# Patient Record
Sex: Male | Born: 1945
Health system: Southern US, Community
[De-identification: ages and names within clinical notes are randomized; demographics above are authoritative.]

## PROBLEM LIST (undated history)

## (undated) DIAGNOSIS — I2510 Atherosclerotic heart disease of native coronary artery without angina pectoris: Secondary | ICD-10-CM

## (undated) DIAGNOSIS — I219 Acute myocardial infarction, unspecified: Secondary | ICD-10-CM

## (undated) DIAGNOSIS — Z9889 Other specified postprocedural states: Secondary | ICD-10-CM

## (undated) DIAGNOSIS — C4490 Unspecified malignant neoplasm of skin, unspecified: Secondary | ICD-10-CM

## (undated) DIAGNOSIS — R112 Nausea with vomiting, unspecified: Secondary | ICD-10-CM

## (undated) DIAGNOSIS — N529 Male erectile dysfunction, unspecified: Secondary | ICD-10-CM

## (undated) DIAGNOSIS — C689 Malignant neoplasm of urinary organ, unspecified: Secondary | ICD-10-CM

## (undated) DIAGNOSIS — C7951 Secondary malignant neoplasm of bone: Secondary | ICD-10-CM

## (undated) DIAGNOSIS — I10 Essential (primary) hypertension: Secondary | ICD-10-CM

## (undated) DIAGNOSIS — Z8719 Personal history of other diseases of the digestive system: Secondary | ICD-10-CM

## (undated) DIAGNOSIS — J189 Pneumonia, unspecified organism: Secondary | ICD-10-CM

## (undated) DIAGNOSIS — C61 Malignant neoplasm of prostate: Secondary | ICD-10-CM

## (undated) DIAGNOSIS — G4730 Sleep apnea, unspecified: Secondary | ICD-10-CM

## (undated) DIAGNOSIS — M1990 Unspecified osteoarthritis, unspecified site: Secondary | ICD-10-CM

## (undated) DIAGNOSIS — R011 Cardiac murmur, unspecified: Secondary | ICD-10-CM

## (undated) DIAGNOSIS — I499 Cardiac arrhythmia, unspecified: Secondary | ICD-10-CM

## (undated) DIAGNOSIS — E7800 Pure hypercholesterolemia, unspecified: Secondary | ICD-10-CM

## (undated) HISTORY — DX: Malignant neoplasm of prostate: C61

## (undated) HISTORY — DX: Male erectile dysfunction, unspecified: N52.9

## (undated) HISTORY — DX: Pure hypercholesterolemia, unspecified: E78.00

## (undated) HISTORY — PX: THYROID SURGERY: SHX805

## (undated) HISTORY — DX: Unspecified malignant neoplasm of skin, unspecified: C44.90

## (undated) HISTORY — PX: WISDOM TOOTH EXTRACTION: SHX21

---

## 2005-04-20 HISTORY — PX: CATARACT EXTRACTION, BILATERAL: SHX1313

## 2005-11-10 ENCOUNTER — Encounter: Admission: RE | Admit: 2005-11-10 | Discharge: 2005-11-10 | Payer: Self-pay | Admitting: Family Medicine

## 2006-04-20 DIAGNOSIS — C4490 Unspecified malignant neoplasm of skin, unspecified: Secondary | ICD-10-CM

## 2006-04-20 HISTORY — PX: OTHER SURGICAL HISTORY: SHX169

## 2006-04-20 HISTORY — DX: Unspecified malignant neoplasm of skin, unspecified: C44.90

## 2009-06-19 ENCOUNTER — Encounter: Admission: RE | Admit: 2009-06-19 | Discharge: 2009-06-19 | Payer: Self-pay | Admitting: Family Medicine

## 2011-01-23 ENCOUNTER — Other Ambulatory Visit: Payer: Self-pay | Admitting: Family Medicine

## 2011-01-23 DIAGNOSIS — R070 Pain in throat: Secondary | ICD-10-CM

## 2011-02-02 ENCOUNTER — Other Ambulatory Visit: Payer: Self-pay

## 2011-02-12 ENCOUNTER — Other Ambulatory Visit: Payer: Self-pay | Admitting: Family Medicine

## 2011-02-12 ENCOUNTER — Ambulatory Visit
Admission: RE | Admit: 2011-02-12 | Discharge: 2011-02-12 | Disposition: A | Discharge status: Home / Self Care | Payer: BC Managed Care – PPO | Source: Ambulatory Visit | Attending: Family Medicine | Admitting: Family Medicine

## 2011-02-12 DIAGNOSIS — J45909 Unspecified asthma, uncomplicated: Secondary | ICD-10-CM

## 2011-03-27 DIAGNOSIS — C61 Malignant neoplasm of prostate: Secondary | ICD-10-CM

## 2011-03-27 HISTORY — DX: Malignant neoplasm of prostate: C61

## 2011-04-20 ENCOUNTER — Encounter: Payer: Self-pay | Admitting: *Deleted

## 2011-04-20 DIAGNOSIS — E7800 Pure hypercholesterolemia, unspecified: Secondary | ICD-10-CM | POA: Insufficient documentation

## 2011-04-20 DIAGNOSIS — E785 Hyperlipidemia, unspecified: Secondary | ICD-10-CM | POA: Insufficient documentation

## 2011-04-20 DIAGNOSIS — C4490 Unspecified malignant neoplasm of skin, unspecified: Secondary | ICD-10-CM | POA: Insufficient documentation

## 2011-04-20 DIAGNOSIS — N529 Male erectile dysfunction, unspecified: Secondary | ICD-10-CM | POA: Insufficient documentation

## 2011-04-20 NOTE — Progress Notes (Signed)
Path"03/27/11: Prostate ZOX:[id number],YNW=2.95, Gleason 3+3=6, and 3+4=7(3/12) bx cores positive Volume=35.2cc  PSA= 2006=0.9    Married, Father deceased had prostate Ca in his 14's,     Allergies:NKDA

## 2011-04-23 ENCOUNTER — Ambulatory Visit
Admission: RE | Admit: 2011-04-23 | Discharge: 2011-04-23 | Disposition: A | Discharge status: Home / Self Care | Payer: BC Managed Care – PPO | Source: Ambulatory Visit | Attending: Radiation Oncology | Admitting: Radiation Oncology

## 2011-04-23 ENCOUNTER — Encounter: Payer: Self-pay | Admitting: Radiation Oncology

## 2011-04-23 DIAGNOSIS — Z7982 Long term (current) use of aspirin: Secondary | ICD-10-CM | POA: Insufficient documentation

## 2011-04-23 DIAGNOSIS — J45909 Unspecified asthma, uncomplicated: Secondary | ICD-10-CM | POA: Insufficient documentation

## 2011-04-23 DIAGNOSIS — E7800 Pure hypercholesterolemia, unspecified: Secondary | ICD-10-CM | POA: Insufficient documentation

## 2011-04-23 DIAGNOSIS — Z85828 Personal history of other malignant neoplasm of skin: Secondary | ICD-10-CM | POA: Insufficient documentation

## 2011-04-23 DIAGNOSIS — C61 Malignant neoplasm of prostate: Secondary | ICD-10-CM | POA: Insufficient documentation

## 2011-04-23 DIAGNOSIS — M1990 Unspecified osteoarthritis, unspecified site: Secondary | ICD-10-CM | POA: Insufficient documentation

## 2011-04-23 HISTORY — DX: Unspecified osteoarthritis, unspecified site: M19.90

## 2011-04-23 NOTE — Progress Notes (Signed)
I=PSS  =0000001 Score=1, IIEF=[id number]

## 2011-04-23 NOTE — Progress Notes (Signed)
Please see the Nurse Progress Note in the MD Initial Consult Encounter for this patient. 

## 2011-04-23 NOTE — Progress Notes (Signed)
Radiation Oncology         [phone] ________________________________  Initial outpatient Consultation  Name: Tirso Laws MRN: [id number]  Date: 04/23/2011  DOB: [birth year]  CC:No primary provider on file.  Crecencio Mc, MD   REFERRING PHYSICIAN: Crecencio Mc, MD  DIAGNOSIS: 66 year old gentleman with stage TI C. adenocarcinoma prostate Gleason score 3+4 and PSA of 8.36  HISTORY OF PRESENT ILLNESS::Ovid Everage is a 66 y.o. male who was noted to have an elevated PSA of 8.36 on routine screening on August 17 with Dr. Duaine Dredge. He was consequently referred to Dr. Heloise Purpura on 02/18/2011. Digital rectal exam of that time revealed a 50 g prostate with no nodules. The patient proceeded to transrectal ultrasound with prostate biopsies in December 17. His prostate volume was measured to be 35.24 cc. A total of 12 core biopsies were obtained and 3 were positive. The highest Gleason score was 3+4 seen in 5% of the left lateral core specimens. Gleason's 3+3 was seen in 10% of the right lateral base and less than 5% of the left lateral base specimens. The patient discuss the biopsy results with Dr. Laverle Patter and has currently been referred today for consideration of potential radiation treatment  PREVIOUS RADIATION THERAPY: No  PAST MEDICAL HISTORY:  has a past medical history of Prostate cancer (03/27/2011); Asthma; Hypercholesterolemia; Skin cancer; ED (erectile dysfunction); Cataract; Arthritis; and Skin cancer (2008).    PAST SURGICAL HISTORY:No past surgical history on file.  FAMILY HISTORY: family history includes Breast cancer (age of onset:69) in his mother; Cervical cancer in his sisters; and Prostate cancer (age of onset:75) in his father.  SOCIAL HISTORY:  reports that he has never smoked. He does not have any smokeless tobacco history on file. He reports that he does not drink alcohol or use illicit drugs.  ALLERGIES: Review of patient's allergies indicates no known  allergies.  MEDICATIONS:  Current Outpatient Prescriptions  Medication Sig Dispense Refill  . albuterol (PROVENTIL) (2.5 MG/3ML) 0.083% nebulizer solution Take 2.5 mg by nebulization every 6 (six) hours as needed. 108(90 base) mcg/act prn       . aspirin 81 MG tablet Take 160 mg by mouth at bedtime.        . beclomethasone (QVAR) 80 MCG/ACT inhaler Inhale 1 puff into the lungs as needed.          REVIEW OF SYSTEMS:  A comprehensive review of systems was negative. the patient: I PSS questionnaire today reporting overall score of 1 suggesting mild urinary outflow chart of symptoms. He also filled out and IIEF queestionnaire indicating that he is not sexually active.   PHYSICAL EXAM:  height is 6' (1.829 m) and weight is 233 lb 12.8 oz (106.051 kg). His oral temperature is 97 F (36.1 C). His blood pressure is 133/77 and his pulse is 71. His respiration is 20.   The patient is in no acute distress. He is alert and oriented. Detailed physical exam was not performed today   IMPRESSION:  the patient is a very nice 66 year old gentleman with stage TI C. adenocarcinoma prostate Gleason score 3+4 and PSA of 8.36. He falls into an intermediate risk category based on his Gleason score. However, he is in a select set of intermediate patient's who may be eligible for prostate brachytherapy based on a relatively small volume of disease involvement and absence of Gleason's 4+3 disease. He is also eligible for radical prostatectomy and external beam radiotherapy.   PLAN:Today, I reviewed with the patient the findings and  workup thus far. We talked about the role of radiation treatment in the management of prostate cancer. We talked about the implications of T. stage, Gleason score, and PSA on treatment decisions as well as treatment outcomes. We discussed the rationale for external beam radiotherapy versus prostate brachytherapy and compared and contrasted these against surgery in terms of treatment effects,  potential salvage options, and followup care. We spent more than 50% of our one-hour visit in patient counseling. At the location counseling form for him with relevance treatment diagrams. At this point, the patient would like to delay any treatment until August 9 when his leave time from work resets to his full compliment of 12 weeks. At that point, he could undergo radical prostatectomy and have enough time away from work for recovery. His primary goal at this point is to cure the prostate cancer but minimize disruptions from his work schedule. We also talked some today about his home situation. The patient expressed some concern that his wife suffers with anxiety and depression and he has kept the news of his prostate cancer a secret. He would like to talk further with our psychologist in the cancer Center about how to handle his situation and how best to communicate his diagnosis with his wife. I will discuss his situation with Dr. Merry Proud.  At this point, the patient is planning to think about all the variety of treatment options over the next few months and potentially proceed with intervention in the treatment of his prostate cancer after August 9.  I enjoyed meeting the patient today and will look forward to following him on his progress.      ------------------------------------------------  Artist Pais Kathrynn Running, M.D.

## 2011-05-28 ENCOUNTER — Ambulatory Visit: Payer: BC Managed Care – PPO | Admitting: Psychiatry

## 2011-06-03 ENCOUNTER — Ambulatory Visit (INDEPENDENT_AMBULATORY_CARE_PROVIDER_SITE_OTHER): Payer: BC Managed Care – PPO | Admitting: Psychiatry

## 2011-06-03 DIAGNOSIS — F4322 Adjustment disorder with anxiety: Secondary | ICD-10-CM

## 2011-06-03 NOTE — Progress Notes (Signed)
06-03-2011  Patient seen for initial psychological evaluation.  He presents with moderate symptoms of anxiety (Adjustment Disorder with Anxiety) primarily because of concerns about his wife's reaction to his need for prostate surgery in 2011-11-26.  Her father passed away at [facility] in 2008 with an unpleasant end to his life and the patient fears this memory will trigger great anxiety and depression in his wife.  Recommended they meet with surgeon to discuss the risk/benefits of surgery and answer any questions she may have.  Also, recommended they take a vacation to help distract her in the coming months.  Patient said they could take her mother and go to [place] to visit his wife's extended family and then take a few days in June to celebrate their anniversary at [employer].  Encouraged patient to ask his wife to call me for an appointment so we could process her grief over her father's death and increase her coping skills to manage his illness.  Patient was agreeable to all recommendations. No further appointment was scheduled at this time but the patient was encouraged to call his he had any further questions or concerns.

## 2011-07-15 ENCOUNTER — Other Ambulatory Visit: Payer: Self-pay | Admitting: Urology

## 2011-07-28 ENCOUNTER — Encounter (HOSPITAL_COMMUNITY)
Admission: RE | Admit: 2011-07-28 | Discharge: 2011-07-28 | Disposition: A | Discharge status: Home / Self Care | Payer: BC Managed Care – PPO | Source: Ambulatory Visit | Attending: Urology | Admitting: Urology

## 2011-07-28 ENCOUNTER — Encounter (HOSPITAL_COMMUNITY): Payer: Self-pay | Admitting: Pharmacy Technician

## 2011-07-28 ENCOUNTER — Encounter (HOSPITAL_COMMUNITY): Payer: Self-pay

## 2011-07-28 HISTORY — DX: Cardiac arrhythmia, unspecified: I49.9

## 2011-07-28 HISTORY — DX: Personal history of other diseases of the digestive system: Z87.19

## 2011-07-28 HISTORY — DX: Sleep apnea, unspecified: G47.30

## 2011-07-28 HISTORY — DX: Essential (primary) hypertension: I10

## 2011-07-28 LAB — CBC
Hemoglobin: 14.2 g/dL (ref 13.0–17.0)
Platelets: 241 10*3/uL (ref 150–400)
RDW: 12.9 % (ref 11.5–15.5)

## 2011-07-28 LAB — BASIC METABOLIC PANEL
CO2: 25 mEq/L (ref 19–32)
Calcium: 9.1 mg/dL (ref 8.4–10.5)
Chloride: 101 mEq/L (ref 96–112)
Creatinine, Ser: 1.02 mg/dL (ref 0.50–1.35)
GFR calc Af Amer: 86 mL/min — ABNORMAL LOW (ref >90)
GFR calc non Af Amer: 74 mL/min — ABNORMAL LOW (ref >90)
Glucose, Bld: 102 mg/dL — ABNORMAL HIGH (ref 70–99)
Potassium: 3.8 mEq/L (ref 3.5–5.1)

## 2011-07-28 LAB — SURGICAL PCR SCREEN
MRSA, PCR: NEGATIVE
Staphylococcus aureus: NEGATIVE

## 2011-07-28 NOTE — Patient Instructions (Addendum)
20 Lawrence Hall  07/28/2011   Your procedure is scheduled on:  08/10/11  Report to SHORT STAY DEPT  At 8:30 AM.  Call this number if you have problems the morning of surgery: [phone]   Remember:   Do not eat food or drink liquids AFTER MIDNIGHT    Take these medicines the morning of surgery with A SIP OF WATER: NONE   Do not wear jewelry, make-up or nail polish.  Do not wear lotions, powders, or perfumes.   Do not shave legs or underarms 48 hrs. before surgery (men may shave face)  Do not bring valuables to the hospital.  Contacts, dentures or bridgework may not be worn into surgery.  Leave suitcase in the car. After surgery it may be brought to your room.  For patients admitted to the hospital, checkout time is 11:00 AM the day of discharge.   Patients discharged the day of surgery will not be allowed to drive home.  Name and phone number of your driver:   Special Instructions:   Please read over the following fact sheets that you were given: MRSA  Information / Incentive Spirometer               SHOWER WITH BETASEPT THE NIGHT BEFORE SURGERY AND THE MORNING OF SURGERY               BRING NASAL EXPANDERS TO HOSPITAL              FOLLOW BOWEL  PREP

## 2011-08-07 NOTE — H&P (Signed)
  History of Present Illness  Mr. Nettle is a 66 year old who was noted to have an elevated PSA of 8.36 which prompted a prostate biopsy on 03/27/11.  This confirmed Gleason 3+4=7 adenocarcinoma of the prostate with 3 out of 12 biopsies cores positive for malignancy. He has a paternal family history of prostate cancer. He is is good overall health.   TNM stage: cT1c Nx Mx PSA: 8.36 Gleason score: 3+4=7 Biopsy (03/27/11): 3/12 cores    Left - L lateral mid (5%, 3+4=7), L lateral base (< 5%, 3+3=6)    Right - R lateral base (10%, 3+3=6, PNI) Prostate volume: 35.2 cc  Nomogram: OC disease: 70% EPE: 19% SVI: 7% LNI: 2.8% PFS: 92%, 88%  Urinary function: He denies bothersome symptoms. IPSS: 1. Erectile function: He has moderate erectile dysfunction. SHIM score: 16.  Interval history:  He follows up today with his wife in preparation for his upcoming surgery for treatment of prostate cancer. He has additional questions today and otherwise is ready to proceed with treatment after making a final decision to proceed with surgical therapy.     Past Medical History Problems  1. History of  Asthma 493.90 2. History of  Hypercholesterolemia 272.0 3. History of  Skin Cancer V10.83  Surgical History Problems  1. History of  Biopsy Skin  Current Meds 1. ProAir HFA 108 (90 Base) MCG/ACT Inhalation Aerosol Solution; Therapy: 01Oct2012 to 2. Qvar 80 MCG/ACT Inhalation Aerosol Solution; Therapy: 01Oct2012 to  Allergies Medication  1. No Known Drug Allergies  Family History Problems  1. Family history of  Cancer  Social History Problems  1. Marital History - Currently Married 2. Never A Smoker 3. Occupation: Clinical biochemist for [employer] Denied  4. History of  Alcohol Use  Physical Exam Constitutional: Well nourished and well developed . No acute distress.  Pulmonary: No respiratory distress and normal respiratory rhythm and effort.  Cardiovascular: Heart rate and rhythm are  normal . No peripheral edema.    Results/Data Urine [Data Includes: Last 1 Day]   12Apr2013  COLOR STRAW   APPEARANCE CLEAR   SPECIFIC GRAVITY >1.030   pH 5.5   GLUCOSE NEG mg/dL  BILIRUBIN NEG   KETONE NEG mg/dL  BLOOD NEG   PROTEIN NEG mg/dL  UROBILINOGEN 0.2 mg/dL  NITRITE NEG   LEUKOCYTE ESTERASE NEG    Assessment  Prostate Cancer 185    Plan  Health Maintenance (V70.0)  1. UA With REFLEX  Done: 12Apr2013 03:42PM Prostate Cancer (185)  2. Follow-up Office  Follow-up  Done: 12Apr2013        Discussion/Summary 1. Prostate cancer: Mr. Myrie has elected to proceed with surgical treatment of his prostate cancer and has been scheduled for a robotic-assisted laparoscopic radical prostatectomy and pelvic lymphadenectomy. All of his questions have been answered to his stated satisfaction today. We have previously discussed the potential risks and complications as well as expected recovery process associated with the surgery. We have reviewed that information today. He gives his informed consent to proceed.  CC: Dr. Mosetta Putt   A total of 35 minutes were spent in the overall care of the patient today with 30 minutes in direct face to face consultation.    Signatures Electronically signed by : Heloise Purpura, M.D.; Jul 31 2011  5:07PM

## 2011-08-10 ENCOUNTER — Encounter (HOSPITAL_COMMUNITY)
Admission: RE | Disposition: A | Discharge status: Home / Self Care | Payer: Self-pay | Source: Ambulatory Visit | Attending: Urology

## 2011-08-10 ENCOUNTER — Inpatient Hospital Stay (HOSPITAL_COMMUNITY)
Admission: RE | Admit: 2011-08-10 | Discharge: 2011-08-11 | DRG: 335 | Disposition: A | Discharge status: Home / Self Care | Payer: BC Managed Care – PPO | Source: Ambulatory Visit | Attending: Urology | Admitting: Urology

## 2011-08-10 ENCOUNTER — Encounter (HOSPITAL_COMMUNITY): Payer: Self-pay | Admitting: *Deleted

## 2011-08-10 ENCOUNTER — Encounter (HOSPITAL_COMMUNITY): Payer: Self-pay | Admitting: Anesthesiology

## 2011-08-10 ENCOUNTER — Ambulatory Visit (HOSPITAL_COMMUNITY): Payer: BC Managed Care – PPO | Admitting: Anesthesiology

## 2011-08-10 DIAGNOSIS — Z85828 Personal history of other malignant neoplasm of skin: Secondary | ICD-10-CM

## 2011-08-10 DIAGNOSIS — J45909 Unspecified asthma, uncomplicated: Secondary | ICD-10-CM | POA: Diagnosis present

## 2011-08-10 DIAGNOSIS — Z01812 Encounter for preprocedural laboratory examination: Secondary | ICD-10-CM

## 2011-08-10 DIAGNOSIS — C61 Malignant neoplasm of prostate: Principal | ICD-10-CM | POA: Diagnosis present

## 2011-08-10 DIAGNOSIS — E7800 Pure hypercholesterolemia, unspecified: Secondary | ICD-10-CM | POA: Diagnosis present

## 2011-08-10 HISTORY — PX: ROBOT ASSISTED LAPAROSCOPIC RADICAL PROSTATECTOMY: SHX5141

## 2011-08-10 LAB — TYPE AND SCREEN
ABO/RH(D): A POS
Antibody Screen: NEGATIVE

## 2011-08-10 LAB — HEMOGLOBIN AND HEMATOCRIT, BLOOD: Hemoglobin: 12.9 g/dL — ABNORMAL LOW (ref 13.0–17.0)

## 2011-08-10 SURGERY — ROBOTIC ASSISTED LAPAROSCOPIC RADICAL PROSTATECTOMY LEVEL 2
Anesthesia: General | Wound class: Clean Contaminated

## 2011-08-10 MED ORDER — PROMETHAZINE HCL 25 MG/ML IJ SOLN
6.2500 mg | INTRAMUSCULAR | Status: DC | PRN
  Administered 2011-08-10: 6.25 mg via INTRAVENOUS

## 2011-08-10 MED ORDER — HYDROMORPHONE HCL PF 1 MG/ML IJ SOLN
0.2500 mg | INTRAMUSCULAR | Status: DC | PRN
  Administered 2011-08-10: 0.5 mg via INTRAVENOUS

## 2011-08-10 MED ORDER — MEPERIDINE HCL 50 MG/ML IJ SOLN: 6.2500 mg | INTRAMUSCULAR | Status: DC | PRN

## 2011-08-10 MED ORDER — SODIUM CHLORIDE 0.9 % IV BOLUS (SEPSIS)
1000.0000 mL | Freq: Once | INTRAVENOUS | Status: AC
  Administered 2011-08-10: 1000 mL via INTRAVENOUS

## 2011-08-10 MED ORDER — ROCURONIUM BROMIDE 100 MG/10ML IV SOLN
INTRAVENOUS | Status: DC | PRN
  Administered 2011-08-10: 20 mg via INTRAVENOUS
  Administered 2011-08-10: 10 mg via INTRAVENOUS
  Administered 2011-08-10: 60 mg via INTRAVENOUS

## 2011-08-10 MED ORDER — NEOSTIGMINE METHYLSULFATE 1 MG/ML IJ SOLN
INTRAMUSCULAR | Status: DC | PRN
  Administered 2011-08-10: 4 mg via INTRAVENOUS

## 2011-08-10 MED ORDER — SODIUM CHLORIDE 0.9 % IR SOLN
Status: DC | PRN
  Administered 2011-08-10: 1000 mL

## 2011-08-10 MED ORDER — KETOROLAC TROMETHAMINE 15 MG/ML IJ SOLN
15.0000 mg | Freq: Four times a day (QID) | INTRAMUSCULAR | Status: DC
  Administered 2011-08-10 – 2011-08-11 (×4): 15 mg via INTRAVENOUS
  Filled 2011-08-10 (×5): qty 1

## 2011-08-10 MED ORDER — CEFAZOLIN SODIUM-DEXTROSE 2-3 GM-% IV SOLR
INTRAVENOUS | Status: AC
  Filled 2011-08-10: qty 50

## 2011-08-10 MED ORDER — DIPHENHYDRAMINE HCL 12.5 MG/5ML PO ELIX: 12.5000 mg | ORAL_SOLUTION | Freq: Four times a day (QID) | ORAL | Status: DC | PRN

## 2011-08-10 MED ORDER — MIDAZOLAM HCL 5 MG/5ML IJ SOLN
INTRAMUSCULAR | Status: DC | PRN
  Administered 2011-08-10: 2 mg via INTRAVENOUS

## 2011-08-10 MED ORDER — LIDOCAINE HCL (CARDIAC) 20 MG/ML IV SOLN
INTRAVENOUS | Status: DC | PRN
  Administered 2011-08-10: 75 mg via INTRAVENOUS

## 2011-08-10 MED ORDER — LACTATED RINGERS IV SOLN
INTRAVENOUS | Status: DC
  Administered 2011-08-10: 1000 mL via INTRAVENOUS
  Administered 2011-08-10: 14:00:00 via INTRAVENOUS

## 2011-08-10 MED ORDER — BIOTENE DRY MOUTH MT LIQD
15.0000 mL | Freq: Two times a day (BID) | OROMUCOSAL | Status: DC
  Administered 2011-08-10 – 2011-08-11 (×2): 15 mL via OROMUCOSAL

## 2011-08-10 MED ORDER — DIPHENHYDRAMINE HCL 50 MG/ML IJ SOLN: 12.5000 mg | Freq: Four times a day (QID) | INTRAMUSCULAR | Status: DC | PRN

## 2011-08-10 MED ORDER — KCL IN DEXTROSE-NACL 20-5-0.45 MEQ/L-%-% IV SOLN
INTRAVENOUS | Status: DC
  Administered 2011-08-10: 1000 mL via INTRAVENOUS
  Filled 2011-08-10 (×5): qty 1000

## 2011-08-10 MED ORDER — ONDANSETRON HCL 4 MG/2ML IJ SOLN
INTRAMUSCULAR | Status: DC | PRN
  Administered 2011-08-10: 4 mg via INTRAVENOUS

## 2011-08-10 MED ORDER — ONDANSETRON HCL 4 MG/2ML IJ SOLN: 4.0000 mg | INTRAMUSCULAR | Status: DC | PRN

## 2011-08-10 MED ORDER — PNEUMOCOCCAL VAC POLYVALENT 25 MCG/0.5ML IJ INJ
0.5000 mL | INJECTION | INTRAMUSCULAR | Status: DC
  Filled 2011-08-10: qty 0.5

## 2011-08-10 MED ORDER — FENTANYL CITRATE 0.05 MG/ML IJ SOLN
INTRAMUSCULAR | Status: DC | PRN
  Administered 2011-08-10: 100 ug via INTRAVENOUS
  Administered 2011-08-10 (×3): 50 ug via INTRAVENOUS

## 2011-08-10 MED ORDER — ACETAMINOPHEN 10 MG/ML IV SOLN
INTRAVENOUS | Status: DC | PRN
  Administered 2011-08-10: 1000 mg via INTRAVENOUS

## 2011-08-10 MED ORDER — CIPROFLOXACIN HCL 500 MG PO TABS: 500.0000 mg | ORAL_TABLET | Freq: Two times a day (BID) | ORAL | Status: AC

## 2011-08-10 MED ORDER — ACETAMINOPHEN 325 MG PO TABS: 650.0000 mg | ORAL_TABLET | ORAL | Status: DC | PRN

## 2011-08-10 MED ORDER — ADULT MULTIVITAMIN W/MINERALS CH
1.0000 | ORAL_TABLET | Freq: Every day | ORAL | Status: DC
  Administered 2011-08-11: 1 via ORAL
  Filled 2011-08-10 (×3): qty 1

## 2011-08-10 MED ORDER — PROMETHAZINE HCL 25 MG/ML IJ SOLN
INTRAMUSCULAR | Status: AC
  Filled 2011-08-10: qty 1

## 2011-08-10 MED ORDER — CEFAZOLIN SODIUM 1-5 GM-% IV SOLN
1.0000 g | Freq: Three times a day (TID) | INTRAVENOUS | Status: AC
  Administered 2011-08-10 – 2011-08-11 (×2): 1 g via INTRAVENOUS
  Filled 2011-08-10 (×2): qty 50

## 2011-08-10 MED ORDER — KETOROLAC TROMETHAMINE 15 MG/ML IJ SOLN
INTRAMUSCULAR | Status: AC
  Filled 2011-08-10: qty 1

## 2011-08-10 MED ORDER — HYDROMORPHONE HCL PF 1 MG/ML IJ SOLN
INTRAMUSCULAR | Status: DC | PRN
  Administered 2011-08-10: 0.5 mg via INTRAVENOUS
  Administered 2011-08-10: 1 mg via INTRAVENOUS
  Administered 2011-08-10: 0.5 mg via INTRAVENOUS

## 2011-08-10 MED ORDER — ALBUTEROL SULFATE HFA 108 (90 BASE) MCG/ACT IN AERS
2.0000 | INHALATION_SPRAY | Freq: Four times a day (QID) | RESPIRATORY_TRACT | Status: DC | PRN
  Filled 2011-08-10: qty 6.7

## 2011-08-10 MED ORDER — BUPIVACAINE-EPINEPHRINE PF 0.25-1:200000 % IJ SOLN
INTRAMUSCULAR | Status: AC
  Filled 2011-08-10: qty 30

## 2011-08-10 MED ORDER — HYDROCODONE-ACETAMINOPHEN 5-325 MG PO TABS: 1.0000 | ORAL_TABLET | Freq: Four times a day (QID) | ORAL | Status: AC | PRN

## 2011-08-10 MED ORDER — LORATADINE 10 MG PO TABS
10.0000 mg | ORAL_TABLET | Freq: Every day | ORAL | Status: DC | PRN
  Filled 2011-08-10: qty 1

## 2011-08-10 MED ORDER — PROPOFOL 10 MG/ML IV BOLUS
INTRAVENOUS | Status: DC | PRN
  Administered 2011-08-10: 200 mg via INTRAVENOUS

## 2011-08-10 MED ORDER — HEPARIN SODIUM (PORCINE) 1000 UNIT/ML IJ SOLN
INTRAMUSCULAR | Status: AC
  Filled 2011-08-10: qty 1

## 2011-08-10 MED ORDER — GLYCOPYRROLATE 0.2 MG/ML IJ SOLN
INTRAMUSCULAR | Status: DC | PRN
  Administered 2011-08-10: .5 mg via INTRAVENOUS

## 2011-08-10 MED ORDER — HYDROMORPHONE HCL PF 1 MG/ML IJ SOLN
INTRAMUSCULAR | Status: AC
  Filled 2011-08-10: qty 1

## 2011-08-10 MED ORDER — KCL IN DEXTROSE-NACL 20-5-0.45 MEQ/L-%-% IV SOLN
INTRAVENOUS | Status: AC
  Administered 2011-08-10: 1000 mL via INTRAVENOUS
  Filled 2011-08-10: qty 1000

## 2011-08-10 MED ORDER — DOCUSATE SODIUM 100 MG PO CAPS
100.0000 mg | ORAL_CAPSULE | Freq: Two times a day (BID) | ORAL | Status: DC
  Administered 2011-08-10 – 2011-08-11 (×2): 100 mg via ORAL
  Filled 2011-08-10 (×5): qty 1

## 2011-08-10 MED ORDER — INDIGOTINDISULFONATE SODIUM 8 MG/ML IJ SOLN
INTRAMUSCULAR | Status: DC | PRN
  Administered 2011-08-10 (×2): 40 mg via INTRAVENOUS

## 2011-08-10 MED ORDER — INDIGOTINDISULFONATE SODIUM 8 MG/ML IJ SOLN
INTRAMUSCULAR | Status: AC
  Filled 2011-08-10: qty 10

## 2011-08-10 MED ORDER — VITAMIN D3 25 MCG (1000 UNIT) PO TABS
1000.0000 [IU] | ORAL_TABLET | Freq: Every day | ORAL | Status: DC
  Administered 2011-08-11: 1000 [IU] via ORAL
  Filled 2011-08-10 (×3): qty 1

## 2011-08-10 MED ORDER — MORPHINE SULFATE 2 MG/ML IJ SOLN: 2.0000 mg | INTRAMUSCULAR | Status: DC | PRN

## 2011-08-10 MED ORDER — ACETAMINOPHEN 10 MG/ML IV SOLN
INTRAVENOUS | Status: AC
  Filled 2011-08-10: qty 100

## 2011-08-10 MED ORDER — LACTATED RINGERS IR SOLN
Status: DC | PRN
  Administered 2011-08-10: 1000 mL

## 2011-08-10 MED ORDER — LACTATED RINGERS IV SOLN: INTRAVENOUS | Status: DC

## 2011-08-10 MED ORDER — CEFAZOLIN SODIUM-DEXTROSE 2-3 GM-% IV SOLR
2.0000 g | Freq: Once | INTRAVENOUS | Status: AC
  Administered 2011-08-10: 2 g via INTRAVENOUS

## 2011-08-10 MED ORDER — HEPARIN SODIUM (PORCINE) 1000 UNIT/ML IJ SOLN
INTRAMUSCULAR | Status: DC | PRN
  Administered 2011-08-10: 1000 [IU]

## 2011-08-10 MED ORDER — BUPIVACAINE-EPINEPHRINE 0.25% -1:200000 IJ SOLN
INTRAMUSCULAR | Status: DC | PRN
  Administered 2011-08-10: 30 mL

## 2011-08-10 SURGICAL SUPPLY — 37 items
CANISTER SUCTION 2500CC (MISCELLANEOUS) ×3 IMPLANT
CATH ROBINSON RED A/P 8FR (CATHETERS) ×3 IMPLANT
CHLORAPREP W/TINT 26ML (MISCELLANEOUS) ×3 IMPLANT
CLIP LIGATING HEM O LOK PURPLE (MISCELLANEOUS) ×9 IMPLANT
CLOTH BEACON ORANGE TIMEOUT ST (SAFETY) ×3 IMPLANT
CORD HIGH FREQUENCY UNIPOLAR (ELECTROSURGICAL) ×6 IMPLANT
COVER SURGICAL LIGHT HANDLE (MISCELLANEOUS) ×3 IMPLANT
COVER TIP SHEARS 8 DVNC (MISCELLANEOUS) ×2 IMPLANT
COVER TIP SHEARS 8MM DA VINCI (MISCELLANEOUS) ×1
CUTTER ECHEON FLEX ENDO 45 340 (ENDOMECHANICALS) ×3 IMPLANT
DECANTER SPIKE VIAL GLASS SM (MISCELLANEOUS) IMPLANT
DRAPE SURG IRRIG POUCH 19X23 (DRAPES) ×3 IMPLANT
DRSG TEGADERM 6X8 (GAUZE/BANDAGES/DRESSINGS) ×6 IMPLANT
ELECT REM PT RETURN 9FT ADLT (ELECTROSURGICAL) ×3
ELECTRODE REM PT RTRN 9FT ADLT (ELECTROSURGICAL) ×2 IMPLANT
GLOVE BIO SURGEON STRL SZ 6.5 (GLOVE) ×3 IMPLANT
GLOVE BIOGEL M STRL SZ7.5 (GLOVE) ×12 IMPLANT
GOWN STRL NON-REIN LRG LVL3 (GOWN DISPOSABLE) ×9 IMPLANT
GOWN STRL REIN XL XLG (GOWN DISPOSABLE) ×3 IMPLANT
HOLDER FOLEY CATH W/STRAP (MISCELLANEOUS) ×3 IMPLANT
IV LACTATED RINGERS 1000ML (IV SOLUTION) ×3 IMPLANT
KIT ACCESSORY DA VINCI DISP (KITS) ×1
KIT ACCESSORY DVNC DISP (KITS) ×2 IMPLANT
NDL SAFETY ECLIPSE 18X1.5 (NEEDLE) ×2 IMPLANT
NEEDLE HYPO 18GX1.5 SHARP (NEEDLE) ×1
PACK ROBOT UROLOGY CUSTOM (CUSTOM PROCEDURE TRAY) ×3 IMPLANT
RELOAD GREEN ECHELON 45 (STAPLE) ×3 IMPLANT
SET TUBE IRRIG SUCTION NO TIP (IRRIGATION / IRRIGATOR) ×3 IMPLANT
SOLUTION ELECTROLUBE (MISCELLANEOUS) ×3 IMPLANT
SPONGE GAUZE 4X4 12PLY (GAUZE/BANDAGES/DRESSINGS) ×3 IMPLANT
SUT MNCRL 3 0 VIOLET RB1 (SUTURE) ×4 IMPLANT
SUT MONOCRYL 3 0 RB1 (SUTURE) ×2
SUT VICRYL 0 UR6 27IN ABS (SUTURE) ×6 IMPLANT
SYR 27GX1/2 1ML LL SAFETY (SYRINGE) ×3 IMPLANT
TOWEL OR 17X26 10 PK STRL BLUE (TOWEL DISPOSABLE) ×3 IMPLANT
TOWEL OR NON WOVEN STRL DISP B (DISPOSABLE) ×3 IMPLANT
WATER STERILE IRR 1500ML POUR (IV SOLUTION) ×6 IMPLANT

## 2011-08-10 NOTE — Transfer of Care (Signed)
Immediate Anesthesia Transfer of Care Note  Patient: Lawrence Hall  Procedure(s) Performed: Procedure(s) (LRB): ROBOTIC ASSISTED LAPAROSCOPIC RADICAL PROSTATECTOMY LEVEL 2 (N/A) LYMPHADENECTOMY (Bilateral)  Patient Location: PACU  Anesthesia Type: General  Level of Consciousness: awake, sedated and patient cooperative  Airway & Oxygen Therapy: Patient Spontanous Breathing and Patient connected to face mask oxygen  Post-op Assessment: Report given to PACU RN and Post -op Vital signs reviewed and stable  Post vital signs: Reviewed and stable  Complications: No apparent anesthesia complications

## 2011-08-10 NOTE — Discharge Instructions (Signed)
1. Activity:  You are encouraged to ambulate frequently (about every hour during waking hours) to help prevent blood clots from forming in your legs or lungs.  However, you should not engage in any heavy lifting (> 10-15 lbs), strenuous activity, or straining. °2. Diet: You should continue a clear liquid diet until passing gas from below.  Once this occurs, you may advance your diet to a soft diet that would be easy to digest (i.e soups, scrambled eggs, mashed potatoes, etc.) for 24 hours just as you would if getting over a bad stomach flu.  If tolerating this diet well for 24 hours, you may then begin eating regular food.  It will be normal to have some amount of bloating, nausea, and abdominal discomfort intermittently. °3. Prescriptions:  You will be provided a prescription for pain medication to take as needed.  If your pain is not severe enough to require the prescription pain medication, you may take extra strength Tylenol instead.  You should also take an over the counter stool softener (Colace 100 mg twice daily) to avoid straining with bowel movements as the pain medication may constipate you. Finally, you will also be provided a prescription for an antibiotic to begin the day prior to your return visit in the office for catheter removal. °4. Catheter care: You will be taught how to take care of the catheter by the nursing staff prior to discharge from the hospital.  You may use both a leg bag and the larger bedside bag but it is recommended to at least use the bigger bedside bag at nighttime as the leg bag is small and will fill up overnight and also does not drain as well when lying flat. You may periodically feel a strong urge to void with the catheter in place.  This is a bladder spasm and most often can occur when having a bowel movement or when you are moving around. It is typically self-limited and usually will stop after a few minutes.  You may use some Vaseline or Neosporin around the tip of the  catheter to reduce friction at the tip of the penis. °5. Incisions: You may remove your dressing bandages the 2nd day after surgery.  You most likely will have a few small staples in each of the incisions and once the bandages are removed, the incisions may stay open to air.  You may start showering (not soaking or bathing in water) 48 hours after surgery and the incisions simply need to be patted dry after the shower.  No additional care is needed. °6. What to call us about: You should call the office ([phone]) if you develop fever > 101, persistent vomiting, or the catheter stops draining. Also, feel free to call with any other questions you may have and remember the handout that was provided to you as a reference preoperatively which answers many of the common questions that arise after surgery. ° °You may resume aspirin and vitamins 7 days after surgery. °

## 2011-08-10 NOTE — Progress Notes (Signed)
Patient ID: Pratt Bress, male   DOB: June 21, 1945, 66 y.o.   MRN: [id number]  Post-op note  Subjective: The patient is doing well.  No complaints.  Objective: Vital signs in last 24 hours: Temp:  [97 F (36.1 C)-98.4 F (36.9 C)] 98.4 F (36.9 C) (04/22 1851) Pulse Rate:  [69-88] 82  (04/22 1851) Resp:  [14-18] 16  (04/22 1851) BP: (138-199)/(76-121) 150/77 mmHg (04/22 1851) SpO2:  [93 %-100 %] 96 % (04/22 1851) Weight:  [103.647 kg (228 lb 8 oz)] 103.647 kg (228 lb 8 oz) (04/22 1545)  Intake/Output from previous day:   Intake/Output this shift:    Physical Exam:  General: Alert and oriented. Abdomen: Soft, Nondistended. Incisions: Clean and dry. GU: Urine light red.  Lab Results:  Basename 08/10/11 1452  HGB 12.9*  HCT 37.2*    Assessment/Plan: POD#0   1) Continue to monitor   [name] MD   LOS: 0 days   Priti Consoli,LES 08/10/2011, 7:04 PM

## 2011-08-10 NOTE — Anesthesia Preprocedure Evaluation (Addendum)
Anesthesia Evaluation  Patient identified by MRN, date of birth, ID band Patient awake    Reviewed: Allergy & Precautions, H&P , NPO status , Patient's Chart, lab work & pertinent test results  Airway Mallampati: III TM Distance: >3 FB Neck ROM: Full    Dental No notable dental hx. (+) Lower Dentures   Pulmonary neg pulmonary ROS, asthma , sleep apnea ,  breath sounds clear to auscultation  Pulmonary exam normal       Cardiovascular hypertension, Pt. on medications negative cardio ROS  - dysrhythmias Rhythm:Regular Rate:Normal     Neuro/Psych negative neurological ROS  negative psych ROS   GI/Hepatic negative GI ROS, Neg liver ROS, hiatal hernia,   Endo/Other  negative endocrine ROS  Renal/GU negative Renal ROS  negative genitourinary   Musculoskeletal negative musculoskeletal ROS (+)   Abdominal   Peds negative pediatric ROS (+)  Hematology negative hematology ROS (+)   Anesthesia Other Findings   Reproductive/Obstetrics negative OB ROS                          Anesthesia Physical Anesthesia Plan  ASA: II  Anesthesia Plan: General   Post-op Pain Management:    Induction: Intravenous  Airway Management Planned: Oral ETT  Additional Equipment:   Intra-op Plan:   Post-operative Plan: Extubation in OR  Informed Consent: I have reviewed the patients History and Physical, chart, labs and discussed the procedure including the risks, benefits and alternatives for the proposed anesthesia with the patient or authorized representative who has indicated his/her understanding and acceptance.   Dental advisory given  Plan Discussed with: CRNA  Anesthesia Plan Comments:         Anesthesia Quick Evaluation

## 2011-08-10 NOTE — Interval H&P Note (Signed)
History and Physical Interval Note:  08/10/2011 11:04 AM  Lawrence Hall  has presented today for surgery, with the diagnosis of PROSTATE CANCER  The various methods of treatment have been discussed with the patient and family. After consideration of risks, benefits and other options for treatment, the patient has consented to  Procedure(s) (LRB): ROBOTIC ASSISTED LAPAROSCOPIC RADICAL PROSTATECTOMY LEVEL 2 (N/A) LYMPHADENECTOMY (Bilateral) as a surgical intervention .  The patients' history has been reviewed, patient examined, no change in status, stable for surgery.  Questions were answered to the patient's satisfaction.     Vivion Romano,LES

## 2011-08-10 NOTE — Op Note (Signed)
Preoperative diagnosis: Clinically localized adenocarcinoma of the prostate (clinical stage cT1c Nx Mx)  Postoperative diagnosis: Clinically localized adenocarcinoma of the prostate (clinical stage cT1c Nx Mx)  Procedure:  1. Robotic assisted laparoscopic radical prostatectomy (bilateral nerve sparing) 2. Bilateral robotic assisted laparoscopic pelvic lymphadenectomy  Surgeon: [name] M.D.  Assistant: Pecola Leisure, PA-C  Anesthesia: General  Complications: None  EBL: 150 mL  IVF:  1700 mL crystalloid  Specimens: 1. Prostate and seminal vesicles 2. Right pelvic lymph nodes 3. Left pelvic lymph nodes  Disposition of specimens: Pathology  Drains: 1. 20 Fr coude catheter 2. # 19 Blake pelvic drain  Indication: Lawrence Hall is a 66 y.o. year old patient with clinically localized prostate cancer.  After a thorough review of the management options for treatment of prostate cancer, he elected to proceed with surgical therapy and the above procedure(s).  We have discussed the potential benefits and risks of the procedure, side effects of the proposed treatment, the likelihood of the patient achieving the goals of the procedure, and any potential problems that might occur during the procedure or recuperation. Informed consent has been obtained.  Description of procedure:  The patient was taken to the operating room and a general anesthetic was administered. He was given preoperative antibiotics, placed in the dorsal lithotomy position, and prepped and draped in the usual sterile fashion. Next a preoperative timeout was performed. A urethral catheter was placed into the bladder and a site was selected near the umbilicus for placement of the camera port. This was placed using a standard open Hassan technique which allowed entry into the peritoneal cavity under direct vision and without difficulty. A 12 mm port was placed and a pneumoperitoneum established. The camera was then  used to inspect the abdomen and there was no evidence of any intra-abdominal injuries or other abnormalities. The remaining abdominal ports were then placed. 8 mm robotic ports were placed in the right lower quadrant, left lower quadrant, and far left lateral abdominal wall. A 5 mm port was placed in the right upper quadrant and a 12 mm port was placed in the right lateral abdominal wall for laparoscopic assistance. All ports were placed under direct vision without difficulty. The surgical cart was then docked.   Utilizing the cautery scissors, the bladder was reflected posteriorly allowing entry into the space of Retzius and identification of the endopelvic fascia and prostate. The periprostatic fat was then removed from the prostate allowing full exposure of the endopelvic fascia. The endopelvic fascia was then incised from the apex back to the base of the prostate bilaterally and the underlying levator muscle fibers were swept laterally off the prostate thereby isolating the dorsal venous complex. The dorsal vein was then stapled and divided with a 45 mm Flex Echelon stapler. Attention then turned to the bladder neck which was divided anteriorly thereby allowing entry into the bladder and exposure of the urethral catheter. The catheter balloon was deflated and the catheter was brought into the operative field and used to retract the prostate anteriorly. The posterior bladder neck was then examined and was divided allowing further dissection between the bladder and prostate posteriorly until the vasa deferentia and seminal vessels were identified. The vasa deferentia were isolated, divided, and lifted anteriorly. The seminal vesicles were dissected down to their tips with care to control the seminal vascular arterial blood supply. The right seminal vesicle was noted to be very adherent to the surrounding tissue compared to the left which was easily dissected.  These structures were then lifted anteriorly and the  space between Denonvillier's fascia and the anterior rectum was developed with a combination of sharp and blunt dissection. This isolated the vascular pedicles of the prostate.  The lateral prostatic fascia was then sharply incised allowing release of the neurovascular bundles bilaterally. The vascular pedicles of the prostate were then ligated with Weck clips between the prostate and neurovascular bundles and divided with sharp cold scissor dissection resulting in neurovascular bundle preservation. The neurovascular bundles were then separated off the apex of the prostate and urethra bilaterally.  The urethra was then sharply transected allowing the prostate specimen to be disarticulated. The pelvis was copiously irrigated and hemostasis was ensured. There were large periprostatic veins which needed to be sharply divided during the dissection.  These were controlled with figure of 8 3-0 monocryl sutures. There was no evidence for rectal injury.  Attention then turned to the right pelvic sidewall. The fibrofatty tissue between the external iliac vein, confluence of the iliac vessels, hypogastric artery, and Cooper's ligament was dissected free from the pelvic sidewall with care to preserve the obturator nerve. Weck clips were used for lymphostasis and hemostasis. An identical procedure was performed on the contralateral side and the lymphatic packets were removed for permanent pathologic analysis.  Attention then turned to the urethral anastomosis. A 2-0 Vicryl slip knot was placed between Denonvillier's fascia, the posterior bladder neck, and the posterior urethra to reapproximate these structures. A double-armed 3-0 Monocryl suture was then used to perform a 360 running tension-free anastomosis between the bladder neck and urethra. A new urethral catheter was then placed into the bladder and irrigated. There were no blood clots within the bladder and the anastomosis appeared to be watertight. A #19 Blake  drain was then brought through the left lateral 8 mm port site and positioned appropriately within the pelvis. It was secured to the skin with a nylon suture. The surgical cart was then undocked. The right lateral 12 mm port site was closed at the fascial level with a 0 Vicryl suture placed laparoscopically. All remaining ports were then removed under direct vision. The prostate specimen was removed intact within the Endopouch retrieval bag via the periumbilical camera port site. This fascial opening was closed with two running 0 Vicryl sutures. 0.25% Marcaine was then injected into all port sites and all incisions were reapproximated at the skin level with staples. Sterile dressings were applied. The patient appeared to tolerate the procedure well and without complications. The patient was able to be extubated and transferred to the recovery unit in satisfactory condition.   Moody Bruins MD

## 2011-08-11 LAB — HEMOGLOBIN AND HEMATOCRIT, BLOOD
HCT: 36.9 % — ABNORMAL LOW (ref 39.0–52.0)
Hemoglobin: 12.4 g/dL — ABNORMAL LOW (ref 13.0–17.0)

## 2011-08-11 MED ORDER — HYDROCODONE-ACETAMINOPHEN 5-325 MG PO TABS: 1.0000 | ORAL_TABLET | Freq: Four times a day (QID) | ORAL | Status: DC | PRN

## 2011-08-11 MED ORDER — BISACODYL 10 MG RE SUPP
10.0000 mg | Freq: Once | RECTAL | Status: AC
  Administered 2011-08-11: 10 mg via RECTAL
  Filled 2011-08-11: qty 1

## 2011-08-11 NOTE — Discharge Summary (Signed)
  Date of admission: 08/10/2011  Date of discharge: 08/11/2011  Admission diagnosis: Prostate Cancer  Discharge diagnosis: Prostate Cancer  History and Physical: For full details, please see admission history and physical. Briefly, Lawrence Hall is a 66 y.o. gentleman with localized prostate cancer.  After discussing management/treatment options, he elected to proceed with surgical treatment.  Hospital Course: Lawrence Hall was taken to the operating room on 08/10/2011 and underwent a robotic assisted laparoscopic radical prostatectomy. He tolerated this procedure well and without complications. Postoperatively, he was able to be transferred to a regular hospital room following recovery from anesthesia.  He was able to begin ambulating the night of surgery. He remained hemodynamically stable overnight.  He had excellent urine output with appropriately minimal output from his pelvic drain and his pelvic drain was removed on POD #1.  He was transitioned to oral pain medication, tolerated a clear liquid diet, and had met all discharge criteria and was able to be discharged home later on POD#1.  Laboratory values:  Basename 08/11/11 0440 08/10/11 1452  HGB 12.4* 12.9*  HCT 36.9* 37.2*    Disposition: Home  Discharge instruction: He was instructed to be ambulatory but to refrain from heavy lifting, strenuous activity, or driving. He was instructed on urethral catheter care.  Discharge medications:   Medication List  As of 08/11/2011  1:42 PM   START taking these medications         ciprofloxacin 500 MG tablet   Commonly known as: CIPRO   Take 1 tablet (500 mg total) by mouth 2 (two) times daily. Start day prior to office visit for foley removal      HYDROcodone-acetaminophen 5-325 MG per tablet   Commonly known as: NORCO   Take 1-2 tablets by mouth every 6 (six) hours as needed for pain.         CONTINUE taking these medications         albuterol 108 (90 BASE) MCG/ACT inhaler   Commonly known  as: PROVENTIL HFA;VENTOLIN HFA      beclomethasone 80 MCG/ACT inhaler   Commonly known as: QVAR      loratadine 10 MG tablet   Commonly known as: CLARITIN         STOP taking these medications         aspirin 81 MG tablet      cholecalciferol 1000 UNITS tablet      doxylamine (Sleep) 25 MG tablet      mulitivitamin with minerals Tabs      St Johns Wort 300 MG Caps          Where to get your medications    These are the prescriptions that you need to pick up.   You may get these medications from any pharmacy.         ciprofloxacin 500 MG tablet   HYDROcodone-acetaminophen 5-325 MG per tablet            Followup: He will followup in 1 week for catheter removal and to discuss his surgical pathology results.

## 2011-08-11 NOTE — Anesthesia Postprocedure Evaluation (Signed)
  Anesthesia Post-op Note  Patient: Lawrence Hall  Procedure(s) Performed: Procedure(s) (LRB): ROBOTIC ASSISTED LAPAROSCOPIC RADICAL PROSTATECTOMY LEVEL 2 (N/A) LYMPHADENECTOMY (Bilateral)  Patient Location: PACU  Anesthesia Type: General  Level of Consciousness: awake and alert   Airway and Oxygen Therapy: Patient Spontanous Breathing  Post-op Pain: mild  Post-op Assessment: Post-op Vital signs reviewed, Patient's Cardiovascular Status Stable, Respiratory Function Stable, Patent Airway and No signs of Nausea or vomiting  Post-op Vital Signs: stable  Complications: No apparent anesthesia complications

## 2011-08-11 NOTE — Progress Notes (Signed)
   CARE MANAGEMENT NOTE 08/11/2011  Patient:  Lawrence Hall [facility]   Account Number:  [id number]  Date Initiated:  08/11/2011  Documentation initiated by:  Jiles Crocker  Subjective/Objective Assessment:   ADMITTED FOR SURGERY ROBOTIC RADICAL PROSTATECTOMY     Action/Plan:   INDEPENDENT PRIOR TO ADMISSION, LIVES WITH SPOUSE   Anticipated DC Date:  08/12/2011   Anticipated DC Plan:  HOME/SELF CARE           Status of service:  In process, will continue to follow Medicare Important Message given?  NA - LOS <3 / Initial given by admissions (If response is "NO", the following Medicare IM given date fields will be blank)    Per UR Regulation:  Reviewed for med. necessity/level of care/duration of stay   Comments:  08/11/2011- B Janiah Devinney RN, BSN, MHA

## 2011-08-11 NOTE — Progress Notes (Signed)
Patient ID: Lawrence Hall, male   DOB: [birth year], 66 y.o.   MRN: [id number]  1 Day Post-Op Subjective: The patient is doing well.  No nausea or vomiting. Pain is adequately controlled.  Objective: Vital signs in last 24 hours: Temp:  [97 F (36.1 C)-98.7 F (37.1 C)] 98.3 F (36.8 C) (04/23 0521) Pulse Rate:  [69-88] 86  (04/23 0521) Resp:  [14-20] 20  (04/23 0521) BP: (138-199)/(74-121) 157/86 mmHg (04/23 0521) SpO2:  [93 %-100 %] 93 % (04/23 0521) Weight:  [103.647 kg (228 lb 8 oz)] 103.647 kg (228 lb 8 oz) (04/22 1545)  Intake/Output from previous day: 04/22 0701 - 04/23 0700 In: 3500 [I.V.:2450; IV Piggyback:1050] Out: 3854 [Urine:3525; Emesis/NG output:75; Drains:104; Blood:150] Intake/Output this shift:    Physical Exam:  General: Alert and oriented. CV: RRR Lungs: Clear bilaterally. GI: Soft, Nondistended. Incisions: Dressings intact. Urine: Clear Extremities: Nontender, no erythema, no edema.  Lab Results:  Basename 08/11/11 0440 08/10/11 1452  HGB 12.4* 12.9*  HCT 36.9* 37.2*      Assessment/Plan: POD# 1 s/p robotic prostatectomy.  1) SL IVF 2) Ambulate, Incentive spirometry 3) Transition to oral pain medication 4) Dulcolax suppository 5) D/C pelvic drain 6) Plan for likely discharge later today   Lawrence Hall. MD   LOS: 1 day   Lawrence Hall,LES 08/11/2011, 7:11 AM

## 2011-08-14 ENCOUNTER — Other Ambulatory Visit: Payer: BC Managed Care – PPO | Admitting: Internal Medicine

## 2011-08-17 ENCOUNTER — Encounter (HOSPITAL_COMMUNITY): Payer: Self-pay | Admitting: Urology

## 2012-05-24 ENCOUNTER — Other Ambulatory Visit: Payer: Self-pay | Admitting: Family Medicine

## 2012-05-24 ENCOUNTER — Ambulatory Visit
Admission: RE | Admit: 2012-05-24 | Discharge: 2012-05-24 | Disposition: A | Discharge status: Home / Self Care | Payer: Medicare Other | Source: Ambulatory Visit | Attending: Family Medicine | Admitting: Family Medicine

## 2012-05-24 DIAGNOSIS — M545 Low back pain: Secondary | ICD-10-CM

## 2012-06-29 ENCOUNTER — Ambulatory Visit
Admission: RE | Admit: 2012-06-29 | Discharge: 2012-06-29 | Disposition: A | Discharge status: Home / Self Care | Payer: Medicare Other | Source: Ambulatory Visit | Attending: Family Medicine | Admitting: Family Medicine

## 2012-06-29 ENCOUNTER — Other Ambulatory Visit: Payer: Self-pay | Admitting: Family Medicine

## 2012-06-29 DIAGNOSIS — R079 Chest pain, unspecified: Secondary | ICD-10-CM

## 2012-07-14 ENCOUNTER — Encounter: Payer: Medicare Other | Admitting: Gastroenterology

## 2012-07-25 ENCOUNTER — Ambulatory Visit (INDEPENDENT_AMBULATORY_CARE_PROVIDER_SITE_OTHER): Payer: Medicare Other | Admitting: Cardiology

## 2012-07-25 ENCOUNTER — Encounter: Payer: Self-pay | Admitting: Cardiology

## 2012-07-25 VITALS — BP 141/82 | HR 78 | Ht 71.0 in | Wt 218.0 lb

## 2012-07-25 DIAGNOSIS — E7800 Pure hypercholesterolemia, unspecified: Secondary | ICD-10-CM

## 2012-07-25 DIAGNOSIS — R079 Chest pain, unspecified: Secondary | ICD-10-CM

## 2012-07-25 HISTORY — DX: Chest pain, unspecified: R07.9

## 2012-07-25 NOTE — Progress Notes (Signed)
HPI: 67 year-old male for evaluation of chest pain. No prior cardiac history. Patient has dyspnea with more extreme activities but not routine activities. No orthopnea, PND, pedal edema, palpitations, syncope. 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 It was described as a substernal tightness without radiation. The pain was not pleuritic, positional or related to food. No associated symptoms. No radiation. His symptoms lasted approximately 5-10 minutes and resolved spontaneously. He has had no chest pain since. Cardiology is asked to evaluate. Note he does have a fraternal twin brother who had sudden death from coronary disease.  Current Outpatient Prescriptions  Medication Sig Dispense Refill  . albuterol (PROVENTIL HFA;VENTOLIN HFA) 108 (90 BASE) MCG/ACT inhaler Inhale 2 puffs into the lungs every 6 (six) hours as needed. Wheezing       . aspirin 81 MG tablet Take 81 mg by mouth daily.      . beclomethasone (QVAR) 80 MCG/ACT inhaler Inhale 1 puff into the lungs 2 (two) times daily as needed. Wheezing       . pravastatin (PRAVACHOL) 40 MG tablet Take 40 mg by mouth daily.       No current facility-administered medications for this visit.    No Known Allergies  Past Medical History  Diagnosis Date  . Prostate cancer 03/27/2011    prostate bx=adenocarcinoma,  . Asthma   . Hypercholesterolemia   . Skin cancer   . ED (erectile dysfunction)   . Cataract     early signs b/l eyes  . Arthritis     mild hands  . Skin cancer 2008    basal cell face /removed  . Dysrhythmia     PAC'S  . H/O hiatal hernia   . Hypertension     MARGINALLY HIGH - NO MEDS -MONITORS  . Sleep apnea     USES NOSTRIL APARATUS - NO C-PAP- "BORDERLINE SLEEP APMNEA"    Past Surgical History  Procedure Laterality Date  . Basal cell cancer  2008    inside nose  . Thyroid surgery  age [age]  . Robot assisted laparoscopic radical prostatectomy   08/10/2011    Procedure: ROBOTIC ASSISTED LAPAROSCOPIC RADICAL PROSTATECTOMY LEVEL 2;  Surgeon: Crecencio Mc, MD;  Location: [facility];  Service: Urology;  Laterality: N/A;    History   Social History  . Marital Status: Married    Spouse Name: N/A    Number of Children: N/A  . Years of Education: N/A   Occupational History  .  [employer]    customer service [employer]   Social History Main Topics  . Smoking status: Never Smoker   . Smokeless tobacco: Not on file  . Alcohol Use: Yes     Comment: Occasional  . Drug Use: No  . Sexually Active: Not on file   Other Topics Concern  . Not on file   Social History Narrative  . No narrative on file    Family History  Problem Relation Age of Onset  . Prostate cancer Father 32    in his 70's/other comorbidities/79 deceased  . Breast cancer Mother 86    mets to bone,deceased age 23  . Cervical cancer Sister     2 sisters  . Cervical cancer Sister   . CAD Brother     Died of sudden cardiac death/MI    ROS: no fevers or chills, productive cough, hemoptysis, dysphasia, odynophagia, melena, hematochezia, dysuria, hematuria, rash, seizure activity, orthopnea, PND,  pedal edema, claudication. Remaining systems are negative.  Physical Exam:   Blood pressure 141/82, pulse 78, height 5\' 11"  (1.803 m), weight 218 lb (98.884 kg).  General:  Well developed/well nourished in NAD Skin warm/dry Patient not depressed No peripheral clubbing Back-normal HEENT-normal/normal eyelids Neck supple/normal carotid upstroke bilaterally; no bruits; no JVD; no thyromegaly chest - CTA/ normal expansion CV - RRR/normal S1 and S2; no murmurs, rubs or gallops;  PMI nondisplaced Abdomen -NT/ND, no HSM, no mass, + bowel sounds, no bruit 2+ femoral pulses, no bruits Ext-no edema, chords, 2+ DP Neuro-grossly nonfocal  ECG 06/27/2012-sinus rhythm with PACs and no ST changes.

## 2012-07-25 NOTE — Assessment & Plan Note (Signed)
Continue statin. 

## 2012-07-25 NOTE — Patient Instructions (Addendum)
Your physician recommends that you schedule a follow-up appointment in: AS NEEDED PENDING TEST RESULTS  Your physician has requested that you have a stress echocardiogram. For further information please visit [email or web address]. Please follow instruction sheet as given.    

## 2012-07-25 NOTE — Assessment & Plan Note (Signed)
Patient has both typical and atypical features. His risk factors include hyperlipidemia and fraternal twin who died of sudden cardiac death related to coronary disease. Plan to proceed with a stress echocardiogram to exclude ischemia. This was also help quantitate his LV function as he has mild dyspnea on exertion.

## 2012-08-08 ENCOUNTER — Ambulatory Visit (HOSPITAL_BASED_OUTPATIENT_CLINIC_OR_DEPARTMENT_OTHER): Payer: Medicare Other

## 2012-08-08 ENCOUNTER — Ambulatory Visit (HOSPITAL_COMMUNITY): Payer: Medicare Other | Attending: Internal Medicine | Admitting: Radiology

## 2012-08-08 ENCOUNTER — Encounter: Payer: Self-pay | Admitting: Cardiology

## 2012-08-08 DIAGNOSIS — R0609 Other forms of dyspnea: Secondary | ICD-10-CM | POA: Insufficient documentation

## 2012-08-08 DIAGNOSIS — R0989 Other specified symptoms and signs involving the circulatory and respiratory systems: Secondary | ICD-10-CM | POA: Insufficient documentation

## 2012-08-08 DIAGNOSIS — Z8249 Family history of ischemic heart disease and other diseases of the circulatory system: Secondary | ICD-10-CM | POA: Insufficient documentation

## 2012-08-08 DIAGNOSIS — R079 Chest pain, unspecified: Secondary | ICD-10-CM

## 2012-08-08 DIAGNOSIS — E785 Hyperlipidemia, unspecified: Secondary | ICD-10-CM | POA: Insufficient documentation

## 2012-08-08 DIAGNOSIS — J45909 Unspecified asthma, uncomplicated: Secondary | ICD-10-CM | POA: Insufficient documentation

## 2012-08-08 DIAGNOSIS — I10 Essential (primary) hypertension: Secondary | ICD-10-CM | POA: Insufficient documentation

## 2012-08-08 DIAGNOSIS — E7800 Pure hypercholesterolemia, unspecified: Secondary | ICD-10-CM

## 2012-08-08 DIAGNOSIS — R5381 Other malaise: Secondary | ICD-10-CM | POA: Insufficient documentation

## 2012-08-08 DIAGNOSIS — R072 Precordial pain: Secondary | ICD-10-CM | POA: Insufficient documentation

## 2012-08-08 DIAGNOSIS — R42 Dizziness and giddiness: Secondary | ICD-10-CM | POA: Insufficient documentation

## 2012-08-08 NOTE — Progress Notes (Signed)
Stress Echocardiogram performed.  

## 2012-08-15 ENCOUNTER — Encounter: Payer: Medicare Other | Admitting: Gastroenterology

## 2012-09-13 ENCOUNTER — Encounter: Payer: Medicare Other | Admitting: Gastroenterology

## 2012-11-02 ENCOUNTER — Encounter: Payer: Self-pay | Admitting: Gastroenterology

## 2013-01-20 ENCOUNTER — Telehealth: Payer: Self-pay | Admitting: *Deleted

## 2013-01-20 NOTE — Telephone Encounter (Signed)
Left message for pt to call back before 5 pm on 01/20/13 or he would need to call office and reschedule pre-visit and colonoscopy, pt was no show for pre-visit on 01/20/13.  Cancelled colonoscopy for 02/03/13.-adm

## 2013-01-27 ENCOUNTER — Encounter: Payer: Self-pay | Admitting: Gastroenterology

## 2013-02-03 ENCOUNTER — Encounter: Payer: Medicare Other | Admitting: Gastroenterology

## 2013-03-22 ENCOUNTER — Other Ambulatory Visit (HOSPITAL_COMMUNITY): Payer: Self-pay | Admitting: Family Medicine

## 2013-03-22 ENCOUNTER — Ambulatory Visit (HOSPITAL_COMMUNITY): Payer: Medicare Other | Attending: Family Medicine | Admitting: Radiology

## 2013-03-22 DIAGNOSIS — E7800 Pure hypercholesterolemia, unspecified: Secondary | ICD-10-CM | POA: Insufficient documentation

## 2013-03-22 DIAGNOSIS — I499 Cardiac arrhythmia, unspecified: Secondary | ICD-10-CM | POA: Insufficient documentation

## 2013-03-22 DIAGNOSIS — R011 Cardiac murmur, unspecified: Secondary | ICD-10-CM | POA: Insufficient documentation

## 2013-03-22 DIAGNOSIS — G4730 Sleep apnea, unspecified: Secondary | ICD-10-CM | POA: Insufficient documentation

## 2013-03-22 DIAGNOSIS — I10 Essential (primary) hypertension: Secondary | ICD-10-CM | POA: Insufficient documentation

## 2013-03-22 DIAGNOSIS — R079 Chest pain, unspecified: Secondary | ICD-10-CM | POA: Insufficient documentation

## 2013-03-22 NOTE — Progress Notes (Signed)
Echocardiogram performed.  

## 2013-03-30 ENCOUNTER — Ambulatory Visit (HOSPITAL_BASED_OUTPATIENT_CLINIC_OR_DEPARTMENT_OTHER): Payer: Medicare Other | Attending: Family Medicine

## 2013-03-30 VITALS — Ht 71.0 in | Wt 233.0 lb

## 2013-03-30 DIAGNOSIS — R0989 Other specified symptoms and signs involving the circulatory and respiratory systems: Secondary | ICD-10-CM | POA: Insufficient documentation

## 2013-03-30 DIAGNOSIS — R0609 Other forms of dyspnea: Secondary | ICD-10-CM | POA: Insufficient documentation

## 2013-03-30 DIAGNOSIS — G4733 Obstructive sleep apnea (adult) (pediatric): Secondary | ICD-10-CM | POA: Insufficient documentation

## 2013-04-08 DIAGNOSIS — G4733 Obstructive sleep apnea (adult) (pediatric): Secondary | ICD-10-CM

## 2013-04-08 NOTE — Sleep Study (Signed)
   NAME: Lawrence Hall DATE OF BIRTH:  [birth year] MEDICAL RECORD NUMBER [id number]  LOCATION: [facility]  PHYSICIAN: YOUNG,CLINTON D  DATE OF STUDY: 03/30/2013  SLEEP STUDY TYPE: Nocturnal Polysomnogram               REFERRING PHYSICIAN: Carolyne Fiscal, MD  INDICATION FOR STUDY: Insomnia with sleep apnea EPWORTH SLEEPINESS SCORE:   2/24 HEIGHT: 5\' 11"  (180.3 cm)  WEIGHT: 233 lb (105.688 kg)    Body mass index is 32.51 kg/(m^2).  NECK SIZE: 18 in.  MEDICATIONS: Charted for review  SLEEP ARCHITECTURE: Total sleep time 359 minutes with sleep efficiency 95.9%. Stage I was 2.4%, stage II 85%, stage III absent, REM 12.7% of total sleep time. Sleep latency 8 minutes, REM latency 63 minutes, awake after sleep onset 6.5 minutes, arousal index 5.3. Bedtime medication: Melatonin, aspirin, amitriptyline  RESPIRATORY DATA: Apnea hypopnea index (AHI) 15.0 per hour. A total of 90 events were scored including 32 obstructive apneas, 2 central apneas, 56 hypopneas. Events were more common while supine. REM AHI 40.9 per hour. There were not enough early events to meet protocol requirements for split CPAP titration.  OXYGEN DATA: Very loud snoring with oxygen desaturation to a nadir of 85% and the mean oxygen saturation to the study of 92.5% on room air.  CARDIAC DATA: Normal sinus rhythm  MOVEMENT/PARASOMNIA: No significant movement disturbance. Bathroom x1  IMPRESSION/ RECOMMENDATION:    1) Mild-moderate obstructive sleep apnea/hypopnea  syndrome, AHI 15 per hour. Events were more common while supine. REM AHI 15 per hour. Very loud snoring with oxygen desaturation to a nadir of 85% and mean oxygen saturation to the study of 92.5% on room air.  2) There were not enough early events to meet protocol requirements for split CPAP titration. This patient can return for dedicated CPAP titration study if appropriate.  Waymon Budge Diplomate, American Board of Sleep  Medicine  Signed Jetty Duhamel M.D. ELECTRONICALLY SIGNED ON:  04/08/2013, 1:34 PM [facility] PH: [phone]   FX: [id number] ACCREDITED BY THE AMERICAN ACADEMY OF SLEEP MEDICINE

## 2013-05-02 ENCOUNTER — Encounter (HOSPITAL_BASED_OUTPATIENT_CLINIC_OR_DEPARTMENT_OTHER): Payer: Medicare Other

## 2013-08-18 DEATH — deceased

## 2015-04-24 DIAGNOSIS — J45998 Other asthma: Secondary | ICD-10-CM | POA: Diagnosis not present

## 2015-04-24 DIAGNOSIS — R0600 Dyspnea, unspecified: Secondary | ICD-10-CM | POA: Diagnosis not present

## 2015-05-17 DIAGNOSIS — D045 Carcinoma in situ of skin of trunk: Secondary | ICD-10-CM | POA: Diagnosis not present

## 2015-05-31 ENCOUNTER — Encounter (HOSPITAL_COMMUNITY): Payer: Self-pay

## 2015-05-31 ENCOUNTER — Inpatient Hospital Stay (HOSPITAL_COMMUNITY)
Admission: EM | Admit: 2015-05-31 | Discharge: 2015-06-04 | DRG: 247 | Disposition: A | Discharge status: Home / Self Care | Payer: PPO | Attending: Internal Medicine | Admitting: Internal Medicine

## 2015-05-31 ENCOUNTER — Emergency Department (HOSPITAL_COMMUNITY): Payer: PPO

## 2015-05-31 DIAGNOSIS — Z8042 Family history of malignant neoplasm of prostate: Secondary | ICD-10-CM | POA: Diagnosis not present

## 2015-05-31 DIAGNOSIS — Z8249 Family history of ischemic heart disease and other diseases of the circulatory system: Secondary | ICD-10-CM | POA: Diagnosis not present

## 2015-05-31 DIAGNOSIS — R079 Chest pain, unspecified: Secondary | ICD-10-CM | POA: Diagnosis not present

## 2015-05-31 DIAGNOSIS — Z85828 Personal history of other malignant neoplasm of skin: Secondary | ICD-10-CM

## 2015-05-31 DIAGNOSIS — R0602 Shortness of breath: Secondary | ICD-10-CM | POA: Diagnosis not present

## 2015-05-31 DIAGNOSIS — Z8546 Personal history of malignant neoplasm of prostate: Secondary | ICD-10-CM

## 2015-05-31 DIAGNOSIS — Z9079 Acquired absence of other genital organ(s): Secondary | ICD-10-CM | POA: Diagnosis not present

## 2015-05-31 DIAGNOSIS — I209 Angina pectoris, unspecified: Secondary | ICD-10-CM | POA: Diagnosis not present

## 2015-05-31 DIAGNOSIS — R7989 Other specified abnormal findings of blood chemistry: Secondary | ICD-10-CM

## 2015-05-31 DIAGNOSIS — R0600 Dyspnea, unspecified: Secondary | ICD-10-CM | POA: Diagnosis not present

## 2015-05-31 DIAGNOSIS — M1990 Unspecified osteoarthritis, unspecified site: Secondary | ICD-10-CM | POA: Diagnosis present

## 2015-05-31 DIAGNOSIS — I214 Non-ST elevation (NSTEMI) myocardial infarction: Secondary | ICD-10-CM | POA: Diagnosis not present

## 2015-05-31 DIAGNOSIS — J45909 Unspecified asthma, uncomplicated: Secondary | ICD-10-CM | POA: Diagnosis present

## 2015-05-31 DIAGNOSIS — K219 Gastro-esophageal reflux disease without esophagitis: Secondary | ICD-10-CM | POA: Diagnosis not present

## 2015-05-31 DIAGNOSIS — Z888 Allergy status to other drugs, medicaments and biological substances status: Secondary | ICD-10-CM

## 2015-05-31 DIAGNOSIS — Q752 Hypertelorism: Secondary | ICD-10-CM | POA: Insufficient documentation

## 2015-05-31 DIAGNOSIS — Z955 Presence of coronary angioplasty implant and graft: Secondary | ICD-10-CM

## 2015-05-31 DIAGNOSIS — E669 Obesity, unspecified: Secondary | ICD-10-CM | POA: Diagnosis not present

## 2015-05-31 DIAGNOSIS — I25110 Atherosclerotic heart disease of native coronary artery with unstable angina pectoris: Secondary | ICD-10-CM | POA: Diagnosis present

## 2015-05-31 DIAGNOSIS — I350 Nonrheumatic aortic (valve) stenosis: Secondary | ICD-10-CM | POA: Diagnosis not present

## 2015-05-31 DIAGNOSIS — I10 Essential (primary) hypertension: Secondary | ICD-10-CM | POA: Diagnosis present

## 2015-05-31 DIAGNOSIS — E7800 Pure hypercholesterolemia, unspecified: Secondary | ICD-10-CM | POA: Diagnosis not present

## 2015-05-31 DIAGNOSIS — Z23 Encounter for immunization: Secondary | ICD-10-CM

## 2015-05-31 DIAGNOSIS — Z6831 Body mass index (BMI) 31.0-31.9, adult: Secondary | ICD-10-CM | POA: Diagnosis not present

## 2015-05-31 DIAGNOSIS — G4730 Sleep apnea, unspecified: Secondary | ICD-10-CM | POA: Diagnosis not present

## 2015-05-31 DIAGNOSIS — I200 Unstable angina: Secondary | ICD-10-CM | POA: Diagnosis not present

## 2015-05-31 DIAGNOSIS — Z7982 Long term (current) use of aspirin: Secondary | ICD-10-CM | POA: Diagnosis not present

## 2015-05-31 DIAGNOSIS — R778 Other specified abnormalities of plasma proteins: Secondary | ICD-10-CM | POA: Diagnosis present

## 2015-05-31 DIAGNOSIS — E785 Hyperlipidemia, unspecified: Secondary | ICD-10-CM | POA: Diagnosis not present

## 2015-05-31 DIAGNOSIS — Z8241 Family history of sudden cardiac death: Secondary | ICD-10-CM | POA: Diagnosis not present

## 2015-05-31 LAB — COMPREHENSIVE METABOLIC PANEL
ALBUMIN: 3.7 g/dL (ref 3.5–5.0)
ALT: 21 U/L (ref 17–63)
ANION GAP: 12 (ref 5–15)
AST: 21 U/L (ref 15–41)
Alkaline Phosphatase: 45 U/L (ref 38–126)
BUN: 24 mg/dL — AB (ref 6–20)
CHLORIDE: 104 mmol/L (ref 101–111)
CO2: 24 mmol/L (ref 22–32)
Calcium: 9.3 mg/dL (ref 8.9–10.3)
Creatinine, Ser: 0.95 mg/dL (ref 0.61–1.24)
GFR calc Af Amer: >60 mL/min (ref >60)
Glucose, Bld: 140 mg/dL — ABNORMAL HIGH (ref 65–99)
POTASSIUM: 3.8 mmol/L (ref 3.5–5.1)
SODIUM: 140 mmol/L (ref 135–145)
TOTAL PROTEIN: 7.4 g/dL (ref 6.5–8.1)
Total Bilirubin: 0.7 mg/dL (ref 0.3–1.2)

## 2015-05-31 LAB — CBC
HEMATOCRIT: 41.3 % (ref 39.0–52.0)
Hemoglobin: 14.2 g/dL (ref 13.0–17.0)
MCH: 28.7 pg (ref 26.0–34.0)
MCHC: 34.4 g/dL (ref 30.0–36.0)
MCV: 83.4 fL (ref 78.0–100.0)
Platelets: 209 10*3/uL (ref 150–400)
RBC: 4.95 MIL/uL (ref 4.22–5.81)
RDW: 13.2 % (ref 11.5–15.5)
WBC: 6.1 10*3/uL (ref 4.0–10.5)

## 2015-05-31 LAB — TROPONIN I
Troponin I: 0.15 ng/mL — ABNORMAL HIGH (ref <0.031)
Troponin I: 0.18 ng/mL — ABNORMAL HIGH (ref <0.031)

## 2015-05-31 LAB — PROTIME-INR
INR: 1.13 (ref 0.00–1.49)
PROTHROMBIN TIME: 14.7 s (ref 11.6–15.2)

## 2015-05-31 LAB — I-STAT TROPONIN, ED: TROPONIN I, POC: 0.1 ng/mL — AB (ref 0.00–0.08)

## 2015-05-31 LAB — APTT: APTT: 32 s (ref 24–37)

## 2015-05-31 MED ORDER — ONDANSETRON HCL 4 MG/2ML IJ SOLN: 4.0000 mg | Freq: Four times a day (QID) | INTRAMUSCULAR | Status: DC | PRN

## 2015-05-31 MED ORDER — ASPIRIN 81 MG PO CHEW
81.0000 mg | CHEWABLE_TABLET | Freq: Every day | ORAL | Status: DC
  Administered 2015-05-31 – 2015-06-02 (×3): 81 mg via ORAL
  Filled 2015-05-31 (×4): qty 1

## 2015-05-31 MED ORDER — ONDANSETRON HCL 4 MG PO TABS: 4.0000 mg | ORAL_TABLET | Freq: Four times a day (QID) | ORAL | Status: DC | PRN

## 2015-05-31 MED ORDER — METOPROLOL SUCCINATE ER 25 MG PO TB24
25.0000 mg | ORAL_TABLET | Freq: Every day | ORAL | Status: DC
  Administered 2015-06-01 – 2015-06-04 (×5): 25 mg via ORAL
  Filled 2015-05-31 (×6): qty 1

## 2015-05-31 MED ORDER — ZOLPIDEM TARTRATE 5 MG PO TABS
5.0000 mg | ORAL_TABLET | Freq: Once | ORAL | Status: AC
  Administered 2015-05-31: 5 mg via ORAL
  Filled 2015-05-31: qty 1

## 2015-05-31 MED ORDER — ACETAMINOPHEN 325 MG PO TABS: 650.0000 mg | ORAL_TABLET | Freq: Four times a day (QID) | ORAL | Status: DC | PRN

## 2015-05-31 MED ORDER — SODIUM CHLORIDE 0.9% FLUSH
3.0000 mL | Freq: Two times a day (BID) | INTRAVENOUS | Status: DC
  Administered 2015-05-31 – 2015-06-02 (×5): 3 mL via INTRAVENOUS

## 2015-05-31 MED ORDER — DOXYLAMINE SUCCINATE (SLEEP) 25 MG PO TABS: 25.0000 mg | ORAL_TABLET | Freq: Once | ORAL | Status: DC

## 2015-05-31 NOTE — ED Provider Notes (Signed)
CSN: [id number]     Arrival date & time    History   First MD Initiated Contact with Patient 05/31/15 1519     Chief Complaint  Patient presents with  . Chest Pain     (Consider location/radiation/quality/duration/timing/severity/associated sxs/prior Treatment) The history is provided by the patient.   70 year old male referred in from his primary care writer's office for 2 week history of intermittent chest pain midsternal radiating to the right arm. Never lasted more than 10 minutes. But had worsening symptoms yesterday in that it was associated with diaphoresis and shortness of breath. Same thing happened today. Seems to be occurring about an hour after eating. Patient with no known cardiac history. No nausea no vomiting. No visual changes no syncope. Patient's pain is described as a heavy pressure. No known past cardiac history. Status post cardiac stress test 2 years ago which was negative. Patient has been pain-free for over an hour. Patient given aspirin already.  Past Medical History  Diagnosis Date  . Prostate cancer ([place]) 03/27/2011    prostate bx=adenocarcinoma,  . Asthma   . Hypercholesterolemia   . Skin cancer   . ED (erectile dysfunction)   . Cataract     early signs b/l eyes  . Arthritis     mild hands  . Skin cancer 2008    basal cell face /removed  . Dysrhythmia     PAC'S  . H/O hiatal hernia   . Hypertension     MARGINALLY HIGH - NO MEDS -MONITORS  . Sleep apnea     USES NOSTRIL APARATUS - NO C-PAP- "BORDERLINE SLEEP APMNEA"   Past Surgical History  Procedure Laterality Date  . Basal cell cancer  2008    inside nose  . Thyroid surgery  age 48  . Robot assisted laparoscopic radical prostatectomy  08/10/2011    Procedure: ROBOTIC ASSISTED LAPAROSCOPIC RADICAL PROSTATECTOMY LEVEL 2;  Surgeon: Dutch Gray, MD;  Location: [facility];  Service: Urology;  Laterality: N/A;   Family History  Problem Relation Age of Onset  . Prostate cancer Father 49    in his  70's/other comorbidities/79 deceased  . Breast cancer Mother 83    mets to bone,deceased age 42  . Cervical cancer Sister     2 sisters  . Cervical cancer Sister   . CAD Brother     Died of sudden cardiac death/MI   Social History  Substance Use Topics  . Smoking status: Never Smoker   . Smokeless tobacco: None  . Alcohol Use: Yes     Comment: Occasional    Review of Systems  Constitutional: Positive for diaphoresis. Negative for fever.  HENT: Negative for congestion.   Eyes: Negative for visual disturbance.  Respiratory: Positive for shortness of breath.   Cardiovascular: Positive for chest pain.  Gastrointestinal: Negative for nausea, vomiting and abdominal pain.  Genitourinary: Negative for dysuria.  Musculoskeletal: Negative for back pain.  Skin: Negative for rash.  Neurological: Negative for headaches.  Hematological: Does not bruise/bleed easily.  Psychiatric/Behavioral: Negative for confusion.      Allergies  Heparin and Other  Home Medications   Prior to Admission medications   Medication Sig Start Date End Date Taking? Authorizing Provider  aspirin 81 MG tablet Take 81 mg by mouth at bedtime.    Yes Historical Provider, MD   BP 135/79 mmHg  Pulse 80  Temp(Src) 98 F (36.7 C) (Oral)  Resp 18  Ht 6' (1.829 m)  Wt 106.595 kg  BMI  31.86 kg/m2  SpO2 94% Physical Exam  Constitutional: He is oriented to person, place, and time. He appears well-developed and well-nourished. No distress.  HENT:  Head: Normocephalic and atraumatic.  Mouth/Throat: Oropharynx is clear and moist.  Eyes: Conjunctivae and EOM are normal. Pupils are equal, round, and reactive to light.  Neck: Normal range of motion. Neck supple.  Cardiovascular: Normal rate, regular rhythm and normal heart sounds.   No murmur heard. Pulmonary/Chest: Effort normal and breath sounds normal. No respiratory distress.  Abdominal: Soft. Bowel sounds are normal. There is no tenderness.   Musculoskeletal: Normal range of motion.  Neurological: He is alert and oriented to person, place, and time. No cranial nerve deficit. He exhibits normal muscle tone. Coordination normal.  Skin: Skin is warm. No rash noted.  Nursing note and vitals reviewed.   ED Course  Procedures (including critical care time) Labs Review Labs Reviewed  COMPREHENSIVE METABOLIC PANEL - Abnormal; Notable for the following:    Glucose, Bld 140 (*)    BUN 24 (*)    All other components within normal limits  TROPONIN I - Abnormal; Notable for the following:    Troponin I 0.15 (*)    All other components within normal limits  I-STAT TROPOININ, ED - Abnormal; Notable for the following:    Troponin i, poc 0.10 (*)    All other components within normal limits  CBC  APTT  PROTIME-INR    Imaging Review Dg Chest 2 View  05/31/2015  CLINICAL DATA:  70 year old male with acute chest pain and shortness of breath. EXAM: CHEST  2 VIEW COMPARISON:  06/29/2012 and 02/12/2011 chest radiograph FINDINGS: The cardiomediastinal silhouette is unremarkable. Mild peribronchial thickening is unchanged. There is no evidence of focal airspace disease, pulmonary edema, suspicious pulmonary nodule/mass, pleural effusion, or pneumothorax. No acute bony abnormalities are identified. IMPRESSION: No active cardiopulmonary disease. Electronically Signed   By: Margarette Canada M.D.   On: 05/31/2015 16:15   I have personally reviewed and evaluated these images and lab results as part of my medical decision-making.   EKG Interpretation   Date/Time:  Friday May 31 2015 15:26:34 EST Ventricular Rate:  80 PR Interval:  215 QRS Duration: 90 QT Interval:  389 QTC Calculation: 449 R Axis:   -29 Text Interpretation:  Sinus rhythm Atrial premature complexes Borderline  prolonged PR interval Borderline left axis deviation Abnormal R-wave  progression, early transition Borderline repolarization abnormality  Confirmed by Ava Deguire  MD,  Renaye Janicki ([id number]) on 05/31/2015 3:48:27 PM      MDM   Final diagnoses:  Non-STEMI (non-ST elevated myocardial infarction) ([place])    The patient with intermittent midsternal chest pain over the past 2 weeks. Worsened yesterday episodes have only been 10 minutes at most. Yesterday had an episode with shortness of breath and diaphoresis. Had another episode here approximately one hour prior to arrival that lasted 10 minutes when in route to his primary care provider. Referred here for further evaluation.  Patient with no known coronary artery disease. Patient's initial point of care troponin slightly elevated. Read the troponin slightly elevated. Patient pain free the whole time here. EKG without acute changes. Labs without significant abnormalities. Patient was given aspirin prior to arrival.  Duration of chest pain not as long as expected for an MI. Because be an unstable angina picture. We'll require admission observation and serial troponins. Contacted the hospitalist service for admission.   Fredia Sorrow, MD 05/31/15 Lurena Nida

## 2015-05-31 NOTE — ED Notes (Signed)
Attempted report. Left call back number.

## 2015-05-31 NOTE — ED Notes (Signed)
Patient transported to X-ray 

## 2015-05-31 NOTE — ED Notes (Signed)
Pt presents with mid-sternal chest pain x 2 weeks, reports pain has worsened.  Pt reports yesterday, he was out putting up a birdhouse and doing yardwork with onset of mid-sternal chest pressure that caused increased shortness of breath and diaphoresis.  Pt went to PCP and referred here.

## 2015-05-31 NOTE — ED Notes (Signed)
Second attempt for report.  

## 2015-05-31 NOTE — H&P (Signed)
[facility] History and Physical  Lawrence Hall [id number] DOB: [birth year] DOA: 05/31/2015  Referring physician: Fredia Sorrow, MD PCP: Marylene Land, MD   Chief Complaint: CHEST PAIN.  HPI: Lawrence Hall is a 70 y.o. male with a past medical history of prostate adenocarcinoma, asthma, hyperlipidemia, skin cancer, ED cataract, posterior arthritis, cardiac dysrhythmia, GERD, diabetic control hypertension, mild sleep apnea who comes to the emergency department due to having episodes of chest pains for the past 2 weeks.  Per patient, for the past 2 weeks, he has been having midsternal chest pressure on exertion, nonradiating associated with mild dyspnea and occasionally with diaphoresis. He denies dizziness, nausea, pitting edema lower extremities PND or orthopnea. Per patient, the last episode of chest pain that he had was yesterday afternoon while he was doing so in jar work at home and was putting up a bird house when he felt sudden onset of midsternal chest pain, pressure-like, nonradiating associated with the dyspnea and diaphoresis. Patient states that he went today to his PCP who recommended for him to come to the emergency department. He is not a smoker. He has a history of hyperlipidemia, hypertension, obesity and a family history of sudden cardiac death at age 31 of his fraternal twin.  When seen in the emergency department, the patient was in no acute distress. He denied chest pain. EKG did not have any acute changes, however his troponin was mildly elevated at 0.15.   Review of Systems:  Constitutional:  Positive chills, fatigue No weight loss, night sweats, Fevers.  HEENT:  No headaches, Difficulty swallowing,Tooth/dental problems,Sore throat,  No sneezing, itching, ear ache, nasal congestion, post nasal drip,  Cardio-vascular:  As above mentioned. GI:  No heartburn, indigestion, abdominal pain, nausea, vomiting, diarrhea, change in bowel habits, loss of appetite    Resp:  Mild dyspnea. Denies recent productive cough, wheezing or hemoptysis. Skin:  no rash or lesions.  GU:  no dysuria, change in color of urine, no urgency or frequency. No flank pain.  Musculoskeletal:  Occasional arthralgias. No decreased range of motion. No back pain.  Psych:  No change in mood or affect. No depression or anxiety. No memory loss.   Past Medical History  Diagnosis Date  . Prostate cancer ([place]) 03/27/2011    prostate bx=adenocarcinoma,  . Asthma   . Hypercholesterolemia   . Skin cancer   . ED (erectile dysfunction)   . Cataract     early signs b/l eyes  . Arthritis     mild hands  . Skin cancer 2008    basal cell face /removed  . Dysrhythmia     PAC'S  . H/O hiatal hernia   . Hypertension     MARGINALLY HIGH - NO MEDS -MONITORS  . Sleep apnea     USES NOSTRIL APARATUS - NO C-PAP- "BORDERLINE SLEEP APMNEA"   Past Surgical History  Procedure Laterality Date  . Basal cell cancer  2008    inside nose  . Thyroid surgery  age 56  . Robot assisted laparoscopic radical prostatectomy  08/10/2011    Procedure: ROBOTIC ASSISTED LAPAROSCOPIC RADICAL PROSTATECTOMY LEVEL 2;  Surgeon: Dutch Gray, MD;  Location: [facility];  Service: Urology;  Laterality: N/A;   Social History:  reports that he has never smoked. He does not have any smokeless tobacco history on file. He reports that he drinks alcohol. He reports that he does not use illicit drugs.  Allergies  Allergen Reactions  . Heparin Other (See Comments)  Blood thinner with pheresis Passed out  . Other Other (See Comments)    Pet dander, seasonal allergies    Family History  Problem Relation Age of Onset  . Prostate cancer Father 83    in his 70's/other comorbidities/79 deceased  . Breast cancer Mother 16    mets to bone,deceased age 45  . Cervical cancer Sister     2 sisters  . Cervical cancer Sister   . CAD Brother     Died of sudden cardiac death/MI    Prior to Admission medications    Medication Sig Start Date End Date Taking? Authorizing Provider  aspirin 81 MG tablet Take 81 mg by mouth at bedtime.    Yes Historical Provider, MD   Physical Exam: Filed Vitals:   05/31/15 1745 05/31/15 1800 05/31/15 1815 05/31/15 1900  BP: 135/79 134/79 136/87 142/88  Pulse: 80 67 70 75  Temp:      TempSrc:      Resp: 18 26 14 15   Height:      Weight:      SpO2: 94% 96% 96% 96%    Wt Readings from Last 3 Encounters:  05/31/15 106.595 kg (235 lb)  03/30/13 105.688 kg (233 lb)  07/25/12 98.884 kg (218 lb)    General:  Appears calm and comfortable Eyes: PERRL, normal lids, irises & conjunctiva ENT: grossly normal hearing, lips & tongue Neck: no LAD, masses or thyromegaly Cardiovascular: RRR, no m/r/g. No LE edema. Telemetry: SR, no arrhythmias  Respiratory: CTA bilaterally, no w/r/r. Normal respiratory effort. Abdomen: BS+, soft, ntnd Skin: no rash or induration seen on limited exam Musculoskeletal: grossly normal tone BUE/BLE Psychiatric: grossly normal mood and affect, speech fluent and appropriate Neurologic: Awake, alert, oriented x3, grossly non-focal.          Labs on Admission:  Basic Metabolic Panel:  Recent Labs Lab 05/31/15 1529  NA 140  K 3.8  CL 104  CO2 24  GLUCOSE 140*  BUN 24*  CREATININE 0.95  CALCIUM 9.3   Liver Function Tests:  Recent Labs Lab 05/31/15 1529  AST 21  ALT 21  ALKPHOS 45  BILITOT 0.7  PROT 7.4  ALBUMIN 3.7   CBC:  Recent Labs Lab 05/31/15 1529  WBC 6.1  HGB 14.2  HCT 41.3  MCV 83.4  PLT 209   Cardiac Enzymes:  Recent Labs Lab 05/31/15 1556  TROPONINI 0.15*    Radiological Exams on Admission: Dg Chest 2 View  05/31/2015  CLINICAL DATA:  70 year old male with acute chest pain and shortness of breath. EXAM: CHEST  2 VIEW COMPARISON:  06/29/2012 and 02/12/2011 chest radiograph FINDINGS: The cardiomediastinal silhouette is unremarkable. Mild peribronchial thickening is unchanged. There is no evidence of  focal airspace disease, pulmonary edema, suspicious pulmonary nodule/mass, pleural effusion, or pneumothorax. No acute bony abnormalities are identified. IMPRESSION: No active cardiopulmonary disease. Electronically Signed   By: Margarette Canada M.D.   On: 05/31/2015 16:15    EKG: Independently reviewed.  Vent. rate 80 BPM PR interval 215 ms QRS duration 90 ms QT/QTc 389/449 ms P-R-T axes -5 -29 103 Sinus rhythm Atrial premature complexes Borderline prolonged PR interval Borderline left axis deviation Abnormal R-wave progression, early transition Borderline repolarization abnormality  Assessment/Plan Principal Problem:   Chest pain   Elevated troponin Admit to telemetry. Continue supplemental oxygen. Continue aspirin. Start metoprolol 25 mg by mouth daily. Trend troponin levels. Check echocardiogram.  Active Problems:   Essential Hypertension Start metoprolol succinate 25 mg by mouth  every day. Monitor blood pressure periodically.    Hyperlipidemia Not currently taking any medications. Check fasting lipids in the morning.    Arthritis Continue mild analgesics as needed.    Asthma Stable. Bronchodilators as needed.    Code Status: Full code. DVT Prophylaxis: Lovenox SQ. Family Communication: His wife is present in the room. Disposition Plan: Admit to telemetry, trend troponin levels.  Time spent: Over 70 minutes were spent in the process of this admission.  Reubin Milan, M.D. [facility] Pager [phone].

## 2015-06-01 ENCOUNTER — Observation Stay (HOSPITAL_BASED_OUTPATIENT_CLINIC_OR_DEPARTMENT_OTHER): Payer: PPO

## 2015-06-01 DIAGNOSIS — R7989 Other specified abnormal findings of blood chemistry: Secondary | ICD-10-CM | POA: Diagnosis not present

## 2015-06-01 DIAGNOSIS — I350 Nonrheumatic aortic (valve) stenosis: Secondary | ICD-10-CM | POA: Diagnosis not present

## 2015-06-01 DIAGNOSIS — E785 Hyperlipidemia, unspecified: Secondary | ICD-10-CM | POA: Diagnosis not present

## 2015-06-01 DIAGNOSIS — I214 Non-ST elevation (NSTEMI) myocardial infarction: Secondary | ICD-10-CM | POA: Diagnosis not present

## 2015-06-01 DIAGNOSIS — R079 Chest pain, unspecified: Secondary | ICD-10-CM

## 2015-06-01 DIAGNOSIS — I10 Essential (primary) hypertension: Secondary | ICD-10-CM | POA: Diagnosis not present

## 2015-06-01 DIAGNOSIS — I200 Unstable angina: Secondary | ICD-10-CM | POA: Diagnosis not present

## 2015-06-01 LAB — BASIC METABOLIC PANEL
Anion gap: 10 (ref 5–15)
BUN: 24 mg/dL — AB (ref 6–20)
CHLORIDE: 105 mmol/L (ref 101–111)
CO2: 25 mmol/L (ref 22–32)
CREATININE: 1 mg/dL (ref 0.61–1.24)
Calcium: 8.7 mg/dL — ABNORMAL LOW (ref 8.9–10.3)
GFR calc Af Amer: >60 mL/min (ref >60)
GFR calc non Af Amer: >60 mL/min (ref >60)
Glucose, Bld: 95 mg/dL (ref 65–99)
Potassium: 4.1 mmol/L (ref 3.5–5.1)
SODIUM: 140 mmol/L (ref 135–145)

## 2015-06-01 LAB — LIPID PANEL
CHOL/HDL RATIO: 5 ratio
Cholesterol: 223 mg/dL — ABNORMAL HIGH (ref 0–200)
HDL: 45 mg/dL (ref >40)
LDL CALC: 126 mg/dL — AB (ref 0–99)
Triglycerides: 258 mg/dL — ABNORMAL HIGH (ref <150)
VLDL: 52 mg/dL — AB (ref 0–40)

## 2015-06-01 LAB — TSH: TSH: 4.147 u[IU]/mL (ref 0.350–4.500)

## 2015-06-01 LAB — TROPONIN I
TROPONIN I: 0.2 ng/mL — AB (ref <0.031)
Troponin I: 0.18 ng/mL — ABNORMAL HIGH (ref <0.031)
Troponin I: 0.18 ng/mL — ABNORMAL HIGH (ref <0.031)

## 2015-06-01 LAB — MAGNESIUM: Magnesium: 2.2 mg/dL (ref 1.7–2.4)

## 2015-06-01 MED ORDER — ROSUVASTATIN CALCIUM 10 MG PO TABS
5.0000 mg | ORAL_TABLET | Freq: Every day | ORAL | Status: DC
  Administered 2015-06-01 – 2015-06-04 (×4): 5 mg via ORAL
  Filled 2015-06-01 (×6): qty 1

## 2015-06-01 MED ORDER — ZOLPIDEM TARTRATE 5 MG PO TABS
5.0000 mg | ORAL_TABLET | Freq: Every evening | ORAL | Status: DC | PRN
  Administered 2015-06-01 – 2015-06-03 (×3): 5 mg via ORAL
  Filled 2015-06-01 (×3): qty 1

## 2015-06-01 NOTE — Consult Note (Signed)
CARDIOLOGY CONSULT NOTE   Patient ID: Lawrence Hall MRN: [id number], DOB/AGE: [birth year]   Admit date: 05/31/2015 Date of Consult: 06/01/2015   Primary Physician: Marylene Land, MD Primary Cardiologist: Dr. Stanford Breed  Pt. Profile  70 year old male with past medical history of hyperlipidemia and a prostate adenocarcinoma s/p robotic-assisted laparoscopic radical prostatectomy in 07/2011 presented with chest pain and elevated trop  Problem List  Past Medical History  Diagnosis Date  . Prostate cancer ([place]) 03/27/2011    prostate bx=adenocarcinoma,  . Asthma   . Hypercholesterolemia   . Skin cancer   . ED (erectile dysfunction)   . Cataract     early signs b/l eyes  . Arthritis     mild hands  . Skin cancer 2008    basal cell face /removed  . Dysrhythmia     PAC'S  . H/O hiatal hernia   . Hypertension     MARGINALLY HIGH - NO MEDS -MONITORS  . Sleep apnea     USES NOSTRIL APARATUS - NO C-PAP- "BORDERLINE SLEEP APMNEA"    Past Surgical History  Procedure Laterality Date  . Basal cell cancer  2008    inside nose  . Thyroid surgery  age 67  . Robot assisted laparoscopic radical prostatectomy  08/10/2011    Procedure: ROBOTIC ASSISTED LAPAROSCOPIC RADICAL PROSTATECTOMY LEVEL 2;  Surgeon: Dutch Gray, MD;  Location: [facility];  Service: Urology;  Laterality: N/A;     Allergies  Allergies  Allergen Reactions  . Heparin Other (See Comments)    Blood thinner with pheresis Passed out  . Other Other (See Comments)    Pet dander, seasonal allergies    HPI   The patient is a 70 year old male with past medical history of hyperlipidemia and a prostate adenocarcinoma s/p robotic-assisted laparoscopic radical prostatectomy in 07/2011. He was referred to Dr. Stanford Breed in 07/2012 for evaluation of chest pain. Stress echo at the time was normal. He was previously on blood pressure medication and 40mg  pravachol, however had some myalgia associated with statin drug, later on his PCP has  taken him off both statin and blood pressure medication. He has not follow-up with his old PCP for 2 years. He has been recently set up to see Dr. Ernie Hew which will serve as his new PCP.   He has been doing relatively well until December. He had an episode of chest pain in December after some unpleasant conversation with his family member at [employer]. It went away without further problem. In the last week or so, he started having recurrent chest discomfort radiating down the left arm. Interestingly, he mainly noticed a symptom roughly 5 minutes after eating. It is less associated with exertion, however whenever the chest pain occur, he allowing him to rest in ordered for the chest pain didn't go away. He has not noticed any other significant exacerbating factors. On Friday, he was taking care of a small bird house in the yard when he started having chest discomfort. He says he was not doing anything strenuous. However this time the chest pain was more intense and occurred with diaphoresis. He eventually sought medical attention at [facility] Initial troponin was mildly elevated and the eventually peaked at 0.2 before trending back down. EKG showed minimal ST depression in the lateral leads. Cardiology has been consulted for NSTEMI.   Of note, he has significant family history of CAD when his brother died of sudden cardiac death. Autopsy showed significant coronary artery disease. He also had  supposedly allergy toward heparin, according to the patient, his mother had cancer back in 1980s, he was a blood donor and was given blood thinner during the pheresis procedure, he had a syncope 5 minutes after going to the procedure, however no CPR was needed. He says since then, he has not used blood thinner again.   Inpatient Medications  . aspirin  81 mg Oral QHS  . metoprolol succinate  25 mg Oral Daily  . sodium chloride flush  3 mL Intravenous Q12H    Family History Family History  Problem  Relation Age of Onset  . Prostate cancer Father 50    in his 70's/other comorbidities/79 deceased  . Breast cancer Mother 58    mets to bone,deceased age 79  . Cervical cancer Sister     2 sisters  . Cervical cancer Sister   . CAD Brother     Died of sudden cardiac death/MI     Social History Social History   Social History  . Marital Status: Married    Spouse Name: N/A  . Number of Children: N/A  . Years of Education: N/A   Occupational History  .  [employer]    customer service [employer]   Social History Main Topics  . Smoking status: Never Smoker   . Smokeless tobacco: Not on file  . Alcohol Use: Yes     Comment: Occasional  . Drug Use: No  . Sexual Activity: Not on file   Other Topics Concern  . Not on file   Social History Narrative     Review of Systems  General:  No chills, fever, night sweats or weight changes.  Cardiovascular:  No dyspnea on exertion, edema, orthopnea, palpitations, paroxysmal nocturnal dyspnea. +chest pain radiating down L arm Dermatological: No rash, lesions/masses Respiratory: No cough, dyspnea Urologic: No hematuria, dysuria Abdominal:   No nausea, vomiting, diarrhea, bright red blood per rectum, melena, or hematemesis Neurologic:  No visual changes, wkns, changes in mental status. All other systems reviewed and are otherwise negative except as noted above.  Physical Exam  Blood pressure 127/79, pulse 74, temperature 98 F (36.7 C), temperature source Oral, resp. rate 16, height 6' (1.829 m), weight 235 lb (106.595 kg), SpO2 97 %.  General: Pleasant, NAD Psych: Normal affect. Neuro: Alert and oriented X 3. Moves all extremities spontaneously. HEENT: Normal  Neck: Supple without bruits or JVD. Lungs:  Resp regular and unlabored, CTA. Heart: RRR no s3, s4, or murmurs. Abdomen: Soft, non-tender, non-distended, BS + x 4.  Extremities: No clubbing, cyanosis or edema. DP/PT/Radials 2+ and equal bilaterally.  Labs   Recent  Labs  05/31/15 1556 05/31/15 2226 06/01/15 0337 06/01/15 1202  TROPONINI 0.15* 0.18* 0.20* 0.18*   Lab Results  Component Value Date   WBC 6.1 05/31/2015   HGB 14.2 05/31/2015   HCT 41.3 05/31/2015   MCV 83.4 05/31/2015   PLT 209 05/31/2015    Recent Labs Lab 05/31/15 1529 06/01/15 0337  NA 140 140  K 3.8 4.1  CL 104 105  CO2 24 25  BUN 24* 24*  CREATININE 0.95 1.00  CALCIUM 9.3 8.7*  PROT 7.4  --   BILITOT 0.7  --   ALKPHOS 45  --   ALT 21  --   AST 21  --   GLUCOSE 140* 95   Lab Results  Component Value Date   CHOL 223* 06/01/2015   HDL 45 06/01/2015   LDLCALC 126* 06/01/2015   TRIG 258*  06/01/2015   No results found for: DDIMER  Radiology/Studies  Dg Chest 2 View  05/31/2015  CLINICAL DATA:  70 year old male with acute chest pain and shortness of breath. EXAM: CHEST  2 VIEW COMPARISON:  06/29/2012 and 02/12/2011 chest radiograph FINDINGS: The cardiomediastinal silhouette is unremarkable. Mild peribronchial thickening is unchanged. There is no evidence of focal airspace disease, pulmonary edema, suspicious pulmonary nodule/mass, pleural effusion, or pneumothorax. No acute bony abnormalities are identified. IMPRESSION: No active cardiopulmonary disease. Electronically Signed   By: Margarette Canada M.D.   On: 05/31/2015 16:15    ECG  NSR with subtle ST depression in lateral leads  ASSESSMENT AND PLAN  1. NSTEMI  - symptom atypical but has subtle EKG changes and no other explaination for elevation trop. Although he has been under a lot of stress, I do not see any sign of takotsubo cardiomyopathy on echo  - will discuss with MD regarding IV heparin, will need cardiac cath on Monday  - Risk and benefit of procedure explained to the patient who display clear understanding and agree to proceed. Discussed with patient possible procedural risk include bleeding, vascular injury, renal injury, arrythmia, MI, stroke and loss of limb or life.  - Echo 06/01/2015 normal EF,  mild to moderate AS.   2. HLD: uncontrolled, h/o of myalgia with 40mg  pravachol  3. Mild to moderate AS on echo 06/01/2015   Signed, Almyra Deforest, PA-C 06/01/2015, 3:59 PM  I have seen and examined the patient along with Almyra Deforest, PA.  I have reviewed the chart, notes and new data.  I agree with PA/NP's note.  Key new complaints: he describes a classical pattern of crescendo/unstable angina. Currently asymptomatic. Key examination changes: No HF, no arrhythmia Key new findings / data: high risk features with ECG ST changes and mildly elevated troponin  PLAN: Unstable angina, for coronary angio and possible PCI/stent on Monday. This procedure has been fully reviewed with the patient and informed consent has been obtained. I do not think he had a true allergic response to heparin - it sounds very likely to have been a vasovagal syncope during manipulation of the IV. Having said that, he has been symptom free for >24 hours. Will hold off heparin unless symptoms restart. We need to try statins again - will try low dose Crestor.   Sanda Klein, MD, [place] [phone] 06/01/2015, 4:35 PM

## 2015-06-01 NOTE — Progress Notes (Signed)
  Echocardiogram 2D Echocardiogram has been performed.  Lawrence Hall 06/01/2015, 11:06 AM

## 2015-06-01 NOTE — Progress Notes (Addendum)
PROGRESS NOTE  Lawrence Hall [id number] DOB: [birth year] DOA: 05/31/2015 PCP: Marylene Land, MD  HPI/Recap of past 24 hours:  Chest pain free, troponin trending up  Assessment/Plan: Principal Problem:   Chest pain Active Problems:   Hyperlipidemia   Arthritis   Asthma   Elevated troponin   Essential hypertension   Chest pain: associated with exertion and eating, chest pain is pressure like with diaphoresis, radiating to right shoulder. does has positive family history. with troponin elevation, continue trend troponin, vital stable, currently on asa, will check lipid panel, a1c, start statin. Cardiology consulted, patient reported history of heparin allergy? Will follow up cardiology recommendation about heparin drip.  H/o asthma, stable, no wheezing, no sob  H/o prostate cancer s/p surgery, reported cancer free by patient.  Code Status: full  Family Communication: patient   Disposition Plan: remain in the hospital, likely cardiac cath on monday   Consultants:  Cardiology Dr Recardo Evangelist  Procedures:  none  Antibiotics:  none   Objective: BP 125/71 mmHg  Pulse 75  Temp(Src) 98.5 F (36.9 C) (Oral)  Resp 18  Ht 6' (1.829 m)  Wt 106.595 kg (235 lb)  BMI 31.86 kg/m2  SpO2 96% No intake or output data in the 24 hours ending 06/01/15 0856 Filed Weights   05/31/15 1514  Weight: 106.595 kg (235 lb)    Exam:   General:  NAD  Cardiovascular: RRR  Respiratory: CTABL  Abdomen: Soft/ND/NT, positive BS  Musculoskeletal: No Edema  Neuro: aaox3  Data Reviewed: Basic Metabolic Panel:  Recent Labs Lab 05/31/15 1529 05/31/15 2336 06/01/15 0337  NA 140  --  140  K 3.8  --  4.1  CL 104  --  105  CO2 24  --  25  GLUCOSE 140*  --  95  BUN 24*  --  24*  CREATININE 0.95  --  1.00  CALCIUM 9.3  --  8.7*  MG  --  2.2  --    Liver Function Tests:  Recent Labs Lab 05/31/15 1529  AST 21  ALT 21  ALKPHOS 45  BILITOT 0.7  PROT 7.4  ALBUMIN  3.7   No results for input(s): LIPASE, AMYLASE in the last 168 hours. No results for input(s): AMMONIA in the last 168 hours. CBC:  Recent Labs Lab 05/31/15 1529  WBC 6.1  HGB 14.2  HCT 41.3  MCV 83.4  PLT 209   Cardiac Enzymes:    Recent Labs Lab 05/31/15 1556 05/31/15 2226 06/01/15 0337  TROPONINI 0.15* 0.18* 0.20*   BNP (last 3 results) No results for input(s): BNP in the last 8760 hours.  ProBNP (last 3 results) No results for input(s): PROBNP in the last 8760 hours.  CBG: No results for input(s): GLUCAP in the last 168 hours.  No results found for this or any previous visit (from the past 240 hour(s)).   Studies: Dg Chest 2 View  05/31/2015  CLINICAL DATA:  70 year old male with acute chest pain and shortness of breath. EXAM: CHEST  2 VIEW COMPARISON:  06/29/2012 and 02/12/2011 chest radiograph FINDINGS: The cardiomediastinal silhouette is unremarkable. Mild peribronchial thickening is unchanged. There is no evidence of focal airspace disease, pulmonary edema, suspicious pulmonary nodule/mass, pleural effusion, or pneumothorax. No acute bony abnormalities are identified. IMPRESSION: No active cardiopulmonary disease. Electronically Signed   By: Margarette Canada M.D.   On: 05/31/2015 16:15    Scheduled Meds: . aspirin  81 mg Oral QHS  . metoprolol succinate  25 mg  Oral Daily  . sodium chloride flush  3 mL Intravenous Q12H    Continuous Infusions:    Time spent: 59mins  Damontre Millea MD, PhD  [facility] Pager [phone]. If 7PM-7AM, please contact night-coverage at [email or web address], password [email or web address] 06/01/2015, 8:56 AM

## 2015-06-02 DIAGNOSIS — I214 Non-ST elevation (NSTEMI) myocardial infarction: Secondary | ICD-10-CM | POA: Insufficient documentation

## 2015-06-02 LAB — BASIC METABOLIC PANEL
Anion gap: 9 (ref 5–15)
BUN: 21 mg/dL — AB (ref 6–20)
CALCIUM: 8.9 mg/dL (ref 8.9–10.3)
CHLORIDE: 106 mmol/L (ref 101–111)
CO2: 24 mmol/L (ref 22–32)
CREATININE: 1.1 mg/dL (ref 0.61–1.24)
GFR calc Af Amer: >60 mL/min (ref >60)
GFR calc non Af Amer: >60 mL/min (ref >60)
Glucose, Bld: 102 mg/dL — ABNORMAL HIGH (ref 65–99)
Potassium: 3.9 mmol/L (ref 3.5–5.1)
SODIUM: 139 mmol/L (ref 135–145)

## 2015-06-02 LAB — TROPONIN I: Troponin I: 0.17 ng/mL — ABNORMAL HIGH (ref <0.031)

## 2015-06-02 LAB — MAGNESIUM: MAGNESIUM: 2.1 mg/dL (ref 1.7–2.4)

## 2015-06-02 MED ORDER — SODIUM CHLORIDE 0.9% FLUSH
3.0000 mL | Freq: Two times a day (BID) | INTRAVENOUS | Status: DC
  Administered 2015-06-02: 3 mL via INTRAVENOUS

## 2015-06-02 MED ORDER — SODIUM CHLORIDE 0.9% FLUSH
3.0000 mL | INTRAVENOUS | Status: DC | PRN
Start: 2015-06-02 — End: 2015-06-03

## 2015-06-02 MED ORDER — SODIUM CHLORIDE 0.9 % IV SOLN: 250.0000 mL | INTRAVENOUS | Status: DC | PRN

## 2015-06-02 MED ORDER — SODIUM CHLORIDE 0.9 % WEIGHT BASED INFUSION
3.0000 mL/kg/h | INTRAVENOUS | Status: DC
  Administered 2015-06-03: 3 mL/kg/h via INTRAVENOUS

## 2015-06-02 MED ORDER — SODIUM CHLORIDE 0.9 % WEIGHT BASED INFUSION
1.0000 mL/kg/h | INTRAVENOUS | Status: DC
  Administered 2015-06-03: 250 mL via INTRAVENOUS
  Administered 2015-06-03: 1 mL/kg/h via INTRAVENOUS

## 2015-06-02 MED ORDER — ASPIRIN 81 MG PO CHEW
81.0000 mg | CHEWABLE_TABLET | ORAL | Status: AC
  Administered 2015-06-03: 81 mg via ORAL

## 2015-06-02 NOTE — Progress Notes (Signed)
PROGRESS NOTE  Lawrence Hall [id number] DOB: 1945/05/18 DOA: 05/31/2015 PCP: Marylene Land, MD  HPI/Recap of past 24 hours:  Chest pain free,   Assessment/Plan: Principal Problem:   Chest pain Active Problems:   Hyperlipidemia   Arthritis   Asthma   Elevated troponin   Essential hypertension   Chest pain: associated with exertion and eating, chest pain is pressure like with diaphoresis, radiating to right shoulder. does has positive family history. with troponin elevation, continue trend troponin, vital stable, currently on asa, will check lipid panel, a1c, start statin. Cardiology consulted, patient reported history of heparin allergy? No heparin drip due to chest pain free, cardiac cath on monday  H/o asthma, stable, no wheezing, no sob  H/o prostate cancer s/p surgery, reported cancer free by patient.  Code Status: full  Family Communication: patient   Disposition Plan: remain in the hospital,  cardiac cath on monday   Consultants:  Cardiology Dr Recardo Evangelist  Procedures:  Cardiac cath 2/13  Antibiotics:  none   Objective: BP 132/71 mmHg  Pulse 72  Temp(Src) 97.6 F (36.4 C) (Oral)  Resp 18  Ht 6' (1.829 m)  Wt 106.595 kg (235 lb)  BMI 31.86 kg/m2  SpO2 98%  Intake/Output Summary (Last 24 hours) at 06/02/15 1815 Last data filed at 06/02/15 [id number]  Gross per 24 hour  Intake    240 ml  Output      0 ml  Net    240 ml   Filed Weights   05/31/15 1514  Weight: 106.595 kg (235 lb)    Exam:   General:  NAD  Cardiovascular: RRR  Respiratory: CTABL  Abdomen: Soft/ND/NT, positive BS  Musculoskeletal: No Edema  Neuro: aaox3  Data Reviewed: Basic Metabolic Panel:  Recent Labs Lab 05/31/15 1529 05/31/15 2336 06/01/15 0337 06/02/15 0218  NA 140  --  140 139  K 3.8  --  4.1 3.9  CL 104  --  105 106  CO2 24  --  25 24  GLUCOSE 140*  --  95 102*  BUN 24*  --  24* 21*  CREATININE 0.95  --  1.00 1.10  CALCIUM 9.3  --  8.7* 8.9  MG   --  2.2  --  2.1   Liver Function Tests:  Recent Labs Lab 05/31/15 1529  AST 21  ALT 21  ALKPHOS 45  BILITOT 0.7  PROT 7.4  ALBUMIN 3.7   No results for input(s): LIPASE, AMYLASE in the last 168 hours. No results for input(s): AMMONIA in the last 168 hours. CBC:  Recent Labs Lab 05/31/15 1529  WBC 6.1  HGB 14.2  HCT 41.3  MCV 83.4  PLT 209   Cardiac Enzymes:    Recent Labs Lab 05/31/15 2226 06/01/15 0337 06/01/15 1202 06/01/15 1730 06/01/15 2312  TROPONINI 0.18* 0.20* 0.18* 0.18* 0.17*   BNP (last 3 results) No results for input(s): BNP in the last 8760 hours.  ProBNP (last 3 results) No results for input(s): PROBNP in the last 8760 hours.  CBG: No results for input(s): GLUCAP in the last 168 hours.  No results found for this or any previous visit (from the past 240 hour(s)).   Studies: No results found.  Scheduled Meds: . aspirin  81 mg Oral QHS  . metoprolol succinate  25 mg Oral Daily  . rosuvastatin  5 mg Oral Daily  . sodium chloride flush  3 mL Intravenous Q12H    Continuous Infusions:    Time spent:  52mins  Kanna Dafoe MD, PhD  [facility] Pager [phone]. If 7PM-7AM, please contact night-coverage at [email or web address], password [facility] 06/02/2015, 6:15 PM

## 2015-06-02 NOTE — Progress Notes (Signed)
Patient Name: Lawrence Hall Date of Encounter: 06/02/2015   SUBJECTIVE  Feeling well. No chest pain, sob or palpitations. The patient has severe peridontals disease.   CURRENT MEDS . aspirin  81 mg Oral QHS  . metoprolol succinate  25 mg Oral Daily  . rosuvastatin  5 mg Oral Daily  . sodium chloride flush  3 mL Intravenous Q12H    OBJECTIVE  Filed Vitals:   06/01/15 0523 06/01/15 1448 06/01/15 1941 06/02/15 0518  BP: 125/71 127/79 138/75 140/79  Pulse: 75 74 76 61  Temp: 98.5 F (36.9 C) 98 F (36.7 C) 98 F (36.7 C) 97.6 F (36.4 C)  TempSrc: Oral Oral Oral Oral  Resp: 18 16 18 18   Height:      Weight:      SpO2: 96% 97% 97% 95%    Intake/Output Summary (Last 24 hours) at 06/02/15 1159 Last data filed at 06/02/15 0923  Gross per 24 hour  Intake    360 ml  Output      0 ml  Net    360 ml   Filed Weights   05/31/15 1514  Weight: 235 lb (106.595 kg)    PHYSICAL EXAM  General: Pleasant, NAD. Neuro: Alert and oriented X 3. Moves all extremities spontaneously. Psych: Normal affect. HEENT:  Normal  Neck: Supple without bruits or JVD. Lungs:  Resp regular and unlabored, CTA. Heart: RRR no s3, s4, or murmurs. Abdomen: Soft, non-tender, non-distended, BS + x 4.  Extremities: No clubbing, cyanosis or edema. DP/PT/Radials 2+ and equal bilaterally.  Accessory Clinical Findings  CBC  Recent Labs  05/31/15 1529  WBC 6.1  HGB 14.2  HCT 41.3  MCV 83.4  PLT [id number]   Basic Metabolic Panel  Recent Labs  05/31/15 2336 06/01/15 0337 06/02/15 0218  NA  --  140 139  K  --  4.1 3.9  CL  --  105 106  CO2  --  25 24  GLUCOSE  --  95 102*  BUN  --  24* 21*  CREATININE  --  1.00 1.10  CALCIUM  --  8.7* 8.9  MG 2.2  --  2.1   Liver Function Tests  Recent Labs  05/31/15 1529  AST 21  ALT 21  ALKPHOS 45  BILITOT 0.7  PROT 7.4  ALBUMIN 3.7   No results for input(s): LIPASE, AMYLASE in the last 72 hours. Cardiac Enzymes  Recent Labs  06/01/15 1202  06/01/15 1730 06/01/15 2312  TROPONINI 0.18* 0.18* 0.17*   Fasting Lipid Panel  Recent Labs  06/01/15 1202  CHOL 223*  HDL 45  LDLCALC 126*  TRIG 258*  CHOLHDL 5.0   Thyroid Function Tests  Recent Labs  06/01/15 1202  TSH 4.147    TELE  Sinus rhythm with PACs  Radiology/Studies  Dg Chest 2 View  05/31/2015  CLINICAL DATA:  70 year old male with acute chest pain and shortness of breath. EXAM: CHEST  2 VIEW COMPARISON:  06/29/2012 and 02/12/2011 chest radiograph FINDINGS: The cardiomediastinal silhouette is unremarkable. Mild peribronchial thickening is unchanged. There is no evidence of focal airspace disease, pulmonary edema, suspicious pulmonary nodule/mass, pleural effusion, or pneumothorax. No acute bony abnormalities are identified. IMPRESSION: No active cardiopulmonary disease. Electronically Signed   By: Margarette Canada M.D.   On: 05/31/2015 16:15    ASSESSMENT AND PLAN 1. NSTEMI - Peak of troponin was 0.2. Trending down. For cath tomorrow. He prefers angioplasty.  - Echo 06/01/2015 normal EF, mild to  moderate AS.  - Continue ASA, BB and statin  2. HLD: uncontrolled, h/o of myalgia with 40mg  pravachol. Started Crestor 5mg  during this admission. 06/01/2015: Cholesterol 223*; HDL 45; LDL Cholesterol 126*; Triglycerides 258*; VLDL 52*  3. Mild to moderate AS on echo 06/01/2015  Signed, Bhagat,Bhavinkumar PA-C Pager [phone]  I have seen and examined the patient along with Bhagat,Bhavinkumar PA-C.  I have reviewed the chart, notes and new data.  I agree with PA/NP's note.  Key new complaints: no further angina Key examination changes: no CHF/arrhythmia Key new findings / data: small troponin leak is trending down  PLAN: For cath +/- PCI tomorrow. This procedure has been fully reviewed with the patient and written informed consent has been obtained.   Sanda Klein, MD, [place] [phone] 06/02/2015, 12:51 PM

## 2015-06-03 ENCOUNTER — Encounter (HOSPITAL_COMMUNITY)
Admission: EM | Disposition: A | Discharge status: Home / Self Care | Payer: Self-pay | Source: Home / Self Care | Attending: Internal Medicine

## 2015-06-03 DIAGNOSIS — Z8546 Personal history of malignant neoplasm of prostate: Secondary | ICD-10-CM | POA: Diagnosis not present

## 2015-06-03 DIAGNOSIS — Z8241 Family history of sudden cardiac death: Secondary | ICD-10-CM | POA: Diagnosis not present

## 2015-06-03 DIAGNOSIS — Z888 Allergy status to other drugs, medicaments and biological substances status: Secondary | ICD-10-CM | POA: Diagnosis not present

## 2015-06-03 DIAGNOSIS — Z85828 Personal history of other malignant neoplasm of skin: Secondary | ICD-10-CM | POA: Diagnosis not present

## 2015-06-03 DIAGNOSIS — E669 Obesity, unspecified: Secondary | ICD-10-CM | POA: Diagnosis not present

## 2015-06-03 DIAGNOSIS — Z7982 Long term (current) use of aspirin: Secondary | ICD-10-CM | POA: Diagnosis not present

## 2015-06-03 DIAGNOSIS — E785 Hyperlipidemia, unspecified: Secondary | ICD-10-CM

## 2015-06-03 DIAGNOSIS — I25110 Atherosclerotic heart disease of native coronary artery with unstable angina pectoris: Secondary | ICD-10-CM

## 2015-06-03 DIAGNOSIS — Z23 Encounter for immunization: Secondary | ICD-10-CM | POA: Diagnosis not present

## 2015-06-03 DIAGNOSIS — I214 Non-ST elevation (NSTEMI) myocardial infarction: Secondary | ICD-10-CM | POA: Diagnosis not present

## 2015-06-03 DIAGNOSIS — R079 Chest pain, unspecified: Secondary | ICD-10-CM | POA: Diagnosis not present

## 2015-06-03 DIAGNOSIS — I209 Angina pectoris, unspecified: Secondary | ICD-10-CM | POA: Diagnosis not present

## 2015-06-03 DIAGNOSIS — M1990 Unspecified osteoarthritis, unspecified site: Secondary | ICD-10-CM | POA: Diagnosis not present

## 2015-06-03 DIAGNOSIS — K219 Gastro-esophageal reflux disease without esophagitis: Secondary | ICD-10-CM | POA: Diagnosis not present

## 2015-06-03 DIAGNOSIS — E7800 Pure hypercholesterolemia, unspecified: Secondary | ICD-10-CM | POA: Diagnosis not present

## 2015-06-03 DIAGNOSIS — G4730 Sleep apnea, unspecified: Secondary | ICD-10-CM | POA: Diagnosis not present

## 2015-06-03 DIAGNOSIS — I350 Nonrheumatic aortic (valve) stenosis: Secondary | ICD-10-CM | POA: Insufficient documentation

## 2015-06-03 DIAGNOSIS — I200 Unstable angina: Secondary | ICD-10-CM | POA: Diagnosis not present

## 2015-06-03 DIAGNOSIS — R0600 Dyspnea, unspecified: Secondary | ICD-10-CM | POA: Diagnosis not present

## 2015-06-03 DIAGNOSIS — R0602 Shortness of breath: Secondary | ICD-10-CM | POA: Diagnosis not present

## 2015-06-03 DIAGNOSIS — Z8042 Family history of malignant neoplasm of prostate: Secondary | ICD-10-CM | POA: Diagnosis not present

## 2015-06-03 DIAGNOSIS — I10 Essential (primary) hypertension: Secondary | ICD-10-CM | POA: Diagnosis not present

## 2015-06-03 DIAGNOSIS — Z9079 Acquired absence of other genital organ(s): Secondary | ICD-10-CM | POA: Diagnosis not present

## 2015-06-03 DIAGNOSIS — Z6831 Body mass index (BMI) 31.0-31.9, adult: Secondary | ICD-10-CM | POA: Diagnosis not present

## 2015-06-03 DIAGNOSIS — R7989 Other specified abnormal findings of blood chemistry: Secondary | ICD-10-CM | POA: Diagnosis not present

## 2015-06-03 DIAGNOSIS — J45909 Unspecified asthma, uncomplicated: Secondary | ICD-10-CM | POA: Diagnosis not present

## 2015-06-03 DIAGNOSIS — Z8249 Family history of ischemic heart disease and other diseases of the circulatory system: Secondary | ICD-10-CM | POA: Diagnosis not present

## 2015-06-03 HISTORY — PX: CARDIAC CATHETERIZATION: SHX172

## 2015-06-03 LAB — BASIC METABOLIC PANEL
Anion gap: 9 (ref 5–15)
BUN: 23 mg/dL — ABNORMAL HIGH (ref 6–20)
CHLORIDE: 104 mmol/L (ref 101–111)
CO2: 26 mmol/L (ref 22–32)
CREATININE: 1.12 mg/dL (ref 0.61–1.24)
Calcium: 9.2 mg/dL (ref 8.9–10.3)
GFR calc non Af Amer: >60 mL/min (ref >60)
Glucose, Bld: 99 mg/dL (ref 65–99)
POTASSIUM: 4.2 mmol/L (ref 3.5–5.1)
SODIUM: 139 mmol/L (ref 135–145)

## 2015-06-03 LAB — POCT ACTIVATED CLOTTING TIME: ACTIVATED CLOTTING TIME: 533 s

## 2015-06-03 SURGERY — LEFT HEART CATH AND CORONARY ANGIOGRAPHY
Anesthesia: LOCAL

## 2015-06-03 MED ORDER — ATORVASTATIN CALCIUM 80 MG PO TABS
80.0000 mg | ORAL_TABLET | Freq: Every day | ORAL | Status: DC
  Administered 2015-06-03: 80 mg via ORAL
  Filled 2015-06-03: qty 1

## 2015-06-03 MED ORDER — ANGIOPLASTY BOOK
Freq: Once | Status: AC
  Administered 2015-06-03: 14:00:00
  Filled 2015-06-03: qty 1

## 2015-06-03 MED ORDER — LIDOCAINE HCL (PF) 1 % IJ SOLN
INTRAMUSCULAR | Status: DC | PRN
  Administered 2015-06-03: 5 mL via INTRADERMAL

## 2015-06-03 MED ORDER — TICAGRELOR 90 MG PO TABS
ORAL_TABLET | ORAL | Status: AC
  Filled 2015-06-03: qty 2

## 2015-06-03 MED ORDER — HEPARIN (PORCINE) IN NACL 2-0.9 UNIT/ML-% IJ SOLN
INTRAMUSCULAR | Status: DC | PRN
  Administered 2015-06-03: 1000 mL

## 2015-06-03 MED ORDER — LIDOCAINE HCL (PF) 1 % IJ SOLN
INTRAMUSCULAR | Status: AC
  Filled 2015-06-03: qty 30

## 2015-06-03 MED ORDER — MIDAZOLAM HCL 2 MG/2ML IJ SOLN
INTRAMUSCULAR | Status: AC
  Filled 2015-06-03: qty 2

## 2015-06-03 MED ORDER — HEPARIN (PORCINE) IN NACL 2-0.9 UNIT/ML-% IJ SOLN
INTRAMUSCULAR | Status: AC
  Filled 2015-06-03: qty 1000

## 2015-06-03 MED ORDER — VERAPAMIL HCL 2.5 MG/ML IV SOLN
INTRAVENOUS | Status: AC
  Filled 2015-06-03: qty 2

## 2015-06-03 MED ORDER — ONDANSETRON HCL 4 MG/2ML IJ SOLN
INTRAMUSCULAR | Status: AC
  Filled 2015-06-03: qty 2

## 2015-06-03 MED ORDER — TICAGRELOR 90 MG PO TABS
ORAL_TABLET | ORAL | Status: DC | PRN
  Administered 2015-06-03: 180 mg via ORAL

## 2015-06-03 MED ORDER — BIVALIRUDIN BOLUS VIA INFUSION - CUPID
INTRAVENOUS | Status: DC | PRN
  Administered 2015-06-03: 79.95 mg via INTRAVENOUS

## 2015-06-03 MED ORDER — ONDANSETRON HCL 4 MG/2ML IJ SOLN: 4.0000 mg | Freq: Four times a day (QID) | INTRAMUSCULAR | Status: DC | PRN

## 2015-06-03 MED ORDER — HEPARIN SODIUM (PORCINE) 1000 UNIT/ML IJ SOLN
INTRAMUSCULAR | Status: DC | PRN
Start: 2015-06-03 — End: 2015-06-03
  Administered 2015-06-03: 5000 [IU] via INTRAVENOUS

## 2015-06-03 MED ORDER — BIVALIRUDIN 250 MG IV SOLR
INTRAVENOUS | Status: AC
  Filled 2015-06-03: qty 250

## 2015-06-03 MED ORDER — SODIUM CHLORIDE 0.9 % IV SOLN
INTRAVENOUS | Status: AC
  Administered 2015-06-03: 15:00:00 via INTRAVENOUS

## 2015-06-03 MED ORDER — MIDAZOLAM HCL 2 MG/2ML IJ SOLN
INTRAMUSCULAR | Status: DC | PRN
  Administered 2015-06-03: 2 mg via INTRAVENOUS
  Administered 2015-06-03: 1 mg via INTRAVENOUS

## 2015-06-03 MED ORDER — SODIUM CHLORIDE 0.9 % IV SOLN: 250.0000 mL | INTRAVENOUS | Status: DC | PRN

## 2015-06-03 MED ORDER — FENTANYL CITRATE (PF) 100 MCG/2ML IJ SOLN
INTRAMUSCULAR | Status: AC
  Filled 2015-06-03: qty 2

## 2015-06-03 MED ORDER — SODIUM CHLORIDE 0.9% FLUSH
3.0000 mL | Freq: Two times a day (BID) | INTRAVENOUS | Status: DC
  Administered 2015-06-03: 15:00:00 3 mL via INTRAVENOUS

## 2015-06-03 MED ORDER — TICAGRELOR 90 MG PO TABS
90.0000 mg | ORAL_TABLET | Freq: Two times a day (BID) | ORAL | Status: DC
  Administered 2015-06-03 – 2015-06-04 (×2): 90 mg via ORAL
  Filled 2015-06-03 (×2): qty 1

## 2015-06-03 MED ORDER — NITROGLYCERIN 1 MG/10 ML FOR IR/CATH LAB
INTRA_ARTERIAL | Status: DC | PRN
  Administered 2015-06-03: 100 ug via INTRACORONARY
  Administered 2015-06-03: 200 ug via INTRACORONARY
  Administered 2015-06-03: 100 ug via INTRACORONARY

## 2015-06-03 MED ORDER — ASPIRIN EC 81 MG PO TBEC
81.0000 mg | DELAYED_RELEASE_TABLET | Freq: Every day | ORAL | Status: DC
  Administered 2015-06-04: 11:00:00 81 mg via ORAL
  Filled 2015-06-03: qty 1

## 2015-06-03 MED ORDER — SODIUM CHLORIDE 0.9 % IV SOLN
250.0000 mg | INTRAVENOUS | Status: DC | PRN
  Administered 2015-06-03 (×2): 1.75 mg/kg/h via INTRAVENOUS

## 2015-06-03 MED ORDER — ONDANSETRON HCL 4 MG/2ML IJ SOLN
INTRAMUSCULAR | Status: DC | PRN
  Administered 2015-06-03: 4 mg via INTRAVENOUS

## 2015-06-03 MED ORDER — IOHEXOL 350 MG/ML SOLN
INTRAVENOUS | Status: DC | PRN
  Administered 2015-06-03: 220 mL via INTRAVENOUS

## 2015-06-03 MED ORDER — FENTANYL CITRATE (PF) 100 MCG/2ML IJ SOLN
INTRAMUSCULAR | Status: DC | PRN
  Administered 2015-06-03 (×2): 25 ug via INTRAVENOUS

## 2015-06-03 MED ORDER — ACETAMINOPHEN 325 MG PO TABS: 650.0000 mg | ORAL_TABLET | ORAL | Status: DC | PRN

## 2015-06-03 MED ORDER — DOPAMINE-DEXTROSE 3.2-5 MG/ML-% IV SOLN
INTRAVENOUS | Status: DC | PRN
Start: 2015-06-03 — End: 2015-06-03
  Administered 2015-06-03: 5 ug/kg/min via INTRAVENOUS

## 2015-06-03 MED ORDER — DOPAMINE-DEXTROSE 3.2-5 MG/ML-% IV SOLN
INTRAVENOUS | Status: AC
  Filled 2015-06-03: qty 250

## 2015-06-03 MED ORDER — SODIUM CHLORIDE 0.9% FLUSH: 3.0000 mL | INTRAVENOUS | Status: DC | PRN

## 2015-06-03 MED ORDER — NITROGLYCERIN 1 MG/10 ML FOR IR/CATH LAB
INTRA_ARTERIAL | Status: AC
  Filled 2015-06-03: qty 10

## 2015-06-03 MED ORDER — VERAPAMIL HCL 2.5 MG/ML IV SOLN
INTRA_ARTERIAL | Status: DC | PRN
  Administered 2015-06-03: 15 mL via INTRA_ARTERIAL

## 2015-06-03 MED ORDER — INFLUENZA VAC SPLIT QUAD 0.5 ML IM SUSY
0.5000 mL | PREFILLED_SYRINGE | INTRAMUSCULAR | Status: AC
  Administered 2015-06-04: 12:00:00 0.5 mL via INTRAMUSCULAR
  Filled 2015-06-03 (×2): qty 0.5

## 2015-06-03 SURGICAL SUPPLY — 19 items
BALLN EMERGE MR 2.5X20 (BALLOONS) ×2
BALLN ~~LOC~~ TREK RX 3.75X20 (BALLOONS) ×2 IMPLANT
BALLOON EMERGE MR 2.5X20 (BALLOONS) ×1 IMPLANT
CATH INFINITI 5 FR JL3.5 (CATHETERS) ×2 IMPLANT
CATH INFINITI 5FR ANG PIGTAIL (CATHETERS) ×2 IMPLANT
CATH INFINITI 5FR JL4 (CATHETERS) ×2 IMPLANT
CATH INFINITI JR4 5F (CATHETERS) ×2 IMPLANT
CATH VISTA GUIDE 6FR XB4 (CATHETERS) ×2 IMPLANT
DEVICE RAD COMP TR BAND LRG (VASCULAR PRODUCTS) ×2 IMPLANT
GLIDESHEATH SLEND A-KIT 6F 22G (SHEATH) ×2 IMPLANT
KIT ENCORE 26 ADVANTAGE (KITS) ×2 IMPLANT
KIT HEART LEFT (KITS) ×2 IMPLANT
PACK CARDIAC CATHETERIZATION (CUSTOM PROCEDURE TRAY) ×2 IMPLANT
STENT RESOLUTE INTEG 3.5X34 (Permanent Stent) ×2 IMPLANT
SYR MEDRAD MARK V 150ML (SYRINGE) ×2 IMPLANT
TRANSDUCER W/STOPCOCK (MISCELLANEOUS) ×2 IMPLANT
TUBING CIL FLEX 10 FLL-RA (TUBING) ×2 IMPLANT
WIRE COUGAR XT STRL 190CM (WIRE) ×2 IMPLANT
WIRE SAFE-T 1.5MM-J .035X260CM (WIRE) ×2 IMPLANT

## 2015-06-03 NOTE — Progress Notes (Signed)
PROGRESS NOTE  Lawrence NELLI [id number] DOB: Sep 06, 1945 DOA: 05/31/2015 PCP: Marylene Land, MD  HPI/Recap of past 24 hours:  Returned from cardiac cath, Chest pain free, vital stable, wife in room  Assessment/Plan: Principal Problem:   Chest pain Active Problems:   Hyperlipidemia   Arthritis   Asthma   Elevated troponin   Essential hypertension   Non-STEMI (non-ST elevated myocardial infarction) (HCC)   NSTEMI (non-ST elevated myocardial infarction) ([place])   Aortic stenosis   Chest pain:  associated with exertion and eating, chest pain is pressure like with diaphoresis, radiating to right shoulder. does has positive family history. with troponin elevation, continue trend troponin, vital stable, currently on asa,started statin.  S/p cardiac cath and stenting, management per cardiology  HLD: statin  H/o asthma, stable, no wheezing, no sob  H/o prostate cancer s/p surgery, reported cancer free by patient.  Code Status: full  Family Communication: patient and wife  Disposition Plan: home 2/14   Consultants:  Cardiology Dr Recardo Evangelist  Procedures:  Cardiac cath 2/13  Antibiotics:  none   Objective: BP 159/79 mmHg  Pulse 72  Temp(Src) 98 F (36.7 C) (Oral)  Resp 25  Ht 6' (1.829 m)  Wt 106.595 kg (235 lb)  BMI 31.86 kg/m2  SpO2 94%  Intake/Output Summary (Last 24 hours) at 06/03/15 1934 Last data filed at 06/03/15 1800  Gross per 24 hour  Intake 1175.52 ml  Output    900 ml  Net 275.52 ml   Filed Weights   05/31/15 1514  Weight: 106.595 kg (235 lb)    Exam:   General:  NAD  Cardiovascular: RRR  Respiratory: CTABL  Abdomen: Soft/ND/NT, positive BS  Musculoskeletal: No Edema  Neuro: aaox3  Data Reviewed: Basic Metabolic Panel:  Recent Labs Lab 05/31/15 1529 05/31/15 2336 06/01/15 0337 06/02/15 0218 06/03/15 0227  NA 140  --  140 139 139  K 3.8  --  4.1 3.9 4.2  CL 104  --  105 106 104  CO2 24  --  25 24 26   GLUCOSE  140*  --  95 102* 99  BUN 24*  --  24* 21* 23*  CREATININE 0.95  --  1.00 1.10 1.12  CALCIUM 9.3  --  8.7* 8.9 9.2  MG  --  2.2  --  2.1  --    Liver Function Tests:  Recent Labs Lab 05/31/15 1529  AST 21  ALT 21  ALKPHOS 45  BILITOT 0.7  PROT 7.4  ALBUMIN 3.7   No results for input(s): LIPASE, AMYLASE in the last 168 hours. No results for input(s): AMMONIA in the last 168 hours. CBC:  Recent Labs Lab 05/31/15 1529  WBC 6.1  HGB 14.2  HCT 41.3  MCV 83.4  PLT 209   Cardiac Enzymes:    Recent Labs Lab 05/31/15 2226 06/01/15 0337 06/01/15 1202 06/01/15 1730 06/01/15 2312  TROPONINI 0.18* 0.20* 0.18* 0.18* 0.17*   BNP (last 3 results) No results for input(s): BNP in the last 8760 hours.  ProBNP (last 3 results) No results for input(s): PROBNP in the last 8760 hours.  CBG: No results for input(s): GLUCAP in the last 168 hours.  No results found for this or any previous visit (from the past 240 hour(s)).   Studies: No results found.  Scheduled Meds: . [START ON 06/04/2015] aspirin EC  81 mg Oral Daily  . atorvastatin  80 mg Oral q1800  . metoprolol succinate  25 mg Oral Daily  .  rosuvastatin  5 mg Oral Daily  . sodium chloride flush  3 mL Intravenous Q12H  . sodium chloride flush  3 mL Intravenous Q12H  . ticagrelor  90 mg Oral BID    Continuous Infusions:    Time spent: 36mins  Adler Chartrand MD, PhD  [facility] Pager [phone]. If 7PM-7AM, please contact night-coverage at [email or web address], password [facility] 06/03/2015, 7:34 PM  LOS: 0 days

## 2015-06-03 NOTE — Progress Notes (Signed)
Right TR Band removed. Palpable 2t radial and ulnar pulses. Good csm. Level 0. Pt is aware of mobility precautions. Tolerated good.

## 2015-06-03 NOTE — H&P (View-Only) (Signed)
Patient Name: Lawrence Hall Date of Encounter: 06/02/2015   SUBJECTIVE  Feeling well. No chest pain, sob or palpitations. The patient has severe peridontals disease.   CURRENT MEDS . aspirin  81 mg Oral QHS  . metoprolol succinate  25 mg Oral Daily  . rosuvastatin  5 mg Oral Daily  . sodium chloride flush  3 mL Intravenous Q12H    OBJECTIVE  Filed Vitals:   06/01/15 0523 06/01/15 1448 06/01/15 1941 06/02/15 0518  BP: 125/71 127/79 138/75 140/79  Pulse: 75 74 76 61  Temp: 98.5 F (36.9 C) 98 F (36.7 C) 98 F (36.7 C) 97.6 F (36.4 C)  TempSrc: Oral Oral Oral Oral  Resp: 18 16 18 18   Height:      Weight:      SpO2: 96% 97% 97% 95%    Intake/Output Summary (Last 24 hours) at 06/02/15 1159 Last data filed at 06/02/15 0923  Gross per 24 hour  Intake    360 ml  Output      0 ml  Net    360 ml   Filed Weights   05/31/15 1514  Weight: 235 lb (106.595 kg)    PHYSICAL EXAM  General: Pleasant, NAD. Neuro: Alert and oriented X 3. Moves all extremities spontaneously. Psych: Normal affect. HEENT:  Normal  Neck: Supple without bruits or JVD. Lungs:  Resp regular and unlabored, CTA. Heart: RRR no s3, s4, or murmurs. Abdomen: Soft, non-tender, non-distended, BS + x 4.  Extremities: No clubbing, cyanosis or edema. DP/PT/Radials 2+ and equal bilaterally.  Accessory Clinical Findings  CBC  Recent Labs  05/31/15 1529  WBC 6.1  HGB 14.2  HCT 41.3  MCV 83.4  PLT [id number]   Basic Metabolic Panel  Recent Labs  05/31/15 2336 06/01/15 0337 06/02/15 0218  NA  --  140 139  K  --  4.1 3.9  CL  --  105 106  CO2  --  25 24  GLUCOSE  --  95 102*  BUN  --  24* 21*  CREATININE  --  1.00 1.10  CALCIUM  --  8.7* 8.9  MG 2.2  --  2.1   Liver Function Tests  Recent Labs  05/31/15 1529  AST 21  ALT 21  ALKPHOS 45  BILITOT 0.7  PROT 7.4  ALBUMIN 3.7   No results for input(s): LIPASE, AMYLASE in the last 72 hours. Cardiac Enzymes  Recent Labs  06/01/15 1202  06/01/15 1730 06/01/15 2312  TROPONINI 0.18* 0.18* 0.17*   Fasting Lipid Panel  Recent Labs  06/01/15 1202  CHOL 223*  HDL 45  LDLCALC 126*  TRIG 258*  CHOLHDL 5.0   Thyroid Function Tests  Recent Labs  06/01/15 1202  TSH 4.147    TELE  Sinus rhythm with PACs  Radiology/Studies  Dg Chest 2 View  05/31/2015  CLINICAL DATA:  70 year old male with acute chest pain and shortness of breath. EXAM: CHEST  2 VIEW COMPARISON:  06/29/2012 and 02/12/2011 chest radiograph FINDINGS: The cardiomediastinal silhouette is unremarkable. Mild peribronchial thickening is unchanged. There is no evidence of focal airspace disease, pulmonary edema, suspicious pulmonary nodule/mass, pleural effusion, or pneumothorax. No acute bony abnormalities are identified. IMPRESSION: No active cardiopulmonary disease. Electronically Signed   By: Margarette Canada M.D.   On: 05/31/2015 16:15    ASSESSMENT AND PLAN 1. NSTEMI - Peak of troponin was 0.2. Trending down. For cath tomorrow. He prefers angioplasty.  - Echo 06/01/2015 normal EF, mild to  moderate AS.  - Continue ASA, BB and statin  2. HLD: uncontrolled, h/o of myalgia with 40mg  pravachol. Started Crestor 5mg  during this admission. 06/01/2015: Cholesterol 223*; HDL 45; LDL Cholesterol 126*; Triglycerides 258*; VLDL 52*  3. Mild to moderate AS on echo 06/01/2015  Signed, Bhagat,Bhavinkumar PA-C Pager [phone]  I have seen and examined the patient along with Bhagat,Bhavinkumar PA-C.  I have reviewed the chart, notes and new data.  I agree with PA/NP's note.  Key new complaints: no further angina Key examination changes: no CHF/arrhythmia Key new findings / data: small troponin leak is trending down  PLAN: For cath +/- PCI tomorrow. This procedure has been fully reviewed with the patient and written informed consent has been obtained.   Sanda Klein, MD, [place] [phone] 06/02/2015, 12:51 PM

## 2015-06-03 NOTE — Interval H&P Note (Signed)
Cath Lab Visit (complete for each Cath Lab visit)  Clinical Evaluation Leading to the Procedure:   ACS: Yes.    Non-ACS:    Anginal Classification: CCS III  Anti-ischemic medical therapy: Minimal Therapy (1 class of medications)  Non-Invasive Test Results: No non-invasive testing performed  Prior CABG: No previous CABG      History and Physical Interval Note:  06/03/2015 9:13 AM  Lawrence Hall  has presented today for surgery, with the diagnosis of NSTEMI  The various methods of treatment have been discussed with the patient and family. After consideration of risks, benefits and other options for treatment, the patient has consented to  Procedure(s): Left Heart Cath and Coronary Angiography (N/A) as a surgical intervention .  The patient's history has been reviewed, patient examined, no change in status, stable for surgery.  I have reviewed the patient's chart and labs.  Questions were answered to the patient's satisfaction.     KELLY,THOMAS A

## 2015-06-03 NOTE — Progress Notes (Signed)
Patient Name: Lawrence Hall Date of Encounter: 06/03/2015   SUBJECTIVE  The patient has severe peridontals disease. One episode of cp last night. No SOB.   CURRENT MEDS . aspirin  81 mg Oral QHS  . metoprolol succinate  25 mg Oral Daily  . rosuvastatin  5 mg Oral Daily  . sodium chloride flush  3 mL Intravenous Q12H  . sodium chloride flush  3 mL Intravenous Q12H    OBJECTIVE  Filed Vitals:   06/02/15 0518 06/02/15 1335 06/02/15 1935 06/03/15 0449  BP: 140/79 132/71 157/79 132/68  Pulse: 61 72 76 67  Temp: 97.6 F (36.4 C) 97.6 F (36.4 C) 97.6 F (36.4 C) 97.8 F (36.6 C)  TempSrc: Oral Oral Oral Oral  Resp: 18 18 18 18   Height:      Weight:      SpO2: 95% 98% 97% 96%    Intake/Output Summary (Last 24 hours) at 06/03/15 0742 Last data filed at 06/02/15 [id number]  Gross per 24 hour  Intake    240 ml  Output      0 ml  Net    240 ml   Filed Weights   05/31/15 1514  Weight: 235 lb (106.595 kg)    PHYSICAL EXAM  General: Pleasant, NAD. Neuro: Alert and oriented X 3. Moves all extremities spontaneously. Psych: Normal affect. HEENT:  Normal  Neck: Supple without bruits or JVD. Lungs:  Resp regular and unlabored, CTA. Heart: RRR no s3, s4, or murmurs. Abdomen: Soft, non-tender, non-distended, BS + x 4.  Extremities: No clubbing, cyanosis or edema. DP/PT/Radials 2+ and equal bilaterally.  Accessory Clinical Findings  CBC  Recent Labs  05/31/15 1529  WBC 6.1  HGB 14.2  HCT 41.3  MCV 83.4  PLT [id number]   Basic Metabolic Panel  Recent Labs  05/31/15 2336  06/02/15 0218 06/03/15 0227  NA  --   < > 139 139  K  --   < > 3.9 4.2  CL  --   < > 106 104  CO2  --   < > 24 26  GLUCOSE  --   < > 102* 99  BUN  --   < > 21* 23*  CREATININE  --   < > 1.10 1.12  CALCIUM  --   < > 8.9 9.2  MG 2.2  --  2.1  --   < > = values in this interval not displayed. Liver Function Tests  Recent Labs  05/31/15 1529  AST 21  ALT 21  ALKPHOS 45  BILITOT 0.7  PROT 7.4   ALBUMIN 3.7   No results for input(s): LIPASE, AMYLASE in the last 72 hours. Cardiac Enzymes  Recent Labs  06/01/15 1202 06/01/15 1730 06/01/15 2312  TROPONINI 0.18* 0.18* 0.17*   Fasting Lipid Panel  Recent Labs  06/01/15 1202  CHOL 223*  HDL 45  LDLCALC 126*  TRIG 258*  CHOLHDL 5.0   Thyroid Function Tests  Recent Labs  06/01/15 1202  TSH 4.147    TELE  Sinus rhythm with PACs  Radiology/Studies  Dg Chest 2 View  05/31/2015  CLINICAL DATA:  70 year old male with acute chest pain and shortness of breath. EXAM: CHEST  2 VIEW COMPARISON:  06/29/2012 and 02/12/2011 chest radiograph FINDINGS: The cardiomediastinal silhouette is unremarkable. Mild peribronchial thickening is unchanged. There is no evidence of focal airspace disease, pulmonary edema, suspicious pulmonary nodule/mass, pleural effusion, or pneumothorax. No acute bony abnormalities are identified. IMPRESSION: No active  cardiopulmonary disease. Electronically Signed   By: Margarette Canada M.D.   On: 05/31/2015 16:15    ASSESSMENT AND PLAN 1. NSTEMI - Peak of troponin was 0.2. Trending down. For cath today.  - Echo 06/01/2015 normal EF, mild to moderate AS.  - Continue ASA, BB and statin  2. HLD: uncontrolled, h/o of myalgia with 40mg  pravachol. Started Crestor 5mg  during this admission. 06/01/2015: Cholesterol 223*; HDL 45; LDL Cholesterol 126*; Triglycerides 258*; VLDL 52*  3. Mild to moderate AS on echo 06/01/2015  Jarrett Soho PA-C Pager [phone]   Patient in cath lab    [facility]

## 2015-06-04 ENCOUNTER — Encounter (HOSPITAL_COMMUNITY): Payer: Self-pay | Admitting: Cardiovascular Disease

## 2015-06-04 ENCOUNTER — Telehealth: Payer: Self-pay | Admitting: Cardiology

## 2015-06-04 DIAGNOSIS — M1990 Unspecified osteoarthritis, unspecified site: Secondary | ICD-10-CM | POA: Diagnosis not present

## 2015-06-04 DIAGNOSIS — I214 Non-ST elevation (NSTEMI) myocardial infarction: Secondary | ICD-10-CM | POA: Diagnosis not present

## 2015-06-04 DIAGNOSIS — G4730 Sleep apnea, unspecified: Secondary | ICD-10-CM | POA: Diagnosis not present

## 2015-06-04 DIAGNOSIS — Z8241 Family history of sudden cardiac death: Secondary | ICD-10-CM | POA: Diagnosis not present

## 2015-06-04 DIAGNOSIS — Z23 Encounter for immunization: Secondary | ICD-10-CM | POA: Diagnosis not present

## 2015-06-04 DIAGNOSIS — I10 Essential (primary) hypertension: Secondary | ICD-10-CM

## 2015-06-04 DIAGNOSIS — Z85828 Personal history of other malignant neoplasm of skin: Secondary | ICD-10-CM | POA: Diagnosis not present

## 2015-06-04 DIAGNOSIS — R7989 Other specified abnormal findings of blood chemistry: Secondary | ICD-10-CM

## 2015-06-04 DIAGNOSIS — Z8249 Family history of ischemic heart disease and other diseases of the circulatory system: Secondary | ICD-10-CM | POA: Diagnosis not present

## 2015-06-04 DIAGNOSIS — Z8546 Personal history of malignant neoplasm of prostate: Secondary | ICD-10-CM | POA: Diagnosis not present

## 2015-06-04 DIAGNOSIS — I350 Nonrheumatic aortic (valve) stenosis: Secondary | ICD-10-CM

## 2015-06-04 DIAGNOSIS — Z7982 Long term (current) use of aspirin: Secondary | ICD-10-CM | POA: Diagnosis not present

## 2015-06-04 DIAGNOSIS — J45909 Unspecified asthma, uncomplicated: Secondary | ICD-10-CM | POA: Diagnosis not present

## 2015-06-04 DIAGNOSIS — E785 Hyperlipidemia, unspecified: Secondary | ICD-10-CM | POA: Diagnosis not present

## 2015-06-04 DIAGNOSIS — Z6831 Body mass index (BMI) 31.0-31.9, adult: Secondary | ICD-10-CM | POA: Diagnosis not present

## 2015-06-04 DIAGNOSIS — E7800 Pure hypercholesterolemia, unspecified: Secondary | ICD-10-CM | POA: Diagnosis not present

## 2015-06-04 DIAGNOSIS — Z9079 Acquired absence of other genital organ(s): Secondary | ICD-10-CM | POA: Diagnosis not present

## 2015-06-04 DIAGNOSIS — Z8042 Family history of malignant neoplasm of prostate: Secondary | ICD-10-CM | POA: Diagnosis not present

## 2015-06-04 DIAGNOSIS — E669 Obesity, unspecified: Secondary | ICD-10-CM | POA: Diagnosis not present

## 2015-06-04 DIAGNOSIS — Z888 Allergy status to other drugs, medicaments and biological substances status: Secondary | ICD-10-CM | POA: Diagnosis not present

## 2015-06-04 DIAGNOSIS — I25110 Atherosclerotic heart disease of native coronary artery with unstable angina pectoris: Secondary | ICD-10-CM | POA: Diagnosis not present

## 2015-06-04 LAB — CBC
HCT: 39.3 % (ref 39.0–52.0)
Hemoglobin: 13.3 g/dL (ref 13.0–17.0)
MCH: 28.3 pg (ref 26.0–34.0)
MCHC: 33.8 g/dL (ref 30.0–36.0)
MCV: 83.6 fL (ref 78.0–100.0)
Platelets: 212 K/uL (ref 150–400)
RBC: 4.7 MIL/uL (ref 4.22–5.81)
RDW: 13.1 % (ref 11.5–15.5)
WBC: 8.9 K/uL (ref 4.0–10.5)

## 2015-06-04 LAB — BASIC METABOLIC PANEL
ANION GAP: 8 (ref 5–15)
BUN: 18 mg/dL (ref 6–20)
CHLORIDE: 107 mmol/L (ref 101–111)
CO2: 23 mmol/L (ref 22–32)
Calcium: 8.7 mg/dL — ABNORMAL LOW (ref 8.9–10.3)
Creatinine, Ser: 1.08 mg/dL (ref 0.61–1.24)
GFR calc Af Amer: >60 mL/min (ref >60)
Glucose, Bld: 88 mg/dL (ref 65–99)
POTASSIUM: 3.7 mmol/L (ref 3.5–5.1)
SODIUM: 138 mmol/L (ref 135–145)

## 2015-06-04 MED ORDER — METOPROLOL SUCCINATE ER 25 MG PO TB24: 25.0000 mg | ORAL_TABLET | Freq: Every day | ORAL | Status: DC

## 2015-06-04 MED ORDER — TICAGRELOR 90 MG PO TABS: 90.0000 mg | ORAL_TABLET | Freq: Two times a day (BID) | ORAL | Status: DC

## 2015-06-04 MED ORDER — ROSUVASTATIN CALCIUM 5 MG PO TABS: 5.0000 mg | ORAL_TABLET | Freq: Every day | ORAL | Status: DC

## 2015-06-04 MED ORDER — ASPIRIN 81 MG PO TBEC: 81.0000 mg | DELAYED_RELEASE_TABLET | Freq: Every day | ORAL | Status: DC

## 2015-06-04 NOTE — Telephone Encounter (Signed)
New message    TCM appt on  2.22.2017 with Tarri Fuller per [place] PA.

## 2015-06-04 NOTE — Progress Notes (Signed)
Patient ID: CACE ORLOV, male   DOB: [birth year], 70 y.o.   MRN: [id number]   Patient Name: Lawrence Hall Date of Encounter: 06/04/2015   SUBJECTIVE  Post PCI no chest pain   CURRENT MEDS . aspirin EC  81 mg Oral Daily  . atorvastatin  80 mg Oral q1800  . Influenza vac split quadrivalent PF  0.5 mL Intramuscular Tomorrow-1000  . metoprolol succinate  25 mg Oral Daily  . rosuvastatin  5 mg Oral Daily  . sodium chloride flush  3 mL Intravenous Q12H  . ticagrelor  90 mg Oral BID    OBJECTIVE  Filed Vitals:   06/03/15 1600 06/03/15 1700 06/03/15 2144 06/04/15 0452  BP: 148/81 159/79 146/88 151/80  Pulse: 64 72 71 79  Temp: 98 F (36.7 C)  98.4 F (36.9 C) 97.9 F (36.6 C)  TempSrc: Oral  Oral Oral  Resp: 21 25 24 20   Height:      Weight:    107.5 kg (236 lb 15.9 oz)  SpO2: 95% 94% 97% 95%    Intake/Output Summary (Last 24 hours) at 06/04/15 0747 Last data filed at 06/04/15 0455  Gross per 24 hour  Intake 1675.52 ml  Output   1600 ml  Net  75.52 ml   Filed Weights   05/31/15 1514 06/04/15 0452  Weight: 106.595 kg (235 lb) 107.5 kg (236 lb 15.9 oz)    PHYSICAL EXAM  General: Pleasant, NAD. Neuro: Alert and oriented X 3. Moves all extremities spontaneously. Psych: Normal affect. HEENT:  Normal  Neck: Supple without bruits or JVD. Lungs:  Resp regular and unlabored, CTA. Heart: RRR no s3, s4, or murmurs. Abdomen: Soft, non-tender, non-distended, BS + x 4.  Extremities: No clubbing, cyanosis or edema. DP/PT/Radials 2+ and equal bilaterally. Right radial cath sight A   Accessory Clinical Findings  CBC  Recent Labs  06/04/15 0424  WBC 8.9  HGB 13.3  HCT 39.3  MCV 83.6  PLT 99991111   Basic Metabolic Panel  Recent Labs  06/02/15 0218 06/03/15 0227 06/04/15 0424  NA 139 139 138  K 3.9 4.2 3.7  CL 106 104 107  CO2 24 26 23   GLUCOSE 102* 99 88  BUN 21* 23* 18  CREATININE 1.10 1.12 1.08  CALCIUM 8.9 9.2 8.7*  MG 2.1  --   --    Liver Function  Tests No results for input(s): AST, ALT, ALKPHOS, BILITOT, PROT, ALBUMIN in the last 72 hours. No results for input(s): LIPASE, AMYLASE in the last 72 hours. Cardiac Enzymes  Recent Labs  06/01/15 1202 06/01/15 1730 06/01/15 2312  TROPONINI 0.18* 0.18* 0.17*   Fasting Lipid Panel  Recent Labs  06/01/15 1202  CHOL 223*  HDL 45  LDLCALC 126*  TRIG 258*  CHOLHDL 5.0   Thyroid Function Tests  Recent Labs  06/01/15 1202  TSH 4.147    TELE  Sinus rhythm with PACs  Radiology/Studies  Dg Chest 2 View  05/31/2015  CLINICAL DATA:  70 year old male with acute chest pain and shortness of breath. EXAM: CHEST  2 VIEW COMPARISON:  06/29/2012 and 02/12/2011 chest radiograph FINDINGS: The cardiomediastinal silhouette is unremarkable. Mild peribronchial thickening is unchanged. There is no evidence of focal airspace disease, pulmonary edema, suspicious pulmonary nodule/mass, pleural effusion, or pneumothorax. No acute bony abnormalities are identified. IMPRESSION: No active cardiopulmonary disease. Electronically Signed   By: Margarette Canada M.D.   On: 05/31/2015 16:15    ASSESSMENT AND PLAN 1. NSTEMI - Peak  of troponin was 0.2. Trending down. For cath today.  - Echo 06/01/2015 normal EF, mild to moderate AS.  - Continue ASA, BB and statin - post DES to circumflex  DAT for a year   2. HLD: uncontrolled, h/o of myalgia with 40mg  pravachol. Started Crestor 5mg  during this admission. 06/01/2015: Cholesterol 223*; HDL 45; LDL Cholesterol 126*; Triglycerides 258*; VLDL 52*  3. Mild to moderate AS on echo 06/01/2015  Ok to d/c home will arrange outpatient f/u with Dr Stanford Breed  Signed, Jenkins Rouge PA-C Pager [phone]

## 2015-06-04 NOTE — Progress Notes (Addendum)
CARDIAC REHAB PHASE I   PRE:  Rate/Rhythm: 78 SR  BP:  Sitting: 163/82        SaO2: 99 RA  MODE:  Ambulation: 750 ft   POST:  Rate/Rhythm: 96 SR c/ PACs  BP:  Sitting: 195/96         SaO2: 99 RA  Pt ambulated 750 ft on RA, independent, steady gait, tolerated well. Pt c/o mild DOE, denies cp, dizziness, brief standing rest x1.  BP elevated this morning. Pt states he did not receive BP meds yesterday.  Completed MI/stent education.  Reviewed risk factors, anti-platelet therapy, stent card, activity restrictions, ntg, exercise, heart healthy diet, portion control, and phase 2 cardiac rehab. Pt verbalized understanding, receptive to education, did ask many questions. Pt states he has been under a lot of stress since his brother died from cardiac arrest as he cared for his mentally ill sister-in-law. Also, of note, pt requesting to follow with Dr. Sallyanne Kuster in the outpatient setting. Pt agrees to phase 2 cardiac rehab referral, will send to [facility] Pt to see case manger regarding brilinta prior to discharge. Pt to recliner after walk, call bell within reach.   [phone]  Lenna Sciara, RN, BSN 06/04/2015 9:27 AM

## 2015-06-04 NOTE — Progress Notes (Signed)
Pt and wife given discharge orders. Questions answered with return understanding from both. Pt and wife understands that prescriptions are at [facility]. Pt is aware of restrictions with rt wrist as well.  VSS. No  Pain. All belongings with pt.

## 2015-06-04 NOTE — Care Management Note (Signed)
Case Management Note  Patient Details  Name: Lawrence Hall MRN: [id number] Date of Birth: Jul 07, 1945  Subjective/Objective:    Patient is from home with spouse, pta indep , no needs.  Patient has 30 day savings card for Brinlinta, his copay is $45 for 30 day supply.  He uses [facility], they have 29 pills in stock and state they have a big order coming in tomorrow.  Patient will pick up the 29 pills today and the rest tomorrow.                  Action/Plan:   Expected Discharge Date:                  Expected Discharge Plan:  Home/Self Care  In-House Referral:     Discharge planning Services  CM Consult  Post Acute Care Choice:    Choice offered to:     DME Arranged:    DME Agency:     HH Arranged:    [place] Agency:     Status of Service:  Completed, signed off  Medicare Important Message Given:    Date Medicare IM Given:    Medicare IM give by:    Date Additional Medicare IM Given:    Additional Medicare Important Message give by:     If discussed at [place] of Stay Meetings, dates discussed:    Additional Comments:  Zenon Mayo, RN 06/04/2015, 9:54 AM

## 2015-06-04 NOTE — Discharge Summary (Addendum)
Discharge Summary  Lawrence Hall [id number] DOB: [birth year]  PCP: Marylene Land, MD  Admit date: 05/31/2015 Discharge date: 06/04/2015  Time spent: <26mins  Recommendations for Outpatient Follow-up:  1. F/u with PMD within a week for hospital discharge follow up, pmd to continue monitor bp and cholesterol control. 2. F/u with cardiology on 2/22  Discharge Diagnoses:  Active Hospital Problems   Diagnosis Date Noted  . Chest pain 07/25/2012  . NSTEMI (non-ST elevated myocardial infarction) ([place])   . Aortic stenosis   . Non-STEMI (non-ST elevated myocardial infarction) ([place])   . Essential hypertension 06/01/2015  . Asthma 05/31/2015  . Elevated troponin 05/31/2015  . Arthritis   . Hyperlipidemia     Resolved Hospital Problems   Diagnosis Date Noted Date Resolved  No resolved problems to display.    Discharge Condition: stable  Diet recommendation: heart healthy  Filed Weights   05/31/15 1514 06/04/15 0452  Weight: 106.595 kg (235 lb) 107.5 kg (236 lb 15.9 oz)    History of present illness:  Lawrence Hall is a 70 y.o. male with a past medical history of prostate adenocarcinoma, asthma, hyperlipidemia, skin cancer, ED cataract, posterior arthritis, cardiac dysrhythmia, GERD, diabetic control hypertension, mild sleep apnea who comes to the emergency department due to having episodes of chest pains for the past 2 weeks.  Per patient, for the past 2 weeks, he has been having midsternal chest pressure on exertion, nonradiating associated with mild dyspnea and occasionally with diaphoresis. He denies dizziness, nausea, pitting edema lower extremities PND or orthopnea. Per patient, the last episode of chest pain that he had was yesterday afternoon while he was doing so in jar work at home and was putting up a bird house when he felt sudden onset of midsternal chest pain, pressure-like, nonradiating associated with the dyspnea and diaphoresis. Patient states that he went today to  his PCP who recommended for him to come to the emergency department. He is not a smoker. He has a history of hyperlipidemia, hypertension, obesity and a family history of sudden cardiac death at age 80 of his fraternal twin.  When seen in the emergency department, the patient was in no acute distress. He denied chest pain. EKG did not have any acute changes, however his troponin was mildly elevated at 0.15.   Hospital Course:  Principal Problem:   Chest pain Active Problems:   Hyperlipidemia   Arthritis   Asthma   Elevated troponin   Essential hypertension   Non-STEMI (non-ST elevated myocardial infarction) (HCC)   NSTEMI (non-ST elevated myocardial infarction) (H[facility])   Aortic stenosis  Chest pain:  associated with exertion and eating, chest pain is pressure like with diaphoresis, radiating to right shoulder. does has positive family history. with troponin elevation, continue trend troponin, vital stable, currently on asa,started statin.  S/p cardiac cath and stenting on 2/13, management per cardiology 2/14 patient is cleared by cardiology to discharge home on meds regimen listed below and outpatient cards follow up.  HLD: statin  HTN; started toprolxl, bp meds need to be continue titrated on outpatient basis.  H/o asthma, stable, no wheezing, no sob  H/o prostate cancer s/p surgery, reported cancer free by patient.  Code Status: full  Family Communication: patient and wife  Disposition Plan: home 2/14   Consultants:  Cardiology Dr Recardo Evangelist  Procedures:  Cardiac cath 2/13  Antibiotics:  none   Discharge Exam: BP 163/82 mmHg  Pulse 87  Temp(Src) 97.5 F (36.4 C) (Oral)  Resp 18  Ht 6' (1.829 m)  Wt 107.5 kg (236 lb 15.9 oz)  BMI 32.14 kg/m2  SpO2 98%   General: NAD  Cardiovascular: RRR  Respiratory: CTABL  Abdomen: Soft/ND/NT, positive BS  Musculoskeletal: No Edema  Neuro: aaox3   Discharge Instructions You were cared for by a hospitalist  during your hospital stay. If you have any questions about your discharge medications or the care you received while you were in the hospital after you are discharged, you can call the unit and asked to speak with the hospitalist on call if the hospitalist that took care of you is not available. Once you are discharged, your primary care physician will handle any further medical issues. Please note that NO REFILLS for any discharge medications will be authorized once you are discharged, as it is imperative that you return to your primary care physician (or establish a relationship with a primary care physician if you do not have one) for your aftercare needs so that they can reassess your need for medications and monitor your lab values.      Discharge Instructions    Amb Referral to Cardiac Rehabilitation    Complete by:  As directed   Diagnosis:   Myocardial Infarction PCI       Diet - low sodium heart healthy    Complete by:  As directed   Low fat diet     Increase activity slowly    Complete by:  As directed             Medication List    STOP taking these medications        aspirin 81 MG tablet  Replaced by:  aspirin 81 MG EC tablet      TAKE these medications        aspirin 81 MG EC tablet  Take 1 tablet (81 mg total) by mouth daily.     metoprolol succinate 25 MG 24 hr tablet  Commonly known as:  TOPROL-XL  Take 1 tablet (25 mg total) by mouth daily.     rosuvastatin 5 MG tablet  Commonly known as:  CRESTOR  Take 1 tablet (5 mg total) by mouth daily.     ticagrelor 90 MG Tabs tablet  Commonly known as:  BRILINTA  Take 1 tablet (90 mg total) by mouth 2 (two) times daily.       Allergies  Allergen Reactions  . Heparin Other (See Comments)    Blood thinner with pheresis Passed out  . Other Other (See Comments)    Pet dander, seasonal allergies   Follow-up Information    Follow up with HAGER, BRYAN, PA-C. Go on 06/12/2015.   Specialties:  Physician Assistant,  Radiology, Interventional Cardiology   Why:  NSTEMI s/p stent @8 :30am   Contact information:   [address] [place] [address] [phone]       Follow up with [facility], MD On 06/24/2015.   Specialty:  Family Medicine   Why:  hospital discharge follow up   Contact information:   [address] [place] [address] [phone]        The results of significant diagnostics from this hospitalization (including imaging, microbiology, ancillary and laboratory) are listed below for reference.    Significant Diagnostic Studies: Dg Chest 2 View  05/31/2015  CLINICAL DATA:  70 year old male with acute chest pain and shortness of breath. EXAM: CHEST  2 VIEW COMPARISON:  06/29/2012 and 02/12/2011 chest radiograph FINDINGS: The cardiomediastinal silhouette  is unremarkable. Mild peribronchial thickening is unchanged. There is no evidence of focal airspace disease, pulmonary edema, suspicious pulmonary nodule/mass, pleural effusion, or pneumothorax. No acute bony abnormalities are identified. IMPRESSION: No active cardiopulmonary disease. Electronically Signed   By: Margarette Canada M.D.   On: 05/31/2015 16:15    Microbiology: No results found for this or any previous visit (from the past 240 hour(s)).   Labs: Basic Metabolic Panel:  Recent Labs Lab 05/31/15 1529 05/31/15 2336 06/01/15 0337 06/02/15 0218 06/03/15 0227 06/04/15 0424  NA 140  --  140 139 139 138  K 3.8  --  4.1 3.9 4.2 3.7  CL 104  --  105 106 104 107  CO2 24  --  25 24 26 23   GLUCOSE 140*  --  95 102* 99 88  BUN 24*  --  24* 21* 23* 18  CREATININE 0.95  --  1.00 1.10 1.12 1.08  CALCIUM 9.3  --  8.7* 8.9 9.2 8.7*  MG  --  2.2  --  2.1  --   --    Liver Function Tests:  Recent Labs Lab 05/31/15 1529  AST 21  ALT 21  ALKPHOS 45  BILITOT 0.7  PROT 7.4  ALBUMIN 3.7   No results for input(s): LIPASE, AMYLASE in the last 168 hours. No results for input(s): AMMONIA in the last 168  hours. CBC:  Recent Labs Lab 05/31/15 1529 06/04/15 0424  WBC 6.1 8.9  HGB 14.2 13.3  HCT 41.3 39.3  MCV 83.4 83.6  PLT 209 212   Cardiac Enzymes:  Recent Labs Lab 05/31/15 2226 06/01/15 0337 06/01/15 1202 06/01/15 1730 06/01/15 2312  TROPONINI 0.18* 0.20* 0.18* 0.18* 0.17*   BNP: BNP (last 3 results) No results for input(s): BNP in the last 8760 hours.  ProBNP (last 3 results) No results for input(s): PROBNP in the last 8760 hours.  CBG: No results for input(s): GLUCAP in the last 168 hours.     SignedFlorencia Reasons MD, PhD  [facility] 06/04/2015, 11:21 AM

## 2015-06-05 ENCOUNTER — Telehealth: Payer: Self-pay | Admitting: Cardiovascular Disease

## 2015-06-05 MED ORDER — NITROGLYCERIN 0.4 MG SL SUBL: 0.4000 mg | SUBLINGUAL_TABLET | SUBLINGUAL | Status: DC | PRN

## 2015-06-05 NOTE — Telephone Encounter (Signed)
Patient contacted regarding discharge from [facility] on 06/04/15.  Patient understands to follow up with provider Tarri Fuller on 06/12/15 at 8:30 am at [facility] . Patient understands discharge instructions? yes  Patient understands medications and regiment? yes  Patient understands to bring all medications to this visit? yes

## 2015-06-05 NOTE — Telephone Encounter (Signed)
Spoke with patient  1) need prescription for NTG, called into pharmacy 2) how long does he has to wait until he use the computer after radial cath  RN e-sent prescription to [facility] May return to using computer on Monday 06/10/15- per Ignacia Bayley PA PATIENT AWARE.

## 2015-06-05 NOTE — Telephone Encounter (Signed)
Pt had procedure on Monday(Cath). He wants to know how long he needs not to use his computer.

## 2015-06-06 DIAGNOSIS — E782 Mixed hyperlipidemia: Secondary | ICD-10-CM | POA: Diagnosis not present

## 2015-06-06 DIAGNOSIS — I214 Non-ST elevation (NSTEMI) myocardial infarction: Secondary | ICD-10-CM | POA: Diagnosis not present

## 2015-06-12 ENCOUNTER — Ambulatory Visit (INDEPENDENT_AMBULATORY_CARE_PROVIDER_SITE_OTHER): Payer: PPO | Admitting: Physician Assistant

## 2015-06-12 ENCOUNTER — Encounter: Payer: Self-pay | Admitting: Physician Assistant

## 2015-06-12 VITALS — BP 128/86 | HR 80 | Ht 72.0 in | Wt 229.0 lb

## 2015-06-12 DIAGNOSIS — E785 Hyperlipidemia, unspecified: Secondary | ICD-10-CM | POA: Diagnosis not present

## 2015-06-12 DIAGNOSIS — I10 Essential (primary) hypertension: Secondary | ICD-10-CM | POA: Diagnosis not present

## 2015-06-12 DIAGNOSIS — I2510 Atherosclerotic heart disease of native coronary artery without angina pectoris: Secondary | ICD-10-CM | POA: Diagnosis not present

## 2015-06-12 DIAGNOSIS — I2583 Coronary atherosclerosis due to lipid rich plaque: Secondary | ICD-10-CM

## 2015-06-12 MED ORDER — ROSUVASTATIN CALCIUM 20 MG PO TABS: 20.0000 mg | ORAL_TABLET | Freq: Every day | ORAL | Status: DC

## 2015-06-12 NOTE — Progress Notes (Signed)
Patient ID: Lawrence Hall, male   DOB: 12/01/1945, 70 y.o.   MRN: [id number]    Date:  06/12/2015   ID:  Lawrence Hall, DOB [birth year], MRN [id number]  PCP:  Marylene Land, MD  Primary Cardiologist: [name]  Chief Complaint  Patient presents with  . Follow-up    no chest pain, no swelling, no cramping, no shortness of breath, no dizziness or lightheadedness     History of Present Illness: Lawrence Hall is a 70 y.o. male with a past medical history of prostate adenocarcinoma, asthma, hyperlipidemia, skin cancer, ED cataract, posterior arthritis, cardiac dysrhythmia, GERD, diabetic control hypertension, mild sleep apnea.  He was admitted from 05/30/1998 08/02/2015 with chest pain.  He underwent left heart catheterization revealing mid LAD lesion, 50% stenosed. Ost Cx lesion, 20% stenosed. Prox RCA lesion, 20% stenosed.  Mid RCA lesion, 70% stenosed.  Prox Cx to Dist Cx lesion, 95% stenosed. He underwent PCI to the circumflex vessel. The left ventricular systolic function is normal.  Started on dual antiplatelets. An echocardiogram 06/01/2015 revealing normal ejection fraction 55-60% with grade 1 diastolic dysfunction. There is mild to moderate stenosis of the aortic valve.  He presents for posthospital follow-up.  He is doing well with no complaints.  He is walking daily and plans to go to phase 2 cardiac rehab.   He currently denies nausea, vomiting, fever, chest pain, shortness of breath, orthopnea, dizziness, PND, cough, congestion, abdominal pain, hematochezia, melena, lower extremity edema, claudication.  Wt Readings from Last 3 Encounters:  06/12/15 229 lb (103.874 kg)  06/04/15 236 lb 15.9 oz (107.5 kg)  03/30/13 233 lb (105.688 kg)     Past Medical History  Diagnosis Date  . Prostate cancer ([place]) 03/27/2011    prostate bx=adenocarcinoma,  . Asthma   . Hypercholesterolemia   . Skin cancer   . ED (erectile dysfunction)   . Cataract     early signs b/l eyes  . Arthritis     mild  hands  . Skin cancer 2008    basal cell face /removed  . Dysrhythmia     PAC'S  . H/O hiatal hernia   . Hypertension     MARGINALLY HIGH - NO MEDS -MONITORS  . Sleep apnea     USES NOSTRIL APARATUS - NO C-PAP- "BORDERLINE SLEEP APMNEA"    Current Outpatient Prescriptions  Medication Sig Dispense Refill  . aspirin EC 81 MG EC tablet Take 1 tablet (81 mg total) by mouth daily. 30 tablet 0  . metoprolol succinate (TOPROL-XL) 25 MG 24 hr tablet Take 1 tablet (25 mg total) by mouth daily. 30 tablet 0  . nitroGLYCERIN (NITROSTAT) 0.4 MG SL tablet Place 1 tablet (0.4 mg total) under the tongue every 5 (five) minutes as needed for chest pain. 25 tablet 6  . rosuvastatin (CRESTOR) 20 MG tablet Take 1 tablet (20 mg total) by mouth daily. 30 tablet 6  . ticagrelor (BRILINTA) 90 MG TABS tablet Take 1 tablet (90 mg total) by mouth 2 (two) times daily. 60 tablet 0   No current facility-administered medications for this visit.    Allergies:    Allergies  Allergen Reactions  . Other Other (See Comments)    Pet dander, seasonal allergies    Social History:  The patient  reports that he has never smoked. He does not have any smokeless tobacco history on file. He reports that he drinks alcohol. He reports that he does not use illicit drugs.   Family history:  Family History  Problem Relation Age of Onset  . Prostate cancer Father 61    in his 70's/other comorbidities/79 deceased  . Breast cancer Mother 54    mets to bone,deceased age 22  . Cervical cancer Sister     2 sisters  . Cervical cancer Sister   . CAD Brother     Died of sudden cardiac death/MI    ROS:  Please see the history of present illness.  All other systems reviewed and negative.   PHYSICAL EXAM: VS:  BP 128/86 mmHg  Pulse 80  Ht 6' (1.829 m)  Wt 229 lb (103.874 kg)  BMI 31.05 kg/m2 Well nourished, well developed, in no acute distress HEENT: Pupils are equal round react to light accommodation extraocular movements  are intact.  Neck: no JVDNo cervical lymphadenopathy. Cardiac: Regular rate and rhythm with 1/6 SYS MM RSB Lungs:  clear to auscultation bilaterally, no wheezing, rhonchi or rales Abd: soft, nontender, positive bowel sounds all quadrants, no hepatosplenomegaly Ext: no lower extremity edema.  2+ radial and dorsalis pedis pulses. Skin: warm and dry Neuro:  Grossly normal  EKG:    SR 80bpm.  PACs  Lateral twi  ASSESSMENT AND PLAN:  Problem List Items Addressed This Visit    Hyperlipidemia - Primary   Relevant Medications   rosuvastatin (CRESTOR) 20 MG tablet   Essential hypertension   Relevant Medications   rosuvastatin (CRESTOR) 20 MG tablet   Coronary artery disease due to lipid rich plaque   Relevant Medications   rosuvastatin (CRESTOR) 20 MG tablet     Mr. Lawrence Hall is doing well.  He has not missed any brilinta.  BP is controlled.  He is willing to try an increase in crestor to 20mg .  He will participate in phase 2 CR.  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  FU in 3 months.

## 2015-06-12 NOTE — Patient Instructions (Addendum)
Your physician has recommended you make the following change in your medication: INCREASE crestor to 20mg  daily -- you can take 4 of your 5mg  tablets until you run out -- a new prescription for 20mg  has been sent to your pharmacy   Your physician recommends that you schedule a follow-up appointment in: 3 months with Dr. Sallyanne Kuster

## 2015-06-14 ENCOUNTER — Telehealth: Payer: Self-pay | Admitting: *Deleted

## 2015-06-14 NOTE — Telephone Encounter (Signed)
Cardiac rehab orders faxed.

## 2015-06-19 DIAGNOSIS — C61 Malignant neoplasm of prostate: Secondary | ICD-10-CM | POA: Diagnosis not present

## 2015-06-26 DIAGNOSIS — C61 Malignant neoplasm of prostate: Secondary | ICD-10-CM | POA: Diagnosis not present

## 2015-06-26 DIAGNOSIS — N393 Stress incontinence (female) (male): Secondary | ICD-10-CM | POA: Diagnosis not present

## 2015-06-26 DIAGNOSIS — N5201 Erectile dysfunction due to arterial insufficiency: Secondary | ICD-10-CM | POA: Diagnosis not present

## 2015-06-26 DIAGNOSIS — Z0000 Encounter for general adult medical examination without abnormal findings: Secondary | ICD-10-CM | POA: Diagnosis not present

## 2015-06-28 ENCOUNTER — Other Ambulatory Visit: Payer: Self-pay | Admitting: Cardiovascular Disease

## 2015-06-28 MED ORDER — METOPROLOL SUCCINATE ER 25 MG PO TB24: 25.0000 mg | ORAL_TABLET | Freq: Every day | ORAL | Status: DC

## 2015-06-28 NOTE — Telephone Encounter (Signed)
Rx(s) sent to pharmacy electronically.  

## 2015-07-02 ENCOUNTER — Encounter (HOSPITAL_COMMUNITY): Payer: Self-pay

## 2015-07-02 ENCOUNTER — Telehealth: Payer: Self-pay | Admitting: Cardiology

## 2015-07-02 ENCOUNTER — Encounter (HOSPITAL_COMMUNITY)
Admission: RE | Admit: 2015-07-02 | Discharge: 2015-07-02 | Disposition: A | Discharge status: Home / Self Care | Payer: PPO | Source: Ambulatory Visit | Attending: Cardiovascular Disease | Admitting: Cardiovascular Disease

## 2015-07-02 ENCOUNTER — Other Ambulatory Visit: Payer: Self-pay | Admitting: *Deleted

## 2015-07-02 VITALS — BP 120/80 | HR 73 | Ht 71.25 in | Wt 228.0 lb

## 2015-07-02 DIAGNOSIS — Z955 Presence of coronary angioplasty implant and graft: Secondary | ICD-10-CM | POA: Insufficient documentation

## 2015-07-02 DIAGNOSIS — I214 Non-ST elevation (NSTEMI) myocardial infarction: Secondary | ICD-10-CM | POA: Insufficient documentation

## 2015-07-02 MED ORDER — TICAGRELOR 90 MG PO TABS: 90.0000 mg | ORAL_TABLET | Freq: Two times a day (BID) | ORAL | Status: DC

## 2015-07-02 NOTE — Progress Notes (Addendum)
Mr Brian tolerated his walk test without difficulty. Vital signs stable. Mr Maclellan was noted to have a short nonsustained run of frequent PVC's and some PAC's and a first degree heart block. Sheral Apley RN notified about PVC's and PAC's at Dr [employer] office.Will fax today's ECG tracings to Dr [name]'s office for review. Patient here today for orientation.

## 2015-07-02 NOTE — Progress Notes (Signed)
Cardiac Rehab Medication Review by a Pharmacist  Does the patient  feel that his/her medications are working for him/her?  yes  Has the patient been experiencing any side effects to the medications prescribed?  no  Does the patient measure his/her own blood pressure or blood glucose at home?  yes   Does the patient have any problems obtaining medications due to transportation or finances?   no  Understanding of regimen: excellent Understanding of indications: excellent Potential of compliance: excellent    Pharmacist comments: Pt reported that he takes [place]. This medication has an interaction with his Brilinta (decreases the level of Brilinta). I informed the patient of this and he said he will stop taking [employer]. The patient is really involved in is care and his trying to improve his health. He has a app on his phone that helps him remember his medication.   Darl Pikes, PharmD Clinical Pharmacist- Resident Pager: [phone]   Darl Pikes 07/02/2015 2:31 PM

## 2015-07-02 NOTE — Telephone Encounter (Signed)
Message routed to [facility] triage pool.  Have no received strips from [place], [place]

## 2015-07-02 NOTE — Telephone Encounter (Signed)
Spoke to [place]...  Patient came for orientation - did walk test - had non-sustained run of PVCs. Asymptomatic. VSS  Stent on 2/13 MI on 2/10  Patient of Dr. Sallyanne Kuster

## 2015-07-03 ENCOUNTER — Encounter: Payer: Self-pay | Admitting: Physician Assistant

## 2015-07-03 NOTE — Progress Notes (Signed)
Cardiac Individual Treatment Plan  Patient Details  Name: Lawrence Hall MRN: [id number] Date of Birth: 27-May-1945 Referring Provider:  Sanda Klein, MD  Initial Encounter Date:       CARDIAC REHAB PHASE II ORIENTATION from 07/02/2015 in [place]   Date  07/02/15      Visit Diagnosis: NSTEMI (non-ST elevated myocardial infarction) [facility])  Stented coronary artery  Patient's Home Medications on Admission:  Current outpatient prescriptions:  .  aspirin EC 81 MG EC tablet, Take 1 tablet (81 mg total) by mouth daily., Disp: 30 tablet, Rfl: 0 .  B Complex-C (SUPER B COMPLEX PO), Take 1 tablet by mouth., Disp: , Rfl:  .  cholecalciferol (VITAMIN D) 1000 units tablet, Take 1,000 Units by mouth daily., Disp: , Rfl:  .  MAGNESIUM-ZINC PO, Take 1 tablet by mouth., Disp: , Rfl:  .  metoprolol succinate (TOPROL-XL) 25 MG 24 hr tablet, Take 1 tablet (25 mg total) by mouth daily., Disp: 30 tablet, Rfl: 11 .  nitroGLYCERIN (NITROSTAT) 0.4 MG SL tablet, Place 1 tablet (0.4 mg total) under the tongue every 5 (five) minutes as needed for chest pain., Disp: 25 tablet, Rfl: 6 .  omega-3 acid ethyl esters (LOVAZA) 1 g capsule, Take 1 g by mouth daily., Disp: , Rfl:  .  rosuvastatin (CRESTOR) 20 MG tablet, Take 1 tablet (20 mg total) by mouth daily., Disp: 30 tablet, Rfl: 6 .  St Johns Wort 450 MG CAPS, Take 400 mg by mouth daily., Disp: , Rfl:  .  ticagrelor (BRILINTA) 90 MG TABS tablet, Take 1 tablet (90 mg total) by mouth 2 (two) times daily., Disp: 60 tablet, Rfl: 2 .  vitamin E 400 UNIT capsule, Take 400 Units by mouth daily., Disp: , Rfl:   Past Medical History: Past Medical History  Diagnosis Date  . Prostate cancer ([place]) 03/27/2011    prostate bx=adenocarcinoma,  . Asthma   . Hypercholesterolemia   . Skin cancer   . ED (erectile dysfunction)   . Cataract     early signs b/l eyes  . Arthritis     mild hands  . Skin cancer 2008    basal cell face /removed   . Dysrhythmia     PAC'S  . H/O hiatal hernia   . Hypertension     MARGINALLY HIGH - NO MEDS -MONITORS  . Sleep apnea     USES NOSTRIL APARATUS - NO C-PAP- "BORDERLINE SLEEP APMNEA"    Tobacco Use: History  Smoking status  . Never Smoker   Smokeless tobacco  . Not on file    Labs: Recent Review Flowsheet Data    Labs for ITP Cardiac and Pulmonary Rehab Latest Ref Rng 06/01/2015   Cholestrol 0 - 200 mg/dL 223(H)   LDLCALC 0 - 99 mg/dL 126(H)   HDL >40 mg/dL 45   Trlycerides <150 mg/dL 258(H)      Capillary Blood Glucose: No results found for: GLUCAP   Exercise Target Goals: Date: 07/02/15  Exercise Program Goal: Individual exercise prescription set with THRR, safety & activity barriers. Participant demonstrates ability to understand and report RPE using BORG scale, to self-measure pulse accurately, and to acknowledge the importance of the exercise prescription.  Exercise Prescription Goal: Starting with aerobic activity 30 plus minutes a day, 3 days per week for initial exercise prescription. Provide home exercise prescription and guidelines that participant acknowledges understanding prior to discharge.  Activity Barriers & Risk Stratification:     Activity Barriers &  Cardiac Risk Stratification - 07/02/15 1451    Activity Barriers & Cardiac Risk Stratification   Cardiac Risk Stratification High      6 Minute Walk:     6 Minute Walk      07/02/15 1626       6 Minute Walk   Phase Initial     Distance 2049 feet     Walk Time 6 minutes     # of Rest Breaks 0     MPH 3.88     METS 4.11     RPE 13     VO2 Peak 14.38     Symptoms No     Resting HR 73 bpm     Resting BP 120/80 mmHg     Max Ex. HR 107 bpm     Max Ex. BP 142/74 mmHg        Initial Exercise Prescription:     Initial Exercise Prescription - 07/02/15 1600    Date of Initial Exercise Prescription   Date 07/02/15   Treadmill   MPH 3   Grade 2   Minutes 10   METs 4.1   Bike    Level 1.7   Minutes 10   METs 4   NuStep   Level 3   Minutes 10   METs 2   Prescription Details   Frequency (times per week) 3   Duration Progress to 30 minutes of continuous aerobic without signs/symptoms of physical distress   Intensity   THRR 40-80% of Max Heartrate 60-119   Ratings of Perceived Exertion 11-13   Progression   Progression Continue to progress workloads to maintain intensity without signs/symptoms of physical distress.   Resistance Training   Training Prescription Yes   Weight 2 lbs   Reps 10-12      Perform Capillary Blood Glucose checks as needed.  Exercise Prescription Changes:   Discharge Exercise Prescription (Final Exercise Prescription Changes):   Nutrition:  Target Goals: Understanding of nutrition guidelines, daily intake of sodium 1500mg , cholesterol 200mg , calories 30% from fat and 7% or less from saturated fats, daily to have 5 or more servings of fruits and vegetables.  Biometrics:     Pre Biometrics - 07/02/15 1630    Pre Biometrics   Waist Circumference 43 inches   Hip Circumference 41.25 inches   Waist to Hip Ratio 1.04 %   Triceps Skinfold 18 mm   % Body Fat 30.9 %   Grip Strength 35 kg   Flexibility 7 in   Single Leg Stand 19.74 seconds       Nutrition Therapy Plan and Nutrition Goals:     Nutrition Therapy & Goals - 07/03/15 1513    Nutrition Therapy   Diet Therapeutic Lifestyle Changes   Personal Nutrition Goals   Personal Goal #1 6-24 lb (2.7-10.9 kg) wt loss at graduation from [place] Goal #2 Long term weight loss goal weight 189 lb   Intervention Plan   Intervention Prescribe, educate and counsel regarding individualized specific dietary modifications aiming towards targeted core components such as weight, hypertension, lipid management, diabetes, heart failure and other comorbidities.   Expected Outcomes Short Term Goal: Understand basic principles of dietary content, such as calories, fat,  sodium, cholesterol and nutrients.;Long Term Goal: Adherence to prescribed nutrition plan.      Nutrition Discharge: Rate Your Plate Scores:   Nutrition Goals Re-Evaluation:   Psychosocial: Target Goals: Acknowledge presence or absence of depression, maximize coping skills, provide positive  support system. Participant is able to verbalize types and ability to use techniques and skills needed for reducing stress and depression.  Initial Review & Psychosocial Screening:   Quality of Life Scores:     Quality of Life - 07/03/15 1259    Quality of Life Scores   Health/Function Pre 21.43 %   Socioeconomic Pre 18.44 %   Psych/Spiritual Pre 22.71 %   Family Pre 20.1 %   GLOBAL Pre 20.81 %      PHQ-9:     Recent Review Flowsheet Data    There is no flowsheet data to display.      Psychosocial Evaluation and Intervention:   Psychosocial Re-Evaluation:   Vocational Rehabilitation: Provide vocational rehab assistance to qualifying candidates.   Vocational Rehab Evaluation & Intervention:   Education: Education Goals: Education classes will be provided on a weekly basis, covering required topics. Participant will state understanding/return demonstration of topics presented.  Learning Barriers/Preferences:     Learning Barriers/Preferences - 07/02/15 1450    Learning Barriers/Preferences   Learning Barriers None   Learning Preferences Audio;Computer/Internet;Group Instruction;Skilled Demonstration;Verbal Instruction;Written Material      Education Topics: Count Your Pulse:  -Group instruction provided by verbal instruction, demonstration, patient participation and written materials to support subject.  Instructors address importance of being able to find your pulse and how to count your pulse when at home without a heart monitor.  Patients get hands on experience counting their pulse with staff help and individually.   Heart Attack, Angina, and Risk Factor  Modification:  -Group instruction provided by verbal instruction, video, and written materials to support subject.  Instructors address signs and symptoms of angina and heart attacks.    Also discuss risk factors for heart disease and how to make changes to improve heart health risk factors.   Functional Fitness:  -Group instruction provided by verbal instruction, demonstration, patient participation, and written materials to support subject.  Instructors address safety measures for doing things around the house.  Discuss how to get up and down off the floor, how to pick things up properly, how to safely get out of a chair without assistance, and balance training.   Meditation and Mindfulness:  -Group instruction provided by verbal instruction, patient participation, and written materials to support subject.  Instructor addresses importance of mindfulness and meditation practice to help reduce stress and improve awareness.  Instructor also leads participants through a meditation exercise.    Stretching for Flexibility and Mobility:  -Group instruction provided by verbal instruction, patient participation, and written materials to support subject.  Instructors lead participants through series of stretches that are designed to increase flexibility thus improving mobility.  These stretches are additional exercise for major muscle groups that are typically performed during regular warm up and cool down.   Hands Only CPR Anytime:  -Group instruction provided by verbal instruction, video, patient participation and written materials to support subject.  Instructors co-teach with AHA video for hands only CPR.  Participants get hands on experience with mannequins.   Nutrition I class: Heart Healthy Eating:  -Group instruction provided by PowerPoint slides, verbal discussion, and written materials to support subject matter. The instructor gives an explanation and review of the Therapeutic Lifestyle  Changes diet recommendations, which includes a discussion on lipid goals, dietary fat, sodium, fiber, plant stanol/sterol esters, sugar, and the components of a well-balanced, healthy diet.   Nutrition II class: Lifestyle Skills:  -Group instruction provided by PowerPoint slides, verbal discussion, and written materials to  support subject matter. The instructor gives an explanation and review of label reading, grocery shopping for heart health, heart healthy recipe modifications, and ways to make healthier choices when eating out.   Diabetes Question & Answer:  -Group instruction provided by PowerPoint slides, verbal discussion, and written materials to support subject matter. The instructor gives an explanation and review of diabetes co-morbidities, pre- and post-prandial blood glucose goals, pre-exercise blood glucose goals, signs, symptoms, and treatment of hypoglycemia and hyperglycemia, and foot care basics.   Diabetes Blitz:  -Group instruction provided by PowerPoint slides, verbal discussion, and written materials to support subject matter. The instructor gives an explanation and review of the physiology behind type 1 and type 2 diabetes, diabetes medications and rational behind using different medications, pre- and post-prandial blood glucose recommendations and Hemoglobin A1c goals, diabetes diet, and exercise including blood glucose guidelines for exercising safely.    Portion Distortion:  -Group instruction provided by PowerPoint slides, verbal discussion, written materials, and food models to support subject matter. The instructor gives an explanation of serving size versus portion size, changes in portions sizes over the last 20 years, and what consists of a serving from each food group.   Stress Management:  -Group instruction provided by verbal instruction, video, and written materials to support subject matter.  Instructors review role of stress in heart disease and how to cope  with stress positively.     Exercising on Your Own:  -Group instruction provided by verbal instruction, power point, and written materials to support subject.  Instructors discuss benefits of exercise, components of exercise, frequency and intensity of exercise, and end points for exercise.  Also discuss use of nitroglycerin and activating EMS.  Review options of places to exercise outside of rehab.  Review guidelines for sex with heart disease.   Cardiac Drugs I:  -Group instruction provided by verbal instruction and written materials to support subject.  Instructor reviews cardiac drug classes: antiplatelets, anticoagulants, beta blockers, and statins.  Instructor discusses reasons, side effects, and lifestyle considerations for each drug class.   Cardiac Drugs II:  -Group instruction provided by verbal instruction and written materials to support subject.  Instructor reviews cardiac drug classes: angiotensin converting enzyme inhibitors (ACE-I), angiotensin II receptor blockers (ARBs), nitrates, and calcium channel blockers.  Instructor discusses reasons, side effects, and lifestyle considerations for each drug class.   Anatomy and Physiology of the Circulatory System:  -Group instruction provided by verbal instruction, video, and written materials to support subject.  Reviews functional anatomy of heart, how it relates to various diagnoses, and what role the heart plays in the overall system.   Knowledge Questionnaire Score:     Knowledge Questionnaire Score - 07/03/15 1259    Knowledge Questionnaire Score   Pre Score 20/24      Personal Goals and Risk Factors at Admission:     Personal Goals and Risk Factors at Admission - 07/02/15 1404    Core Components/Risk Factors/Patient Goals on Admission    Weight Management Yes;Obesity   Intervention Weight Management: Develop a combined nutrition and exercise program designed to reach desired caloric intake, while maintaining  appropriate intake of nutrient and fiber, sodium and fats, and appropriate energy expenditure required for the weight goal.;Weight Management: Provide education and appropriate resources to help participant work on and attain dietary goals.;Obesity: Provide education and appropriate resources to help participant work on and attain dietary goals.;Weight Management/Obesity: Establish reasonable short term and long term weight goals.   Admit Weight 227 lb  15.3 oz (103.4 kg)   Goal Weight: Short Term 220 lb (99.791 kg)   Goal Weight: Long Term 200 lb (90.719 kg)   Expected Outcomes Short Term: Continue to assess and modify interventions until short term weight is achieved.;Long Term: Adherence to nutrition and physical activity/exercise program aimed toward attainment of established weight goal.   Hypertension Yes   Intervention Provide education on lifestyle modifcations including regular physical activity/exercise, weight management, moderate sodium restriction and increased consumption of fresh fruit, vegetables, and low fat dairy, alcohol moderation, and smoking cessation.;Monitor prescription use compliance.   Expected Outcomes Short Term: Continued assessment and intervention until BP is < 140/64mm HG in hypertensive participants. < 130/53mm HG in hypertensive participants with diabetes, heart failure or chronic kidney disease.;Long Term: Maintenance of blood pressure at goal levels.   Lipids Yes   Intervention Provide education and support for participant on nutrition & aerobic/resistive exercise along with prescribed medications to achieve LDL 70mg , HDL >40mg .   Expected Outcomes Short Term: Participant states understanding of desired cholesterol values and is compliant with medications prescribed. Participant is following exercise prescription and nutrition guidelines.;Long Term: Cholesterol controlled with medications as prescribed, with individualized exercise RX and with personalized nutrition  plan. Value goals: LDL < 70mg , HDL > 40 mg.   Stress Yes   Intervention Offer individual and/or small group education and counseling on adjustment to heart disease, stress management and health-related lifestyle change. Teach and support self-help strategies.;Refer participants experiencing significant psychosocial distress to appropriate mental health specialists for further evaluation and treatment. When possible, include family members and significant others in education/counseling sessions.   Expected Outcomes Short Term: Participant demonstrates changes in health-related behavior, relaxation and other stress management skills, ability to obtain effective social support, and compliance with psychotropic medications if prescribed.;Long Term: Emotional wellbeing is indicated by absence of clinically significant psychosocial distress or social isolation.   Personal Goal Other Yes   Personal Goal Back to jogging and riding bike and gym   Intervention Provide exercise prescription for in program and at home   Expected Outcomes Able to return to activities      Personal Goals and Risk Factors Review:    Personal Goals Discharge (Final Personal Goals and Risk Factors Review):    ITP Comments:     ITP Comments      07/03/15 1309           ITP Comments Medical Director-Dr. Fransico Him, MD          Comments.Patient attended orientation from 1330 to 1600 to review rules and guidelines for program. Completed 6 minute walk test, Intitial ITP, and exercise prescription.  VSS. Telemetry-Sinus with PVC's.  Asymptomatic.

## 2015-07-04 ENCOUNTER — Telehealth: Payer: Self-pay | Admitting: *Deleted

## 2015-07-04 NOTE — Telephone Encounter (Signed)
Pt is needing clearance for dental surgery and directions regarding his brilinta Will forward for dr croitoru's review

## 2015-07-04 NOTE — Telephone Encounter (Signed)
He received a drug eluting stent on 06/03/15 and needs to stay on Brilinta until Valentine's Day 2018. It cannot be stopped even temporarily unless there is a critical bleeding emergency for 6 months. He can have dental procedures if they do not involve stopping the Brilinta.

## 2015-07-04 NOTE — Telephone Encounter (Signed)
Will forward this note to the number provided. 

## 2015-07-08 ENCOUNTER — Encounter (HOSPITAL_COMMUNITY)
Admission: RE | Admit: 2015-07-08 | Discharge: 2015-07-08 | Disposition: A | Discharge status: Home / Self Care | Payer: PPO | Source: Ambulatory Visit | Attending: Cardiology | Admitting: Cardiology

## 2015-07-08 ENCOUNTER — Encounter (HOSPITAL_COMMUNITY): Payer: Self-pay

## 2015-07-08 DIAGNOSIS — Z955 Presence of coronary angioplasty implant and graft: Secondary | ICD-10-CM

## 2015-07-08 DIAGNOSIS — I214 Non-ST elevation (NSTEMI) myocardial infarction: Secondary | ICD-10-CM | POA: Diagnosis not present

## 2015-07-08 NOTE — Progress Notes (Signed)
Daily Session Note  Patient Details  Name: Lawrence Hall MRN: [id number] Date of Birth: [birth year] Referring Provider:  Derinda Late, MD  Encounter Date: 07/08/2015  Check In:     Session Check In - 07/08/15 1338    Check-In   Location [facility]   Staff Present Maurice Small, RN, BSN;Amber Fair, MS, ACSM RCEP, Exercise Physiologist;Jessica [place], MA, ACSM RCEP, Exercise Physiologist;Alexi Geibel, RN, BSN   Supervising physician immediately available to respond to emergencies [facility] immediately available   Physician(s) Dr. Marily Memos   Medication changes reported     No   Fall or balance concerns reported    No   Warm-up and Cool-down Performed as group-led instruction   Resistance Training Performed Yes   VAD Patient? No   Pain Assessment   Currently in Pain? No/denies   Multiple Pain Sites No      Capillary Blood Glucose: No results found for this or any previous visit (from the past 24 hour(s)).   Goals Met:  Exercise tolerated well  Goals Unmet:  Not Applicable  Comments: Pt started cardiac rehab today.  Pt tolerated light exercise without difficulty. VSS, telemetry-sinus rhythm, asymptomatic.  Medication list reconciled. Pt denies barriers to medicaiton compliance.  PSYCHOSOCIAL ASSESSMENT:  PHQ-0. Pt exhibits positive coping skills, hopeful outlook with supportive family. Pt does express concern about his wife's worry for his health.  Pt offered reassurance and emotional support.  Pt encouraged that regular participation in cardiac rehab will help to alleviate stress and worry for himself and his spouse. Pt offered invitation to include his spouse in cardiac rehab education offerings and she is welcome to visit exercise classes for observation if pt agrees.  Pt verbalized understanding. No psychosocial needs identified at this time, no psychosocial interventions necessary.    Pt enjoys exercise, writing and painting.  Pt is looking forward to  using rower at cardiac rehab. Will plan to include this in pt exercise plan.   Pt oriented to exercise equipment and routine.    Understanding verbalized.   Dr. Fransico Him is Medical Director for Cardiac Rehab at [facility].

## 2015-07-10 ENCOUNTER — Encounter (HOSPITAL_COMMUNITY)
Admission: RE | Admit: 2015-07-10 | Discharge: 2015-07-10 | Disposition: A | Discharge status: Home / Self Care | Payer: PPO | Source: Ambulatory Visit | Attending: Cardiology | Admitting: Cardiology

## 2015-07-10 ENCOUNTER — Telehealth: Payer: Self-pay

## 2015-07-10 DIAGNOSIS — I214 Non-ST elevation (NSTEMI) myocardial infarction: Secondary | ICD-10-CM | POA: Diagnosis not present

## 2015-07-10 NOTE — Telephone Encounter (Signed)
Faxed to Cardiac Rehab ok to resume exercise.

## 2015-07-12 ENCOUNTER — Encounter (HOSPITAL_COMMUNITY)
Admission: RE | Admit: 2015-07-12 | Discharge: 2015-07-12 | Disposition: A | Discharge status: Home / Self Care | Payer: PPO | Source: Ambulatory Visit | Attending: Cardiology | Admitting: Cardiology

## 2015-07-12 DIAGNOSIS — I214 Non-ST elevation (NSTEMI) myocardial infarction: Secondary | ICD-10-CM

## 2015-07-12 DIAGNOSIS — Z955 Presence of coronary angioplasty implant and graft: Secondary | ICD-10-CM

## 2015-07-15 ENCOUNTER — Encounter (HOSPITAL_COMMUNITY)
Admission: RE | Admit: 2015-07-15 | Discharge: 2015-07-15 | Disposition: A | Discharge status: Home / Self Care | Payer: PPO | Source: Ambulatory Visit | Attending: Cardiology | Admitting: Cardiology

## 2015-07-15 ENCOUNTER — Encounter: Payer: Self-pay | Admitting: Physician Assistant

## 2015-07-15 DIAGNOSIS — I214 Non-ST elevation (NSTEMI) myocardial infarction: Secondary | ICD-10-CM

## 2015-07-15 DIAGNOSIS — Z955 Presence of coronary angioplasty implant and graft: Secondary | ICD-10-CM

## 2015-07-15 NOTE — Progress Notes (Signed)
Daily Session Note  Patient Details  Name: [name] MRN: [id number] Date of Birth: [birth year] Referring Provider:  Derinda Late, MD  Encounter Date: 07/12/2015  Check In:   Capillary Blood Glucose: No results found for this or any previous visit (from the past 24 hour(s)).   Goals Met:  Independence with exercise equipment Exercise tolerated well  Goals Unmet:  Not Applicable  Comments: QUALITY OF LIFE SCORE REVIEW  Pt completed Quality of Life survey as a participant in Cardiac Rehab. Scores 21.0 or below are considered low. Pt score very low in several areas Overall 20, Health and Function 21, socioeconomic 18, physiological and spiritual 22, family 51. Patient quality of life slightly altered by physical constraints which limits ability to perform as prior to recent cardiac illness.  In addition, pt is concerned about the stress and worry his cardiac event has caused his wife. Pt given information about [facility] consultation to discuss coping with chronic illness and recent cardiac event.  Offered emotional support and reassurance.  Will continue to monitor and intervene as necessary.       Dr. Fransico Him is Medical Director for Cardiac Rehab at [facility].

## 2015-07-16 NOTE — Progress Notes (Signed)
Cardiac Individual Treatment Plan  Patient Details  Name: Lawrence Hall MRN: [id number] Date of Birth: 17-Jul-1945 Referring Provider:  Derinda Late, MD  Initial Encounter Date:       CARDIAC REHAB PHASE II ORIENTATION from 07/02/2015 in [place]   Date  07/02/15      Visit Diagnosis: NSTEMI (non-ST elevated myocardial infarction) [facility])  Stented coronary artery  Patient's Home Medications on Admission:  Current outpatient prescriptions:  .  aspirin EC 81 MG EC tablet, Take 1 tablet (81 mg total) by mouth daily., Disp: 30 tablet, Rfl: 0 .  B Complex-C (SUPER B COMPLEX PO), Take 1 tablet by mouth., Disp: , Rfl:  .  cholecalciferol (VITAMIN D) 1000 units tablet, Take 1,000 Units by mouth daily., Disp: , Rfl:  .  Coenzyme Q10 (COQ10) 200 MG CAPS, Take 200 mg by mouth daily., Disp: , Rfl:  .  MAGNESIUM-ZINC PO, Take 1 tablet by mouth., Disp: , Rfl:  .  metoprolol succinate (TOPROL-XL) 25 MG 24 hr tablet, Take 1 tablet (25 mg total) by mouth daily., Disp: 30 tablet, Rfl: 11 .  nitroGLYCERIN (NITROSTAT) 0.4 MG SL tablet, Place 1 tablet (0.4 mg total) under the tongue every 5 (five) minutes as needed for chest pain., Disp: 25 tablet, Rfl: 6 .  omega-3 acid ethyl esters (LOVAZA) 1 g capsule, Take 1 g by mouth daily., Disp: , Rfl:  .  rosuvastatin (CRESTOR) 20 MG tablet, Take 1 tablet (20 mg total) by mouth daily., Disp: 30 tablet, Rfl: 6 .  ticagrelor (BRILINTA) 90 MG TABS tablet, Take 1 tablet (90 mg total) by mouth 2 (two) times daily., Disp: 60 tablet, Rfl: 2 .  vitamin C (ASCORBIC ACID) 500 MG tablet, Take 500 mg by mouth daily., Disp: , Rfl:  .  vitamin E 400 UNIT capsule, Take 400 Units by mouth daily., Disp: , Rfl:   Past Medical History: Past Medical History  Diagnosis Date  . Prostate cancer ([place]) 03/27/2011    prostate bx=adenocarcinoma,  . Asthma   . Hypercholesterolemia   . Skin cancer   . ED (erectile dysfunction)   . Cataract     early  signs b/l eyes  . Arthritis     mild hands  . Skin cancer 2008    basal cell face /removed  . Dysrhythmia     PAC'S  . H/O hiatal hernia   . Hypertension     MARGINALLY HIGH - NO MEDS -MONITORS  . Sleep apnea     USES NOSTRIL APARATUS - NO C-PAP- "BORDERLINE SLEEP APMNEA"    Tobacco Use: History  Smoking status  . Never Smoker   Smokeless tobacco  . Not on file    Labs:     Recent Review Flowsheet Data    Labs for ITP Cardiac and Pulmonary Rehab Latest Ref Rng 06/01/2015   Cholestrol 0 - 200 mg/dL 223(H)   LDLCALC 0 - 99 mg/dL 126(H)   HDL >40 mg/dL 45   Trlycerides <150 mg/dL 258(H)      Capillary Blood Glucose: No results found for: GLUCAP   Exercise Target Goals:    Exercise Program Goal: Individual exercise prescription set with THRR, safety & activity barriers. Participant demonstrates ability to understand and report RPE using BORG scale, to self-measure pulse accurately, and to acknowledge the importance of the exercise prescription.  Exercise Prescription Goal: Starting with aerobic activity 30 plus minutes a day, 3 days per week for initial exercise prescription. Provide home  exercise prescription and guidelines that participant acknowledges understanding prior to discharge.  Activity Barriers & Risk Stratification:     Activity Barriers & Cardiac Risk Stratification - 07/02/15 1451    Activity Barriers & Cardiac Risk Stratification   Cardiac Risk Stratification High      6 Minute Walk:     6 Minute Walk      07/02/15 1626       6 Minute Walk   Phase Initial     Distance 2049 feet     Walk Time 6 minutes     # of Rest Breaks 0     MPH 3.88     METS 4.11     RPE 13     VO2 Peak 14.38     Symptoms No     Resting HR 73 bpm     Resting BP 120/80 mmHg     Max Ex. HR 107 bpm     Max Ex. BP 142/74 mmHg        Initial Exercise Prescription:     Initial Exercise Prescription - 07/02/15 1600    Date of Initial Exercise Prescription    Date 07/02/15   Treadmill   MPH 3   Grade 2   Minutes 10   METs 4.1   Bike   Level 1.7   Minutes 10   METs 4   NuStep   Level 3   Minutes 10   METs 2   Prescription Details   Frequency (times per week) 3   Duration Progress to 30 minutes of continuous aerobic without signs/symptoms of physical distress   Intensity   THRR 40-80% of Max Heartrate 60-119   Ratings of Perceived Exertion 11-13   Progression   Progression Continue to progress workloads to maintain intensity without signs/symptoms of physical distress.   Resistance Training   Training Prescription Yes   Weight 2 lbs   Reps 10-12      Perform Capillary Blood Glucose checks as needed.  Exercise Prescription Changes:      Exercise Prescription Changes      07/12/15 1000           Exercise Review   Progression Yes       Resistance Training   Training Prescription Yes       Weight 3lbs       Reps 10-12       Treadmill   MPH 3       Grade 2       Minutes 10       METs 4.1       Bike   Level 1.7       Minutes 10       METs 4.1       NuStep   Level 3       Minutes 10       METs 2.3          Exercise Comments:   Discharge Exercise Prescription (Final Exercise Prescription Changes):     Exercise Prescription Changes - 07/12/15 1000    Exercise Review   Progression Yes   Resistance Training   Training Prescription Yes   Weight 3lbs   Reps 10-12   Treadmill   MPH 3   Grade 2   Minutes 10   METs 4.1   Bike   Level 1.7   Minutes 10   METs 4.1   NuStep   Level 3   Minutes 10   METs  2.3      Nutrition:  Target Goals: Understanding of nutrition guidelines, daily intake of sodium 1500mg , cholesterol 200mg , calories 30% from fat and 7% or less from saturated fats, daily to have 5 or more servings of fruits and vegetables.  Biometrics:     Pre Biometrics - 07/02/15 1630    Pre Biometrics   Waist Circumference 43 inches   Hip Circumference 41.25 inches   Waist to Hip Ratio  1.04 %   Triceps Skinfold 18 mm   % Body Fat 30.9 %   Grip Strength 35 kg   Flexibility 7 in   Single Leg Stand 19.74 seconds       Nutrition Therapy Plan and Nutrition Goals:     Nutrition Therapy & Goals - 07/03/15 1513    Nutrition Therapy   Diet Therapeutic Lifestyle Changes   Personal Nutrition Goals   Personal Goal #1 6-24 lb (2.7-10.9 kg) wt loss at graduation from [place] Goal #2 Long term weight loss goal weight 189 lb   Intervention Plan   Intervention Prescribe, educate and counsel regarding individualized specific dietary modifications aiming towards targeted core components such as weight, hypertension, lipid management, diabetes, heart failure and other comorbidities.   Expected Outcomes Short Term Goal: Understand basic principles of dietary content, such as calories, fat, sodium, cholesterol and nutrients.;Long Term Goal: Adherence to prescribed nutrition plan.      Nutrition Discharge: Nutrition Scores:   Nutrition Goals Re-Evaluation:   Psychosocial: Target Goals: Acknowledge presence or absence of depression, maximize coping skills, provide positive support system. Participant is able to verbalize types and ability to use techniques and skills needed for reducing stress and depression.  Initial Review & Psychosocial Screening:     Initial Psych Review & Screening - 07/08/15 1510    Family Dynamics   Good Support System? Yes   Comments significant stress from concern about his wife's worry for his health   Barriers   Psychosocial barriers to participate in program There are no identifiable barriers or psychosocial needs.   Screening Interventions   Interventions Encouraged to exercise      Quality of Life Scores:     Quality of Life - 07/03/15 1259    Quality of Life Scores   Health/Function Pre 21.43 %   Socioeconomic Pre 18.44 %   Psych/Spiritual Pre 22.71 %   Family Pre 20.1 %   GLOBAL Pre 20.81 %      PHQ-9:      Recent Review Flowsheet Data    Depression screen [facility] 2/9 07/08/2015   Decreased Interest 0   Down, Depressed, Hopeless 0   PHQ - 2 Score 0      Psychosocial Evaluation and Intervention:   Psychosocial Re-Evaluation:     Psychosocial Re-Evaluation      07/16/15 1651           Psychosocial Re-Evaluation   Interventions Stress management education;Encouraged to attend Cardiac Rehabilitation for the exercise;Relaxation education       Continued Psychosocial Services Needed Yes          Vocational Rehabilitation: Provide vocational rehab assistance to qualifying candidates.   Vocational Rehab Evaluation & Intervention:   Education: Education Goals: Education classes will be provided on a weekly basis, covering required topics. Participant will state understanding/return demonstration of topics presented.  Learning Barriers/Preferences:     Learning Barriers/Preferences - 07/02/15 1450    Learning Barriers/Preferences   Learning Barriers None   Learning Preferences Audio;Computer/Internet;Group  Instruction;Skilled Demonstration;Verbal Instruction;Written Material      Education Topics: Count Your Pulse:  -Group instruction provided by verbal instruction, demonstration, patient participation and written materials to support subject.  Instructors address importance of being able to find your pulse and how to count your pulse when at home without a heart monitor.  Patients get hands on experience counting their pulse with staff help and individually.   Heart Attack, Angina, and Risk Factor Modification:  -Group instruction provided by verbal instruction, video, and written materials to support subject.  Instructors address signs and symptoms of angina and heart attacks.    Also discuss risk factors for heart disease and how to make changes to improve heart health risk factors.   Functional Fitness:  -Group instruction provided by verbal instruction, demonstration,  patient participation, and written materials to support subject.  Instructors address safety measures for doing things around the house.  Discuss how to get up and down off the floor, how to pick things up properly, how to safely get out of a chair without assistance, and balance training.   Meditation and Mindfulness:  -Group instruction provided by verbal instruction, patient participation, and written materials to support subject.  Instructor addresses importance of mindfulness and meditation practice to help reduce stress and improve awareness.  Instructor also leads participants through a meditation exercise.    Stretching for Flexibility and Mobility:  -Group instruction provided by verbal instruction, patient participation, and written materials to support subject.  Instructors lead participants through series of stretches that are designed to increase flexibility thus improving mobility.  These stretches are additional exercise for major muscle groups that are typically performed during regular warm up and cool down.   Hands Only CPR Anytime:  -Group instruction provided by verbal instruction, video, patient participation and written materials to support subject.  Instructors co-teach with AHA video for hands only CPR.  Participants get hands on experience with mannequins.   Nutrition I class: Heart Healthy Eating:  -Group instruction provided by PowerPoint slides, verbal discussion, and written materials to support subject matter. The instructor gives an explanation and review of the Therapeutic Lifestyle Changes diet recommendations, which includes a discussion on lipid goals, dietary fat, sodium, fiber, plant stanol/sterol esters, sugar, and the components of a well-balanced, healthy diet.   Nutrition II class: Lifestyle Skills:  -Group instruction provided by PowerPoint slides, verbal discussion, and written materials to support subject matter. The instructor gives an explanation and  review of label reading, grocery shopping for heart health, heart healthy recipe modifications, and ways to make healthier choices when eating out.   Diabetes Question & Answer:  -Group instruction provided by PowerPoint slides, verbal discussion, and written materials to support subject matter. The instructor gives an explanation and review of diabetes co-morbidities, pre- and post-prandial blood glucose goals, pre-exercise blood glucose goals, signs, symptoms, and treatment of hypoglycemia and hyperglycemia, and foot care basics.   Diabetes Blitz:  -Group instruction provided by PowerPoint slides, verbal discussion, and written materials to support subject matter. The instructor gives an explanation and review of the physiology behind type 1 and type 2 diabetes, diabetes medications and rational behind using different medications, pre- and post-prandial blood glucose recommendations and Hemoglobin A1c goals, diabetes diet, and exercise including blood glucose guidelines for exercising safely.    Portion Distortion:  -Group instruction provided by PowerPoint slides, verbal discussion, written materials, and food models to support subject matter. The instructor gives an explanation of serving size versus portion size, changes in  portions sizes over the last 20 years, and what consists of a serving from each food group.   Stress Management:  -Group instruction provided by verbal instruction, video, and written materials to support subject matter.  Instructors review role of stress in heart disease and how to cope with stress positively.     Exercising on Your Own:  -Group instruction provided by verbal instruction, power point, and written materials to support subject.  Instructors discuss benefits of exercise, components of exercise, frequency and intensity of exercise, and end points for exercise.  Also discuss use of nitroglycerin and activating EMS.  Review options of places to exercise  outside of rehab.  Review guidelines for sex with heart disease.   Cardiac Drugs I:  -Group instruction provided by verbal instruction and written materials to support subject.  Instructor reviews cardiac drug classes: antiplatelets, anticoagulants, beta blockers, and statins.  Instructor discusses reasons, side effects, and lifestyle considerations for each drug class.   Cardiac Drugs II:  -Group instruction provided by verbal instruction and written materials to support subject.  Instructor reviews cardiac drug classes: angiotensin converting enzyme inhibitors (ACE-I), angiotensin II receptor blockers (ARBs), nitrates, and calcium channel blockers.  Instructor discusses reasons, side effects, and lifestyle considerations for each drug class.          CARDIAC REHAB PHASE II EXERCISE from 07/10/2015 in [place]   Date  07/10/15   Instruction Review Code  2- meets goals/outcomes      Anatomy and Physiology of the Circulatory System:  -Group instruction provided by verbal instruction, video, and written materials to support subject.  Reviews functional anatomy of heart, how it relates to various diagnoses, and what role the heart plays in the overall system.   Knowledge Questionnaire Score:     Knowledge Questionnaire Score - 07/03/15 1259    Knowledge Questionnaire Score   Pre Score 20/24      Core Components/Risk Factors/Patient Goals at Admission:     Personal Goals and Risk Factors at Admission - 07/02/15 1404    Core Components/Risk Factors/Patient Goals on Admission    Weight Management Yes;Obesity   Intervention Weight Management: Develop a combined nutrition and exercise program designed to reach desired caloric intake, while maintaining appropriate intake of nutrient and fiber, sodium and fats, and appropriate energy expenditure required for the weight goal.;Weight Management: Provide education and appropriate resources to help participant  work on and attain dietary goals.;Obesity: Provide education and appropriate resources to help participant work on and attain dietary goals.;Weight Management/Obesity: Establish reasonable short term and long term weight goals.   Admit Weight 227 lb 15.3 oz (103.4 kg)   Goal Weight: Short Term 220 lb (99.791 kg)   Goal Weight: Long Term 200 lb (90.719 kg)   Expected Outcomes Short Term: Continue to assess and modify interventions until short term weight is achieved.;Long Term: Adherence to nutrition and physical activity/exercise program aimed toward attainment of established weight goal.   Hypertension Yes   Intervention Provide education on lifestyle modifcations including regular physical activity/exercise, weight management, moderate sodium restriction and increased consumption of fresh fruit, vegetables, and low fat dairy, alcohol moderation, and smoking cessation.;Monitor prescription use compliance.   Expected Outcomes Short Term: Continued assessment and intervention until BP is < 140/37mm HG in hypertensive participants. < 130/9mm HG in hypertensive participants with diabetes, heart failure or chronic kidney disease.;Long Term: Maintenance of blood pressure at goal levels.   Lipids Yes   Intervention Provide education and  support for participant on nutrition & aerobic/resistive exercise along with prescribed medications to achieve LDL 70mg , HDL >40mg .   Expected Outcomes Short Term: Participant states understanding of desired cholesterol values and is compliant with medications prescribed. Participant is following exercise prescription and nutrition guidelines.;Long Term: Cholesterol controlled with medications as prescribed, with individualized exercise RX and with personalized nutrition plan. Value goals: LDL < 70mg , HDL > 40 mg.   Stress Yes   Intervention Offer individual and/or small group education and counseling on adjustment to heart disease, stress management and health-related  lifestyle change. Teach and support self-help strategies.;Refer participants experiencing significant psychosocial distress to appropriate mental health specialists for further evaluation and treatment. When possible, include family members and significant others in education/counseling sessions.   Expected Outcomes Short Term: Participant demonstrates changes in health-related behavior, relaxation and other stress management skills, ability to obtain effective social support, and compliance with psychotropic medications if prescribed.;Long Term: Emotional wellbeing is indicated by absence of clinically significant psychosocial distress or social isolation.   Personal Goal Other Yes   Personal Goal Back to jogging and riding bike and gym   Intervention Provide exercise prescription for in program and at home   Expected Outcomes Able to return to activities      Core Components/Risk Factors/Patient Goals Review:    Core Components/Risk Factors/Patient Goals at Discharge (Final Review):    ITP Comments:     ITP Comments      07/03/15 1309           ITP Comments Medical Director-Dr. Fransico Him, MD          Comments: 24 DAY ITP REVIEW Pt is making expected progress toward personal goals after completing 6sessions.   Recommend continued exercise and life style modification education including  stress management and relaxation techniques to decrease cardiac risk profile.  Pt has significant stress and worry for his wife and her concern for his health. Pt offered reassurance and encouragement. Pt also offered consultation with Jeanella Craze to discuss his concerns about coping with illness and recovery. Pt will notify staff if ready to schedule the appointment.    Will continue to monitor.

## 2015-07-17 ENCOUNTER — Encounter (HOSPITAL_COMMUNITY)
Admission: RE | Admit: 2015-07-17 | Discharge: 2015-07-17 | Disposition: A | Discharge status: Home / Self Care | Payer: PPO | Source: Ambulatory Visit | Attending: Cardiology | Admitting: Cardiology

## 2015-07-17 DIAGNOSIS — I214 Non-ST elevation (NSTEMI) myocardial infarction: Secondary | ICD-10-CM | POA: Diagnosis not present

## 2015-07-19 ENCOUNTER — Encounter (HOSPITAL_COMMUNITY): Payer: PPO

## 2015-07-22 ENCOUNTER — Encounter (HOSPITAL_COMMUNITY)
Admission: RE | Admit: 2015-07-22 | Discharge: 2015-07-22 | Disposition: A | Discharge status: Home / Self Care | Payer: PPO | Source: Ambulatory Visit | Attending: Cardiology | Admitting: Cardiology

## 2015-07-22 DIAGNOSIS — Z955 Presence of coronary angioplasty implant and graft: Secondary | ICD-10-CM

## 2015-07-22 DIAGNOSIS — I214 Non-ST elevation (NSTEMI) myocardial infarction: Secondary | ICD-10-CM

## 2015-07-22 NOTE — Progress Notes (Signed)
Lawrence Hall 70 y.o. male Nutrition Note Spoke with pt. Nutrition Survey reviewed with pt. Pt is following Step 2 of the Therapeutic Lifestyle Changes diet. Pt expressed understanding of the information reviewed. Pt aware of nutrition education classes offered and plans on attending nutrition classes. No results found for: HGBA1C Wt Readings from Last 3 Encounters:  07/02/15 227 lb 15.3 oz (103.4 kg)  06/12/15 229 lb (103.874 kg)  06/04/15 236 lb 15.9 oz (107.5 kg)   Nutrition Diagnosis ? Food-and nutrition-related knowledge deficit related to lack of exposure to information as related to diagnosis of: ? CVD ? Obesity related to excessive energy intake as evidenced by a BMI of 31.6 Nutrition Intervention ? Benefits of adopting Therapeutic Lifestyle Changes discussed when Medficts reviewed. ? Pt to attend the Portion Distortion class ? Pt to attend the  ? Nutrition I class                     ? Nutrition II class ? Continue client-centered nutrition education by RD, as part of interdisciplinary care.  Goal(s) ? Pt to identify food quantities necessary to achieve weight loss of 6-24 lb (2.7-10.9 kg) at graduation from cardiac rehab.  ? Pt to watch out for added sugar (e.g. Raisin Bran, convenience food)  Monitor and Evaluate progress toward nutrition goal with team.  Derek Mound, M.Ed, RD, LDN, CDE 07/22/2015 2:22 PM

## 2015-07-24 ENCOUNTER — Encounter (HOSPITAL_COMMUNITY)
Admission: RE | Admit: 2015-07-24 | Discharge: 2015-07-24 | Disposition: A | Discharge status: Home / Self Care | Payer: PPO | Source: Ambulatory Visit | Attending: Cardiology | Admitting: Cardiology

## 2015-07-24 DIAGNOSIS — I214 Non-ST elevation (NSTEMI) myocardial infarction: Secondary | ICD-10-CM | POA: Diagnosis not present

## 2015-07-24 DIAGNOSIS — Z955 Presence of coronary angioplasty implant and graft: Secondary | ICD-10-CM

## 2015-07-25 ENCOUNTER — Telehealth: Payer: Self-pay

## 2015-07-25 ENCOUNTER — Encounter: Payer: Self-pay | Admitting: Physician Assistant

## 2015-07-25 NOTE — Telephone Encounter (Signed)
Error

## 2015-07-26 ENCOUNTER — Telehealth: Payer: Self-pay | Admitting: Cardiovascular Disease

## 2015-07-26 ENCOUNTER — Encounter (HOSPITAL_COMMUNITY)
Admission: RE | Admit: 2015-07-26 | Discharge: 2015-07-26 | Disposition: A | Discharge status: Home / Self Care | Payer: PPO | Source: Ambulatory Visit | Attending: Cardiology | Admitting: Cardiology

## 2015-07-26 ENCOUNTER — Encounter: Payer: Self-pay | Admitting: Cardiovascular Disease

## 2015-07-26 DIAGNOSIS — Z955 Presence of coronary angioplasty implant and graft: Secondary | ICD-10-CM

## 2015-07-26 DIAGNOSIS — I214 Non-ST elevation (NSTEMI) myocardial infarction: Secondary | ICD-10-CM

## 2015-07-26 NOTE — Telephone Encounter (Signed)
Dr. Sallyanne Kuster sent clearance to Dr. Mikey Bussing, DDS for potential surgery for peridontal problems thru epic. Letter documented in system for reference.

## 2015-07-29 ENCOUNTER — Encounter (HOSPITAL_COMMUNITY)
Admission: RE | Admit: 2015-07-29 | Discharge: 2015-07-29 | Disposition: A | Discharge status: Home / Self Care | Payer: PPO | Source: Ambulatory Visit | Attending: Cardiology | Admitting: Cardiology

## 2015-07-29 DIAGNOSIS — I214 Non-ST elevation (NSTEMI) myocardial infarction: Secondary | ICD-10-CM

## 2015-07-29 DIAGNOSIS — Z955 Presence of coronary angioplasty implant and graft: Secondary | ICD-10-CM

## 2015-07-29 NOTE — Progress Notes (Signed)
Reviewed home exercise with pt today.  Pt plans to walk and go to [employer] [facility] for exercise on Tues. Thurs./Sat.  Reviewed THR, pulse, RPE, sign and symptoms, NTG use, and when to call 911 or MD.  Also discussed weather considerations and indoor options.  Pt voiced understanding.    Lawrence Hall [employer]

## 2015-07-31 ENCOUNTER — Encounter (HOSPITAL_COMMUNITY)
Admission: RE | Admit: 2015-07-31 | Discharge: 2015-07-31 | Disposition: A | Discharge status: Home / Self Care | Payer: PPO | Source: Ambulatory Visit | Attending: Cardiology | Admitting: Cardiology

## 2015-07-31 DIAGNOSIS — Z955 Presence of coronary angioplasty implant and graft: Secondary | ICD-10-CM

## 2015-07-31 DIAGNOSIS — I214 Non-ST elevation (NSTEMI) myocardial infarction: Secondary | ICD-10-CM | POA: Diagnosis not present

## 2015-08-02 ENCOUNTER — Encounter (HOSPITAL_COMMUNITY)
Admission: RE | Admit: 2015-08-02 | Discharge: 2015-08-02 | Disposition: A | Discharge status: Home / Self Care | Payer: PPO | Source: Ambulatory Visit | Attending: Cardiology | Admitting: Cardiology

## 2015-08-02 DIAGNOSIS — I214 Non-ST elevation (NSTEMI) myocardial infarction: Secondary | ICD-10-CM | POA: Diagnosis not present

## 2015-08-02 DIAGNOSIS — Z955 Presence of coronary angioplasty implant and graft: Secondary | ICD-10-CM

## 2015-08-05 ENCOUNTER — Encounter (HOSPITAL_COMMUNITY)
Admission: RE | Admit: 2015-08-05 | Discharge: 2015-08-05 | Disposition: A | Discharge status: Home / Self Care | Payer: PPO | Source: Ambulatory Visit | Attending: Cardiology | Admitting: Cardiology

## 2015-08-05 DIAGNOSIS — I214 Non-ST elevation (NSTEMI) myocardial infarction: Secondary | ICD-10-CM

## 2015-08-07 ENCOUNTER — Encounter (HOSPITAL_COMMUNITY)
Admission: RE | Admit: 2015-08-07 | Discharge: 2015-08-07 | Disposition: A | Discharge status: Home / Self Care | Payer: PPO | Source: Ambulatory Visit | Attending: Cardiology | Admitting: Cardiology

## 2015-08-07 DIAGNOSIS — I214 Non-ST elevation (NSTEMI) myocardial infarction: Secondary | ICD-10-CM | POA: Diagnosis not present

## 2015-08-07 DIAGNOSIS — Z955 Presence of coronary angioplasty implant and graft: Secondary | ICD-10-CM

## 2015-08-09 ENCOUNTER — Encounter: Payer: Self-pay | Admitting: Physician Assistant

## 2015-08-09 ENCOUNTER — Encounter (HOSPITAL_COMMUNITY)
Admission: RE | Admit: 2015-08-09 | Discharge: 2015-08-09 | Disposition: A | Discharge status: Home / Self Care | Payer: PPO | Source: Ambulatory Visit | Attending: Cardiology | Admitting: Cardiology

## 2015-08-09 DIAGNOSIS — Z955 Presence of coronary angioplasty implant and graft: Secondary | ICD-10-CM

## 2015-08-09 DIAGNOSIS — I10 Essential (primary) hypertension: Secondary | ICD-10-CM | POA: Diagnosis not present

## 2015-08-09 DIAGNOSIS — E782 Mixed hyperlipidemia: Secondary | ICD-10-CM | POA: Diagnosis not present

## 2015-08-09 DIAGNOSIS — I214 Non-ST elevation (NSTEMI) myocardial infarction: Secondary | ICD-10-CM | POA: Diagnosis not present

## 2015-08-12 ENCOUNTER — Encounter (HOSPITAL_COMMUNITY)
Admission: RE | Admit: 2015-08-12 | Discharge: 2015-08-12 | Disposition: A | Discharge status: Home / Self Care | Payer: PPO | Source: Ambulatory Visit | Attending: Cardiology | Admitting: Cardiology

## 2015-08-12 DIAGNOSIS — I214 Non-ST elevation (NSTEMI) myocardial infarction: Secondary | ICD-10-CM

## 2015-08-12 DIAGNOSIS — Z955 Presence of coronary angioplasty implant and graft: Secondary | ICD-10-CM

## 2015-08-13 NOTE — Progress Notes (Signed)
Cardiac Individual Treatment Plan  Patient Details  Name: Lawrence Hall MRN: [id number] Date of Birth: 15-Jan-1946 Referring Provider:        CARDIAC REHAB PHASE II EXERCISE from 07/31/2015 in [place]   Referring Provider  [name], [place] MD      Initial Encounter Date:       CARDIAC REHAB PHASE II EXERCISE from 07/31/2015 in [place]   Date  07/02/15   Referring Provider  [name], [name]      Visit Diagnosis: NSTEMI (non-ST elevated myocardial infarction) [facility])  Stented coronary artery  Patient's Home Medications on Admission:  Current outpatient prescriptions:  .  aspirin EC 81 MG EC tablet, Take 1 tablet (81 mg total) by mouth daily., Disp: 30 tablet, Rfl: 0 .  B Complex-C (SUPER B COMPLEX PO), Take 1 tablet by mouth., Disp: , Rfl:  .  cholecalciferol (VITAMIN D) 1000 units tablet, Take 1,000 Units by mouth daily., Disp: , Rfl:  .  Coenzyme Q10 (COQ10) 200 MG CAPS, Take 200 mg by mouth daily., Disp: , Rfl:  .  MAGNESIUM-ZINC PO, Take 1 tablet by mouth., Disp: , Rfl:  .  metoprolol succinate (TOPROL-XL) 25 MG 24 hr tablet, Take 1 tablet (25 mg total) by mouth daily., Disp: 30 tablet, Rfl: 11 .  nitroGLYCERIN (NITROSTAT) 0.4 MG SL tablet, Place 1 tablet (0.4 mg total) under the tongue every 5 (five) minutes as needed for chest pain., Disp: 25 tablet, Rfl: 6 .  omega-3 acid ethyl esters (LOVAZA) 1 g capsule, Take 1 g by mouth daily., Disp: , Rfl:  .  rosuvastatin (CRESTOR) 20 MG tablet, Take 1 tablet (20 mg total) by mouth daily., Disp: 30 tablet, Rfl: 6 .  ticagrelor (BRILINTA) 90 MG TABS tablet, Take 1 tablet (90 mg total) by mouth 2 (two) times daily., Disp: 60 tablet, Rfl: 2 .  vitamin C (ASCORBIC ACID) 500 MG tablet, Take 500 mg by mouth daily., Disp: , Rfl:  .  vitamin E 400 UNIT capsule, Take 400 Units by mouth daily., Disp: , Rfl:   Past Medical History: Past Medical History  Diagnosis Date  . Prostate  cancer ([place]) 03/27/2011    prostate bx=adenocarcinoma,  . Asthma   . Hypercholesterolemia   . Skin cancer   . ED (erectile dysfunction)   . Cataract     early signs b/l eyes  . Arthritis     mild hands  . Skin cancer 2008    basal cell face /removed  . Dysrhythmia     PAC'S  . H/O hiatal hernia   . Hypertension     MARGINALLY HIGH - NO MEDS -MONITORS  . Sleep apnea     USES NOSTRIL APARATUS - NO C-PAP- "BORDERLINE SLEEP APMNEA"    Tobacco Use: History  Smoking status  . Never Smoker   Smokeless tobacco  . Not on file    Labs:     Recent Review Flowsheet Data    Labs for ITP Cardiac and Pulmonary Rehab Latest Ref Rng 06/01/2015   Cholestrol 0 - 200 mg/dL 223(H)   LDLCALC 0 - 99 mg/dL 126(H)   HDL >40 mg/dL 45   Trlycerides <150 mg/dL 258(H)      Capillary Blood Glucose: No results found for: GLUCAP   Exercise Target Goals:    Exercise Program Goal: Individual exercise prescription set with THRR, safety & activity barriers. Participant demonstrates ability to understand and report RPE using BORG  scale, to self-measure pulse accurately, and to acknowledge the importance of the exercise prescription.  Exercise Prescription Goal: Starting with aerobic activity 30 plus minutes a day, 3 days per week for initial exercise prescription. Provide home exercise prescription and guidelines that participant acknowledges understanding prior to discharge.  Activity Barriers & Risk Stratification:     Activity Barriers & Cardiac Risk Stratification - 07/02/15 1451    Activity Barriers & Cardiac Risk Stratification   Cardiac Risk Stratification High      6 Minute Walk:     6 Minute Walk      07/02/15 1626       6 Minute Walk   Phase Initial     Distance 2049 feet     Walk Time 6 minutes     # of Rest Breaks 0     MPH 3.88     METS 4.11     RPE 13     VO2 Peak 14.38     Symptoms No     Resting HR 73 bpm     Resting BP 120/80 mmHg     Max Ex. HR 107 bpm      Max Ex. BP 142/74 mmHg        Initial Exercise Prescription:     Initial Exercise Prescription - 08/01/15 1400    Date of Initial Exercise RX and Referring Provider   Date 07/02/15   Referring Provider [name], [name]   Treadmill   MPH 3.2   Grade 3   Minutes 10   METs 4.7   Bike   Level 1.7   Minutes 10   METs 4.1   NuStep   Level 3   Minutes 10   METs 4.3   Resistance Training   Training Prescription No   Weight --  Progressive muscle relaxation and medititation      Perform Capillary Blood Glucose checks as needed.  Exercise Prescription Changes:      Exercise Prescription Changes      07/12/15 1000 07/18/15 1200 07/26/15 1300 08/07/15 1400 08/15/15 1100   Exercise Review   Progression Yes Yes  Yes Yes   Response to Exercise   Blood Pressure (Admit)  138/80 mmHg  132/78 mmHg 124/68 mmHg   Blood Pressure (Exercise)  138/70 mmHg  112/60 mmHg 142/76 mmHg   Blood Pressure (Exit)  114/60 mmHg  126/82 mmHg 120/80 mmHg   Heart Rate (Admit)  69 bpm  75 bpm 78 bpm   Heart Rate (Exercise)  111 bpm  125 bpm 119 bpm   Heart Rate (Exit)  79 bpm  88 bpm 76 bpm   Rating of Perceived Exertion (Exercise)     12   Duration     Progress to 30 minutes of continuous aerobic without signs/symptoms of physical distress   Intensity     THRR unchanged   Progression   Average METs   4.5 4.5    Resistance Training   Training Prescription Yes No  Yes Yes   Weight 3lbs --  Progressive muscle relaxation and medititation  3lbs 3lbs   Reps 10-12   10-12 10-12   Treadmill   MPH 3 3.2  3.4 3.5   Grade 2 3  4 5    Minutes 10 10  10 10    METs 4.1 4.7  5.48 6.09   Bike   Level 1.7 1.7  1.2  dec. in WL and METs due to incr. HR 1.2   Minutes 10 10  10 10   METs 4.1 4.1  3.22 3.25   NuStep   Level 3 3  3     Minutes 10 10  10     METs 2.3 4.3  3.5    Rower   Level     3   Watts     60   Minutes     10   Home Exercise Plan   Plans to continue exercise at     Home  walk at  "[employer]" fitness center   Frequency     Add 3 additional days to program exercise sessions.  reviewed on 07/26/15      Exercise Comments:      Exercise Comments      07/18/15 1241 08/08/15 0717 08/15/15 1140       Exercise Comments pt is progressing as tolerated and is responding exercise well. Will continue to monitor exercise progression. pt is progressing as tolerated and is responding exercise well. Will continue to monitor exercise progression. pt is progressing as tolerated and is responding to exercise well. Will continue to monitor exercise progression.        Discharge Exercise Prescription (Final Exercise Prescription Changes):     Exercise Prescription Changes - 08/15/15 1100    Exercise Review   Progression Yes   Response to Exercise   Blood Pressure (Admit) 124/68 mmHg   Blood Pressure (Exercise) 142/76 mmHg   Blood Pressure (Exit) 120/80 mmHg   Heart Rate (Admit) 78 bpm   Heart Rate (Exercise) 119 bpm   Heart Rate (Exit) 76 bpm   Rating of Perceived Exertion (Exercise) 12   Duration Progress to 30 minutes of continuous aerobic without signs/symptoms of physical distress   Intensity THRR unchanged   Resistance Training   Training Prescription Yes   Weight 3lbs   Reps 10-12   Treadmill   MPH 3.5   Grade 5   Minutes 10   METs 6.09   Bike   Level 1.2   Minutes 10   METs 3.25   Rower   Level 3   Watts 60   Minutes 10   Home Exercise Plan   Plans to continue exercise at Home  walk at "[employer]" fitness center   Frequency Add 3 additional days to program exercise sessions.  reviewed on 07/26/15      Nutrition:  Target Goals: Understanding of nutrition guidelines, daily intake of sodium 1500mg , cholesterol 200mg , calories 30% from fat and 7% or less from saturated fats, daily to have 5 or more servings of fruits and vegetables.  Biometrics:     Pre Biometrics - 07/02/15 1630    Pre Biometrics   Waist Circumference 43 inches   Hip Circumference  41.25 inches   Waist to Hip Ratio 1.04 %   Triceps Skinfold 18 mm   % Body Fat 30.9 %   Grip Strength 35 kg   Flexibility 7 in   Single Leg Stand 19.74 seconds       Nutrition Therapy Plan and Nutrition Goals:     Nutrition Therapy & Goals - 07/03/15 1513    Nutrition Therapy   Diet Therapeutic Lifestyle Changes   Personal Nutrition Goals   Personal Goal #1 6-24 lb (2.7-10.9 kg) wt loss at graduation from [facility] Goal #2 Long term weight loss goal weight 189 lb   Intervention Plan   Intervention Prescribe, educate and counsel regarding individualized specific dietary modifications aiming towards targeted core components such as  weight, hypertension, lipid management, diabetes, heart failure and other comorbidities.   Expected Outcomes Short Term Goal: Understand basic principles of dietary content, such as calories, fat, sodium, cholesterol and nutrients.;Long Term Goal: Adherence to prescribed nutrition plan.      Nutrition Discharge: Nutrition Scores:     Nutrition Assessments - 07/22/15 1421    MEDFICTS Scores   Pre Score 32      Nutrition Goals Re-Evaluation:   Psychosocial: Target Goals: Acknowledge presence or absence of depression, maximize coping skills, provide positive support system. Participant is able to verbalize types and ability to use techniques and skills needed for reducing stress and depression.  Initial Review & Psychosocial Screening:     Initial Psych Review & Screening - 07/08/15 1510    Family Dynamics   Good Support System? Yes   Comments significant stress from concern about his wife's worry for his health   Barriers   Psychosocial barriers to participate in program There are no identifiable barriers or psychosocial needs.   Screening Interventions   Interventions Encouraged to exercise      Quality of Life Scores:     Quality of Life - 07/03/15 1259    Quality of Life Scores   Health/Function Pre 21.43 %    Socioeconomic Pre 18.44 %   Psych/Spiritual Pre 22.71 %   Family Pre 20.1 %   GLOBAL Pre 20.81 %      PHQ-9:     Recent Review Flowsheet Data    Depression screen [facility] 2/9 07/08/2015   Decreased Interest 0   Down, Depressed, Hopeless 0   PHQ - 2 Score 0      Psychosocial Evaluation and Intervention:     Psychosocial Evaluation - 08/12/15 1348    Psychosocial Evaluation & Interventions   Interventions Stress management education;Relaxation education;Encouraged to exercise with the program and follow exercise prescription   Continued Psychosocial Services Needed Yes      Psychosocial Re-Evaluation:     Psychosocial Re-Evaluation      07/16/15 1651           Psychosocial Re-Evaluation   Interventions Stress management education;Encouraged to attend Cardiac Rehabilitation for the exercise;Relaxation education       Continued Psychosocial Services Needed Yes          Vocational Rehabilitation: Provide vocational rehab assistance to qualifying candidates.   Vocational Rehab Evaluation & Intervention:   Education: Education Goals: Education classes will be provided on a weekly basis, covering required topics. Participant will state understanding/return demonstration of topics presented.  Learning Barriers/Preferences:     Learning Barriers/Preferences - 07/02/15 1450    Learning Barriers/Preferences   Learning Barriers None   Learning Preferences Audio;Computer/Internet;Group Instruction;Skilled Demonstration;Verbal Instruction;Written Material      Education Topics: Count Your Pulse:  -Group instruction provided by verbal instruction, demonstration, patient participation and written materials to support subject.  Instructors address importance of being able to find your pulse and how to count your pulse when at home without a heart monitor.  Patients get hands on experience counting their pulse with staff help and individually.      CARDIAC REHAB PHASE II  EXERCISE from 08/09/2015 in [place]   Date  07/26/15   Instruction Review Code  2- meets goals/outcomes      Heart Attack, Angina, and Risk Factor Modification:  -Group instruction provided by verbal instruction, video, and written materials to support subject.  Instructors address signs and symptoms of angina and heart  attacks.    Also discuss risk factors for heart disease and how to make changes to improve heart health risk factors.   Functional Fitness:  -Group instruction provided by verbal instruction, demonstration, patient participation, and written materials to support subject.  Instructors address safety measures for doing things around the house.  Discuss how to get up and down off the floor, how to pick things up properly, how to safely get out of a chair without assistance, and balance training.      CARDIAC REHAB PHASE II EXERCISE from 08/09/2015 in [place]   Date  08/09/15   Educator  Tom Green   Instruction Review Code  2- meets goals/outcomes      Meditation and Mindfulness:  -Group instruction provided by verbal instruction, patient participation, and written materials to support subject.  Instructor addresses importance of mindfulness and meditation practice to help reduce stress and improve awareness.  Instructor also leads participants through a meditation exercise.    Stretching for Flexibility and Mobility:  -Group instruction provided by verbal instruction, patient participation, and written materials to support subject.  Instructors lead participants through series of stretches that are designed to increase flexibility thus improving mobility.  These stretches are additional exercise for major muscle groups that are typically performed during regular warm up and cool down.   Hands Only CPR Anytime:  -Group instruction provided by verbal instruction, video, patient participation and written  materials to support subject.  Instructors co-teach with AHA video for hands only CPR.  Participants get hands on experience with mannequins.   Nutrition I class: Heart Healthy Eating:  -Group instruction provided by PowerPoint slides, verbal discussion, and written materials to support subject matter. The instructor gives an explanation and review of the Therapeutic Lifestyle Changes diet recommendations, which includes a discussion on lipid goals, dietary fat, sodium, fiber, plant stanol/sterol esters, sugar, and the components of a well-balanced, healthy diet.   Nutrition II class: Lifestyle Skills:  -Group instruction provided by PowerPoint slides, verbal discussion, and written materials to support subject matter. The instructor gives an explanation and review of label reading, grocery shopping for heart health, heart healthy recipe modifications, and ways to make healthier choices when eating out.   Diabetes Question & Answer:  -Group instruction provided by PowerPoint slides, verbal discussion, and written materials to support subject matter. The instructor gives an explanation and review of diabetes co-morbidities, pre- and post-prandial blood glucose goals, pre-exercise blood glucose goals, signs, symptoms, and treatment of hypoglycemia and hyperglycemia, and foot care basics.   Diabetes Blitz:  -Group instruction provided by PowerPoint slides, verbal discussion, and written materials to support subject matter. The instructor gives an explanation and review of the physiology behind type 1 and type 2 diabetes, diabetes medications and rational behind using different medications, pre- and post-prandial blood glucose recommendations and Hemoglobin A1c goals, diabetes diet, and exercise including blood glucose guidelines for exercising safely.    Portion Distortion:  -Group instruction provided by PowerPoint slides, verbal discussion, written materials, and food models to support subject  matter. The instructor gives an explanation of serving size versus portion size, changes in portions sizes over the last 20 years, and what consists of a serving from each food group.   Stress Management:  -Group instruction provided by verbal instruction, video, and written materials to support subject matter.  Instructors review role of stress in heart disease and how to cope with stress positively.  CARDIAC REHAB PHASE II EXERCISE from 08/09/2015 in [place]   Date  08/07/15   Instruction Review Code  2- meets goals/outcomes      Exercising on Your Own:  -Group instruction provided by verbal instruction, power point, and written materials to support subject.  Instructors discuss benefits of exercise, components of exercise, frequency and intensity of exercise, and end points for exercise.  Also discuss use of nitroglycerin and activating EMS.  Review options of places to exercise outside of rehab.  Review guidelines for sex with heart disease.   Cardiac Drugs I:  -Group instruction provided by verbal instruction and written materials to support subject.  Instructor reviews cardiac drug classes: antiplatelets, anticoagulants, beta blockers, and statins.  Instructor discusses reasons, side effects, and lifestyle considerations for each drug class.      CARDIAC REHAB PHASE II EXERCISE from 08/09/2015 in [place]   Date  07/31/15   Educator  Pharm D   Instruction Review Code  2- meets goals/outcomes      Cardiac Drugs II:  -Group instruction provided by verbal instruction and written materials to support subject.  Instructor reviews cardiac drug classes: angiotensin converting enzyme inhibitors (ACE-I), angiotensin II receptor blockers (ARBs), nitrates, and calcium channel blockers.  Instructor discusses reasons, side effects, and lifestyle considerations for each drug class.          CARDIAC REHAB PHASE II EXERCISE  from 08/09/2015 in [place]   Date  07/10/15   Instruction Review Code  2- meets goals/outcomes      Anatomy and Physiology of the Circulatory System:  -Group instruction provided by verbal instruction, video, and written materials to support subject.  Reviews functional anatomy of heart, how it relates to various diagnoses, and what role the heart plays in the overall system.   Knowledge Questionnaire Score:     Knowledge Questionnaire Score - 07/03/15 1259    Knowledge Questionnaire Score   Pre Score 20/24      Core Components/Risk Factors/Patient Goals at Admission:     Personal Goals and Risk Factors at Admission - 08/07/15 1447    Core Components/Risk Factors/Patient Goals on Admission   Expected Outcomes Weight Loss: Understanding of general recommendations for a balanced deficit meal plan, which promotes 1-2 lb weight loss per week and includes a negative energy balance of [phone] kcal/d   Personal Goal continue to work on weightloss, down 2.5 lbs since start of program   Intervention increase physical activity output and decrease caloric intake   Expected Outcomes increased exercise capacity and weigh loss      Core Components/Risk Factors/Patient Goals Review:      Goals and Risk Factor Review      07/26/15 1354 07/26/15 1356         Core Components/Risk Factors/Patient Goals Review   Personal Goals Review Weight Management/Obesity Stress      Review no weightloss just yet, however gaining muscle mass  finding inner happy place      Expected Outcomes continue to monitor weightloss and increase activity levels better stress management and breathing mechanics         Core Components/Risk Factors/Patient Goals at Discharge (Final Review):      Goals and Risk Factor Review - 07/26/15 1356    Core Components/Risk Factors/Patient Goals Review   Personal Goals Review Stress   Review finding inner happy place   Expected Outcomes  better stress management and  breathing mechanics      ITP Comments:     ITP Comments      07/03/15 1309           ITP Comments Medical Director-Dr. Fransico Him, MD          Comments: Pt is making expected progress toward personal goals after completing 17 sessions. Recommend continued exercise and life style modification education including  stress management and relaxation techniques to decrease cardiac risk profile.

## 2015-08-14 ENCOUNTER — Encounter (HOSPITAL_COMMUNITY)
Admission: RE | Admit: 2015-08-14 | Discharge: 2015-08-14 | Disposition: A | Discharge status: Home / Self Care | Payer: PPO | Source: Ambulatory Visit | Attending: Cardiology | Admitting: Cardiology

## 2015-08-14 DIAGNOSIS — I214 Non-ST elevation (NSTEMI) myocardial infarction: Secondary | ICD-10-CM

## 2015-08-14 DIAGNOSIS — Z955 Presence of coronary angioplasty implant and graft: Secondary | ICD-10-CM

## 2015-08-15 ENCOUNTER — Telehealth: Payer: Self-pay | Admitting: *Deleted

## 2015-08-15 NOTE — Telephone Encounter (Signed)
Patient walked in from the dental office\ Patient states he had a deep cleaning today and has a lot bleeding from that area - the dentist office informed patient he will have a clot at the area may have bleeding  patient wanted know what to do this afternoon or to night  RN spoke to Dr Sallyanne Kuster-  Per Dr Sallyanne Kuster , patient will have to continue taking Brilinta ,if bleeding occurs and does not stop- apply  Pressure with a gauze until bleeding stops.if  it doesn't stop go to ER   PATIENT VERBALIZED UNDERSTANDING

## 2015-08-16 ENCOUNTER — Encounter (HOSPITAL_COMMUNITY)
Admission: RE | Admit: 2015-08-16 | Discharge: 2015-08-16 | Disposition: A | Discharge status: Home / Self Care | Payer: PPO | Source: Ambulatory Visit | Attending: Cardiology | Admitting: Cardiology

## 2015-08-16 DIAGNOSIS — I214 Non-ST elevation (NSTEMI) myocardial infarction: Secondary | ICD-10-CM | POA: Diagnosis not present

## 2015-08-19 ENCOUNTER — Encounter (HOSPITAL_COMMUNITY)
Admission: RE | Admit: 2015-08-19 | Discharge: 2015-08-19 | Disposition: A | Discharge status: Home / Self Care | Payer: PPO | Source: Ambulatory Visit | Attending: Cardiology | Admitting: Cardiology

## 2015-08-19 DIAGNOSIS — I214 Non-ST elevation (NSTEMI) myocardial infarction: Secondary | ICD-10-CM | POA: Diagnosis not present

## 2015-08-19 DIAGNOSIS — Z955 Presence of coronary angioplasty implant and graft: Secondary | ICD-10-CM | POA: Insufficient documentation

## 2015-08-21 ENCOUNTER — Encounter (HOSPITAL_COMMUNITY)
Admission: RE | Admit: 2015-08-21 | Discharge: 2015-08-21 | Disposition: A | Discharge status: Home / Self Care | Payer: PPO | Source: Ambulatory Visit | Attending: Cardiology | Admitting: Cardiology

## 2015-08-21 DIAGNOSIS — I214 Non-ST elevation (NSTEMI) myocardial infarction: Secondary | ICD-10-CM

## 2015-08-21 DIAGNOSIS — Z955 Presence of coronary angioplasty implant and graft: Secondary | ICD-10-CM

## 2015-08-23 ENCOUNTER — Encounter (HOSPITAL_COMMUNITY): Payer: PPO

## 2015-08-26 ENCOUNTER — Encounter (HOSPITAL_COMMUNITY)
Admission: RE | Admit: 2015-08-26 | Discharge: 2015-08-26 | Disposition: A | Discharge status: Home / Self Care | Payer: PPO | Source: Ambulatory Visit | Attending: Cardiology | Admitting: Cardiology

## 2015-08-26 DIAGNOSIS — I214 Non-ST elevation (NSTEMI) myocardial infarction: Secondary | ICD-10-CM | POA: Diagnosis not present

## 2015-08-26 DIAGNOSIS — Z955 Presence of coronary angioplasty implant and graft: Secondary | ICD-10-CM

## 2015-08-28 ENCOUNTER — Encounter (HOSPITAL_COMMUNITY)
Admission: RE | Admit: 2015-08-28 | Discharge: 2015-08-28 | Disposition: A | Discharge status: Home / Self Care | Payer: PPO | Source: Ambulatory Visit | Attending: Cardiology | Admitting: Cardiology

## 2015-08-28 DIAGNOSIS — I214 Non-ST elevation (NSTEMI) myocardial infarction: Secondary | ICD-10-CM | POA: Diagnosis not present

## 2015-08-28 DIAGNOSIS — Z955 Presence of coronary angioplasty implant and graft: Secondary | ICD-10-CM

## 2015-08-30 ENCOUNTER — Encounter (HOSPITAL_COMMUNITY)
Admission: RE | Admit: 2015-08-30 | Discharge: 2015-08-30 | Disposition: A | Discharge status: Home / Self Care | Payer: PPO | Source: Ambulatory Visit | Attending: Cardiology | Admitting: Cardiology

## 2015-08-30 DIAGNOSIS — Z955 Presence of coronary angioplasty implant and graft: Secondary | ICD-10-CM

## 2015-08-30 DIAGNOSIS — I214 Non-ST elevation (NSTEMI) myocardial infarction: Secondary | ICD-10-CM

## 2015-09-02 ENCOUNTER — Encounter (HOSPITAL_COMMUNITY)
Admission: RE | Admit: 2015-09-02 | Discharge: 2015-09-02 | Disposition: A | Discharge status: Home / Self Care | Payer: PPO | Source: Ambulatory Visit | Attending: Cardiology | Admitting: Cardiology

## 2015-09-02 DIAGNOSIS — I214 Non-ST elevation (NSTEMI) myocardial infarction: Secondary | ICD-10-CM

## 2015-09-02 DIAGNOSIS — Z955 Presence of coronary angioplasty implant and graft: Secondary | ICD-10-CM

## 2015-09-03 NOTE — Progress Notes (Signed)
Nutrition Note Spoke with pt. Pt states wt today 212 lb. Pt wants to know what his goal wt should be. Recommended goal wt at discharge discussed. Pt agreed a wt of 205-210 lb is a reasonable goal. Pt's wt, IBW, and BMI discussed in detail. Pt expressed understanding of the information reviewed. Continue client-centered nutrition education by RD as part of interdisciplinary care.  Monitor and evaluate progress toward nutrition goal with team.  Derek Mound, M.Ed, RD, LDN, CDE 09/03/2015 12:12 PM

## 2015-09-04 ENCOUNTER — Encounter (HOSPITAL_COMMUNITY)
Admission: RE | Admit: 2015-09-04 | Discharge: 2015-09-04 | Disposition: A | Discharge status: Home / Self Care | Payer: PPO | Source: Ambulatory Visit | Attending: Cardiology | Admitting: Cardiology

## 2015-09-04 DIAGNOSIS — I214 Non-ST elevation (NSTEMI) myocardial infarction: Secondary | ICD-10-CM | POA: Diagnosis not present

## 2015-09-04 DIAGNOSIS — Z955 Presence of coronary angioplasty implant and graft: Secondary | ICD-10-CM

## 2015-09-06 ENCOUNTER — Encounter (HOSPITAL_COMMUNITY)
Admission: RE | Admit: 2015-09-06 | Discharge: 2015-09-06 | Disposition: A | Discharge status: Home / Self Care | Payer: PPO | Source: Ambulatory Visit | Attending: Cardiology | Admitting: Cardiology

## 2015-09-06 DIAGNOSIS — I214 Non-ST elevation (NSTEMI) myocardial infarction: Secondary | ICD-10-CM

## 2015-09-06 DIAGNOSIS — Z955 Presence of coronary angioplasty implant and graft: Secondary | ICD-10-CM

## 2015-09-06 NOTE — Progress Notes (Signed)
Cardiac Individual Treatment Plan  Patient Details  Name: Lawrence Hall MRN: [id number] Date of Birth: 1946-03-22 Referring Provider:        CARDIAC REHAB PHASE II EXERCISE from 07/31/2015 in [place]   Referring Provider  Alyson Locket MD      Initial Encounter Date:       CARDIAC REHAB PHASE II EXERCISE from 07/31/2015 in [place]   Date  07/02/15   Referring Provider  [name], Arelia Sneddon MD      Visit Diagnosis: No diagnosis found.  Patient's Home Medications on Admission:  Current outpatient prescriptions:  .  aspirin EC 81 MG EC tablet, Take 1 tablet (81 mg total) by mouth daily., Disp: 30 tablet, Rfl: 0 .  B Complex-C (SUPER B COMPLEX PO), Take 1 tablet by mouth., Disp: , Rfl:  .  cholecalciferol (VITAMIN D) 1000 units tablet, Take 1,000 Units by mouth daily., Disp: , Rfl:  .  Coenzyme Q10 (COQ10) 200 MG CAPS, Take 200 mg by mouth daily., Disp: , Rfl:  .  MAGNESIUM-ZINC PO, Take 1 tablet by mouth., Disp: , Rfl:  .  metoprolol succinate (TOPROL-XL) 25 MG 24 hr tablet, Take 1 tablet (25 mg total) by mouth daily., Disp: 30 tablet, Rfl: 11 .  nitroGLYCERIN (NITROSTAT) 0.4 MG SL tablet, Place 1 tablet (0.4 mg total) under the tongue every 5 (five) minutes as needed for chest pain., Disp: 25 tablet, Rfl: 6 .  omega-3 acid ethyl esters (LOVAZA) 1 g capsule, Take 1 g by mouth daily., Disp: , Rfl:  .  rosuvastatin (CRESTOR) 20 MG tablet, Take 1 tablet (20 mg total) by mouth daily., Disp: 30 tablet, Rfl: 6 .  ticagrelor (BRILINTA) 90 MG TABS tablet, Take 1 tablet (90 mg total) by mouth 2 (two) times daily., Disp: 60 tablet, Rfl: 2 .  vitamin C (ASCORBIC ACID) 500 MG tablet, Take 500 mg by mouth daily., Disp: , Rfl:  .  vitamin E 400 UNIT capsule, Take 400 Units by mouth daily., Disp: , Rfl:   Past Medical History: Past Medical History  Diagnosis Date  . Prostate cancer ([place]) 03/27/2011    prostate bx=adenocarcinoma,  .  Asthma   . Hypercholesterolemia   . Skin cancer   . ED (erectile dysfunction)   . Cataract     early signs b/l eyes  . Arthritis     mild hands  . Skin cancer 2008    basal cell face /removed  . Dysrhythmia     PAC'S  . H/O hiatal hernia   . Hypertension     MARGINALLY HIGH - NO MEDS -MONITORS  . Sleep apnea     USES NOSTRIL APARATUS - NO C-PAP- "BORDERLINE SLEEP APMNEA"    Tobacco Use: History  Smoking status  . Never Smoker   Smokeless tobacco  . Not on file    Labs: Recent Review Flowsheet Data    Labs for ITP Cardiac and Pulmonary Rehab Latest Ref Rng 06/01/2015   Cholestrol 0 - 200 mg/dL 223(H)   LDLCALC 0 - 99 mg/dL 126(H)   HDL >40 mg/dL 45   Trlycerides <150 mg/dL 258(H)      Capillary Blood Glucose: No results found for: GLUCAP   Exercise Target Goals:    Exercise Program Goal: Individual exercise prescription set with THRR, safety & activity barriers. Participant demonstrates ability to understand and report RPE using BORG scale, to self-measure pulse accurately, and to acknowledge the importance of  the exercise prescription.  Exercise Prescription Goal: Starting with aerobic activity 30 plus minutes a day, 3 days per week for initial exercise prescription. Provide home exercise prescription and guidelines that participant acknowledges understanding prior to discharge.  Activity Barriers & Risk Stratification:     Activity Barriers & Cardiac Risk Stratification - 07/02/15 1451    Activity Barriers & Cardiac Risk Stratification   Cardiac Risk Stratification High      6 Minute Walk:     6 Minute Walk      07/02/15 1626       6 Minute Walk   Phase Initial     Distance 2049 feet     Walk Time 6 minutes     # of Rest Breaks 0     MPH 3.88     METS 4.11     RPE 13     VO2 Peak 14.38     Symptoms No     Resting HR 73 bpm     Resting BP 120/80 mmHg     Max Ex. HR 107 bpm     Max Ex. BP 142/74 mmHg        Initial Exercise  Prescription:     Initial Exercise Prescription - 08/01/15 1400    Date of Initial Exercise RX and Referring Provider   Date 07/02/15   Referring Provider [name], [name]   Treadmill   MPH 3.2   Grade 3   Minutes 10   METs 4.7   Bike   Level 1.7   Minutes 10   METs 4.1   NuStep   Level 3   Minutes 10   METs 4.3   Resistance Training   Training Prescription No   Weight --  Progressive muscle relaxation and medititation      Perform Capillary Blood Glucose checks as needed.  Exercise Prescription Changes:     Exercise Prescription Changes      07/12/15 1000 07/18/15 1200 07/26/15 1300 08/07/15 1400 08/15/15 1100   Exercise Review   Progression Yes Yes  Yes Yes   Response to Exercise   Blood Pressure (Admit)  138/80 mmHg  132/78 mmHg 124/68 mmHg   Blood Pressure (Exercise)  138/70 mmHg  112/60 mmHg 142/76 mmHg   Blood Pressure (Exit)  114/60 mmHg  126/82 mmHg 120/80 mmHg   Heart Rate (Admit)  69 bpm  75 bpm 78 bpm   Heart Rate (Exercise)  111 bpm  125 bpm 119 bpm   Heart Rate (Exit)  79 bpm  88 bpm 76 bpm   Rating of Perceived Exertion (Exercise)     12   Duration     Progress to 30 minutes of continuous aerobic without signs/symptoms of physical distress   Intensity     THRR unchanged   Progression   Average METs   4.5 4.5    Resistance Training   Training Prescription Yes No  Yes Yes   Weight 3lbs --  Progressive muscle relaxation and medititation  3lbs 3lbs   Reps 10-12   10-12 10-12   Treadmill   MPH 3 3.2  3.4 3.5   Grade 2 3  4 5    Minutes 10 10  10 10    METs 4.1 4.7  5.48 6.09   Bike   Level 1.7 1.7  1.2  dec. in WL and METs due to incr. HR 1.2   Minutes 10 10  10 10    METs 4.1 4.1  3.22 3.25  NuStep   Level 3 3  3     Minutes 10 10  10     METs 2.3 4.3  3.5    Rower   Level     3   Watts     60   Minutes     10   Home Exercise Plan   Plans to continue exercise at     Home  walk at "[employer]" fitness center   Frequency     Add 3  additional days to program exercise sessions.  reviewed on 07/26/15     09/04/15 1700           Exercise Review   Progression Yes       Response to Exercise   Blood Pressure (Admit) 140/80 mmHg       Blood Pressure (Exercise) 154/76 mmHg       Blood Pressure (Exit) 120/70 mmHg       Heart Rate (Admit) 82 bpm       Heart Rate (Exercise) 124 bpm       Heart Rate (Exit) 85 bpm       Rating of Perceived Exertion (Exercise) 11       Duration Progress to 30 minutes of continuous aerobic without signs/symptoms of physical distress       Intensity THRR unchanged       Progression   Progression Continue to progress workloads to maintain intensity without signs/symptoms of physical distress.       Average METs 5.8       Resistance Training   Training Prescription Yes       Weight 3lbs       Reps 10-12       Treadmill   MPH 3.5       Grade 5       Minutes 10       METs 6.09       Bike   Level 1.2       Minutes 10       METs 3.25       Rower   Level 3       Watts 61       Minutes 10       METs 5.5          Exercise Comments:     Exercise Comments      07/18/15 1241 08/08/15 0717 08/15/15 1140 09/04/15 1713     Exercise Comments pt is progressing as tolerated and is responding exercise well. Will continue to monitor exercise progression. pt is progressing as tolerated and is responding exercise well. Will continue to monitor exercise progression. pt is progressing as tolerated and is responding to exercise well. Will continue to monitor exercise progression. pt is progressing as tolerated and is responding to exercise well. Will continue to monitor exercise progression.       Discharge Exercise Prescription (Final Exercise Prescription Changes):     Exercise Prescription Changes - 09/04/15 1700    Exercise Review   Progression Yes   Response to Exercise   Blood Pressure (Admit) 140/80 mmHg   Blood Pressure (Exercise) 154/76 mmHg   Blood Pressure (Exit) 120/70 mmHg    Heart Rate (Admit) 82 bpm   Heart Rate (Exercise) 124 bpm   Heart Rate (Exit) 85 bpm   Rating of Perceived Exertion (Exercise) 11   Duration Progress to 30 minutes of continuous aerobic without signs/symptoms of physical distress   Intensity THRR unchanged   Progression  Progression Continue to progress workloads to maintain intensity without signs/symptoms of physical distress.   Average METs 5.8   Resistance Training   Training Prescription Yes   Weight 3lbs   Reps 10-12   Treadmill   MPH 3.5   Grade 5   Minutes 10   METs 6.09   Bike   Level 1.2   Minutes 10   METs 3.25   Rower   Level 3   Watts 61   Minutes 10   METs 5.5      Nutrition:  Target Goals: Understanding of nutrition guidelines, daily intake of sodium 1500mg , cholesterol 200mg , calories 30% from fat and 7% or less from saturated fats, daily to have 5 or more servings of fruits and vegetables.  Biometrics:     Pre Biometrics - 07/02/15 1630    Pre Biometrics   Waist Circumference 43 inches   Hip Circumference 41.25 inches   Waist to Hip Ratio 1.04 %   Triceps Skinfold 18 mm   % Body Fat 30.9 %   Grip Strength 35 kg   Flexibility 7 in   Single Leg Stand 19.74 seconds       Nutrition Therapy Plan and Nutrition Goals:     Nutrition Therapy & Goals - 07/03/15 1513    Nutrition Therapy   Diet Therapeutic Lifestyle Changes   Personal Nutrition Goals   Personal Goal #1 6-24 lb (2.7-10.9 kg) wt loss at graduation from [facility] Goal #2 Long term weight loss goal weight 189 lb   Intervention Plan   Intervention Prescribe, educate and counsel regarding individualized specific dietary modifications aiming towards targeted core components such as weight, hypertension, lipid management, diabetes, heart failure and other comorbidities.   Expected Outcomes Short Term Goal: Understand basic principles of dietary content, such as calories, fat, sodium, cholesterol and nutrients.;Long Term  Goal: Adherence to prescribed nutrition plan.      Nutrition Discharge: Nutrition Scores:     Nutrition Assessments - 07/22/15 1421    MEDFICTS Scores   Pre Score 32      Nutrition Goals Re-Evaluation:   Psychosocial: Target Goals: Acknowledge presence or absence of depression, maximize coping skills, provide positive support system. Participant is able to verbalize types and ability to use techniques and skills needed for reducing stress and depression.  Initial Review & Psychosocial Screening:     Initial Psych Review & Screening - 07/08/15 1510    Family Dynamics   Good Support System? Yes   Comments significant stress from concern about his wife's worry for his health   Barriers   Psychosocial barriers to participate in program There are no identifiable barriers or psychosocial needs.   Screening Interventions   Interventions Encouraged to exercise      Quality of Life Scores:     Quality of Life - 07/03/15 1259    Quality of Life Scores   Health/Function Pre 21.43 %   Socioeconomic Pre 18.44 %   Psych/Spiritual Pre 22.71 %   Family Pre 20.1 %   GLOBAL Pre 20.81 %      PHQ-9:     Recent Review Flowsheet Data    Depression screen [facility] 2/9 07/08/2015   Decreased Interest 0   Down, Depressed, Hopeless 0   PHQ - 2 Score 0      Psychosocial Evaluation and Intervention:     Psychosocial Evaluation - 08/12/15 1348    Psychosocial Evaluation & Interventions   Interventions Stress management education;Relaxation education;Encouraged  to exercise with the program and follow exercise prescription   Continued Psychosocial Services Needed Yes      Psychosocial Re-Evaluation:     Psychosocial Re-Evaluation      07/16/15 1651 09/06/15 0728         Psychosocial Re-Evaluation   Interventions Stress management education;Encouraged to attend Cardiac Rehabilitation for the exercise;Relaxation education Stress management education;Relaxation  education;Encouraged to attend Cardiac Rehabilitation for the exercise      Comments  continued stress psychosocial intervention encouraged  for overall wellbeing       Continued Psychosocial Services Needed Yes Yes         Vocational Rehabilitation: Provide vocational rehab assistance to qualifying candidates.   Vocational Rehab Evaluation & Intervention:   Education: Education Goals: Education classes will be provided on a weekly basis, covering required topics. Participant will state understanding/return demonstration of topics presented.  Learning Barriers/Preferences:     Learning Barriers/Preferences - 07/02/15 1450    Learning Barriers/Preferences   Learning Barriers None   Learning Preferences Audio;Computer/Internet;Group Instruction;Skilled Demonstration;Verbal Instruction;Written Material      Education Topics: Count Your Pulse:  -Group instruction provided by verbal instruction, demonstration, patient participation and written materials to support subject.  Instructors address importance of being able to find your pulse and how to count your pulse when at home without a heart monitor.  Patients get hands on experience counting their pulse with staff help and individually.          CARDIAC REHAB PHASE II EXERCISE from 08/14/2015 in [place]   Date  07/26/15   Instruction Review Code  2- meets goals/outcomes      Heart Attack, Angina, and Risk Factor Modification:  -Group instruction provided by verbal instruction, video, and written materials to support subject.  Instructors address signs and symptoms of angina and heart attacks.    Also discuss risk factors for heart disease and how to make changes to improve heart health risk factors.   Functional Fitness:  -Group instruction provided by verbal instruction, demonstration, patient participation, and written materials to support subject.  Instructors address safety measures for  doing things around the house.  Discuss how to get up and down off the floor, how to pick things up properly, how to safely get out of a chair without assistance, and balance training.      CARDIAC REHAB PHASE II EXERCISE from 08/14/2015 in [place]   Date  08/09/15   Educator  [place]   Instruction Review Code  2- meets goals/outcomes      Meditation and Mindfulness:  -Group instruction provided by verbal instruction, patient participation, and written materials to support subject.  Instructor addresses importance of mindfulness and meditation practice to help reduce stress and improve awareness.  Instructor also leads participants through a meditation exercise.    Stretching for Flexibility and Mobility:  -Group instruction provided by verbal instruction, patient participation, and written materials to support subject.  Instructors lead participants through series of stretches that are designed to increase flexibility thus improving mobility.  These stretches are additional exercise for major muscle groups that are typically performed during regular warm up and cool down.   Hands Only CPR Anytime:  -Group instruction provided by verbal instruction, video, patient participation and written materials to support subject.  Instructors co-teach with AHA video for hands only CPR.  Participants get hands on experience with mannequins.   Nutrition I class: Heart Healthy Eating:  -  Group instruction provided by PowerPoint slides, verbal discussion, and written materials to support subject matter. The instructor gives an explanation and review of the Therapeutic Lifestyle Changes diet recommendations, which includes a discussion on lipid goals, dietary fat, sodium, fiber, plant stanol/sterol esters, sugar, and the components of a well-balanced, healthy diet.   Nutrition II class: Lifestyle Skills:  -Group instruction provided by PowerPoint slides, verbal  discussion, and written materials to support subject matter. The instructor gives an explanation and review of label reading, grocery shopping for heart health, heart healthy recipe modifications, and ways to make healthier choices when eating out.   Diabetes Question & Answer:  -Group instruction provided by PowerPoint slides, verbal discussion, and written materials to support subject matter. The instructor gives an explanation and review of diabetes co-morbidities, pre- and post-prandial blood glucose goals, pre-exercise blood glucose goals, signs, symptoms, and treatment of hypoglycemia and hyperglycemia, and foot care basics.   Diabetes Blitz:  -Group instruction provided by PowerPoint slides, verbal discussion, and written materials to support subject matter. The instructor gives an explanation and review of the physiology behind type 1 and type 2 diabetes, diabetes medications and rational behind using different medications, pre- and post-prandial blood glucose recommendations and Hemoglobin A1c goals, diabetes diet, and exercise including blood glucose guidelines for exercising safely.    Portion Distortion:  -Group instruction provided by PowerPoint slides, verbal discussion, written materials, and food models to support subject matter. The instructor gives an explanation of serving size versus portion size, changes in portions sizes over the last 20 years, and what consists of a serving from each food group.      CARDIAC REHAB PHASE II EXERCISE from 08/14/2015 in [place]   Date  08/14/15   Educator  RD   Instruction Review Code  2- meets goals/outcomes      Stress Management:  -Group instruction provided by verbal instruction, video, and written materials to support subject matter.  Instructors review role of stress in heart disease and how to cope with stress positively.        CARDIAC REHAB PHASE II EXERCISE from 08/14/2015 in [place]   Date  08/07/15   Instruction Review Code  2- meets goals/outcomes      Exercising on Your Own:  -Group instruction provided by verbal instruction, power point, and written materials to support subject.  Instructors discuss benefits of exercise, components of exercise, frequency and intensity of exercise, and end points for exercise.  Also discuss use of nitroglycerin and activating EMS.  Review options of places to exercise outside of rehab.  Review guidelines for sex with heart disease.   Cardiac Drugs I:  -Group instruction provided by verbal instruction and written materials to support subject.  Instructor reviews cardiac drug classes: antiplatelets, anticoagulants, beta blockers, and statins.  Instructor discusses reasons, side effects, and lifestyle considerations for each drug class.      CARDIAC REHAB PHASE II EXERCISE from 08/14/2015 in [place]   Date  07/31/15   Educator  Ph[facility] D   Instruction Review Code  2- meets goals/outcomes      Cardiac Drugs II:  -Group instruction provided by verbal instruction and written materials to support subject.  Instructor reviews cardiac drug classes: angiotensin converting enzyme inhibitors (ACE-I), angiotensin II receptor blockers (ARBs), nitrates, and calcium channel blockers.  Instructor discusses reasons, side effects, and lifestyle considerations for each drug class.  CARDIAC REHAB PHASE II EXERCISE from 08/14/2015 in [place]   Date  07/10/15   Instruction Review Code  2- meets goals/outcomes      Anatomy and Physiology of the Circulatory System:  -Group instruction provided by verbal instruction, video, and written materials to support subject.  Reviews functional anatomy of heart, how it relates to various diagnoses, and what role the heart plays in the overall system.   Knowledge Questionnaire Score:     Knowledge Questionnaire Score -  07/03/15 1259    Knowledge Questionnaire Score   Pre Score 20/24      Core Components/Risk Factors/Patient Goals at Admission:     Personal Goals and Risk Factors at Admission - 08/07/15 1447    Core Components/Risk Factors/Patient Goals on Admission   Expected Outcomes Weight Loss: Understanding of general recommendations for a balanced deficit meal plan, which promotes 1-2 lb weight loss per week and includes a negative energy balance of [phone] kcal/d   Personal Goal continue to work on weightloss, down 2.5 lbs since start of program   Intervention increase physical activity output and decrease caloric intake   Expected Outcomes increased exercise capacity and weigh loss      Core Components/Risk Factors/Patient Goals Review:      Goals and Risk Factor Review      07/26/15 1354 07/26/15 1356 09/04/15 1714       Core Components/Risk Factors/Patient Goals Review   Personal Goals Review Weight Management/Obesity Stress Weight Management/Obesity     Review no weightloss just yet, however gaining muscle mass  finding inner happy place continue to work on weightloss. today's weight is 217, goal is 205     Expected Outcomes continue to monitor weightloss and increase activity levels better stress management and breathing mechanics Able to lose 1 lb per week by way of exercise and nutrtion         Core Components/Risk Factors/Patient Goals at Discharge (Final Review):      Goals and Risk Factor Review - 09/04/15 1714    Core Components/Risk Factors/Patient Goals Review   Personal Goals Review Weight Management/Obesity   Review continue to work on weightloss. today's weight is 217, goal is 205   Expected Outcomes Able to lose 1 lb per week by way of exercise and nutrtion       ITP Comments:     ITP Comments      07/03/15 1309           ITP Comments Medical Director-Dr. Fransico Him, MD          Comments: Pt is making expected progress toward personal goals after  completing 26 sessions. Recommend continued exercise and life style modification education including  stress management and relaxation techniques to decrease cardiac risk profile.

## 2015-09-09 ENCOUNTER — Encounter (HOSPITAL_COMMUNITY)
Admission: RE | Admit: 2015-09-09 | Discharge: 2015-09-09 | Disposition: A | Discharge status: Home / Self Care | Payer: PPO | Source: Ambulatory Visit | Attending: Cardiology | Admitting: Cardiology

## 2015-09-09 ENCOUNTER — Ambulatory Visit (INDEPENDENT_AMBULATORY_CARE_PROVIDER_SITE_OTHER): Payer: PPO | Admitting: Cardiovascular Disease

## 2015-09-09 ENCOUNTER — Encounter: Payer: Self-pay | Admitting: Cardiovascular Disease

## 2015-09-09 VITALS — BP 108/74 | HR 69 | Ht 71.0 in | Wt 219.0 lb

## 2015-09-09 DIAGNOSIS — I2510 Atherosclerotic heart disease of native coronary artery without angina pectoris: Secondary | ICD-10-CM | POA: Diagnosis not present

## 2015-09-09 DIAGNOSIS — I10 Essential (primary) hypertension: Secondary | ICD-10-CM

## 2015-09-09 DIAGNOSIS — I350 Nonrheumatic aortic (valve) stenosis: Secondary | ICD-10-CM

## 2015-09-09 DIAGNOSIS — I214 Non-ST elevation (NSTEMI) myocardial infarction: Secondary | ICD-10-CM | POA: Diagnosis not present

## 2015-09-09 DIAGNOSIS — E7800 Pure hypercholesterolemia, unspecified: Secondary | ICD-10-CM | POA: Diagnosis not present

## 2015-09-09 DIAGNOSIS — Z955 Presence of coronary angioplasty implant and graft: Secondary | ICD-10-CM

## 2015-09-09 DIAGNOSIS — I2583 Coronary atherosclerosis due to lipid rich plaque: Principal | ICD-10-CM

## 2015-09-09 NOTE — Progress Notes (Signed)
Patient ID: Lawrence Hall, male   DOB: 01-26-1946, 69 y.o.   MRN: [id number]    Cardiology Office Note    Date:  09/10/2015   ID:  Lawrence Hall, Lawrence Hall 09/06/1945, MRN [id number]  PCP:  Rachell Cipro, MD  Cardiologist:   Sanda Klein, MD   Chief Complaint  Patient presents with  . Follow-up    History of Present Illness:  Lawrence Hall is a 70 y.o. male returning in follow-up roughly 3 months following a non-ST segment elevation myocardial infarction due to a high-grade stenosis in the proximal left circumflex coronary artery treated with but placement of a drug-eluting stent (3.534 resolute). Additional medical problems include treated prostate cancer, hypertension, hyperlipidemia and obesity..         Post-Intervention Diagram            Implants     Permanent Stent   Stent Resolute Integ 3.5x34 [id number] - Implanted    Inventory item: Stent Resolute Integ 3.5x34 Model/Cat no.: [id number]     He feels great and is just about to graduate from cardiac rehabilitation, planning to continue in the maintenance rehabilitation phase 4. He denies exertional angina or dyspnea and notes that his stamina steadily increasing. He is compliant with dual antiplatelet therapy. He has not had a repeat lipid profile. He has successfully lost quite a bit of weight. He is now 18 pounds lighter than he was at the time of his infarction. He is exercising above and beyond the program at cardiac rehabilitation. 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    Past Medical History  Diagnosis Date  . Prostate cancer ([place]) 03/27/2011    prostate bx=adenocarcinoma,  . Asthma   . Hypercholesterolemia   . Skin cancer   . ED (erectile dysfunction)   . Cataract     early signs b/l eyes  . Arthritis     mild hands  . Skin cancer 2008    basal cell face /removed  . Dysrhythmia     PAC'S  . H/O hiatal hernia   .  Hypertension     MARGINALLY HIGH - NO MEDS -MONITORS  . Sleep apnea     USES NOSTRIL APARATUS - NO C-PAP- "BORDERLINE SLEEP APMNEA"    Past Surgical History  Procedure Laterality Date  . Basal cell cancer  2008    inside nose  . Thyroid surgery  age 18  . Robot assisted laparoscopic radical prostatectomy  08/10/2011    Procedure: ROBOTIC ASSISTED LAPAROSCOPIC RADICAL PROSTATECTOMY LEVEL 2;  Surgeon: Dutch Gray, MD;  Location: [facility];  Service: Urology;  Laterality: N/A;  . Cardiac catheterization N/A 06/03/2015    Procedure: Left Heart Cath and Coronary Angiography;  Surgeon: [name] Sine, MD;  Location: [facility];  Service: Cardiovascular;  Laterality: N/A;    Current Medications: Outpatient Prescriptions Prior to Visit  Medication Sig Dispense Refill  . aspirin EC 81 MG EC tablet Take 1 tablet (81 mg total) by mouth daily. 30 tablet 0  . B Complex-C (SUPER B COMPLEX PO) Take 1 tablet by mouth.    . cholecalciferol (VITAMIN D) 1000 units tablet Take 1,000 Units by mouth daily.    . Coenzyme Q10 (COQ10) 200 MG CAPS Take 200 mg by mouth daily.    [name] MAGNESIUM-ZINC PO Take 1 tablet by mouth.    . metoprolol succinate (TOPROL-XL) 25 MG 24  hr tablet Take 1 tablet (25 mg total) by mouth daily. 30 tablet 11  . nitroGLYCERIN (NITROSTAT) 0.4 MG SL tablet Place 1 tablet (0.4 mg total) under the tongue every 5 (five) minutes as needed for chest pain. 25 tablet 6  . omega-3 acid ethyl esters (LOVAZA) 1 g capsule Take 1 g by mouth daily.    . rosuvastatin (CRESTOR) 20 MG tablet Take 1 tablet (20 mg total) by mouth daily. 30 tablet 6  . ticagrelor (BRILINTA) 90 MG TABS tablet Take 1 tablet (90 mg total) by mouth 2 (two) times daily. 60 tablet 2  . vitamin C (ASCORBIC ACID) 500 MG tablet Take 500 mg by mouth daily.    . vitamin E 400 UNIT capsule Take 400 Units by mouth daily.     No facility-administered medications prior to visit.     Allergies:   Other   Social History   Social  History  . Marital Status: Married    Spouse Name: N/A  . Number of Children: N/A  . Years of Education: N/A   Occupational History  .  [employer]    customer service [employer]   Social History Main Topics  . Smoking status: Never Smoker   . Smokeless tobacco: None  . Alcohol Use: Yes     Comment: Occasional  . Drug Use: No  . Sexual Activity: Not Asked   Other Topics Concern  . None   Social History Narrative     Family History:  The patient's family history includes Breast cancer (age of onset: 95) in his mother; CAD in his brother; Cervical cancer in his sister and sister; Prostate cancer (age of onset: 14) in his father.   ROS:   Please see the history of present illness.    ROS All other systems reviewed and are negative.   PHYSICAL EXAM:   VS:  BP 108/74 mmHg  Pulse 69  Ht 5\' 11"  (1.803 m)  Wt 99.338 kg (219 lb)  BMI 30.56 kg/m2  SpO2 96%   GEN: Well nourished, well developed, in no acute distress HEENT: normal Neck: no JVD, carotid bruits, or masses Cardiac: RRR; 2/6 systolic aortic ejection murmur, no diastolic murmurs, rubs, or gallops,no edema  Respiratory:  clear to auscultation bilaterally, normal work of breathing GI: soft, nontender, nondistended, + BS MS: no deformity or atrophy Skin: warm and dry, no rash Neuro:  Alert and Oriented x 3, Strength and sensation are intact Psych: euthymic mood, full affect  Wt Readings from Last 3 Encounters:  09/09/15 99.338 kg (219 lb)  07/02/15 103.4 kg (227 lb 15.3 oz)  06/12/15 103.874 kg (229 lb)      Studies/Labs Reviewed:   EKG:  EKG is not ordered today.  Recent Labs: 05/31/2015: ALT 21 06/01/2015: TSH 4.147 06/02/2015: Magnesium 2.1 06/04/2015: BUN 18; Creatinine, Ser 1.08; Hemoglobin 13.3; Platelets 212; Potassium 3.7; Sodium 138   Lipid Panel    Component Value Date/Time   CHOL 223* 06/01/2015 1202   TRIG 258* 06/01/2015 1202   HDL 45 06/01/2015 1202   CHOLHDL 5.0 06/01/2015 1202   VLDL  52* 06/01/2015 1202   LDLCALC 126* 06/01/2015 1202     ASSESSMENT:    1. Coronary artery disease s/p NSTEMI/ LCX PCI-DES   2. Essential hypertension   3. Hypercholesterolemia   4. Aortic stenosis      PLAN:  In order of problems listed above:  1. CAD: asymptomatic, exercising, making appropriate lifestyle changes. Discussed target weight loss  goals, diet, etc. Continue DAPT through February 2018 at least. 2. HTN: excellent BP, might need to reduce beta blocker if he loses more weight 3. HLP: recheck lipids 4. AS: not hemodynamically important yet    Medication Adjustments/Labs and Tests Ordered: Current medicines are reviewed at length with the patient today.  Concerns regarding medicines are outlined above.  Medication changes, Labs and Tests ordered today are listed in the Patient Instructions below. Patient Instructions  Your physician recommends that you continue on your current medications as directed. Please refer to the Current Medication list given to you today.  Dr Sallyanne Kuster recommends that you schedule a follow-up appointment in 3 months.  If you need a refill on your cardiac medications before your next appointment, please call your pharmacy.    Signed, Sanda Klein, MD  09/10/2015 2:23 PM    [address], [place], [place]  [address] Phone: [phone]; Fax: [phone]

## 2015-09-09 NOTE — Patient Instructions (Signed)
Your physician recommends that you continue on your current medications as directed. Please refer to the Current Medication list given to you today.  Dr Sallyanne Kuster recommends that you schedule a follow-up appointment in 3 months.  If you need a refill on your cardiac medications before your next appointment, please call your pharmacy.

## 2015-09-11 ENCOUNTER — Encounter (HOSPITAL_COMMUNITY)
Admission: RE | Admit: 2015-09-11 | Discharge: 2015-09-11 | Disposition: A | Discharge status: Home / Self Care | Payer: PPO | Source: Ambulatory Visit | Attending: Cardiology | Admitting: Cardiology

## 2015-09-11 ENCOUNTER — Encounter: Payer: Self-pay | Admitting: Physician Assistant

## 2015-09-11 DIAGNOSIS — Z955 Presence of coronary angioplasty implant and graft: Secondary | ICD-10-CM

## 2015-09-11 DIAGNOSIS — I214 Non-ST elevation (NSTEMI) myocardial infarction: Secondary | ICD-10-CM

## 2015-09-13 ENCOUNTER — Encounter (HOSPITAL_COMMUNITY)
Admission: RE | Admit: 2015-09-13 | Discharge: 2015-09-13 | Disposition: A | Discharge status: Home / Self Care | Payer: PPO | Source: Ambulatory Visit | Attending: Cardiology | Admitting: Cardiology

## 2015-09-13 ENCOUNTER — Telehealth: Payer: Self-pay | Admitting: Cardiovascular Disease

## 2015-09-13 NOTE — Telephone Encounter (Signed)
Spoke with Lawrence Hall, pt arrived at cardiac rehab with a cut on his chin from shaving, it is still oozing blood. The pt is on brilinta and they are calling to let us know. They are going to dress the area and monitor.

## 2015-09-13 NOTE — Telephone Encounter (Signed)
New message   Verdis Frederickson from [facility] Cardiac rehab is calling for triage rn   Pt cut his chin while shaving

## 2015-09-13 NOTE — Progress Notes (Addendum)
Incomplete Session Note  Patient Details  Name: Lawrence Hall MRN: [id number] Date of Birth: [birth year] Referring Provider:        CARDIAC REHAB PHASE II EXERCISE from 07/31/2015 in [facility]   Referring Provider  [name], [place] MD      Lawrence Hall did not complete his rehab session.  Lawrence Hall came to exercise at cardiac with bleeding from a small nick on his chin where he shaved this morning after working outside in the yard for an hour. Lawrence Hall came by prior to his exercise class at 1230. Blood pressure 140/75 heart rate 64. Oxygen saturation 98% on room air. Lawrence Hall reported feeling a little lightheaded while laying down on the treatment table. Telemetry rhythm Sinus 80. Lawrence Booze NP called and notified. Lawrence Hall came to cardiac rehab and held pressure for 10 minutes. Lawrence Hall's bleeding stopped. Lawrence Hall reported no further feelings of lightheaded. Exit blood pressure 124/72 heart rate 64. Lawrence Hall plans to return to exercise on Wednesday.

## 2015-09-16 ENCOUNTER — Encounter (HOSPITAL_COMMUNITY): Payer: PPO

## 2015-09-17 ENCOUNTER — Encounter: Payer: Self-pay | Admitting: Physician Assistant

## 2015-09-17 ENCOUNTER — Telehealth: Payer: Self-pay

## 2015-09-17 NOTE — Telephone Encounter (Signed)
lmtcb

## 2015-09-17 NOTE — Telephone Encounter (Signed)
I was aware of those results, but thought he had repeat studies performed. His LDL cholesterol was too high. Target is less than 70, his was 126. Please confirm that he is now taking his statin and have them repeated.

## 2015-09-17 NOTE — Telephone Encounter (Signed)
Patient returned phone call.  Stated that he sent them through [place] in April.  See Patient Email from 08/09/2015 to Tarri Fuller PA.

## 2015-09-17 NOTE — Telephone Encounter (Signed)
-----   Message from Sanda Klein, MD sent at 09/10/2015  5:10 PM EDT ----- He needs a repeat lipid profile (I'm not sure why I did not order yesterday). Maybe Dr. Ernie Hew was going to do it? No rush and don't care where, but needs in next 3 months please.

## 2015-09-18 ENCOUNTER — Encounter (HOSPITAL_COMMUNITY)
Admission: RE | Admit: 2015-09-18 | Discharge: 2015-09-18 | Disposition: A | Discharge status: Home / Self Care | Payer: PPO | Source: Ambulatory Visit | Attending: Cardiology | Admitting: Cardiology

## 2015-09-18 DIAGNOSIS — I214 Non-ST elevation (NSTEMI) myocardial infarction: Secondary | ICD-10-CM | POA: Diagnosis not present

## 2015-09-18 DIAGNOSIS — Z955 Presence of coronary angioplasty implant and graft: Secondary | ICD-10-CM

## 2015-09-18 NOTE — Telephone Encounter (Signed)
Returned call to patient. There were 2 different sets of blood work from the email he sent back in April. Notified patient that I would request a paper copy from his PCP for Dr C to review. Patient verbalized understanding.

## 2015-09-20 ENCOUNTER — Telehealth: Payer: Self-pay | Admitting: Cardiology

## 2015-09-20 ENCOUNTER — Encounter (HOSPITAL_COMMUNITY)
Admission: RE | Admit: 2015-09-20 | Discharge: 2015-09-20 | Disposition: A | Discharge status: Home / Self Care | Payer: PPO | Source: Ambulatory Visit | Attending: Cardiology | Admitting: Cardiology

## 2015-09-20 DIAGNOSIS — Z955 Presence of coronary angioplasty implant and graft: Secondary | ICD-10-CM | POA: Diagnosis not present

## 2015-09-20 DIAGNOSIS — I214 Non-ST elevation (NSTEMI) myocardial infarction: Secondary | ICD-10-CM | POA: Insufficient documentation

## 2015-09-20 NOTE — Telephone Encounter (Signed)
Cardiac rehab called about ST depression with exercise.  ST did drop though pt asymptomatic.  Reviewed with Dr. Angelena Form.  Will have pt seen next week for eval.

## 2015-09-20 NOTE — Progress Notes (Signed)
Pt developed 59mm ST depression during 6 minute walk test at cardiac rehab. Pt asymptomatic. Cecilie Kicks, NP  made aware.  Strips faxed for review. Verbal order given for pt to schedule office follow up appt in 1-2 weeks.  Pt schedule to see Dr. Recardo Evangelist Monday September 23, 2015 @8 :30.  Written and verbal instructions given. Strips and rehab report faxed for review. Understanding verbalized.

## 2015-09-20 NOTE — Telephone Encounter (Signed)
I reviewed strips also. As long as asymptomatic, no reason to restrict activity

## 2015-09-23 ENCOUNTER — Ambulatory Visit (INDEPENDENT_AMBULATORY_CARE_PROVIDER_SITE_OTHER): Payer: PPO | Admitting: Cardiovascular Disease

## 2015-09-23 ENCOUNTER — Encounter: Payer: Self-pay | Admitting: Cardiovascular Disease

## 2015-09-23 ENCOUNTER — Encounter (HOSPITAL_COMMUNITY)
Admission: RE | Admit: 2015-09-23 | Discharge: 2015-09-23 | Disposition: A | Discharge status: Home / Self Care | Payer: PPO | Source: Ambulatory Visit | Attending: Cardiology | Admitting: Cardiology

## 2015-09-23 VITALS — BP 144/80 | HR 69 | Ht 71.5 in | Wt 220.6 lb

## 2015-09-23 DIAGNOSIS — I10 Essential (primary) hypertension: Secondary | ICD-10-CM

## 2015-09-23 DIAGNOSIS — I214 Non-ST elevation (NSTEMI) myocardial infarction: Secondary | ICD-10-CM

## 2015-09-23 DIAGNOSIS — I25118 Atherosclerotic heart disease of native coronary artery with other forms of angina pectoris: Secondary | ICD-10-CM | POA: Diagnosis not present

## 2015-09-23 DIAGNOSIS — I350 Nonrheumatic aortic (valve) stenosis: Secondary | ICD-10-CM | POA: Diagnosis not present

## 2015-09-23 DIAGNOSIS — E785 Hyperlipidemia, unspecified: Secondary | ICD-10-CM | POA: Diagnosis not present

## 2015-09-23 NOTE — Progress Notes (Signed)
Patient Lawrence: Lawrence Hall, male   DOB: [birth year], 70 y.o.   MRN: [id number] Patient Lawrence: Lawrence Hall, male   DOB: 14-May-1945, 70 y.o.   MRN: [id number]    Cardiology Office Note    Date:  09/24/2015   Lawrence:  Lawrence Hall, DOB 03-Jun-1945, MRN [id number]  PCP:  Rachell Cipro, MD  Cardiologist:   Sanda Klein, MD   Chief Complaint  Patient presents with  . Follow-up    Cardiac Rehab  pt states he was finishing his last exercise test and they told him something did not look right; denies Sx.    History of Present Illness:  Lawrence Hall is a 70 y.o. male is roughly 3 months following a non-ST segment elevation myocardial infarction due to a high-grade stenosis in the proximal left circumflex coronary artery treated with but placement of a drug-eluting stent (3.534 resolute). Additional medical problems include treated prostate cancer, hypertension, hyperlipidemia and obesity.  He also had a 70% stenosis in the mid right coronary artery after medical therapy.  He is referred back after ST segment depression was seen on telemetry during his last session in cardiac rehabilitation. He was really pushing himself. A week earlier he had performed his "exit exercise" and was unhappy with the fact that his performance had not improved from the first day of rehabilitation. He thought this was because he was already tired when he performed his 6 minute walk. So he came back a week later and really pushed himself and pretty much doubled his exercise distance. He did not express angina or dyspnea during that exercise. However, the monitor showed one-2 mm horizontal ST segment depression do not have to stop exercising and his vital signs remained normal. The ST segment returned to baseline relatively quickly.   Follow-up lipid profile shows excellent findings. Triglycerides 120, total cholesterol 113, LDL 47, HDL 42.           Post-Intervention Diagram            Implants     Permanent Stent     Stent Resolute Integ 3.5x34 [id number] - Implanted    Inventory item: Stent Resolute Integ 3.5x34 Model/Cat no.: [id number]         Past Medical History  Diagnosis Date  . Prostate cancer ([place]) 03/27/2011    prostate bx=adenocarcinoma,  . Asthma   . Hypercholesterolemia   . Skin cancer   . ED (erectile dysfunction)   . Cataract     early signs b/l eyes  . Arthritis     mild hands  . Skin cancer 2008    basal cell face /removed  . Dysrhythmia     PAC'S  . H/O hiatal hernia   . Hypertension     MARGINALLY HIGH - NO MEDS -MONITORS  . Sleep apnea     USES NOSTRIL APARATUS - NO C-PAP- "BORDERLINE SLEEP APMNEA"    Past Surgical History  Procedure Laterality Date  . Basal cell cancer  2008    inside nose  . Thyroid surgery  age 19  . Robot assisted laparoscopic radical prostatectomy  08/10/2011    Procedure: ROBOTIC ASSISTED LAPAROSCOPIC RADICAL PROSTATECTOMY LEVEL 2;  Surgeon: Dutch Gray, MD;  Location: [facility];  Service: Urology;  Laterality: N/A;  . Cardiac catheterization N/A 06/03/2015    Procedure: Left Heart Cath and Coronary Angiography;  Surgeon: [name] Sine, MD;  Location: [facility];  Service: Cardiovascular;  Laterality: N/A;    Current Medications:  Outpatient Prescriptions Prior to Visit  Medication Sig Dispense Refill  . aspirin EC 81 MG EC tablet Take 1 tablet (81 mg total) by mouth daily. 30 tablet 0  . B Complex-C (SUPER B COMPLEX PO) Take 1 tablet by mouth.    . cholecalciferol (VITAMIN D) 1000 units tablet Take 1,000 Units by mouth daily.    . Coenzyme Q10 (COQ10) 200 MG CAPS Take 200 mg by mouth daily.    [name] MAGNESIUM-ZINC PO Take 1 tablet by mouth.    . metoprolol succinate (TOPROL-XL) 25 MG 24 hr tablet Take 1 tablet (25 mg total) by mouth daily. 30 tablet 11  . nitroGLYCERIN (NITROSTAT) 0.4 MG SL tablet Place 1 tablet (0.4 mg total) under the tongue every 5 (five) minutes as needed for chest pain. 25 tablet 6  . omega-3 acid ethyl  esters (LOVAZA) 1 g capsule Take 1 g by mouth daily.    . rosuvastatin (CRESTOR) 20 MG tablet Take 1 tablet (20 mg total) by mouth daily. 30 tablet 6  . ticagrelor (BRILINTA) 90 MG TABS tablet Take 1 tablet (90 mg total) by mouth 2 (two) times daily. 60 tablet 2  . vitamin C (ASCORBIC ACID) 500 MG tablet Take 500 mg by mouth daily.    . vitamin E 400 UNIT capsule Take 400 Units by mouth daily.     No facility-administered medications prior to visit.     Allergies:   Other   Social History   Social History  . Marital Status: Married    Spouse Name: N/A  . Number of Children: N/A  . Years of Education: N/A   Occupational History  .  [employer]    customer service [employer]   Social History Main Topics  . Smoking status: Never Smoker   . Smokeless tobacco: None  . Alcohol Use: Yes     Comment: Occasional  . Drug Use: No  . Sexual Activity: Not Asked   Other Topics Concern  . None   Social History Narrative     Family History:  The patient's family history includes Breast cancer (age of onset: 9) in his mother; CAD in his brother; Cervical cancer in his sister and sister; Prostate cancer (age of onset: 55) in his father.   ROS:   Please see the history of present illness.    ROS All other systems reviewed and are negative.   PHYSICAL EXAM:   VS:  BP 144/80 mmHg  Pulse 69  Ht 5' 11.5" (1.816 m)  Wt 100.064 kg (220 lb 9.6 oz)  BMI 30.34 kg/m2   GEN: Well nourished, well developed, in no acute distress HEENT: normal Neck: no JVD, carotid bruits, or masses Cardiac: RRR; 2/6 systolic aortic ejection murmur, no diastolic murmurs, rubs, or gallops,no edema  Respiratory:  clear to auscultation bilaterally, normal work of breathing GI: soft, nontender, nondistended, + BS MS: no deformity or atrophy Skin: warm and dry, no rash Neuro:  Alert and Oriented x 3, Strength and sensation are intact Psych: euthymic mood, full affect  Wt Readings from Last 3 Encounters:    09/23/15 100.064 kg (220 lb 9.6 oz)  09/09/15 99.338 kg (219 lb)  07/02/15 103.4 kg (227 lb 15.3 oz)      Studies/Labs Reviewed:   EKG:  EKG is not ordered today.  Recent Labs: 05/31/2015: ALT 21 06/01/2015: TSH 4.147 06/02/2015: Magnesium 2.1 06/04/2015: BUN 18; Creatinine, Ser 1.08; Hemoglobin 13.3; Platelets 212; Potassium 3.7; Sodium 138   Lipid Panel  Triglycerides 120, total cholesterol 113, LDL 47, HDL 42.  ASSESSMENT:    1. Coronary artery disease involving native coronary artery of native heart with other form of angina pectoris ([place])   2. Essential hypertension   3. Hyperlipidemia   4. Aortic stenosis      PLAN:  In order of problems listed above:  1. CAD: asymptomatic, exercising, making appropriate lifestyle changes. He was really pushing himself on the last of cardiac rehabilitation when he developed ST segment depression. This could well be related to the residual stenosis in the right coronary artery. In the absence of angina pectoris, I'm not sure that revascularization would really be beneficial. I think we'll do a formal reassessment with a nuclear perfusion study roughly one year following his revascularization procedure, by which time problems with restenosis may have already surfaced.  Continue DAPT through February 2018 at least. 2. HTN: Generally good control of BP 3. HLP: excellent lipids on statin 4. AS: not hemodynamically important yet    Medication Adjustments/Labs and Tests Ordered: Current medicines are reviewed at length with the patient today.  Concerns regarding medicines are outlined above.  Medication changes, Labs and Tests ordered today are listed in the Patient Instructions below. Patient Instructions  Please keep your previously scheduled appointment with Dr Sallyanne Kuster.     Signed, Sanda Klein, MD  09/24/2015 5:12 PM    [facility] [place], [place], [place]  [address] Phone: [phone]; Fax: [phone]

## 2015-09-23 NOTE — Patient Instructions (Signed)
Please keep your previously scheduled appointment with Dr Sallyanne Kuster.

## 2015-09-25 ENCOUNTER — Encounter (HOSPITAL_COMMUNITY)
Admission: RE | Admit: 2015-09-25 | Discharge: 2015-09-25 | Disposition: A | Discharge status: Home / Self Care | Payer: PPO | Source: Ambulatory Visit | Attending: Cardiology | Admitting: Cardiology

## 2015-09-25 ENCOUNTER — Telehealth: Payer: Self-pay

## 2015-09-25 DIAGNOSIS — I214 Non-ST elevation (NSTEMI) myocardial infarction: Secondary | ICD-10-CM | POA: Diagnosis not present

## 2015-09-25 DIAGNOSIS — Z955 Presence of coronary angioplasty implant and graft: Secondary | ICD-10-CM

## 2015-09-25 DIAGNOSIS — I25118 Atherosclerotic heart disease of native coronary artery with other forms of angina pectoris: Secondary | ICD-10-CM

## 2015-09-25 NOTE — Telephone Encounter (Signed)
Order placed for Cardiac Rehab.

## 2015-09-27 ENCOUNTER — Encounter (HOSPITAL_COMMUNITY): Payer: PPO

## 2015-09-30 ENCOUNTER — Encounter (HOSPITAL_COMMUNITY)
Admission: RE | Admit: 2015-09-30 | Discharge: 2015-09-30 | Disposition: A | Discharge status: Home / Self Care | Payer: PPO | Source: Ambulatory Visit | Attending: Cardiology | Admitting: Cardiology

## 2015-09-30 DIAGNOSIS — I214 Non-ST elevation (NSTEMI) myocardial infarction: Secondary | ICD-10-CM

## 2015-09-30 DIAGNOSIS — Z955 Presence of coronary angioplasty implant and graft: Secondary | ICD-10-CM

## 2015-10-02 ENCOUNTER — Encounter (HOSPITAL_COMMUNITY)
Admission: RE | Admit: 2015-10-02 | Discharge: 2015-10-02 | Disposition: A | Discharge status: Home / Self Care | Payer: PPO | Source: Ambulatory Visit | Attending: Cardiology | Admitting: Cardiology

## 2015-10-02 DIAGNOSIS — I214 Non-ST elevation (NSTEMI) myocardial infarction: Secondary | ICD-10-CM | POA: Diagnosis not present

## 2015-10-02 DIAGNOSIS — Z955 Presence of coronary angioplasty implant and graft: Secondary | ICD-10-CM

## 2015-10-03 ENCOUNTER — Encounter: Payer: Self-pay | Admitting: Cardiovascular Disease

## 2015-10-04 ENCOUNTER — Encounter (HOSPITAL_COMMUNITY)
Admission: RE | Admit: 2015-10-04 | Discharge: 2015-10-04 | Disposition: A | Discharge status: Home / Self Care | Payer: PPO | Source: Ambulatory Visit | Attending: Cardiology | Admitting: Cardiology

## 2015-10-04 DIAGNOSIS — I214 Non-ST elevation (NSTEMI) myocardial infarction: Secondary | ICD-10-CM | POA: Diagnosis not present

## 2015-10-04 DIAGNOSIS — Z955 Presence of coronary angioplasty implant and graft: Secondary | ICD-10-CM

## 2015-10-07 ENCOUNTER — Encounter (HOSPITAL_COMMUNITY)
Admission: RE | Admit: 2015-10-07 | Discharge: 2015-10-07 | Disposition: A | Discharge status: Home / Self Care | Payer: PPO | Source: Ambulatory Visit | Attending: Cardiology | Admitting: Cardiology

## 2015-10-07 ENCOUNTER — Telehealth (HOSPITAL_COMMUNITY): Payer: Self-pay | Admitting: Family Medicine

## 2015-10-07 ENCOUNTER — Encounter (HOSPITAL_COMMUNITY): Payer: Self-pay

## 2015-10-07 ENCOUNTER — Encounter (HOSPITAL_COMMUNITY): Payer: PPO

## 2015-10-07 VITALS — Ht 71.0 in | Wt 218.9 lb

## 2015-10-07 DIAGNOSIS — I213 ST elevation (STEMI) myocardial infarction of unspecified site: Secondary | ICD-10-CM

## 2015-10-07 DIAGNOSIS — Z955 Presence of coronary angioplasty implant and graft: Secondary | ICD-10-CM

## 2015-10-07 DIAGNOSIS — I214 Non-ST elevation (NSTEMI) myocardial infarction: Secondary | ICD-10-CM | POA: Diagnosis not present

## 2015-10-07 NOTE — Progress Notes (Signed)
Discharge Summary  Patient Details  Name: Lawrence Hall MRN: [id number] Date of Birth: Nov 11, 1945 Referring Provider:        CARDIAC REHAB PHASE II EXERCISE from 07/31/2015 in [place]   Referring Provider  [name], [place] MD       Number of Visits: 71  Reason for Discharge:  Patient independent in their exercise.  Smoking History:  History  Smoking status  . Never Smoker   Smokeless tobacco  . Not on file    Diagnosis:  ST elevation (STEMI) myocardial infarction of unspecified site [facility])  S/P coronary artery stent placement  ADL UCSD:   Initial Exercise Prescription:     Initial Exercise Prescription - 08/01/15 1400    Date of Initial Exercise RX and Referring Provider   Date 07/02/15   Referring Provider [name], [name]   Treadmill   MPH 3.2   Grade 3   Minutes 10   METs 4.7   Bike   Level 1.7   Minutes 10   METs 4.1   NuStep   Level 3   Minutes 10   METs 4.3   Resistance Training   Training Prescription No   Weight --  Progressive muscle relaxation and medititation      Discharge Exercise Prescription (Final Exercise Prescription Changes):     Exercise Prescription Changes - 10/03/15 1600    Exercise Review   Progression Yes   Response to Exercise   Blood Pressure (Admit) 138/80 mmHg   Blood Pressure (Exercise) 122/80 mmHg   Blood Pressure (Exit) 118/70 mmHg   Heart Rate (Admit) 72 bpm   Heart Rate (Exercise) 127 bpm   Heart Rate (Exit) 82 bpm   Rating of Perceived Exertion (Exercise) 12   Duration Progress to 30 minutes of continuous aerobic without signs/symptoms of physical distress   Intensity THRR unchanged   Progression   Progression Continue to progress workloads to maintain intensity without signs/symptoms of physical distress.   Average METs 5.8   Resistance Training   Training Prescription Yes   Weight 5lbs   Reps 10-12   Treadmill   MPH 3.5   Grade 5   Minutes 10   METs 6.09   Bike    Level 1.7   Minutes 10   METs 4.24   Rower   Level 3   Watts 55   Minutes 10   METs 5.5   Home Exercise Plan   Plans to continue exercise at Home  [facility]   Frequency Add 3 additional days to program exercise sessions.  reviewed on 07/26/15      Functional Capacity:     6 Minute Walk      07/02/15 1626       6 Minute Walk   Phase Initial     Distance 2049 feet     Walk Time 6 minutes     # of Rest Breaks 0     MPH 3.88     METS 4.11     RPE 13     VO2 Peak 14.38     Symptoms No     Resting HR 73 bpm     Resting BP 120/80 mmHg     Max Ex. HR 107 bpm     Max Ex. BP 142/74 mmHg        Psychological, QOL, Others - Outcomes: PHQ 2/9: Depression screen [facility] 2/9 10/07/2015 07/08/2015  Decreased Interest 0 0  Down, Depressed, Hopeless 0 0  PHQ - 2 Score 0 0    Quality of Life:     Quality of Life - 07/03/15 1259    Quality of Life Scores   Health/Function Pre 21.43 %   Socioeconomic Pre 18.44 %   Psych/Spiritual Pre 22.71 %   Family Pre 20.1 %   GLOBAL Pre 20.81 %      Personal Goals: Goals established at orientation with interventions provided to work toward goal.     Personal Goals and Risk Factors at Admission - 08/07/15 1447    Core Components/Risk Factors/Patient Goals on Admission   Expected Outcomes Weight Loss: Understanding of general recommendations for a balanced deficit meal plan, which promotes 1-2 lb weight loss per week and includes a negative energy balance of [id number] kcal/d   Personal Goal continue to work on weightloss, down 2.5 lbs since start of program   Intervention increase physical activity output and decrease caloric intake   Expected Outcomes increased exercise capacity and weigh loss       Personal Goals Discharge:     Goals and Risk Factor Review      07/26/15 1354 07/26/15 1356 09/04/15 1714 10/03/15 1624     Core Components/Risk Factors/Patient Goals Review   Personal Goals Review Weight  Management/Obesity Stress Weight Management/Obesity Other    Review no weightloss just yet, however gaining muscle mass  finding inner happy place continue to work on weightloss. today's weight is 217, goal is 205 pt is riding road bike 3-4 miles, 3-4x/week    Expected Outcomes continue to monitor weightloss and increase activity levels better stress management and breathing mechanics Able to lose 1 lb per week by way of exercise and nutrtion  pt will continue with home exercise routine without complications       Nutrition & Weight - Outcomes:     Pre Biometrics - 07/02/15 1630    Pre Biometrics   Waist Circumference 43 inches   Hip Circumference 41.25 inches   Waist to Hip Ratio 1.04 %   Triceps Skinfold 18 mm   % Body Fat 30.9 %   Grip Strength 35 kg   Flexibility 7 in   Single Leg Stand 19.74 seconds       Nutrition:     Nutrition Therapy & Goals - 07/03/15 1513    Nutrition Therapy   Diet Therapeutic Lifestyle Changes   Personal Nutrition Goals   Personal Goal #1 6-24 lb (2.7-10.9 kg) wt loss at graduation from [place] Goal #2 Long term weight loss goal weight 189 lb   Intervention Plan   Intervention Prescribe, educate and counsel regarding individualized specific dietary modifications aiming towards targeted core components such as weight, hypertension, lipid management, diabetes, heart failure and other comorbidities.   Expected Outcomes Short Term Goal: Understand basic principles of dietary content, such as calories, fat, sodium, cholesterol and nutrients.;Long Term Goal: Adherence to prescribed nutrition plan.      Nutrition Discharge:     Nutrition Assessments - 07/22/15 1421    MEDFICTS Scores   Pre Score 32      Education Questionnaire Score:     Knowledge Questionnaire Score - 07/03/15 1259    Knowledge Questionnaire Score   Pre Score 20/24      Goals reviewed with patient; copy given to patient. Pt is congratulated on his  weight loss which totals 20 pounds since his event. Pt plans to continue weight loss efforts striving for 200  pound weight goal.  Pt plans to exercise on his own until he joins the cardiac maintenance program.  Pt also plans to volunteer in cardiac rehab program.

## 2015-10-09 ENCOUNTER — Encounter (HOSPITAL_COMMUNITY): Payer: PPO

## 2015-10-11 ENCOUNTER — Encounter (HOSPITAL_COMMUNITY): Payer: PPO

## 2015-10-18 NOTE — Telephone Encounter (Signed)
Fax signed referral to cardiac rehab on 10/08/15.

## 2015-10-24 ENCOUNTER — Encounter: Payer: Self-pay | Admitting: Cardiovascular Disease

## 2015-11-15 DIAGNOSIS — Z79899 Other long term (current) drug therapy: Secondary | ICD-10-CM | POA: Diagnosis not present

## 2015-11-15 DIAGNOSIS — I10 Essential (primary) hypertension: Secondary | ICD-10-CM | POA: Diagnosis not present

## 2015-11-15 DIAGNOSIS — Z23 Encounter for immunization: Secondary | ICD-10-CM | POA: Diagnosis not present

## 2015-11-15 DIAGNOSIS — E782 Mixed hyperlipidemia: Secondary | ICD-10-CM | POA: Diagnosis not present

## 2015-11-15 DIAGNOSIS — I214 Non-ST elevation (NSTEMI) myocardial infarction: Secondary | ICD-10-CM | POA: Diagnosis not present

## 2015-11-15 DIAGNOSIS — E559 Vitamin D deficiency, unspecified: Secondary | ICD-10-CM | POA: Diagnosis not present

## 2015-12-11 ENCOUNTER — Ambulatory Visit (INDEPENDENT_AMBULATORY_CARE_PROVIDER_SITE_OTHER): Payer: PPO | Admitting: Cardiovascular Disease

## 2015-12-11 ENCOUNTER — Encounter: Payer: Self-pay | Admitting: Cardiovascular Disease

## 2015-12-11 VITALS — BP 112/63 | HR 72 | Ht 71.0 in | Wt 217.0 lb

## 2015-12-11 DIAGNOSIS — I350 Nonrheumatic aortic (valve) stenosis: Secondary | ICD-10-CM

## 2015-12-11 DIAGNOSIS — I10 Essential (primary) hypertension: Secondary | ICD-10-CM | POA: Diagnosis not present

## 2015-12-11 DIAGNOSIS — I25118 Atherosclerotic heart disease of native coronary artery with other forms of angina pectoris: Secondary | ICD-10-CM

## 2015-12-11 DIAGNOSIS — E785 Hyperlipidemia, unspecified: Secondary | ICD-10-CM | POA: Diagnosis not present

## 2015-12-11 NOTE — Patient Instructions (Addendum)
Dr [name] recommends that you schedule a follow-up appointment in 6 months. You will receive a reminder letter in the mail two months in advance. If you don't receive a letter, please call our office to schedule the follow-up appointment.  If you need a refill on your cardiac medications before your next appointment, please call your pharmacy. 

## 2015-12-11 NOTE — Progress Notes (Signed)
Patient ID: Lawrence Hall, male   DOB: 1945-05-06, 70 y.o.   MRN: [id number] Patient ID: Lawrence Hall, male   DOB: 02/14/1946, 70 y.o.   MRN: [id number]    Cardiology Office Note    Date:  12/13/2015   ID:  Lawrence Hall November 12, 1945, MRN [id number]  PCP:  Rachell Cipro, MD  Cardiologist:   Sanda Klein, MD   Chief Complaint  Patient presents with  . Follow-up    pt c/o bruising    History of Present Illness:  Lawrence Hall is a 70 y.o. male with hyperlipidemia and mild to moderate aortic stenosis, CAD now roughly 6 months following a non-ST segment elevation myocardial infarction due to a high-grade stenosis in the proximal left circumflex coronary artery treated with but placement of a drug-eluting stent (3.534 Resolute). Additional medical problems include treated prostate cancer, hypertension, hyperlipidemia and obesity.  He also has an asymptomatic 70% stenosis in the mid right coronary artery left for medical therapy.  He has been having few financial difficulties and is yet to re-enroll in phase for cardiac rehabilitation. He remains physically active and denies angina, dyspnea, edema, syncope, palpitations, claudication or other complaints. He remains overweight, borderline obese.  Follow-up lipid profile shows excellent findings. Triglycerides 120, total cholesterol 113, LDL 47, HDL 42.           Post-Intervention Diagram            Implants     Permanent Stent   Stent Resolute Integ 3.5x34 [id number] - Implanted    Inventory item: Stent Resolute Integ 3.5x34 Model/Cat no.: [id number]         Past Medical History:  Diagnosis Date  . Arthritis    mild hands  . Asthma   . Cataract    early signs b/l eyes  . Dysrhythmia    PAC'S  . ED (erectile dysfunction)   . H/O hiatal hernia   . Hypercholesterolemia   . Hypertension    MARGINALLY HIGH - NO MEDS -MONITORS  . Prostate cancer ([place]) 03/27/2011   prostate bx=adenocarcinoma,  . Skin cancer    . Skin cancer 2008   basal cell face /removed  . Sleep apnea    USES NOSTRIL APARATUS - NO C-PAP- "BORDERLINE SLEEP APMNEA"    Past Surgical History:  Procedure Laterality Date  . basal cell cancer  2008   inside nose  . CARDIAC CATHETERIZATION N/A 06/03/2015   Procedure: Left Heart Cath and Coronary Angiography;  Surgeon: [name] Sine, MD;  Location: [place] [facility];  Service: Cardiovascular;  Laterality: N/A;  . ROBOT ASSISTED LAPAROSCOPIC RADICAL PROSTATECTOMY  08/10/2011   Procedure: ROBOTIC ASSISTED LAPAROSCOPIC RADICAL PROSTATECTOMY LEVEL 2;  Surgeon: Dutch Gray, MD;  Location: [facility];  Service: Urology;  Laterality: N/A;  . THYROID SURGERY  age [age]    Current Medications: Outpatient Medications Prior to Visit  Medication Sig Dispense Refill  . aspirin EC 81 MG EC tablet Take 1 tablet (81 mg total) by mouth daily. 30 tablet 0  . B Complex-C (SUPER B COMPLEX PO) Take 1 tablet by mouth.    . cholecalciferol (VITAMIN D) 1000 units tablet Take 1,000 Units by mouth daily.    . Coenzyme Q10 (COQ10) 200 MG CAPS Take 200 mg by mouth daily.    [name] MAGNESIUM-ZINC PO Take 1 tablet by mouth.    . metoprolol succinate (TOPROL-XL) 25 MG 24 hr tablet Take 1 tablet (25 mg total) by mouth daily. 30 tablet  11  . nitroGLYCERIN (NITROSTAT) 0.4 MG SL tablet Place 1 tablet (0.4 mg total) under the tongue every 5 (five) minutes as needed for chest pain. 25 tablet 6  . omega-3 acid ethyl esters (LOVAZA) 1 g capsule Take 1 g by mouth daily.    . rosuvastatin (CRESTOR) 20 MG tablet Take 1 tablet (20 mg total) by mouth daily. 30 tablet 6  . ticagrelor (BRILINTA) 90 MG TABS tablet Take 1 tablet (90 mg total) by mouth 2 (two) times daily. 60 tablet 2  . vitamin C (ASCORBIC ACID) 500 MG tablet Take 500 mg by mouth daily.    . vitamin E 400 UNIT capsule Take 400 Units by mouth daily.     No facility-administered medications prior to visit.      Allergies:   Other   Social History   Social History    . Marital status: Married    Spouse name: N/A  . Number of children: N/A  . Years of education: N/A   Occupational History  .  [employer]    customer service [employer]   Social History Main Topics  . Smoking status: Never Smoker  . Smokeless tobacco: None  . Alcohol use Yes     Comment: Occasional  . Drug use: No  . Sexual activity: Not Asked   Other Topics Concern  . None   Social History Narrative  . None     Family History:  The patient's family history includes Breast cancer (age of onset: 4) in his mother; CAD in his brother; Cervical cancer in his sister and sister; Prostate cancer (age of onset: 106) in his father.   ROS:   Please see the history of present illness.    ROS All other systems reviewed and are negative.   PHYSICAL EXAM:   VS:  BP 112/63 (BP Location: Right Arm, Patient Position: Sitting, Cuff Size: Normal)   Pulse 72   Ht 5\' 11"  (1.803 m)   Wt 217 lb (98.4 kg)   SpO2 97%   BMI 30.27 kg/m    GEN: Well nourished, well developed, in no acute distress  HEENT: normal  Neck: no JVD, carotid bruits, or masses Cardiac: RRR; 2/6 systolic aortic ejection murmur, no diastolic murmurs, rubs, or gallops,no edema  Respiratory:  clear to auscultation bilaterally, normal work of breathing GI: soft, nontender, nondistended, + BS MS: no deformity or atrophy  Skin: warm and dry, no rash Neuro:  Alert and Oriented x 3, Strength and sensation are intact Psych: euthymic mood, full affect  Wt Readings from Last 3 Encounters:  12/11/15 217 lb (98.4 kg)  10/18/15 218 lb 14.7 oz (99.3 kg)  09/23/15 220 lb 9.6 oz (100.1 kg)      Studies/Labs Reviewed:   EKG:  EKG is not ordered today.  Recent Labs: 05/31/2015: ALT 21 06/01/2015: TSH 4.147 06/02/2015: Magnesium 2.1 06/04/2015: BUN 18; Creatinine, Ser 1.08; Hemoglobin 13.3; Platelets 212; Potassium 3.7; Sodium 138   Lipid Panel Triglycerides 120, total cholesterol 113, LDL 47, HDL 42.  ASSESSMENT:     1. Coronary artery disease involving native coronary artery of native heart with other form of angina pectoris ([place])   2. Essential hypertension   3. Hyperlipidemia   4. Aortic stenosis      PLAN:  In order of problems listed above:  1. CAD: asymptomatic, exercising. He has an asymptomatic 70% right coronary artery stenosis for which we'll do a formal reassessment with a nuclear perfusion study roughly one  year following his revascularization procedure, by which time any problems with LCX restenosis should have also occurred.  Continue DAPT through February 2018 at least. 2. HTN: Generally good control of BP 3. HLP: excellent lipids on statin. Recommend weight loss. 4. AS: not hemodynamically important yet    Medication Adjustments/Labs and Tests Ordered: Current medicines are reviewed at length with the patient today.  Concerns regarding medicines are outlined above.  Medication changes, Labs and Tests ordered today are listed in the Patient Instructions below. Patient Instructions  Dr Sallyanne Kuster recommends that you schedule a follow-up appointment in 6 months. You will receive a reminder letter in the mail two months in advance. If you don't receive a letter, please call our office to schedule the follow-up appointment.  If you need a refill on your cardiac medications before your next appointment, please call your pharmacy.    Signed, Sanda Klein, MD  12/13/2015 4:24 PM    [address] [place], [place], [place]  [address] Phone: [phone]; Fax: [phone]

## 2015-12-18 ENCOUNTER — Encounter: Payer: Self-pay | Admitting: Cardiovascular Disease

## 2015-12-30 ENCOUNTER — Other Ambulatory Visit: Payer: Self-pay | Admitting: Cardiovascular Disease

## 2015-12-30 DIAGNOSIS — E785 Hyperlipidemia, unspecified: Secondary | ICD-10-CM

## 2016-02-06 DIAGNOSIS — H6123 Impacted cerumen, bilateral: Secondary | ICD-10-CM | POA: Diagnosis not present

## 2016-02-06 DIAGNOSIS — Z6830 Body mass index (BMI) 30.0-30.9, adult: Secondary | ICD-10-CM | POA: Diagnosis not present

## 2016-02-06 DIAGNOSIS — Z23 Encounter for immunization: Secondary | ICD-10-CM | POA: Diagnosis not present

## 2016-02-06 DIAGNOSIS — I214 Non-ST elevation (NSTEMI) myocardial infarction: Secondary | ICD-10-CM | POA: Diagnosis not present

## 2016-02-06 DIAGNOSIS — Z0000 Encounter for general adult medical examination without abnormal findings: Secondary | ICD-10-CM | POA: Diagnosis not present

## 2016-02-06 DIAGNOSIS — Z1211 Encounter for screening for malignant neoplasm of colon: Secondary | ICD-10-CM | POA: Diagnosis not present

## 2016-02-06 DIAGNOSIS — I10 Essential (primary) hypertension: Secondary | ICD-10-CM | POA: Diagnosis not present

## 2016-02-06 DIAGNOSIS — E782 Mixed hyperlipidemia: Secondary | ICD-10-CM | POA: Diagnosis not present

## 2016-02-07 ENCOUNTER — Encounter: Payer: Self-pay | Admitting: Cardiovascular Disease

## 2016-03-03 ENCOUNTER — Encounter: Payer: Self-pay | Admitting: Cardiovascular Disease

## 2016-03-03 ENCOUNTER — Encounter: Payer: Self-pay | Admitting: Family Medicine

## 2016-03-06 ENCOUNTER — Encounter: Payer: Self-pay | Admitting: Cardiovascular Disease

## 2016-06-15 NOTE — Progress Notes (Signed)
Patient ID: Lawrence Hall, male   DOB: 12-19-1945, 71 y.o.   MRN: [id number] Patient ID: Lawrence Hall, male   DOB: 11/10/1945, 71 y.o.   MRN: [id number]    Cardiology Office Note    Date:  06/16/2016   ID:  Hall, Lawrence 19-Jul-1945, MRN [id number]  PCP:  Rachell Cipro, MD  Cardiologist:   Sanda Klein, MD   No chief complaint on file.   History of Present Illness:  Lawrence Hall is a 71 y.o. male with hyperlipidemia and mild to moderate aortic stenosis, CAD now roughly one year following a non-ST segment elevation myocardial infarction due to a high-grade stenosis in the proximal left circumflex coronary artery treated with placement of a drug-eluting stent (3.534 Resolute). Additional medical problems include treated prostate cancer, hypertension, hyperlipidemia and obesity.  He also has an asymptomatic 70% stenosis in the mid right coronary artery left for medical therapy.  He remains physically active and denies angina, dyspnea, edema, syncope, palpitations, claudication or other complaints. He remains overweight, borderline obese.           Post-Intervention Diagram            Implants     Permanent Stent   Stent Resolute Integ 3.5x34 [id number] - Implanted    Inventory item: Stent Resolute Integ 3.5x34 Model/Cat no.: [id number]         Past Medical History:  Diagnosis Date  . Arthritis    mild hands  . Asthma   . Cataract    early signs b/l eyes  . Dysrhythmia    PAC'S  . ED (erectile dysfunction)   . H/O hiatal hernia   . Hypercholesterolemia   . Hypertension    MARGINALLY HIGH - NO MEDS -MONITORS  . Prostate cancer ([place]) 03/27/2011   prostate bx=adenocarcinoma,  . Skin cancer   . Skin cancer 2008   basal cell face /removed  . Sleep apnea    USES NOSTRIL APARATUS - NO C-PAP- "BORDERLINE SLEEP APMNEA"    Past Surgical History:  Procedure Laterality Date  . basal cell cancer  2008   inside nose  . CARDIAC CATHETERIZATION N/A  06/03/2015   Procedure: Left Heart Cath and Coronary Angiography;  Surgeon: [name] Sine, MD;  Location: [place] [facility];  Service: Cardiovascular;  Laterality: N/A;  . ROBOT ASSISTED LAPAROSCOPIC RADICAL PROSTATECTOMY  08/10/2011   Procedure: ROBOTIC ASSISTED LAPAROSCOPIC RADICAL PROSTATECTOMY LEVEL 2;  Surgeon: Dutch Gray, MD;  Location: [facility];  Service: Urology;  Laterality: N/A;  . THYROID SURGERY  age [age]    Current Medications: Outpatient Medications Prior to Visit  Medication Sig Dispense Refill  . aspirin EC 81 MG EC tablet Take 1 tablet (81 mg total) by mouth daily. 30 tablet 0  . B Complex-C (SUPER B COMPLEX PO) Take 1 tablet by mouth.    . cholecalciferol (VITAMIN D) 1000 units tablet Take 1,000 Units by mouth daily.    . Coenzyme Q10 (COQ10) 200 MG CAPS Take 200 mg by mouth daily.    [name] MAGNESIUM-ZINC PO Take 1 tablet by mouth.    . metoprolol succinate (TOPROL-XL) 25 MG 24 hr tablet Take 1 tablet (25 mg total) by mouth daily. 30 tablet 11  . nitroGLYCERIN (NITROSTAT) 0.4 MG SL tablet Place 1 tablet (0.4 mg total) under the tongue every 5 (five) minutes as needed for chest pain. 25 tablet 6  . omega-3 acid ethyl esters (LOVAZA) 1 g capsule Take 1 g by mouth  daily.    . rosuvastatin (CRESTOR) 20 MG tablet Take 1 tablet (20 mg total) by mouth daily. 90 tablet 3  . ticagrelor (BRILINTA) 90 MG TABS tablet Take 1 tablet (90 mg total) by mouth 2 (two) times daily. 60 tablet 2  . vitamin C (ASCORBIC ACID) 500 MG tablet Take 500 mg by mouth daily.    . vitamin E 400 UNIT capsule Take 400 Units by mouth daily.     No facility-administered medications prior to visit.      Allergies:   Other   Social History   Social History  . Marital status: Married    Spouse name: N/A  . Number of children: N/A  . Years of education: N/A   Occupational History  .  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 Smoking status: Never Smoker  . Smokeless  tobacco: Not on file  . Alcohol use Yes     Comment: Occasional  . Drug use: No  . Sexual activity: Not on file   Other Topics Concern  . Not on file   Social History Narrative  . No narrative on file     Family History:  The patient's family history includes Breast cancer (age of onset: 7) in his mother; CAD in his brother; Cervical cancer in his sister and sister; Prostate cancer (age of onset: 58) in his father.   ROS:   Please see the history of present illness.    ROS All other systems reviewed and are negative.   PHYSICAL EXAM:   VS:  BP 136/81   Pulse 78   Ht 5\' 11"  (1.803 m)   Wt 104.3 kg (230 lb)   BMI 32.08 kg/m    GEN: Well nourished, well developed, in no acute distress  HEENT: normal  Neck: no JVD, carotid bruits, or masses Cardiac: RRR; 2/6 systolic aortic ejection murmur, no diastolic murmurs, rubs, or gallops,no edema  Respiratory:  clear to auscultation bilaterally, normal work of breathing GI: soft, nontender, nondistended, + BS MS: no deformity or atrophy  Skin: warm and dry, no rash Neuro:  Alert and Oriented x 3, Strength and sensation are intact Psych: euthymic mood, full affect  Wt Readings from Last 3 Encounters:  06/16/16 104.3 kg (230 lb)  12/11/15 98.4 kg (217 lb)  10/18/15 99.3 kg (218 lb 14.7 oz)      Studies/Labs Reviewed:   EKG:  EKG is  ordered today. Normal sinus rhythm, normal tracing  Lipid Panel Triglycerides 120, total cholesterol 113, LDL 47, HDL 42.  ASSESSMENT:    1. Coronary artery disease involving native coronary artery of native heart with other form of angina pectoris ([place])   2. Essential hypertension   3. Hypercholesterolemia   4. Nonrheumatic aortic valve stenosis   5. Medication management      PLAN:  In order of problems listed above:  1. CAD: asymptomatic, exercising. He has an asymptomatic 70% right coronary artery stenosis. Discussed evaluation with a stress test, but since he is doing so well  clinically, will defer unless he develops symptoms. Stop Brilinta, continue ASA.  2. HTN: Generally good control of BP 3. HLP: excellent lipids on statin. Recheck before his next appointment Recommend weight loss. 4. AS: not hemodynamically important yet    Medication Adjustments/Labs and Tests Ordered: Current medicines are reviewed at length with the patient today.  Concerns regarding medicines are outlined above.  Medication changes, Labs and Tests  ordered today are listed in the Patient Instructions below. Patient Instructions  Medication Instructions: Dr Sallyanne Kuster recommends that you continue on your current medications as directed. Please refer to the Current Medication list given to you today.  Labwork: Your physician recommends that you return for lab work in 6 months - FASTING.  Testing/Procedures: NONE ORDERED  Follow-up: Dr Sallyanne Kuster recommends that you schedule a follow-up appointment in 6 months. You will receive a reminder letter in the mail two months in advance. If you don't receive a letter, please call our office to schedule the follow-up appointment.  If you need a refill on your cardiac medications before your next appointment, please call your pharmacy.    Signed, Sanda Klein, MD  06/16/2016 1:36 PM    [facility] [place], [place],   [address] Phone: [phone]; Fax: [phone]

## 2016-06-16 ENCOUNTER — Ambulatory Visit (INDEPENDENT_AMBULATORY_CARE_PROVIDER_SITE_OTHER): Payer: PPO | Admitting: Cardiovascular Disease

## 2016-06-16 ENCOUNTER — Encounter: Payer: Self-pay | Admitting: Cardiovascular Disease

## 2016-06-16 VITALS — BP 136/81 | HR 78 | Ht 71.0 in | Wt 230.0 lb

## 2016-06-16 DIAGNOSIS — I350 Nonrheumatic aortic (valve) stenosis: Secondary | ICD-10-CM | POA: Diagnosis not present

## 2016-06-16 DIAGNOSIS — I25118 Atherosclerotic heart disease of native coronary artery with other forms of angina pectoris: Secondary | ICD-10-CM | POA: Diagnosis not present

## 2016-06-16 DIAGNOSIS — I10 Essential (primary) hypertension: Secondary | ICD-10-CM | POA: Diagnosis not present

## 2016-06-16 DIAGNOSIS — Z79899 Other long term (current) drug therapy: Secondary | ICD-10-CM | POA: Diagnosis not present

## 2016-06-16 DIAGNOSIS — E7800 Pure hypercholesterolemia, unspecified: Secondary | ICD-10-CM

## 2016-06-16 NOTE — Patient Instructions (Signed)
Medication Instructions: Dr Sallyanne Kuster recommends that you continue on your current medications as directed. Please refer to the Current Medication list given to you today.  Labwork: Your physician recommends that you return for lab work in 6 months - FASTING.  Testing/Procedures: NONE ORDERED  Follow-up: Dr Sallyanne Kuster recommends that you schedule a follow-up appointment in 6 months. You will receive a reminder letter in the mail two months in advance. If you don't receive a letter, please call our office to schedule the follow-up appointment.  If you need a refill on your cardiac medications before your next appointment, please call your pharmacy.

## 2016-06-18 NOTE — Addendum Note (Signed)
Addended by: Milderd Meager on: 06/18/2016 03:11 PM   Modules accepted: Orders

## 2016-06-28 ENCOUNTER — Other Ambulatory Visit: Payer: Self-pay | Admitting: Cardiovascular Disease

## 2016-07-26 ENCOUNTER — Emergency Department (HOSPITAL_COMMUNITY): Payer: PPO

## 2016-07-26 ENCOUNTER — Encounter (HOSPITAL_COMMUNITY): Payer: Self-pay | Admitting: *Deleted

## 2016-07-26 DIAGNOSIS — J189 Pneumonia, unspecified organism: Secondary | ICD-10-CM | POA: Diagnosis not present

## 2016-07-26 DIAGNOSIS — Z85828 Personal history of other malignant neoplasm of skin: Secondary | ICD-10-CM | POA: Insufficient documentation

## 2016-07-26 DIAGNOSIS — Z7982 Long term (current) use of aspirin: Secondary | ICD-10-CM | POA: Insufficient documentation

## 2016-07-26 DIAGNOSIS — R05 Cough: Secondary | ICD-10-CM | POA: Diagnosis not present

## 2016-07-26 DIAGNOSIS — Z79899 Other long term (current) drug therapy: Secondary | ICD-10-CM | POA: Insufficient documentation

## 2016-07-26 DIAGNOSIS — R5383 Other fatigue: Secondary | ICD-10-CM | POA: Diagnosis not present

## 2016-07-26 DIAGNOSIS — I2510 Atherosclerotic heart disease of native coronary artery without angina pectoris: Secondary | ICD-10-CM | POA: Insufficient documentation

## 2016-07-26 DIAGNOSIS — J111 Influenza due to unidentified influenza virus with other respiratory manifestations: Secondary | ICD-10-CM | POA: Diagnosis not present

## 2016-07-26 DIAGNOSIS — I10 Essential (primary) hypertension: Secondary | ICD-10-CM | POA: Insufficient documentation

## 2016-07-26 DIAGNOSIS — J45909 Unspecified asthma, uncomplicated: Secondary | ICD-10-CM | POA: Insufficient documentation

## 2016-07-26 DIAGNOSIS — Z8546 Personal history of malignant neoplasm of prostate: Secondary | ICD-10-CM | POA: Insufficient documentation

## 2016-07-26 DIAGNOSIS — I252 Old myocardial infarction: Secondary | ICD-10-CM | POA: Insufficient documentation

## 2016-07-26 DIAGNOSIS — R110 Nausea: Secondary | ICD-10-CM | POA: Diagnosis not present

## 2016-07-26 DIAGNOSIS — J181 Lobar pneumonia, unspecified organism: Secondary | ICD-10-CM | POA: Insufficient documentation

## 2016-07-26 LAB — COMPREHENSIVE METABOLIC PANEL
ALBUMIN: 3.7 g/dL (ref 3.5–5.0)
ALT: 25 U/L (ref 17–63)
AST: 26 U/L (ref 15–41)
Alkaline Phosphatase: 46 U/L (ref 38–126)
Anion gap: 11 (ref 5–15)
BILIRUBIN TOTAL: 0.8 mg/dL (ref 0.3–1.2)
BUN: 19 mg/dL (ref 6–20)
CHLORIDE: 103 mmol/L (ref 101–111)
CO2: 20 mmol/L — ABNORMAL LOW (ref 22–32)
Calcium: 8.6 mg/dL — ABNORMAL LOW (ref 8.9–10.3)
Creatinine, Ser: 1.03 mg/dL (ref 0.61–1.24)
GFR calc Af Amer: >60 mL/min (ref >60)
GFR calc non Af Amer: >60 mL/min (ref >60)
GLUCOSE: 138 mg/dL — AB (ref 65–99)
POTASSIUM: 4 mmol/L (ref 3.5–5.1)
Sodium: 134 mmol/L — ABNORMAL LOW (ref 135–145)
TOTAL PROTEIN: 7.3 g/dL (ref 6.5–8.1)

## 2016-07-26 LAB — URINALYSIS, ROUTINE W REFLEX MICROSCOPIC
BILIRUBIN URINE: NEGATIVE
Glucose, UA: NEGATIVE mg/dL
HGB URINE DIPSTICK: NEGATIVE
KETONES UR: NEGATIVE mg/dL
Leukocytes, UA: NEGATIVE
Nitrite: NEGATIVE
PH: 6 (ref 5.0–8.0)
Protein, ur: NEGATIVE mg/dL
Specific Gravity, Urine: 1.02 (ref 1.005–1.030)

## 2016-07-26 LAB — CBC
HEMATOCRIT: 39.4 % (ref 39.0–52.0)
Hemoglobin: 13.6 g/dL (ref 13.0–17.0)
MCH: 28.9 pg (ref 26.0–34.0)
MCHC: 34.5 g/dL (ref 30.0–36.0)
MCV: 83.8 fL (ref 78.0–100.0)
Platelets: 158 10*3/uL (ref 150–400)
RBC: 4.7 MIL/uL (ref 4.22–5.81)
RDW: 13.4 % (ref 11.5–15.5)
WBC: 7.2 10*3/uL (ref 4.0–10.5)

## 2016-07-26 LAB — I-STAT CG4 LACTIC ACID, ED: Lactic Acid, Venous: 1.34 mmol/L (ref 0.5–1.9)

## 2016-07-26 MED ORDER — ACETAMINOPHEN 500 MG PO TABS
1000.0000 mg | ORAL_TABLET | Freq: Once | ORAL | Status: DC
  Filled 2016-07-26: qty 2

## 2016-07-26 NOTE — ED Notes (Signed)
Last tylenol 4 hours ago

## 2016-07-26 NOTE — ED Triage Notes (Signed)
The pt thinks he has the flu for 3-4 days body aches n v  Cough   His nausea has been worse for 24 hours  And he has no appetite his nausea is not going  away

## 2016-07-26 NOTE — ED Notes (Signed)
Unable to hold med down nauseated

## 2016-07-27 ENCOUNTER — Emergency Department (HOSPITAL_COMMUNITY)
Admission: EM | Admit: 2016-07-27 | Discharge: 2016-07-27 | Disposition: A | Discharge status: Home / Self Care | Payer: PPO | Attending: Emergency Medicine | Admitting: Emergency Medicine

## 2016-07-27 DIAGNOSIS — J181 Lobar pneumonia, unspecified organism: Secondary | ICD-10-CM

## 2016-07-27 DIAGNOSIS — R6889 Other general symptoms and signs: Secondary | ICD-10-CM

## 2016-07-27 DIAGNOSIS — J189 Pneumonia, unspecified organism: Secondary | ICD-10-CM

## 2016-07-27 LAB — I-STAT CG4 LACTIC ACID, ED: LACTIC ACID, VENOUS: 1.85 mmol/L (ref 0.5–1.9)

## 2016-07-27 MED ORDER — LEVOFLOXACIN 750 MG PO TABS
750.0000 mg | ORAL_TABLET | Freq: Once | ORAL | Status: AC
  Administered 2016-07-27: 750 mg via ORAL
  Filled 2016-07-27: qty 1

## 2016-07-27 MED ORDER — BENZONATATE 100 MG PO CAPS: 100.0000 mg | ORAL_CAPSULE | Freq: Three times a day (TID) | ORAL | 0 refills | Status: DC | PRN

## 2016-07-27 MED ORDER — ONDANSETRON 4 MG PO TBDP
8.0000 mg | ORAL_TABLET | Freq: Once | ORAL | Status: AC
  Administered 2016-07-27: 8 mg via ORAL
  Filled 2016-07-27: qty 2

## 2016-07-27 MED ORDER — ONDANSETRON 4 MG PO TBDP: 4.0000 mg | ORAL_TABLET | Freq: Three times a day (TID) | ORAL | 0 refills | Status: DC | PRN

## 2016-07-27 MED ORDER — OSELTAMIVIR PHOSPHATE 75 MG PO CAPS: 75.0000 mg | ORAL_CAPSULE | Freq: Two times a day (BID) | ORAL | 0 refills | Status: DC

## 2016-07-27 MED ORDER — SODIUM CHLORIDE 0.9 % IV BOLUS (SEPSIS)
1000.0000 mL | Freq: Once | INTRAVENOUS | Status: AC
  Administered 2016-07-27: 1000 mL via INTRAVENOUS

## 2016-07-27 MED ORDER — ONDANSETRON HCL 4 MG/2ML IJ SOLN
4.0000 mg | Freq: Once | INTRAMUSCULAR | Status: AC
  Administered 2016-07-27: 4 mg via INTRAVENOUS
  Filled 2016-07-27: qty 2

## 2016-07-27 MED ORDER — LEVOFLOXACIN 750 MG PO TABS: 750.0000 mg | ORAL_TABLET | Freq: Every day | ORAL | 0 refills | Status: DC

## 2016-07-27 MED ORDER — BENZONATATE 100 MG PO CAPS
100.0000 mg | ORAL_CAPSULE | Freq: Three times a day (TID) | ORAL | Status: DC | PRN
  Administered 2016-07-27: 100 mg via ORAL
  Filled 2016-07-27: qty 1

## 2016-07-27 NOTE — ED Provider Notes (Signed)
[facility] DEPT Provider Note   CSN: [id number] Arrival date & time: 07/26/16  2226   By signing my name below, I, Delton Prairie, attest that this documentation has been prepared under the direction and in the presence of Merryl Hacker, MD  Electronically Signed: Delton Prairie, ED Scribe. 07/27/16. 2:42 AM.   History   Chief Complaint Chief Complaint  Patient presents with  . Generalized Body Aches    HPI Comments:  Lawrence Hall is a 71 y.o. male, with a PMHx of HTN on metoprolol, hyperlipidemia, CAD and NSTEMI, who presents to the Emergency Department complaining of acute onset, persistent, generalized body aches x 3 days. Pt also reports a cough, nausea, dry heaving, fever (tmax 101.4) and chest pressure. No alleviating or aggravating factors noted. Pt denies vomiting, diarrhea, SOB, abdominal pain or any other associated symptoms. Pt states he did have his flu shot this year. No other complaints noted.   The history is provided by the patient. No language interpreter was used.    Past Medical History:  Diagnosis Date  . Arthritis    mild hands  . Asthma   . Cataract    early signs b/l eyes  . Dysrhythmia    PAC'S  . ED (erectile dysfunction)   . H/O hiatal hernia   . Hypercholesterolemia   . Hypertension    MARGINALLY HIGH - NO MEDS -MONITORS  . Prostate cancer ([place]) 03/27/2011   prostate bx=adenocarcinoma,  . Skin cancer   . Skin cancer 2008   basal cell face /removed  . Sleep apnea    USES NOSTRIL APARATUS - NO C-PAP- "BORDERLINE SLEEP APMNEA"    Patient Active Problem List   Diagnosis Date Noted  . CAD (coronary artery disease) 06/12/2015  . NSTEMI (non-ST elevated myocardial infarction) ([place])   . Aortic stenosis   . Non-STEMI (non-ST elevated myocardial infarction) ([place])   . Essential hypertension 06/01/2015  . Asthma 05/31/2015  . Elevated troponin 05/31/2015  . Chest pain 07/25/2012  . Arthritis   . Hyperlipidemia   . Hypercholesterolemia   .  Skin cancer   . ED (erectile dysfunction)   . Prostate cancer ([place]) 03/27/2011    Past Surgical History:  Procedure Laterality Date  . basal cell cancer  2008   inside nose  . CARDIAC CATHETERIZATION N/A 06/03/2015   Procedure: Left Heart Cath and Coronary Angiography;  Surgeon: [name] Sine, MD;  Location: [facility];  Service: Cardiovascular;  Laterality: N/A;  . ROBOT ASSISTED LAPAROSCOPIC RADICAL PROSTATECTOMY  08/10/2011   Procedure: ROBOTIC ASSISTED LAPAROSCOPIC RADICAL PROSTATECTOMY LEVEL 2;  Surgeon: Dutch Gray, MD;  Location: [facility];  Service: Urology;  Laterality: N/A;  . THYROID SURGERY  age [age]       Home Medications    Prior to Admission medications   Medication Sig Start Date End Date Taking? Authorizing Provider  aspirin EC 81 MG EC tablet Take 1 tablet (81 mg total) by mouth daily. 06/04/15  Yes Florencia Reasons, MD  B Complex-C (SUPER B COMPLEX PO) Take 1 tablet by mouth.   Yes Historical Provider, MD  cholecalciferol (VITAMIN D) 1000 units tablet Take 1,000 Units by mouth daily.   Yes Historical Provider, MD  Coenzyme Q10 (COQ10) 200 MG CAPS Take 200 mg by mouth daily.   Yes Historical Provider, MD  MAGNESIUM-ZINC PO Take 1 tablet by mouth.   Yes Historical Provider, MD  metoprolol succinate (TOPROL-XL) 25 MG 24 hr tablet TAKE 1 TABLET ONCE DAILY.  06/29/16  Yes Mihai [name], MD  nitroGLYCERIN (NITROSTAT) 0.4 MG SL tablet Place 1 tablet (0.4 mg total) under the tongue every 5 (five) minutes as needed for chest pain. 06/05/15  Yes Mihai [name], MD  omega-3 acid ethyl esters (LOVAZA) 1 g capsule Take 1 g by mouth daily.   Yes Historical Provider, MD  rosuvastatin (CRESTOR) 20 MG tablet Take 1 tablet (20 mg total) by mouth daily. 12/30/15  Yes Mihai [name], MD  vitamin C (ASCORBIC ACID) 500 MG tablet Take 500 mg by mouth daily.   Yes Historical Provider, MD  vitamin E 400 UNIT capsule Take 400 Units by mouth daily.   Yes Historical Provider, MD  benzonatate  (TESSALON) 100 MG capsule Take 1 capsule (100 mg total) by mouth 3 (three) times daily as needed for cough. 07/27/16   Merryl Hacker, MD  levofloxacin (LEVAQUIN) 750 MG tablet Take 1 tablet (750 mg total) by mouth daily. 07/27/16   Merryl Hacker, MD  ondansetron (ZOFRAN ODT) 4 MG disintegrating tablet Take 1 tablet (4 mg total) by mouth every 8 (eight) hours as needed for nausea or vomiting. 07/27/16   Merryl Hacker, MD  oseltamivir (TAMIFLU) 75 MG capsule Take 1 capsule (75 mg total) by mouth every 12 (twelve) hours. 07/27/16   Merryl Hacker, MD  ticagrelor (BRILINTA) 90 MG TABS tablet Take 1 tablet (90 mg total) by mouth 2 (two) times daily. Patient not taking: Reported on 07/27/2016 07/02/15   Sanda Klein, MD    Family History Family History  Problem Relation Age of Onset  . Prostate cancer Father 73    in his 70's/other comorbidities/79 deceased  . Breast cancer Mother 34    mets to bone,deceased age 32  . Cervical cancer Sister     2 sisters  . Cervical cancer Sister   . CAD Brother     Died of sudden cardiac death/MI    Social History Social History  Substance Use Topics  . Smoking status: Never Smoker  . Smokeless tobacco: Never Used  . Alcohol use Yes     Comment: Occasional     Allergies   Other   Review of Systems Review of Systems  Constitutional: Positive for fever.  Respiratory: Positive for cough. Negative for shortness of breath.   Cardiovascular: Negative for chest pain and leg swelling.  Gastrointestinal: Positive for nausea. Negative for abdominal pain, diarrhea and vomiting.  Musculoskeletal: Positive for myalgias.  All other systems reviewed and are negative.    Physical Exam Updated Vital Signs BP 129/83   Pulse 94   Temp 99.2 F (37.3 C) (Oral)   Resp (!) 22   Ht 5' 11.5" (1.816 m)   Wt 233 lb 9 oz (105.9 kg)   SpO2 95%   BMI 32.12 kg/m   Physical Exam  Constitutional: He is oriented to person, place, and time. He appears  well-developed and well-nourished.  Appears younger than stated age, ill-appearing but nontoxic  HENT:  Head: Normocephalic and atraumatic.  Cardiovascular: Normal rate, regular rhythm and normal heart sounds.   No murmur heard. Pulmonary/Chest: Effort normal. No respiratory distress. He has no wheezes.  Scattered rhonchi  Abdominal: Soft. Bowel sounds are normal. There is no tenderness. There is no rebound.  Musculoskeletal: He exhibits no edema.  Neurological: He is alert and oriented to person, place, and time.  Skin: Skin is warm and dry.  Psychiatric: He has a normal mood and affect.  Nursing note and vitals reviewed.  ED Treatments / Results  DIAGNOSTIC STUDIES:  Oxygen Saturation is 96% on RA, normal by my interpretation.    COORDINATION OF CARE:  2:41 AM Discussed treatment plan with pt at bedside and pt agreed to plan.  Labs (all labs ordered are listed, but only abnormal results are displayed) Labs Reviewed  COMPREHENSIVE METABOLIC PANEL - Abnormal; Notable for the following:       Result Value   Sodium 134 (*)    CO2 20 (*)    Glucose, Bld 138 (*)    Calcium 8.6 (*)    All other components within normal limits  CULTURE, BLOOD (ROUTINE X 2)  CULTURE, BLOOD (ROUTINE X 2)  CBC  URINALYSIS, ROUTINE W REFLEX MICROSCOPIC  I-STAT CG4 LACTIC ACID, ED  I-STAT CG4 LACTIC ACID, ED    EKG  EKG Interpretation  Date/Time:  Monday July 27 2016 02:50:06 EDT Ventricular Rate:  90 PR Interval:    QRS Duration: 85 QT Interval:  352 QTC Calculation: 431 R Axis:   -146 Text Interpretation:  Right and left arm electrode reversal, interpretation assumes no reversal Sinus rhythm Borderline prolonged PR interval Right axis deviation Abnormal R-wave progression, early transition Nonspecific T abnormalities, lateral leads Confirmed by Dina Rich  MD, COURTNEY ([id number]) on 07/27/2016 2:51:58 AM       Radiology Dg Chest 2 View  Result Date: 07/26/2016 CLINICAL DATA:  Body aches  and cough. EXAM: CHEST  2 VIEW COMPARISON:  05/31/2015 chest radiograph FINDINGS: Normal cardiac silhouette. Aortic atherosclerosis with calcification. Streaky opacities in the left lung base. No pleural effusion or pneumothorax. Bones are unremarkable. IMPRESSION: Streaky opacities in the left lung bases probably represents bronchitic changes, possibly with developing atelectasis or pneumonia. Electronically Signed   By: Kristine Garbe M.D.   On: 07/26/2016 23:54    Procedures Procedures (including critical care time)  Medications Ordered in ED Medications  acetaminophen (TYLENOL) tablet 1,000 mg (1,000 mg Oral Not Given 07/27/16 0011)  benzonatate (TESSALON) capsule 100 mg (not administered)  ondansetron (ZOFRAN-ODT) disintegrating tablet 8 mg (8 mg Oral Given 07/27/16 0005)  levofloxacin (LEVAQUIN) tablet 750 mg (750 mg Oral Given 07/27/16 0414)  sodium chloride 0.9 % bolus 1,000 mL (0 mLs Intravenous Stopped 07/27/16 0416)  ondansetron (ZOFRAN) injection 4 mg (4 mg Intravenous Given 07/27/16 0300)     Initial Impression / Assessment and Plan / ED Course  I have reviewed the triage vital signs and the nursing notes.  Pertinent labs & imaging results that were available during my care of the patient were reviewed by me and considered in my medical decision making (see chart for details).     Patient presents with flulike symptoms and cough. Onset of symptoms on Friday. He is nontoxic. Afebrile. Initial vital signs reassuring. Lab work reviewed from triage. Largely reassuring including no leukocytosis. Normal lactate. Chest x-ray does show possible early left lower lobe infiltrate. Given symptoms, would elect to treat. Patient given fluids and Levaquin. Given additional flulike symptoms, would also offer to treat for flu empirically.  On recheck, patient reports some improvement of symptoms. He is able to ambulate and maintain his pulse ox at 95%. He is ambulatory independently. He is  tolerating fluids. Feel he can be discharged home safely. Will prescribe antibiotics and supportive measures.  After history, exam, and medical workup I feel the patient has been appropriately medically screened and is safe for discharge home. Pertinent diagnoses were discussed with the patient. Patient was given return precautions.   Final Clinical  Impressions(s) / ED Diagnoses   Final diagnoses:  Flu-like symptoms  Community acquired pneumonia of left lower lobe of lung (HCC)    New Prescriptions New Prescriptions   BENZONATATE (TESSALON) 100 MG CAPSULE    Take 1 capsule (100 mg total) by mouth 3 (three) times daily as needed for cough.   LEVOFLOXACIN (LEVAQUIN) 750 MG TABLET    Take 1 tablet (750 mg total) by mouth daily.   ONDANSETRON (ZOFRAN ODT) 4 MG DISINTEGRATING TABLET    Take 1 tablet (4 mg total) by mouth every 8 (eight) hours as needed for nausea or vomiting.   OSELTAMIVIR (TAMIFLU) 75 MG CAPSULE    Take 1 capsule (75 mg total) by mouth every 12 (twelve) hours.   I personally performed the services described in this documentation, which was scribed in my presence. The recorded information has been reviewed and is accurate.    Merryl Hacker, MD 07/27/16 [phone]

## 2016-07-27 NOTE — Discharge Instructions (Signed)
You were seen today for cough and flulike symptoms. He will be started empirically on Tamiflu. He will also be given Levaquin for likely pneumonia on your chest x-ray. Zofran as needed for nausea. Make sure to hydrate aggressively at home.

## 2016-07-27 NOTE — ED Notes (Signed)
Pt SPO2 stayed between 94-96 while ambulating.

## 2016-07-27 NOTE — ED Notes (Signed)
Pt given food per MD

## 2016-08-01 LAB — CULTURE, BLOOD (ROUTINE X 2)
CULTURE: NO GROWTH
Culture: NO GROWTH
SPECIAL REQUESTS: ADEQUATE
SPECIAL REQUESTS: ADEQUATE

## 2016-08-03 DIAGNOSIS — J189 Pneumonia, unspecified organism: Secondary | ICD-10-CM | POA: Diagnosis not present

## 2016-08-03 DIAGNOSIS — I10 Essential (primary) hypertension: Secondary | ICD-10-CM | POA: Diagnosis not present

## 2016-08-03 DIAGNOSIS — Z6830 Body mass index (BMI) 30.0-30.9, adult: Secondary | ICD-10-CM | POA: Diagnosis not present

## 2016-08-03 DIAGNOSIS — I2510 Atherosclerotic heart disease of native coronary artery without angina pectoris: Secondary | ICD-10-CM | POA: Diagnosis not present

## 2016-10-07 DIAGNOSIS — E559 Vitamin D deficiency, unspecified: Secondary | ICD-10-CM | POA: Diagnosis not present

## 2016-10-13 DIAGNOSIS — J189 Pneumonia, unspecified organism: Secondary | ICD-10-CM | POA: Diagnosis not present

## 2016-10-13 DIAGNOSIS — Z6831 Body mass index (BMI) 31.0-31.9, adult: Secondary | ICD-10-CM | POA: Diagnosis not present

## 2016-10-13 DIAGNOSIS — E559 Vitamin D deficiency, unspecified: Secondary | ICD-10-CM | POA: Diagnosis not present

## 2016-10-13 DIAGNOSIS — I10 Essential (primary) hypertension: Secondary | ICD-10-CM | POA: Diagnosis not present

## 2016-10-17 ENCOUNTER — Encounter: Payer: Self-pay | Admitting: Cardiovascular Disease

## 2016-12-28 ENCOUNTER — Other Ambulatory Visit: Payer: Self-pay | Admitting: Cardiovascular Disease

## 2016-12-28 DIAGNOSIS — E785 Hyperlipidemia, unspecified: Secondary | ICD-10-CM

## 2016-12-28 NOTE — Telephone Encounter (Signed)
Rx(s) sent to pharmacy electronically.  

## 2017-01-06 ENCOUNTER — Other Ambulatory Visit: Payer: Self-pay

## 2017-01-06 ENCOUNTER — Other Ambulatory Visit: Payer: Self-pay | Admitting: Cardiovascular Disease

## 2017-01-06 MED ORDER — NITROGLYCERIN 0.4 MG SL SUBL: SUBLINGUAL_TABLET | SUBLINGUAL | Status: DC

## 2017-01-06 MED ORDER — NITROGLYCERIN 0.4 MG SL SUBL: SUBLINGUAL_TABLET | SUBLINGUAL | 3 refills | Status: DC

## 2017-02-11 DIAGNOSIS — I214 Non-ST elevation (NSTEMI) myocardial infarction: Secondary | ICD-10-CM | POA: Diagnosis not present

## 2017-02-11 DIAGNOSIS — Z6832 Body mass index (BMI) 32.0-32.9, adult: Secondary | ICD-10-CM | POA: Diagnosis not present

## 2017-02-11 DIAGNOSIS — I10 Essential (primary) hypertension: Secondary | ICD-10-CM | POA: Diagnosis not present

## 2017-02-11 DIAGNOSIS — E782 Mixed hyperlipidemia: Secondary | ICD-10-CM | POA: Diagnosis not present

## 2017-02-11 DIAGNOSIS — Z1211 Encounter for screening for malignant neoplasm of colon: Secondary | ICD-10-CM | POA: Diagnosis not present

## 2017-02-11 DIAGNOSIS — Z0000 Encounter for general adult medical examination without abnormal findings: Secondary | ICD-10-CM | POA: Diagnosis not present

## 2017-04-15 DIAGNOSIS — E559 Vitamin D deficiency, unspecified: Secondary | ICD-10-CM | POA: Diagnosis not present

## 2017-04-15 DIAGNOSIS — E782 Mixed hyperlipidemia: Secondary | ICD-10-CM | POA: Diagnosis not present

## 2017-04-15 DIAGNOSIS — I214 Non-ST elevation (NSTEMI) myocardial infarction: Secondary | ICD-10-CM | POA: Diagnosis not present

## 2017-04-15 DIAGNOSIS — I10 Essential (primary) hypertension: Secondary | ICD-10-CM | POA: Diagnosis not present

## 2017-05-13 ENCOUNTER — Other Ambulatory Visit: Payer: Self-pay | Admitting: Cardiovascular Disease

## 2017-05-13 DIAGNOSIS — E785 Hyperlipidemia, unspecified: Secondary | ICD-10-CM

## 2017-05-13 DIAGNOSIS — Z0000 Encounter for general adult medical examination without abnormal findings: Secondary | ICD-10-CM | POA: Diagnosis not present

## 2017-05-13 DIAGNOSIS — I2510 Atherosclerotic heart disease of native coronary artery without angina pectoris: Secondary | ICD-10-CM | POA: Diagnosis not present

## 2017-05-13 DIAGNOSIS — I10 Essential (primary) hypertension: Secondary | ICD-10-CM | POA: Diagnosis not present

## 2017-05-13 DIAGNOSIS — R5383 Other fatigue: Secondary | ICD-10-CM | POA: Diagnosis not present

## 2017-05-13 DIAGNOSIS — Z6833 Body mass index (BMI) 33.0-33.9, adult: Secondary | ICD-10-CM | POA: Diagnosis not present

## 2017-06-10 DIAGNOSIS — R5383 Other fatigue: Secondary | ICD-10-CM | POA: Diagnosis not present

## 2017-06-10 DIAGNOSIS — I10 Essential (primary) hypertension: Secondary | ICD-10-CM | POA: Diagnosis not present

## 2017-06-10 DIAGNOSIS — R7303 Prediabetes: Secondary | ICD-10-CM | POA: Diagnosis not present

## 2017-08-16 DIAGNOSIS — E782 Mixed hyperlipidemia: Secondary | ICD-10-CM | POA: Diagnosis not present

## 2017-08-16 DIAGNOSIS — I10 Essential (primary) hypertension: Secondary | ICD-10-CM | POA: Diagnosis not present

## 2017-08-18 DIAGNOSIS — I214 Non-ST elevation (NSTEMI) myocardial infarction: Secondary | ICD-10-CM | POA: Diagnosis not present

## 2017-08-18 DIAGNOSIS — I2510 Atherosclerotic heart disease of native coronary artery without angina pectoris: Secondary | ICD-10-CM | POA: Diagnosis not present

## 2017-08-18 DIAGNOSIS — I10 Essential (primary) hypertension: Secondary | ICD-10-CM | POA: Diagnosis not present

## 2017-08-18 DIAGNOSIS — E782 Mixed hyperlipidemia: Secondary | ICD-10-CM | POA: Diagnosis not present

## 2017-09-09 DIAGNOSIS — S81832A Puncture wound without foreign body, left lower leg, initial encounter: Secondary | ICD-10-CM | POA: Diagnosis not present

## 2017-09-09 DIAGNOSIS — Z6831 Body mass index (BMI) 31.0-31.9, adult: Secondary | ICD-10-CM | POA: Diagnosis not present

## 2017-11-22 DIAGNOSIS — R5383 Other fatigue: Secondary | ICD-10-CM | POA: Diagnosis not present

## 2017-11-22 DIAGNOSIS — E782 Mixed hyperlipidemia: Secondary | ICD-10-CM | POA: Diagnosis not present

## 2017-11-22 DIAGNOSIS — R7303 Prediabetes: Secondary | ICD-10-CM | POA: Diagnosis not present

## 2017-11-22 DIAGNOSIS — I10 Essential (primary) hypertension: Secondary | ICD-10-CM | POA: Diagnosis not present

## 2017-11-22 DIAGNOSIS — E559 Vitamin D deficiency, unspecified: Secondary | ICD-10-CM | POA: Diagnosis not present

## 2017-11-24 DIAGNOSIS — R7303 Prediabetes: Secondary | ICD-10-CM | POA: Diagnosis not present

## 2017-11-24 DIAGNOSIS — Z6831 Body mass index (BMI) 31.0-31.9, adult: Secondary | ICD-10-CM | POA: Diagnosis not present

## 2017-11-24 DIAGNOSIS — E782 Mixed hyperlipidemia: Secondary | ICD-10-CM | POA: Diagnosis not present

## 2017-11-24 DIAGNOSIS — I2510 Atherosclerotic heart disease of native coronary artery without angina pectoris: Secondary | ICD-10-CM | POA: Diagnosis not present

## 2017-11-24 DIAGNOSIS — I10 Essential (primary) hypertension: Secondary | ICD-10-CM | POA: Diagnosis not present

## 2018-02-14 DIAGNOSIS — Z125 Encounter for screening for malignant neoplasm of prostate: Secondary | ICD-10-CM | POA: Diagnosis not present

## 2018-03-26 ENCOUNTER — Other Ambulatory Visit: Payer: Self-pay | Admitting: Cardiovascular Disease

## 2018-03-26 DIAGNOSIS — E785 Hyperlipidemia, unspecified: Secondary | ICD-10-CM

## 2018-03-31 ENCOUNTER — Ambulatory Visit (INDEPENDENT_AMBULATORY_CARE_PROVIDER_SITE_OTHER): Payer: PPO | Admitting: Cardiovascular Disease

## 2018-03-31 ENCOUNTER — Encounter: Payer: Self-pay | Admitting: Cardiovascular Disease

## 2018-03-31 VITALS — BP 110/61 | HR 64 | Ht 71.5 in | Wt 227.0 lb

## 2018-03-31 DIAGNOSIS — I350 Nonrheumatic aortic (valve) stenosis: Secondary | ICD-10-CM | POA: Diagnosis not present

## 2018-03-31 DIAGNOSIS — I10 Essential (primary) hypertension: Secondary | ICD-10-CM | POA: Diagnosis not present

## 2018-03-31 DIAGNOSIS — R011 Cardiac murmur, unspecified: Secondary | ICD-10-CM

## 2018-03-31 DIAGNOSIS — E7800 Pure hypercholesterolemia, unspecified: Secondary | ICD-10-CM

## 2018-03-31 DIAGNOSIS — R0602 Shortness of breath: Secondary | ICD-10-CM | POA: Diagnosis not present

## 2018-03-31 DIAGNOSIS — I25118 Atherosclerotic heart disease of native coronary artery with other forms of angina pectoris: Secondary | ICD-10-CM | POA: Diagnosis not present

## 2018-03-31 MED ORDER — ICOSAPENT ETHYL 1 G PO CAPS: 2.0000 g | ORAL_CAPSULE | Freq: Two times a day (BID) | ORAL | 3 refills | Status: DC

## 2018-03-31 NOTE — Progress Notes (Signed)
Patient ID: Lawrence Hall, male   DOB: 1945-08-06, 72 y.o.   MRN: [id number] Patient ID: Lawrence Hall, male   DOB: 1945-07-08, 72 y.o.   MRN: [id number]    Cardiology Office Note    Date:  04/01/2018   ID:  Lawrence Hall, DOB 11/28/1945, MRN [id number]  PCP:  Fanny Bien, MD  Cardiologist:   Sanda Klein, MD   Chief Complaint  Patient presents with  . Follow-up    6 months.    History of Present Illness:  Lawrence Hall is a 72 y.o. male with hyperlipidemia and mild to moderate aortic stenosis, CAD now almost 3 years following a non-ST segment elevation myocardial infarction due to a high-grade stenosis in the proximal left circumflex coronary artery treated with placement of a drug-eluting stent (3.534 Resolute). Additional medical problems include treated prostate cancer, hypertension, hyperlipidemia and obesity.  He also has an asymptomatic 70% stenosis in the mid right coronary artery left for medical therapy.  However, he cannot keep up with his wife when walking in the neighborhood, especially difficult to climb hills due to dyspnea.  Occasionally this also leads to a mild sensation of tightness in his chest.  The symptoms promptly resolve within a couple of minutes of rest at the top of the hill.  The patient specifically denies any chest pain at rest or usual exertion, dyspnea at rest or with usual exertion, orthopnea, paroxysmal nocturnal dyspnea, syncope, palpitations, focal neurological deficits, intermittent claudication, lower extremity edema, unexplained weight gain, cough, hemoptysis or wheezing.  Clearly what bothers him the most is frequency urgency and occasional incontinence related to his previous prostate surgery.  This stops him from exercising and he limits his intake of fluids, sometimes to the point of dehydration.  His LDL cholesterol was slightly higher than desirable and Dr. Ernie Hew added another 5 mg of rosuvastatin and just recently his repeat LDL cholesterol  was 69.          Post-Intervention Diagram            Implants     Permanent Stent   Stent Resolute Integ 3.5x34 - [id number] - Implanted    Inventory item: Stent Resolute Integ 3.5x34 Model/Cat no.: [id number]         Past Medical History:  Diagnosis Date  . Arthritis    mild hands  . Asthma   . Cataract    early signs b/l eyes  . Dysrhythmia    PAC'S  . ED (erectile dysfunction)   . H/O hiatal hernia   . Hypercholesterolemia   . Hypertension    MARGINALLY HIGH - NO MEDS -MONITORS  . Prostate cancer ([place]) 03/27/2011   prostate bx=adenocarcinoma,  . Skin cancer   . Skin cancer 2008   basal cell face /removed  . Sleep apnea    USES NOSTRIL APARATUS - NO C-PAP- "BORDERLINE SLEEP APMNEA"    Past Surgical History:  Procedure Laterality Date  . basal cell cancer  2008   inside nose  . CARDIAC CATHETERIZATION N/A 06/03/2015   Procedure: Left Heart Cath and Coronary Angiography;  Surgeon: [name] Sine, MD;  Location: [facility];  Service: Cardiovascular;  Laterality: N/A;  . ROBOT ASSISTED LAPAROSCOPIC RADICAL PROSTATECTOMY  08/10/2011   Procedure: ROBOTIC ASSISTED LAPAROSCOPIC RADICAL PROSTATECTOMY LEVEL 2;  Surgeon: Dutch Gray, MD;  Location: [facility];  Service: Urology;  Laterality: N/A;  . THYROID SURGERY  age [age]    Current Medications: Outpatient Medications Prior to  Visit  Medication Sig Dispense Refill  . aspirin EC 81 MG EC tablet Take 1 tablet (81 mg total) by mouth daily. 30 tablet 0  . benzonatate (TESSALON) 100 MG capsule Take 1 capsule (100 mg total) by mouth 3 (three) times daily as needed for cough. 21 capsule 0  . Cholecalciferol (VITAMIN D-3) 125 MCG (5000 UT) TABS Take 5,000 Units by mouth daily.    . Coenzyme Q10 (COQ10) 200 MG CAPS Take 200 mg by mouth daily.    [name] MAGNESIUM-ZINC PO Take 1 tablet by mouth.    . metoprolol succinate (TOPROL-XL) 25 MG 24 hr tablet TAKE 1 TABLET ONCE DAILY. 30 tablet 10  . nitroGLYCERIN  (NITROSTAT) 0.4 MG SL tablet 1 TAB UNDER TONGUE AS NEEDED FOR CHEST PAIN. MAY REPEAT EVERY 5 MIN FOR A TOTAL OF 3 DOSES. 25 tablet 3  . rosuvastatin (CRESTOR) 20 MG tablet TAKE 1 TABLET ONCE DAILY. 90 tablet 2  . rosuvastatin (CRESTOR) 5 MG tablet Take 5 mg by mouth daily.    . vitamin E 400 UNIT capsule Take 400 Units by mouth daily.    . B Complex-C (SUPER B COMPLEX PO) Take 1 tablet by mouth.    . cholecalciferol (VITAMIN D) 1000 units tablet Take 1,000 Units by mouth daily.    [name] levofloxacin (LEVAQUIN) 750 MG tablet Take 1 tablet (750 mg total) by mouth daily. 4 tablet 0  . omega-3 acid ethyl esters (LOVAZA) 1 g capsule Take 1 g by mouth daily.    . ondansetron (ZOFRAN ODT) 4 MG disintegrating tablet Take 1 tablet (4 mg total) by mouth every 8 (eight) hours as needed for nausea or vomiting. 20 tablet 0  . oseltamivir (TAMIFLU) 75 MG capsule Take 1 capsule (75 mg total) by mouth every 12 (twelve) hours. 10 capsule 0  . ticagrelor (BRILINTA) 90 MG TABS tablet Take 1 tablet (90 mg total) by mouth 2 (two) times daily. 60 tablet 2  . vitamin C (ASCORBIC ACID) 500 MG tablet Take 500 mg by mouth daily.     No facility-administered medications prior to visit.      Allergies:   Other   Social History   Socioeconomic History  . Marital status: Married    Spouse name: Not on file  . Number of children: Not on file  . Years of education: Not on file  . Highest education level: Not on file  Occupational History    Employer: [employer]    Comment: customer service [employer]  Social Needs  . Financial resource strain: Not on file  . Food insecurity:    Worry: Not on file    Inability: Not on file  . Transportation needs:    Medical: Not on file    Non-medical: Not on file  Tobacco Use  . Smoking status: Never Smoker  . Smokeless tobacco: Never Used  Substance and Sexual Activity  . Alcohol use: Yes    Comment: Occasional  . Drug use: No  . Sexual activity: Not on file  Lifestyle    . Physical activity:    Days per week: Not on file    Minutes per session: Not on file  . Stress: Not on file  Relationships  . Social connections:    Talks on phone: Not on file    Gets together: Not on file    Attends religious service: Not on file    Active member of club or organization: Not on file    Attends  meetings of clubs or organizations: Not on file    Relationship status: Not on file  Other Topics Concern  . Not on file  Social History Narrative  . Not on file     Family History:  The patient's family history includes Breast cancer (age of onset: 3) in his mother; CAD in his brother; Cervical cancer in his sister and sister; Prostate cancer (age of onset: 46) in his father.   ROS:   Please see the history of present illness.    ROS All other systems reviewed and are negative.   PHYSICAL EXAM:   VS:  BP 110/61 (BP Location: Left Arm, Patient Position: Sitting, Cuff Size: Large)   Pulse 64   Ht 5' 11.5" (1.816 m)   Wt 227 lb (103 kg)   BMI 31.22 kg/m     General: Alert, oriented x3, no distress, mildly obese Head: no evidence of trauma, PERRL, EOMI, no exophtalmos or lid lag, no myxedema, no xanthelasma; normal ears, nose and oropharynx Neck: normal jugular venous pulsations and no hepatojugular reflux; brisk carotid pulses without delay and no carotid bruits Chest: clear to auscultation, no signs of consolidation by percussion or palpation, normal fremitus, symmetrical and full respiratory excursions Cardiovascular: normal position and quality of the apical impulse, regular rhythm, normal first and second heart sounds, early to mid peaking 2-3/6 aortic ejection murmur at the right upper sternal border, no diastolic murmurs, rubs or gallops Abdomen: no tenderness or distention, no masses by palpation, no abnormal pulsatility or arterial bruits, normal bowel sounds, no hepatosplenomegaly Extremities: no clubbing, cyanosis or edema; 2+ radial, ulnar and brachial  pulses bilaterally; 2+ right femoral, posterior tibial and dorsalis pedis pulses; 2+ left femoral, posterior tibial and dorsalis pedis pulses; no subclavian or femoral bruits Neurological: grossly nonfocal Psych: Normal mood and affect   Wt Readings from Last 3 Encounters:  03/31/18 227 lb (103 kg)  07/26/16 233 lb 9 oz (105.9 kg)  06/16/16 230 lb (104.3 kg)      Studies/Labs Reviewed:   EKG:  EKG is  ordered today.  It shows normal sinus rhythm with first-degree AV block (PR 254 ms) and a single PAC.  There is minor ST segment depression and T wave inversion in leads I and aVL which is minimally more prominent compared to a year ago. Lipid Panel 2018  triglycerides 120, total cholesterol 113, LDL 47, HDL 42. 2019 LDL cholesterol 69 Lipid Panel     Component Value Date/Time   CHOL 223 (H) 06/01/2015 1202   TRIG 258 (H) 06/01/2015 1202   HDL 45 06/01/2015 1202   CHOLHDL 5.0 06/01/2015 1202   VLDL 52 (H) 06/01/2015 1202   LDLCALC 126 (H) 06/01/2015 1202     ASSESSMENT:    1. Shortness of breath   2. Coronary artery disease of native artery of native heart with stable angina pectoris ([place])   3. Essential hypertension   4. Hypercholesterolemia   5. Nonrheumatic aortic (valve) stenosis   6. Cardiac murmur      PLAN:  In order of problems listed above:  1. CAD: He has some exertional symptoms of chest discomfort and dyspnea.  Sounds highly compatible with stable angina pectoris.  These could be related to progression of disease in his right coronary artery or LAD artery, less likely late restenosis in the circumflex coronary artery.  They might also be related to worsening aortic stenosis.  We will schedule for nuclear stress testing and echocardiogram.  Continue aspirin and  statin and beta-blocker. 2. HTN: Well-controlled 3. HLP: Good LDL reduction on rosuvastatin.  Would switch from Lovaza to Vascepa for proven improvement in cardiovascular outcomes. 4. AS: Recheck  echocardiogram.    Medication Adjustments/Labs and Tests Ordered: Current medicines are reviewed at length with the patient today.  Concerns regarding medicines are outlined above.  Medication changes, Labs and Tests ordered today are listed in the Patient Instructions below. Patient Instructions  Medication Instructions:  Dr Sallyanne Kuster has recommended making the following medication changes: 1. STOP Lovaza 2. START Vascepa - take 2 grams twice daily  If you need a refill on your cardiac medications before your next appointment, please call your pharmacy.   Testing/Procedures: 1. Your physician has ordered an Exercise Myocardial Perfusion Imaging Study.   The test will take approximately 3 to 4 hours to complete; you may bring reading material.  If someone comes with you to your appointment, they will need to remain in the main lobby due to limited space in the testing area. **If you are pregnant or breastfeeding, please notify the nuclear lab prior to your appointment**  You will need to hold the following medications prior to your stress test: Metoprolol   How to prepare for your Myocardial Perfusion Test:  Do not eat or drink 3 hours prior to your test, except you may have water.  Do not consume products containing caffeine (regular or decaffeinated) 12 hours prior to your test. (ex: coffee, chocolate, sodas, tea).  Do wear comfortable clothes (no dresses or overalls) and walking shoes, tennis shoes preferred (No heels or open toe shoes are allowed).  Do NOT wear cologne, perfume, aftershave, or lotions (deodorant is allowed).  If these instructions are not followed, your test will have to be rescheduled.  2. Your physician has requested that you have an echocardiogram. Echocardiography is a painless test that uses sound waves to create images of your heart. It provides your doctor with information about the size and shape of your heart and how well your heart's chambers and valves  are working. This procedure takes approximately one hour. There are no restrictions for this procedure.  >> This will be performed at our [facility] location [place], [place] [address] [phone]  Follow-Up: At [facility], you and your health needs are our priority.  As part of our continuing mission to provide you with exceptional heart care, we have created designated Provider Care Teams.  These Care Teams include your primary Cardiologist (physician) and Advanced Practice Providers (APPs -  Physician Assistants and Nurse Practitioners) who all work together to provide you with the care you need, when you need it. You will need a follow up appointment in 12 months. You may see Sanda Klein, MD or one of the following Advanced Practice Providers on your designated Care Team: [place], [place] . Fabian Sharp, PA-C . You will receive a reminder letter in the mail two months in advance. If you don't receive a letter, please call our office to schedule the follow-up appointment.    Signed, Sanda Klein, MD  04/01/2018 1:55 PM    [facility] [address], [place], [place]  [address] Phone: [phone]; Fax: [phone]

## 2018-03-31 NOTE — Patient Instructions (Addendum)
Medication Instructions:  Dr Sallyanne Kuster has recommended making the following medication changes: 1. STOP Lovaza 2. START Vascepa - take 2 grams twice daily  If you need a refill on your cardiac medications before your next appointment, please call your pharmacy.   Testing/Procedures: 1. Your physician has ordered an Exercise Myocardial Perfusion Imaging Study.   The test will take approximately 3 to 4 hours to complete; you may bring reading material.  If someone comes with you to your appointment, they will need to remain in the main lobby due to limited space in the testing area. **If you are pregnant or breastfeeding, please notify the nuclear lab prior to your appointment**  You will need to hold the following medications prior to your stress test: Metoprolol   How to prepare for your Myocardial Perfusion Test:  Do not eat or drink 3 hours prior to your test, except you may have water.  Do not consume products containing caffeine (regular or decaffeinated) 12 hours prior to your test. (ex: coffee, chocolate, sodas, tea).  Do wear comfortable clothes (no dresses or overalls) and walking shoes, tennis shoes preferred (No heels or open toe shoes are allowed).  Do NOT wear cologne, perfume, aftershave, or lotions (deodorant is allowed).  If these instructions are not followed, your test will have to be rescheduled.  2. Your physician has requested that you have an echocardiogram. Echocardiography is a painless test that uses sound waves to create images of your heart. It provides your doctor with information about the size and shape of your heart and how well your heart's chambers and valves are working. This procedure takes approximately one hour. There are no restrictions for this procedure.  >> This will be performed at our [facility] location [address], [place] [address] [phone]  Follow-Up: At [facility], you and your health needs are our priority.  As  part of our continuing mission to provide you with exceptional heart care, we have created designated Provider Care Teams.  These Care Teams include your primary Cardiologist (physician) and Advanced Practice Providers (APPs -  Physician Assistants and Nurse Practitioners) who all work together to provide you with the care you need, when you need it. You will need a follow up appointment in 12 months. You may see Sanda Klein, MD or one of the following Advanced Practice Providers on your designated Care Team: [place], [place] . Fabian Sharp, PA-C . You will receive a reminder letter in the mail two months in advance. If you don't receive a letter, please call our office to schedule the follow-up appointment.

## 2018-04-01 ENCOUNTER — Other Ambulatory Visit: Payer: Self-pay | Admitting: *Deleted

## 2018-04-01 DIAGNOSIS — E785 Hyperlipidemia, unspecified: Secondary | ICD-10-CM

## 2018-04-01 DIAGNOSIS — E559 Vitamin D deficiency, unspecified: Secondary | ICD-10-CM | POA: Diagnosis not present

## 2018-04-01 DIAGNOSIS — E782 Mixed hyperlipidemia: Secondary | ICD-10-CM | POA: Diagnosis not present

## 2018-04-01 DIAGNOSIS — I10 Essential (primary) hypertension: Secondary | ICD-10-CM | POA: Diagnosis not present

## 2018-04-04 DIAGNOSIS — I208 Other forms of angina pectoris: Secondary | ICD-10-CM | POA: Diagnosis not present

## 2018-04-04 DIAGNOSIS — E782 Mixed hyperlipidemia: Secondary | ICD-10-CM | POA: Diagnosis not present

## 2018-04-04 DIAGNOSIS — I10 Essential (primary) hypertension: Secondary | ICD-10-CM | POA: Diagnosis not present

## 2018-04-04 DIAGNOSIS — I2510 Atherosclerotic heart disease of native coronary artery without angina pectoris: Secondary | ICD-10-CM | POA: Diagnosis not present

## 2018-04-04 DIAGNOSIS — R7303 Prediabetes: Secondary | ICD-10-CM | POA: Diagnosis not present

## 2018-04-04 DIAGNOSIS — E559 Vitamin D deficiency, unspecified: Secondary | ICD-10-CM | POA: Diagnosis not present

## 2018-04-04 DIAGNOSIS — Z6831 Body mass index (BMI) 31.0-31.9, adult: Secondary | ICD-10-CM | POA: Diagnosis not present

## 2018-04-06 ENCOUNTER — Telehealth: Payer: Self-pay

## 2018-04-06 ENCOUNTER — Other Ambulatory Visit (HOSPITAL_COMMUNITY): Payer: PPO

## 2018-04-06 DIAGNOSIS — E785 Hyperlipidemia, unspecified: Secondary | ICD-10-CM

## 2018-04-06 MED ORDER — ROSUVASTATIN CALCIUM 20 MG PO TABS: 20.0000 mg | ORAL_TABLET | Freq: Every day | ORAL | 3 refills | Status: DC

## 2018-04-06 NOTE — Telephone Encounter (Signed)
Rx(s) sent to pharmacy electronically.  

## 2018-04-15 ENCOUNTER — Encounter (HOSPITAL_COMMUNITY): Payer: PPO

## 2018-04-19 ENCOUNTER — Telehealth (HOSPITAL_COMMUNITY): Payer: Self-pay | Admitting: *Deleted

## 2018-04-19 NOTE — Telephone Encounter (Signed)
Left message on voicemail per DPR in reference to upcoming appointment scheduled on 04/26/18 at 1000 with detailed instructions given per Myocardial Perfusion Study Information Sheet for the test. LM to arrive 15 minutes early, and that it is imperative to arrive on time for appointment to keep from having the test rescheduled. If you need to cancel or reschedule your appointment, please call the office within 24 hours of your appointment. Failure to do so may result in a cancellation of your appointment, and a $50 no show fee. Phone number given for call back for any questions.  Perris Tripathi, [place]

## 2018-04-26 ENCOUNTER — Encounter (HOSPITAL_COMMUNITY)
Admission: EM | Disposition: A | Discharge status: Home / Self Care | Payer: Self-pay | Source: Ambulatory Visit | Attending: Cardiothoracic Surgery

## 2018-04-26 ENCOUNTER — Inpatient Hospital Stay (HOSPITAL_COMMUNITY)
Admission: EM | Admit: 2018-04-26 | Discharge: 2018-05-07 | DRG: 217 | Disposition: A | Discharge status: Home / Self Care | Payer: PPO | Source: Ambulatory Visit | Attending: Cardiothoracic Surgery | Admitting: Cardiothoracic Surgery

## 2018-04-26 ENCOUNTER — Ambulatory Visit (HOSPITAL_COMMUNITY): Payer: PPO

## 2018-04-26 ENCOUNTER — Encounter (HOSPITAL_COMMUNITY): Payer: Self-pay

## 2018-04-26 ENCOUNTER — Emergency Department (HOSPITAL_COMMUNITY): Payer: PPO

## 2018-04-26 ENCOUNTER — Other Ambulatory Visit: Payer: Self-pay

## 2018-04-26 ENCOUNTER — Ambulatory Visit (HOSPITAL_BASED_OUTPATIENT_CLINIC_OR_DEPARTMENT_OTHER): Payer: PPO

## 2018-04-26 VITALS — Ht 71.5 in | Wt 227.0 lb

## 2018-04-26 DIAGNOSIS — G4730 Sleep apnea, unspecified: Secondary | ICD-10-CM | POA: Diagnosis not present

## 2018-04-26 DIAGNOSIS — I2510 Atherosclerotic heart disease of native coronary artery without angina pectoris: Secondary | ICD-10-CM | POA: Diagnosis not present

## 2018-04-26 DIAGNOSIS — R55 Syncope and collapse: Secondary | ICD-10-CM

## 2018-04-26 DIAGNOSIS — Z8049 Family history of malignant neoplasm of other genital organs: Secondary | ICD-10-CM

## 2018-04-26 DIAGNOSIS — I447 Left bundle-branch block, unspecified: Secondary | ICD-10-CM | POA: Diagnosis not present

## 2018-04-26 DIAGNOSIS — Z9079 Acquired absence of other genital organ(s): Secondary | ICD-10-CM

## 2018-04-26 DIAGNOSIS — K029 Dental caries, unspecified: Secondary | ICD-10-CM | POA: Diagnosis present

## 2018-04-26 DIAGNOSIS — K0530 Chronic periodontitis, unspecified: Secondary | ICD-10-CM | POA: Diagnosis present

## 2018-04-26 DIAGNOSIS — Z955 Presence of coronary angioplasty implant and graft: Secondary | ICD-10-CM

## 2018-04-26 DIAGNOSIS — Z01810 Encounter for preprocedural cardiovascular examination: Secondary | ICD-10-CM | POA: Diagnosis not present

## 2018-04-26 DIAGNOSIS — E669 Obesity, unspecified: Secondary | ICD-10-CM | POA: Diagnosis not present

## 2018-04-26 DIAGNOSIS — R32 Unspecified urinary incontinence: Secondary | ICD-10-CM | POA: Diagnosis not present

## 2018-04-26 DIAGNOSIS — J9811 Atelectasis: Secondary | ICD-10-CM

## 2018-04-26 DIAGNOSIS — R0602 Shortness of breath: Secondary | ICD-10-CM | POA: Insufficient documentation

## 2018-04-26 DIAGNOSIS — K036 Deposits [accretions] on teeth: Secondary | ICD-10-CM | POA: Diagnosis present

## 2018-04-26 DIAGNOSIS — Z01818 Encounter for other preprocedural examination: Secondary | ICD-10-CM

## 2018-04-26 DIAGNOSIS — E7800 Pure hypercholesterolemia, unspecified: Secondary | ICD-10-CM | POA: Diagnosis not present

## 2018-04-26 DIAGNOSIS — I25700 Atherosclerosis of coronary artery bypass graft(s), unspecified, with unstable angina pectoris: Secondary | ICD-10-CM | POA: Diagnosis not present

## 2018-04-26 DIAGNOSIS — D62 Acute posthemorrhagic anemia: Secondary | ICD-10-CM | POA: Diagnosis not present

## 2018-04-26 DIAGNOSIS — I25119 Atherosclerotic heart disease of native coronary artery with unspecified angina pectoris: Secondary | ICD-10-CM

## 2018-04-26 DIAGNOSIS — I480 Paroxysmal atrial fibrillation: Secondary | ICD-10-CM | POA: Diagnosis not present

## 2018-04-26 DIAGNOSIS — I350 Nonrheumatic aortic (valve) stenosis: Secondary | ICD-10-CM | POA: Diagnosis not present

## 2018-04-26 DIAGNOSIS — Z85828 Personal history of other malignant neoplasm of skin: Secondary | ICD-10-CM

## 2018-04-26 DIAGNOSIS — J90 Pleural effusion, not elsewhere classified: Secondary | ICD-10-CM | POA: Diagnosis not present

## 2018-04-26 DIAGNOSIS — I25118 Atherosclerotic heart disease of native coronary artery with other forms of angina pectoris: Secondary | ICD-10-CM | POA: Diagnosis not present

## 2018-04-26 DIAGNOSIS — E785 Hyperlipidemia, unspecified: Secondary | ICD-10-CM | POA: Diagnosis present

## 2018-04-26 DIAGNOSIS — Z8546 Personal history of malignant neoplasm of prostate: Secondary | ICD-10-CM

## 2018-04-26 DIAGNOSIS — I712 Thoracic aortic aneurysm, without rupture: Secondary | ICD-10-CM | POA: Diagnosis not present

## 2018-04-26 DIAGNOSIS — I10 Essential (primary) hypertension: Secondary | ICD-10-CM | POA: Diagnosis present

## 2018-04-26 DIAGNOSIS — Z951 Presence of aortocoronary bypass graft: Secondary | ICD-10-CM | POA: Diagnosis not present

## 2018-04-26 DIAGNOSIS — Z8249 Family history of ischemic heart disease and other diseases of the circulatory system: Secondary | ICD-10-CM

## 2018-04-26 DIAGNOSIS — I358 Other nonrheumatic aortic valve disorders: Secondary | ICD-10-CM | POA: Diagnosis not present

## 2018-04-26 DIAGNOSIS — R079 Chest pain, unspecified: Secondary | ICD-10-CM | POA: Diagnosis not present

## 2018-04-26 DIAGNOSIS — K06020 Generalized gingival recession, unspecified: Secondary | ICD-10-CM | POA: Diagnosis present

## 2018-04-26 DIAGNOSIS — R7303 Prediabetes: Secondary | ICD-10-CM | POA: Diagnosis present

## 2018-04-26 DIAGNOSIS — Z6830 Body mass index (BMI) 30.0-30.9, adult: Secondary | ICD-10-CM

## 2018-04-26 DIAGNOSIS — J984 Other disorders of lung: Secondary | ICD-10-CM | POA: Diagnosis not present

## 2018-04-26 DIAGNOSIS — I080 Rheumatic disorders of both mitral and aortic valves: Secondary | ICD-10-CM | POA: Diagnosis not present

## 2018-04-26 DIAGNOSIS — E8770 Fluid overload, unspecified: Secondary | ICD-10-CM | POA: Diagnosis not present

## 2018-04-26 DIAGNOSIS — R918 Other nonspecific abnormal finding of lung field: Secondary | ICD-10-CM | POA: Diagnosis not present

## 2018-04-26 DIAGNOSIS — R946 Abnormal results of thyroid function studies: Secondary | ICD-10-CM | POA: Diagnosis not present

## 2018-04-26 DIAGNOSIS — R2681 Unsteadiness on feet: Secondary | ICD-10-CM | POA: Diagnosis not present

## 2018-04-26 DIAGNOSIS — R51 Headache: Secondary | ICD-10-CM | POA: Diagnosis not present

## 2018-04-26 DIAGNOSIS — Z803 Family history of malignant neoplasm of breast: Secondary | ICD-10-CM

## 2018-04-26 DIAGNOSIS — I252 Old myocardial infarction: Secondary | ICD-10-CM | POA: Diagnosis not present

## 2018-04-26 DIAGNOSIS — R05 Cough: Secondary | ICD-10-CM | POA: Diagnosis not present

## 2018-04-26 DIAGNOSIS — I208 Other forms of angina pectoris: Secondary | ICD-10-CM | POA: Diagnosis not present

## 2018-04-26 DIAGNOSIS — J939 Pneumothorax, unspecified: Secondary | ICD-10-CM

## 2018-04-26 DIAGNOSIS — I499 Cardiac arrhythmia, unspecified: Secondary | ICD-10-CM | POA: Diagnosis not present

## 2018-04-26 DIAGNOSIS — K08409 Partial loss of teeth, unspecified cause, unspecified class: Secondary | ICD-10-CM | POA: Diagnosis present

## 2018-04-26 DIAGNOSIS — Z79899 Other long term (current) drug therapy: Secondary | ICD-10-CM

## 2018-04-26 DIAGNOSIS — Z8042 Family history of malignant neoplasm of prostate: Secondary | ICD-10-CM

## 2018-04-26 DIAGNOSIS — I4891 Unspecified atrial fibrillation: Secondary | ICD-10-CM | POA: Diagnosis not present

## 2018-04-26 DIAGNOSIS — Z98818 Other dental procedure status: Secondary | ICD-10-CM

## 2018-04-26 DIAGNOSIS — K0820 Unspecified atrophy of edentulous alveolar ridge: Secondary | ICD-10-CM | POA: Diagnosis present

## 2018-04-26 DIAGNOSIS — Z7982 Long term (current) use of aspirin: Secondary | ICD-10-CM

## 2018-04-26 DIAGNOSIS — R110 Nausea: Secondary | ICD-10-CM | POA: Diagnosis not present

## 2018-04-26 DIAGNOSIS — I4892 Unspecified atrial flutter: Secondary | ICD-10-CM | POA: Diagnosis not present

## 2018-04-26 DIAGNOSIS — D696 Thrombocytopenia, unspecified: Secondary | ICD-10-CM | POA: Diagnosis not present

## 2018-04-26 DIAGNOSIS — M1990 Unspecified osteoarthritis, unspecified site: Secondary | ICD-10-CM | POA: Diagnosis present

## 2018-04-26 DIAGNOSIS — Z952 Presence of prosthetic heart valve: Secondary | ICD-10-CM

## 2018-04-26 DIAGNOSIS — R0789 Other chest pain: Secondary | ICD-10-CM | POA: Diagnosis not present

## 2018-04-26 HISTORY — PX: RIGHT/LEFT HEART CATH AND CORONARY ANGIOGRAPHY: CATH118266

## 2018-04-26 LAB — POCT I-STAT 3, ART BLOOD GAS (G3+)
Acid-base deficit: 2 mmol/L (ref 0.0–2.0)
Bicarbonate: 23.3 mmol/L (ref 20.0–28.0)
O2 Saturation: 94 %
TCO2: 24 mmol/L (ref 22–32)
pCO2 arterial: 40 mmHg (ref 32.0–48.0)
pH, Arterial: 7.373 (ref 7.350–7.450)
pO2, Arterial: 72 mmHg — ABNORMAL LOW (ref 83.0–108.0)

## 2018-04-26 LAB — CBC WITH DIFFERENTIAL/PLATELET
Abs Immature Granulocytes: 0.02 10*3/uL (ref 0.00–0.07)
Basophils Absolute: 0 10*3/uL (ref 0.0–0.1)
Basophils Relative: 1 %
EOS ABS: 0.2 10*3/uL (ref 0.0–0.5)
Eosinophils Relative: 4 %
HCT: 38 % — ABNORMAL LOW (ref 39.0–52.0)
Hemoglobin: 13 g/dL (ref 13.0–17.0)
Immature Granulocytes: 0 %
LYMPHS ABS: 1.5 10*3/uL (ref 0.7–4.0)
Lymphocytes Relative: 29 %
MCH: 29.5 pg (ref 26.0–34.0)
MCHC: 34.2 g/dL (ref 30.0–36.0)
MCV: 86.4 fL (ref 80.0–100.0)
Monocytes Absolute: 0.5 10*3/uL (ref 0.1–1.0)
Monocytes Relative: 10 %
Neutro Abs: 2.9 10*3/uL (ref 1.7–7.7)
Neutrophils Relative %: 56 %
Platelets: 202 10*3/uL (ref 150–400)
RBC: 4.4 MIL/uL (ref 4.22–5.81)
RDW: 13 % (ref 11.5–15.5)
WBC: 5.2 10*3/uL (ref 4.0–10.5)
nRBC: 0 % (ref 0.0–0.2)

## 2018-04-26 LAB — I-STAT CHEM 8, ED
BUN: 26 mg/dL — ABNORMAL HIGH (ref 8–23)
CHLORIDE: 105 mmol/L (ref 98–111)
Calcium, Ion: 1.1 mmol/L — ABNORMAL LOW (ref 1.15–1.40)
Creatinine, Ser: 1.2 mg/dL (ref 0.61–1.24)
Glucose, Bld: 115 mg/dL — ABNORMAL HIGH (ref 70–99)
HCT: 35 % — ABNORMAL LOW (ref 39.0–52.0)
Hemoglobin: 11.9 g/dL — ABNORMAL LOW (ref 13.0–17.0)
Potassium: 4.2 mmol/L (ref 3.5–5.1)
Sodium: 137 mmol/L (ref 135–145)
TCO2: 25 mmol/L (ref 22–32)

## 2018-04-26 LAB — POCT I-STAT 3, VENOUS BLOOD GAS (G3P V)
Acid-base deficit: 2 mmol/L (ref 0.0–2.0)
Acid-base deficit: 2 mmol/L (ref 0.0–2.0)
Bicarbonate: 23.8 mmol/L (ref 20.0–28.0)
Bicarbonate: 24.1 mmol/L (ref 20.0–28.0)
O2 Saturation: 64 %
O2 Saturation: 64 %
TCO2: 25 mmol/L (ref 22–32)
TCO2: 25 mmol/L (ref 22–32)
pCO2, Ven: 43 mmHg — ABNORMAL LOW (ref 44.0–60.0)
pCO2, Ven: 43.5 mmHg — ABNORMAL LOW (ref 44.0–60.0)
pH, Ven: 7.351 (ref 7.250–7.430)
pH, Ven: 7.352 (ref 7.250–7.430)
pO2, Ven: 35 mmHg (ref 32.0–45.0)
pO2, Ven: 35 mmHg (ref 32.0–45.0)

## 2018-04-26 LAB — COMPREHENSIVE METABOLIC PANEL
ALT: 17 U/L (ref 0–44)
AST: 21 U/L (ref 15–41)
Albumin: 3.9 g/dL (ref 3.5–5.0)
Alkaline Phosphatase: 33 U/L — ABNORMAL LOW (ref 38–126)
Anion gap: 10 (ref 5–15)
BUN: 25 mg/dL — AB (ref 8–23)
CO2: 21 mmol/L — ABNORMAL LOW (ref 22–32)
Calcium: 8.9 mg/dL (ref 8.9–10.3)
Chloride: 105 mmol/L (ref 98–111)
Creatinine, Ser: 1.2 mg/dL (ref 0.61–1.24)
GFR calc Af Amer: >60 mL/min (ref >60)
GFR calc non Af Amer: 59 mL/min — ABNORMAL LOW (ref >60)
GLUCOSE: 117 mg/dL — AB (ref 70–99)
Potassium: 4.2 mmol/L (ref 3.5–5.1)
Sodium: 136 mmol/L (ref 135–145)
Total Bilirubin: 1 mg/dL (ref 0.3–1.2)
Total Protein: 7 g/dL (ref 6.5–8.1)

## 2018-04-26 LAB — I-STAT TROPONIN, ED: Troponin i, poc: 0 ng/mL (ref 0.00–0.08)

## 2018-04-26 LAB — MYOCARDIAL PERFUSION IMAGING: SRS: 1

## 2018-04-26 SURGERY — RIGHT/LEFT HEART CATH AND CORONARY ANGIOGRAPHY
Anesthesia: LOCAL

## 2018-04-26 MED ORDER — ATORVASTATIN CALCIUM 80 MG PO TABS
80.0000 mg | ORAL_TABLET | Freq: Every day | ORAL | Status: DC
  Administered 2018-04-27 – 2018-04-28 (×2): 80 mg via ORAL
  Filled 2018-04-26 (×2): qty 1

## 2018-04-26 MED ORDER — ACETAMINOPHEN 325 MG PO TABS
650.0000 mg | ORAL_TABLET | ORAL | Status: DC | PRN
  Administered 2018-04-26: 650 mg via ORAL
  Filled 2018-04-26: qty 2

## 2018-04-26 MED ORDER — ASPIRIN 81 MG PO CHEW
81.0000 mg | CHEWABLE_TABLET | Freq: Every day | ORAL | Status: DC
  Administered 2018-04-27 – 2018-04-28 (×2): 81 mg via ORAL
  Filled 2018-04-26 (×2): qty 1

## 2018-04-26 MED ORDER — SODIUM CHLORIDE 0.9 % WEIGHT BASED INFUSION
1.0000 mL/kg/h | INTRAVENOUS | Status: DC
  Administered 2018-04-26: 1.002 mL/kg/h via INTRAVENOUS

## 2018-04-26 MED ORDER — MIDAZOLAM HCL 2 MG/2ML IJ SOLN
INTRAMUSCULAR | Status: AC
  Filled 2018-04-26: qty 2

## 2018-04-26 MED ORDER — HEPARIN (PORCINE) IN NACL 1000-0.9 UT/500ML-% IV SOLN
INTRAVENOUS | Status: DC | PRN
  Administered 2018-04-26 (×3): 500 mL

## 2018-04-26 MED ORDER — HEPARIN (PORCINE) IN NACL 1000-0.9 UT/500ML-% IV SOLN
INTRAVENOUS | Status: AC
  Filled 2018-04-26: qty 1500

## 2018-04-26 MED ORDER — HEPARIN SODIUM (PORCINE) 1000 UNIT/ML IJ SOLN
INTRAMUSCULAR | Status: DC | PRN
  Administered 2018-04-26: 5000 [IU] via INTRAVENOUS

## 2018-04-26 MED ORDER — LIDOCAINE HCL (PF) 1 % IJ SOLN
INTRAMUSCULAR | Status: DC | PRN
  Administered 2018-04-26 (×2): 2 mL

## 2018-04-26 MED ORDER — IOHEXOL 350 MG/ML SOLN
INTRAVENOUS | Status: DC | PRN
  Administered 2018-04-26: 100 mL via INTRA_ARTERIAL

## 2018-04-26 MED ORDER — SODIUM CHLORIDE 0.9% FLUSH: 3.0000 mL | INTRAVENOUS | Status: DC | PRN

## 2018-04-26 MED ORDER — SODIUM CHLORIDE 0.9 % IV SOLN: 250.0000 mL | INTRAVENOUS | Status: DC | PRN

## 2018-04-26 MED ORDER — SODIUM CHLORIDE 0.9 % IV SOLN
INTRAVENOUS | Status: DC
  Administered 2018-04-26 – 2018-04-29 (×4): via INTRAVENOUS

## 2018-04-26 MED ORDER — LIDOCAINE HCL (PF) 1 % IJ SOLN
INTRAMUSCULAR | Status: AC
  Filled 2018-04-26: qty 30

## 2018-04-26 MED ORDER — VERAPAMIL HCL 2.5 MG/ML IV SOLN
INTRAVENOUS | Status: DC | PRN
  Administered 2018-04-26: 10 mL via INTRA_ARTERIAL

## 2018-04-26 MED ORDER — VERAPAMIL HCL 2.5 MG/ML IV SOLN
INTRAVENOUS | Status: AC
  Filled 2018-04-26: qty 2

## 2018-04-26 MED ORDER — ONDANSETRON HCL 4 MG/2ML IJ SOLN
4.0000 mg | Freq: Four times a day (QID) | INTRAMUSCULAR | Status: DC | PRN
  Administered 2018-04-29: 4 mg via INTRAVENOUS

## 2018-04-26 MED ORDER — DIAZEPAM 5 MG PO TABS
5.0000 mg | ORAL_TABLET | ORAL | Status: DC | PRN
  Administered 2018-04-26 – 2018-04-27 (×2): 5 mg via ORAL
  Filled 2018-04-26 (×2): qty 1

## 2018-04-26 MED ORDER — TECHNETIUM TC 99M TETROFOSMIN IV KIT
11.0000 | PACK | Freq: Once | INTRAVENOUS | Status: AC | PRN
  Administered 2018-04-26: 11 via INTRAVENOUS
  Filled 2018-04-26: qty 11

## 2018-04-26 MED ORDER — HEPARIN SODIUM (PORCINE) 5000 UNIT/ML IJ SOLN
5000.0000 [IU] | Freq: Three times a day (TID) | INTRAMUSCULAR | Status: DC
  Administered 2018-04-26 – 2018-04-28 (×7): 5000 [IU] via SUBCUTANEOUS
  Filled 2018-04-26 (×7): qty 1

## 2018-04-26 MED ORDER — SODIUM CHLORIDE 0.9% FLUSH
3.0000 mL | Freq: Two times a day (BID) | INTRAVENOUS | Status: DC
  Administered 2018-04-28: 3 mL via INTRAVENOUS

## 2018-04-26 MED ORDER — SODIUM CHLORIDE 0.9 % WEIGHT BASED INFUSION
3.0000 mL/kg/h | INTRAVENOUS | Status: DC
  Administered 2018-04-26: 3 mL/kg/h via INTRAVENOUS

## 2018-04-26 MED ORDER — FENTANYL CITRATE (PF) 100 MCG/2ML IJ SOLN
INTRAMUSCULAR | Status: DC | PRN
  Administered 2018-04-26: 25 ug via INTRAVENOUS

## 2018-04-26 MED ORDER — FENTANYL CITRATE (PF) 100 MCG/2ML IJ SOLN
INTRAMUSCULAR | Status: AC
  Filled 2018-04-26: qty 2

## 2018-04-26 MED ORDER — MIDAZOLAM HCL 2 MG/2ML IJ SOLN
INTRAMUSCULAR | Status: DC | PRN
  Administered 2018-04-26: 2 mg via INTRAVENOUS

## 2018-04-26 MED ORDER — ASPIRIN 81 MG PO CHEW
81.0000 mg | CHEWABLE_TABLET | ORAL | Status: AC
  Administered 2018-04-26: 81 mg via ORAL
  Filled 2018-04-26: qty 1

## 2018-04-26 MED ORDER — NITROGLYCERIN 2 % TD OINT
1.0000 [in_us] | TOPICAL_OINTMENT | Freq: Once | TRANSDERMAL | Status: AC
  Administered 2018-04-26: 1 [in_us] via TOPICAL
  Filled 2018-04-26: qty 1

## 2018-04-26 SURGICAL SUPPLY — 19 items
CATH BALLN WEDGE 5F 110CM (CATHETERS) ×1 IMPLANT
CATH INFINITI 5 FR JL3.5 (CATHETERS) ×1 IMPLANT
CATH INFINITI 5FR AL1 (CATHETERS) ×1 IMPLANT
CATH INFINITI 5FR ANG PIGTAIL (CATHETERS) ×1 IMPLANT
CATH OPTITORQUE TIG 4.0 5F (CATHETERS) ×1 IMPLANT
DEVICE RAD COMP TR BAND LRG (VASCULAR PRODUCTS) ×1 IMPLANT
GLIDESHEATH SLEND SS 6F .021 (SHEATH) ×1 IMPLANT
GUIDEWIRE INQWIRE 1.5J.035X260 (WIRE) IMPLANT
INQWIRE 1.5J .035X260CM (WIRE) ×2
KIT HEART LEFT (KITS) ×2 IMPLANT
PACK CARDIAC CATHETERIZATION (CUSTOM PROCEDURE TRAY) ×2 IMPLANT
SHEATH GLIDE SLENDER 4/5FR (SHEATH) ×1 IMPLANT
SYR MEDRAD MARK 7 150ML (SYRINGE) ×2 IMPLANT
TRANSDUCER W/STOPCOCK (MISCELLANEOUS) ×3 IMPLANT
TUBING ART PRESS 72  MALE/FEM (TUBING) ×1
TUBING ART PRESS 72 MALE/FEM (TUBING) IMPLANT
TUBING CIL FLEX 10 FLL-RA (TUBING) ×2 IMPLANT
WIRE EMERALD 3MM-J .025X260CM (WIRE) ×1 IMPLANT
WIRE EMERALD ST .035X150CM (WIRE) ×1 IMPLANT

## 2018-04-26 NOTE — Plan of Care (Signed)
  Problem: Spiritual Needs Goal: Ability to function at adequate level Outcome: Progressing   

## 2018-04-26 NOTE — H&P (Signed)
Cardiology Admission History and Physical:   Patient ID: Lawrence Hall MRN: [id number]; DOB: [birth year]   Admission date: 04/26/2018  Primary Care Provider: Fanny Bien, MD Primary Cardiologist: Sanda Klein, MD  Primary Electrophysiologist:  None   Chief Complaint: Abnormal stress test/exertional angina and dyspnea  Patient Profile:   Lawrence Hall is a 73 y.o. male with history of mild to moderate aortic stenosis, hypertension, hyperlipidemia and coronary artery disease status post non-STEMI and drug-eluting stent to left circumflex coronary artery in 2017, recently returning with exertional dyspnea and exertional angina, with markedly abnormal stress test today with exercise-induced hypotension and marked ST segment changes.  History of Present Illness:   Lawrence Hall was sent to the emergency room from the office today after undergoing a treadmill stress test during which she developed near syncope due to severe hypotension and had marked ST segment segment depression in multiple leads with ST segment elevation in lead aVR, maximum magnitude of about 4 mm.  He is asymptomatic at rest.  He felt back to normal roughly 20 minutes after the stress test was performed.  His ECG performed in the emergency room shows sinus rhythm with PACs without any ST-T changes.  He has had slowly progressive CCS class II and NYHA class II exertional dyspnea.  He has been volunteering in cardiac rehab, but notes that his exercise tolerance has slowly decreased.  He denies edema, syncope, palpitations, claudication or focal neurological events.  He does complain of urgency and occasional incontinence related to previous prostate surgery.  In February 2017 he presented with a non-ST segment elevation myocardial infarction and received a drug-eluting stent (3.5 x 34 mm resolute) in the proximal left circumflex coronary artery.  He was also noted to have a 70% stenosis in the mid right coronary artery and a 50%  proximal LAD stenosis.  He has normal left ventricular systolic function.  At that time an echocardiogram showed mild to moderate aortic valve stenosis with an estimated aortic valve area around 1.4 cm mean gradient of 18 mmHg.  Takes a statin for hyperlipidemia and his most recent LDL cholesterol was 69, a little higher than 47 a year earlier.  He is obese and has prediabetes.   Past Medical History:  Diagnosis Date  . Arthritis    mild hands  . Asthma   . Cataract    early signs b/l eyes  . Dysrhythmia    PAC'S  . ED (erectile dysfunction)   . H/O hiatal hernia   . Hypercholesterolemia   . Hypertension    MARGINALLY HIGH - NO MEDS -MONITORS  . Prostate cancer ([place]) 03/27/2011   prostate bx=adenocarcinoma,  . Skin cancer   . Skin cancer 2008   basal cell face /removed  . Sleep apnea    USES NOSTRIL APARATUS - NO C-PAP- "BORDERLINE SLEEP APMNEA"    Past Surgical History:  Procedure Laterality Date  . basal cell cancer  2008   inside nose  . CARDIAC CATHETERIZATION N/A 06/03/2015   Procedure: Left Heart Cath and Coronary Angiography;  Surgeon: [name] Sine, MD;  Location: [facility];  Service: Cardiovascular;  Laterality: N/A;  . ROBOT ASSISTED LAPAROSCOPIC RADICAL PROSTATECTOMY  08/10/2011   Procedure: ROBOTIC ASSISTED LAPAROSCOPIC RADICAL PROSTATECTOMY LEVEL 2;  Surgeon: Dutch Gray, MD;  Location: [facility];  Service: Urology;  Laterality: N/A;  . THYROID SURGERY  age [age]     Medications Prior to Admission: Prior to Admission medications   Medication Sig Start  Date End Date Taking? Authorizing Provider  aspirin EC 81 MG EC tablet Take 1 tablet (81 mg total) by mouth daily. 06/04/15   Florencia Reasons, MD  benzonatate (TESSALON) 100 MG capsule Take 1 capsule (100 mg total) by mouth 3 (three) times daily as needed for cough. 07/27/16   Horton, Barbette Hair, MD  Cholecalciferol (VITAMIN D-3) 125 MCG (5000 UT) TABS Take 5,000 Units by mouth daily.    [provider]    Coenzyme Q10 (COQ10) 200 MG CAPS Take 200 mg by mouth daily.    [provider]  Icosapent Ethyl (VASCEPA) 1 g CAPS Take 2 capsules (2 g total) by mouth 2 (two) times daily. 03/31/18   Marieta Markov, MD  MAGNESIUM-ZINC PO Take 1 tablet by mouth.    [provider]  metoprolol succinate (TOPROL-XL) 25 MG 24 hr tablet TAKE 1 TABLET ONCE DAILY. 06/29/16   Kilah Drahos, MD  nitroGLYCERIN (NITROSTAT) 0.4 MG SL tablet 1 TAB UNDER TONGUE AS NEEDED FOR CHEST PAIN. MAY REPEAT EVERY 5 MIN FOR A TOTAL OF 3 DOSES. 01/06/17   Jamika Sadek, MD  rosuvastatin (CRESTOR) 20 MG tablet Take 1 tablet (20 mg total) by mouth daily. 04/06/18   Len Kluver, MD  rosuvastatin (CRESTOR) 5 MG tablet Take 5 mg by mouth daily.    [provider]  vitamin E 400 UNIT capsule Take 400 Units by mouth daily.    [provider]     Allergies:    Allergies  Allergen Reactions  . Other Other (See Comments)    Pet dander, seasonal allergies    Social History:   Social History   Socioeconomic History  . Marital status: Married    Spouse name: Not on file  . Number of children: Not on file  . Years of education: Not on file  . Highest education level: Not on file  Occupational History    Employer: [employer]    Comment: customer service [employer]  Social Needs  . Financial resource strain: Not on file  . Food insecurity:    Worry: Not on file    Inability: Not on file  . Transportation needs:    Medical: Not on file    Non-medical: Not on file  Tobacco Use  . Smoking status: Never Smoker  . Smokeless tobacco: Never Used  Substance and Sexual Activity  . Alcohol use: Yes    Comment: Occasional  . Drug use: No  . Sexual activity: Not on file  Lifestyle  . Physical activity:    Days per week: Not on file    Minutes per session: Not on file  . Stress: Not on file  Relationships  . Social connections:    Talks on phone: Not on file    Gets together: Not on  file    Attends religious service: Not on file    Active member of club or organization: Not on file    Attends meetings of clubs or organizations: Not on file    Relationship status: Not on file  . Intimate partner violence:    Fear of current or ex partner: Not on file    Emotionally abused: Not on file    Physically abused: Not on file    Forced sexual activity: Not on file  Other Topics Concern  . Not on file  Social History Narrative  . Not on file    Family History:   The patient's family history includes Breast cancer (age of  onset: 61) in his mother; CAD in his brother; Cervical cancer in his sister and sister; Prostate cancer (age of onset: 50) in his father.    ROS:  Please see the history of present illness.  All other ROS reviewed and negative.     Physical Exam/Data:   Vitals:   04/26/18 1206 04/26/18 1216 04/26/18 1217  BP:  107/75   Pulse:  65   Resp:  14   Temp:  98.2 F (36.8 C)   TempSrc:  Oral   SpO2: 97% 97%   Weight:   99.8 kg  Height:   6' (1.829 m)   No intake or output data in the 24 hours ending 04/26/18 1324 Filed Weights   04/26/18 1217  Weight: 99.8 kg   Body mass index is 29.84 kg/m.  General:  Well nourished, well developed, in no acute distress HEENT: normal Lymph: no adenopathy Neck: no JVD Endocrine:  No thryomegaly Vascular: No carotid bruits; FA pulses 2+ bilaterally without bruits  Cardiac:  normal S1, S2; RRR; 2-3/6 early peaking systolic ejection murmur in the aortic focus radiating towards the carotids, no diastolic murmur  Lungs:  clear to auscultation bilaterally, no wheezing, rhonchi or rales  Abd: soft, nontender, no hepatomegaly  Ext: no edema Musculoskeletal:  No deformities, BUE and BLE strength normal and equal Skin: warm and dry  Neuro:  CNs 2-12 intact, no focal abnormalities noted Psych:  Normal affect    EKG:  The ECG that was done  was personally reviewed and demonstrates sinus rhythm with PACs, ST segments  back to baseline  Relevant CV Studies: Treadmill stress test shows up to 4 mm horizontal ST segment depression in leads II, 3, F, V4-V6, 1, aVL with 3 mm ST segment elevation in lead aVR  Laboratory Data:  Chemistry Recent Labs  Lab 04/26/18 1212 04/26/18 1232  NA 136 137  K 4.2 4.2  CL 105 105  CO2 21*  --   GLUCOSE 117* 115*  BUN 25* 26*  CREATININE 1.20 1.20  CALCIUM 8.9  --   GFRNONAA 59*  --   GFRAA >60  --   ANIONGAP 10  --     Recent Labs  Lab 04/26/18 1212  PROT 7.0  ALBUMIN 3.9  AST 21  ALT 17  ALKPHOS 33*  BILITOT 1.0   Hematology Recent Labs  Lab 04/26/18 1212 04/26/18 1232  WBC 5.2  --   RBC 4.40  --   HGB 13.0 11.9*  HCT 38.0* 35.0*  MCV 86.4  --   MCH 29.5  --   MCHC 34.2  --   RDW 13.0  --   PLT 202  --    Cardiac EnzymesNo results for input(s): TROPONINI in the last 168 hours.  Recent Labs  Lab 04/26/18 1231  TROPIPOC 0.00    BNPNo results for input(s): BNP, PROBNP in the last 168 hours.  DDimer No results for input(s): DDIMER in the last 168 hours.  Radiology/Studies:  Dg Chest Port 1 View  Result Date: 04/26/2018 CLINICAL DATA:  Chest pain and ST depression during stress test EXAM: PORTABLE CHEST 1 VIEW COMPARISON:  07/26/2016 FINDINGS: Cardiac shadow is stable. Mild aortic calcifications are again seen and stable. The lungs are well aerated without focal infiltrate or sizable effusion. No bony abnormality is seen. IMPRESSION: No acute abnormality noted. Electronically Signed   By: Inez Catalina M.D.   On: 04/26/2018 12:36    Assessment and Plan:   1. CAD: Clinical  presentation and treadmill stress test findings are strongly suggestive of progression of CAD, likely with multivessel (or left main) involvement.  Recommend urgent cardiac catheterization with revascularization as appropriate. 2. AS: We will get the opportunity to look for worsening gradient across the valve at the time of his cardiac catheterization, but he should also  have an echo to reevaluate the aortic valve.  It is unlikely that he will have progressed to severe aortic stenosis and just under 3 years, but if he has an indication for coronary artery bypass surgery he would benefit from aortic valve replacement at that time. 3. HLP: Despite excellent control of his LDL cholesterol it appears that he has progression of disease.  We will need to discuss intensifying lipid-lowering therapy, but also improving his lifestyle.  He needs to exercise more frequently and lose weight.  Severity of Illness: The appropriate patient status for this patient is OBSERVATION. Observation status is judged to be reasonable and necessary in order to provide the required intensity of service to ensure the patient's safety. The patient's presenting symptoms, physical exam findings, and initial radiographic and laboratory data in the context of their medical condition is felt to place them at decreased risk for further clinical deterioration. Furthermore, it is anticipated that the patient will be medically stable for discharge from the hospital within 2 midnights of admission. The following factors support the patient status of observation.   " The patient's presenting symptoms include angina and dyspnea with mild to moderate activity. " The physical exam findings include murmur of aortic stenosis. " The initial radiographic and laboratory data are severe ST segment changes during treadmill stress test.     For questions or updates, please contact [facility] Please consult [email or web address] for contact info under        Signed, Sanda Klein, MD  04/26/2018 1:24 PM ]

## 2018-04-26 NOTE — ED Provider Notes (Signed)
[facility] EMERGENCY DEPARTMENT Provider Note   CSN: [id number] Arrival date & time: 04/26/18  1203     History   Chief Complaint Chief Complaint  Patient presents with  . Near Syncope    HPI Lawrence Hall is a 73 y.o. male.  Patient with a history of coronary disease.  He was getting a stress test done at the cardiologist's office and became weak started having pain and his stress test showed significant ST depression inferiorly.  He was sent to the emergency department.  Patient states he feels better now  The history is provided by the patient. No language interpreter was used.  Near Syncope  This is a new problem. The current episode started less than 1 hour ago. The problem occurs rarely. The problem has been resolved. Associated symptoms include chest pain. Pertinent negatives include no abdominal pain and no headaches. The symptoms are aggravated by exertion. Nothing relieves the symptoms. He has tried nothing for the symptoms. The treatment provided no relief.    Past Medical History:  Diagnosis Date  . Arthritis    mild hands  . Asthma   . Cataract    early signs b/l eyes  . Dysrhythmia    PAC'S  . ED (erectile dysfunction)   . H/O hiatal hernia   . Hypercholesterolemia   . Hypertension    MARGINALLY HIGH - NO MEDS -MONITORS  . Prostate cancer ([place]) 03/27/2011   prostate bx=adenocarcinoma,  . Skin cancer   . Skin cancer 2008   basal cell face /removed  . Sleep apnea    USES NOSTRIL APARATUS - NO C-PAP- "BORDERLINE SLEEP APMNEA"    Patient Active Problem List   Diagnosis Date Noted  . CAD (coronary artery disease) 06/12/2015  . NSTEMI (non-ST elevated myocardial infarction) ([place])   . Nonrheumatic aortic (valve) stenosis   . Non-STEMI (non-ST elevated myocardial infarction) ([place])   . Essential hypertension 06/01/2015  . Asthma 05/31/2015  . Elevated troponin 05/31/2015  . Chest pain 07/25/2012  . Arthritis   . Hyperlipidemia   .  Hypercholesterolemia   . Skin cancer   . ED (erectile dysfunction)   . Prostate cancer ([place]) 03/27/2011    Past Surgical History:  Procedure Laterality Date  . basal cell cancer  2008   inside nose  . CARDIAC CATHETERIZATION N/A 06/03/2015   Procedure: Left Heart Cath and Coronary Angiography;  Surgeon: [name] Sine, MD;  Location: [facility];  Service: Cardiovascular;  Laterality: N/A;  . ROBOT ASSISTED LAPAROSCOPIC RADICAL PROSTATECTOMY  08/10/2011   Procedure: ROBOTIC ASSISTED LAPAROSCOPIC RADICAL PROSTATECTOMY LEVEL 2;  Surgeon: Dutch Gray, MD;  Location: [facility];  Service: Urology;  Laterality: N/A;  . THYROID SURGERY  age [age]        Home Medications    Prior to Admission medications   Medication Sig Start Date End Date Taking? Authorizing Provider  aspirin EC 81 MG EC tablet Take 1 tablet (81 mg total) by mouth daily. 06/04/15   Florencia Reasons, MD  benzonatate (TESSALON) 100 MG capsule Take 1 capsule (100 mg total) by mouth 3 (three) times daily as needed for cough. 07/27/16   Horton, Barbette Hair, MD  Cholecalciferol (VITAMIN D-3) 125 MCG (5000 UT) TABS Take 5,000 Units by mouth daily.    [provider]  Coenzyme Q10 (COQ10) 200 MG CAPS Take 200 mg by mouth daily.    [provider]  Icosapent Ethyl (VASCEPA) 1 g CAPS Take 2 capsules (  2 g total) by mouth 2 (two) times daily. 03/31/18   [name], Mihai, MD  MAGNESIUM-ZINC PO Take 1 tablet by mouth.    [provider]  metoprolol succinate (TOPROL-XL) 25 MG 24 hr tablet TAKE 1 TABLET ONCE DAILY. 06/29/16   [name], Mihai, MD  nitroGLYCERIN (NITROSTAT) 0.4 MG SL tablet 1 TAB UNDER TONGUE AS NEEDED FOR CHEST PAIN. MAY REPEAT EVERY 5 MIN FOR A TOTAL OF 3 DOSES. 01/06/17   [name], Mihai, MD  rosuvastatin (CRESTOR) 20 MG tablet Take 1 tablet (20 mg total) by mouth daily. 04/06/18   [name], Mihai, MD  rosuvastatin (CRESTOR) 5 MG tablet Take 5 mg by mouth daily.    [provider]  vitamin E 400  UNIT capsule Take 400 Units by mouth daily.    [provider]    Family History Family History  Problem Relation Age of Onset  . Prostate cancer Father 33       in his 70's/other comorbidities/79 deceased  . Breast cancer Mother 36       mets to bone,deceased age 38  . Cervical cancer Sister        2 sisters  . Cervical cancer Sister   . CAD Brother        Died of sudden cardiac death/MI    Social History Social History   Tobacco Use  . Smoking status: Never Smoker  . Smokeless tobacco: Never Used  Substance Use Topics  . Alcohol use: Yes    Comment: Occasional  . Drug use: No     Allergies   Other   Review of Systems Review of Systems  Constitutional: Negative for appetite change and fatigue.  HENT: Negative for congestion, ear discharge and sinus pressure.   Eyes: Negative for discharge.  Respiratory: Negative for cough.   Cardiovascular: Positive for chest pain and near-syncope.  Gastrointestinal: Negative for abdominal pain and diarrhea.  Genitourinary: Negative for frequency and hematuria.  Musculoskeletal: Negative for back pain.  Skin: Negative for rash.  Neurological: Negative for seizures and headaches.  Psychiatric/Behavioral: Negative for hallucinations.     Physical Exam Updated Vital Signs BP 107/75 (BP Location: Right Arm)   Pulse 65   Temp 98.2 F (36.8 C) (Oral)   Resp 14   Ht 6' (1.829 m)   Wt 99.8 kg   SpO2 97%   BMI 29.84 kg/m   Physical Exam Vitals signs reviewed.  Constitutional:      Appearance: He is well-developed.  HENT:     Head: Normocephalic.     Nose: Nose normal.  Eyes:     General: No scleral icterus.    Conjunctiva/sclera: Conjunctivae normal.  Neck:     Musculoskeletal: Neck supple.     Thyroid: No thyromegaly.  Cardiovascular:     Rate and Rhythm: Normal rate and regular rhythm.     Heart sounds: No murmur. No friction rub. No gallop.   Pulmonary:     Breath sounds: No stridor. No wheezing or  rales.  Chest:     Chest wall: No tenderness.  Abdominal:     General: There is no distension.     Tenderness: There is no abdominal tenderness. There is no rebound.  Musculoskeletal: Normal range of motion.  Lymphadenopathy:     Cervical: No cervical adenopathy.  Skin:    Findings: No erythema or rash.  Neurological:     Mental Status: He is oriented to person, place, and time.     Motor: No abnormal  muscle tone.     Coordination: Coordination normal.  Psychiatric:        Behavior: Behavior normal.      ED Treatments / Results  Labs (all labs ordered are listed, but only abnormal results are displayed) Labs Reviewed  CBC WITH DIFFERENTIAL/PLATELET - Abnormal; Notable for the following components:      Result Value   HCT 38.0 (*)    All other components within normal limits  COMPREHENSIVE METABOLIC PANEL - Abnormal; Notable for the following components:   CO2 21 (*)    Glucose, Bld 117 (*)    BUN 25 (*)    Alkaline Phosphatase 33 (*)    GFR calc non Af Amer 59 (*)    All other components within normal limits  I-STAT CHEM 8, ED - Abnormal; Notable for the following components:   BUN 26 (*)    Glucose, Bld 115 (*)    Calcium, Ion 1.10 (*)    Hemoglobin 11.9 (*)    HCT 35.0 (*)    All other components within normal limits  I-STAT TROPONIN, ED    EKG None  Radiology Dg Chest Port 1 View  Result Date: 04/26/2018 CLINICAL DATA:  Chest pain and ST depression during stress test EXAM: PORTABLE CHEST 1 VIEW COMPARISON:  07/26/2016 FINDINGS: Cardiac shadow is stable. Mild aortic calcifications are again seen and stable. The lungs are well aerated without focal infiltrate or sizable effusion. No bony abnormality is seen. IMPRESSION: No acute abnormality noted. Electronically Signed   By: Inez Catalina M.D.   On: 04/26/2018 12:36    Procedures Procedures (including critical care time)  Medications Ordered in ED Medications  nitroGLYCERIN (NITROGLYN) 2 % ointment 1 inch (1  inch Topical Given 04/26/18 1238)     Initial Impression / Assessment and Plan / ED Course  I have reviewed the triage vital signs and the nursing notes.  Pertinent labs & imaging results that were available during my care of the patient were reviewed by me and considered in my medical decision making (see chart for details).    Patient with coronary artery disease and positive stress test today.  Cardiology has been notified they will come see the patient  Final Clinical Impressions(s) / ED Diagnoses   Final diagnoses:  None    ED Discharge Orders    None       Milton Ferguson, MD 04/26/18 1337

## 2018-04-26 NOTE — ED Triage Notes (Signed)
Per EMS pt dr office from stress test, during test significant depression on 12 lead, pt almost passed out during the test and became pale. H/s of MI in 2017. SOB on exertion for 3 months.

## 2018-04-26 NOTE — Interval H&P Note (Signed)
Cath Lab Visit (complete for each Cath Lab visit)  Clinical Evaluation Leading to the Procedure:   ACS: No.  Non-ACS:    Anginal Classification: CCS III  Anti-ischemic medical therapy: Minimal Therapy (1 class of medications)  Non-Invasive Test Results: High-risk stress test findings: cardiac mortality >3%/year  Prior CABG: No previous CABG      History and Physical Interval Note:  04/26/2018 3:35 PM  Lawrence Hall  has presented today for surgery, with the diagnosis of chest pain  The various methods of treatment have been discussed with the patient and family. After consideration of risks, benefits and other options for treatment, the patient has consented to  Procedure(s): LEFT HEART CATH AND CORS/GRAFTS ANGIOGRAPHY (N/A) as a surgical intervention .  The patient's history has been reviewed, patient examined, no change in status, stable for surgery.  I have reviewed the patient's chart and labs.  Questions were answered to the patient's satisfaction.     Lawrence Hall

## 2018-04-27 ENCOUNTER — Inpatient Hospital Stay (HOSPITAL_COMMUNITY): Payer: PPO

## 2018-04-27 ENCOUNTER — Encounter (HOSPITAL_COMMUNITY): Payer: PPO

## 2018-04-27 ENCOUNTER — Encounter (HOSPITAL_COMMUNITY): Payer: Self-pay | Admitting: Cardiovascular Disease

## 2018-04-27 ENCOUNTER — Other Ambulatory Visit: Payer: Self-pay | Admitting: *Deleted

## 2018-04-27 DIAGNOSIS — R55 Syncope and collapse: Secondary | ICD-10-CM

## 2018-04-27 DIAGNOSIS — I2510 Atherosclerotic heart disease of native coronary artery without angina pectoris: Secondary | ICD-10-CM

## 2018-04-27 DIAGNOSIS — Z01810 Encounter for preprocedural cardiovascular examination: Secondary | ICD-10-CM

## 2018-04-27 DIAGNOSIS — I350 Nonrheumatic aortic (valve) stenosis: Secondary | ICD-10-CM

## 2018-04-27 LAB — BASIC METABOLIC PANEL
Anion gap: 6 (ref 5–15)
BUN: 20 mg/dL (ref 8–23)
CALCIUM: 8.4 mg/dL — AB (ref 8.9–10.3)
CHLORIDE: 110 mmol/L (ref 98–111)
CO2: 23 mmol/L (ref 22–32)
CREATININE: 1.15 mg/dL (ref 0.61–1.24)
GFR calc Af Amer: >60 mL/min (ref >60)
GFR calc non Af Amer: >60 mL/min (ref >60)
Glucose, Bld: 92 mg/dL (ref 70–99)
Potassium: 3.9 mmol/L (ref 3.5–5.1)
Sodium: 139 mmol/L (ref 135–145)

## 2018-04-27 LAB — SURGICAL PCR SCREEN
MRSA, PCR: NEGATIVE
Staphylococcus aureus: NEGATIVE

## 2018-04-27 LAB — CBC
HCT: 35.2 % — ABNORMAL LOW (ref 39.0–52.0)
Hemoglobin: 12.4 g/dL — ABNORMAL LOW (ref 13.0–17.0)
MCH: 30.2 pg (ref 26.0–34.0)
MCHC: 35.2 g/dL (ref 30.0–36.0)
MCV: 85.9 fL (ref 80.0–100.0)
Platelets: 193 10*3/uL (ref 150–400)
RBC: 4.1 MIL/uL — ABNORMAL LOW (ref 4.22–5.81)
RDW: 13.1 % (ref 11.5–15.5)
WBC: 6.1 10*3/uL (ref 4.0–10.5)
nRBC: 0 % (ref 0.0–0.2)

## 2018-04-27 LAB — URINALYSIS, ROUTINE W REFLEX MICROSCOPIC
Bilirubin Urine: NEGATIVE
Glucose, UA: NEGATIVE mg/dL
Hgb urine dipstick: NEGATIVE
Ketones, ur: NEGATIVE mg/dL
Leukocytes, UA: NEGATIVE
Nitrite: NEGATIVE
Protein, ur: NEGATIVE mg/dL
Specific Gravity, Urine: 1.018 (ref 1.005–1.030)
pH: 7 (ref 5.0–8.0)

## 2018-04-27 LAB — PULMONARY FUNCTION TEST
FEF 25-75 Pre: 2.7 L/sec
FEF2575-%Pred-Pre: 109 %
FEV1-%Pred-Pre: 77 %
FEV1-Pre: 2.61 L
FEV1FVC-%Pred-Pre: 112 %
FEV6-%Pred-Pre: 71 %
FEV6-Pre: 3.13 L
FEV6FVC-%Pred-Pre: 104 %
FVC-%Pred-Pre: 68 %
FVC-Pre: 3.17 L
Pre FEV1/FVC ratio: 82 %
Pre FEV6/FVC Ratio: 99 %

## 2018-04-27 LAB — ECHOCARDIOGRAM COMPLETE
Height: 72 in
Weight: 3600 oz

## 2018-04-27 LAB — TROPONIN I: Troponin I: <0.03 ng/mL (ref <0.03)

## 2018-04-27 MED ORDER — METOPROLOL SUCCINATE ER 25 MG PO TB24
12.5000 mg | ORAL_TABLET | Freq: Every day | ORAL | Status: DC
  Administered 2018-04-27: 12.5 mg via ORAL
  Filled 2018-04-27: qty 1

## 2018-04-27 NOTE — Progress Notes (Addendum)
Progress Note  Patient Name: Lawrence Hall Date of Encounter: 04/27/2018  Primary Cardiologist: Sanda Klein, MD   Subjective   No chest pain or SOB no complaints.  Inpatient Medications    Scheduled Meds: . aspirin  81 mg Oral Daily  . atorvastatin  80 mg Oral q1800  . heparin  5,000 Units Subcutaneous Q8H  . sodium chloride flush  3 mL Intravenous Q12H   Continuous Infusions: . sodium chloride 100 mL/hr at 04/27/18 0500  . sodium chloride     PRN Meds: sodium chloride, acetaminophen, diazepam, ondansetron (ZOFRAN) IV, sodium chloride flush   Vital Signs    Vitals:   04/26/18 1800 04/26/18 1937 04/27/18 0648 04/27/18 0808  BP: (!) 145/83 138/70 108/79 115/80  Pulse: 72 65 94 81  Resp: 14 14 19  (!) 24  Temp: 98.5 F (36.9 C) 97.9 F (36.6 C) (!) 97.4 F (36.3 C)   TempSrc: Oral Oral    SpO2: 100% 98%    Weight:   102.1 kg   Height:        Intake/Output Summary (Last 24 hours) at 04/27/2018 0957 Last data filed at 04/27/2018 0500 Gross per 24 hour  Intake 925.5 ml  Output -  Net 925.5 ml   Filed Weights   04/26/18 1217 04/27/18 0648  Weight: 99.8 kg 102.1 kg    Telemetry    SR - Personally Reviewed  ECG    No new  - Personally Reviewed  Physical Exam   GEN: No acute distress.   Neck: No JVD Cardiac: RRR, 3-9/5 systolic murmur heart into abd, no rubs, or gallops.  Respiratory: Clear to auscultation bilaterally. GI: Soft, nontender, non-distended  MS: No edema; No deformity. Neuro:  Nonfocal  Psych: Normal affect   Labs    Chemistry Recent Labs  Lab 04/26/18 1212 04/26/18 1232 04/27/18 0528  NA 136 137 139  K 4.2 4.2 3.9  CL 105 105 110  CO2 21*  --  23  GLUCOSE 117* 115* 92  BUN 25* 26* 20  CREATININE 1.20 1.20 1.15  CALCIUM 8.9  --  8.4*  PROT 7.0  --   --   ALBUMIN 3.9  --   --   AST 21  --   --   ALT 17  --   --   ALKPHOS 33*  --   --   BILITOT 1.0  --   --   GFRNONAA 59*  --  >60  GFRAA >60  --  >60  ANIONGAP 10  --  6      Hematology Recent Labs  Lab 04/26/18 1212 04/26/18 1232 04/27/18 0528  WBC 5.2  --  6.1  RBC 4.40  --  4.10*  HGB 13.0 11.9* 12.4*  HCT 38.0* 35.0* 35.2*  MCV 86.4  --  85.9  MCH 29.5  --  30.2  MCHC 34.2  --  35.2  RDW 13.0  --  13.1  PLT 202  --  193    Cardiac EnzymesNo results for input(s): TROPONINI in the last 168 hours.  Recent Labs  Lab 04/26/18 1231  TROPIPOC 0.00     BNPNo results for input(s): BNP, PROBNP in the last 168 hours.   DDimer No results for input(s): DDIMER in the last 168 hours.   Radiology    Dg Chest Port 1 View  Result Date: 04/26/2018 CLINICAL DATA:  Chest pain and ST depression during stress test EXAM: PORTABLE CHEST 1 VIEW COMPARISON:  07/26/2016 FINDINGS:  Cardiac shadow is stable. Mild aortic calcifications are again seen and stable. The lungs are well aerated without focal infiltrate or sizable effusion. No bony abnormality is seen. IMPRESSION: No acute abnormality noted. Electronically Signed   By: Inez Catalina M.D.   On: 04/26/2018 12:36    Cardiac Studies   Rest myoview due to complications with walk 12/22/79 Study Highlights    Pt walked on a Bruce protocol GXT for around 4-5 minutes.  He developed profound ECG changes and chest pain and the treadmill was stopped although the timer was not stopped.  He had profound ECG changes with exercise including deep ST depression in the inferior leads and ST elevation in aVR.  The resting myoview images do not show any significant defects.  Stress images could not be performed.  The patient developed severe CP and ECG changes and was rushed to the cath lab.     04/26/18  Prox LAD lesion is 50% stenosed.  Mid LM to Dist LM lesion is 70% stenosed.  Previously placed Prox Cx stent (unknown type) is widely patent.  Prox RCA to Mid RCA lesion is 75% stenosed.   Multivessel CAD with smooth eccentric 70% distal left main stenosis; 50% proximal LAD stenosis; patent left circumflex  stent; and 75% proximal RCA stenosis.  Normal right heart pressures.  Calcified aortic valve with markedly reduced excursion by aortography.  Moderate aortic stenosis with a peak instantaneous gradient of 36, peak to peak gradient of 31, and mean gradient of 26 mmHg with a valve area of 1.1 cm.  RECOMMENDATION: Surgical consultation for AVR/CABG revascularization.  Echo pending. Today   Patient Profile     73 y.o. male with history of mild to moderate aortic stenosis, hypertension, hyperlipidemia and coronary artery disease status post non-STEMI and drug-eluting stent to left circumflex coronary artery in 2017, recently returning with exertional dyspnea and exertional angina, with markedly abnormal stress test today with exercise-induced hypotension and marked ST segment changes.  Cath with significant disease and moderate AS.     Assessment & Plan    Significantly abnormal stress test with severe hypotension, and ST depression in multiple leads.  Near syncope.  Sent to [facility] by EMS for cardiac cath Rt and Lt.  - needs TCTS consult for significant CAD and AS.  I have called their office. They will see this afternoon.   Significant CAD see above and cardiac cath including 70 % distal LM stenosis. Troponin poc is neg.  will repeat this AM --on NTG ointment --ASA  --was on toprol XL as outpt 50 mg daily 12.5 mg ordered here will continue  Since HR is drifting up   Moderate AS per Rt heart cath, TTE is pending.   VTE prophylaxis with subcu heparin.     HLD on crestor 20 with LDL 69 in Dec. Now on lipitor 80    HTN benicar on hold Cr 1.15 today  BP this AM 108/79 and 115/80   For questions or updates, please contact [facility] Please consult [email or web address] for contact info under        Signed, Cecilie Kicks, NP  04/27/2018, 9:57 AM   ---------------------------------------------------------------------------------------------   History and all data above reviewed.   Patient examined.  I agree with the findings as above.  AKI ABALOS is a 73 yo male being evaluated for possible CABG +/- AVR by TCTS.  Constitutional: No acute distress Cardiovascular: regular rhythm, normal rate, 3/6 systolic ejection murmur, preserved S2. S1 and S2 normal.  Radial pulses normal bilaterally. No jugular venous distention.  Respiratory: clear to auscultation bilaterally GI : normal bowel sounds, soft and nontender. No distention.   MSK: extremities warm, well perfused. No edema.  NEURO: grossly nonfocal exam, moves all extremities. PSYCH: alert and oriented x 3, normal mood and affect.   All available labs, radiology testing, previous records reviewed. Agree with documented assessment and plan of my colleague as stated above with the following additions or changes:  Active Problems:   Nonrheumatic aortic valve stenosis   Near syncope   CAD (coronary artery disease), native coronary artery    Plan: await decision about surgery and whether AV will be replaced at the same time. I have answered the patients questions to the best of my ability. Remainder per L. Ingold NP as above.   Length of Stay:  LOS: 2 days   Elouise Munroe, MD [facility]

## 2018-04-27 NOTE — Research (Signed)
[facility] Research study protocol review with patient and wife. Patient seems interested. ICF and Pamphlet for Study left for their review. Research will follow up tomorrow.

## 2018-04-27 NOTE — Progress Notes (Signed)
Respiratory Note: Attempted to perform bedside PFT testing; when patient performed first FVC maneuver, patient lost consciousness and started shaking which lasted approximately 20-30 seconds.  HR was in 80's SaO2 90-96% on room air. Patient regained consciousness. Barb Merino, RN, arrived to patient's room.  Patient alert and oriented, stated he  had no chest pain and that he felt fine. Levonne Spiller, CVTS RN informed of the event. PFT not completed.

## 2018-04-27 NOTE — Progress Notes (Signed)
Was called into patient's room regarding LOC and seizure like activity. Upon assessment patient was alert and oriented. Denied any complaints. Vital signs were stable. Rapid Response nurse and RT was in room prior to my arrival. See RT note of details. Will continue to monitor.

## 2018-04-27 NOTE — Progress Notes (Signed)
Pre CABG evaluation completed. Please see preliminary notes on CV PROC under chart review. Shona Pardo H Thara Searing(RDMS RVT) 04/27/18 3:00 PM

## 2018-04-27 NOTE — Plan of Care (Signed)
  Problem: Spiritual Needs Goal: Ability to function at adequate level Outcome: Progressing   

## 2018-04-27 NOTE — Progress Notes (Signed)
  Echocardiogram 2D Echocardiogram has been performed.  Lawrence Hall 04/27/2018, 10:04 AM

## 2018-04-27 NOTE — Consult Note (Addendum)
[address]       [place] [address]             [phone]        Lawrence Hall [facility] Medical Record #[id number] Date of Birth: 1946/02/21  Referring: No ref. provider found Primary Care: Fanny Bien, MD Primary Cardiologist:Mihai [name], MD  Chief Complaint:    Chief Complaint  Patient presents with  . Near Syncope    History of Present Illness:       Lawrence Hall is a 73 year old male patient with a past medical hisotry significant for prostate cancer, hyperlipidemia, hypercholesterolemia, basal cell carcinoma, asthma, arthritis, and know CAD (DES placed 05/2015 to the proximal left circumflex) who was sent to the ED after a failed stress test. During the test he developed near syncope due to severe hypotension and had marked ST segment depression in multiple lead and ST segment elevation in leave aVR. He had slowly progressing exertional dyspnea with decreasing exercise tolerance. Cardiac catheterization was performed on 1/7 and showed 50% stenosis of the proximal LAD, significant stenosis of the mid to distal left main, and 75% stenosis of the proximal to mid RCA.  His previously placed stent in the proximal circumflex was widely patent.  There was moderate aortic stenosis per right heart cath.  Echocardiogram was recently performed but the report is still pending.  We are being consulted for possible revascularization and aortic valve repair/replacement.  Of note, he has had extensive dental work done in the past which involved extraction of most of his teeth and placement of permanent dentures. His most recent dental work was performed 3 months ago and he has not had any issues since.   Current Activity/ Functional Status: Patient was independent with mobility/ambulation, transfers, ADL's, IADL's.   Zubrod Score: At the time of surgery this patient's most appropriate activity status/level should be described as: []     0    Normal activity, no  symptoms [x]     1    Restricted in physical strenuous activity but ambulatory, able to do out light work []     2    Ambulatory and capable of self care, unable to do work activities, up and about                 more than 50%  Of the time                            []     3    Only limited self care, in bed greater than 50% of waking hours []     4    Completely disabled, no self care, confined to bed or chair []     5    Moribund  Past Medical History:  Diagnosis Date  . Arthritis    mild hands  . Asthma   . Cataract    early signs b/l eyes  . Dysrhythmia    PAC'S  . ED (erectile dysfunction)   . H/O hiatal hernia   . Hypercholesterolemia   . Hypertension    MARGINALLY HIGH - NO MEDS -MONITORS  . Prostate cancer ([place]) 03/27/2011   prostate bx=adenocarcinoma,  . Skin cancer   . Skin cancer 2008   basal cell face /removed  . Sleep apnea    USES NOSTRIL APARATUS - NO C-PAP- "BORDERLINE SLEEP APMNEA"    Past Surgical History:  Procedure  Laterality Date  . basal cell cancer  2008   inside nose  . CARDIAC CATHETERIZATION N/A 06/03/2015   Procedure: Left Heart Cath and Coronary Angiography;  Surgeon: [name] Sine, MD;  Location: [facility];  Service: Cardiovascular;  Laterality: N/A;  . RIGHT/LEFT HEART CATH AND CORONARY ANGIOGRAPHY N/A 04/26/2018   Procedure: RIGHT/LEFT HEART CATH AND CORONARY ANGIOGRAPHY;  Surgeon: [name] Sine, MD;  Location: [facility];  Service: Cardiovascular;  Laterality: N/A;  . ROBOT ASSISTED LAPAROSCOPIC RADICAL PROSTATECTOMY  08/10/2011   Procedure: ROBOTIC ASSISTED LAPAROSCOPIC RADICAL PROSTATECTOMY LEVEL 2;  Surgeon: Dutch Gray, MD;  Location: [facility];  Service: Urology;  Laterality: N/A;  . THYROID SURGERY  age [age]    Social History   Tobacco Use  Smoking Status Never Smoker  Smokeless Tobacco Never Used    Social History   Substance and Sexual Activity  Alcohol Use Yes   Comment: Occasional     Allergies  Allergen  Reactions  . Other Itching and Other (See Comments)    Pet dander, seasonal allergies = Runny nose, itchy eyes    Current Facility-Administered Medications  Medication Dose Route Frequency Provider Last Rate Last Dose  . 0.9 %  sodium chloride infusion   Intravenous Continuous [name] Sine, MD 100 mL/hr at 04/27/18 0500    . 0.9 %  sodium chloride infusion  250 mL Intravenous PRN [name] Sine, MD      . acetaminophen (TYLENOL) tablet 650 mg  650 mg Oral Q4H PRN [name] Sine, MD   650 mg at 04/26/18 2149  . aspirin chewable tablet 81 mg  81 mg Oral Daily [name] Sine, MD   81 mg at 04/27/18 1044  . atorvastatin (LIPITOR) tablet 80 mg  80 mg Oral q1800 [name] Sine, MD      . diazepam (VALIUM) tablet 5 mg  5 mg Oral Q4H PRN [name] Sine, MD   5 mg at 04/26/18 2149  . heparin injection 5,000 Units  5,000 Units Subcutaneous Q8H [name] Sine, MD   5,000 Units at 04/27/18 0541  . metoprolol succinate (TOPROL-XL) 24 hr tablet 12.5 mg  12.5 mg Oral Daily Cecilie Kicks R, NP   12.5 mg at 04/27/18 1044  . ondansetron (ZOFRAN) injection 4 mg  4 mg Intravenous Q6H PRN [name] Sine, MD      . sodium chloride flush (NS) 0.9 % injection 3 mL  3 mL Intravenous Q12H [name] A, MD      . sodium chloride flush (NS) 0.9 % injection 3 mL  3 mL Intravenous PRN [name] Sine, MD        Medications Prior to Admission  Medication Sig Dispense Refill Last Dose  . aspirin EC 81 MG EC tablet Take 1 tablet (81 mg total) by mouth daily. 30 tablet 0 04/26/2018 at 0700  . Cholecalciferol (VITAMIN D-3) 125 MCG (5000 UT) TABS Take 5,000 Units by mouth daily.   04/26/2018 at am  . Coenzyme Q10 (COQ10) 200 MG CAPS Take 200 mg by mouth daily.   04/26/2018 at am  . MAGNESIUM-ZINC PO Take 1 tablet by mouth daily.    04/26/2018 at Unknown time  . MEGARED OMEGA-3 KRILL OIL 500 MG CAPS Take 1 capsule by mouth daily.    04/26/2018 at Unknown time  . metoprolol succinate (TOPROL-XL) 50 MG 24 hr tablet Take 50  mg by mouth daily. Take with or immediately following a meal.  04/25/2018 at 0800  . nitroGLYCERIN (NITROSTAT) 0.4 MG SL tablet 1 TAB UNDER TONGUE AS NEEDED FOR CHEST PAIN. MAY REPEAT EVERY 5 MIN FOR A TOTAL OF 3 DOSES. (Patient taking differently: Place 0.4 mg under the tongue every 5 (five) minutes x 3 doses as needed for chest pain. ) 25 tablet 3 unk at unk  . olmesartan (BENICAR) 20 MG tablet Take 20 mg by mouth daily.   04/26/2018 at am  . rosuvastatin (CRESTOR) 20 MG tablet Take 1 tablet (20 mg total) by mouth daily. 90 tablet 3 04/26/2018 at am  . rosuvastatin (CRESTOR) 5 MG tablet Take 5 mg by mouth daily.   04/26/2018 at am  . vitamin E 400 UNIT capsule Take 400 Units by mouth daily.   04/26/2018 at am  . benzonatate (TESSALON) 100 MG capsule Take 1 capsule (100 mg total) by mouth 3 (three) times daily as needed for cough. (Patient not taking: Reported on 04/26/2018) 21 capsule 0 Not Taking at Unknown time  . Icosapent Ethyl (VASCEPA) 1 g CAPS Take 2 capsules (2 g total) by mouth 2 (two) times daily. (Patient not taking: Reported on 04/26/2018) 360 capsule 3 Not Taking at Unknown time  . metoprolol succinate (TOPROL-XL) 25 MG 24 hr tablet TAKE 1 TABLET ONCE DAILY. (Patient not taking: Reported on 04/26/2018) 30 tablet 10 Not Taking at Unknown time    Family History  Problem Relation Age of Onset  . Prostate cancer Father 66       in his 70's/other comorbidities/79 deceased  . Breast cancer Mother 74       mets to bone,deceased age 79  . Cervical cancer Sister        2 sisters  . Cervical cancer Sister   . CAD Brother        Died of sudden cardiac death/MI     Review of Systems:   Review of Systems  Constitutional: Negative for chills, fever and malaise/fatigue.  Respiratory: Positive for shortness of breath. Negative for cough and sputum production.   Cardiovascular: Positive for leg swelling. Negative for chest pain and palpitations.  Gastrointestinal: Negative for heartburn, nausea and  vomiting.  Neurological: Positive for loss of consciousness.   Pertinent items are noted in HPI.    Physical Exam: BP (!) 143/74   Pulse 80   Temp (!) 97.4 F (36.3 C)   Resp (!) 24   Ht 6' (1.829 m)   Wt 102.1 kg   SpO2 98%   BMI 30.52 kg/m    General appearance: alert, cooperative and no distress Resp: clear to auscultation bilaterally Cardio: regular rate and rhythm, S1, S2 normal, no murmur, click, rub or gallop GI: soft, non-tender; bowel sounds normal; no masses,  no organomegaly Extremities: extremities normal, atraumatic, no cyanosis or edema Neurologic: Grossly normal  Diagnostic Studies & Laboratory data:   Prox LAD lesion is 50% stenosed.  Mid LM to Dist LM lesion is 70% stenosed.  Previously placed Prox Cx stent (unknown type) is widely patent.  Prox RCA to Mid RCA lesion is 75% stenosed.   Multivessel CAD with smooth eccentric 70% distal left main stenosis; 50% proximal LAD stenosis; patent left circumflex stent; and 75% proximal RCA stenosis.  Normal right heart pressures.  Calcified aortic valve with markedly reduced excursion by aortography.  Moderate aortic stenosis with a peak instantaneous gradient of 36, peak to peak gradient of 31, and mean gradient of 26 mmHg with a valve area of 1.1 cm.  RECOMMENDATION: Surgical  consultation for AVR/CABG revascularization.      Recent Radiology Findings:   Dg Chest Port 1 View  Result Date: 04/26/2018 CLINICAL DATA:  Chest pain and ST depression during stress test EXAM: PORTABLE CHEST 1 VIEW COMPARISON:  07/26/2016 FINDINGS: Cardiac shadow is stable. Mild aortic calcifications are again seen and stable. The lungs are well aerated without focal infiltrate or sizable effusion. No bony abnormality is seen. IMPRESSION: No acute abnormality noted. Electronically Signed   By: Inez Catalina M.D.   On: 04/26/2018 12:36     I have independently reviewed the above radiologic studies and discussed with the  patient   Recent Lab Findings: Lab Results  Component Value Date   WBC 6.1 04/27/2018   HGB 12.4 (L) 04/27/2018   HCT 35.2 (L) 04/27/2018   PLT 193 04/27/2018   GLUCOSE 92 04/27/2018   CHOL 223 (H) 06/01/2015   TRIG 258 (H) 06/01/2015   HDL 45 06/01/2015   LDLCALC 126 (H) 06/01/2015   ALT 17 04/26/2018   AST 21 04/26/2018   NA 139 04/27/2018   K 3.9 04/27/2018   CL 110 04/27/2018   CREATININE 1.15 04/27/2018   BUN 20 04/27/2018   CO2 23 04/27/2018   TSH 4.147 06/01/2015   INR 1.13 05/31/2015      Assessment / Plan:      1.  Severe multivessel coronary artery disease-catheterization report listed above.  Continue medical therapy including aspirin, statin, metoprolol.  Continue nitroglycerin ointment for chest pain.  Currently on heparin subcutaneous every 8 hours. 2.  Moderate aortic stenosis-per cardiac catheterization.  Echocardiogram pending. 3.  Hyperlipidemia-continue statin therapy 4. Hypertension-On Benicar at home 20 mg daily. Not currently on treatment.  5. Asthma-not currently on any inhaled medications 6. Arthritis-not on NSAID therapy at this time  Plan: Will likely need CABG and AVR. Dr. Prescott Gum to evaluate. Tentatively scheduled for Friday. Reviewed the procedure in detail with the patient and wife at the bedside. All questions were answered to their satisfaction.   I  spent 40 minutes counseling the patient face to face.   Nicholes Rough, PA-C  04/27/2018 11:44 AM  Examined the patient and reviewed personally his images coronary angiogram, echocardiogram, chest CT scan, chest x-ray.  The patient resented with tolerating angina and a very positive stress test.  He was immediately hospitalized and underwent testing which revealed significant left main stenosis-three-vessel CAD and moderate aortic stenosis with preserved LV systolic function.  Would benefit from combined AVR-CABG.  I discussed the details of this procedure in depth with the patient and wife.  He  understands the expected benefits of improved symptoms, preservation of LV function, improved survival.  He understands the potential risks of stroke, bleeding, organ failure, infection, blood transfusion, and death.  He understands at age 58 that a bioprosthetic valve for his AVR would be best practice.  Proceed with surgery in a.m January 10. Dahlia Byes MD

## 2018-04-27 NOTE — Progress Notes (Signed)
   04/27/18 1426  Clinical Encounter Type  Visited With Patient not available  Visit Type Follow-up   Returned to f/u with pt and spouse re AD, but no one present in room at this time.  Pls call or create spiritual care consult if pt has questions or is ready to complete paperwork.  Please indicate if paperwork needs to be notarized d/t impending procedure.  Otherwise, ADs are usually notarized M-F, 9am-3pm.  Myra Gianotti resident, [phone]

## 2018-04-27 NOTE — Progress Notes (Signed)
   04/27/18 1400  Clinical Encounter Type  Visited With Family;Patient and family together;Health care provider  Visit Type Initial;Other (Comment) (AD request)  Referral From Nurse  Spiritual Encounters  Spiritual Needs Emotional;Literature  Stress Factors  Patient Stress Factors Health changes  Family Stress Factors Loss of control   Met w/ pt and his spouse in room, brought AD packet to review, began going over, then excused myself when NP came.  Will return later today; spouse has # to call spiritual care.  Myra Gianotti resident, [phone]

## 2018-04-28 LAB — PREPARE RBC (CROSSMATCH)

## 2018-04-28 LAB — ABO/RH: ABO/RH(D): A POS

## 2018-04-28 LAB — T4, FREE: Free T4: 0.74 ng/dL — ABNORMAL LOW (ref 0.82–1.77)

## 2018-04-28 LAB — PROTIME-INR
INR: 1.07
Prothrombin Time: 13.8 seconds (ref 11.4–15.2)

## 2018-04-28 LAB — HEPATIC FUNCTION PANEL
ALT: 16 U/L (ref 0–44)
AST: 17 U/L (ref 15–41)
Albumin: 3.6 g/dL (ref 3.5–5.0)
Alkaline Phosphatase: 33 U/L — ABNORMAL LOW (ref 38–126)
Bilirubin, Direct: 0.2 mg/dL (ref 0.0–0.2)
Indirect Bilirubin: 1 mg/dL — ABNORMAL HIGH (ref 0.3–0.9)
Total Bilirubin: 1.2 mg/dL (ref 0.3–1.2)
Total Protein: 6.8 g/dL (ref 6.5–8.1)

## 2018-04-28 LAB — TSH: TSH: 5.037 u[IU]/mL — ABNORMAL HIGH (ref 0.350–4.500)

## 2018-04-28 LAB — HEMOGLOBIN A1C
Hgb A1c MFr Bld: 5.4 % (ref 4.8–5.6)
Mean Plasma Glucose: 108.28 mg/dL

## 2018-04-28 MED ORDER — METOPROLOL TARTRATE 12.5 MG HALF TABLET
12.5000 mg | ORAL_TABLET | Freq: Once | ORAL | Status: AC
  Administered 2018-04-29: 12.5 mg via ORAL
  Filled 2018-04-28: qty 1

## 2018-04-28 MED ORDER — SODIUM CHLORIDE 0.9 % IV SOLN
INTRAVENOUS | Status: DC
  Filled 2018-04-28: qty 30

## 2018-04-28 MED ORDER — ALPRAZOLAM 0.25 MG PO TABS: 0.2500 mg | ORAL_TABLET | ORAL | Status: DC | PRN

## 2018-04-28 MED ORDER — TRANEXAMIC ACID (OHS) BOLUS VIA INFUSION
15.0000 mg/kg | INTRAVENOUS | Status: AC
  Administered 2018-04-29: 1528.5 mg via INTRAVENOUS
  Filled 2018-04-28: qty 1529

## 2018-04-28 MED ORDER — BISACODYL 5 MG PO TBEC: 5.0000 mg | DELAYED_RELEASE_TABLET | Freq: Once | ORAL | Status: DC

## 2018-04-28 MED ORDER — CHLORHEXIDINE GLUCONATE 4 % EX LIQD
60.0000 mL | Freq: Once | CUTANEOUS | Status: AC
  Administered 2018-04-28: 4 via TOPICAL
  Filled 2018-04-28: qty 60

## 2018-04-28 MED ORDER — DEXMEDETOMIDINE HCL IN NACL 400 MCG/100ML IV SOLN
0.1000 ug/kg/h | INTRAVENOUS | Status: DC
  Filled 2018-04-28: qty 100

## 2018-04-28 MED ORDER — CHLORHEXIDINE GLUCONATE 0.12 % MT SOLN
15.0000 mL | Freq: Two times a day (BID) | OROMUCOSAL | Status: DC
  Administered 2018-04-28 (×2): 15 mL via OROMUCOSAL
  Filled 2018-04-28 (×2): qty 15

## 2018-04-28 MED ORDER — CHLORHEXIDINE GLUCONATE 4 % EX LIQD
60.0000 mL | Freq: Once | CUTANEOUS | Status: AC
  Administered 2018-04-29: 4 via TOPICAL

## 2018-04-28 MED ORDER — TRANEXAMIC ACID 1000 MG/10ML IV SOLN
1.5000 mg/kg/h | INTRAVENOUS | Status: AC
  Administered 2018-04-29: 1.5 mg/kg/h via INTRAVENOUS
  Filled 2018-04-28: qty 25

## 2018-04-28 MED ORDER — PLASMA-LYTE 148 IV SOLN
INTRAVENOUS | Status: DC
  Filled 2018-04-28: qty 2.5

## 2018-04-28 MED ORDER — VANCOMYCIN HCL 10 G IV SOLR
1500.0000 mg | INTRAVENOUS | Status: AC
  Administered 2018-04-29: 1500 mg via INTRAVENOUS
  Filled 2018-04-28: qty 1500

## 2018-04-28 MED ORDER — TRANEXAMIC ACID (OHS) PUMP PRIME SOLUTION
2.0000 mg/kg | INTRAVENOUS | Status: DC
  Filled 2018-04-28: qty 2.04

## 2018-04-28 MED ORDER — DIAZEPAM 5 MG PO TABS
5.0000 mg | ORAL_TABLET | Freq: Once | ORAL | Status: AC
  Administered 2018-04-29: 5 mg via ORAL
  Filled 2018-04-28: qty 1

## 2018-04-28 MED ORDER — PHENYLEPHRINE HCL-NACL 20-0.9 MG/250ML-% IV SOLN
30.0000 ug/min | INTRAVENOUS | Status: DC
  Filled 2018-04-28: qty 250

## 2018-04-28 MED ORDER — INSULIN REGULAR(HUMAN) IN NACL 100-0.9 UT/100ML-% IV SOLN
INTRAVENOUS | Status: DC
  Filled 2018-04-28: qty 100

## 2018-04-28 MED ORDER — DOPAMINE-DEXTROSE 3.2-5 MG/ML-% IV SOLN
0.0000 ug/kg/min | INTRAVENOUS | Status: DC
  Filled 2018-04-28: qty 250

## 2018-04-28 MED ORDER — METOPROLOL SUCCINATE ER 25 MG PO TB24
25.0000 mg | ORAL_TABLET | Freq: Every day | ORAL | Status: DC
  Administered 2018-04-28: 25 mg via ORAL
  Filled 2018-04-28: qty 1

## 2018-04-28 MED ORDER — EPINEPHRINE PF 1 MG/ML IJ SOLN
0.0000 ug/min | INTRAVENOUS | Status: DC
  Filled 2018-04-28: qty 4

## 2018-04-28 MED ORDER — CHLORHEXIDINE GLUCONATE 0.12 % MT SOLN
15.0000 mL | Freq: Once | OROMUCOSAL | Status: AC
  Administered 2018-04-29: 15 mL via OROMUCOSAL
  Filled 2018-04-28: qty 15

## 2018-04-28 MED ORDER — SODIUM CHLORIDE 0.9 % IV SOLN
750.0000 mg | INTRAVENOUS | Status: DC
  Filled 2018-04-28: qty 750

## 2018-04-28 MED ORDER — MAGNESIUM SULFATE 50 % IJ SOLN
40.0000 meq | INTRAMUSCULAR | Status: DC
  Filled 2018-04-28: qty 9.85

## 2018-04-28 MED ORDER — SODIUM CHLORIDE 0.9 % IV SOLN
1.5000 g | INTRAVENOUS | Status: AC
  Administered 2018-04-29: 1.5 g via INTRAVENOUS
  Administered 2018-04-29: .75 g via INTRAVENOUS
  Filled 2018-04-28: qty 1.5

## 2018-04-28 MED ORDER — TEMAZEPAM 15 MG PO CAPS
15.0000 mg | ORAL_CAPSULE | Freq: Once | ORAL | Status: AC | PRN
  Administered 2018-04-29: 15 mg via ORAL
  Filled 2018-04-28: qty 1

## 2018-04-28 MED ORDER — POTASSIUM CHLORIDE 2 MEQ/ML IV SOLN
80.0000 meq | INTRAVENOUS | Status: DC
  Filled 2018-04-28: qty 40

## 2018-04-28 MED ORDER — NITROGLYCERIN IN D5W 200-5 MCG/ML-% IV SOLN
2.0000 ug/min | INTRAVENOUS | Status: DC
  Filled 2018-04-28: qty 250

## 2018-04-28 MED ORDER — MILRINONE LACTATE IN DEXTROSE 20-5 MG/100ML-% IV SOLN
0.3000 ug/kg/min | INTRAVENOUS | Status: AC
  Administered 2018-04-29: .25 ug/kg/min via INTRAVENOUS
  Filled 2018-04-28: qty 100

## 2018-04-28 NOTE — Progress Notes (Signed)
[id number] Did not walk with pt as he just walked. Gave [facility] booklet, care guide and in the tube handout. Discussed importance of walking and IS after surgery. Pt had IS already and he was able to get to 1500 ml correctly with cues. Encouraged him not to overdo with IS since he had an episode yesterday with PFTs. Wrote down how to view pre op video. Discussed staying in the tube and sternal precautions. Questions answered and understanding voiced by pt and wife. Graylon Good RN BSN 04/28/2018 11:44 AM

## 2018-04-28 NOTE — Progress Notes (Signed)
Chaplain Note:  Met with pt. Per pts request. I have known pt. From previous contact in outpatient setting.  Pt is concerned about how his medical condition and recovery will add stress to his wife, who already has care taking responsibilities for her mother. He expressed awareness that his niece will be a support to her during this time. He also expressed a sense of gratitude for the his wife and their 45 year marriage..   He requested that I pray for him and his wife and that I  check on his wife during his surgery indicating that she may well be alone.   Offered a prayer for him and assured him that I or a member of our chaplaincy staff will check on his wife tomorrow.   [place], Robert E Chaplain #[phone] 

## 2018-04-28 NOTE — Anesthesia Preprocedure Evaluation (Addendum)
Anesthesia Evaluation  Patient identified by MRN, date of birth, ID band Patient awake    Reviewed: Allergy & Precautions, NPO status , Patient's Chart, lab work & pertinent test results  Airway Mallampati: IV  TM Distance: >3 FB Neck ROM: Full    Dental no notable dental hx.    Pulmonary asthma , sleep apnea ,    Pulmonary exam normal breath sounds clear to auscultation       Cardiovascular hypertension, Pt. on home beta blockers and Pt. on medications + CAD  + Valvular Problems/Murmurs AS  Rhythm:Regular Rate:Normal + Systolic murmurs ECHO: Left ventricle: The cavity size was normal. Wall thickness was normal. Systolic function was vigorous. The estimated ejection fraction was in the range of 65% to 70%. Although no diagnostic regional wall motion abnormality was identified, this possibility cannot be completely excluded on the basis of this study. Doppler parameters are consistent with abnormal left ventricular relaxation (grade 1 diastolic dysfunction). Aortic valve: Trileaflet; moderately calcified leaflets. There was moderate stenosis. Mean gradient (S): 26 mm Hg. Peak gradient (S): 50 mm Hg. Valve area (VTI): 1.33 cm^2. Mitral valve: Mildly calcified annulus. Mildly calcified leaflets.There was no significant regurgitation. Right ventricle: The cavity size was normal. Systolic function was normal.Pulmonary arteries: No complete TR doppler jet so unable to estimate PA systolic pressure. Systemic veins: IVC measured 2.3 cm with > 50% respirophasic variation, suggesting RA pressure 8 mmHg.  CATH: Prox LAD lesion is 50% stenosed. Mid LM to Dist LM lesion is 70% stenosed. Previously placed Prox Cx stent (unknown type) is widely patent. Prox RCA to Mid RCA lesion is 75% stenosed. Multivessel CAD with smooth eccentric 70% distal left main stenosis; 50% proximal LAD stenosis; patent left circumflex stent; and 75% proximal RCA  stenosis. Normal right heart pressures. Calcified aortic valve with markedly reduced excursion by aortography. Moderate aortic stenosis with a peak instantaneous gradient of 36, peak to peak gradient of 31, and mean gradient of 26 mmHg with a valve area of 1.1 cm.    Neuro/Psych negative neurological ROS     GI/Hepatic Neg liver ROS, hiatal hernia,   Endo/Other  negative endocrine ROS  Renal/GU negative Renal ROS     Musculoskeletal negative musculoskeletal ROS (+)   Abdominal (+) + obese,   Peds  Hematology  (+) anemia , HLD   Anesthesia Other Findings CAD AS  Reproductive/Obstetrics                            Anesthesia Physical Anesthesia Plan  ASA: IV  Anesthesia Plan:    Post-op Pain Management:    Induction: Intravenous  PONV Risk Score and Plan: 2 and Midazolam and Treatment may vary due to age or medical condition  Airway Management Planned: Oral ETT  Additional Equipment: Arterial line, CVP, PA Cath, TEE and Ultrasound Guidance Line Placement  Intra-op Plan:   Post-operative Plan: Post-operative intubation/ventilation  Informed Consent:   Plan Discussed with:   Anesthesia Plan Comments:         Anesthesia Quick Evaluation

## 2018-04-28 NOTE — Progress Notes (Signed)
DENTAL CONSULTATION  Date of Consultation:  04/28/2018 Patient Name:   Lawrence Hall Date of Birth:   [birth year] Medical Record Number: [id number]  VITALS: BP (!) 151/76 (BP Location: Right Arm)   Pulse 68   Temp 97.7 F (36.5 C) (Oral)   Resp 13   Ht 6' (1.829 m)   Wt 101.9 kg   SpO2 98%   BMI 30.46 kg/m   CHIEF COMPLAINT: The patient was referred by Dr. Tharon Aquas Trigt for dental consultation.  HPI: Lawrence Hall is a 73 year old male recently diagnosed with coronary disease and aortic valve disease.  Patient with anticipated coronary artery bypass graft procedure along with aortic valve replacement or repair.  The patient is now seen as part of a pre-heart valve surgery dental protocol examination to rule out dental infection that may affect the patient stomach health and anticipated heart valve surgery.  The patient recently had an upper partial and a lower complete denture inserted approximately 3 months ago.  This was with Dr. Ronnald Ramp and Bing Plume.  Patient denies having any problems with the dentures at this time.  Patient in case that the dentures  "fit fine." The patient also has a history of previous periodontal therapy denies having any significant tooth mobility.  Patient denies having any dental phobia.  PROBLEM LIST: Patient Active Problem List   Diagnosis Date Noted  . CAD (coronary artery disease), native coronary artery 04/26/2018  . Near syncope   . CAD (coronary artery disease) 06/12/2015  . NSTEMI (non-ST elevated myocardial infarction) ([place])   . Nonrheumatic aortic valve stenosis   . Non-STEMI (non-ST elevated myocardial infarction) ([place])   . Essential hypertension 06/01/2015  . Asthma 05/31/2015  . Elevated troponin 05/31/2015  . Chest pain 07/25/2012  . Arthritis   . Hyperlipidemia   . Hypercholesterolemia   . Skin cancer   . ED (erectile dysfunction)   . Prostate cancer ([place]) 03/27/2011    PMH: Past Medical History:  Diagnosis Date  . Arthritis    mild hands  . Asthma   . Cataract    early signs b/l eyes  . Dysrhythmia    PAC'S  . ED (erectile dysfunction)   . H/O hiatal hernia   . Hypercholesterolemia   . Hypertension    MARGINALLY HIGH - NO MEDS -MONITORS  . Prostate cancer ([employer] [facility]) 03/27/2011   prostate bx=adenocarcinoma,  . Skin cancer   . Skin cancer 2008   basal cell face /removed  . Sleep apnea    USES NOSTRIL APARATUS - NO C-PAP- "BORDERLINE SLEEP APMNEA"    PSH: Past Surgical History:  Procedure Laterality Date  . basal cell cancer  2008   inside nose  . CARDIAC CATHETERIZATION N/A 06/03/2015   Procedure: Left Heart Cath and Coronary Angiography;  Surgeon: [name] Sine, MD;  Location: [facility];  Service: Cardiovascular;  Laterality: N/A;  . RIGHT/LEFT HEART CATH AND CORONARY ANGIOGRAPHY N/A 04/26/2018   Procedure: RIGHT/LEFT HEART CATH AND CORONARY ANGIOGRAPHY;  Surgeon: [name] Sine, MD;  Location: [facility];  Service: Cardiovascular;  Laterality: N/A;  . ROBOT ASSISTED LAPAROSCOPIC RADICAL PROSTATECTOMY  08/10/2011   Procedure: ROBOTIC ASSISTED LAPAROSCOPIC RADICAL PROSTATECTOMY LEVEL 2;  Surgeon: Dutch Gray, MD;  Location: [facility];  Service: Urology;  Laterality: N/A;  . THYROID SURGERY  age [age]    ALLERGIES: Allergies  Allergen Reactions  . Other Itching and Other (See Comments)    Pet dander, seasonal allergies = Runny nose, itchy  eyes    MEDICATIONS: Current Facility-Administered Medications  Medication Dose Route Frequency Provider Last Rate Last Dose  . 0.9 %  sodium chloride infusion   Intravenous Continuous [name] Sine, MD 125 mL/hr at 04/28/18 1040    . 0.9 %  sodium chloride infusion  250 mL Intravenous PRN [name] Sine, MD      . acetaminophen (TYLENOL) tablet 650 mg  650 mg Oral Q4H PRN [name] Sine, MD   650 mg at 04/26/18 2149  . aspirin chewable tablet 81 mg  81 mg Oral Daily [name] Sine, MD   81 mg at 04/28/18 1036  . atorvastatin (LIPITOR) tablet 80 mg   80 mg Oral q1800 [name] Sine, MD   80 mg at 04/27/18 1803  . [START ON 04/29/2018] cefUROXime (ZINACEF) 1.5 g in sodium chloride 0.9 % 100 mL IVPB  1.5 g Intravenous To OR Prescott Gum, Collier Salina, MD      . Derrill Memo ON 04/29/2018] cefUROXime (ZINACEF) 750 mg in sodium chloride 0.9 % 100 mL IVPB  750 mg Intravenous To OR Prescott Gum, Collier Salina, MD      . Derrill Memo ON 04/29/2018] dexmedetomidine (PRECEDEX) 400 MCG/100ML (4 mcg/mL) infusion  0.1-0.7 mcg/kg/hr Intravenous To OR Prescott Gum, Collier Salina, MD      . diazepam (VALIUM) tablet 5 mg  5 mg Oral Q4H PRN [name] Sine, MD   5 mg at 04/27/18 2209  . [START ON 04/29/2018] DOPamine (INTROPIN) 800 mg in dextrose 5 % 250 mL (3.2 mg/mL) infusion  0-10 mcg/kg/min Intravenous To OR Prescott Gum, Collier Salina, MD      . Derrill Memo ON 04/29/2018] EPINEPHrine (ADRENALIN) 4 mg in dextrose 5 % 250 mL (0.016 mg/mL) infusion  0-10 mcg/min Intravenous To OR Prescott Gum, Collier Salina, MD      . Derrill Memo ON 04/29/2018] heparin 2,500 Units, papaverine 30 mg in electrolyte-148 (PLASMALYTE-148) 500 mL irrigation   Irrigation To OR Prescott Gum, Collier Salina, MD      . Derrill Memo ON 04/29/2018] heparin 30,000 units/NS 1000 mL solution for CELLSAVER   Other To OR Prescott Gum, Collier Salina, MD      . heparin injection 5,000 Units  5,000 Units Subcutaneous Q8H [name] Sine, MD   5,000 Units at 04/28/18 0547  . [START ON 04/29/2018] insulin regular, human (MYXREDLIN) 100 units/ 100 mL infusion   Intravenous To OR Prescott Gum, Collier Salina, MD      . Derrill Memo ON 04/29/2018] magnesium sulfate (IV Push/IM) injection 40 mEq  40 mEq Other To OR Prescott Gum, Collier Salina, MD      . metoprolol succinate (TOPROL-XL) 24 hr tablet 25 mg  25 mg Oral Daily Sande Rives E, PA-C   25 mg at 04/28/18 1036  . [START ON 04/29/2018] milrinone (PRIMACOR) 20 MG/100 ML (0.2 mg/mL) infusion  0.3 mcg/kg/min Intravenous To OR Prescott Gum, Collier Salina, MD      . Derrill Memo ON 04/29/2018] nitroGLYCERIN 50 mg in dextrose 5 % 250 mL (0.2 mg/mL) infusion  2-200 mcg/min Intravenous To OR Prescott Gum, Collier Salina,  MD      . ondansetron [facility]) injection 4 mg  4 mg Intravenous Q6H PRN [name] Sine, MD      . Derrill Memo ON 04/29/2018] phenylephrine (NEOSYNEPHRINE) 20-0.9 MG/250ML-% infusion  30-200 mcg/min Intravenous To OR Prescott Gum, Collier Salina, MD      . Derrill Memo ON 04/29/2018] potassium chloride injection 80 mEq  80 mEq Other To OR Prescott Gum, Collier Salina, MD      . sodium chloride flush (NS)  0.9 % injection 3 mL  3 mL Intravenous Q12H [name] A, MD      . sodium chloride flush (NS) 0.9 % injection 3 mL  3 mL Intravenous PRN [name] Sine, MD      . Derrill Memo ON 04/29/2018] tranexamic acid (CYKLOKAPRON) 2,500 mg in sodium chloride 0.9 % 250 mL (10 mg/mL) infusion  1.5 mg/kg/hr Intravenous To OR Prescott Gum, Collier Salina, MD      . Derrill Memo ON 04/29/2018] tranexamic acid (CYKLOKAPRON) bolus via infusion - over 30 minutes 1,528.5 mg  15 mg/kg Intravenous To OR Prescott Gum, Collier Salina, MD      . Derrill Memo ON 04/29/2018] tranexamic acid (CYKLOKAPRON) pump prime solution 204 mg  2 mg/kg Intracatheter To OR Prescott Gum, Collier Salina, MD      . Derrill Memo ON 04/29/2018] vancomycin (VANCOCIN) 1,250 mg in sodium chloride 0.9 % 250 mL IVPB  1,250 mg Intravenous To OR Prescott Gum, Collier Salina, MD        LABS: Lab Results  Component Value Date   WBC 6.1 04/27/2018   HGB 12.4 (L) 04/27/2018   HCT 35.2 (L) 04/27/2018   MCV 85.9 04/27/2018   PLT 193 04/27/2018      Component Value Date/Time   NA 139 04/27/2018 0528   K 3.9 04/27/2018 0528   CL 110 04/27/2018 0528   CO2 23 04/27/2018 0528   GLUCOSE 92 04/27/2018 0528   BUN 20 04/27/2018 0528   CREATININE 1.15 04/27/2018 0528   CALCIUM 8.4 (L) 04/27/2018 0528   GFRNONAA >60 04/27/2018 0528   GFRAA >60 04/27/2018 0528   Lab Results  Component Value Date   INR 1.07 04/28/2018   INR 1.13 05/31/2015   No results found for: PTT  SOCIAL HISTORY: Social History   Socioeconomic History  . Marital status: Married    Spouse name: Not on file  . Number of children: Not on file  . Years of education: Not on file   . Highest education level: Not on file  Occupational History    Employer: [employer]    Comment: customer service [employer]  Social Needs  . Financial resource strain: Not on file  . Food insecurity:    Worry: Not on file    Inability: Not on file  . Transportation needs:    Medical: Not on file    Non-medical: Not on file  Tobacco Use  . Smoking status: Never Smoker  . Smokeless tobacco: Never Used  Substance and Sexual Activity  . Alcohol use: Yes    Comment: Occasional  . Drug use: No  . Sexual activity: Not on file  Lifestyle  . Physical activity:    Days per week: Not on file    Minutes per session: Not on file  . Stress: Not on file  Relationships  . Social connections:    Talks on phone: Not on file    Gets together: Not on file    Attends religious service: Not on file    Active member of club or organization: Not on file    Attends meetings of clubs or organizations: Not on file    Relationship status: Not on file  . Intimate partner violence:    Fear of current or ex partner: Not on file    Emotionally abused: Not on file    Physically abused: Not on file    Forced sexual activity: Not on file  Other Topics Concern  . Not on file  Social History Narrative  .  Not on file    FAMILY HISTORY: Family History  Problem Relation Age of Onset  . Prostate cancer Father 45       in his 70's/other comorbidities/79 deceased  . Breast cancer Mother 68       mets to bone,deceased age 20  . Cervical cancer Sister        2 sisters  . Cervical cancer Sister   . CAD Brother        Died of sudden cardiac death/[place]    REVIEW OF SYSTEMS: Reviewed with the patient as per History of present illness. Psych: Patient denies having dental phobia.  DENTAL HISTORY: CHIEF COMPLAINT: The patient was referred by Dr. Tharon Aquas Trigt for dental consultation.  HPI: Lawrence Hall is a 73 year old male recently diagnosed with coronary disease and aortic valve disease.   Patient with anticipated coronary artery bypass graft procedure along with aortic valve replacement or repair.  The patient is now seen as part of a pre-heart valve surgery dental protocol examination to rule out dental infection that may affect the patient stomach health and anticipated heart valve surgery.  The patient recently had an upper partial and a lower complete denture inserted approximately 3 months ago.  This was with Dr. Ronnald Ramp and Bing Plume.  Patient denies having any problems with the dentures at this time.  Patient in case that the dentures  "fit fine." The patient also has a history of previous periodontal therapy denies having any significant tooth mobility.  Patient denies having any dental phobia.   DENTAL EXAMINATION: GENERAL: The patient is a well-developed, well-nourished male in no acute distress. HEAD AND NECK: There is no palpable neck lymphadenopathy.  The patient denies acute TMJ symptoms INTRAORAL EXAM: Patient has normal saliva.  There is no evidence of oral abscess formation.  There is no evidence of denture irritation.  There are no remaining teeth involving the lower arch.  There is atrophy of the edentulous alveolar ridges. DENTITION: Patient is missing all teeth with the exception of tooth numbers 7 through 12. PERIODONTAL: The patient has plaque accumulations, gingival recession, and no significant tooth mobility.  Appears to be incipient to moderate bone loss noted. DENTAL CARIES/SUBOPTIMAL RESTORATIONS: No dental caries are noted. ENDODONTIC: The patient currently denies acute pulpitis symptoms.  I do not see any evidence of periapical pathology. CROWN AND BRIDGE: There are no crown or bridge restorations. PROSTHODONTIC: Patient has a upper partial denture in the lower complete denture.  The patient does not have the dentures with him at this time.  These dentures were recently inserted 2 to 3 months ago. OCCLUSION: I am unable to assess the occlusion of the patient  due to lack of having his maxillary partial denture and lower complete denture with him in the hospital.  RADIOGRAPHIC INTERPRETATION: Orthopantogram was taken on 04/27/2008. Patient is missing all remaining teeth with the exception of tooth numbers 7 through 12.  There is incipient a moderate bone loss noted.  There is no evidence of periapical pathology or radiolucency.  Multiple resin restorations are noted.  There is atrophy of the edentulous alveolar ridges.   ASSESSMENTS: 1.  Moderate aortic stenosis and coronary artery disease. 2.  Pre-coronary artery bypass graft/aortic valve replacement dental protocol examination 3.  Chronic periodontitis with bone loss 4.  Gingival recession 5.  Accretions-minimal 6.  Multiple missing teeth 7.  Atrophy of the edentulous alveolar ridges  8.  Maxillary partial denture and mandibular complete denture 9.  Need for antibiotic  premedication prior to invasive dental procedures after the anticipated heart valve surgery.  PLAN/RECOMMENDATIONS: 1. I discussed the risks, benefits, and complications of various treatment options with the patient in relationship to his medical and dental conditions, anticipated heart valve surgery, and risk for endocarditis.  Patient does not have any acute dental problems at this time.  Patient has had regular dental care over the last year and a half including periodontal therapy, dental extractions, and insertion of upper partial denture and lower complete denture.  The patient denies any acute problems with the dentures.  The patient therefore is currently cleared for heart valve surgery with Dr. Tharon Aquas Trigt as indicated.  The patient is to maintain active periodontal recall with his primary dentist and will need antibiotic premedication prior to invasive dental procedures after the heart valve surgery.  Patient also is to be monitored for denture irritation as this may be a source of infection and potential endocarditis if the  dentures are not adjusted appropriately.  Patient and wife expressed understanding with all discussion.  The patient will currently be prescribed chlorhexidine rinses for use on a twice a day basis after breakfast and at bedtime over the next 3 to 6 months.   2. Discussion of findings with medical team and coordination of future medical and dental care as needed.    Lenn Cal, DDS

## 2018-04-28 NOTE — Progress Notes (Addendum)
Progress Note  Patient Name: Lawrence Hall Date of Encounter: 04/28/2018  Primary Cardiologist: Sanda Klein, MD   Subjective   No significant overnight events. Although patient did have LOC and started shaking yesterday afternoon during PFT testing. Episodes lasted about 20-30 seconds before patient regained consciousness. Patient reported he felt fine after this episodes with no chest pain. Possible vasovagal event.   Patient is doing well this morning. No chest pain or shortness of breath. Mild headache throughout the night.  Inpatient Medications    Scheduled Meds: . aspirin  81 mg Oral Daily  . atorvastatin  80 mg Oral q1800  . heparin  5,000 Units Subcutaneous Q8H  . metoprolol succinate  12.5 mg Oral Daily  . sodium chloride flush  3 mL Intravenous Q12H   Continuous Infusions: . sodium chloride 100 mL/hr at 04/27/18 2203  . sodium chloride     PRN Meds: sodium chloride, acetaminophen, diazepam, ondansetron (ZOFRAN) IV, sodium chloride flush   Vital Signs    Vitals:   04/27/18 0808 04/27/18 1044 04/27/18 2215 04/28/18 0603  BP: 115/80 (!) 143/74 (!) 174/78 (!) 151/76  Pulse: 81 80 89 68  Resp: (!) 24  14 13   Temp:   98.3 F (36.8 C) 97.7 F (36.5 C)  TempSrc:   Oral Oral  SpO2:   96% 98%  Weight:    101.9 kg  Height:        Intake/Output Summary (Last 24 hours) at 04/28/2018 0809 Last data filed at 04/27/2018 1110 Gross per 24 hour  Intake 500.09 ml  Output -  Net 500.09 ml   Filed Weights   04/26/18 1217 04/27/18 0648 04/28/18 0603  Weight: 99.8 kg 102.1 kg 101.9 kg    Telemetry    Sinus rhythm with heart rates in the 70's to low 100's with multiple PACs. - Personally Reviewed  ECG    No new ECG tracing today. - Personally Reviewed  Physical Exam   GEN: Alert and in no acute distress.   Neck: Supple. Cardiac: RRR with some premature beats. II-III/VI systolic murmurs. No rubs or gallops.  Respiratory: Clear to auscultation bilaterally. GI:  Abdomen soft, non-tender, non-distended. Bowel sounds present.  MS: No lower extremity edema. No deformity. Neuro:  No focal deficits.  Psych: Normal affect.  Labs    Chemistry Recent Labs  Lab 04/26/18 1212 04/26/18 1232 04/27/18 0528 04/28/18 0348  NA 136 137 139  --   K 4.2 4.2 3.9  --   CL 105 105 110  --   CO2 21*  --  23  --   GLUCOSE 117* 115* 92  --   BUN 25* 26* 20  --   CREATININE 1.20 1.20 1.15  --   CALCIUM 8.9  --  8.4*  --   PROT 7.0  --   --  6.8  ALBUMIN 3.9  --   --  3.6  AST 21  --   --  17  ALT 17  --   --  16  ALKPHOS 33*  --   --  33*  BILITOT 1.0  --   --  1.2  GFRNONAA 59*  --  >60  --   GFRAA >60  --  >60  --   ANIONGAP 10  --  6  --      Hematology Recent Labs  Lab 04/26/18 1212 04/26/18 1232 04/27/18 0528  WBC 5.2  --  6.1  RBC 4.40  --  4.10*  HGB 13.0 11.9* 12.4*  HCT 38.0* 35.0* 35.2*  MCV 86.4  --  85.9  MCH 29.5  --  30.2  MCHC 34.2  --  35.2  RDW 13.0  --  13.1  PLT 202  --  193    Cardiac Enzymes Recent Labs  Lab 04/27/18 1054  TROPONINI <0.03    Recent Labs  Lab 04/26/18 1231  TROPIPOC 0.00     BNPNo results for input(s): BNP, PROBNP in the last 168 hours.   DDimer No results for input(s): DDIMER in the last 168 hours.   Radiology    Dg Orthopantogram  Result Date: 04/27/2018 CLINICAL DATA:  Preop films for bypass surgery. EXAM: ORTHOPANTOGRAM/PANORAMIC COMPARISON:  None. FINDINGS: Nearly edentulous apart from the upper central and lateral incisors as well as canines. Dental caries are identified about both central, right lateral incisor and right canine. Mild periodontal disease about the lateral incisors with lucencies near their roots. Mandible appears intact. IMPRESSION: Nearly edentulous patient with dental caries and periodontal disease as above. Electronically Signed   By: Ashley Royalty M.D.   On: 04/27/2018 19:42   Ct Chest Without Contrast  Result Date: 04/27/2018 CLINICAL DATA:  73 year old male with  history of thoracic aortic aneurysm. Follow-up study. EXAM: CT CHEST WITHOUT CONTRAST TECHNIQUE: Multidetector CT imaging of the chest was performed following the standard protocol without IV contrast. COMPARISON:  No priors. FINDINGS: Cardiovascular: Heart size is normal. There is no significant pericardial fluid, thickening or pericardial calcification. There is aortic atherosclerosis, as well as atherosclerosis of the great vessels of the mediastinum and the coronary arteries, including calcified atherosclerotic plaque in the left main, left anterior descending, left circumflex and right coronary arteries. Ectasia of ascending thoracic aorta (4.0 cm in diameter). Severe calcifications of the aortic valve. Mediastinum/Nodes: No pathologically enlarged mediastinal or hilar lymph nodes. Please note that accurate exclusion of hilar adenopathy is limited on noncontrast CT scans. Esophagus is unremarkable in appearance. No axillary lymphadenopathy. Lungs/Pleura: No acute consolidative airspace disease. No pleural effusions. No suspicious appearing pulmonary nodules or masses are noted. Upper Abdomen: Aortic atherosclerosis. Diffuse low attenuation throughout the visualized hepatic parenchyma, indicative of hepatic steatosis. Innumerable tiny calcified gallstones lying dependently in the gallbladder. Musculoskeletal: There are no aggressive appearing lytic or blastic lesions noted in the visualized portions of the skeleton. IMPRESSION: 1. Mild ectasia of the ascending thoracic aorta (4.0 cm in diameter). 2. Aortic atherosclerosis, in addition to left main and 3 vessel coronary artery disease. 3. Hepatic steatosis. 4. Cholelithiasis without evidence of acute cholecystitis. Aortic Atherosclerosis (ICD10-I70.0). Electronically Signed   By: Vinnie Langton M.D.   On: 04/27/2018 15:34   Dg Chest Port 1 View  Result Date: 04/26/2018 CLINICAL DATA:  Chest pain and ST depression during stress test EXAM: PORTABLE CHEST 1  VIEW COMPARISON:  07/26/2016 FINDINGS: Cardiac shadow is stable. Mild aortic calcifications are again seen and stable. The lungs are well aerated without focal infiltrate or sizable effusion. No bony abnormality is seen. IMPRESSION: No acute abnormality noted. Electronically Signed   By: Inez Catalina M.D.   On: 04/26/2018 12:36   Vas US Doppler Pre Cabg  Result Date: 04/27/2018 PREOPERATIVE VASCULAR EVALUATION  Indications:      Pre CABG evaluation. Risk Factors:     Hypertension, hyperlipidemia. Comparison Study: No comparison study available Performing Technologist: Rudell Cobb RDMS RVT  Examination Guidelines: A complete evaluation includes B-mode imaging, spectral Doppler, color Doppler, and power Doppler as needed of all accessible portions of  each vessel. Bilateral testing is considered an integral part of a complete examination. Limited examinations for reoccurring indications may be performed as noted.  Right Carotid Findings: +----------+-------+--------+--------+--------------------------------+--------+           PSV    EDV cm/sStenosisDescribe                        Comments           cm/s                                                            +----------+-------+--------+--------+--------------------------------+--------+ CCA Prox  95     18                                                       +----------+-------+--------+--------+--------------------------------+--------+ CCA Distal85     15                                                       +----------+-------+--------+--------+--------------------------------+--------+ ICA Prox  134    37      1-39%   diffuse, heterogenous and                                                 calcific                                 +----------+-------+--------+--------+--------------------------------+--------+ ICA Mid   84     23                                                        +----------+-------+--------+--------+--------------------------------+--------+ ICA Distal81     25                                                       +----------+-------+--------+--------+--------------------------------+--------+ ECA       255    16                                                       +----------+-------+--------+--------+--------------------------------+--------+ Portions of this table do not appear on this page. +----------+--------+-------+--------+------------+           PSV cm/sEDV cmsDescribeArm Pressure +----------+--------+-------+--------+------------+ [id number]  126          +----------+--------+-------+--------+------------+ +---------+--------+--+--------+--+---------+ VertebralPSV cm/s36EDV cm/s21Antegrade +---------+--------+--+--------+--+---------+ Left Carotid Findings: +----------+--------+--------+--------+-------------------------------+--------+           PSV cm/sEDV cm/sStenosisDescribe                       Comments +----------+--------+--------+--------+-------------------------------+--------+ CCA Prox  101     25                                             tortuous +----------+--------+--------+--------+-------------------------------+--------+ CCA Distal102     24              focal and calcific                      +----------+--------+--------+--------+-------------------------------+--------+ ICA Prox  115     33      1-39%   calcific, irregular and diffuse         +----------+--------+--------+--------+-------------------------------+--------+ ICA Mid   86      25                                                      +----------+--------+--------+--------+-------------------------------+--------+ ICA Distal98      28                                                      +----------+--------+--------+--------+-------------------------------+--------+ ECA       153      25                                                      +----------+--------+--------+--------+-------------------------------+--------+ +----------+--------+--------+--------+------------+ SubclavianPSV cm/sEDV cm/sDescribeArm Pressure +----------+--------+--------+--------+------------+           132                                  +----------+--------+--------+--------+------------+ +---------+--------+--+--------+--+---------+ VertebralPSV cm/s48EDV cm/s15Antegrade +---------+--------+--+--------+--+---------+  ABI Findings: +--------+------------------+-----+---------+--------+ Right   Rt Pressure (mmHg)IndexWaveform Comment  +--------+------------------+-----+---------+--------+ [id number]                    triphasic         +--------+------------------+-----+---------+--------+ PTA     142               1.13 triphasic         +--------+------------------+-----+---------+--------+ DP      120               0.95 biphasic          +--------+------------------+-----+---------+--------+ +--------+------------------+-----+---------+----------------------------------+ Left    Lt Pressure (mmHg)IndexWaveform Comment                            +--------+------------------+-----+---------+----------------------------------+ Brachial  triphasicunable to obtain due to IV on site +--------+------------------+-----+---------+----------------------------------+ PTA     143               1.13 triphasic                                   +--------+------------------+-----+---------+----------------------------------+ DP      119               0.94 biphasic                                    +--------+------------------+-----+---------+----------------------------------+ +-------+---------------+----------------+ ABI/TBIToday's ABI/TBIPrevious ABI/TBI +-------+---------------+----------------+ Right  1.13                             +-------+---------------+----------------+ Left   1.13                            +-------+---------------+----------------+  Right Doppler Findings: +--------+--------+-----+---------+--------+ Site    PressureIndexDoppler  Comments +--------+--------+-----+---------+--------+ [id number]          triphasic         +--------+--------+-----+---------+--------+ Radial               triphasic         +--------+--------+-----+---------+--------+ Ulnar                triphasic         +--------+--------+-----+---------+--------+  Left Doppler Findings: +--------+--------+-----+---------+----------------------------------+ Site    PressureIndexDoppler  Comments                           +--------+--------+-----+---------+----------------------------------+ Brachial             triphasicunable to obtain due to IV on site +--------+--------+-----+---------+----------------------------------+ Radial               triphasic                                   +--------+--------+-----+---------+----------------------------------+ Ulnar                triphasic                                   +--------+--------+-----+---------+----------------------------------+  Summary: Right Carotid: Velocities in the right ICA are consistent with a 1-39% stenosis. Left Carotid: Velocities in the left ICA are consistent with a 1-39% stenosis. Vertebrals: Bilateral vertebral arteries demonstrate antegrade flow. Right ABI: Resting right ankle-brachial index is within normal range. No evidence of significant right lower extremity arterial disease. Left ABI: Resting left ankle-brachial index is within normal range. No evidence of significant left lower extremity arterial disease. Right Upper Extremity: Doppler waveforms remain within normal limits with right radial compression. Doppler waveform obliterate with right ulnar compression. Left Upper Extremity: Doppler waveforms remain within  normal limits with left radial compression. Doppler waveforms decrease >50% with left ulnar compression.  Electronically signed by Harold Barban MD on 04/27/2018 at 6:01:08 PM.    Final     Cardiac Studies   Right/ Left Cardiac Catheterization 04/26/2018:  Prox LAD lesion is 50% stenosed.  Mid LM to Dist LM lesion  is 70% stenosed.  Previously placed Prox Cx stent (unknown type) is widely patent.  Prox RCA to Mid RCA lesion is 75% stenosed.   Multivessel CAD with smooth eccentric 70% distal left main stenosis; 50% proximal LAD stenosis; patent left circumflex stent; and 75% proximal RCA stenosis.  Normal right heart pressures.  Calcified aortic valve with markedly reduced excursion by aortography.  Moderate aortic stenosis with a peak instantaneous gradient of 36, peak to peak gradient of 31, and mean gradient of 26 mmHg with a valve area of 1.1 cm.  Recommendation: Surgical consultation for AVR/CABG revascularization. _______________  Echocardiogram 04/27/2018: Study Conclusions: - Left ventricle: The cavity size was normal. Wall thickness was   normal. Systolic function was vigorous. The estimated ejection   fraction was in the range of 65% to 70%. Although no diagnostic   regional wall motion abnormality was identified, this possibility   cannot be completely excluded on the basis of this study. Doppler   parameters are consistent with abnormal left ventricular   relaxation (grade 1 diastolic dysfunction). - Aortic valve: Trileaflet; moderately calcified leaflets. There   was moderate stenosis. Mean gradient (S): 26 mm Hg. Peak gradient   (S): 50 mm Hg. Valve area (VTI): 1.33 cm^2. - Mitral valve: Mildly calcified annulus. Mildly calcified leaflets   . There was no significant regurgitation. - Right ventricle: The cavity size was normal. Systolic function   was normal. - Pulmonary arteries: No complete TR doppler jet so unable to   estimate PA systolic pressure. -  Systemic veins: IVC measured 2.3 cm with > 50% respirophasic   variation, suggesting RA pressure 8 mmHg.  Impressions: - Normal LV size and systolic function, EF 17-51%. Normal RV size   and systolic function. Moderate aortic stenosis.  Patient Profile   Lawrence Hall is a 73 y.o. male with a history of CAD s/p NSTEMI with DES to left circumflex in 2017, mild to moderate aortic stenosis, hypertension, hyperlipidemia, who was sent to the [facility] ED from our office via EMS on 04/26/2018 after undergoing a treadmill stress test during which she developed severe hypotension with near syncope and had marked ST segment depression in multiple leads with ST segment elevation in lead aVR. Cardiac catheterization showed multivessel CAD with moderate aortic stenosis. CT surgery consultation was made for possible AVR/CABG.  Assessment & Plan    Multivessel CAD  - Cardiac catheterization showed 70% stenosis of distal left main, 50% stenosis of proximal LAD, 75% stenosis of proximal RCA, and patent left circumflex stent. - Repeat troponin negative yesterday. - Continue Aspirin, statin, and beta-blocker. - CT surgery was consulted yesterday. Plan is for CABG/AVR tomorrow.   Moderate Aortic Stenosis - Moderate aortic stenosis on catheterization with mean gradient of 26 mmHg and valve area of 1.1 cm^2. Normal right heart pressures.  - Echo showed LVEF of 65-70% with moderate aortic stenosis as well. - Plan is for CABG/AVR pending pre op workup.  Hypertension - Most recent BP 151/76. - Patient states his BP is normally in the 120s/70s at home.  - Will increase Toprol from 12.5 to 25mg  daily for further heart rate and BP control. - Will continue to monitor.   Hyperlipidemia - Continue high-intensity statin. Currently, on Lipitor 80mg  daily.  Of note, TSH elevated at 5.037. Will check free T4.   For questions or updates, please contact [place] Please consult [email or web address] for contact info under          Signed, Darreld Mclean, PA-C  04/28/2018, 8:09 AM    ---------------------------------------------------------------------------------------------   History and all data above reviewed.  Patient examined.  I agree with the findings as above.  Lawrence Hall feels well, no chest pain. We walked in the halls together, he has no shortness of breath, able to easily talk while ambulating.   Constitutional: No acute distress Eyes: pupils equally round and reactive to light, sclera non-icteric, normal conjunctiva and lids ENMT: normal dentition, moist mucous membranes Cardiovascular: regular rhythm, normal rate, 3/6 systolic ejection murmur with preserved S2. S1 normal. Radial pulses normal bilaterally. No jugular venous distention.  Respiratory: clear to auscultation bilaterally GI : normal bowel sounds, soft and nontender. No distention.   MSK: extremities warm, well perfused. No edema.  NEURO: grossly nonfocal exam, moves all extremities. PSYCH: alert and oriented x 3, normal mood and affect.   All available labs, radiology testing, previous records reviewed. Agree with documented assessment and plan of my colleague as stated above with the following additions or changes:  Active Problems:   Nonrheumatic aortic valve stenosis   Near syncope   CAD (coronary artery disease), native coronary artery    Plan: Continue pre-operative evaluation for CABG/AVR. Patient has periodontal disease with dental caries, will need evaluation from dental specialty prior to CABG.   Remainder per Virgie Dad PA-C.  Length of Stay:  LOS: 2 days   Elouise Munroe, MD [facility] 10:55 AM  04/28/2018

## 2018-04-29 ENCOUNTER — Inpatient Hospital Stay (HOSPITAL_COMMUNITY)
Admission: EM | Disposition: A | Discharge status: Home / Self Care | Payer: Self-pay | Source: Ambulatory Visit | Attending: Cardiothoracic Surgery

## 2018-04-29 ENCOUNTER — Inpatient Hospital Stay (HOSPITAL_COMMUNITY): Payer: PPO

## 2018-04-29 ENCOUNTER — Inpatient Hospital Stay (HOSPITAL_COMMUNITY): Payer: PPO | Admitting: Certified Registered Nurse Anesthetist

## 2018-04-29 DIAGNOSIS — Z951 Presence of aortocoronary bypass graft: Secondary | ICD-10-CM

## 2018-04-29 HISTORY — PX: AORTIC VALVE REPLACEMENT: SHX41

## 2018-04-29 HISTORY — PX: CORONARY ARTERY BYPASS GRAFT: SHX141

## 2018-04-29 HISTORY — PX: TEE WITHOUT CARDIOVERSION: SHX5443

## 2018-04-29 LAB — POCT I-STAT 3, ART BLOOD GAS (G3+)
ACID-BASE DEFICIT: 3 mmol/L — AB (ref 0.0–2.0)
ACID-BASE DEFICIT: 2 mmol/L (ref 0.0–2.0)
Acid-base deficit: 3 mmol/L — ABNORMAL HIGH (ref 0.0–2.0)
Bicarbonate: 25.5 mmol/L (ref 20.0–28.0)
Bicarbonate: 22.1 mmol/L (ref 20.0–28.0)
Bicarbonate: 21.5 mmol/L (ref 20.0–28.0)
Bicarbonate: 22.6 mmol/L (ref 20.0–28.0)
O2 Saturation: 100 %
O2 Saturation: 99 %
O2 Saturation: 96 %
O2 Saturation: 98 %
PCO2 ART: 32.7 mmHg (ref 32.0–48.0)
PO2 ART: 165 mmHg — AB (ref 83.0–108.0)
PO2 ART: 75 mmHg — AB (ref 83.0–108.0)
Patient temperature: 35.7
Patient temperature: 37.5
TCO2: 27 mmol/L (ref 22–32)
TCO2: 23 mmol/L (ref 22–32)
TCO2: 23 mmol/L (ref 22–32)
TCO2: 24 mmol/L (ref 22–32)
pCO2 arterial: 42.6 mmHg (ref 32.0–48.0)
pCO2 arterial: 37.3 mmHg (ref 32.0–48.0)
pCO2 arterial: 39.7 mmHg (ref 32.0–48.0)
pH, Arterial: 7.385 (ref 7.350–7.450)
pH, Arterial: 7.379 (ref 7.350–7.450)
pH, Arterial: 7.42 (ref 7.350–7.450)
pH, Arterial: 7.366 (ref 7.350–7.450)
pO2, Arterial: 268 mmHg — ABNORMAL HIGH (ref 83.0–108.0)
pO2, Arterial: 118 mmHg — ABNORMAL HIGH (ref 83.0–108.0)

## 2018-04-29 LAB — POCT I-STAT, CHEM 8
BUN: 17 mg/dL (ref 8–23)
BUN: 15 mg/dL (ref 8–23)
BUN: 16 mg/dL (ref 8–23)
BUN: 15 mg/dL (ref 8–23)
BUN: 16 mg/dL (ref 8–23)
BUN: 17 mg/dL (ref 8–23)
BUN: 14 mg/dL (ref 8–23)
BUN: 17 mg/dL (ref 8–23)
CHLORIDE: 104 mmol/L (ref 98–111)
CHLORIDE: 106 mmol/L (ref 98–111)
Calcium, Ion: 1.21 mmol/L (ref 1.15–1.40)
Calcium, Ion: 1.05 mmol/L — ABNORMAL LOW (ref 1.15–1.40)
Calcium, Ion: 1.2 mmol/L (ref 1.15–1.40)
Calcium, Ion: 1.09 mmol/L — ABNORMAL LOW (ref 1.15–1.40)
Calcium, Ion: 1.09 mmol/L — ABNORMAL LOW (ref 1.15–1.40)
Calcium, Ion: 1.13 mmol/L — ABNORMAL LOW (ref 1.15–1.40)
Calcium, Ion: 1.05 mmol/L — ABNORMAL LOW (ref 1.15–1.40)
Calcium, Ion: 1.13 mmol/L — ABNORMAL LOW (ref 1.15–1.40)
Chloride: 106 mmol/L (ref 98–111)
Chloride: 107 mmol/L (ref 98–111)
Chloride: 104 mmol/L (ref 98–111)
Chloride: 107 mmol/L (ref 98–111)
Chloride: 106 mmol/L (ref 98–111)
Chloride: 108 mmol/L (ref 98–111)
Creatinine, Ser: 0.8 mg/dL (ref 0.61–1.24)
Creatinine, Ser: 0.7 mg/dL (ref 0.61–1.24)
Creatinine, Ser: 0.7 mg/dL (ref 0.61–1.24)
Creatinine, Ser: 0.7 mg/dL (ref 0.61–1.24)
Creatinine, Ser: 0.7 mg/dL (ref 0.61–1.24)
Creatinine, Ser: 0.7 mg/dL (ref 0.61–1.24)
Creatinine, Ser: 0.7 mg/dL (ref 0.61–1.24)
Creatinine, Ser: 0.9 mg/dL (ref 0.61–1.24)
Glucose, Bld: 114 mg/dL — ABNORMAL HIGH (ref 70–99)
Glucose, Bld: 100 mg/dL — ABNORMAL HIGH (ref 70–99)
Glucose, Bld: 102 mg/dL — ABNORMAL HIGH (ref 70–99)
Glucose, Bld: 161 mg/dL — ABNORMAL HIGH (ref 70–99)
Glucose, Bld: 140 mg/dL — ABNORMAL HIGH (ref 70–99)
Glucose, Bld: 158 mg/dL — ABNORMAL HIGH (ref 70–99)
Glucose, Bld: 132 mg/dL — ABNORMAL HIGH (ref 70–99)
Glucose, Bld: 107 mg/dL — ABNORMAL HIGH (ref 70–99)
HCT: 31 % — ABNORMAL LOW (ref 39.0–52.0)
HCT: 27 % — ABNORMAL LOW (ref 39.0–52.0)
HCT: 26 % — ABNORMAL LOW (ref 39.0–52.0)
HCT: 24 % — ABNORMAL LOW (ref 39.0–52.0)
HCT: 26 % — ABNORMAL LOW (ref 39.0–52.0)
HCT: 29 % — ABNORMAL LOW (ref 39.0–52.0)
HEMATOCRIT: 26 % — AB (ref 39.0–52.0)
HEMATOCRIT: 25 % — AB (ref 39.0–52.0)
Hemoglobin: 10.5 g/dL — ABNORMAL LOW (ref 13.0–17.0)
Hemoglobin: 8.8 g/dL — ABNORMAL LOW (ref 13.0–17.0)
Hemoglobin: 9.2 g/dL — ABNORMAL LOW (ref 13.0–17.0)
Hemoglobin: 8.8 g/dL — ABNORMAL LOW (ref 13.0–17.0)
Hemoglobin: 8.2 g/dL — ABNORMAL LOW (ref 13.0–17.0)
Hemoglobin: 8.8 g/dL — ABNORMAL LOW (ref 13.0–17.0)
Hemoglobin: 8.5 g/dL — ABNORMAL LOW (ref 13.0–17.0)
Hemoglobin: 9.9 g/dL — ABNORMAL LOW (ref 13.0–17.0)
POTASSIUM: 4.6 mmol/L (ref 3.5–5.1)
Potassium: 4.2 mmol/L (ref 3.5–5.1)
Potassium: 4.2 mmol/L (ref 3.5–5.1)
Potassium: 4.9 mmol/L (ref 3.5–5.1)
Potassium: 4.2 mmol/L (ref 3.5–5.1)
Potassium: 4.9 mmol/L (ref 3.5–5.1)
Potassium: 4.1 mmol/L (ref 3.5–5.1)
Potassium: 4.5 mmol/L (ref 3.5–5.1)
SODIUM: 140 mmol/L (ref 135–145)
Sodium: 139 mmol/L (ref 135–145)
Sodium: 138 mmol/L (ref 135–145)
Sodium: 139 mmol/L (ref 135–145)
Sodium: 136 mmol/L (ref 135–145)
Sodium: 138 mmol/L (ref 135–145)
Sodium: 139 mmol/L (ref 135–145)
Sodium: 139 mmol/L (ref 135–145)
TCO2: 25 mmol/L (ref 22–32)
TCO2: 26 mmol/L (ref 22–32)
TCO2: 26 mmol/L (ref 22–32)
TCO2: 25 mmol/L (ref 22–32)
TCO2: 24 mmol/L (ref 22–32)
TCO2: 25 mmol/L (ref 22–32)
TCO2: 24 mmol/L (ref 22–32)
TCO2: 22 mmol/L (ref 22–32)

## 2018-04-29 LAB — POCT I-STAT 4, (NA,K, GLUC, HGB,HCT)
Glucose, Bld: 118 mg/dL — ABNORMAL HIGH (ref 70–99)
HCT: 26 % — ABNORMAL LOW (ref 39.0–52.0)
Hemoglobin: 8.8 g/dL — ABNORMAL LOW (ref 13.0–17.0)
Potassium: 4 mmol/L (ref 3.5–5.1)
Sodium: 141 mmol/L (ref 135–145)

## 2018-04-29 LAB — APTT
aPTT: 32 seconds (ref 24–36)
aPTT: 33 seconds (ref 24–36)

## 2018-04-29 LAB — ECHO TEE
AV Area VTI: 0.7 cm2
AV Mean grad: 24 mmHg
AV Peak grad: 45 mmHg

## 2018-04-29 LAB — HEMOGLOBIN AND HEMATOCRIT, BLOOD
HCT: 25.7 % — ABNORMAL LOW (ref 39.0–52.0)
Hemoglobin: 8.9 g/dL — ABNORMAL LOW (ref 13.0–17.0)

## 2018-04-29 LAB — GLUCOSE, CAPILLARY
Glucose-Capillary: 103 mg/dL — ABNORMAL HIGH (ref 70–99)
Glucose-Capillary: 109 mg/dL — ABNORMAL HIGH (ref 70–99)
Glucose-Capillary: 111 mg/dL — ABNORMAL HIGH (ref 70–99)
Glucose-Capillary: 112 mg/dL — ABNORMAL HIGH (ref 70–99)
Glucose-Capillary: 113 mg/dL — ABNORMAL HIGH (ref 70–99)
Glucose-Capillary: 115 mg/dL — ABNORMAL HIGH (ref 70–99)
Glucose-Capillary: 98 mg/dL (ref 70–99)

## 2018-04-29 LAB — BASIC METABOLIC PANEL
Anion gap: 9 (ref 5–15)
BUN: 17 mg/dL (ref 8–23)
CO2: 20 mmol/L — ABNORMAL LOW (ref 22–32)
Calcium: 8.7 mg/dL — ABNORMAL LOW (ref 8.9–10.3)
Chloride: 108 mmol/L (ref 98–111)
Creatinine, Ser: 1.05 mg/dL (ref 0.61–1.24)
GFR calc Af Amer: >60 mL/min (ref >60)
GFR calc non Af Amer: >60 mL/min (ref >60)
Glucose, Bld: 103 mg/dL — ABNORMAL HIGH (ref 70–99)
Potassium: 3.7 mmol/L (ref 3.5–5.1)
Sodium: 137 mmol/L (ref 135–145)

## 2018-04-29 LAB — CREATININE, SERUM
Creatinine, Ser: 1.07 mg/dL (ref 0.61–1.24)
GFR calc Af Amer: >60 mL/min (ref >60)
GFR calc non Af Amer: >60 mL/min (ref >60)

## 2018-04-29 LAB — CBC
HCT: 37.1 % — ABNORMAL LOW (ref 39.0–52.0)
HCT: 27.2 % — ABNORMAL LOW (ref 39.0–52.0)
HCT: 28.3 % — ABNORMAL LOW (ref 39.0–52.0)
Hemoglobin: 12.7 g/dL — ABNORMAL LOW (ref 13.0–17.0)
Hemoglobin: 9.3 g/dL — ABNORMAL LOW (ref 13.0–17.0)
Hemoglobin: 9.7 g/dL — ABNORMAL LOW (ref 13.0–17.0)
MCH: 28.7 pg (ref 26.0–34.0)
MCH: 28.8 pg (ref 26.0–34.0)
MCH: 28.9 pg (ref 26.0–34.0)
MCHC: 34.2 g/dL (ref 30.0–36.0)
MCHC: 34.2 g/dL (ref 30.0–36.0)
MCHC: 34.3 g/dL (ref 30.0–36.0)
MCV: 83.7 fL (ref 80.0–100.0)
MCV: 84.2 fL (ref 80.0–100.0)
MCV: 84.2 fL (ref 80.0–100.0)
Platelets: 180 10*3/uL (ref 150–400)
Platelets: 101 10*3/uL — ABNORMAL LOW (ref 150–400)
Platelets: 127 10*3/uL — ABNORMAL LOW (ref 150–400)
RBC: 4.43 MIL/uL (ref 4.22–5.81)
RBC: 3.23 MIL/uL — ABNORMAL LOW (ref 4.22–5.81)
RBC: 3.36 MIL/uL — ABNORMAL LOW (ref 4.22–5.81)
RDW: 12.9 % (ref 11.5–15.5)
RDW: 12.8 % (ref 11.5–15.5)
RDW: 13 % (ref 11.5–15.5)
WBC: 5.7 10*3/uL (ref 4.0–10.5)
WBC: 9.7 10*3/uL (ref 4.0–10.5)
WBC: 11 10*3/uL — ABNORMAL HIGH (ref 4.0–10.5)
nRBC: 0 % (ref 0.0–0.2)
nRBC: 0 % (ref 0.0–0.2)
nRBC: 0 % (ref 0.0–0.2)

## 2018-04-29 LAB — PROTIME-INR
INR: 1.38
Prothrombin Time: 16.8 seconds — ABNORMAL HIGH (ref 11.4–15.2)

## 2018-04-29 LAB — PLATELET COUNT: Platelets: 126 10*3/uL — ABNORMAL LOW (ref 150–400)

## 2018-04-29 LAB — MAGNESIUM: Magnesium: 3.3 mg/dL — ABNORMAL HIGH (ref 1.7–2.4)

## 2018-04-29 SURGERY — CORONARY ARTERY BYPASS GRAFTING (CABG)
Anesthesia: General | Site: Chest

## 2018-04-29 MED ORDER — SODIUM CHLORIDE 0.9% FLUSH
10.0000 mL | Freq: Two times a day (BID) | INTRAVENOUS | Status: DC
  Administered 2018-04-29 – 2018-04-30 (×3): 10 mL

## 2018-04-29 MED ORDER — SODIUM CHLORIDE 0.9% FLUSH: 10.0000 mL | INTRAVENOUS | Status: DC | PRN

## 2018-04-29 MED ORDER — TRAMADOL HCL 50 MG PO TABS
50.0000 mg | ORAL_TABLET | ORAL | Status: DC | PRN
  Administered 2018-04-30: 50 mg via ORAL
  Administered 2018-04-30 – 2018-05-01 (×2): 100 mg via ORAL
  Filled 2018-04-29 (×2): qty 2
  Filled 2018-04-29: qty 1

## 2018-04-29 MED ORDER — SODIUM CHLORIDE 0.9 % IV SOLN
INTRAVENOUS | Status: DC | PRN
  Administered 2018-04-29: 15 ug/min via INTRAVENOUS

## 2018-04-29 MED ORDER — DEXMEDETOMIDINE HCL IN NACL 400 MCG/100ML IV SOLN
INTRAVENOUS | Status: DC | PRN
  Administered 2018-04-29: .3 ug/kg/h via INTRAVENOUS

## 2018-04-29 MED ORDER — HEMOSTATIC AGENTS (NO CHARGE) OPTIME
TOPICAL | Status: DC | PRN
  Administered 2018-04-29 (×2): 1 via TOPICAL

## 2018-04-29 MED ORDER — LACTATED RINGERS IV SOLN
INTRAVENOUS | Status: DC | PRN
  Administered 2018-04-29: 09:00:00 via INTRAVENOUS

## 2018-04-29 MED ORDER — SODIUM CHLORIDE 0.9% FLUSH
3.0000 mL | Freq: Two times a day (BID) | INTRAVENOUS | Status: DC
  Administered 2018-04-30 – 2018-05-01 (×3): 3 mL via INTRAVENOUS

## 2018-04-29 MED ORDER — HEPARIN SODIUM (PORCINE) 1000 UNIT/ML IJ SOLN
INTRAMUSCULAR | Status: AC
  Filled 2018-04-29: qty 1

## 2018-04-29 MED ORDER — CHLORHEXIDINE GLUCONATE 0.12 % MT SOLN
OROMUCOSAL | Status: AC
  Administered 2018-04-29: 15 mL via OROMUCOSAL
  Filled 2018-04-29: qty 15

## 2018-04-29 MED ORDER — ROCURONIUM BROMIDE 50 MG/5ML IV SOSY
PREFILLED_SYRINGE | INTRAVENOUS | Status: AC
  Filled 2018-04-29: qty 10

## 2018-04-29 MED ORDER — ALBUMIN HUMAN 5 % IV SOLN
INTRAVENOUS | Status: DC | PRN
  Administered 2018-04-29: 16:00:00 via INTRAVENOUS

## 2018-04-29 MED ORDER — SUCCINYLCHOLINE CHLORIDE 200 MG/10ML IV SOSY
PREFILLED_SYRINGE | INTRAVENOUS | Status: AC
  Filled 2018-04-29: qty 10

## 2018-04-29 MED ORDER — ASPIRIN EC 325 MG PO TBEC
325.0000 mg | DELAYED_RELEASE_TABLET | Freq: Every day | ORAL | Status: DC
  Administered 2018-04-30 – 2018-05-07 (×8): 325 mg via ORAL
  Filled 2018-04-29 (×8): qty 1

## 2018-04-29 MED ORDER — DEXMEDETOMIDINE HCL IN NACL 200 MCG/50ML IV SOLN
0.0000 ug/kg/h | INTRAVENOUS | Status: DC
  Administered 2018-04-29: 0.5 ug/kg/h via INTRAVENOUS
  Filled 2018-04-29: qty 50

## 2018-04-29 MED ORDER — POTASSIUM CHLORIDE 10 MEQ/50ML IV SOLN
10.0000 meq | INTRAVENOUS | Status: AC
  Filled 2018-04-29: qty 50

## 2018-04-29 MED ORDER — ACETAMINOPHEN 650 MG RE SUPP
650.0000 mg | Freq: Once | RECTAL | Status: AC
  Administered 2018-04-29: 650 mg via RECTAL

## 2018-04-29 MED ORDER — LACTATED RINGERS IV SOLN
INTRAVENOUS | Status: DC | PRN
  Administered 2018-04-29: 08:00:00 via INTRAVENOUS

## 2018-04-29 MED ORDER — METOPROLOL TARTRATE 12.5 MG HALF TABLET
12.5000 mg | ORAL_TABLET | Freq: Two times a day (BID) | ORAL | Status: DC
  Administered 2018-04-30 – 2018-05-01 (×3): 12.5 mg via ORAL
  Filled 2018-04-29 (×3): qty 1

## 2018-04-29 MED ORDER — ACETAMINOPHEN 160 MG/5ML PO SOLN
1000.0000 mg | Freq: Four times a day (QID) | ORAL | Status: AC
  Filled 2018-04-29: qty 40.6

## 2018-04-29 MED ORDER — CHLORHEXIDINE GLUCONATE 0.12 % MT SOLN
15.0000 mL | OROMUCOSAL | Status: AC
  Administered 2018-04-29: 15 mL via OROMUCOSAL

## 2018-04-29 MED ORDER — PHENYLEPHRINE 40 MCG/ML (10ML) SYRINGE FOR IV PUSH (FOR BLOOD PRESSURE SUPPORT)
PREFILLED_SYRINGE | INTRAVENOUS | Status: AC
  Filled 2018-04-29: qty 10

## 2018-04-29 MED ORDER — MIDAZOLAM HCL 5 MG/5ML IJ SOLN
INTRAMUSCULAR | Status: DC | PRN
  Administered 2018-04-29 (×3): 2 mg via INTRAVENOUS
  Administered 2018-04-29: 1 mg via INTRAVENOUS
  Administered 2018-04-29: 3 mg via INTRAVENOUS

## 2018-04-29 MED ORDER — NITROGLYCERIN IN D5W 200-5 MCG/ML-% IV SOLN: 0.0000 ug/min | INTRAVENOUS | Status: DC

## 2018-04-29 MED ORDER — SODIUM CHLORIDE (PF) 0.9 % IJ SOLN
OROMUCOSAL | Status: DC | PRN
  Administered 2018-04-29 (×4): 4 mL via TOPICAL

## 2018-04-29 MED ORDER — EPHEDRINE SULFATE 50 MG/ML IJ SOLN
INTRAMUSCULAR | Status: DC | PRN
  Administered 2018-04-29: 5 mg via INTRAVENOUS
  Administered 2018-04-29: 15 mg via INTRAVENOUS

## 2018-04-29 MED ORDER — METOCLOPRAMIDE HCL 5 MG/ML IJ SOLN
10.0000 mg | Freq: Four times a day (QID) | INTRAMUSCULAR | Status: AC
  Administered 2018-04-29 – 2018-05-04 (×18): 10 mg via INTRAVENOUS
  Filled 2018-04-29 (×17): qty 2

## 2018-04-29 MED ORDER — SODIUM CHLORIDE 0.9 % IV SOLN
20.0000 ug | INTRAVENOUS | Status: AC
  Administered 2018-04-29: 20 ug via INTRAVENOUS
  Filled 2018-04-29: qty 5

## 2018-04-29 MED ORDER — MIDAZOLAM HCL (PF) 10 MG/2ML IJ SOLN
INTRAMUSCULAR | Status: AC
  Filled 2018-04-29: qty 2

## 2018-04-29 MED ORDER — EPHEDRINE 5 MG/ML INJ
INTRAVENOUS | Status: AC
  Filled 2018-04-29: qty 20

## 2018-04-29 MED ORDER — LACTATED RINGERS IV SOLN
INTRAVENOUS | Status: DC | PRN
Start: 2018-04-29 — End: 2018-04-29
  Administered 2018-04-29: 07:00:00 via INTRAVENOUS

## 2018-04-29 MED ORDER — LACTATED RINGERS IV SOLN: INTRAVENOUS | Status: DC

## 2018-04-29 MED ORDER — 0.9 % SODIUM CHLORIDE (POUR BTL) OPTIME
TOPICAL | Status: DC | PRN
  Administered 2018-04-29: 5000 mL

## 2018-04-29 MED ORDER — MAGNESIUM SULFATE 4 GM/100ML IV SOLN
4.0000 g | Freq: Once | INTRAVENOUS | Status: AC
  Administered 2018-04-29: 4 g via INTRAVENOUS
  Filled 2018-04-29: qty 100

## 2018-04-29 MED ORDER — ONDANSETRON HCL 4 MG/2ML IJ SOLN
4.0000 mg | Freq: Four times a day (QID) | INTRAMUSCULAR | Status: DC | PRN
  Administered 2018-04-30 – 2018-05-04 (×4): 4 mg via INTRAVENOUS
  Filled 2018-04-29 (×4): qty 2

## 2018-04-29 MED ORDER — METOPROLOL TARTRATE 5 MG/5ML IV SOLN: 2.5000 mg | INTRAVENOUS | Status: DC | PRN

## 2018-04-29 MED ORDER — SODIUM CHLORIDE 0.9 % IV SOLN
1.5000 g | Freq: Two times a day (BID) | INTRAVENOUS | Status: AC
  Administered 2018-04-29 – 2018-05-01 (×4): 1.5 g via INTRAVENOUS
  Filled 2018-04-29 (×4): qty 1.5

## 2018-04-29 MED ORDER — MIDAZOLAM HCL 2 MG/2ML IJ SOLN: 2.0000 mg | INTRAMUSCULAR | Status: DC | PRN

## 2018-04-29 MED ORDER — VANCOMYCIN HCL IN DEXTROSE 1-5 GM/200ML-% IV SOLN
1000.0000 mg | Freq: Once | INTRAVENOUS | Status: AC
  Administered 2018-04-29: 1000 mg via INTRAVENOUS
  Filled 2018-04-29: qty 200

## 2018-04-29 MED ORDER — SODIUM CHLORIDE 0.9 % IV SOLN
INTRAVENOUS | Status: DC | PRN
  Administered 2018-04-29: 20 ug/min via INTRAVENOUS

## 2018-04-29 MED ORDER — LACTATED RINGERS IV SOLN: 500.0000 mL | Freq: Once | INTRAVENOUS | Status: DC | PRN

## 2018-04-29 MED ORDER — ASPIRIN 81 MG PO CHEW: 324.0000 mg | CHEWABLE_TABLET | Freq: Every day | ORAL | Status: DC

## 2018-04-29 MED ORDER — METOPROLOL TARTRATE 25 MG/10 ML ORAL SUSPENSION: 12.5000 mg | Freq: Two times a day (BID) | ORAL | Status: DC

## 2018-04-29 MED ORDER — ACETAMINOPHEN 500 MG PO TABS
1000.0000 mg | ORAL_TABLET | Freq: Four times a day (QID) | ORAL | Status: AC
  Administered 2018-04-30 – 2018-05-04 (×19): 1000 mg via ORAL
  Filled 2018-04-29 (×19): qty 2

## 2018-04-29 MED ORDER — CHLORHEXIDINE GLUCONATE 0.12% ORAL RINSE (MEDLINE KIT)
15.0000 mL | Freq: Two times a day (BID) | OROMUCOSAL | Status: DC
  Administered 2018-04-29 – 2018-04-30 (×2): 15 mL via OROMUCOSAL

## 2018-04-29 MED ORDER — ONDANSETRON HCL 4 MG/2ML IJ SOLN
INTRAMUSCULAR | Status: AC
  Filled 2018-04-29: qty 2

## 2018-04-29 MED ORDER — PROTAMINE SULFATE 10 MG/ML IV SOLN
INTRAVENOUS | Status: DC | PRN
  Administered 2018-04-29: 250 mg via INTRAVENOUS
  Administered 2018-04-29: 25 mg via INTRAVENOUS
  Administered 2018-04-29: 30 mg via INTRAVENOUS

## 2018-04-29 MED ORDER — OXYCODONE HCL 5 MG PO TABS
5.0000 mg | ORAL_TABLET | ORAL | Status: DC | PRN
  Administered 2018-04-30: 10 mg via ORAL
  Administered 2018-04-30: 5 mg via ORAL
  Administered 2018-05-01: 10 mg via ORAL
  Administered 2018-05-04: 5 mg via ORAL
  Filled 2018-04-29: qty 2
  Filled 2018-04-29 (×2): qty 1
  Filled 2018-04-29: qty 2

## 2018-04-29 MED ORDER — SODIUM CHLORIDE 0.9% FLUSH: 3.0000 mL | INTRAVENOUS | Status: DC | PRN

## 2018-04-29 MED ORDER — PLASMA-LYTE 148 IV SOLN
INTRAVENOUS | Status: DC | PRN
  Administered 2018-04-29: 500 mL via INTRAVASCULAR

## 2018-04-29 MED ORDER — PANTOPRAZOLE SODIUM 40 MG PO TBEC
40.0000 mg | DELAYED_RELEASE_TABLET | Freq: Every day | ORAL | Status: DC
  Administered 2018-05-01 – 2018-05-07 (×7): 40 mg via ORAL
  Filled 2018-04-29 (×7): qty 1

## 2018-04-29 MED ORDER — MORPHINE SULFATE (PF) 2 MG/ML IV SOLN
1.0000 mg | INTRAVENOUS | Status: DC | PRN
  Administered 2018-04-30 (×3): 2 mg via INTRAVENOUS
  Filled 2018-04-29 (×3): qty 1

## 2018-04-29 MED ORDER — MILRINONE LACTATE IN DEXTROSE 20-5 MG/100ML-% IV SOLN
0.3000 ug/kg/min | INTRAVENOUS | Status: DC
  Administered 2018-04-30: 0.25 ug/kg/min via INTRAVENOUS
  Filled 2018-04-29: qty 100

## 2018-04-29 MED ORDER — SODIUM CHLORIDE 0.9 % IV SOLN
INTRAVENOUS | Status: DC
  Administered 2018-04-29: 18:00:00 via INTRAVENOUS

## 2018-04-29 MED ORDER — SODIUM CHLORIDE 0.9 % IV SOLN
INTRAVENOUS | Status: DC | PRN
  Administered 2018-04-29: 15:00:00 via INTRAVENOUS

## 2018-04-29 MED ORDER — LIDOCAINE 2% (20 MG/ML) 5 ML SYRINGE
INTRAMUSCULAR | Status: AC
  Filled 2018-04-29: qty 5

## 2018-04-29 MED ORDER — CHLORHEXIDINE GLUCONATE CLOTH 2 % EX PADS
6.0000 | MEDICATED_PAD | Freq: Every day | CUTANEOUS | Status: DC
  Administered 2018-04-29 – 2018-05-01 (×3): 6 via TOPICAL

## 2018-04-29 MED ORDER — FAMOTIDINE IN NACL 20-0.9 MG/50ML-% IV SOLN
20.0000 mg | Freq: Two times a day (BID) | INTRAVENOUS | Status: AC
  Administered 2018-04-29 (×2): 20 mg via INTRAVENOUS
  Filled 2018-04-29: qty 50

## 2018-04-29 MED ORDER — PHENYLEPHRINE HCL-NACL 20-0.9 MG/250ML-% IV SOLN
0.0000 ug/min | INTRAVENOUS | Status: DC
  Administered 2018-04-30: 40 ug/min via INTRAVENOUS
  Filled 2018-04-29: qty 250

## 2018-04-29 MED ORDER — ATORVASTATIN CALCIUM 80 MG PO TABS
80.0000 mg | ORAL_TABLET | Freq: Every day | ORAL | Status: DC
  Administered 2018-04-30 – 2018-05-06 (×7): 80 mg via ORAL
  Filled 2018-04-29 (×7): qty 1

## 2018-04-29 MED ORDER — HEPARIN SODIUM (PORCINE) 1000 UNIT/ML IJ SOLN
INTRAMUSCULAR | Status: DC | PRN
  Administered 2018-04-29: 2000 [IU] via INTRAVENOUS
  Administered 2018-04-29: 29000 [IU] via INTRAVENOUS

## 2018-04-29 MED ORDER — SODIUM CHLORIDE 0.9 % IV SOLN: 250.0000 mL | INTRAVENOUS | Status: DC

## 2018-04-29 MED ORDER — ROCURONIUM BROMIDE 100 MG/10ML IV SOLN
INTRAVENOUS | Status: DC | PRN
  Administered 2018-04-29: 50 mg via INTRAVENOUS
  Administered 2018-04-29: 100 mg via INTRAVENOUS
  Administered 2018-04-29 (×2): 50 mg via INTRAVENOUS
  Administered 2018-04-29: 20 mg via INTRAVENOUS

## 2018-04-29 MED ORDER — SODIUM CHLORIDE 0.45 % IV SOLN: INTRAVENOUS | Status: DC | PRN

## 2018-04-29 MED ORDER — ORAL CARE MOUTH RINSE
15.0000 mL | OROMUCOSAL | Status: DC
  Administered 2018-04-29: 15 mL via OROMUCOSAL

## 2018-04-29 MED ORDER — PROTAMINE SULFATE 10 MG/ML IV SOLN
INTRAVENOUS | Status: AC
  Filled 2018-04-29: qty 50

## 2018-04-29 MED ORDER — SUCCINYLCHOLINE CHLORIDE 20 MG/ML IJ SOLN
INTRAMUSCULAR | Status: DC | PRN
  Administered 2018-04-29: 100 mg via INTRAVENOUS

## 2018-04-29 MED ORDER — FENTANYL CITRATE (PF) 250 MCG/5ML IJ SOLN
INTRAMUSCULAR | Status: AC
  Filled 2018-04-29: qty 25

## 2018-04-29 MED ORDER — PROPOFOL 10 MG/ML IV BOLUS
INTRAVENOUS | Status: AC
  Filled 2018-04-29: qty 20

## 2018-04-29 MED ORDER — SODIUM CHLORIDE 0.9 % IV SOLN
INTRAVENOUS | Status: DC | PRN
  Administered 2018-04-29: 1.1 [IU]/h via INTRAVENOUS

## 2018-04-29 MED ORDER — LACTATED RINGERS IV SOLN
INTRAVENOUS | Status: DC
  Administered 2018-04-29 – 2018-05-01 (×2): via INTRAVENOUS

## 2018-04-29 MED ORDER — BISACODYL 10 MG RE SUPP: 10.0000 mg | Freq: Every day | RECTAL | Status: DC

## 2018-04-29 MED ORDER — FENTANYL CITRATE (PF) 100 MCG/2ML IJ SOLN
INTRAMUSCULAR | Status: DC | PRN
  Administered 2018-04-29 (×2): 150 ug via INTRAVENOUS
  Administered 2018-04-29: 250 ug via INTRAVENOUS
  Administered 2018-04-29: 100 ug via INTRAVENOUS
  Administered 2018-04-29: 150 ug via INTRAVENOUS
  Administered 2018-04-29: 50 ug via INTRAVENOUS
  Administered 2018-04-29: 200 ug via INTRAVENOUS
  Administered 2018-04-29: 150 ug via INTRAVENOUS
  Administered 2018-04-29: 50 ug via INTRAVENOUS

## 2018-04-29 MED ORDER — PHENYLEPHRINE HCL 10 MG/ML IJ SOLN
INTRAMUSCULAR | Status: DC | PRN
  Administered 2018-04-29: 80 ug via INTRAVENOUS

## 2018-04-29 MED ORDER — ACETAMINOPHEN 160 MG/5ML PO SOLN: 650.0000 mg | Freq: Once | ORAL | Status: AC

## 2018-04-29 MED ORDER — ARTIFICIAL TEARS OPHTHALMIC OINT
TOPICAL_OINTMENT | OPHTHALMIC | Status: DC | PRN
  Administered 2018-04-29: 1 via OPHTHALMIC

## 2018-04-29 MED ORDER — INSULIN REGULAR BOLUS VIA INFUSION
0.0000 [IU] | Freq: Three times a day (TID) | INTRAVENOUS | Status: DC
  Filled 2018-04-29: qty 10

## 2018-04-29 MED ORDER — BISACODYL 5 MG PO TBEC
10.0000 mg | DELAYED_RELEASE_TABLET | Freq: Every day | ORAL | Status: DC
  Administered 2018-04-30 – 2018-05-04 (×4): 10 mg via ORAL
  Filled 2018-04-29 (×7): qty 2

## 2018-04-29 MED ORDER — DOCUSATE SODIUM 100 MG PO CAPS
200.0000 mg | ORAL_CAPSULE | Freq: Every day | ORAL | Status: DC
  Administered 2018-04-30 – 2018-05-07 (×5): 200 mg via ORAL
  Filled 2018-04-29 (×6): qty 2

## 2018-04-29 MED ORDER — PROPOFOL 10 MG/ML IV BOLUS
INTRAVENOUS | Status: DC | PRN
  Administered 2018-04-29: 100 mg via INTRAVENOUS

## 2018-04-29 MED ORDER — PROTAMINE SULFATE 10 MG/ML IV SOLN
INTRAVENOUS | Status: AC
  Filled 2018-04-29: qty 10

## 2018-04-29 MED ORDER — ALBUMIN HUMAN 5 % IV SOLN
250.0000 mL | INTRAVENOUS | Status: AC | PRN
  Administered 2018-04-29: 12.5 g via INTRAVENOUS

## 2018-04-29 MED ORDER — INSULIN REGULAR(HUMAN) IN NACL 100-0.9 UT/100ML-% IV SOLN: INTRAVENOUS | Status: DC

## 2018-04-29 SURGICAL SUPPLY — 114 items
ADAPTER CARDIO PERF ANTE/RETRO (ADAPTER) ×4 IMPLANT
BAG DECANTER FOR FLEXI CONT (MISCELLANEOUS) ×4 IMPLANT
BANDAGE ACE 4X5 VEL STRL LF (GAUZE/BANDAGES/DRESSINGS) ×4 IMPLANT
BANDAGE ACE 6X5 VEL STRL LF (GAUZE/BANDAGES/DRESSINGS) ×4 IMPLANT
BANDAGE ELASTIC 4 VELCRO ST LF (GAUZE/BANDAGES/DRESSINGS) ×2 IMPLANT
BANDAGE ELASTIC 6 VELCRO ST LF (GAUZE/BANDAGES/DRESSINGS) ×2 IMPLANT
BASKET HEART  (ORDER IN 25'S) (MISCELLANEOUS) ×1
BASKET HEART (ORDER IN 25'S) (MISCELLANEOUS) ×1
BASKET HEART (ORDER IN 25S) (MISCELLANEOUS) ×2 IMPLANT
BLADE CLIPPER SURG (BLADE) IMPLANT
BLADE STERNUM SYSTEM 6 (BLADE) ×4 IMPLANT
BLADE SURG 11 STRL SS (BLADE) ×2 IMPLANT
BLADE SURG 12 STRL SS (BLADE) ×4 IMPLANT
BLADE SURG 15 STRL LF DISP TIS (BLADE) ×2 IMPLANT
BLADE SURG 15 STRL SS (BLADE) ×2
BNDG GAUZE ELAST 4 BULKY (GAUZE/BANDAGES/DRESSINGS) ×4 IMPLANT
CANISTER SUCT 3000ML PPV (MISCELLANEOUS) ×4 IMPLANT
CANNULA ARTERIAL NVNT 3/8 24FR (CANNULA) ×2 IMPLANT
CANNULA GUNDRY RCSP 15FR (MISCELLANEOUS) ×4 IMPLANT
CATH CPB KIT VANTRIGT (MISCELLANEOUS) ×4 IMPLANT
CATH HEART VENT LEFT (CATHETERS) ×2 IMPLANT
CATH RETROPLEGIA CORONARY 14FR (CATHETERS) IMPLANT
CATH ROBINSON RED A/P 18FR (CATHETERS) ×12 IMPLANT
CATH THORACIC 36FR RT ANG (CATHETERS) ×4 IMPLANT
CLIP FOGARTY SPRING 6M (CLIP) IMPLANT
CLIP VESOCCLUDE SM WIDE 24/CT (CLIP) ×4 IMPLANT
CONT SPEC 4OZ CLIKSEAL STRL BL (MISCELLANEOUS) ×2 IMPLANT
COVER SURGICAL LIGHT HANDLE (MISCELLANEOUS) ×8 IMPLANT
COVER WAND RF STERILE (DRAPES) ×4 IMPLANT
CRADLE DONUT ADULT HEAD (MISCELLANEOUS) ×4 IMPLANT
DRAIN CHANNEL 32F RND 10.7 FF (WOUND CARE) ×4 IMPLANT
DRAPE CARDIOVASCULAR INCISE (DRAPES) ×2
DRAPE SLUSH/WARMER DISC (DRAPES) ×4 IMPLANT
DRAPE SRG 135X102X78XABS (DRAPES) ×2 IMPLANT
DRSG AQUACEL AG ADV 3.5X14 (GAUZE/BANDAGES/DRESSINGS) ×4 IMPLANT
ELECT BLADE 4.0 EZ CLEAN MEGAD (MISCELLANEOUS) ×4
ELECT BLADE 6.5 EXT (BLADE) ×4 IMPLANT
ELECT CAUTERY BLADE 6.4 (BLADE) ×4 IMPLANT
ELECT REM PT RETURN 9FT ADLT (ELECTROSURGICAL) ×8
ELECTRODE BLDE 4.0 EZ CLN MEGD (MISCELLANEOUS) ×2 IMPLANT
ELECTRODE REM PT RTRN 9FT ADLT (ELECTROSURGICAL) ×4 IMPLANT
FELT TEFLON 1X6 (MISCELLANEOUS) ×8 IMPLANT
GAUZE SPONGE 4X4 12PLY STRL (GAUZE/BANDAGES/DRESSINGS) ×8 IMPLANT
GAUZE SPONGE 4X4 12PLY STRL LF (GAUZE/BANDAGES/DRESSINGS) ×4 IMPLANT
GLOVE BIO SURGEON STRL SZ 6 (GLOVE) ×10 IMPLANT
GLOVE BIO SURGEON STRL SZ7.5 (GLOVE) ×12 IMPLANT
GOWN STRL REUS W/ TWL LRG LVL3 (GOWN DISPOSABLE) ×8 IMPLANT
GOWN STRL REUS W/TWL LRG LVL3 (GOWN DISPOSABLE) ×16
HEMOSTAT POWDER SURGIFOAM 1G (HEMOSTASIS) ×12 IMPLANT
HEMOSTAT SURGICEL 2X14 (HEMOSTASIS) ×6 IMPLANT
INSERT FOGARTY XLG (MISCELLANEOUS) ×2 IMPLANT
KIT BASIN OR (CUSTOM PROCEDURE TRAY) ×4 IMPLANT
KIT SUCTION CATH 14FR (SUCTIONS) ×4 IMPLANT
KIT TURNOVER KIT B (KITS) ×4 IMPLANT
KIT VASOVIEW HEMOPRO 2 VH 4000 (KITS) ×4 IMPLANT
LEAD PACING MYOCARDI (MISCELLANEOUS) ×4 IMPLANT
LINE VENT (MISCELLANEOUS) ×2 IMPLANT
MARKER GRAFT CORONARY BYPASS (MISCELLANEOUS) ×12 IMPLANT
NS IRRIG 1000ML POUR BTL (IV SOLUTION) ×24 IMPLANT
PACK E OPEN HEART (SUTURE) ×4 IMPLANT
PACK OPEN HEART (CUSTOM PROCEDURE TRAY) ×4 IMPLANT
PAD ARMBOARD 7.5X6 YLW CONV (MISCELLANEOUS) ×8 IMPLANT
PAD ELECT DEFIB RADIOL ZOLL (MISCELLANEOUS) ×4 IMPLANT
PENCIL BUTTON HOLSTER BLD 10FT (ELECTRODE) ×4 IMPLANT
PUNCH AORTIC ROTATE  4.5MM 8IN (MISCELLANEOUS) ×2 IMPLANT
PUNCH AORTIC ROTATE 4.0MM (MISCELLANEOUS) IMPLANT
PUNCH AORTIC ROTATE 4.5MM 8IN (MISCELLANEOUS) IMPLANT
PUNCH AORTIC ROTATE 5MM 8IN (MISCELLANEOUS) IMPLANT
SET CARDIOPLEGIA MPS 5001102 (MISCELLANEOUS) ×2 IMPLANT
SOLUTION ANTI FOG 6CC (MISCELLANEOUS) ×2 IMPLANT
SURGIFLO W/THROMBIN 8M KIT (HEMOSTASIS) ×6 IMPLANT
SUT BONE WAX W31G (SUTURE) ×4 IMPLANT
SUT ETHIBON 2 0 V 52N 30 (SUTURE) ×12 IMPLANT
SUT ETHIBON EXCEL 2-0 V-5 (SUTURE) ×4 IMPLANT
SUT ETHIBOND 2 0 SH (SUTURE) ×2
SUT ETHIBOND 2 0 SH 36X2 (SUTURE) ×2 IMPLANT
SUT ETHIBOND V-5 VALVE (SUTURE) ×4 IMPLANT
SUT MNCRL AB 4-0 PS2 18 (SUTURE) ×2 IMPLANT
SUT PROLENE 3 0 RB 1 (SUTURE) ×4 IMPLANT
SUT PROLENE 3 0 SH 1 (SUTURE) IMPLANT
SUT PROLENE 3 0 SH DA (SUTURE) ×4 IMPLANT
SUT PROLENE 3 0 SH1 36 (SUTURE) IMPLANT
SUT PROLENE 4 0 RB 1 (SUTURE) ×14
SUT PROLENE 4 0 SH DA (SUTURE) ×12 IMPLANT
SUT PROLENE 4-0 RB1 .5 CRCL 36 (SUTURE) ×6 IMPLANT
SUT PROLENE 5 0 C 1 36 (SUTURE) IMPLANT
SUT PROLENE 6 0 C 1 30 (SUTURE) ×8 IMPLANT
SUT PROLENE 6 0 CC (SUTURE) ×12 IMPLANT
SUT PROLENE 8 0 BV175 6 (SUTURE) IMPLANT
SUT PROLENE BLUE 7 0 (SUTURE) ×4 IMPLANT
SUT SILK  1 MH (SUTURE)
SUT SILK 1 MH (SUTURE) IMPLANT
SUT SILK 2 0 SH CR/8 (SUTURE) ×2 IMPLANT
SUT SILK 3 0 SH CR/8 (SUTURE) IMPLANT
SUT STEEL 6MS V (SUTURE) ×8 IMPLANT
SUT STEEL SZ 6 DBL 3X14 BALL (SUTURE) ×4 IMPLANT
SUT VIC AB 1 CTX 36 (SUTURE) ×4
SUT VIC AB 1 CTX36XBRD ANBCTR (SUTURE) ×4 IMPLANT
SUT VIC AB 2-0 CT1 27 (SUTURE) ×2
SUT VIC AB 2-0 CT1 TAPERPNT 27 (SUTURE) IMPLANT
SUT VIC AB 2-0 CTX 27 (SUTURE) IMPLANT
SUT VIC AB 3-0 X1 27 (SUTURE) IMPLANT
SYR 10ML KIT SKIN ADHESIVE (MISCELLANEOUS) IMPLANT
SYSTEM SAHARA CHEST DRAIN ATS (WOUND CARE) ×4 IMPLANT
TAPE CLOTH SURG 4X10 WHT LF (GAUZE/BANDAGES/DRESSINGS) ×4 IMPLANT
TAPE PAPER 2X10 WHT MICROPORE (GAUZE/BANDAGES/DRESSINGS) ×4 IMPLANT
TOWEL GREEN STERILE (TOWEL DISPOSABLE) ×4 IMPLANT
TOWEL GREEN STERILE FF (TOWEL DISPOSABLE) ×4 IMPLANT
TRAY FOLEY SLVR 16FR TEMP STAT (SET/KITS/TRAYS/PACK) ×4 IMPLANT
TUBING INSUFFLATION (TUBING) ×4 IMPLANT
UNDERPAD 30X30 (UNDERPADS AND DIAPERS) ×4 IMPLANT
VALVE AORTIC SZ25 INSP/RESIL (Prosthesis & Implant Heart) ×2 IMPLANT
VENT LEFT HEART 12002 (CATHETERS) ×8
WATER STERILE IRR 1000ML POUR (IV SOLUTION) ×8 IMPLANT

## 2018-04-29 NOTE — Progress Notes (Signed)
      [address]       [place] [address]             [phone]      S/p AVR CABG Just back from OR BP (!) 93/45   Pulse 92   Temp 97.7 F (36.5 C) (Oral)   Resp (!) 22   Ht 6' (1.829 m)   Wt 101.8 kg   SpO2 96%   BMI 30.45 kg/m  25/18, CI= 2.2  Intake/Output Summary (Last 24 hours) at 04/29/2018 1730 Last data filed at 04/29/2018 1624 Gross per 24 hour  Intake 7988.9 ml  Output 2905 ml  Net 5083.9 ml   Minimal CT output Postop labs pending  Remo Lipps C. Roxan Hockey, MD [facility] [phone]

## 2018-04-29 NOTE — Progress Notes (Signed)
   04/29/18 1400  Clinical Encounter Type  Visited With Patient not available;Family  Visit Type Follow-up;Social support;Patient in surgery  Referral From Chaplain  Spiritual Encounters  Spiritual Needs Emotional  Stress Factors  Patient Stress Factors Not reviewed  Family Stress Factors Loss of control   F/u visit w/ pt's spouse in 2nd floor surgery waiting room at request of another chaplain resident and of head of Spiritual Care dept.  Listened empathetically to concerns as she waited during multi-hour surgery.  Will brief overnight call chaplain to check in.  Myra Gianotti resident, [phone]

## 2018-04-29 NOTE — Progress Notes (Signed)
   04/29/18 1000  Clinical Encounter Type  Visited With Family  Visit Type Follow-up  Spiritual Encounters  Spiritual Needs Emotional  Responded to follow-up request. Patient wanted someone to check on his wife during his surgery. Met patient wife and escorted her to breakfast. Wife gave me some papers to copy for her. I made two copies for wife and returned them to her. Wife indicated that husband will be in surgery until 1:00 for triple bypass. Will follow-up as needed. Provided emotional support during husband's surgery.

## 2018-04-29 NOTE — Procedures (Signed)
Extubation Procedure Note  Patient Details:   Name: Lawrence Hall DOB: June 20, 1945 MRN: [id number]   Airway Documentation:    Vent end date: 04/29/18 Vent end time: 2240   Evaluation  O2 sats: stable throughout Complications: No apparent complications Patient did tolerate procedure well. Bilateral Breath Sounds: Clear, Diminished   Yes  Prior to extubation pts NIF 25, VC 1.1L, cuff leak present,  ABG results below. Post extubation pt had no stridor and was able to verbalize his full name without difficulty, pt placed on [place] 4 Lpm. RT will continue to monitor.     Ref. Range 04/29/2018 22:29  Sample type Unknown ARTERIAL  pH, Arterial Latest Ref Range: 7.350 - 7.450  7.366  pCO2 arterial Latest Ref Range: 32.0 - 48.0 mmHg 39.7  pO2, Arterial Latest Ref Range: 83.0 - 108.0 mmHg 118.0 (H)  TCO2 Latest Ref Range: 22 - 32 mmol/L 24  Acid-base deficit Latest Ref Range: 0.0 - 2.0 mmol/L 2.0  Bicarbonate Latest Ref Range: 20.0 - 28.0 mmol/L 22.6  O2 Saturation Latest Units: % 98.0  Patient temperature Unknown 37.5 C     [name] [name] 04/29/2018, 10:48 PM

## 2018-04-29 NOTE — Anesthesia Procedure Notes (Signed)
Central Venous Catheter Insertion Performed by: Lillia Abed, MD, anesthesiologist Start/End1/01/2019 7:10 AM, 04/29/2018 7:20 AM Patient location: Pre-op. Preanesthetic checklist: patient identified, IV checked, risks and benefits discussed, surgical consent, monitors and equipment checked, pre-op evaluation, timeout performed and anesthesia consent Position: Trendelenburg Lidocaine 1% used for infiltration and patient sedated Hand hygiene performed  and maximum sterile barriers used  Catheter size: 8.5 Fr Central line and PA cath was placed.MAC introducer Swan type:thermodilution Procedure performed using ultrasound guided technique. Ultrasound Notes:anatomy identified, needle tip was noted to be adjacent to the nerve/plexus identified, no ultrasound evidence of intravascular and/or intraneural injection and image(s) printed for medical record Attempts: 1 Following insertion, line sutured, dressing applied and Biopatch. Post procedure assessment: blood return through all ports, free fluid flow and no air  Patient tolerated the procedure well with no immediate complications.

## 2018-04-29 NOTE — Plan of Care (Signed)
  Problem: Activity: Goal: Ability to tolerate increased activity will improve Outcome: Progressing Note:  Ambulated in hallway without difficulty.   Problem: Clinical Measurements: Goal: Will remain free from infection Outcome: Progressing Note:  No s/s of infection noted.  Pre-op peridex mouthwash and hibiclens bath completed last pm. Goal: Respiratory complications will improve Outcome: Progressing Note:  No s/s of respiratory complications noted.  Stable on room air.   Problem: Education: Goal: Knowledge of General Education information will improve Description Including pain rating scale, medication(s)/side effects and non-pharmacologic comfort measures Outcome: Completed/Met

## 2018-04-29 NOTE — Brief Op Note (Signed)
04/26/2018 - 04/29/2018  7:28 AM  PATIENT:  Lawrence Hall  73 y.o. male  PRE-OPERATIVE DIAGNOSIS:  CAD AS  POST-OPERATIVE DIAGNOSIS:  CAD AS  PROCEDURE:  Procedure(s): CORONARY ARTERY BYPASS GRAFTING (CABG) X 3; Using Left Internal Mammary Artery (LIMA) and Right Leg Greater Saphenous Vein Graft (SVG);  LIMA to LAD, SVG to OM1, SVG to RCA, (N/A) AORTIC VALVE REPLACEMENT (AVR) using INSPIRIS RESILIA  AORTIC VALVE 28mm (N/A) TRANSESOPHAGEAL ECHOCARDIOGRAM (TEE) (N/A)  LIMA to LAD SVG to OM1 SVG to RCA  SURGEON:  Surgeon(s) and Role:    Ivin Poot, MD - Primary  PHYSICIAN ASSISTANT:  Nicholes Rough, PA-C   ANESTHESIA:   general  EBL:  880 mL   BLOOD ADMINISTERED:none  DRAINS: ROUTINE   LOCAL MEDICATIONS USED:  NONE  SPECIMEN:  Source of Specimen:  AORTIC VALVE LEAFLETS  DISPOSITION OF SPECIMEN:  PATHOLOGY  COUNTS:  YES  DICTATION: .Dragon Dictation  PLAN OF CARE: Admit to inpatient   PATIENT DISPOSITION:  ICU - intubated and hemodynamically stable.   Delay start of Pharmacological VTE agent (>24hrs) due to surgical blood loss or risk of bleeding: yes

## 2018-04-29 NOTE — Progress Notes (Signed)
Pre Procedure note for inpatients:   Lawrence Hall has been scheduled for Procedure(s): CORONARY ARTERY BYPASS GRAFTING (CABG) (N/A) AORTIC VALVE REPLACEMENT (AVR) (N/A) TRANSESOPHAGEAL ECHOCARDIOGRAM (TEE) (N/A) today. The various methods of treatment have been discussed with the patient. After consideration of the risks, benefits and treatment options the patient has consented to the planned procedure.   The patient has been seen and labs reviewed. There are no changes in the patient's condition to prevent proceeding with the planned procedure today.  Recent labs:  Lab Results  Component Value Date   WBC 5.7 04/29/2018   HGB 12.7 (L) 04/29/2018   HCT 37.1 (L) 04/29/2018   PLT 180 04/29/2018   GLUCOSE 103 (H) 04/29/2018   CHOL 223 (H) 06/01/2015   TRIG 258 (H) 06/01/2015   HDL 45 06/01/2015   LDLCALC 126 (H) 06/01/2015   ALT 16 04/28/2018   AST 17 04/28/2018   NA 137 04/29/2018   K 3.7 04/29/2018   CL 108 04/29/2018   CREATININE 1.05 04/29/2018   BUN 17 04/29/2018   CO2 20 (L) 04/29/2018   TSH 5.037 (H) 04/28/2018   INR 1.07 04/28/2018   HGBA1C 5.4 04/28/2018    Len Childs, MD 04/29/2018 7:41 AM

## 2018-04-29 NOTE — Anesthesia Procedure Notes (Signed)
Arterial Line Insertion Start/End1/01/2019 7:39 AM Performed by: Julieta Bellini, CRNA  Patient location: Pre-op. Preanesthetic checklist: patient identified, IV checked, site marked, risks and benefits discussed, surgical consent, monitors and equipment checked, pre-op evaluation, timeout performed and anesthesia consent Lidocaine 1% used for infiltration Left, radial was placed Catheter size: 20 G Hand hygiene performed  and maximum sterile barriers used   Attempts: 1 Procedure performed without using ultrasound guided technique. Following insertion, dressing applied and Biopatch. Post procedure assessment: normal  Patient tolerated the procedure well with no immediate complications.

## 2018-04-29 NOTE — Anesthesia Postprocedure Evaluation (Signed)
Anesthesia Post Note  Patient: KIYAAN HAQ  Procedure(s) Performed: CORONARY ARTERY BYPASS GRAFTING (CABG) X 3; Using Left Internal Mammary Artery (LIMA) and Right Leg Greater Saphenous Vein Graft (SVG);  LIMA to LAD, SVG to OM1, SVG to RCA, (N/A Chest) AORTIC VALVE REPLACEMENT (AVR) using INSPIRIS RESILIA  AORTIC VALVE 52mm (N/A Chest) TRANSESOPHAGEAL ECHOCARDIOGRAM (TEE) (N/A )     Patient location during evaluation: ICU Anesthesia Type: General Level of consciousness: sedated Pain management: pain level controlled Vital Signs Assessment: post-procedure vital signs reviewed and stable Respiratory status: patient remains intubated per anesthesia plan Cardiovascular status: stable Postop Assessment: no apparent nausea or vomiting Anesthetic complications: no    Last Vitals:  Vitals:   04/29/18 0903 04/29/18 1657  BP:  (!) 93/45  Pulse: 72 92  Resp:  (!) 22  Temp:    SpO2: 100% 96%    Last Pain:  Vitals:   04/29/18 0545  TempSrc: Oral  PainSc:                  Ryan P Ellender

## 2018-04-29 NOTE — Progress Notes (Signed)
RT assessed pts readiness to wean post CABG. Pt able to hold head off of pillow 20+ seconds, follow commands when asked to complete a task. RT started Heart wean with SIMV/PRVC/PSV at VT 620, RR 4, 10 PS and FIO2 decreased to 40% per protocol for wean. RT will continue to monitor.

## 2018-04-29 NOTE — Transfer of Care (Signed)
Immediate Anesthesia Transfer of Care Note  Patient: Lawrence Hall  Procedure(s) Performed: CORONARY ARTERY BYPASS GRAFTING (CABG) X 3; Using Left Internal Mammary Artery (LIMA) and Right Leg Greater Saphenous Vein Graft (SVG);  LIMA to LAD, SVG to OM1, SVG to RCA, (N/A Chest) AORTIC VALVE REPLACEMENT (AVR) using INSPIRIS RESILIA  AORTIC VALVE 25mm (N/A Chest) TRANSESOPHAGEAL ECHOCARDIOGRAM (TEE) (N/A )  Patient Location: PACU  Anesthesia Type:General  Level of Consciousness: sedated, unresponsive and Patient remains intubated per anesthesia plan  Airway & Oxygen Therapy: Patient remains intubated per anesthesia plan and Patient placed on Ventilator (see vital sign flow sheet for setting)  Post-op Assessment: Report given to RN and Post -op Vital signs reviewed and stable  Post vital signs: Reviewed and stable  Last Vitals:  Vitals Value Taken Time  BP 93/45 04/29/2018  4:57 PM  Temp    Pulse 92 04/29/2018  4:57 PM  Resp 22 04/29/2018  4:57 PM  SpO2 96 % 04/29/2018  4:57 PM    Last Pain:  Vitals:   04/29/18 0545  TempSrc: Oral  PainSc:       Patients Stated Pain Goal: 0 (02/58/52 7782)  Complications: No apparent anesthesia complications

## 2018-04-29 NOTE — Anesthesia Procedure Notes (Signed)
Procedure Name: Intubation Date/Time: 04/29/2018 9:44 AM Performed by: Julieta Bellini, CRNA Pre-anesthesia Checklist: Patient identified, Emergency Drugs available, Suction available and Patient being monitored Patient Re-evaluated:Patient Re-evaluated prior to induction Oxygen Delivery Method: Circle system utilized Preoxygenation: Pre-oxygenation with 100% oxygen Induction Type: IV induction and Rapid sequence Laryngoscope Size: Glidescope and 4 Grade View: Grade I Tube type: Oral Tube size: 7.5 mm Number of attempts: 1 Airway Equipment and Method: Video-laryngoscopy and Rigid stylet Placement Confirmation: ETT inserted through vocal cords under direct vision,  positive ETCO2 and breath sounds checked- equal and bilateral Secured at: 23 cm Tube secured with: Tape Dental Injury: Teeth and Oropharynx as per pre-operative assessment

## 2018-04-29 NOTE — Progress Notes (Signed)
  Echocardiogram Echocardiogram Transesophageal has been performed.  Lawrence Hall M 04/29/2018, 9:59 AM

## 2018-04-29 NOTE — Progress Notes (Signed)
RT assessed pt for readiness to wean to PS/CPAP. Pt able to hold head up off of pillow for 20+ seconds, follow commands when asked to complete a task. RT started heart wean on PS/CPAP 10/5 and 40% FIO2. RN will obtain ABG at 2225 to assess pts readiness for extubation. RT will then have pt perform VC and NIF. RT will continue to monitor.

## 2018-04-30 ENCOUNTER — Inpatient Hospital Stay (HOSPITAL_COMMUNITY): Payer: PPO

## 2018-04-30 LAB — CBC
HCT: 27.7 % — ABNORMAL LOW (ref 39.0–52.0)
HEMATOCRIT: 26.7 % — AB (ref 39.0–52.0)
Hemoglobin: 9.3 g/dL — ABNORMAL LOW (ref 13.0–17.0)
Hemoglobin: 9.1 g/dL — ABNORMAL LOW (ref 13.0–17.0)
MCH: 30 pg (ref 26.0–34.0)
MCH: 28.5 pg (ref 26.0–34.0)
MCHC: 34.8 g/dL (ref 30.0–36.0)
MCHC: 32.9 g/dL (ref 30.0–36.0)
MCV: 86.1 fL (ref 80.0–100.0)
MCV: 86.8 fL (ref 80.0–100.0)
PLATELETS: UNDETERMINED 10*3/uL (ref 150–400)
Platelets: 126 10*3/uL — ABNORMAL LOW (ref 150–400)
RBC: 3.1 MIL/uL — ABNORMAL LOW (ref 4.22–5.81)
RBC: 3.19 MIL/uL — ABNORMAL LOW (ref 4.22–5.81)
RDW: 13.2 % (ref 11.5–15.5)
RDW: 13.6 % (ref 11.5–15.5)
WBC: 11.5 10*3/uL — ABNORMAL HIGH (ref 4.0–10.5)
WBC: 12.5 10*3/uL — ABNORMAL HIGH (ref 4.0–10.5)
nRBC: 0 % (ref 0.0–0.2)
nRBC: 0 % (ref 0.0–0.2)

## 2018-04-30 LAB — BASIC METABOLIC PANEL
Anion gap: 7 (ref 5–15)
BUN: 18 mg/dL (ref 8–23)
CO2: 20 mmol/L — AB (ref 22–32)
CREATININE: 1.19 mg/dL (ref 0.61–1.24)
Calcium: 7.7 mg/dL — ABNORMAL LOW (ref 8.9–10.3)
Chloride: 111 mmol/L (ref 98–111)
GFR calc Af Amer: >60 mL/min (ref >60)
GFR calc non Af Amer: 60 mL/min — ABNORMAL LOW (ref >60)
Glucose, Bld: 133 mg/dL — ABNORMAL HIGH (ref 70–99)
Potassium: 4.1 mmol/L (ref 3.5–5.1)
Sodium: 138 mmol/L (ref 135–145)

## 2018-04-30 LAB — POCT I-STAT, CHEM 8
BUN: 23 mg/dL (ref 8–23)
CALCIUM ION: 1.15 mmol/L (ref 1.15–1.40)
Chloride: 105 mmol/L (ref 98–111)
Creatinine, Ser: 1.3 mg/dL — ABNORMAL HIGH (ref 0.61–1.24)
Glucose, Bld: 169 mg/dL — ABNORMAL HIGH (ref 70–99)
HCT: 25 % — ABNORMAL LOW (ref 39.0–52.0)
Hemoglobin: 8.5 g/dL — ABNORMAL LOW (ref 13.0–17.0)
Potassium: 4.1 mmol/L (ref 3.5–5.1)
Sodium: 138 mmol/L (ref 135–145)
TCO2: 22 mmol/L (ref 22–32)

## 2018-04-30 LAB — GLUCOSE, CAPILLARY
GLUCOSE-CAPILLARY: 149 mg/dL — AB (ref 70–99)
GLUCOSE-CAPILLARY: 165 mg/dL — AB (ref 70–99)
GLUCOSE-CAPILLARY: 157 mg/dL — AB (ref 70–99)
Glucose-Capillary: 144 mg/dL — ABNORMAL HIGH (ref 70–99)
Glucose-Capillary: 124 mg/dL — ABNORMAL HIGH (ref 70–99)
Glucose-Capillary: 124 mg/dL — ABNORMAL HIGH (ref 70–99)
Glucose-Capillary: 115 mg/dL — ABNORMAL HIGH (ref 70–99)
Glucose-Capillary: 137 mg/dL — ABNORMAL HIGH (ref 70–99)
Glucose-Capillary: 140 mg/dL — ABNORMAL HIGH (ref 70–99)
Glucose-Capillary: 148 mg/dL — ABNORMAL HIGH (ref 70–99)
Glucose-Capillary: 142 mg/dL — ABNORMAL HIGH (ref 70–99)
Glucose-Capillary: 130 mg/dL — ABNORMAL HIGH (ref 70–99)
Glucose-Capillary: 176 mg/dL — ABNORMAL HIGH (ref 70–99)
Glucose-Capillary: 146 mg/dL — ABNORMAL HIGH (ref 70–99)
Glucose-Capillary: 152 mg/dL — ABNORMAL HIGH (ref 70–99)

## 2018-04-30 LAB — PREPARE FRESH FROZEN PLASMA: Unit division: 0

## 2018-04-30 LAB — BPAM FFP
Blood Product Expiration Date: 202001132359
Blood Product Expiration Date: 202001142359
ISSUE DATE / TIME: 202001101437
ISSUE DATE / TIME: 202001101437
Unit Type and Rh: 6200
Unit Type and Rh: 6200

## 2018-04-30 LAB — CREATININE, SERUM
CREATININE: 1.39 mg/dL — AB (ref 0.61–1.24)
GFR calc Af Amer: 57 mL/min — ABNORMAL LOW (ref >60)
GFR, EST NON AFRICAN AMERICAN: 50 mL/min — AB (ref >60)

## 2018-04-30 LAB — POCT I-STAT 3, ART BLOOD GAS (G3+)
Acid-base deficit: 4 mmol/L — ABNORMAL HIGH (ref 0.0–2.0)
Bicarbonate: 21.4 mmol/L (ref 20.0–28.0)
O2 SAT: 95 %
Patient temperature: 37.4
TCO2: 23 mmol/L (ref 22–32)
pCO2 arterial: 39.3 mmHg (ref 32.0–48.0)
pH, Arterial: 7.347 — ABNORMAL LOW (ref 7.350–7.450)
pO2, Arterial: 81 mmHg — ABNORMAL LOW (ref 83.0–108.0)

## 2018-04-30 LAB — MAGNESIUM
Magnesium: 2.7 mg/dL — ABNORMAL HIGH (ref 1.7–2.4)
Magnesium: 2.8 mg/dL — ABNORMAL HIGH (ref 1.7–2.4)

## 2018-04-30 MED ORDER — ENOXAPARIN SODIUM 40 MG/0.4ML ~~LOC~~ SOLN
40.0000 mg | Freq: Every day | SUBCUTANEOUS | Status: DC
  Administered 2018-04-30 – 2018-05-05 (×6): 40 mg via SUBCUTANEOUS
  Filled 2018-04-30 (×6): qty 0.4

## 2018-04-30 MED ORDER — INSULIN ASPART 100 UNIT/ML ~~LOC~~ SOLN
0.0000 [IU] | SUBCUTANEOUS | Status: DC
  Administered 2018-04-30 (×3): 2 [IU] via SUBCUTANEOUS
  Administered 2018-04-30: 4 [IU] via SUBCUTANEOUS
  Administered 2018-05-01: 2 [IU] via SUBCUTANEOUS

## 2018-04-30 MED ORDER — INSULIN DETEMIR 100 UNIT/ML ~~LOC~~ SOLN
20.0000 [IU] | Freq: Two times a day (BID) | SUBCUTANEOUS | Status: DC
  Administered 2018-04-30 – 2018-05-01 (×4): 20 [IU] via SUBCUTANEOUS
  Filled 2018-04-30 (×5): qty 0.2

## 2018-04-30 MED ORDER — ORAL CARE MOUTH RINSE
15.0000 mL | Freq: Two times a day (BID) | OROMUCOSAL | Status: DC
  Administered 2018-04-30 – 2018-05-07 (×7): 15 mL via OROMUCOSAL

## 2018-04-30 NOTE — Progress Notes (Addendum)
1 Day Post-Op Procedure(s) (LRB): CORONARY ARTERY BYPASS GRAFTING (CABG) X 3; Using Left Internal Mammary Artery (LIMA) and Right Leg Greater Saphenous Vein Graft (SVG);  LIMA to LAD, SVG to OM1, SVG to RCA, (N/A) AORTIC VALVE REPLACEMENT (AVR) using INSPIRIS RESILIA  AORTIC VALVE 68mm (N/A) TRANSESOPHAGEAL ECHOCARDIOGRAM (TEE) (N/A) Subjective: Incisional pain  Objective: Vital signs in last 24 hours: Temp:  [96.3 F (35.7 C)-100 F (37.8 C)] 99.3 F (37.4 C) (01/11 0800) Pulse Rate:  [60-100] 87 (01/11 0800) Cardiac Rhythm: Normal sinus rhythm;Bundle branch block (01/11 0300) Resp:  [0-36] 19 (01/11 0800) BP: (88-125)/(45-80) 110/59 (01/11 0800) SpO2:  [95 %-100 %] 97 % (01/11 0800) Arterial Line BP: (83-143)/(32-66) 131/43 (01/11 0800) FiO2 (%):  [40 %-50 %] 40 % (01/10 2205) Weight:  [106.6 kg] 106.6 kg (01/11 0500)  Hemodynamic parameters for last 24 hours: PAP: (14-45)/(6-24) 17/9 CO:  [4 L/min-7.3 L/min] 5.6 L/min CI:  [1.8 L/min/m2-3.3 L/min/m2] 2.5 L/min/m2  Intake/Output from previous day: 01/10 0701 - 01/11 0700 In: 6615.4 [I.V.:4389.6; Blood:1016; IV Piggyback:1209.9] Out: 4215 [Urine:2885; Blood:880; Chest Tube:450] Intake/Output this shift: No intake/output data recorded.  General appearance: alert, cooperative and no distress Neurologic: intact Heart: regular rate and rhythm Lungs: diminished breath sounds bibasilar Abdomen: distended, nontender, + BS  Lab Results: Recent Labs    04/29/18 2230 04/29/18 2235 04/30/18 0521  WBC 11.0*  --  11.5*  HGB 9.7* 9.9* 9.3*  HCT 28.3* 29.0* 26.7*  PLT 127*  --  126*   BMET:  Recent Labs    04/29/18 0518  04/29/18 2235 04/30/18 0521  NA 137   < > 139 138  K 3.7   < > 4.5 4.1  CL 108   < > 108 111  CO2 20*  --   --  20*  GLUCOSE 103*   < > 107* 133*  BUN 17   < > 17 18  CREATININE 1.05   < > 0.90 1.19  CALCIUM 8.7*  --   --  7.7*   < > = values in this interval not displayed.    PT/INR:  Recent  Labs    04/29/18 1715  LABPROT 16.8*  INR 1.38   ABG    Component Value Date/Time   PHART 7.347 (L) 04/30/2018 0000   HCO3 21.4 04/30/2018 0000   TCO2 23 04/30/2018 0000   ACIDBASEDEF 4.0 (H) 04/30/2018 0000   O2SAT 95.0 04/30/2018 0000   CBG (last 3)  Recent Labs    04/30/18 0603 04/30/18 0720 04/30/18 0816  GLUCAP 137* 140* 148*    Assessment/Plan: S/P Procedure(s) (LRB): CORONARY ARTERY BYPASS GRAFTING (CABG) X 3; Using Left Internal Mammary Artery (LIMA) and Right Leg Greater Saphenous Vein Graft (SVG);  LIMA to LAD, SVG to OM1, SVG to RCA, (N/A) AORTIC VALVE REPLACEMENT (AVR) using INSPIRIS RESILIA  AORTIC VALVE 27mm (N/A) TRANSESOPHAGEAL ECHOCARDIOGRAM (TEE) (N/A) -  CV- good hemodynamics- wean milrinone and neo  ECG shows LBBB postop  DC Swan after milrinone off  RESP- IS for left lower lobe atelectasis  RENAL- creatinine and lytes OK  Filling pressures low- no diuresis for now  ENDO- CBG well controlled with insulin drip- transition to levemir + SSI  SCD + enoxaparin for DVT prophylaxis  Anemia secondary to ABL- follow  DC mediastinal tube- leave pleural tube in place   LOS: 4 days    Lawrence Hall 04/30/2018

## 2018-04-30 NOTE — Progress Notes (Signed)
      [address]       [place] [address]             [phone]      POD # 1 AVR, CABG  Resting comfortably   BP 132/65   Pulse 90   Temp 98.3 F (36.8 C) (Oral)   Resp (!) 29   Ht 6' (1.829 m)   Wt 106.6 kg   SpO2 96%   BMI 31.87 kg/m   Intake/Output Summary (Last 24 hours) at 04/30/2018 1743 Last data filed at 04/30/2018 1700 Gross per 24 hour  Intake 2624.5 ml  Output 1680 ml  Net 944.5 ml   Creatinine 1.30, Hct= 27  Doing well POD # 1  Malaika Arnall C. Roxan Hockey, MD [facility] [phone]

## 2018-04-30 NOTE — Plan of Care (Signed)

## 2018-05-01 ENCOUNTER — Inpatient Hospital Stay (HOSPITAL_COMMUNITY): Payer: PPO

## 2018-05-01 LAB — BASIC METABOLIC PANEL
Anion gap: 6 (ref 5–15)
BUN: 27 mg/dL — ABNORMAL HIGH (ref 8–23)
CO2: 23 mmol/L (ref 22–32)
Calcium: 8 mg/dL — ABNORMAL LOW (ref 8.9–10.3)
Chloride: 109 mmol/L (ref 98–111)
Creatinine, Ser: 1.36 mg/dL — ABNORMAL HIGH (ref 0.61–1.24)
GFR calc Af Amer: 59 mL/min — ABNORMAL LOW (ref >60)
GFR calc non Af Amer: 51 mL/min — ABNORMAL LOW (ref >60)
GLUCOSE: 136 mg/dL — AB (ref 70–99)
Potassium: 4.5 mmol/L (ref 3.5–5.1)
Sodium: 138 mmol/L (ref 135–145)

## 2018-05-01 LAB — CBC
HEMATOCRIT: 27.6 % — AB (ref 39.0–52.0)
Hemoglobin: 9.1 g/dL — ABNORMAL LOW (ref 13.0–17.0)
MCH: 29 pg (ref 26.0–34.0)
MCHC: 33 g/dL (ref 30.0–36.0)
MCV: 87.9 fL (ref 80.0–100.0)
Platelets: 112 10*3/uL — ABNORMAL LOW (ref 150–400)
RBC: 3.14 MIL/uL — ABNORMAL LOW (ref 4.22–5.81)
RDW: 13.8 % (ref 11.5–15.5)
WBC: 15 10*3/uL — ABNORMAL HIGH (ref 4.0–10.5)
nRBC: 0 % (ref 0.0–0.2)

## 2018-05-01 LAB — GLUCOSE, CAPILLARY
GLUCOSE-CAPILLARY: 122 mg/dL — AB (ref 70–99)
Glucose-Capillary: 129 mg/dL — ABNORMAL HIGH (ref 70–99)
Glucose-Capillary: 131 mg/dL — ABNORMAL HIGH (ref 70–99)
Glucose-Capillary: 152 mg/dL — ABNORMAL HIGH (ref 70–99)
Glucose-Capillary: 100 mg/dL — ABNORMAL HIGH (ref 70–99)
Glucose-Capillary: 100 mg/dL — ABNORMAL HIGH (ref 70–99)

## 2018-05-01 MED ORDER — FUROSEMIDE 40 MG PO TABS
40.0000 mg | ORAL_TABLET | Freq: Every day | ORAL | Status: DC
  Administered 2018-05-01: 40 mg via ORAL
  Filled 2018-05-01: qty 1

## 2018-05-01 MED ORDER — POTASSIUM CHLORIDE CRYS ER 10 MEQ PO TBCR
20.0000 meq | EXTENDED_RELEASE_TABLET | Freq: Every day | ORAL | Status: DC
  Administered 2018-05-01 – 2018-05-07 (×7): 20 meq via ORAL
  Filled 2018-05-01 (×8): qty 2

## 2018-05-01 MED ORDER — INSULIN ASPART 100 UNIT/ML ~~LOC~~ SOLN
0.0000 [IU] | Freq: Three times a day (TID) | SUBCUTANEOUS | Status: DC
  Administered 2018-05-01: 3 [IU] via SUBCUTANEOUS

## 2018-05-01 NOTE — Progress Notes (Signed)
Spoke with RN re d/c CVC order.  RN aware. 

## 2018-05-01 NOTE — Progress Notes (Signed)
2 Days Post-Op Procedure(s) (LRB): CORONARY ARTERY BYPASS GRAFTING (CABG) X 3; Using Left Internal Mammary Artery (LIMA) and Right Leg Greater Saphenous Vein Graft (SVG);  LIMA to LAD, SVG to OM1, SVG to RCA, (N/A) AORTIC VALVE REPLACEMENT (AVR) using INSPIRIS RESILIA  AORTIC VALVE 74mm (N/A) TRANSESOPHAGEAL ECHOCARDIOGRAM (TEE) (N/A) Subjective: C/o belching  Objective: Vital signs in last 24 hours: Temp:  [97.6 F (36.4 C)-99.5 F (37.5 C)] 98 F (36.7 C) (01/12 0723) Pulse Rate:  [67-93] 82 (01/12 0700) Cardiac Rhythm: Normal sinus rhythm (01/12 0400) Resp:  [16-32] 23 (01/12 0700) BP: (91-143)/(56-74) 116/74 (01/12 0700) SpO2:  [86 %-98 %] 93 % (01/12 0700) Arterial Line BP: (118-237)/(34-229) 129/34 (01/11 1100) Weight:  [106.1 kg] 106.1 kg (01/12 0500)  Hemodynamic parameters for last 24 hours: PAP: (14-28)/(6-17) 28/17 CO:  [7.6 L/min-8.3 L/min] 8.3 L/min CI:  [3.4 L/min/m2-3.7 L/min/m2] 3.7 L/min/m2  Intake/Output from previous day: 01/11 0701 - 01/12 0700 In: 785.1 [P.O.:460; I.V.:225.2; IV Piggyback:99.9] Out: 1050 [Urine:680; Chest Tube:370] Intake/Output this shift: No intake/output data recorded.  General appearance: cooperative and no distress Neurologic: intact Heart: slightly irregular Lungs: diminished breath sounds bibasilar Abdomen: midlly distended, hypoactive BS  Lab Results: Recent Labs    04/30/18 1617 04/30/18 1618 05/01/18 0248  WBC 12.5*  --  15.0*  HGB 9.1* 8.5* 9.1*  HCT 27.7* 25.0* 27.6*  PLT PLATELET CLUMPS NOTED ON SMEAR, UNABLE TO ESTIMATE  --  112*   BMET:  Recent Labs    04/30/18 0521  04/30/18 1618 05/01/18 0248  NA 138  --  138 138  K 4.1  --  4.1 4.5  CL 111  --  105 109  CO2 20*  --   --  23  GLUCOSE 133*  --  169* 136*  BUN 18  --  23 27*  CREATININE 1.19   < > 1.30* 1.36*  CALCIUM 7.7*  --   --  8.0*   < > = values in this interval not displayed.    PT/INR:  Recent Labs    04/29/18 1715  LABPROT 16.8*  INR  1.38   ABG    Component Value Date/Time   PHART 7.347 (L) 04/30/2018 0000   HCO3 21.4 04/30/2018 0000   TCO2 22 04/30/2018 1618   ACIDBASEDEF 4.0 (H) 04/30/2018 0000   O2SAT 95.0 04/30/2018 0000   CBG (last 3)  Recent Labs    04/30/18 2123 04/30/18 2302 05/01/18 0335  GLUCAP 157* 152* 129*    Assessment/Plan: S/P Procedure(s) (LRB): CORONARY ARTERY BYPASS GRAFTING (CABG) X 3; Using Left Internal Mammary Artery (LIMA) and Right Leg Greater Saphenous Vein Graft (SVG);  LIMA to LAD, SVG to OM1, SVG to RCA, (N/A) AORTIC VALVE REPLACEMENT (AVR) using INSPIRIS RESILIA  AORTIC VALVE 57mm (N/A) TRANSESOPHAGEAL ECHOCARDIOGRAM (TEE) (N/A) -CV- in SR with PACs  RESP- bibasilar atelectasis and small effusions- continue IS  RENAL- creatinine stable, lytes OK  Volume overloaded- PO lasix  ENDO- CBG well controlled- change CBG/SSI to [facility] and HS  GI- belching. On Reglan, exam benign, advance diet as tolerated  Anemia secondary to ABL- mild, follow  Thrombocytopenia- follow  Enoxaparin + SCD for DVT prophylaxis  Mobilize  LOS: 5 days    Lawrence Hall 05/01/2018

## 2018-05-01 NOTE — Progress Notes (Signed)
      [address]       [place] [address]             [phone]      Sleeping presently BP 136/83   Pulse 89   Temp 97.6 F (36.4 C) (Oral)   Resp 15   Ht 6' (1.829 m)   Wt 106.1 kg   SpO2 93%   BMI 31.72 kg/m    Intake/Output Summary (Last 24 hours) at 05/01/2018 1819 Last data filed at 05/01/2018 1700 Gross per 24 hour  Intake 140 ml  Output 925 ml  Net -785 ml   CBG (last 3)  Recent Labs    05/01/18 0620 05/01/18 1115 05/01/18 1540  GLUCAP 131* 152* 100*   Continue current Rx  Steven C. Roxan Hockey, MD [facility] [phone]

## 2018-05-02 ENCOUNTER — Encounter (HOSPITAL_COMMUNITY): Payer: Self-pay | Admitting: Cardiothoracic Surgery

## 2018-05-02 ENCOUNTER — Inpatient Hospital Stay (HOSPITAL_COMMUNITY): Payer: PPO

## 2018-05-02 DIAGNOSIS — I2510 Atherosclerotic heart disease of native coronary artery without angina pectoris: Secondary | ICD-10-CM

## 2018-05-02 DIAGNOSIS — Z951 Presence of aortocoronary bypass graft: Secondary | ICD-10-CM

## 2018-05-02 LAB — TYPE AND SCREEN
ABO/RH(D): A POS
Antibody Screen: NEGATIVE
Unit division: 0
Unit division: 0

## 2018-05-02 LAB — CBC
HCT: 26.6 % — ABNORMAL LOW (ref 39.0–52.0)
Hemoglobin: 9 g/dL — ABNORMAL LOW (ref 13.0–17.0)
MCH: 29.7 pg (ref 26.0–34.0)
MCHC: 33.8 g/dL (ref 30.0–36.0)
MCV: 87.8 fL (ref 80.0–100.0)
Platelets: 119 10*3/uL — ABNORMAL LOW (ref 150–400)
RBC: 3.03 MIL/uL — ABNORMAL LOW (ref 4.22–5.81)
RDW: 13.9 % (ref 11.5–15.5)
WBC: 11.9 10*3/uL — ABNORMAL HIGH (ref 4.0–10.5)
nRBC: 0 % (ref 0.0–0.2)

## 2018-05-02 LAB — BPAM RBC
Blood Product Expiration Date: 202002022359
Blood Product Expiration Date: 202002022359
ISSUE DATE / TIME: 202001101030
ISSUE DATE / TIME: 202001101030
Unit Type and Rh: 6200
Unit Type and Rh: 6200

## 2018-05-02 LAB — BASIC METABOLIC PANEL
Anion gap: 7 (ref 5–15)
BUN: 34 mg/dL — ABNORMAL HIGH (ref 8–23)
CHLORIDE: 108 mmol/L (ref 98–111)
CO2: 23 mmol/L (ref 22–32)
Calcium: 8 mg/dL — ABNORMAL LOW (ref 8.9–10.3)
Creatinine, Ser: 1.38 mg/dL — ABNORMAL HIGH (ref 0.61–1.24)
GFR calc Af Amer: 58 mL/min — ABNORMAL LOW (ref >60)
GFR calc non Af Amer: 50 mL/min — ABNORMAL LOW (ref >60)
Glucose, Bld: 157 mg/dL — ABNORMAL HIGH (ref 70–99)
POTASSIUM: 4 mmol/L (ref 3.5–5.1)
Sodium: 138 mmol/L (ref 135–145)

## 2018-05-02 LAB — GLUCOSE, CAPILLARY
GLUCOSE-CAPILLARY: 134 mg/dL — AB (ref 70–99)
GLUCOSE-CAPILLARY: 95 mg/dL (ref 70–99)
Glucose-Capillary: 99 mg/dL (ref 70–99)
Glucose-Capillary: 89 mg/dL (ref 70–99)
Glucose-Capillary: 122 mg/dL — ABNORMAL HIGH (ref 70–99)

## 2018-05-02 LAB — MAGNESIUM: Magnesium: 2.7 mg/dL — ABNORMAL HIGH (ref 1.7–2.4)

## 2018-05-02 MED ORDER — METOPROLOL TARTRATE 25 MG PO TABS
25.0000 mg | ORAL_TABLET | Freq: Two times a day (BID) | ORAL | Status: DC
  Administered 2018-05-02 – 2018-05-03 (×3): 25 mg via ORAL
  Filled 2018-05-02 (×3): qty 1

## 2018-05-02 MED ORDER — INSULIN DETEMIR 100 UNIT/ML ~~LOC~~ SOLN
15.0000 [IU] | Freq: Two times a day (BID) | SUBCUTANEOUS | Status: DC
  Administered 2018-05-02 – 2018-05-03 (×4): 15 [IU] via SUBCUTANEOUS
  Filled 2018-05-02 (×5): qty 0.15

## 2018-05-02 MED ORDER — METOPROLOL TARTRATE 25 MG/10 ML ORAL SUSPENSION: 12.5000 mg | Freq: Two times a day (BID) | ORAL | Status: DC

## 2018-05-02 MED ORDER — FUROSEMIDE 10 MG/ML IJ SOLN
40.0000 mg | Freq: Every day | INTRAMUSCULAR | Status: DC
  Administered 2018-05-02 – 2018-05-03 (×2): 40 mg via INTRAVENOUS
  Filled 2018-05-02 (×3): qty 4

## 2018-05-02 MED ORDER — MUSCLE RUB 10-15 % EX CREA
TOPICAL_CREAM | CUTANEOUS | Status: DC | PRN
  Administered 2018-05-02: 1 via TOPICAL
  Filled 2018-05-02: qty 85

## 2018-05-02 MED FILL — Potassium Chloride Inj 2 mEq/ML: INTRAVENOUS | Qty: 40 | Status: AC

## 2018-05-02 MED FILL — Magnesium Sulfate Inj 50%: INTRAMUSCULAR | Qty: 10 | Status: AC

## 2018-05-02 MED FILL — Heparin Sodium (Porcine) Inj 1000 Unit/ML: INTRAMUSCULAR | Qty: 30 | Status: AC

## 2018-05-02 NOTE — Plan of Care (Signed)
  Problem: Spiritual Needs Goal: Ability to function at adequate level Outcome: Progressing   Problem: Cardiac: Goal: Ability to achieve and maintain adequate cardiovascular perfusion will improve Outcome: Progressing   Problem: Health Behavior/Discharge Planning: Goal: Ability to manage health-related needs will improve Outcome: Progressing   Problem: Clinical Measurements: Goal: Ability to maintain clinical measurements within normal limits will improve Outcome: Progressing Goal: Will remain free from infection Outcome: Progressing Goal: Diagnostic test results will improve Outcome: Progressing Goal: Respiratory complications will improve Outcome: Progressing Goal: Cardiovascular complication will be avoided Outcome: Progressing   Problem: Nutrition: Goal: Adequate nutrition will be maintained Outcome: Progressing   Problem: Coping: Goal: Level of anxiety will decrease Outcome: Progressing   Problem: Pain Managment: Goal: General experience of comfort will improve Outcome: Progressing

## 2018-05-02 NOTE — Progress Notes (Signed)
3 Days Post-Op Procedure(s) (LRB): CORONARY ARTERY BYPASS GRAFTING (CABG) X 3; Using Left Internal Mammary Artery (LIMA) and Right Leg Greater Saphenous Vein Graft (SVG);  LIMA to LAD, SVG to OM1, SVG to RCA, (N/A) AORTIC VALVE REPLACEMENT (AVR) using INSPIRIS RESILIA  AORTIC VALVE 41mm (N/A) TRANSESOPHAGEAL ECHOCARDIOGRAM (TEE) (N/A) Subjective:  nausea better walked x2 nsr Objective: Vital signs in last 24 hours: Temp:  [97.6 F (36.4 C)-99.4 F (37.4 C)] 98.3 F (36.8 C) (01/13 0338) Pulse Rate:  [68-105] 97 (01/13 0700) Cardiac Rhythm: Normal sinus rhythm (01/13 0500) Resp:  [14-26] 20 (01/13 0700) BP: (111-147)/(49-87) 147/85 (01/13 0700) SpO2:  [90 %-96 %] 93 % (01/13 0700) Weight:  [106.5 kg] 106.5 kg (01/13 0500)  Hemodynamic parameters for last 24 hours:  stable  Intake/Output from previous day: 01/12 0701 - 01/13 0700 In: -  Out: 720 [Urine:720] Intake/Output this shift: No intake/output data recorded.       Exam    General- alert and comfortable    Neck- no JVD, no cervical adenopathy palpable, no carotid bruit   Lungs- clear without rales, wheezes   Cor- regular rate and rhythm, no murmur , gallop   Abdomen- soft, non-tender   Extremities - warm, non-tender, minimal edema   Neuro- oriented, appropriate, no focal weakness   Lab Results: Recent Labs    05/01/18 0248 05/02/18 0426  WBC 15.0* 11.9*  HGB 9.1* 9.0*  HCT 27.6* 26.6*  PLT 112* 119*   BMET:  Recent Labs    05/01/18 0248 05/02/18 0426  NA 138 138  K 4.5 4.0  CL 109 108  CO2 23 23  GLUCOSE 136* 157*  BUN 27* 34*  CREATININE 1.36* 1.38*  CALCIUM 8.0* 8.0*    PT/INR:  Recent Labs    04/29/18 1715  LABPROT 16.8*  INR 1.38   ABG    Component Value Date/Time   PHART 7.347 (L) 04/30/2018 0000   HCO3 21.4 04/30/2018 0000   TCO2 22 04/30/2018 1618   ACIDBASEDEF 4.0 (H) 04/30/2018 0000   O2SAT 95.0 04/30/2018 0000   CBG (last 3)  Recent Labs    05/01/18 2149 05/01/18 2341  05/02/18 0629  GLUCAP 100* 122* 99    Assessment/Plan: S/P Procedure(s) (LRB): CORONARY ARTERY BYPASS GRAFTING (CABG) X 3; Using Left Internal Mammary Artery (LIMA) and Right Leg Greater Saphenous Vein Graft (SVG);  LIMA to LAD, SVG to OM1, SVG to RCA, (N/A) AORTIC VALVE REPLACEMENT (AVR) using INSPIRIS RESILIA  AORTIC VALVE 38mm (N/A) TRANSESOPHAGEAL ECHOCARDIOGRAM (TEE) (N/A) Mobilize Diuresis Diabetes control Plan for transfer to step-down: see transfer orders   LOS: 6 days    Lawrence Hall 05/02/2018

## 2018-05-02 NOTE — Progress Notes (Signed)
Progress Note  Patient Name: Lawrence Hall Date of Encounter: 05/02/2018  Primary Cardiologist: Sanda Klein, MD   Subjective   Feeling a little bit better, decreased nausea from before  Inpatient Medications    Scheduled Meds: . acetaminophen  1,000 mg Oral Q6H   Or  . acetaminophen (TYLENOL) oral liquid 160 mg/5 mL  1,000 mg Per Tube Q6H  . aspirin EC  325 mg Oral Daily   Or  . aspirin  324 mg Per Tube Daily  . atorvastatin  80 mg Oral q1800  . bisacodyl  10 mg Oral Daily   Or  . bisacodyl  10 mg Rectal Daily  . docusate sodium  200 mg Oral Daily  . enoxaparin (LOVENOX) injection  40 mg Subcutaneous QHS  . furosemide  40 mg Intravenous Daily  . insulin aspart  0-15 Units Subcutaneous TID WC  . insulin detemir  15 Units Subcutaneous BID  . mouth rinse  15 mL Mouth Rinse BID  . metoCLOPramide (REGLAN) injection  10 mg Intravenous Q6H  . metoprolol tartrate  25 mg Oral BID   Or  . metoprolol tartrate  12.5 mg Per Tube BID  . pantoprazole  40 mg Oral Daily  . potassium chloride  20 mEq Oral Daily   Continuous Infusions:  PRN Meds: metoprolol tartrate, MUSCLE RUB, ondansetron (ZOFRAN) IV, oxyCODONE, sodium chloride flush, traMADol   Vital Signs    Vitals:   05/02/18 0400 05/02/18 0500 05/02/18 0600 05/02/18 0700  BP: 121/66 126/69 124/72 (!) 147/85  Pulse: (!) 101 100 99 97  Resp: (!) 22 (!) 22 18 20   Temp:      TempSrc:      SpO2: 96% 94% 93% 93%  Weight:  106.5 kg    Height:        Intake/Output Summary (Last 24 hours) at 05/02/2018 0811 Last data filed at 05/02/2018 0100 Gross per 24 hour  Intake -  Output 720 ml  Net -720 ml   Last 3 Weights 05/02/2018 05/01/2018 04/30/2018  Weight (lbs) 234 lb 12.6 oz 233 lb 14.5 oz 235 lb 0.2 oz  Weight (kg) 106.5 kg 106.1 kg 106.6 kg      Telemetry    No adverse arrhythmias, normal sinus rhythm, sinus tachycardia with interventricular conduction delay and PACs- Personally Reviewed  ECG    No new- Personally  Reviewed  Physical Exam   GEN: No acute distress.   Neck: No JVD Cardiac:  Tachycardic regular with occasional ectopy, no murmurs, rubs, or gallops.  CABG scar well-healing respiratory: Clear to auscultation bilaterally. GI: Soft, nontender, non-distended  MS: No edema; No deformity. Neuro:  Nonfocal  Psych: Normal affect   Labs    Chemistry Recent Labs  Lab 04/26/18 1212  04/28/18 0348  04/30/18 0521 04/30/18 1617 04/30/18 1618 05/01/18 0248 05/02/18 0426  NA 136   < >  --    < > 138  --  138 138 138  K 4.2   < >  --    < > 4.1  --  4.1 4.5 4.0  CL 105   < >  --    < > 111  --  105 109 108  CO2 21*   < >  --    < > 20*  --   --  23 23  GLUCOSE 117*   < >  --    < > 133*  --  169* 136* 157*  BUN 25*   < >  --    < >  18  --  23 27* 34*  CREATININE 1.20   < >  --    < > 1.19 1.39* 1.30* 1.36* 1.38*  CALCIUM 8.9   < >  --    < > 7.7*  --   --  8.0* 8.0*  PROT 7.0  --  6.8  --   --   --   --   --   --   ALBUMIN 3.9  --  3.6  --   --   --   --   --   --   AST 21  --  17  --   --   --   --   --   --   ALT 17  --  16  --   --   --   --   --   --   ALKPHOS 33*  --  33*  --   --   --   --   --   --   BILITOT 1.0  --  1.2  --   --   --   --   --   --   GFRNONAA 59*   < >  --    < > 60* 50*  --  51* 50*  GFRAA >60   < >  --    < > >60 57*  --  59* 58*  ANIONGAP 10   < >  --    < > 7  --   --  6 7   < > = values in this interval not displayed.     Hematology Recent Labs  Lab 04/30/18 1617 04/30/18 1618 05/01/18 0248 05/02/18 0426  WBC 12.5*  --  15.0* 11.9*  RBC 3.19*  --  3.14* 3.03*  HGB 9.1* 8.5* 9.1* 9.0*  HCT 27.7* 25.0* 27.6* 26.6*  MCV 86.8  --  87.9 87.8  MCH 28.5  --  29.0 29.7  MCHC 32.9  --  33.0 33.8  RDW 13.6  --  13.8 13.9  PLT PLATELET CLUMPS NOTED ON SMEAR, UNABLE TO ESTIMATE  --  112* 119*    Cardiac Enzymes Recent Labs  Lab 04/27/18 1054  TROPONINI <0.03    Recent Labs  Lab 04/26/18 1231  TROPIPOC 0.00     BNPNo results for input(s):  BNP, PROBNP in the last 168 hours.   DDimer No results for input(s): DDIMER in the last 168 hours.   Radiology    Dg Chest Port 1 View  Result Date: 05/02/2018 CLINICAL DATA:  Sore chest.  CABG. EXAM: PORTABLE CHEST 1 VIEW COMPARISON:  04/21/2018.  CT 04/27/2018. FINDINGS: Interim removal of left chest tube and right IJ sheath. Prior CABG. Cardiomegaly. New rounded density noted over the left upper lung. This could represent focal area of pneumonia. Atelectasis and or infarct could also present this fashion. Interim partial clearing of bibasilar infiltrates/edema. Small bilateral pleural effusions. No pneumothorax. Carotid vascular calcification. IMPRESSION: 1. Interim removal of left chest tube and right IJ sheath. No pneumothorax. 2. New rounded density noted over the left upper lung. This could represent a focal area of pneumonia. Atelectasis and or infarct could also present in this fashion. Interim partial clearing of bibasilar infiltrates/edema. Small bilateral pleural effusions again noted. 3.  Prior CABG.  Stable cardiomegaly. Electronically Signed   By: Marcello Moores  Register   On: 05/02/2018 07:00   Dg Chest Port 1 View  Result Date: 05/01/2018 CLINICAL DATA:  Patient status post  CABG procedure EXAM: PORTABLE CHEST 1 VIEW COMPARISON:  Chest radiograph 04/30/2018 FINDINGS: Interval retraction PA catheter. Right IJ sheath stable in position. Left chest tube remains in position. Stable cardiomegaly status post median sternotomy. Moderate layering bilateral effusions and underlying opacities. No pneumothorax. IMPRESSION: Moderate layering bilateral effusions with underlying opacities favored to represent atelectasis. Interval retraction PA catheter. Electronically Signed   By: Lovey Newcomer M.D.   On: 05/01/2018 06:27    Cardiac Studies   Cath 04/26/18  Prox LAD lesion is 50% stenosed.  Mid LM to Dist LM lesion is 70% stenosed.  Previously placed Prox Cx stent (unknown type) is widely  patent.  Prox RCA to Mid RCA lesion is 75% stenosed.   Multivessel CAD with smooth eccentric 70% distal left main stenosis; 50% proximal LAD stenosis; patent left circumflex stent; and 75% proximal RCA stenosis.  Normal right heart pressures.  Calcified aortic valve with markedly reduced excursion by aortography.  Moderate aortic stenosis with a peak instantaneous gradient of 36, peak to peak gradient of 31, and mean gradient of 26 mmHg with a valve area of 1.1 cm.  RECOMMENDATION: Surgical consultation for AVR/CABG revascularization.  Patient Profile     73 y.o. male with coronary artery disease status post CABG and aortic valve replacement with hyperlipidemia and prior non-STEMI in 2017, circumflex stent.  Assessment & Plan    Coronary artery disease -Status post CABG x3 LIMA to LAD SVG OM1 SVG RCA with aortic valve replacement -Doing well, normal sinus rhythm, medications reviewed.  Aortic stenosis status post 25 mm bioprosthetic aortic valve - Mean gradient 26 mmHg on cardiac catheterization, with moderate disease, replaced during bypass per guideline recommendations.  History of non-ST elevation myocardial infarction with DES placement to proximal left circumflex artery in 2017, patent on cardiac catheterization.  Hyperlipidemia - High intensity statin therapy, most recent LDL 69 in the outpatient setting.  Creatinine 1.38.  Continue to monitor.  Lasix.        For questions or updates, please contact [place] Please consult [email or web address] for contact info under        Signed, Candee Furbish, MD  05/02/2018, 8:11 AM

## 2018-05-02 NOTE — Progress Notes (Signed)
Patient ID: Lawrence Hall, male   DOB: 09-06-1945, 73 y.o.   MRN: [id number] [facility] Evening Rounds:  Hemodynamically stable in sinus rhythm.  sats 92% on RA  Diuresed some today.  Waiting on 4E bed.

## 2018-05-02 NOTE — Progress Notes (Signed)
   05/02/18 1300  Clinical Encounter Type  Visited With Family;Health care provider  Visit Type Follow-up;Psychological support  Consult/Referral To Nurse  Spiritual Encounters  Spiritual Needs Emotional  Stress Factors  Patient Stress Factors Not reviewed  Family Stress Factors Other (Comment) (concerned for her husband and his life changes)   F/u visit w/ Lawrence Hall when saw her in [facility] waiting rm.  She expressed concern to get her husband set up w/ an appt w/ therapy/counseling to help him develop tools/skills to adjust to being "not in control" over the next several months as he recovers from cardiac bypass surgery and generally adjusts to a new normal.  She expressed that her husband generally is upbeat when around ppl, but wife is concerned that the real internal struggle will come later, as well as concerns that he would ruminate.  This chaplain spoke w/ care RN to convey Lawrence Hall's concerns and care RN will pass on to ensure this lands with the correct person.  This was conveyed by me back to Lawrence Hall and she appeared relieved and was very Patent attorney.  Myra Gianotti resident, [phone]

## 2018-05-03 ENCOUNTER — Inpatient Hospital Stay (HOSPITAL_COMMUNITY): Payer: PPO

## 2018-05-03 LAB — GLUCOSE, CAPILLARY
GLUCOSE-CAPILLARY: 121 mg/dL — AB (ref 70–99)
Glucose-Capillary: 93 mg/dL (ref 70–99)
Glucose-Capillary: 85 mg/dL (ref 70–99)
Glucose-Capillary: 89 mg/dL (ref 70–99)
Glucose-Capillary: 95 mg/dL (ref 70–99)

## 2018-05-03 LAB — CBC
HCT: 25.7 % — ABNORMAL LOW (ref 39.0–52.0)
Hemoglobin: 8.5 g/dL — ABNORMAL LOW (ref 13.0–17.0)
MCH: 28.9 pg (ref 26.0–34.0)
MCHC: 33.1 g/dL (ref 30.0–36.0)
MCV: 87.4 fL (ref 80.0–100.0)
Platelets: 155 10*3/uL (ref 150–400)
RBC: 2.94 MIL/uL — ABNORMAL LOW (ref 4.22–5.81)
RDW: 13.8 % (ref 11.5–15.5)
WBC: 9.9 10*3/uL (ref 4.0–10.5)
nRBC: 0.2 % (ref 0.0–0.2)

## 2018-05-03 LAB — BASIC METABOLIC PANEL
Anion gap: 8 (ref 5–15)
BUN: 34 mg/dL — ABNORMAL HIGH (ref 8–23)
CO2: 24 mmol/L (ref 22–32)
Calcium: 7.9 mg/dL — ABNORMAL LOW (ref 8.9–10.3)
Chloride: 107 mmol/L (ref 98–111)
Creatinine, Ser: 1.22 mg/dL (ref 0.61–1.24)
GFR calc Af Amer: >60 mL/min (ref >60)
GFR calc non Af Amer: 58 mL/min — ABNORMAL LOW (ref >60)
Glucose, Bld: 76 mg/dL (ref 70–99)
Potassium: 3.7 mmol/L (ref 3.5–5.1)
Sodium: 139 mmol/L (ref 135–145)

## 2018-05-03 MED ORDER — AMIODARONE HCL 200 MG PO TABS
400.0000 mg | ORAL_TABLET | Freq: Two times a day (BID) | ORAL | Status: DC
  Administered 2018-05-03: 400 mg via ORAL
  Filled 2018-05-03: qty 2

## 2018-05-03 MED ORDER — SORBITOL 70 % PO SOLN
30.0000 mL | Freq: Every morning | ORAL | Status: AC
  Administered 2018-05-03: 30 mL via ORAL
  Filled 2018-05-03: qty 30

## 2018-05-03 MED ORDER — AMIODARONE HCL IN DEXTROSE 360-4.14 MG/200ML-% IV SOLN
INTRAVENOUS | Status: AC
  Administered 2018-05-03: 60 mg/h via INTRAVENOUS
  Filled 2018-05-03: qty 200

## 2018-05-03 MED ORDER — AMIODARONE HCL IN DEXTROSE 360-4.14 MG/200ML-% IV SOLN
60.0000 mg/h | INTRAVENOUS | Status: AC
  Administered 2018-05-03 (×2): 60 mg/h via INTRAVENOUS

## 2018-05-03 MED ORDER — METOPROLOL TARTRATE 50 MG PO TABS
50.0000 mg | ORAL_TABLET | Freq: Two times a day (BID) | ORAL | Status: DC
  Administered 2018-05-03 – 2018-05-07 (×8): 50 mg via ORAL
  Filled 2018-05-03 (×8): qty 1

## 2018-05-03 MED ORDER — METOPROLOL TARTRATE 25 MG/10 ML ORAL SUSPENSION
12.5000 mg | Freq: Two times a day (BID) | ORAL | Status: DC
  Filled 2018-05-03 (×6): qty 5

## 2018-05-03 MED ORDER — AMIODARONE HCL IN DEXTROSE 360-4.14 MG/200ML-% IV SOLN
30.0000 mg/h | INTRAVENOUS | Status: DC
  Filled 2018-05-03 (×2): qty 200

## 2018-05-03 NOTE — Progress Notes (Addendum)
[facility] DAILY ICU PROGRESS NOTE                   [address]            [place] [address]          [phone]   4 Days Post-Op Procedure(s) (LRB): CORONARY ARTERY BYPASS GRAFTING (CABG) X 3; Using Left Internal Mammary Artery (LIMA) and Right Leg Greater Saphenous Vein Graft (SVG);  LIMA to LAD, SVG to OM1, SVG to RCA, (N/A) AORTIC VALVE REPLACEMENT (AVR) using INSPIRIS RESILIA  AORTIC VALVE 39mm (N/A) TRANSESOPHAGEAL ECHOCARDIOGRAM (TEE) (N/A)  Total Length of Stay:  LOS: 7 days   Subjective: Feels okay this morning. States that his nausea is better today.   Objective: Vital signs in last 24 hours: Temp:  [97.6 F (36.4 C)-99.9 F (37.7 C)] 98.2 F (36.8 C) (01/14 0650) Pulse Rate:  [67-110] 89 (01/14 0600) Cardiac Rhythm: Normal sinus rhythm (01/14 0400) Resp:  [16-28] 20 (01/14 0600) BP: (101-136)/(56-101) 136/94 (01/14 0600) SpO2:  [87 %-99 %] 95 % (01/14 0600) Weight:  [105 kg] 105 kg (01/14 0500)  Filed Weights   05/01/18 0500 05/02/18 0500 05/03/18 0500  Weight: 106.1 kg 106.5 kg 105 kg    Weight change: -1.5 kg      Intake/Output from previous day: 01/13 0701 - 01/14 0700 In: 480 [P.O.:480] Out: 900 [Urine:900]  Intake/Output this shift: No intake/output data recorded.  Current Meds: Scheduled Meds: . acetaminophen  1,000 mg Oral Q6H   Or  . acetaminophen (TYLENOL) oral liquid 160 mg/5 mL  1,000 mg Per Tube Q6H  . aspirin EC  325 mg Oral Daily   Or  . aspirin  324 mg Per Tube Daily  . atorvastatin  80 mg Oral q1800  . bisacodyl  10 mg Oral Daily   Or  . bisacodyl  10 mg Rectal Daily  . docusate sodium  200 mg Oral Daily  . enoxaparin (LOVENOX) injection  40 mg Subcutaneous QHS  . furosemide  40 mg Intravenous Daily  . insulin aspart  0-15 Units Subcutaneous TID WC  . insulin detemir  15 Units Subcutaneous BID  . mouth rinse  15 mL Mouth Rinse BID  . metoCLOPramide (REGLAN) injection  10 mg Intravenous Q6H  . metoprolol  tartrate  25 mg Oral BID   Or  . metoprolol tartrate  12.5 mg Per Tube BID  . pantoprazole  40 mg Oral Daily  . potassium chloride  20 mEq Oral Daily   Continuous Infusions: PRN Meds:.metoprolol tartrate, MUSCLE RUB, ondansetron (ZOFRAN) IV, oxyCODONE, sodium chloride flush, traMADol  General appearance: alert, cooperative and no distress Heart: regular rate and rhythm, S1, S2 normal, no murmur, click, rub or gallop Lungs: clear to auscultation bilaterally Abdomen: soft, non-tender; bowel sounds normal; no masses,  no organomegaly Extremities: extremities normal, atraumatic, no cyanosis or edema Wound: clean and dry  Lab Results: CBC: Recent Labs    05/02/18 0426 05/03/18 0209  WBC 11.9* 9.9  HGB 9.0* 8.5*  HCT 26.6* 25.7*  PLT 119* 155   BMET:  Recent Labs    05/02/18 0426 05/03/18 0209  NA 138 139  K 4.0 3.7  CL 108 107  CO2 23 24  GLUCOSE 157* 76  BUN 34* 34*  CREATININE 1.38* 1.22  CALCIUM 8.0* 7.9*    CMET: Lab Results  Component Value Date   WBC 9.9 05/03/2018   HGB 8.5 (L) 05/03/2018   HCT 25.7 (L)  05/03/2018   PLT 155 05/03/2018   GLUCOSE 76 05/03/2018   CHOL 223 (H) 06/01/2015   TRIG 258 (H) 06/01/2015   HDL 45 06/01/2015   LDLCALC 126 (H) 06/01/2015   ALT 16 04/28/2018   AST 17 04/28/2018   NA 139 05/03/2018   K 3.7 05/03/2018   CL 107 05/03/2018   CREATININE 1.22 05/03/2018   BUN 34 (H) 05/03/2018   CO2 24 05/03/2018   TSH 5.037 (H) 04/28/2018   INR 1.38 04/29/2018   HGBA1C 5.4 04/28/2018      PT/INR: No results for input(s): LABPROT, INR in the last 72 hours. Radiology: No results found.   Assessment/Plan: S/P Procedure(s) (LRB): CORONARY ARTERY BYPASS GRAFTING (CABG) X 3; Using Left Internal Mammary Artery (LIMA) and Right Leg Greater Saphenous Vein Graft (SVG);  LIMA to LAD, SVG to OM1, SVG to RCA, (N/A) AORTIC VALVE REPLACEMENT (AVR) using INSPIRIS RESILIA  AORTIC VALVE 57mm (N/A) TRANSESOPHAGEAL ECHOCARDIOGRAM (TEE)  (N/A)  1. CV-rate-controlled atrial fibrillation- will add Amio 400mg  BID, BP well controlled, at times diastolic is high. Continue ASA, statin and Metoprolol.  2. Pulm-Tolerating room air with good oxygen saturation. Encouraged use of incentive spirometer. Small bilateral pleural effusions with atelectasis on CXR.  3. Renal-creatinine 1.22, electrolytes okay 4. H and H 8.5/25.7, expected acute blood loss anemia.  5. Endo-blood glucose well controlled on current regimen. 89/122/121 are the last CBGs.  6. Pain is well controlled.  Plan: Transfer orders placed yesterday. Nausea is better, enjoying breakfast this morning. Encouraged ambulation in the halls and use of incentive spirometer. Will remove EPW tomorrow as long as rhythm remains stable.    Elgie Collard 05/03/2018 7:28 AM  Patient now rate controlled AFib- start po,amiodarone, cont low dose lovenox for now and keep in ICU  patient examined and medical record reviewed,agree with above note. [name] 05/03/2018

## 2018-05-03 NOTE — Progress Notes (Signed)
Patient ID: Lawrence Hall, male   DOB: [birth year], 73 y.o.   MRN: [id number] EVENING ROUNDS NOTE :     [address]       [place] [address]             [phone]                 4 Days Post-Op Procedure(s) (LRB): CORONARY ARTERY BYPASS GRAFTING (CABG) X 3; Using Left Internal Mammary Artery (LIMA) and Right Leg Greater Saphenous Vein Graft (SVG);  LIMA to LAD, SVG to OM1, SVG to RCA, (N/A) AORTIC VALVE REPLACEMENT (AVR) using INSPIRIS RESILIA  AORTIC VALVE 83mm (N/A) TRANSESOPHAGEAL ECHOCARDIOGRAM (TEE) (N/A)  Total Length of Stay:  LOS: 7 days  BP (!) 117/98 (BP Location: Left Arm)   Pulse 95   Temp 98.5 F (36.9 C) (Oral)   Resp (!) 24   Ht 6' (1.829 m)   Wt 105 kg   SpO2 95%   BMI 31.39 kg/m   .Intake/Output      01/13 0701 - 01/14 0700 01/14 0701 - 01/15 0700   P.O. 480 480   Total Intake(mL/kg) 480 (4.6) 480 (4.6)   Urine (mL/kg/hr) 900 (0.4) 1100 (0.9)   Stool  0   Total Output 900 1100   Net -420 -620        Urine Occurrence 4 x 3 x   Stool Occurrence  1 x     . amiodarone 60 mg/hr (05/03/18 1735)  . amiodarone       Lab Results  Component Value Date   WBC 9.9 05/03/2018   HGB 8.5 (L) 05/03/2018   HCT 25.7 (L) 05/03/2018   PLT 155 05/03/2018   GLUCOSE 76 05/03/2018   CHOL 223 (H) 06/01/2015   TRIG 258 (H) 06/01/2015   HDL 45 06/01/2015   LDLCALC 126 (H) 06/01/2015   ALT 16 04/28/2018   AST 17 04/28/2018   NA 139 05/03/2018   K 3.7 05/03/2018   CL 107 05/03/2018   CREATININE 1.22 05/03/2018   BUN 34 (H) 05/03/2018   CO2 24 05/03/2018   TSH 5.037 (H) 04/28/2018   INR 1.38 04/29/2018   HGBA1C 5.4 04/28/2018   In a fib today , now on Cordarone, complaining of burning right arm  Likely from Cordarone, not infiltrated  Rate controlled   Grace Isaac MD  [place] Office [phone] 05/03/2018 6:10 PM

## 2018-05-04 DIAGNOSIS — I480 Paroxysmal atrial fibrillation: Secondary | ICD-10-CM

## 2018-05-04 DIAGNOSIS — R55 Syncope and collapse: Secondary | ICD-10-CM

## 2018-05-04 LAB — BASIC METABOLIC PANEL
Anion gap: 10 (ref 5–15)
BUN: 32 mg/dL — ABNORMAL HIGH (ref 8–23)
CO2: 25 mmol/L (ref 22–32)
Calcium: 8.1 mg/dL — ABNORMAL LOW (ref 8.9–10.3)
Chloride: 104 mmol/L (ref 98–111)
Creatinine, Ser: 1.15 mg/dL (ref 0.61–1.24)
GFR calc Af Amer: >60 mL/min (ref >60)
GFR calc non Af Amer: >60 mL/min (ref >60)
Glucose, Bld: 80 mg/dL (ref 70–99)
Potassium: 3.7 mmol/L (ref 3.5–5.1)
Sodium: 139 mmol/L (ref 135–145)

## 2018-05-04 LAB — CBC
HCT: 25.6 % — ABNORMAL LOW (ref 39.0–52.0)
Hemoglobin: 8.7 g/dL — ABNORMAL LOW (ref 13.0–17.0)
MCH: 29.7 pg (ref 26.0–34.0)
MCHC: 34 g/dL (ref 30.0–36.0)
MCV: 87.4 fL (ref 80.0–100.0)
Platelets: 188 10*3/uL (ref 150–400)
RBC: 2.93 MIL/uL — ABNORMAL LOW (ref 4.22–5.81)
RDW: 13.6 % (ref 11.5–15.5)
WBC: 7.7 10*3/uL (ref 4.0–10.5)
nRBC: 0.3 % — ABNORMAL HIGH (ref 0.0–0.2)

## 2018-05-04 LAB — GLUCOSE, CAPILLARY: Glucose-Capillary: 75 mg/dL (ref 70–99)

## 2018-05-04 MED ORDER — AMIODARONE HCL 200 MG PO TABS
400.0000 mg | ORAL_TABLET | Freq: Two times a day (BID) | ORAL | Status: DC
  Administered 2018-05-04 – 2018-05-07 (×7): 400 mg via ORAL
  Filled 2018-05-04 (×7): qty 2

## 2018-05-04 MED ORDER — FUROSEMIDE 40 MG PO TABS
40.0000 mg | ORAL_TABLET | Freq: Every day | ORAL | Status: DC
  Administered 2018-05-04 – 2018-05-07 (×4): 40 mg via ORAL
  Filled 2018-05-04 (×4): qty 1

## 2018-05-04 NOTE — Discharge Summary (Addendum)
Physician Discharge Summary        [address]       [place] [address]             [phone]       Patient ID: Lawrence Hall MRN: [id number] DOB/AGE: 75-Jan-1947 75 y.o.  Admit date: 04/26/2018 Discharge date: 05/07/2018  Admission Diagnoses: Patient Active Problem List   Diagnosis Date Noted  . CAD (coronary artery disease), native coronary artery 04/26/2018  . Near syncope   . CAD (coronary artery disease) 06/12/2015  . NSTEMI (non-ST elevated myocardial infarction) ([facility])   . Nonrheumatic aortic valve stenosis   . Non-STEMI (non-ST elevated myocardial infarction) ([facility])   . Essential hypertension 06/01/2015  . Asthma 05/31/2015  . Elevated troponin 05/31/2015  . Chest pain 07/25/2012  . Arthritis   . Hyperlipidemia   . Hypercholesterolemia   . Skin cancer   . ED (erectile dysfunction)   . Prostate cancer ([facility]) 03/27/2011    Discharge Diagnoses:  Active Problems:   Nonrheumatic aortic valve stenosis   Near syncope   CAD (coronary artery disease), native coronary artery   S/P CABG x 3   Discharged Condition: good  HPI:   Lawrence Hall is a 73 year old male patient with a past medical hisotry significant for prostate cancer, hyperlipidemia, hypercholesterolemia, basal cell carcinoma, asthma, arthritis, and know CAD (DES placed 05/2015 to the proximal left circumflex) who was sent to the ED after a failed stress test. During the test he developed near syncope due to severe hypotension and had marked ST segment depression in multiple lead and ST segment elevation in leave aVR. He had slowly progressing exertional dyspnea with decreasing exercise tolerance. Cardiac catheterization was performed on 1/7 and showed 50% stenosis of the proximal LAD, significant stenosis of the mid to distal left main, and 75% stenosis of the proximal to mid RCA.  His previously placed stent in the proximal circumflex was widely patent.  There was moderate aortic stenosis per right  heart cath.  Echocardiogram was recently performed but the report is still pending.  We are being consulted for possible revascularization and aortic valve repair/replacement.  Of note, he has had extensive dental work done in the past which involved extraction of most of his teeth and placement of permanent dentures. His most recent dental work was performed 3 months ago and he has not had any issues since.     Hospital Course:   Lawrence Hall is a 73 year old male patient who underwent a coronary bypass grafting x3 and aortic valve replacement on 04/29/2018 with Lawrence Hall.  He tolerated the procedure well and was transferred to the surgical ICU.  He was extubated in a timely manner.  Postop day 1 he had good hemodynamics and we are weaning milrinone and neo-as tolerated.  We discontinued the Swan-Ganz catheter after the milrinone was weaned.  We encourage incentive spirometry for some left lower lobe atelectasis.  We discontinued his mediastinal chest tube and left chest pleural chest tubes in place for now.  Postop day 2 we continue to progress.  He had some PACs on telemetry but remained in normal sinus rhythm.  He was fluid overloaded therefore we initiated p.o. Lasix.  We continued Lovenox and SCDs for DVT prophylaxis.  Postop day 3 he had some nausea which improved with medication.  He was walking in the halls.  We worked on mobilization and diuresis for fluid overload.  He was stable to transfer to the  telemetry floor for continued care.  Postop day 4 he went into rate controlled atrial fibrillation.  We initially tried to treat with amiodarone 400 mg twice daily and we increased his metoprolol to 50 mg twice daily.  However, he remained uncontrolled and his rate continued to climb, therefore we initiated the Amio gtt protocol.  He had some small bilateral pleural effusions on chest x-ray and we continued diuresis.  His nausea was improving.  We kept him in the ICU due to the atrial fibrillation.   Postop day 5 we were able to transition him to oral amiodarone.  He was in normal sinus rhythm in the 80s with PVCs.  His blood glucose remained controlled and his A1c is 5.4.  We discontinued his Levemir since he does not have a history of diabetes mellitus but continued his sliding scale insulin.  We changed his IV Lasix to oral tabs.  We plan to transfer him to the telemetry unit for continued care.  The patient continued to progress well.  His incisions are noted to be healing well without evidence of infection.  Oxygen has been weaned and he maintains good saturations on room air.  He does have ongoing rate controlled atrial flutter and has been started on apixaban.  He does have some low level of volume overload and will be discharged with a week of Lasix and potassium.  He is ambulating well in the hallways and tolerating cardiac rehab.  He does have an expected acute blood loss anemia which is stable.  At the time of discharge the patient is felt to be quite stable.   Consults: None  Significant Diagnostic Studies:   CLINICAL DATA:  Productive cough. Status post aortic valve replacement and CABG on 04/29/2018. Pleural effusions.  EXAM: CHEST - 2 VIEW  COMPARISON:  Chest x-ray dated 05/02/2018  FINDINGS: Persistent cardiomegaly with small persistent bilateral pleural effusions. Slight pulmonary vascular prominence. No pneumothorax.  There is a rounded soft tissue density in the superior anterior aspect of the left hemithorax which I suspect represents loculated fluid.  Minimal bibasilar atelectasis, improved.  IMPRESSION: 1. Improved bibasilar atelectasis. 2. Persistent small bilateral pleural effusions. 3. Persistent cardiomegaly with mild pulmonary vascular congestion. 4. Probable loculated fluid superiorly and anteriorly in the left hemithorax.   Electronically Signed   By: Lorriane Shire M.D.   On: 05/03/2018 08:39  Treatments:   NAME: [name], [name] MEDICAL  RECORD NI:[id number] ACCOUNT [id number] DATE OF BIRTH:1946/01/07 FACILITY: [facility] LOCATION: [facility] PHYSICIAN: VAN TRIGT III, MD  OPERATIVE REPORT  DATE OF PROCEDURE:  04/29/2018  OPERATION: 1.  Aortic valve replacement for moderate aortic stenosis with a 25 mm Edwards pericardial valve (Inspiris serial [id number]). 2.  Coronary artery bypass grafting x3 (left internal mammary artery to left anterior descending, saphenous vein graft to obtuse marginal, saphenous vein graft to right coronary artery). 3.  Endoscopic harvest of right leg greater saphenous vein.  SURGEON:  Len Childs, MD  ASSISTANT:  Nicholes Rough, PA-C   ANESTHESIA:  General.  PREOPERATIVE DIAGNOSES:  Severe 3-vessel coronary artery disease, moderate aortic stenosis, positive stress test with class 3 exertional angina.  POSTOPERATIVE DIAGNOSES:  Severe 3-vessel coronary artery disease, moderate aortic stenosis, positive stress test with class 3 exertional angina.  Discharge Exam: Blood pressure 120/74, pulse (!) 110, temperature 99.3 F (37.4 C), temperature source Oral, resp. rate (!) 24, height 6' (1.829 m), weight 100.1 kg, SpO2 96 %.  General appearance: alert, cooperative and no distress Heart: irregularly  irregular rhythm Lungs: mildly dim in bases Abdomen: benign Extremities: trace edema Wound: incis healing well   Disposition: Discharge disposition: 01-Home or Self Care       Discharge Instructions    Amb Referral to Cardiac Rehabilitation   Complete by:  As directed    Diagnosis:   CABG Valve Replacement     Valve:  Aortic   CABG X ___:  3   Discharge patient   Complete by:  As directed    Discharge disposition:  01-Home or Self Care   Discharge patient date:  05/07/2018     Allergies as of 05/07/2018      Reactions   Other Itching, Other (See Comments)   Pet dander, seasonal allergies = Runny nose, itchy eyes      Medication List    STOP taking these medications     benzonatate 100 MG capsule Commonly known as:  TESSALON   Icosapent Ethyl 1 g Caps Commonly known as:  VASCEPA   metoprolol succinate 25 MG 24 hr tablet Commonly known as:  TOPROL-XL   metoprolol succinate 50 MG 24 hr tablet Commonly known as:  TOPROL-XL   nitroGLYCERIN 0.4 MG SL tablet Commonly known as:  NITROSTAT   olmesartan 20 MG tablet Commonly known as:  BENICAR   rosuvastatin 20 MG tablet Commonly known as:  CRESTOR   rosuvastatin 5 MG tablet Commonly known as:  CRESTOR     TAKE these medications   amiodarone 200 MG tablet Commonly known as:  PACERONE Take 1 tablet (200 mg total) by mouth 2 (two) times daily. For 14 days, then 200 mg once daily   apixaban 5 MG Tabs tablet Commonly known as:  ELIQUIS Take 1 tablet (5 mg total) by mouth 2 (two) times daily.   aspirin 325 MG EC tablet Take 1 tablet (325 mg total) by mouth daily. What changed:    medication strength  how much to take   atorvastatin 80 MG tablet Commonly known as:  LIPITOR Take 1 tablet (80 mg total) by mouth daily at 6 PM.   CoQ10 200 MG Caps Take 200 mg by mouth daily.   furosemide 40 MG tablet Commonly known as:  LASIX Take 1 tablet (40 mg total) by mouth daily.   irbesartan 75 MG tablet Commonly known as:  AVAPRO Take 1 tablet (75 mg total) by mouth daily.   MAGNESIUM-ZINC PO Take 1 tablet by mouth daily.   MEGARED OMEGA-3 KRILL OIL 500 MG Caps Take 1 capsule by mouth daily.   metoprolol tartrate 50 MG tablet Commonly known as:  LOPRESSOR Take 1 tablet (50 mg total) by mouth 2 (two) times daily.   oxyCODONE 5 MG immediate release tablet Commonly known as:  Oxy IR/ROXICODONE Take 1-2 tablets (5-10 mg total) by mouth every 6 (six) hours as needed for up to 7 days for severe pain.   potassium chloride SA 20 MEQ tablet Commonly known as:  K-DUR,KLOR-CON Take 1 tablet (20 mEq total) by mouth daily.   Vitamin D-3 125 MCG (5000 UT) Tabs Take 5,000 Units by mouth daily.    vitamin E 400 UNIT capsule Take 400 Units by mouth daily.      Follow-up Information    Fanny Bien, MD. Call in 1 day(s).   Specialty:  Family Medicine Contact information: [address] [place] [address] [phone]        Sanda Klein, MD .   Specialty:  Cardiology Contact information: [address]  [place] [address] [phone]        Almyra Deforest, [place] Follow up.   Specialties:  Cardiology, Radiology Why:  Almyra Deforest, PA-C 1/31 @ 9am ([facility])  Contact information: [address] [place] [address] [phone]        [facility] [place] Follow up.   Specialty:  Cardiothoracic Surgery Why:  Your routine follow-up appointment is on 05/30/2018 at 1:00pm. Please arrive at 12:30pm for a chest xray located at [facility] which is on the first floor of our building.  Contact information: [place], [place] [phone]         The patient has been discharged on:   1.Beta Blocker:  Yes [  y ]                              No   [   ]                              If No, reason:  2.Ace Inhibitor/ARB: Yes [  y ]                                     No  [    ]                                     If No, reason:  3.Statin:   Yes [  y ]                  No  [   ]                  If No, reason:  4.Ecasa:  Yes  [ y  ]                  No   [   ]                  If No, reason:  Signed: Colbin Giovanni 05/07/2018, 7:42 AM   patient examined and medical record reviewed,agree with above note. [name] 05/09/2018

## 2018-05-04 NOTE — Progress Notes (Signed)
Patient arrived on the unit from [facility] on a wheelchair, placed on tele ccmd notified,vital signs are stable, patient oriented to room and staff. Bed in lowest position, call bell within reach will monitor.

## 2018-05-04 NOTE — Progress Notes (Signed)
Progress Note  Patient Name: Lawrence Hall Date of Encounter: 05/04/2018  Primary Cardiologist: Sanda Klein, MD   Subjective   Atrial fibrillation noted yesterday.  Amiodarone drip.  Burning in his forearm from this.  Transitioning to p.o.  No chest pain, no significant shortness of breath.  Inpatient Medications    Scheduled Meds: . acetaminophen  1,000 mg Oral Q6H   Or  . acetaminophen (TYLENOL) oral liquid 160 mg/5 mL  1,000 mg Per Tube Q6H  . amiodarone  400 mg Oral BID  . aspirin EC  325 mg Oral Daily   Or  . aspirin  324 mg Per Tube Daily  . atorvastatin  80 mg Oral q1800  . bisacodyl  10 mg Oral Daily   Or  . bisacodyl  10 mg Rectal Daily  . docusate sodium  200 mg Oral Daily  . enoxaparin (LOVENOX) injection  40 mg Subcutaneous QHS  . furosemide  40 mg Oral Daily  . mouth rinse  15 mL Mouth Rinse BID  . metoCLOPramide (REGLAN) injection  10 mg Intravenous Q6H  . metoprolol tartrate  50 mg Oral BID   Or  . metoprolol tartrate  12.5 mg Per Tube BID  . pantoprazole  40 mg Oral Daily  . potassium chloride  20 mEq Oral Daily  . sorbitol  30 mL Oral q morning - 10a   Continuous Infusions:  PRN Meds: metoprolol tartrate, MUSCLE RUB, ondansetron (ZOFRAN) IV, oxyCODONE, sodium chloride flush, traMADol   Vital Signs    Vitals:   05/04/18 0700 05/04/18 0738 05/04/18 0746 05/04/18 1106  BP:   132/73   Pulse: 84  87   Resp: (!) 21     Temp:  98.2 F (36.8 C)  98 F (36.7 C)  TempSrc:  Oral  Oral  SpO2: 97%  98%   Weight:      Height:        Intake/Output Summary (Last 24 hours) at 05/04/2018 1323 Last data filed at 05/04/2018 1100 Gross per 24 hour  Intake 342.26 ml  Output -  Net 342.26 ml   Last 3 Weights 05/04/2018 05/03/2018 05/02/2018  Weight (lbs) 226 lb 13.7 oz 231 lb 7.7 oz 234 lb 12.6 oz  Weight (kg) 102.9 kg 105 kg 106.5 kg      Telemetry    Normal sinus rhythm interventricular conduction delay 80, interventricular conduction delay noted.-  Personally Reviewed  ECG    Atrial fibrillation 100- Personally Reviewed  Physical Exam   GEN: No acute distress.   Neck: No JVD Cardiac: Regular rhythm, occasional ectopy, no murmurs, rubs, or gallops.  Respiratory: Clear to auscultation bilaterally. GI: Soft, nontender, non-distended  MS: No edema; No deformity. Neuro:  Nonfocal  Psych: Normal affect   Labs    Chemistry Recent Labs  Lab 04/28/18 0348  05/02/18 0426 05/03/18 0209 05/04/18 0203  NA  --    < > 138 139 139  K  --    < > 4.0 3.7 3.7  CL  --    < > 108 107 104  CO2  --    < > 23 24 25   GLUCOSE  --    < > 157* 76 80  BUN  --    < > 34* 34* 32*  CREATININE  --    < > 1.38* 1.22 1.15  CALCIUM  --    < > 8.0* 7.9* 8.1*  PROT 6.8  --   --   --   --  ALBUMIN 3.6  --   --   --   --   AST 17  --   --   --   --   ALT 16  --   --   --   --   ALKPHOS 33*  --   --   --   --   BILITOT 1.2  --   --   --   --   GFRNONAA  --    < > 50* 58* >60  GFRAA  --    < > 58* >60 >60  ANIONGAP  --    < > 7 8 10    < > = values in this interval not displayed.     Hematology Recent Labs  Lab 05/02/18 0426 05/03/18 0209 05/04/18 0203  WBC 11.9* 9.9 7.7  RBC 3.03* 2.94* 2.93*  HGB 9.0* 8.5* 8.7*  HCT 26.6* 25.7* 25.6*  MCV 87.8 87.4 87.4  MCH 29.7 28.9 29.7  MCHC 33.8 33.1 34.0  RDW 13.9 13.8 13.6  PLT 119* 155 188    Cardiac EnzymesNo results for input(s): TROPONINI in the last 168 hours. No results for input(s): TROPIPOC in the last 168 hours.   BNPNo results for input(s): BNP, PROBNP in the last 168 hours.   DDimer No results for input(s): DDIMER in the last 168 hours.   Radiology    Dg Chest 2 View  Result Date: 05/03/2018 CLINICAL DATA:  Productive cough. Status post aortic valve replacement and CABG on 04/29/2018. Pleural effusions. EXAM: CHEST - 2 VIEW COMPARISON:  Chest x-ray dated 05/02/2018 FINDINGS: Persistent cardiomegaly with small persistent bilateral pleural effusions. Slight pulmonary vascular  prominence. No pneumothorax. There is a rounded soft tissue density in the superior anterior aspect of the left hemithorax which I suspect represents loculated fluid. Minimal bibasilar atelectasis, improved. IMPRESSION: 1. Improved bibasilar atelectasis. 2. Persistent small bilateral pleural effusions. 3. Persistent cardiomegaly with mild pulmonary vascular congestion. 4. Probable loculated fluid superiorly and anteriorly in the left hemithorax. Electronically Signed   By: Lorriane Shire M.D.   On: 05/03/2018 08:39    Cardiac Studies   Cardiac catheterization with multivessel coronary disease with moderate aortic stenosis  Patient Profile     73 y.o. male with CAD post CABG aortic valve replacement hyperlipidemia prior non-STEMI 2017 with circumflex stent placement now with postoperative atrial fibrillation.  Interventricular conduction delay noted.  Assessment & Plan    Paroxysmal atrial fibrillation - Agree with amiodarone, transitioning to oral especially given his burning in arm.  He has converted back to sinus rhythm.  Excellent.  We will continue with amiodarone likely for the next few weeks.  Coronary artery disease -Status post CABG x3 LIMA to LAD SVG to obtuse marginal 1 and SVG to RCA with aortic valve replacement.  Doing well.  Aortic valve replacement -25 mm bioprosthetic aortic valve.  Previously moderate aortic stenosis.  Prior history of non-ST elevation myocardial infarction -DES placement to proximal left circumflex in 2017.  Patent on recent cardiac catheterization.  Hyperlipidemia -High intensity statin therapy.  LDL 69 in the outpatient setting.    Vasovagal syncope -Had a syncopal episode after performing PFTs and blowing vigorously.  For questions or updates, please contact [place] Please consult [email or web address] for contact info under        Signed, Candee Furbish, MD  05/04/2018, 1:23 PM

## 2018-05-04 NOTE — Op Note (Signed)
NAME: Lawrence Hall, LIMAS MEDICAL RECORD WV:[id number] ACCOUNT [id number] DATE OF BIRTH:Nov 11, 1945 FACILITY: [facility] LOCATION: [facility] PHYSICIAN:Delan Ksiazek VAN TRIGT III, MD  OPERATIVE REPORT  DATE OF PROCEDURE:  04/29/2018  OPERATION: 1.  Aortic valve replacement for moderate aortic stenosis with a 25 mm Edwards pericardial valve (Inspiris serial [id number]). 2.  Coronary artery bypass grafting x3 (left internal mammary artery to left anterior descending, saphenous vein graft to obtuse marginal, saphenous vein graft to right coronary artery). 3.  Endoscopic harvest of right leg greater saphenous vein.  SURGEON:  Len Childs, MD  ASSISTANT:  Nicholes Rough, PA-C   ANESTHESIA:  General.  PREOPERATIVE DIAGNOSES:  Severe 3-vessel coronary artery disease, moderate aortic stenosis, positive stress test with class 3 exertional angina.  POSTOPERATIVE DIAGNOSES:  Severe 3-vessel coronary artery disease, moderate aortic stenosis, positive stress test with class 3 exertional angina.  CLINICAL NOTE:  The patient is a 73 year old obese male with history of treated prostate cancer, dyslipidemia, asthma, and previous percutaneous coronary intervention of the proximal circumflex.  He had exertional symptoms of chest tightness and  shortness of breath and had a stress test which was strongly positive, and he was sent to the ED for admission.  His enzymes were negative.  Cardiac catheterization demonstrated severe left main stenosis with proximal RCA stenosis as well.  Cardiac echo  demonstrated moderate aortic stenosis with a peak gradient of 50 mmHg.  A trileaflet aortic valve, heavily calcified, was noted.  The patient was felt to be a candidate for combined AVR and CABG.  After reviewing the patient's cardiac cath and  echocardiogram images, I discussed the procedure of CABG and AVR for treatment of his CAD and aortic stenosis.  I discussed the major details of the operation including the use of general  anesthesia and cardiopulmonary bypass, the location of the  surgical incisions, and the expected postoperative hospital recovery.  I discussed with the patient the benefits of the surgery to include preservation of LV function and improved survival and improved symptoms.  I discussed the risks of the surgery with  the patient including the risk of stroke, bleeding, blood transfusion, postoperative infection, postoperative pulmonary problems, postoperative arrhythmia and heart block, postoperative organ failure, and death.  After reviewing these issues, he  demonstrated his understanding and agreed to proceed with surgery under what I felt was informed consent.  OPERATIVE FINDINGS: 1.  Adequate conduit. 2.  No blood products required. 3.  Adequate target vessels. 4.  Successful replacement of a stenotic calcified aortic valve with a 25 mm Edwards pericardial valve.  DESCRIPTION OF PROCEDURE:  The patient was brought from the preop holding where informed consent was documented and final questions were addressed.  The patient was placed supine on the operating room table and general anesthesia was induced.  The  patient remained stable.  A transesophageal echo probe was placed by the anesthesia team, and this confirmed the preoperative diagnosis of moderate aortic stenosis, fairly well preserved LV function.  The patient was prepped and draped as a sterile  field.  A proper time-out was performed.  A sternal incision was made as the saphenous vein was harvested endoscopically from the right leg.  The left internal mammary artery was harvested as a pedicle graft from its origin at the subclavian vessels.   The sternal retractor was placed using the deep blades because of the patient's obese body habitus.  The pericardium was opened and suspended, and pursestrings were placed in the ascending aorta and right atrium.  Heparin was administered, and the ACT  was therapeutic.  The patient was cannulated and  placed on bypass.  An LV vent was placed via the right superior pulmonary vein.  The coronaries were identified for grafting and were adequate targets.  The mammary artery and vein grafts were prepared for  the distal anastomoses.  Cardioplegic cannulas were placed both antegrade and retrograde cold blood cardioplegia, and the patient was cooled to 32 degrees.  The aortic crossclamp was applied, and a liter of cold blood cardioplegia was delivered in split  doses between the antegrade aortic and retrograde coronary sinus catheters.  There was good cardioplegic arrest, and supple temperature dropped less than 12 degrees.  Cardioplegia was delivered in 20 minutes.  The distal coronary anastomoses were performed.  The first distal anastomosis was to the distal RCA.  This had a proximal high-grade stenosis.  A reverse saphenous vein was sewn end-to-side with running 7-0 Prolene with good flow through the graft.   Cardioplegia was redosed.  A second distal anastomosis was the OM branch of the circumflex.  This was a 1.5 mm vessel, proximal left main stenosis.  A reverse saphenous vein was sewn end-to-side with running 7-0 Prolene with good flow through the graft.  Cardioplegia was redosed.   The third distal anastomosis was the distal third of the LAD.  This was a 1.5 mm vessel.  The left IMA was brought through an opening in the left lateral pericardium and was brought down onto the LAD and sewn end-to-side with running 8-0 Prolene.  There  was good flow through the anastomosis after briefly releasing the pedicle bulldog and the mammary artery.  The bulldog was reapplied, and the pedicle was sutured to the epicardium.  The proximal native LAD had a high-grade stenosis from the left main  and proximal RCA disease.  After cardioplegia was redosed, attention was directed to the aortic valve.  Transverse aortotomy was performed.  The aortic valve was inspected.  It was heavily calcified and had 3 leaflets.  The  leaflets were excised, and the annulus was debrided of  calcium.  The outflow tract was irrigated with copious amounts of sterile saline.  The annulus was sized to a 25 mm pericardial Edwards valve.  Subannular 2-0 Ethibond pledgeted sutures were placed around the annulus.  The valve was brought to the field  and sutures were placed through the sewing ring.  The valve was seated and the sutures were tied.  There was good confirmation of the aortic valve annulus to the native cardiac annulus.  There was no obstruction of the coronary ostium and no evidence of  space for perivalvular leak.  The aortotomy was then closed in 2 layers using running 4-0 Prolene.  While the cross-clamp was still in place, 2 proximal vein anastomoses were performed with a 4.5 mm punch and running 6-0 Prolene.  Prior to tying down the final proximal anastomosis, air was vented from the coronaries with a dose of retrograde warm blood  cardioplegia--Hot Shot.  The cross-clamp was removed.  The heart resumed a spontaneous rhythm.  The vein grafts were deaired and opened, and each had good flow.  Hemostasis was documented at the proximal and distal sites.  The patient was rewarmed and reperfused.  Temporary pacing wires were applied.  The  lungs were expanded.  Ventilator was resumed.  The patient was placed on low-dose milrinone.  He then weaned off cardiopulmonary bypass without difficulty.  Echo showed normal LV function.  Echo  showed normal function of the aortic valve prosthesis.  Protamine was administered without adverse reaction.  The cannulas were removed, and the mediastinum was irrigated.  There was adequate hemostasis.  The superior pericardial fat was closed over the aorta.  The anterior mediastinum and left pleural chest  tubes were placed and brought out through separate incisions.  The sternum was closed with wire.  The patient remained stable.  The pectoralis fascia was closed with a running #1 Vicryl.  Subcutaneous  and skin layers were closed with a running Vicryl.   Sterile dressings were placed and the chest tube connected to a Pleur-Evac drainage canister.  The patient was then transported back to the ICU on the ventilator in stable condition.  Total cardiopulmonary bypass time was 149 minutes.  LN/NUANCE  D:05/02/2018 T:05/02/2018 JOB:[id number]

## 2018-05-04 NOTE — Progress Notes (Addendum)
[facility] DAILY ICU PROGRESS NOTE                   [address]            [place] [address]          [phone]   5 Days Post-Op Procedure(s) (LRB): CORONARY ARTERY BYPASS GRAFTING (CABG) X 3; Using Left Internal Mammary Artery (LIMA) and Right Leg Greater Saphenous Vein Graft (SVG);  LIMA to LAD, SVG to OM1, SVG to RCA, (N/A) AORTIC VALVE REPLACEMENT (AVR) using INSPIRIS RESILIA  AORTIC VALVE 83mm (N/A) TRANSESOPHAGEAL ECHOCARDIOGRAM (TEE) (N/A)  Total Length of Stay:  LOS: 8 days   Subjective: Feels okay this morning.   Objective: Vital signs in last 24 hours: Temp:  [98.2 F (36.8 C)-98.5 F (36.9 C)] 98.2 F (36.8 C) (01/15 0738) Pulse Rate:  [43-136] 87 (01/15 0746) Cardiac Rhythm: Atrial fibrillation (01/15 0424) Resp:  [14-25] 21 (01/15 0700) BP: (104-138)/(66-98) 132/73 (01/15 0746) SpO2:  [91 %-99 %] 97 % (01/15 0700) Weight:  [102.9 kg] 102.9 kg (01/15 0500)  Filed Weights   05/02/18 0500 05/03/18 0500 05/04/18 0500  Weight: 106.5 kg 105 kg 102.9 kg    Weight change: -2.1 kg       Intake/Output from previous day: 01/14 0701 - 01/15 0700 In: 724.7 [P.O.:480; I.V.:244.7] Out: 1100 [Urine:1100]  Intake/Output this shift: No intake/output data recorded.  Current Meds: Scheduled Meds: . acetaminophen  1,000 mg Oral Q6H   Or  . acetaminophen (TYLENOL) oral liquid 160 mg/5 mL  1,000 mg Per Tube Q6H  . aspirin EC  325 mg Oral Daily   Or  . aspirin  324 mg Per Tube Daily  . atorvastatin  80 mg Oral q1800  . bisacodyl  10 mg Oral Daily   Or  . bisacodyl  10 mg Rectal Daily  . docusate sodium  200 mg Oral Daily  . enoxaparin (LOVENOX) injection  40 mg Subcutaneous QHS  . furosemide  40 mg Intravenous Daily  . insulin aspart  0-15 Units Subcutaneous TID WC  . insulin detemir  15 Units Subcutaneous BID  . mouth rinse  15 mL Mouth Rinse BID  . metoCLOPramide (REGLAN) injection  10 mg Intravenous Q6H  . metoprolol tartrate  50 mg  Oral BID   Or  . metoprolol tartrate  12.5 mg Per Tube BID  . pantoprazole  40 mg Oral Daily  . potassium chloride  20 mEq Oral Daily  . sorbitol  30 mL Oral q morning - 10a   Continuous Infusions: . amiodarone 30 mg/hr (05/04/18 0700)   PRN Meds:.metoprolol tartrate, MUSCLE RUB, ondansetron (ZOFRAN) IV, oxyCODONE, sodium chloride flush, traMADol  General appearance: alert, cooperative and no distress Heart: regular rate and rhythm, S1, S2 normal, no murmur, click, rub or gallop Lungs: clear to auscultation bilaterally Abdomen: soft, non-tender; bowel sounds normal; no masses,  no organomegaly Extremities: extremities normal, atraumatic, no cyanosis or edema Wound: clean and dry  Lab Results: CBC: Recent Labs    05/03/18 0209 05/04/18 0203  WBC 9.9 7.7  HGB 8.5* 8.7*  HCT 25.7* 25.6*  PLT 155 188   BMET:  Recent Labs    05/03/18 0209 05/04/18 0203  NA 139 139  K 3.7 3.7  CL 107 104  CO2 24 25  GLUCOSE 76 80  BUN 34* 32*  CREATININE 1.22 1.15  CALCIUM 7.9* 8.1*    CMET: Lab Results  Component Value Date  WBC 7.7 05/04/2018   HGB 8.7 (L) 05/04/2018   HCT 25.6 (L) 05/04/2018   PLT 188 05/04/2018   GLUCOSE 80 05/04/2018   CHOL 223 (H) 06/01/2015   TRIG 258 (H) 06/01/2015   HDL 45 06/01/2015   LDLCALC 126 (H) 06/01/2015   ALT 16 04/28/2018   AST 17 04/28/2018   NA 139 05/04/2018   K 3.7 05/04/2018   CL 104 05/04/2018   CREATININE 1.15 05/04/2018   BUN 32 (H) 05/04/2018   CO2 25 05/04/2018   TSH 5.037 (H) 04/28/2018   INR 1.38 04/29/2018   HGBA1C 5.4 04/28/2018      PT/INR: No results for input(s): LABPROT, INR in the last 72 hours. Radiology: No results found.   Assessment/Plan: S/P Procedure(s) (LRB): CORONARY ARTERY BYPASS GRAFTING (CABG) X 3; Using Left Internal Mammary Artery (LIMA) and Right Leg Greater Saphenous Vein Graft (SVG);  LIMA to LAD, SVG to OM1, SVG to RCA, (N/A) AORTIC VALVE REPLACEMENT (AVR) using INSPIRIS RESILIA  AORTIC  VALVE 65mm (N/A) TRANSESOPHAGEAL ECHOCARDIOGRAM (TEE) (N/A)  1. CV-NSR in the 80s with PVCs. Tolerating Amio gtt per protocol, BP well controlled. Continue ASA, statin and Metoprolol.  2. Pulm-Tolerating room air with good oxygen saturation. Encouraged use of incentive spirometer. Small bilateral pleural effusions with atelectasis on CXR.  3. Renal-creatinine 1.15, electrolytes okay 4. H and H 8.7/25.6, expected acute blood loss anemia.  5. Endo-blood glucose well controlled on current regimen. 89/95/75 are the last CBGs.  6. Pain is well controlled.  Plan: Transition to oral Amio. Transfer to 4E later today. Continue to encourage incentive spirometer. Will change Lasix IV to oral. Possible discharge in 1-2 days.      Lawrence Hall 05/04/2018 8:28 AM   Patient now in sinus rhythm with PACs He looks good and is ready for transfer to progressive care unit Probably discharge home in 48 hours on oral amiodarone  patient examined and medical record reviewed,agree with above note. [name] 05/04/2018

## 2018-05-04 NOTE — Progress Notes (Signed)
Report given to [facility], RN on 4E at this time.

## 2018-05-05 LAB — CBC
HCT: 28.3 % — ABNORMAL LOW (ref 39.0–52.0)
Hemoglobin: 9.6 g/dL — ABNORMAL LOW (ref 13.0–17.0)
MCH: 29.4 pg (ref 26.0–34.0)
MCHC: 33.9 g/dL (ref 30.0–36.0)
MCV: 86.5 fL (ref 80.0–100.0)
Platelets: 223 10*3/uL (ref 150–400)
RBC: 3.27 MIL/uL — ABNORMAL LOW (ref 4.22–5.81)
RDW: 13.6 % (ref 11.5–15.5)
WBC: 7.2 10*3/uL (ref 4.0–10.5)
nRBC: 0.3 % — ABNORMAL HIGH (ref 0.0–0.2)

## 2018-05-05 LAB — BASIC METABOLIC PANEL
Anion gap: 11 (ref 5–15)
BUN: 28 mg/dL — ABNORMAL HIGH (ref 8–23)
CO2: 22 mmol/L (ref 22–32)
Calcium: 8.3 mg/dL — ABNORMAL LOW (ref 8.9–10.3)
Chloride: 107 mmol/L (ref 98–111)
Creatinine, Ser: 1.18 mg/dL (ref 0.61–1.24)
GFR calc Af Amer: >60 mL/min (ref >60)
GFR calc non Af Amer: >60 mL/min (ref >60)
Glucose, Bld: 94 mg/dL (ref 70–99)
Potassium: 4.2 mmol/L (ref 3.5–5.1)
Sodium: 140 mmol/L (ref 135–145)

## 2018-05-05 MED ORDER — IRBESARTAN 150 MG PO TABS
75.0000 mg | ORAL_TABLET | Freq: Every day | ORAL | Status: DC
  Administered 2018-05-05 – 2018-05-07 (×3): 75 mg via ORAL
  Filled 2018-05-05 (×2): qty 1

## 2018-05-05 NOTE — Plan of Care (Signed)
  Problem: Health Behavior/Discharge Planning: Goal: Ability to manage health-related needs will improve Outcome: Progressing   Problem: Clinical Measurements: Goal: Respiratory complications will improve Outcome: Progressing Goal: Cardiovascular complication will be avoided Outcome: Progressing

## 2018-05-05 NOTE — Care Management Important Message (Signed)
Important Message  Patient Details  Name: Lawrence Hall MRN: [id number] Date of Birth: 04/18/1946   Medicare Important Message Given:  Yes    [name] 05/05/2018, 4:08 PM

## 2018-05-05 NOTE — Progress Notes (Addendum)
      [address]       [place] [address]             [phone]      6 Days Post-Op Procedure(s) (LRB): CORONARY ARTERY BYPASS GRAFTING (CABG) X 3; Using Left Internal Mammary Artery (LIMA) and Right Leg Greater Saphenous Vein Graft (SVG);  LIMA to LAD, SVG to OM1, SVG to RCA, (N/A) AORTIC VALVE REPLACEMENT (AVR) using INSPIRIS RESILIA  AORTIC VALVE 75mm (N/A) TRANSESOPHAGEAL ECHOCARDIOGRAM (TEE) (N/A) Subjective: Feels okay this morning. Cannot wait to get home and shower.   Objective: Vital signs in last 24 hours: Temp:  [97.7 F (36.5 C)-98.9 F (37.2 C)] 98.5 F (36.9 C) (01/16 0515) Pulse Rate:  [86-90] 86 (01/16 0515) Cardiac Rhythm: Atrial flutter;Atrial fibrillation (01/16 0700) Resp:  [15-29] 22 (01/16 0515) BP: (128-160)/(73-124) 158/85 (01/16 0515) SpO2:  [94 %-98 %] 94 % (01/16 0515) Weight:  [102 kg] 102 kg (01/16 0515)     Intake/Output from previous day: 01/15 0701 - 01/16 0700 In: 147.6 [I.V.:147.6] Out: -  Intake/Output this shift: No intake/output data recorded.  General appearance: alert, cooperative and no distress Heart: regular rate and rhythm, S1, S2 normal, no murmur, click, rub or gallop Lungs: clear to auscultation bilaterally Abdomen: soft, non-tender; bowel sounds normal; no masses,  no organomegaly Extremities: extremities normal, atraumatic, no cyanosis or edema Wound: clean and dry  Lab Results: Recent Labs    05/04/18 0203 05/05/18 0432  WBC 7.7 7.2  HGB 8.7* 9.6*  HCT 25.6* 28.3*  PLT 188 223   BMET:  Recent Labs    05/04/18 0203 05/05/18 0432  NA 139 140  K 3.7 4.2  CL 104 107  CO2 25 22  GLUCOSE 80 94  BUN 32* 28*  CREATININE 1.15 1.18  CALCIUM 8.1* 8.3*    PT/INR: No results for input(s): LABPROT, INR in the last 72 hours. ABG    Component Value Date/Time   PHART 7.347 (L) 04/30/2018 0000   HCO3 21.4 04/30/2018 0000   TCO2 22 04/30/2018 1618   ACIDBASEDEF 4.0 (H) 04/30/2018 0000   O2SAT 95.0 04/30/2018 0000   CBG (last 3)  Recent Labs    05/03/18 1707 05/03/18 2124 05/04/18 0735  GLUCAP 89 95 75    Assessment/Plan: S/P Procedure(s) (LRB): CORONARY ARTERY BYPASS GRAFTING (CABG) X 3; Using Left Internal Mammary Artery (LIMA) and Right Leg Greater Saphenous Vein Graft (SVG);  LIMA to LAD, SVG to OM1, SVG to RCA, (N/A) AORTIC VALVE REPLACEMENT (AVR) using INSPIRIS RESILIA  AORTIC VALVE 86mm (N/A) TRANSESOPHAGEAL ECHOCARDIOGRAM (TEE) (N/A)  1. CV-NSR in the 80s with PVCs. Some irregularities on tele. Tolerating Amio PO, BP well controlled. Continue ASA, statin and Metoprolol.  2. Pulm-Tolerating room air with good oxygen saturation. Encouraged use of incentive spirometer.  3. Renal-creatinine 1.18, electrolytes okay 4. H and H 9.6/28.3, expected acute blood loss anemia.  5. Endo-blood glucose well controlled off all insulin 6. Pain is well controlled.  Plan: discontinue EPW today. BP is slowly climbing-may need to start home Benicar. Ambulate in the halls today. Encouraged use of incentive spirometer.    LOS: 9 days    Elgie Collard 05/05/2018  agree with starting ARB prepare for DC in am  patient examined and medical record reviewed,agree with above note. [name] 05/05/2018

## 2018-05-05 NOTE — Progress Notes (Signed)
CARDIAC REHAB PHASE I   PRE:  Rate/Rhythm: 88 SR  95   SaO2: 95%RA  MODE:  Ambulation: 450 ft   POST:  Rate/Rhythm: 88 SR  BP:  Supine:   Sitting: 127/92  Standing:    SaO2: 96%RA 1105-1130 Pt walked 450 ft on RA with asst x 1. A little wobbly at times. To chair with call bell after walk. Set up lunch.  Remained in NSR but a few irregular beats. Tolerated well.   Graylon Good, RN BSN  05/05/2018 11:26 AM

## 2018-05-05 NOTE — Progress Notes (Signed)
EPWs pulled per oder.  All tips intact, sites unremarkable, Pt tolerated well.  Pt and wife understand bedrest for one hr with frequent VS.  CCMD notified, will monitor closely.

## 2018-05-06 MED ORDER — APIXABAN 5 MG PO TABS
5.0000 mg | ORAL_TABLET | Freq: Two times a day (BID) | ORAL | Status: DC
  Administered 2018-05-06 – 2018-05-07 (×3): 5 mg via ORAL
  Filled 2018-05-06 (×3): qty 1

## 2018-05-06 MED FILL — Electrolyte-R (PH 7.4) Solution: INTRAVENOUS | Qty: 4000 | Status: AC

## 2018-05-06 MED FILL — Sodium Bicarbonate IV Soln 8.4%: INTRAVENOUS | Qty: 50 | Status: AC

## 2018-05-06 MED FILL — Heparin Sodium (Porcine) Inj 1000 Unit/ML: INTRAMUSCULAR | Qty: 20 | Status: AC

## 2018-05-06 MED FILL — Mannitol IV Soln 20%: INTRAVENOUS | Qty: 500 | Status: AC

## 2018-05-06 MED FILL — Lidocaine HCl(Cardiac) IV PF Soln Pref Syr 100 MG/5ML (2%): INTRAVENOUS | Qty: 10 | Status: AC

## 2018-05-06 MED FILL — Sodium Chloride IV Soln 0.9%: INTRAVENOUS | Qty: 2000 | Status: AC

## 2018-05-06 NOTE — Progress Notes (Addendum)
      [address]       [place] [address]             [phone]      7 Days Post-Op Procedure(s) (LRB): CORONARY ARTERY BYPASS GRAFTING (CABG) X 3; Using Left Internal Mammary Artery (LIMA) and Right Leg Greater Saphenous Vein Graft (SVG);  LIMA to LAD, SVG to OM1, SVG to RCA, (N/A) AORTIC VALVE REPLACEMENT (AVR) using INSPIRIS RESILIA  AORTIC VALVE 24mm (N/A) TRANSESOPHAGEAL ECHOCARDIOGRAM (TEE) (N/A) Subjective: He still feels a bit unsteady on his feet. He is worried about going home and would feel more comfortable leaving tomorrow.  Objective: Vital signs in last 24 hours: Temp:  [97.8 F (36.6 C)-98.9 F (37.2 C)] 98.5 F (36.9 C) (01/17 0455) Pulse Rate:  [79-95] 84 (01/17 0455) Cardiac Rhythm: Atrial fibrillation;Atrial flutter (01/17 0700) Resp:  [17-24] 17 (01/17 0455) BP: (99-144)/(59-88) 127/68 (01/17 0455) SpO2:  [90 %-96 %] 93 % (01/17 0455) Weight:  [100.7 kg] 100.7 kg (01/17 0455)     Intake/Output from previous day: 01/16 0701 - 01/17 0700 In: 340 [P.O.:340] Out: -  Intake/Output this shift: No intake/output data recorded.  General appearance: alert, cooperative and no distress Heart: regular rate and rhythm, S1, S2 normal, no murmur, click, rub or gallop Lungs: clear to auscultation bilaterally Abdomen: soft, non-tender; bowel sounds normal; no masses,  no organomegaly Extremities: extremities normal, atraumatic, no cyanosis or edema Wound: clean and dry  Lab Results: Recent Labs    05/04/18 0203 05/05/18 0432  WBC 7.7 7.2  HGB 8.7* 9.6*  HCT 25.6* 28.3*  PLT 188 223   BMET:  Recent Labs    05/04/18 0203 05/05/18 0432  NA 139 140  K 3.7 4.2  CL 104 107  CO2 25 22  GLUCOSE 80 94  BUN 32* 28*  CREATININE 1.15 1.18  CALCIUM 8.1* 8.3*    PT/INR: No results for input(s): LABPROT, INR in the last 72 hours. ABG    Component Value Date/Time   PHART 7.347 (L) 04/30/2018 0000   HCO3 21.4 04/30/2018 0000   TCO2 22  04/30/2018 1618   ACIDBASEDEF 4.0 (H) 04/30/2018 0000   O2SAT 95.0 04/30/2018 0000   CBG (last 3)  Recent Labs    05/03/18 1707 05/03/18 2124 05/04/18 0735  GLUCAP 89 95 75    Assessment/Plan: S/P Procedure(s) (LRB): CORONARY ARTERY BYPASS GRAFTING (CABG) X 3; Using Left Internal Mammary Artery (LIMA) and Right Leg Greater Saphenous Vein Graft (SVG);  LIMA to LAD, SVG to OM1, SVG to RCA, (N/A) AORTIC VALVE REPLACEMENT (AVR) using INSPIRIS RESILIA  AORTIC VALVE 75mm (N/A) TRANSESOPHAGEAL ECHOCARDIOGRAM (TEE) (N/A)  1. CV-NSR in the 90s with PVCs, occasional rate-controlled afib. Tolerating Amio PO. Continue ASA, statin and Metoprolol. Started on ARB yesterday and BP better controlled 2. Pulm-Tolerating room air with good oxygen saturation. Encouraged use of incentive spirometer.  3. Renal-creatinine 1.18, electrolytes okay 4. H and H 9.6/28.3, expected acute blood loss anemia.  5. Endo-blood glucose well controlled off all insulin 6. Pain is well controlled.  Plan: Will likely need Eliquis on discharge due to rhythm. Working on walking in the hall and cardiac rehab. Will consult case management for financial questions and emotional support questions.    LOS: 10 days    Elgie Collard 05/06/2018  intermittent afib- start eliquis and stop lovenox Observe in hospital today patient examined and medical record reviewed,agree with above note. [name] 05/06/2018

## 2018-05-06 NOTE — Progress Notes (Signed)
CARDIAC REHAB PHASE  Up in hall with RN   MODE:  Ambulation: 920 ft   POST:  Rate/Rhythm: 101 ST   Some irregular beats at times BP:  Supine:   Sitting: 119/92  Standing:    SaO2: 98%RA 0940-1040 Pt up in hall with RN. Took over walk. Pt steadier than yesterday. Tolerated well. Walked 920 ft. To recliner after walk. Education completed with pt and wife who voiced understanding. Reviewed staying in the tube and sternal precautions, IS, ex ed and heart healthy food choices and watching sodium and carbs. Wrote down how to view discharge video. Pt would like to see case manager re shower chair and discharge needs. Pt has order for case manager consult. Discussed CRP 2 and referred to [facility]. Pt has attended before.   Graylon Good, RN BSN  05/06/2018 10:31 AM

## 2018-05-06 NOTE — Progress Notes (Addendum)
Received  Consult for counseling resources. Patient's wife states she wanted resources for depression and anxiety. She states that for more than 6 months the patient has not been himself. She  believes that he gets frustrated and aggravated a lot easier. She hopes to find a support group to that can address and alleviate some "outside stressors" he may be experiencing. Patient states that taking his medication and keeping his follow up appointments with his doctors will help him come back to"himself" but he is willing and agreeable to counseling. He states he is concerned his wife caring for him and her elderly mother. He believes it is causing stress for his wife.    CSW provided the patient and spouse with Mental Health out patient resources.   Clinical Social Worker will sign off for now as social work intervention os no longer needed. Please consult [place] if new need arises.    Thurmond Butts, [place] Social Worker [phone]

## 2018-05-06 NOTE — Discharge Instructions (Signed)
Discharge Instructions:  1. You may shower, please wash incisions daily with soap and water and keep dry.  If you wish to cover wounds with dressing you may do so but please keep clean and change daily.  No tub baths or swimming until incisions have completely healed.  If your incisions become red or develop any drainage please call our office at [phone]  2. No Driving until cleared by our office and you are no longer using narcotic pain medications  3. Monitor your weight daily.. Please use the same scale and weigh at same time... If you gain 3-5 lbs in 48 hours with associated lower extremity swelling, please contact our office at [phone]  4. Fever of 101.5 for at least 24 hours, please contact our office at [phone]  5. Activity- up as tolerated, please walk at least 3 times per day.  Avoid strenuous activity, no lifting, pushing, or pulling with your arms over 8-10 lbs for a minimum of 6 weeks  6. If any questions or concerns arise, please do not hesitate to contact our office at [phone]  Information on my medicine - ELIQUIS (apixaban)  This medication education was reviewed with me or my healthcare representative as part of my discharge preparation.  The pharmacist that spoke with me during my hospital stay was:  Ignatius Specking Shermaine Brigham, RPH  Why was Eliquis prescribed for you? Eliquis was prescribed for you to reduce the risk of a blood clot forming that can cause a stroke if you have a medical condition called atrial fibrillation (a type of irregular heartbeat).  What do You need to know about Eliquis ? Take your Eliquis TWICE DAILY - one tablet in the morning and one tablet in the evening with or without food. If you have difficulty swallowing the tablet whole please discuss with your pharmacist how to take the medication safely.  Take Eliquis exactly as prescribed by your doctor and DO NOT stop taking Eliquis without talking to the doctor who prescribed the medication.   Stopping may increase your risk of developing a stroke.  Refill your prescription before you run out.  After discharge, you should have regular check-up appointments with your healthcare provider that is prescribing your Eliquis.  In the future your dose may need to be changed if your kidney function or weight changes by a significant amount or as you get older.  What do you do if you miss a dose? If you miss a dose, take it as soon as you remember on the same day and resume taking twice daily.  Do not take more than one dose of ELIQUIS at the same time to make up a missed dose.  Important Safety Information A possible side effect of Eliquis is bleeding. You should call your healthcare provider right away if you experience any of the following: ? Bleeding from an injury or your nose that does not stop. ? Unusual colored urine (red or dark brown) or unusual colored stools (red or black). ? Unusual bruising for unknown reasons. ? A serious fall or if you hit your head (even if there is no bleeding).  Some medicines may interact with Eliquis and might increase your risk of bleeding or clotting while on Eliquis. To help avoid this, consult your healthcare provider or pharmacist prior to using any new prescription or non-prescription medications, including herbals, vitamins, non-steroidal anti-inflammatory drugs (NSAIDs) and supplements.  This website has more information on Eliquis (apixaban): http://www.eliquis.com/eliquis/home

## 2018-05-06 NOTE — Progress Notes (Signed)
Progress Note  Patient Name: Lawrence Hall Date of Encounter: 05/06/2018  Primary Cardiologist: Sanda Klein, MD   Subjective   Currently walking the hallway, no anginal symptoms.  Doing well.  Wanting to go home tomorrow.  Inpatient Medications    Scheduled Meds: . amiodarone  400 mg Oral BID  . apixaban  5 mg Oral BID  . aspirin EC  325 mg Oral Daily   Or  . aspirin  324 mg Per Tube Daily  . atorvastatin  80 mg Oral q1800  . bisacodyl  10 mg Oral Daily   Or  . bisacodyl  10 mg Rectal Daily  . docusate sodium  200 mg Oral Daily  . furosemide  40 mg Oral Daily  . irbesartan  75 mg Oral Daily  . mouth rinse  15 mL Mouth Rinse BID  . metoprolol tartrate  50 mg Oral BID   Or  . metoprolol tartrate  12.5 mg Per Tube BID  . pantoprazole  40 mg Oral Daily  . potassium chloride  20 mEq Oral Daily   Continuous Infusions:  PRN Meds: metoprolol tartrate, MUSCLE RUB, ondansetron (ZOFRAN) IV, oxyCODONE, sodium chloride flush, traMADol   Vital Signs    Vitals:   05/05/18 2002 05/05/18 2359 05/06/18 0455 05/06/18 0931  BP: 131/84 117/72 127/68 125/67  Pulse: 88 82 84 79  Resp: (!) 22 (!) 23 17 (!) 23  Temp: 98.9 F (37.2 C) 98.8 F (37.1 C) 98.5 F (36.9 C) 98.4 F (36.9 C)  TempSrc: Oral Oral Oral Oral  SpO2: 94% 90% 93% 93%  Weight:   100.7 kg   Height:        Intake/Output Summary (Last 24 hours) at 05/06/2018 0946 Last data filed at 05/05/2018 1100 Gross per 24 hour  Intake 160 ml  Output -  Net 160 ml   Last 3 Weights 05/06/2018 05/05/2018 05/04/2018  Weight (lbs) 222 lb 0.1 oz 224 lb 13.9 oz 226 lb 13.7 oz  Weight (kg) 100.7 kg 102 kg 102.9 kg      Telemetry    Currently atrial fibrillation with rate control, walking well.  80s- Personally Reviewed  ECG    No new- Personally Reviewed  Physical Exam   GEN: No acute distress.   Neck: No JVD Cardiac:  Regularly irregular, no murmurs, rubs, or gallops.  CABG scar Respiratory: Clear to auscultation  bilaterally. GI: Soft, nontender, non-distended  MS: No edema; No deformity. Neuro:  Nonfocal  Psych: Normal affect   Labs    Chemistry Recent Labs  Lab 05/03/18 0209 05/04/18 0203 05/05/18 0432  NA 139 139 140  K 3.7 3.7 4.2  CL 107 104 107  CO2 24 25 22   GLUCOSE 76 80 94  BUN 34* 32* 28*  CREATININE 1.22 1.15 1.18  CALCIUM 7.9* 8.1* 8.3*  GFRNONAA 58* >60 >60  GFRAA >60 >60 >60  ANIONGAP 8 10 11      Hematology Recent Labs  Lab 05/03/18 0209 05/04/18 0203 05/05/18 0432  WBC 9.9 7.7 7.2  RBC 2.94* 2.93* 3.27*  HGB 8.5* 8.7* 9.6*  HCT 25.7* 25.6* 28.3*  MCV 87.4 87.4 86.5  MCH 28.9 29.7 29.4  MCHC 33.1 34.0 33.9  RDW 13.8 13.6 13.6  PLT 155 188 223    Cardiac EnzymesNo results for input(s): TROPONINI in the last 168 hours. No results for input(s): TROPIPOC in the last 168 hours.   BNPNo results for input(s): BNP, PROBNP in the last 168 hours.  DDimer No results for input(s): DDIMER in the last 168 hours.   Radiology    No results found.  Cardiac Studies   Prior cath showed moderate aortic stenosis and multivessel CAD  Patient Profile     73 y.o. male CAD post CABG aortic valve replacement 25 mm bioprosthetic valve with postoperative atrial fibrillation  Assessment & Plan    Postop atrial fibrillation, paroxysmal -Continue with amiodarone, on discharge would decrease dosing to 200 mg twice daily for the next 2 weeks and then 200 mg a day thereafter.  Chronic anticoagulation -Since atrial fibrillation has come back with rate control, I agree with utilization of Eliquis, anticoagulation.  CAD post CABG and aortic valve replacement -Progressing well.  Should be able to go home tomorrow.  Prior history of non-STEMI -Left circumflex stent 2017, patent on cardiac catheterization  Hyperlipidemia -High intensity statin therapy LDL 69 in the outpatient setting.  Continue to monitor.     For questions or updates, please contact [place] Please consult [email or web address] for contact info under        Signed, Candee Furbish, MD  05/06/2018, 9:46 AM

## 2018-05-06 NOTE — Progress Notes (Addendum)
Pt ambulate 470'.  Decided to take rolling walker due to still feeling a little light headed at beginning of walk.  Otherwise he tolerated very well on room air.  Back to recliner, call bell within reach.

## 2018-05-06 NOTE — Care Management (Signed)
#    6   S/W  ALESIA  @ [facility] [employer] # [phone] OPT- 2   ELIQUIS  5 MG BID COVER- YES CO-PAY- $ 45.00 TIER- 3  DRUG PRIOR APPROVAL- NO  NO DEDUCTIBLE

## 2018-05-06 NOTE — Progress Notes (Addendum)
Pt felt lightheaded after getting up from trip to bathroom. He sat down in chair at the sink until he felt composed enough to walk back to bed.  BP 98/62.  Pt back to bed, resting, call bell within reach.  Will continue to monitor.

## 2018-05-06 NOTE — Progress Notes (Addendum)
Pt ambulated 57' in hall, standby assist, room air.  Cardiac rehab took over walking with pt after he ambulated the full hall with me.

## 2018-05-06 NOTE — Progress Notes (Signed)
Pt ambulated 28' standby assist, room air.  Tolerated very well.  Back to bed, call bell within reach.

## 2018-05-06 NOTE — Care Management Note (Signed)
Case Management Note Marvetta Gibbons RN, BSN Transitions of Care Unit 4E- RN Case Manager [phone]  Patient Details  Name: Lawrence Hall MRN: [id number] Date of Birth: 1945/07/07  Subjective/Objective:    Pt admitted s/p CABG x3 and AVR                Action/Plan: PTA pt lived at home with wife, requested to see pt for transition of care needs and concerns over insurance coverage. CM spoke with pt at bedside- per pt he is to return home with wife- wife is also caregiver for her elderly mother and possibly per pt stressed with caregiver burden. Pt requesting info on shower chairs- explained that Medicare does not cover shower chairs however will cover a 3n1 if pt thinks it will fit in his shower- pt states he will have wife measure and let staff know if 3n1 will work- if so will need DME order for 3n1 and CM can arrange prior to discharge pt does not feel he needs a RW. Pt will go home on Eliquis- copay cost $45 which pt was informed and provided 30 day free card to use on discharge- pt states he does not think he will be on Eliquis longer than 30 days. Discussed possible HH which pt states he will speak with his wife on - CM will f/u if ordered by MD.    Expected Discharge Date:                  Expected Discharge Plan:     In-House Referral:     Discharge planning Services  CM Consult  Post Acute Care Choice:  Durable Medical Equipment Choice offered to:  Patient  DME Arranged:  3-N-1 DME Agency:  [place]:    [place] Agency:     Status of Service:  In process, will continue to follow  If discussed at Long Length of Stay Meetings, dates discussed:    Discharge Disposition:   Additional Comments:  Dawayne Patricia, RN 05/06/2018, 3:36 PM

## 2018-05-07 DIAGNOSIS — E785 Hyperlipidemia, unspecified: Secondary | ICD-10-CM

## 2018-05-07 DIAGNOSIS — I4891 Unspecified atrial fibrillation: Secondary | ICD-10-CM

## 2018-05-07 LAB — CBC
HCT: 30.7 % — ABNORMAL LOW (ref 39.0–52.0)
Hemoglobin: 10.3 g/dL — ABNORMAL LOW (ref 13.0–17.0)
MCH: 29.3 pg (ref 26.0–34.0)
MCHC: 33.6 g/dL (ref 30.0–36.0)
MCV: 87.5 fL (ref 80.0–100.0)
Platelets: 292 10*3/uL (ref 150–400)
RBC: 3.51 MIL/uL — ABNORMAL LOW (ref 4.22–5.81)
RDW: 14.6 % (ref 11.5–15.5)
WBC: 9.2 10*3/uL (ref 4.0–10.5)
nRBC: 0 % (ref 0.0–0.2)

## 2018-05-07 LAB — GLUCOSE, CAPILLARY
Glucose-Capillary: 138 mg/dL — ABNORMAL HIGH (ref 70–99)
Glucose-Capillary: 143 mg/dL — ABNORMAL HIGH (ref 70–99)

## 2018-05-07 MED ORDER — OXYCODONE HCL 5 MG PO TABS: 5.0000 mg | ORAL_TABLET | Freq: Four times a day (QID) | ORAL | 0 refills | Status: AC | PRN

## 2018-05-07 MED ORDER — IRBESARTAN 75 MG PO TABS: 75.0000 mg | ORAL_TABLET | Freq: Every day | ORAL | 1 refills | Status: DC

## 2018-05-07 MED ORDER — POTASSIUM CHLORIDE CRYS ER 20 MEQ PO TBCR: 20.0000 meq | EXTENDED_RELEASE_TABLET | Freq: Every day | ORAL | 0 refills | Status: DC

## 2018-05-07 MED ORDER — AMIODARONE HCL 200 MG PO TABS: 200.0000 mg | ORAL_TABLET | Freq: Two times a day (BID) | ORAL | 1 refills | Status: DC

## 2018-05-07 MED ORDER — FUROSEMIDE 40 MG PO TABS: 40.0000 mg | ORAL_TABLET | Freq: Every day | ORAL | 0 refills | Status: DC

## 2018-05-07 MED ORDER — ATORVASTATIN CALCIUM 80 MG PO TABS: 80.0000 mg | ORAL_TABLET | Freq: Every day | ORAL | 1 refills | Status: DC

## 2018-05-07 MED ORDER — ASPIRIN 325 MG PO TBEC: 325.0000 mg | DELAYED_RELEASE_TABLET | Freq: Every day | ORAL | Status: DC

## 2018-05-07 MED ORDER — APIXABAN 5 MG PO TABS: 5.0000 mg | ORAL_TABLET | Freq: Two times a day (BID) | ORAL | 1 refills | Status: DC

## 2018-05-07 MED ORDER — METOPROLOL TARTRATE 50 MG PO TABS: 50.0000 mg | ORAL_TABLET | Freq: Two times a day (BID) | ORAL | 1 refills | Status: DC

## 2018-05-07 NOTE — Progress Notes (Signed)
[address]       [place] [address]             [phone]      8 Days Post-Op Procedure(s) (LRB): CORONARY ARTERY BYPASS GRAFTING (CABG) X 3; Using Left Internal Mammary Artery (LIMA) and Right Leg Greater Saphenous Vein Graft (SVG);  LIMA to LAD, SVG to OM1, SVG to RCA, (N/A) AORTIC VALVE REPLACEMENT (AVR) using INSPIRIS RESILIA  AORTIC VALVE 17mm (N/A) TRANSESOPHAGEAL ECHOCARDIOGRAM (TEE) (N/A) Subjective: Feels better  Objective: Vital signs in last 24 hours: Temp:  [97.1 F (36.2 C)-99.3 F (37.4 C)] 99.3 F (37.4 C) (01/18 0441) Pulse Rate:  [79-110] 110 (01/17 1933) Cardiac Rhythm: Atrial flutter;Bundle branch block (01/18 0700) Resp:  [18-26] 24 (01/18 0441) BP: (98-125)/(62-74) 120/74 (01/18 0441) SpO2:  [93 %-96 %] 96 % (01/18 0441) Weight:  [100.1 kg] 100.1 kg (01/18 0441)  Hemodynamic parameters for last 24 hours:    Intake/Output from previous day: No intake/output data recorded. Intake/Output this shift: No intake/output data recorded.  General appearance: alert, cooperative and no distress Heart: irregularly irregular rhythm Lungs: mildly dim in bases Abdomen: benign Extremities: trace edema Wound: incis healing well  Lab Results: Recent Labs    05/05/18 0432 05/07/18 0343  WBC 7.2 9.2  HGB 9.6* 10.3*  HCT 28.3* 30.7*  PLT 223 292   BMET:  Recent Labs    05/05/18 0432  NA 140  K 4.2  CL 107  CO2 22  GLUCOSE 94  BUN 28*  CREATININE 1.18  CALCIUM 8.3*    PT/INR: No results for input(s): LABPROT, INR in the last 72 hours. ABG    Component Value Date/Time   PHART 7.347 (L) 04/30/2018 0000   HCO3 21.4 04/30/2018 0000   TCO2 22 04/30/2018 1618   ACIDBASEDEF 4.0 (H) 04/30/2018 0000   O2SAT 95.0 04/30/2018 0000   CBG (last 3)  Recent Labs    05/04/18 0735  GLUCAP 75    Meds Scheduled Meds: . amiodarone  400 mg Oral BID  . apixaban  5 mg Oral BID  . aspirin EC  325 mg Oral Daily   Or  . aspirin   324 mg Per Tube Daily  . atorvastatin  80 mg Oral q1800  . bisacodyl  10 mg Oral Daily   Or  . bisacodyl  10 mg Rectal Daily  . docusate sodium  200 mg Oral Daily  . furosemide  40 mg Oral Daily  . irbesartan  75 mg Oral Daily  . mouth rinse  15 mL Mouth Rinse BID  . metoprolol tartrate  50 mg Oral BID   Or  . metoprolol tartrate  12.5 mg Per Tube BID  . pantoprazole  40 mg Oral Daily  . potassium chloride  20 mEq Oral Daily   Continuous Infusions: PRN Meds:.metoprolol tartrate, MUSCLE RUB, ondansetron (ZOFRAN) IV, oxyCODONE, sodium chloride flush, traMADol  Xrays No results found.  Assessment/Plan: S/P Procedure(s) (LRB): CORONARY ARTERY BYPASS GRAFTING (CABG) X 3; Using Left Internal Mammary Artery (LIMA) and Right Leg Greater Saphenous Vein Graft (SVG);  LIMA to LAD, SVG to OM1, SVG to RCA, (N/A) AORTIC VALVE REPLACEMENT (AVR) using INSPIRIS RESILIA  AORTIC VALVE 58mm (N/A) TRANSESOPHAGEAL ECHOCARDIOGRAM (TEE) (N/A)  1 hemodyn stable in rate controlled aflutter 2 sats good on RA 3 tolerating cardiac rehab well 4 H/H imprpved 5 stable for d/c   LOS: 11 days    Beauregard Giovanni [facility] 05/07/2018 Pager [phone] 

## 2018-05-07 NOTE — Progress Notes (Signed)
05/07/2018 1100 Chest tube sutures removed.  Pt tolerated well. [place]

## 2018-05-07 NOTE — Progress Notes (Signed)
05/07/2018 1:52 PM Discharge AVS meds taken today and those due this evening reviewed.  Follow-up appointments and when to call md reviewed.  D/C IV and TELE.  Questions and concerns addressed.   D/C home per orders. [place]

## 2018-05-07 NOTE — Progress Notes (Signed)
Pt feeling well, eating breakfast. For d/c. No questions. Reinforced IS and walking.  Yves Dill CES, ACSM 7:50 AM 05/07/2018

## 2018-05-07 NOTE — Care Management (Signed)
Pt requests 3n1 be delivered to take home.  3n1 ordered from [facility] for delivery to room prior to d/c.

## 2018-05-07 NOTE — Progress Notes (Signed)
Progress Note  Patient Name: Lawrence Hall Date of Encounter: 05/07/2018  Primary Cardiologist: Sanda Klein, MD   Subjective   Btreathing is OK  Chest sore    Inpatient Medications    Scheduled Meds: . amiodarone  400 mg Oral BID  . apixaban  5 mg Oral BID  . aspirin EC  325 mg Oral Daily   Or  . aspirin  324 mg Per Tube Daily  . atorvastatin  80 mg Oral q1800  . bisacodyl  10 mg Oral Daily   Or  . bisacodyl  10 mg Rectal Daily  . docusate sodium  200 mg Oral Daily  . furosemide  40 mg Oral Daily  . irbesartan  75 mg Oral Daily  . mouth rinse  15 mL Mouth Rinse BID  . metoprolol tartrate  50 mg Oral BID   Or  . metoprolol tartrate  12.5 mg Per Tube BID  . pantoprazole  40 mg Oral Daily  . potassium chloride  20 mEq Oral Daily   Continuous Infusions:  PRN Meds: metoprolol tartrate, MUSCLE RUB, ondansetron (ZOFRAN) IV, oxyCODONE, sodium chloride flush, traMADol   Vital Signs    Vitals:   05/06/18 1212 05/06/18 1548 05/06/18 1933 05/07/18 0441  BP: 98/62 107/68 123/69 120/74  Pulse: 85 (!) 109 (!) 110   Resp: (!) 26 (!) 22 18 (!) 24  Temp: (!) 97.1 F (36.2 C)  98.4 F (36.9 C) 99.3 F (37.4 C)  TempSrc: Oral  Oral Oral  SpO2: 94% 95% 93% 96%  Weight:    100.1 kg  Height:       No intake or output data in the 24 hours ending 05/07/18 0810 Last 3 Weights 05/07/2018 05/06/2018 05/05/2018  Weight (lbs) 220 lb 10.9 oz 222 lb 0.1 oz 224 lb 13.9 oz  Weight (kg) 100.1 kg 100.7 kg 102 kg      Telemetry   Afib  70s  - Personally Reviewed  ECG    No new- Personally Reviewed  Physical Exam   GEN: No acute distress.   Neck: No JVD Chest   Incision dry   Cardiac:  Regularly irregular, no murmurs, rubs, or gallops.  CABG scar Respiratory: Clear to auscultation bilaterally. GI: Soft, nontender, non-distended  MS: No edema; No deformity. Neuro:  Nonfocal  Psych: Normal affect   Labs    Chemistry Recent Labs  Lab 05/03/18 0209 05/04/18 0203  05/05/18 0432  NA 139 139 140  K 3.7 3.7 4.2  CL 107 104 107  CO2 24 25 22   GLUCOSE 76 80 94  BUN 34* 32* 28*  CREATININE 1.22 1.15 1.18  CALCIUM 7.9* 8.1* 8.3*  GFRNONAA 58* >60 >60  GFRAA >60 >60 >60  ANIONGAP 8 10 11      Hematology Recent Labs  Lab 05/04/18 0203 05/05/18 0432 05/07/18 0343  WBC 7.7 7.2 9.2  RBC 2.93* 3.27* 3.51*  HGB 8.7* 9.6* 10.3*  HCT 25.6* 28.3* 30.7*  MCV 87.4 86.5 87.5  MCH 29.7 29.4 29.3  MCHC 34.0 33.9 33.6  RDW 13.6 13.6 14.6  PLT 188 223 292    Cardiac EnzymesNo results for input(s): TROPONINI in the last 168 hours. No results for input(s): TROPIPOC in the last 168 hours.   BNPNo results for input(s): BNP, PROBNP in the last 168 hours.   DDimer No results for input(s): DDIMER in the last 168 hours.   Radiology    No results found.  Cardiac Studies   Prior cath  showed moderate aortic stenosis and multivessel CAD  Patient Profile     73 y.o. male CAD post CABG aortic valve replacement 25 mm bioprosthetic valve with postoperative atrial fibrillation  Assessment & Plan    Postop atrial fibrillation REmains in Afib  Rates controlled   -Continue with amiodarone, on discharge would decrease dosing to 200 mg twice daily for the next 2 weeks  Chronic anticoagulation Continue Eliquis  Bid    CAD post CABG and aortic valve replacement Plan for  D/C today     Hyperlipidemia -High intensity statin therapy LDL 69 in the outpatient setting.  Continue to monitor.     For questions or updates, please contact [place] Please consult [email or web address] for contact info under        Signed, Dorris Carnes, MD  05/07/2018, 8:10 AM

## 2018-05-09 ENCOUNTER — Telehealth: Payer: Self-pay | Admitting: Cardiovascular Disease

## 2018-05-09 ENCOUNTER — Other Ambulatory Visit (HOSPITAL_COMMUNITY): Payer: Self-pay | Admitting: Dentistry

## 2018-05-09 ENCOUNTER — Telehealth: Payer: Self-pay

## 2018-05-09 MED ORDER — CHLORHEXIDINE GLUCONATE 0.12 % MT SOLN: OROMUCOSAL | 5 refills | Status: DC

## 2018-05-09 NOTE — Telephone Encounter (Signed)
-----   Message from Ivin Poot, MD sent at 05/09/2018 12:12 PM EST ----- Agree thanks ----- Message ----- From: Donnella Sham, RN Sent: 05/09/2018   9:37 AM EST To: Ivin Poot, MD  Hey Dr. Prescott Gum,  Patient's wife contacted the office stated Dr. Enrique Sack requested the patient go home with Chlorhexidine mouth wash to be used twice a day for the next 3- 6 months.  She stated that a prescription was not given at discharge.  Is this ok to fill?  In the meantime, since you were in the OR today, I advised the patient's husband to contact Dr. Ritta Slot office to get that prescription.  Thanks,  Caryl Pina

## 2018-05-09 NOTE — Telephone Encounter (Signed)
Returned call to patient's wife no answer.LMTC. 

## 2018-05-09 NOTE — Telephone Encounter (Signed)
Patient's wife contacted the office requesting a mouth wash that was prescribed by Dr. Enrique Sack in a dental consult in the hospital.  She stated that the prescription was not given to her at discharge.  I advised her to contact Dr. Ritta Slot office to get this prescription.  She acknowledged receipt.

## 2018-05-09 NOTE — Telephone Encounter (Signed)
New Message   Patients wife is calling on behalf of the patients updated medication that he was given at the time of discharge. She also wants to notify that the patient is not taking in enough fluid. She just wants to make sure that she is doing every thing correctly.

## 2018-05-10 NOTE — Telephone Encounter (Signed)
Received a call back from patient's wife.She stated husband has developed a dry non productive cough since yesterday.Spoke to DOD Dr.Crenshaw he advised to monitor and if becomes worse to call back.Advised to keep appointment as planned with Almyra Deforest 05/20/18.She also wanted to know about medications.Husband was told his vitamins are optional.Stated all the vitamins are mentioned on discharge summary except no Vit B12.Advised to take medications as listed on discharge summary.

## 2018-05-12 ENCOUNTER — Telehealth: Payer: Self-pay | Admitting: Cardiovascular Disease

## 2018-05-12 NOTE — Telephone Encounter (Signed)
  Patient would like to clarify some medication instructions that were given to his wife

## 2018-05-12 NOTE — Telephone Encounter (Signed)
SPOKE TO PATIENT -- WENT OVER  CURRENT  MEDICATION LIST  AND HOW THE PATIENT IS TAKING DAILY MEDICATION . PATIENT WANTED TO KNOW IF HE COULD [place] ASPERCREME OR ICY HOT FOR PATIN IV SITE , HE STATES HE DOES NT WANT TO USE THE OXYCODONE.   RN INFORMED PATIENT HE  COULD BUT FOLLOW INSTRUCTIONS  AND DO NOT TAKE ANY ADDITIONAL ASPIRIN SUPPLEMENTS. HE COULD USE TYLENOL IF NEEDED INFORM PATIENT TO BRING ALL MEDICATIONS TO OFFICE APPOINTMENT INCLUDING VITAMINS

## 2018-05-16 ENCOUNTER — Telehealth (HOSPITAL_COMMUNITY): Payer: Self-pay

## 2018-05-16 NOTE — Telephone Encounter (Signed)
Called patient to see if he is interested in the Cardiac Rehab Program. Patient expressed interest. Explained scheduling process and went over insurance, patient verbalized understanding. Will contact patient for scheduling once f/u has been completed.  °

## 2018-05-16 NOTE — Telephone Encounter (Signed)
Pt insurance is active and benefits verified through [employer]. Co-pay $15.00, DED $0.00/$0.00 met, out of pocket $3,400.00/$0.00 met, co-insurance 0%. No pre-authorization required. [place]/[employer], 05/12/2018 @ 425PM, REF# [id number]  Will contact patient to see if he is interested in the Cardiac Rehab Program. If interested, patient will need to complete follow up appt. Once completed, patient will be contacted for scheduling upon review by the RN Navigator.

## 2018-05-17 ENCOUNTER — Telehealth: Payer: Self-pay

## 2018-05-17 ENCOUNTER — Telehealth: Payer: Self-pay | Admitting: Cardiovascular Disease

## 2018-05-17 MED ORDER — ONDANSETRON HCL 4 MG PO TABS: 4.0000 mg | ORAL_TABLET | Freq: Three times a day (TID) | ORAL | 0 refills | Status: DC | PRN

## 2018-05-17 NOTE — Telephone Encounter (Signed)
Returned call to patient's wife Dr.[name]'s advice given.Zofran prescription called in to pharmacy.Advised to call surgeon's office to discuss pain meds.Patient has post hospital appointment tomorrow 1/29 with Dr.[name].

## 2018-05-17 NOTE — Telephone Encounter (Signed)
New Message         Patient's wife is calling in today because the patient is having a hard time with the pain medications. Patient is unable to take the medication. Patient is in pain and vomiting. Pls call and advise.

## 2018-05-17 NOTE — Telephone Encounter (Signed)
Returned call to patient he stated he was discharged from hospital 8 days ago.He continues to have severe pain in right arm.Stated he had IV's and a cath in same arm.Arm is not swollen or warm to touch,just continues to ache.Stated he took too much pain medication this morning and now he is nauseated.He took 15 mg of Oxycodone and 500 mg of Tylenol within 6 hours.Stated he would like something for nausea.I will send message to Dr.[name] for advice.

## 2018-05-17 NOTE — Telephone Encounter (Signed)
Spoke to patient Dr.[name] has a cancellation tomorrow 1/29.Stated he will take appointment with Dr.[name] 1/29 at 8:00 am.

## 2018-05-17 NOTE — Telephone Encounter (Signed)
OK to give a Rx for zofran 4 mg Q8hours prn nausea, # 20, no Rf. But I would ask him to get in touch w the surgery office to discuss the pain meds. MCr

## 2018-05-18 ENCOUNTER — Ambulatory Visit (INDEPENDENT_AMBULATORY_CARE_PROVIDER_SITE_OTHER): Payer: PPO | Admitting: Cardiovascular Disease

## 2018-05-18 ENCOUNTER — Encounter: Payer: Self-pay | Admitting: Cardiovascular Disease

## 2018-05-18 VITALS — BP 110/62 | HR 75 | Ht 72.0 in | Wt 214.0 lb

## 2018-05-18 DIAGNOSIS — Z953 Presence of xenogenic heart valve: Secondary | ICD-10-CM | POA: Diagnosis not present

## 2018-05-18 DIAGNOSIS — I10 Essential (primary) hypertension: Secondary | ICD-10-CM | POA: Diagnosis not present

## 2018-05-18 DIAGNOSIS — I25118 Atherosclerotic heart disease of native coronary artery with other forms of angina pectoris: Secondary | ICD-10-CM | POA: Diagnosis not present

## 2018-05-18 DIAGNOSIS — E7800 Pure hypercholesterolemia, unspecified: Secondary | ICD-10-CM | POA: Diagnosis not present

## 2018-05-18 DIAGNOSIS — I4891 Unspecified atrial fibrillation: Secondary | ICD-10-CM

## 2018-05-18 DIAGNOSIS — I9789 Other postprocedural complications and disorders of the circulatory system, not elsewhere classified: Secondary | ICD-10-CM

## 2018-05-18 DIAGNOSIS — I447 Left bundle-branch block, unspecified: Secondary | ICD-10-CM

## 2018-05-18 MED ORDER — AMIODARONE HCL 200 MG PO TABS: 200.0000 mg | ORAL_TABLET | Freq: Every day | ORAL | 1 refills | Status: DC

## 2018-05-18 NOTE — Patient Instructions (Signed)
Medication Instructions:  DECREASE AMIODARONE TO 200 MG ONCE DAILY  STOP FUROSEMIDE  STOP POTASSIUM If you need a refill on your cardiac medications before your next appointment, please call your pharmacy.   Lab work: If you have labs (blood work) drawn today and your tests are completely normal, you will receive your results only by: [name] MyChart Message (if you have MyChart) OR . A paper copy in the mail If you have any lab test that is abnormal or we need to change your treatment, we will call you to review the results.    Follow-Up: At [facility], you and your health needs are our priority.  As part of our continuing mission to provide you with exceptional heart care, we have created designated Provider Care Teams.  These Care Teams include your primary Cardiologist (physician) and Advanced Practice Providers (APPs -  Physician Assistants and Nurse Practitioners) who all work together to provide you with the care you need, when you need it.  Your physician recommends that you schedule a follow-up appointment in: [place]

## 2018-05-18 NOTE — Progress Notes (Signed)
Patient ID: Lawrence Hall, male   DOB: 05-14-1945, 73 y.o.   MRN: [id number] Patient ID: Lawrence Hall, male   DOB: December 31, 1945, 73 y.o.   MRN: [id number]    Cardiology Office Note    Date:  05/18/2018   ID:  Lawrence Hall, Lawrence Hall Lawrence Hall, MRN [id number]  PCP:  Fanny Bien, MD  Cardiologist:   Sanda Klein, MD   Chief Complaint  Patient presents with  . Follow-up    pt c/o pain in right arm, pt denied chest pain    History of Present Illness:  Lawrence Hall is a 73 y.o. male with hyperlipidemia,, hypertension, coronary artery disease, history of non-ST segment elevation myocardial infarction in 2017 due to a high-grade stenosis in the (proximal left circumflex coronary drug-eluting stent, 3.534 Resolute), returning with exertional angina/dyspnea and hypotensive response to stress testing in January 2020, found to have left main and proximal right coronary stenoses.  He therefore underwent CABG x3 (LIMA to LAD, SVG to OM, SVG to RCA) and aortic valve replacement with a 25 mm Edwards pericardial valve by Dr. Prescott Gum on April 29, 2018.  Postoperative course was complicated by atrial fibrillation and development of a left bundle branch block, superficial vein irritation probably from amiodarone.  He was having a lot of pain in his right arm yesterday at the site of his previous IV and took several oxycodone in rapid succession for total of 15 mg as well as 500 mg of additional acetaminophen.  He then developed nausea.  The arm is hurting him more than the sternotomy site, but it is better today.  He denies dyspnea.  He is walking around the house.  He has not yet started cardiac rehab.  He denies fever or chills.  He is not coughing.  His electrocardiogram today shows sinus rhythm with left bundle branch block and first-degree AV block.  The echo performed on January 8, before the bypass/AVR showed a peak aortic valve gradient of 50 and a mean of 26 mmHg, vigorous left ventricle the ejection  fraction of 65-70%.  Discharge hemoglobin was 10.3.  Last recorded weight in the hospital was 105 kg (231 pounds), he weighs 214 pounds today.  He weighed 227 pounds when I last saw him in clinic on December 12.  Past Medical History:  Diagnosis Date  . Arthritis    mild hands  . Asthma   . Cataract    early signs b/l eyes  . Dysrhythmia    PAC'S  . ED (erectile dysfunction)   . H/O hiatal hernia   . Hypercholesterolemia   . Hypertension    MARGINALLY HIGH - NO MEDS -MONITORS  . Prostate cancer ([facility]) 03/27/2011   prostate bx=adenocarcinoma,  . Skin cancer   . Skin cancer 2008   basal cell face /removed  . Sleep apnea    USES NOSTRIL APARATUS - NO C-PAP- "BORDERLINE SLEEP APMNEA"    Past Surgical History:  Procedure Laterality Date  . AORTIC VALVE REPLACEMENT N/A 04/29/2018   Procedure: AORTIC VALVE REPLACEMENT (AVR) using INSPIRIS RESILIA  AORTIC VALVE 85mm;  Surgeon: Ivin Poot, MD;  Location: [facility];  Service: Open Heart Surgery;  Laterality: N/A;  . basal cell cancer  2008   inside nose  . CARDIAC CATHETERIZATION N/A 06/03/2015   Procedure: Left Heart Cath and Coronary Angiography;  Surgeon: [name] Sine, MD;  Location: [facility];  Service: Cardiovascular;  Laterality: N/A;  . CORONARY ARTERY BYPASS GRAFT  N/A 04/29/2018   Procedure: CORONARY ARTERY BYPASS GRAFTING (CABG) X 3; Using Left Internal Mammary Artery (LIMA) and Right Leg Greater Saphenous Vein Graft (SVG);  LIMA to LAD, SVG to OM1, SVG to RCA,;  Surgeon: Ivin Poot, MD;  Location: [facility];  Service: Open Heart Surgery;  Laterality: N/A;  . RIGHT/LEFT HEART CATH AND CORONARY ANGIOGRAPHY N/A 04/26/2018   Procedure: RIGHT/LEFT HEART CATH AND CORONARY ANGIOGRAPHY;  Surgeon: [name] Sine, MD;  Location: [facility];  Service: Cardiovascular;  Laterality: N/A;  . ROBOT ASSISTED LAPAROSCOPIC RADICAL PROSTATECTOMY  08/10/2011   Procedure: ROBOTIC ASSISTED LAPAROSCOPIC RADICAL PROSTATECTOMY LEVEL 2;   Surgeon: Dutch Gray, MD;  Location: [facility];  Service: Urology;  Laterality: N/A;  . TEE WITHOUT CARDIOVERSION N/A 04/29/2018   Procedure: TRANSESOPHAGEAL ECHOCARDIOGRAM (TEE);  Surgeon: Prescott Gum, Collier Salina, MD;  Location: [facility];  Service: Open Heart Surgery;  Laterality: N/A;  . THYROID SURGERY  age [age]    Current Medications: Outpatient Medications Prior to Visit  Medication Sig Dispense Refill  . apixaban (ELIQUIS) 5 MG TABS tablet Take 1 tablet (5 mg total) by mouth 2 (two) times daily. 60 tablet 1  . aspirin EC 325 MG EC tablet Take 1 tablet (325 mg total) by mouth daily.    [name] atorvastatin (LIPITOR) 80 MG tablet Take 1 tablet (80 mg total) by mouth daily at 6 PM. 30 tablet 1  . chlorhexidine (PERIDEX) 0.12 % solution Rinse with 15 mls twice daily for 30 seconds. Use after breakfast and at bedtime. Spit out excess. Do not swallow. 960 mL 5  . Cholecalciferol (VITAMIN D-3) 125 MCG (5000 UT) TABS Take 5,000 Units by mouth daily.    . Coenzyme Q10 (COQ10) 200 MG CAPS Take 200 mg by mouth daily.    . irbesartan (AVAPRO) 75 MG tablet Take 1 tablet (75 mg total) by mouth daily. 30 tablet 1  . MAGNESIUM-ZINC PO Take 1 tablet by mouth daily.     [name] MEGARED OMEGA-3 KRILL OIL 500 MG CAPS Take 1 capsule by mouth daily.     . metoprolol tartrate (LOPRESSOR) 50 MG tablet Take 1 tablet (50 mg total) by mouth 2 (two) times daily. 60 tablet 1  . ondansetron (ZOFRAN) 4 MG tablet Take 1 tablet (4 mg total) by mouth every 8 (eight) hours as needed for nausea or vomiting. 20 tablet 0  . oxyCODONE (OXY IR/ROXICODONE) 5 MG immediate release tablet Take 5 mg by mouth as needed for severe pain.    . vitamin E 400 UNIT capsule Take 400 Units by mouth daily.    [name] amiodarone (PACERONE) 200 MG tablet Take 1 tablet (200 mg total) by mouth 2 (two) times daily. For 14 days, then 200 mg once daily 60 tablet 1  . furosemide (LASIX) 40 MG tablet Take 1 tablet (40 mg total) by mouth daily. 7 tablet 0  . potassium chloride  (K-DUR,KLOR-CON) 20 MEQ tablet Take 1 tablet (20 mEq total) by mouth daily. 7 tablet 0   No facility-administered medications prior to visit.      Allergies:   Other   Social History   Socioeconomic History  . Marital status: Married    Spouse name: Not on file  . Number of children: Not on file  . Years of education: Not on file  . Highest education level: Not on file  Occupational History    Employer: [employer]    Comment: customer service [employer]  Social Needs  .  Financial resource strain: Not on file  . Food insecurity:    Worry: Not on file    Inability: Not on file  . Transportation needs:    Medical: Not on file    Non-medical: Not on file  Tobacco Use  . Smoking status: Never Smoker  . Smokeless tobacco: Never Used  Substance and Sexual Activity  . Alcohol use: Yes    Comment: Occasional  . Drug use: No  . Sexual activity: Not on file  Lifestyle  . Physical activity:    Days per week: Not on file    Minutes per session: Not on file  . Stress: Not on file  Relationships  . Social connections:    Talks on phone: Not on file    Gets together: Not on file    Attends religious service: Not on file    Active member of club or organization: Not on file    Attends meetings of clubs or organizations: Not on file    Relationship status: Not on file  Other Topics Concern  . Not on file  Social History Narrative  . Not on file     Family History:  The patient's family history includes Breast cancer (age of onset: 60) in his mother; CAD in his brother; Cervical cancer in his sister and sister; Prostate cancer (age of onset: 76) in his father.   ROS:   Please see the history of present illness.    ROS All other systems reviewed and are negative.   PHYSICAL EXAM:   VS:  BP 110/62   Pulse 75   Ht 6' (1.829 m)   Wt 214 lb (97.1 kg)   BMI 29.02 kg/m      General: Alert, oriented x3, no distress, slightly pale but appears comfortable. Head: no evidence  of trauma, PERRL, EOMI, no exophtalmos or lid lag, no myxedema, no xanthelasma; normal ears, nose and oropharynx Neck: normal jugular venous pulsations and no hepatojugular reflux; brisk carotid pulses without delay and no carotid bruits Chest: Sternotomy is healing very well without any evidence of hematoma or cellulitis; slightly reduced breath sounds and a few atelectatic crackles in the left base, otherwise lungs clear to auscultation, no signs of consolidation by percussion or palpation, normal fremitus, symmetrical and full respiratory excursions Cardiovascular: normal position and quality of the apical impulse, regular rhythm, normal first and second heart sounds, no murmurs, rubs or gallops Abdomen: no tenderness or distention, no masses by palpation, no abnormal pulsatility or arterial bruits, normal bowel sounds, no hepatosplenomegaly Extremities: The saphenectomy site is healing very nicely; no clubbing, cyanosis or edema; 2+ radial, ulnar and brachial pulses bilaterally; 2+ right femoral, posterior tibial and dorsalis pedis pulses; 2+ left femoral, posterior tibial and dorsalis pedis pulses; no subclavian or femoral bruits Neurological: grossly nonfocal Psych: Normal mood and affect    Wt Readings from Last 3 Encounters:  05/18/18 214 lb (97.1 kg)  05/07/18 220 lb 10.9 oz (100.1 kg)  04/26/18 227 lb (103 kg)      Studies/Labs Reviewed:   EKG:  EKG is  ordered today.  It shows normal sinus rhythm with first-degree AV block and left bundle branch block, which is new compared to preoperatively Lipid Panel 2018  triglycerides 120, total cholesterol 113, LDL 47, HDL 42. 2019 LDL cholesterol 69 Lipid Panel     Component Value Date/Time   CHOL 223 (H) 06/01/2015 1202   TRIG 258 (H) 06/01/2015 1202   HDL 45 06/01/2015 1202  CHOLHDL 5.0 06/01/2015 1202   VLDL 52 (H) 06/01/2015 1202   LDLCALC 126 (H) 06/01/2015 1202     ASSESSMENT:    1. Coronary artery disease of native  artery of native heart with stable angina pectoris ([place])   2. History of aortic valve replacement with bioprosthetic valve   3. Postoperative atrial fibrillation ([facility])   4. LBBB (left bundle branch block)   5. Essential hypertension   6. Hypercholesterolemia      PLAN:  In order of problems listed above:  1. [name]: Recovering very well after bypass surgery, not withstanding his issues with right arm pain and nausea.  Surgical sites are healing very quickly.  He has a scheduled appointment with his surgeon on February 10.  He did very well with cardiac rehab after his PCI in the past and I suspect this will help him recover both physically and psychologically.  He has no evidence of edema whatsoever lungs are pretty much clear and his weight has dropped substantially since hospital discharge.  We will stop his furosemide and the potassium supplement. 2. S/P AVR: He is currently anticoagulated with apixaban for the atrial fibrillation.  Plan a baseline echocardiogram in several more weeks, after his anemia has resolved.  No murmur heard on physical exam today. 3. AFib: In sinus rhythm today.  He is developed a fairly lengthy first-degree AV block as well as the postop left bundle branch block and I reduced his amiodarone to 200 mg once daily today.  If he does not have any recurrent atrial fibrillation between now and his follow-up visit with the surgeon on February 10 I would probably recommend stopping the amiodarone.  I told him that we will have to continue the Eliquis for at least a couple months longer, since he may have a late recurrence of atrial fibrillation and therefore risk for stroke.  I will see him back in about 2 more months to discuss whether or not he requires long-term anticoagulation. 4. LBBB: Occurred immediately postop and no evidence of higher grade AV block.  Talked about the fact that this makes his ECG nondiagnostic for ischemia, but has few other implications at this  time. 5. HTN: Excellent control. 6. HLP: Good LDL reduction on rosuvastatin.  I recommended Vascepa for proven improvement in cardiovascular outcomes.  This would cost him $95 a month and he is not sure he can afford it  Medication Adjustments/Labs and Tests Ordered: Current medicines are reviewed at length with the patient today.  Concerns regarding medicines are outlined above.  Medication changes, Labs and Tests ordered today are listed in the Patient Instructions below. Patient Instructions  Medication Instructions:  DECREASE AMIODARONE TO 200 MG ONCE DAILY  STOP FUROSEMIDE  STOP POTASSIUM If you need a refill on your cardiac medications before your next appointment, please call your pharmacy.   Lab work: If you have labs (blood work) drawn today and your tests are completely normal, you will receive your results only by: [name] MyChart Message (if you have MyChart) OR . A paper copy in the mail If you have any lab test that is abnormal or we need to change your treatment, we will call you to review the results.    Follow-Up: At [facility], you and your health needs are our priority.  As part of our continuing mission to provide you with exceptional heart care, we have created designated Provider Care Teams.  These Care Teams include your primary Cardiologist (physician) and Advanced  Practice Providers (APPs -  Physician Assistants and Nurse Practitioners) who all work together to provide you with the care you need, when you need it.  Your physician recommends that you schedule a follow-up appointment in: [place], [place] Edison Nicholson, MD  05/18/2018 3:00 PM    [address], [place], [place]  [address] Phone: [phone]; Fax: [phone]

## 2018-05-20 ENCOUNTER — Ambulatory Visit: Payer: PPO | Admitting: Physician Assistant

## 2018-05-23 ENCOUNTER — Telehealth: Payer: Self-pay | Admitting: Cardiovascular Disease

## 2018-05-23 NOTE — Telephone Encounter (Signed)
Patient called in states since cath and procedure he has continued to lose weight. He was 215 lbs around the 18th of January, and now he is 207. He states since the procedure he does not have any appetite, and has only been snacking. Patient denies SOB, Chest pain, or swelling. He does mention continued pain in his arm from IV's, but states it is getting better. I advised patient to try a boost, or an ensure in between meals to try to add some nutrients to hopefully increase his energy level. Patient states he will try this, but I notified patient I would make Dr.[name] aware if any other recommendations needed to be made.  Thank you!

## 2018-05-23 NOTE — Telephone Encounter (Signed)
It is possible that he is lost his appetite from the amiodarone.  I just reduced the dose at his office visit a few days ago.  It will take a little while for that change to have an effect.  I otherwise agree with your advice for the supplements. MCr

## 2018-05-23 NOTE — Telephone Encounter (Signed)
New Message    Wife calling to report husbands weight loss after surgery and wants to speak to a nurse.

## 2018-05-24 NOTE — Telephone Encounter (Signed)
Noted! Thank you

## 2018-05-27 ENCOUNTER — Other Ambulatory Visit: Payer: Self-pay | Admitting: Cardiothoracic Surgery

## 2018-05-27 DIAGNOSIS — Z951 Presence of aortocoronary bypass graft: Secondary | ICD-10-CM

## 2018-05-27 NOTE — Telephone Encounter (Signed)
Lm for pt to call back to make an appt ./cy

## 2018-05-28 NOTE — Progress Notes (Signed)
Cardiology Office Note   Date:  05/30/2018   ID:  Kia, Stavros [birth year], MRN [id number]  PCP:  Fanny Bien, MD  Cardiologist:  [name]  Chief Complaint  Patient presents with  . Coronary Artery Disease  . Atrial Fibrillation     History of Present Illness: Lawrence Hall is a 73 y.o. male who presents for ongoing assessment and management of CAD, with h/o NSTEMI in 2017 as a result of a high grade stenosis in the proximal left coronary artery, requiring a Resolute DES; repeat cath in 04/2018 revealed LM, and RCA stenosis, which subsequently lead to CABG X 3 (LIMA to LAD, SVG to OM, and SVG to RCA) with AoV replacement with a 25 mm Edwards pericardial valve by Dr. Nils Pyle, 0/38/8828, with complications of atrial fib post operatively. Other history includes hyperlipidemia, HTN,   On last office visit with Dr. Sallyanne Kuster on 05/18/2018, he reported that he had a lot of pain in his right arm, caused by vein irritation from IV amiodarone.  He admitted to taking a lot of oxycodone and tylenol. EKG was documented to have LBBB and 1st degree AV block.   Lasix and potassium were discontinued. He was continued on apixaban. He was to have repeat echo after post operative anemia was resolved. He was noted to be in NSR. Amiodarone was reduced to 200 mg daily.  Consideration for stopping amiodarone on this office visit. He was to continue anticoagulation for a total of 3 months.   He comes today with multiple questions and symptoms. He states that he stopped the amiodarone for 2 days and then accidentally restarted it, by getting it mixed up with the metoprolol that he takes BID.  He had an episode of racing HR shortly after taking his walk, and once after getting up from the couch where he had been lying watching TV for a couple of hours. He did some deep breathing and the HR finally settled down after about 15-20 minutes. When he realized his mistake he started taking the medications as  directed,(amiodarone 200 mg in the am and metoprolol 50 mg BID)    He was worried about these two occurrences, but they have not returned since the last event 5 days ago.    He also asks about his EKG and what a LBBB means and why it may look like he is having a heart attack.  He was told this by one of his physicians. He continues to complain of arm pain and overall fatigue.    Past Medical History:  Diagnosis Date  . Arthritis    mild hands  . Asthma   . Cataract    early signs b/l eyes  . Dysrhythmia    PAC'S  . ED (erectile dysfunction)   . H/O hiatal hernia   . Hypercholesterolemia   . Hypertension    MARGINALLY HIGH - NO MEDS -MONITORS  . Prostate cancer ([place]) 03/27/2011   prostate bx=adenocarcinoma,  . Skin cancer   . Skin cancer 2008   basal cell face /removed  . Sleep apnea    USES NOSTRIL APARATUS - NO C-PAP- "BORDERLINE SLEEP APMNEA"    Past Surgical History:  Procedure Laterality Date  . AORTIC VALVE REPLACEMENT N/A 04/29/2018   Procedure: AORTIC VALVE REPLACEMENT (AVR) using INSPIRIS RESILIA  AORTIC VALVE 56mm;  Surgeon: Ivin Poot, MD;  Location: [place];  Service: Open Heart Surgery;  Laterality: N/A;  . basal cell cancer  2008  inside nose  . CARDIAC CATHETERIZATION N/A 06/03/2015   Procedure: Left Heart Cath and Coronary Angiography;  Surgeon: [name] Sine, MD;  Location: [place] [facility];  Service: Cardiovascular;  Laterality: N/A;  . CORONARY ARTERY BYPASS GRAFT N/A 04/29/2018   Procedure: CORONARY ARTERY BYPASS GRAFTING (CABG) X 3; Using Left Internal Mammary Artery (LIMA) and Right Leg Greater Saphenous Vein Graft (SVG);  LIMA to LAD, SVG to OM1, SVG to RCA,;  Surgeon: Ivin Poot, MD;  Location: [place];  Service: Open Heart Surgery;  Laterality: N/A;  . RIGHT/LEFT HEART CATH AND CORONARY ANGIOGRAPHY N/A 04/26/2018   Procedure: RIGHT/LEFT HEART CATH AND CORONARY ANGIOGRAPHY;  Surgeon: [name] Sine, MD;  Location: [place] [facility];  Service:  Cardiovascular;  Laterality: N/A;  . ROBOT ASSISTED LAPAROSCOPIC RADICAL PROSTATECTOMY  08/10/2011   Procedure: ROBOTIC ASSISTED LAPAROSCOPIC RADICAL PROSTATECTOMY LEVEL 2;  Surgeon: Dutch Gray, MD;  [facility];  Service: Urology;  Laterality: N/A;  . TEE WITHOUT CARDIOVERSION N/A 04/29/2018   Procedure: TRANSESOPHAGEAL ECHOCARDIOGRAM (TEE);  Surgeon: Prescott Gum, Collier Salina, MD;  Location: [place];  Service: Open Heart Surgery;  Laterality: N/A;  . THYROID SURGERY  age [age]     Current Outpatient Medications  Medication Sig Dispense Refill  . amiodarone (PACERONE) 200 MG tablet Take 1 tablet (200 mg total) by mouth daily. 60 tablet 1  . apixaban (ELIQUIS) 5 MG TABS tablet Take 1 tablet (5 mg total) by mouth 2 (two) times daily. 60 tablet 1  . aspirin EC 325 MG EC tablet Take 1 tablet (325 mg total) by mouth daily.    [name] atorvastatin (LIPITOR) 80 MG tablet Take 1 tablet (80 mg total) by mouth daily at 6 PM. 30 tablet 1  . chlorhexidine (PERIDEX) 0.12 % solution Rinse with 15 mls twice daily for 30 seconds. Use after breakfast and at bedtime. Spit out excess. Do not swallow. 960 mL 5  . Cholecalciferol (VITAMIN D-3) 125 MCG (5000 UT) TABS Take 5,000 Units by mouth daily.    . Coenzyme Q10 (COQ10) 200 MG CAPS Take 200 mg by mouth daily.    . irbesartan (AVAPRO) 75 MG tablet Take 1 tablet (75 mg total) by mouth daily. 30 tablet 1  . MEGARED OMEGA-3 KRILL OIL 500 MG CAPS Take 1 capsule by mouth daily.     . ondansetron (ZOFRAN) 4 MG tablet Take 1 tablet (4 mg total) by mouth every 8 (eight) hours as needed for nausea or vomiting. 20 tablet 0  . oxyCODONE (OXY IR/ROXICODONE) 5 MG immediate release tablet Take 5 mg by mouth as needed for severe pain.    . vitamin E 400 UNIT capsule Take 400 Units by mouth daily.    . metoprolol succinate (TOPROL-XL) 100 MG 24 hr tablet Take 1 tablet (100 mg total) by mouth at bedtime. Take with or immediately following a meal. 30 tablet 3   No current  facility-administered medications for this visit.     Allergies:   Other    Social History:  The patient  reports that he has never smoked. He has never used smokeless tobacco. He reports current alcohol use. He reports that he does not use drugs.   Family History:  The patient's family history includes Breast cancer (age of onset: 107) in his mother; CAD in his brother; Cervical cancer in his sister and sister; Prostate cancer (age of onset: 77) in his father.    ROS: All other systems are reviewed and negative. Unless  otherwise mentioned in H&P    PHYSICAL EXAM: VS:  BP (!) 104/58   Pulse 79   Ht 6' (1.829 m)   Wt 206 lb 9.6 oz (93.7 kg)   BMI 28.02 kg/m  , BMI Body mass index is 28.02 kg/m. GEN: Well nourished, well developed, in no acute distress HEENT: normal Neck: no JVD, carotid bruits, or masses Cardiac: RRR; distant heart sounds. no murmurs, rubs, or gallops,no edema  Respiratory:  Clear to auscultation bilaterally, normal work of breathing GI: soft, nontender, nondistended, + BS MS: no deformity or atrophy Skin: warm and dry, no rash Neuro:  Strength and sensation are intact Psych: euthymic mood, full affect   EKG:   NSR rate of 79 bpm. LBBB.  Recent Labs: 04/28/2018: ALT 16; TSH 5.037 05/02/2018: Magnesium 2.7 05/05/2018: BUN 28; Creatinine, Ser 1.18; Potassium 4.2; Sodium 140 05/07/2018: Hemoglobin 10.3; Platelets 292    Lipid Panel    Component Value Date/Time   CHOL 223 (H) 06/01/2015 1202   TRIG 258 (H) 06/01/2015 1202   HDL 45 06/01/2015 1202   CHOLHDL 5.0 06/01/2015 1202   VLDL 52 (H) 06/01/2015 1202   LDLCALC 126 (H) 06/01/2015 1202      Wt Readings from Last 3 Encounters:  05/30/18 206 lb 9.6 oz (93.7 kg)  05/18/18 214 lb (97.1 kg)  05/07/18 220 lb 10.9 oz (100.1 kg)      Other studies Reviewed: RHC and LHC 04/26/2018 Conclusion     Prox LAD lesion is 50% stenosed.  Mid LM to Dist LM lesion is 70% stenosed.  Previously placed Prox Cx  stent (unknown type) is widely patent.  Prox RCA to Mid RCA lesion is 75% stenosed.   Multivessel CAD with smooth eccentric 70% distal left main stenosis; 50% proximal LAD stenosis; patent left circumflex stent; and 75% proximal RCA stenosis.  Normal right heart pressures.    TEE 04/29/2018 Result status: Final result   Septum: Small Patent Foramen Ovale present with left to right shunt.  Left atrium: Patent foramen ovale present with left to right shunting.  Aortic valve: The valve is probable trileaflet. Severe valve thickening present. Severe valve calcification present. Severely decreased leaflet separation. Moderate to severe stenosis. Trace regurgitation.  Mitral valve: Trace regurgitation.  Right ventricle: Normal cavity size, wall thickness and ejection fraction.     ASSESSMENT AND PLAN:  1. CAD: S/P CABG overall doing well but continues to complain of fatigue. He states that he has no appetite and tires easily when he exerts himself but rests and goes back to it. He rakes his yard and goes for walks. I will change the metoprolol tartrate 50 mg BID to metoprolol succinate 100 mg at HS. Hopefully this will help with fatigue.   2. Atrial fib: He is in NSR with LBBB today. He remains on amiodarone 200 mg daily. There was a mix up in his medications resulting in a rapid HR which occurred after exercise, and after lying down for 2 hours and getting up.  I will leave him on the amiodarone at 200 mg for now. He will also continue on apixaban.  I will check TSH, T3 and T4 as TSH was mildly elevated on 04/28/2018, and T4 was decreased at .74. May decrease amiodarone if remain abnormal.   3. AoV Replacement: No complaints of pain or DOE. Doing well.   3. LBBB: I have explained this to him and his wife. I have answered multiple questions.   4. Right arm pain: He  continues to complain of right arm pain, but states he is no longer taking oxycodone, only tylenol.   5. Hypertension Low  normal. May be causing some of his fatigue. Will follow this. Decrease amiodarone possibly vs decreasing irbesartan.   Current medicines are reviewed at length with the patient today.  Multiple questions have been answered. I spent greater than 40 minutes with this patient and his wife in direct patient interaction.   Labs/ tests ordered today include: TSH. T3 and T4.   Phill Myron. West Pugh, ANP, [facility]   05/30/2018 11:24 AM    [facility] [address] Office [phone] Fax [phone]

## 2018-05-30 ENCOUNTER — Ambulatory Visit: Payer: PPO | Admitting: Adult Health

## 2018-05-30 ENCOUNTER — Encounter: Payer: Self-pay | Admitting: Adult Health

## 2018-05-30 ENCOUNTER — Ambulatory Visit
Admission: RE | Admit: 2018-05-30 | Discharge: 2018-05-30 | Disposition: A | Discharge status: Home / Self Care | Payer: PPO | Source: Ambulatory Visit | Attending: Cardiothoracic Surgery | Admitting: Cardiothoracic Surgery

## 2018-05-30 ENCOUNTER — Ambulatory Visit (INDEPENDENT_AMBULATORY_CARE_PROVIDER_SITE_OTHER): Payer: Self-pay | Admitting: Physician Assistant

## 2018-05-30 ENCOUNTER — Other Ambulatory Visit: Payer: Self-pay

## 2018-05-30 ENCOUNTER — Encounter: Payer: Self-pay | Admitting: Physician Assistant

## 2018-05-30 VITALS — BP 106/65 | HR 70 | Resp 16 | Ht 72.0 in | Wt 206.0 lb

## 2018-05-30 VITALS — BP 104/58 | HR 79 | Ht 72.0 in | Wt 206.6 lb

## 2018-05-30 DIAGNOSIS — Z79899 Other long term (current) drug therapy: Secondary | ICD-10-CM

## 2018-05-30 DIAGNOSIS — J90 Pleural effusion, not elsewhere classified: Secondary | ICD-10-CM | POA: Diagnosis not present

## 2018-05-30 DIAGNOSIS — J9811 Atelectasis: Secondary | ICD-10-CM | POA: Diagnosis not present

## 2018-05-30 DIAGNOSIS — I2510 Atherosclerotic heart disease of native coronary artery without angina pectoris: Secondary | ICD-10-CM

## 2018-05-30 DIAGNOSIS — I350 Nonrheumatic aortic (valve) stenosis: Secondary | ICD-10-CM

## 2018-05-30 DIAGNOSIS — Z951 Presence of aortocoronary bypass graft: Secondary | ICD-10-CM

## 2018-05-30 DIAGNOSIS — I10 Essential (primary) hypertension: Secondary | ICD-10-CM

## 2018-05-30 DIAGNOSIS — Z953 Presence of xenogenic heart valve: Secondary | ICD-10-CM

## 2018-05-30 MED ORDER — METOPROLOL SUCCINATE ER 100 MG PO TB24: 100.0000 mg | ORAL_TABLET | Freq: Every day | ORAL | 3 refills | Status: DC

## 2018-05-30 NOTE — Progress Notes (Signed)
HPI:  Patient returns for routine postoperative follow-up having undergone CABG x 3 and AVR on 04/29/2018.  The patient's early postoperative recovery while in the hospital was notable for Atrial Fibrillation which converted with Amiodarone.  Since hospital discharge the patient reports he is doing better.  It has been a slow recovery.  He states that his appetite has been very poor since hospital discharge.  He does mention that this has been improving over the past several days, so he is hoping this will resolve soon.  He also is experiencing some fatigue and weakness which he attributes to lack of appetite.  He states his lopressor dose has also been adjusted to night time dosing which may help with this.  He is up and doing work and walking about 30-45 min per day.  His incisions healing without evidence of infection.   Current Outpatient Medications  Medication Sig Dispense Refill  . amiodarone (PACERONE) 200 MG tablet Take 1 tablet (200 mg total) by mouth daily. 60 tablet 1  . apixaban (ELIQUIS) 5 MG TABS tablet Take 1 tablet (5 mg total) by mouth 2 (two) times daily. 60 tablet 1  . aspirin EC 325 MG EC tablet Take 1 tablet (325 mg total) by mouth daily.    [name] atorvastatin (LIPITOR) 80 MG tablet Take 1 tablet (80 mg total) by mouth daily at 6 PM. 30 tablet 1  . chlorhexidine (PERIDEX) 0.12 % solution Rinse with 15 mls twice daily for 30 seconds. Use after breakfast and at bedtime. Spit out excess. Do not swallow. 960 mL 5  . Cholecalciferol (VITAMIN D-3) 125 MCG (5000 UT) TABS Take 5,000 Units by mouth daily.    . Coenzyme Q10 (COQ10) 200 MG CAPS Take 200 mg by mouth daily.    . irbesartan (AVAPRO) 75 MG tablet Take 1 tablet (75 mg total) by mouth daily. 30 tablet 1  . MEGARED OMEGA-3 KRILL OIL 500 MG CAPS Take 1 capsule by mouth daily.     . metoprolol succinate (TOPROL-XL) 100 MG 24 hr tablet Take 1 tablet (100 mg total) by mouth at bedtime. Take with or immediately following a meal. 30 tablet  3  . ondansetron (ZOFRAN) 4 MG tablet Take 1 tablet (4 mg total) by mouth every 8 (eight) hours as needed for nausea or vomiting. 20 tablet 0  . oxyCODONE (OXY IR/ROXICODONE) 5 MG immediate release tablet Take 5 mg by mouth as needed for severe pain.    . vitamin E 400 UNIT capsule Take 400 Units by mouth daily.     No current facility-administered medications for this visit.     Physical Exam:  BP 106/65 (BP Location: Right Arm, Patient Position: Sitting, Cuff Size: Large)   Pulse 70   Resp 16   Ht 6' (1.829 m)   Wt 206 lb (93.4 kg)   SpO2 97% Comment: ON RA  BMI 27.94 kg/m   Gen: no apparent distress Heart: RRR Lungs: CTA bilaterally Ext: no edema Skin: incisions well healed  Diagnostic Tests:  CXR: no significant effusion, some atelectasis present  A/P;  1. S/P CABG x 3/AVR- patient is making good progress.  He is okay to start cardiac rehab which should help improve his overall endurance.  Lopressor dose adjusted by Cardiology this morning 2. Atrial Fibrillation- maintaining NSR- weaning of Amiodarone per Cardiology 3. Activity- okay to start driving, continue to increase activity and walking as tolerated, continue to refrain from lifting more than 10-15 lbs over the next several weeks.[name]  Endocarditis prophylaxis 4. Appetite- improving, patient given instructions to eat what he can and supplement with protein shakes throughout the day until appetite returns 5. RTC in 3 months with PVT   Ellwood Handler, PA-C [facility] [phone]

## 2018-05-30 NOTE — Patient Instructions (Signed)
Medication Instructions:  STOP METOPROLOL TARTRATE START METOPROLOL SUCCINATE 100MG  AT BEDTIME CONTINUE AMIODARONE If you need a refill on your cardiac medications before your next appointment, please call your pharmacy.  Labwork: TSH, T3 AND T4 TODASY HERE IN OUR OFFICE AT [facility]   Take the provided lab slips with you to the lab for your blood draw.   When you have your labs (blood work) drawn today and your tests are completely normal, you will receive your results only by MyChart Message (if you have MyChart) -OR-  A paper copy in the mail.  If you have any lab test that is abnormal or we need to change your treatment, we will call you to review these results.  Follow-Up: You will need a follow up appointment in [place] [name], MD or one of the following Advanced Practice Providers on your designated Care Team:  Almyra Deforest, [place]  Fabian Sharp, PA-C At [facility], you and your health needs are our priority.  As part of our continuing mission to provide you with exceptional heart care, we have created designated Provider Care Teams.  These Care Teams include your primary Cardiologist (physician) and Advanced Practice Providers (APPs -  Physician Assistants and Nurse Practitioners) who all work together to provide you with the care you need, when you need it.  Thank you for choosing [facility] at [facility]!!

## 2018-05-31 LAB — TSH: TSH: 4.12 u[IU]/mL (ref 0.450–4.500)

## 2018-05-31 LAB — T3: T3, Total: 82 ng/dL (ref 71–180)

## 2018-05-31 LAB — T4: T4, Total: 9.2 ug/dL (ref 4.5–12.0)

## 2018-05-31 NOTE — Telephone Encounter (Signed)
Attempted to call patient in regards to Cardiac Rehab - LM on VM 

## 2018-05-31 NOTE — Progress Notes (Signed)
Thanks, Erin 

## 2018-06-07 NOTE — Telephone Encounter (Signed)
Pt came in and he was interested in participating in the Cardiac Rehab Program. Patient stated yes. Patient will come in for orientation on 06/28/2018 @ 8:15am and will attend the 2:45pm exercise class.  Mailed homework package. [name]. Support Rep II

## 2018-06-12 ENCOUNTER — Encounter: Payer: Self-pay | Admitting: Cardiothoracic Surgery

## 2018-06-14 ENCOUNTER — Telehealth (HOSPITAL_COMMUNITY): Payer: Self-pay | Admitting: Pharmacist

## 2018-06-22 ENCOUNTER — Ambulatory Visit (INDEPENDENT_AMBULATORY_CARE_PROVIDER_SITE_OTHER): Payer: Self-pay | Admitting: Cardiothoracic Surgery

## 2018-06-22 ENCOUNTER — Other Ambulatory Visit: Payer: Self-pay

## 2018-06-22 ENCOUNTER — Encounter: Payer: Self-pay | Admitting: Cardiothoracic Surgery

## 2018-06-22 VITALS — BP 110/67 | HR 64 | Resp 16 | Ht 72.0 in | Wt 205.0 lb

## 2018-06-22 DIAGNOSIS — I2510 Atherosclerotic heart disease of native coronary artery without angina pectoris: Secondary | ICD-10-CM

## 2018-06-22 DIAGNOSIS — Z952 Presence of prosthetic heart valve: Secondary | ICD-10-CM

## 2018-06-22 DIAGNOSIS — I350 Nonrheumatic aortic (valve) stenosis: Secondary | ICD-10-CM

## 2018-06-22 DIAGNOSIS — Z951 Presence of aortocoronary bypass graft: Secondary | ICD-10-CM

## 2018-06-22 NOTE — Progress Notes (Signed)
PCP is Fanny Bien, MD Referring Provider is [name], Dani Gobble, MD  Chief Complaint  Patient presents with  . Routine Post Op    3 week f/u HX of CABG x 3/AVR.Marland KitchenMarland Kitchen1/10/20...saw  PA  05/30/18    HPI: Patient doing well 2 months after AVR CABG x3 Maintaining normal sinus rhythm since discharge.  He will stop his amiodarone and Eliquis when his current prescription runs out He is walking 30 minutes a day without recurrent symptoms of angina or CHF.  Surgical incisions are healing well.  Last postop x-ray is clear. Patient scheduled to start cardiac rehab at hospital next week.   Past Medical History:  Diagnosis Date  . Arthritis    mild hands  . Asthma   . Cataract    early signs b/l eyes  . Dysrhythmia    PAC'S  . ED (erectile dysfunction)   . H/O hiatal hernia   . Hypercholesterolemia   . Hypertension    MARGINALLY HIGH - NO MEDS -MONITORS  . Prostate cancer ([place]) 03/27/2011   prostate bx=adenocarcinoma,  . Skin cancer   . Skin cancer 2008   basal cell face /removed  . Sleep apnea    USES NOSTRIL APARATUS - NO C-PAP- "BORDERLINE SLEEP APMNEA"    Past Surgical History:  Procedure Laterality Date  . AORTIC VALVE REPLACEMENT N/A 04/29/2018   Procedure: AORTIC VALVE REPLACEMENT (AVR) using INSPIRIS RESILIA  AORTIC VALVE 24mm;  Surgeon: Ivin Poot, MD;  Location: [place];  Service: Open Heart Surgery;  Laterality: N/A;  . basal cell cancer  2008   inside nose  . CARDIAC CATHETERIZATION N/A 06/03/2015   Procedure: Left Heart Cath and Coronary Angiography;  Surgeon: [name] Sine, MD;  Location: [place] [facility];  Service: Cardiovascular;  Laterality: N/A;  . CORONARY ARTERY BYPASS GRAFT N/A 04/29/2018   Procedure: CORONARY ARTERY BYPASS GRAFTING (CABG) X 3; Using Left Internal Mammary Artery (LIMA) and Right Leg Greater Saphenous Vein Graft (SVG);  LIMA to LAD, SVG to OM1, SVG to RCA,;  Surgeon: Ivin Poot, MD;  Location: [place];  Service: Open Heart Surgery;   Laterality: N/A;  . RIGHT/LEFT HEART CATH AND CORONARY ANGIOGRAPHY N/A 04/26/2018   Procedure: RIGHT/LEFT HEART CATH AND CORONARY ANGIOGRAPHY;  Surgeon: [name] Sine, MD;  Location: [place] [facility];  Service: Cardiovascular;  Laterality: N/A;  . ROBOT ASSISTED LAPAROSCOPIC RADICAL PROSTATECTOMY  08/10/2011   Procedure: ROBOTIC ASSISTED LAPAROSCOPIC RADICAL PROSTATECTOMY LEVEL 2;  Surgeon: Dutch Gray, MD;  [facility];  Service: Urology;  Laterality: N/A;  . TEE WITHOUT CARDIOVERSION N/A 04/29/2018   Procedure: TRANSESOPHAGEAL ECHOCARDIOGRAM (TEE);  Surgeon: Prescott Gum, Collier Salina, MD;  Location: [place];  Service: Open Heart Surgery;  Laterality: N/A;  . THYROID SURGERY  age 65    Family History  Problem Relation Age of Onset  . Prostate cancer Father 108       in his 70's/other comorbidities/79 deceased  . Breast cancer Mother 5       mets to bone,deceased age 62  . Cervical cancer Sister        2 sisters  . Cervical cancer Sister   . CAD Brother        Died of sudden cardiac death/MI    Social History Social History   Tobacco Use  . Smoking status: Never Smoker  . Smokeless tobacco: Never Used  Substance Use Topics  . Alcohol use: Yes    Comment: Occasional  . Drug use:  No    Current Outpatient Medications  Medication Sig Dispense Refill  . amiodarone (PACERONE) 200 MG tablet Take 1 tablet (200 mg total) by mouth daily. 60 tablet 1  . apixaban (ELIQUIS) 5 MG TABS tablet Take 1 tablet (5 mg total) by mouth 2 (two) times daily. 60 tablet 1  . aspirin EC 325 MG EC tablet Take 1 tablet (325 mg total) by mouth daily.    [name] atorvastatin (LIPITOR) 80 MG tablet Take 1 tablet (80 mg total) by mouth daily at 6 PM. 30 tablet 1  . chlorhexidine (PERIDEX) 0.12 % solution Rinse with 15 mls twice daily for 30 seconds. Use after breakfast and at bedtime. Spit out excess. Do not swallow. 960 mL 5  . Cholecalciferol (VITAMIN D-3) 125 MCG (5000 UT) TABS Take 5,000 Units by mouth daily.    .  Coenzyme Q10 (COQ10) 200 MG CAPS Take 200 mg by mouth daily.    . irbesartan (AVAPRO) 75 MG tablet Take 1 tablet (75 mg total) by mouth daily. 30 tablet 1  . MEGARED OMEGA-3 KRILL OIL 500 MG CAPS Take 1 capsule by mouth daily.     . metoprolol succinate (TOPROL-XL) 100 MG 24 hr tablet Take 1 tablet (100 mg total) by mouth at bedtime. Take with or immediately following a meal. 30 tablet 3  . vitamin E 400 UNIT capsule Take 400 Units by mouth daily.     No current facility-administered medications for this visit.     Allergies  Allergen Reactions  . Other Itching and Other (See Comments)    Pet dander, seasonal allergies = Runny nose, itchy eyes    Review of Systems  Improved appetite Improved strength Feels great  BP 110/67 (BP Location: Left Arm, Patient Position: Sitting, Cuff Size: Large)   Pulse 64   Resp 16   Ht 6' (1.829 m)   Wt 205 lb (93 kg)   SpO2 96% Comment: RA  BMI 27.80 kg/m  Physical Exam      Exam    General- alert and comfortable    Neck- no JVD, no cervical adenopathy palpable, no carotid bruit   Lungs- clear without rales, wheezes   Cor- regular rate and rhythm, no murmur , gallop   Abdomen- soft, non-tender   Extremities - warm, non-tender, minimal edema   Neuro- oriented, appropriate, no focal weakness   Diagnostic Tests: None  Impression: Excellent 22-month recovery after AVR CABG x3  Plan: Patient should not lift more than 20 pounds or do heavy yard work until after mid April.  He will discontinue the amiodarone and Eliquis when the prescription runs out. We discussed the importance of dental prophylaxis to prevent endocarditis of the prosthetic valve.  We discussed the importance of heart healthy lifestyle including diet and activity goals.  He will return here as needed.  Len Childs, MD [facility] [phone]

## 2018-06-22 NOTE — Progress Notes (Signed)
Lawrence Hall 73 y.o. male DOB [birth year] MRN [id number]       Nutrition Screen Note  No diagnosis found. Past Medical History:  Diagnosis Date  . Arthritis    mild hands  . Asthma   . Cataract    early signs b/l eyes  . Dysrhythmia    PAC'S  . ED (erectile dysfunction)   . H/O hiatal hernia   . Hypercholesterolemia   . Hypertension    MARGINALLY HIGH - NO MEDS -MONITORS  . Prostate cancer ([place]) 03/27/2011   prostate bx=adenocarcinoma,  . Skin cancer   . Skin cancer 2008   basal cell face /removed  . Sleep apnea    USES NOSTRIL APARATUS - NO C-PAP- "BORDERLINE SLEEP APMNEA"   Meds reviewed.     Current Outpatient Medications (Cardiovascular):  .  amiodarone (PACERONE) 200 MG tablet, Take 1 tablet (200 mg total) by mouth daily. [name]  atorvastatin (LIPITOR) 80 MG tablet, Take 1 tablet (80 mg total) by mouth daily at 6 PM. .  irbesartan (AVAPRO) 75 MG tablet, Take 1 tablet (75 mg total) by mouth daily. .  metoprolol succinate (TOPROL-XL) 100 MG 24 hr tablet, Take 1 tablet (100 mg total) by mouth at bedtime. Take with or immediately following a meal.   Current Outpatient Medications (Analgesics):  .  aspirin EC 325 MG EC tablet, Take 1 tablet (325 mg total) by mouth daily. [name]  oxyCODONE (OXY IR/ROXICODONE) 5 MG immediate release tablet, Take 5 mg by mouth as needed for severe pain.  Current Outpatient Medications (Hematological):  .  apixaban (ELIQUIS) 5 MG TABS tablet, Take 1 tablet (5 mg total) by mouth 2 (two) times daily.  Current Outpatient Medications (Other):  .  chlorhexidine (PERIDEX) 0.12 % solution, Rinse with 15 mls twice daily for 30 seconds. Use after breakfast and at bedtime. Spit out excess. Do not swallow. .  Cholecalciferol (VITAMIN D-3) 125 MCG (5000 UT) TABS, Take 5,000 Units by mouth daily. .  Coenzyme Q10 (COQ10) 200 MG CAPS, Take 200 mg by mouth daily. [name]  MEGARED OMEGA-3 KRILL OIL 500 MG CAPS, Take 1 capsule by mouth daily.  .  ondansetron (ZOFRAN) 4 MG  tablet, Take 1 tablet (4 mg total) by mouth every 8 (eight) hours as needed for nausea or vomiting. .  vitamin E 400 UNIT capsule, Take 400 Units by mouth daily.   HT: Ht Readings from Last 1 Encounters:  05/30/18 6' (1.829 m)    WT: Wt Readings from Last 5 Encounters:  05/30/18 206 lb (93.4 kg)  05/30/18 206 lb 9.6 oz (93.7 kg)  05/18/18 214 lb (97.1 kg)  05/07/18 220 lb 10.9 oz (100.1 kg)  04/26/18 227 lb (103 kg)     BMI = 27.93   Current tobacco use? No       Labs:  Lipid Panel     Component Value Date/Time   CHOL 223 (H) 06/01/2015 1202   TRIG 258 (H) 06/01/2015 1202   HDL 45 06/01/2015 1202   CHOLHDL 5.0 06/01/2015 1202   VLDL 52 (H) 06/01/2015 1202   LDLCALC 126 (H) 06/01/2015 1202    Lab Results  Component Value Date   HGBA1C 5.4 04/28/2018   CBG (last 3)  No results for input(s): GLUCAP in the last 72 hours.  Nutrition Diagnosis ? Food-and nutrition-related knowledge deficit related to lack of exposure to information as related to diagnosis of: ? CVD   Nutrition Goal(s):  ? To be determined  Plan:  Pt to attend nutrition classes ? Nutrition I ? Nutrition II ? Portion Distortion  Will provide client-centered nutrition education as part of interdisciplinary care.   Monitor and evaluate progress toward nutrition goal with team.  Laurina Bustle, MS, RD, LDN 06/22/2018 11:02 AM

## 2018-06-24 ENCOUNTER — Telehealth (HOSPITAL_COMMUNITY): Payer: Self-pay | Admitting: *Deleted

## 2018-06-28 ENCOUNTER — Inpatient Hospital Stay (HOSPITAL_COMMUNITY)
Admission: RE | Admit: 2018-06-28 | Discharge: 2018-06-28 | Disposition: A | Discharge status: Home / Self Care | Payer: PPO | Source: Ambulatory Visit

## 2018-07-04 ENCOUNTER — Other Ambulatory Visit: Payer: Self-pay | Admitting: Surgical

## 2018-07-04 ENCOUNTER — Ambulatory Visit (HOSPITAL_COMMUNITY): Payer: PPO

## 2018-07-04 ENCOUNTER — Other Ambulatory Visit: Payer: Self-pay | Admitting: *Deleted

## 2018-07-04 MED ORDER — APIXABAN 5 MG PO TABS: 5.0000 mg | ORAL_TABLET | Freq: Two times a day (BID) | ORAL | 3 refills | Status: DC

## 2018-07-04 MED ORDER — IRBESARTAN 75 MG PO TABS: 75.0000 mg | ORAL_TABLET | Freq: Every day | ORAL | 3 refills | Status: DC

## 2018-07-06 ENCOUNTER — Ambulatory Visit (HOSPITAL_COMMUNITY): Payer: PPO

## 2018-07-08 ENCOUNTER — Ambulatory Visit (HOSPITAL_COMMUNITY): Payer: PPO

## 2018-07-11 ENCOUNTER — Other Ambulatory Visit: Payer: Self-pay | Admitting: *Deleted

## 2018-07-11 ENCOUNTER — Ambulatory Visit (HOSPITAL_COMMUNITY): Payer: PPO

## 2018-07-11 MED ORDER — ATORVASTATIN CALCIUM 80 MG PO TABS: 80.0000 mg | ORAL_TABLET | Freq: Every day | ORAL | 3 refills | Status: DC

## 2018-07-13 ENCOUNTER — Ambulatory Visit (HOSPITAL_COMMUNITY): Payer: PPO

## 2018-07-15 ENCOUNTER — Ambulatory Visit (HOSPITAL_COMMUNITY): Payer: PPO

## 2018-07-18 ENCOUNTER — Ambulatory Visit (HOSPITAL_COMMUNITY): Payer: PPO

## 2018-07-20 ENCOUNTER — Ambulatory Visit (HOSPITAL_COMMUNITY): Payer: PPO

## 2018-07-22 ENCOUNTER — Ambulatory Visit (HOSPITAL_COMMUNITY): Payer: PPO

## 2018-07-25 ENCOUNTER — Ambulatory Visit (HOSPITAL_COMMUNITY): Payer: PPO

## 2018-07-27 ENCOUNTER — Ambulatory Visit (HOSPITAL_COMMUNITY): Payer: PPO

## 2018-07-29 ENCOUNTER — Ambulatory Visit (HOSPITAL_COMMUNITY): Payer: PPO

## 2018-08-01 ENCOUNTER — Ambulatory Visit (HOSPITAL_COMMUNITY): Payer: PPO

## 2018-08-03 ENCOUNTER — Ambulatory Visit (HOSPITAL_COMMUNITY): Payer: PPO

## 2018-08-05 ENCOUNTER — Ambulatory Visit (HOSPITAL_COMMUNITY): Payer: PPO

## 2018-08-08 ENCOUNTER — Ambulatory Visit (HOSPITAL_COMMUNITY): Payer: PPO

## 2018-08-10 ENCOUNTER — Ambulatory Visit (HOSPITAL_COMMUNITY): Payer: PPO

## 2018-08-12 ENCOUNTER — Ambulatory Visit (HOSPITAL_COMMUNITY): Payer: PPO

## 2018-08-15 ENCOUNTER — Ambulatory Visit (HOSPITAL_COMMUNITY): Payer: PPO

## 2018-08-16 ENCOUNTER — Telehealth: Payer: Self-pay | Admitting: Cardiovascular Disease

## 2018-08-16 NOTE — Telephone Encounter (Signed)
LVM for patient to call back with preference of appointment on 08-26-18.

## 2018-08-17 ENCOUNTER — Telehealth (HOSPITAL_COMMUNITY): Payer: Self-pay | Admitting: *Deleted

## 2018-08-17 ENCOUNTER — Ambulatory Visit (HOSPITAL_COMMUNITY): Payer: PPO

## 2018-08-17 NOTE — Telephone Encounter (Signed)
alled and spoke to pt regarding Cardiac Rehab referral and continued closure due to adherence of national recommendation for covid-19 in group setting. Pt verbalized understanding and remains interested in participating.  Pt is exercising by walking on trails and maintaining social distancing. No other needs identified. Cherre Huger, BSN Cardiac and Training and development officer

## 2018-08-19 ENCOUNTER — Ambulatory Visit (HOSPITAL_COMMUNITY): Payer: PPO

## 2018-08-22 ENCOUNTER — Ambulatory Visit (HOSPITAL_COMMUNITY): Payer: PPO

## 2018-08-24 ENCOUNTER — Ambulatory Visit (HOSPITAL_COMMUNITY): Payer: PPO

## 2018-08-25 ENCOUNTER — Telehealth: Payer: Self-pay | Admitting: Cardiovascular Disease

## 2018-08-26 ENCOUNTER — Telehealth (INDEPENDENT_AMBULATORY_CARE_PROVIDER_SITE_OTHER): Payer: PPO | Admitting: Cardiovascular Disease

## 2018-08-26 ENCOUNTER — Encounter: Payer: Self-pay | Admitting: Cardiovascular Disease

## 2018-08-26 ENCOUNTER — Ambulatory Visit (HOSPITAL_COMMUNITY): Payer: PPO

## 2018-08-26 VITALS — BP 126/76 | HR 71 | Ht 72.0 in | Wt 206.0 lb

## 2018-08-26 DIAGNOSIS — I10 Essential (primary) hypertension: Secondary | ICD-10-CM

## 2018-08-26 DIAGNOSIS — I4891 Unspecified atrial fibrillation: Secondary | ICD-10-CM | POA: Diagnosis not present

## 2018-08-26 DIAGNOSIS — I2510 Atherosclerotic heart disease of native coronary artery without angina pectoris: Secondary | ICD-10-CM | POA: Diagnosis not present

## 2018-08-26 DIAGNOSIS — Z951 Presence of aortocoronary bypass graft: Secondary | ICD-10-CM

## 2018-08-26 DIAGNOSIS — D649 Anemia, unspecified: Secondary | ICD-10-CM

## 2018-08-26 DIAGNOSIS — Z953 Presence of xenogenic heart valve: Secondary | ICD-10-CM

## 2018-08-26 DIAGNOSIS — Z952 Presence of prosthetic heart valve: Secondary | ICD-10-CM

## 2018-08-26 DIAGNOSIS — I447 Left bundle-branch block, unspecified: Secondary | ICD-10-CM

## 2018-08-26 DIAGNOSIS — I9789 Other postprocedural complications and disorders of the circulatory system, not elsewhere classified: Secondary | ICD-10-CM | POA: Diagnosis not present

## 2018-08-26 DIAGNOSIS — I25118 Atherosclerotic heart disease of native coronary artery with other forms of angina pectoris: Secondary | ICD-10-CM

## 2018-08-26 DIAGNOSIS — E7800 Pure hypercholesterolemia, unspecified: Secondary | ICD-10-CM | POA: Diagnosis not present

## 2018-08-26 DIAGNOSIS — I350 Nonrheumatic aortic (valve) stenosis: Secondary | ICD-10-CM

## 2018-08-26 DIAGNOSIS — C61 Malignant neoplasm of prostate: Secondary | ICD-10-CM

## 2018-08-26 MED ORDER — ASPIRIN 81 MG PO TBEC: 81.0000 mg | DELAYED_RELEASE_TABLET | Freq: Every day | ORAL | Status: DC

## 2018-08-26 MED ORDER — AMOXICILLIN 500 MG PO CAPS: ORAL_CAPSULE | ORAL | 5 refills | Status: DC

## 2018-08-26 NOTE — Patient Instructions (Addendum)
Medication Instructions:  Your physician has recommended you make the following change in your medication:  REDUCE Aspirin to 81mg  daily  If you need a refill on your cardiac medications before your next appointment, please call your pharmacy.   Lab work: Your physician recommends that you return for a FASTING lipid profile, cbc, psa  If you have labs (blood work) drawn today and your tests are completely normal, you will receive your results only by: [name] MyChart Message (if you have MyChart) OR . A paper copy in the mail If you have any lab test that is abnormal or we need to change your treatment, we will call you to review the results.  Testing/Procedures: Your physician has requested that you have an echocardiogram. Echocardiography is a painless test that uses sound waves to create images of your heart. It provides your doctor with information about the size and shape of your heart and how well your heart's chambers and valves are working. This procedure takes approximately one hour. There are no restrictions for this procedure. (To be schedule in 5-6 months)  Follow-Up: At [facility], you and your health needs are our priority.  As part of our continuing mission to provide you with exceptional heart care, we have created designated Provider Care Teams.  These Care Teams include your primary Cardiologist (physician) and Advanced Practice Providers (APPs -  Physician Assistants and Nurse Practitioners) who all work together to provide you with the care you need, when you need it. You will need a follow up appointment in December 2020 .  Please call our office 2 months in advance to schedule this appointment.  You may see Sanda Klein, MD or one of the following Advanced Practice Providers on your designated Care Team: [place], [place] . Fabian Sharp, PA-C  Any Other Special Instructions Will Be Listed Below (If Applicable).  Antibiotics are necessary before certain dental procedures to  reduce the likelihood of developing endocarditis, a serious infection of your heart valve prosthesis.   Prescription given for Amoxicillin 2 grams orally one hour before procedure.

## 2018-08-26 NOTE — Progress Notes (Signed)
Virtual Visit via Video Note   This visit type was conducted due to national recommendations for restrictions regarding the COVID-19 Pandemic (e.g. social distancing) in an effort to limit this patient's exposure and mitigate transmission in our community.  Due to his co-morbid illnesses, this patient is at least at moderate risk for complications without adequate follow up.  This format is felt to be most appropriate for this patient at this time.  All issues noted in this document were discussed and addressed.  A limited physical exam was performed with this format.  Please refer to the patient's chart for his consent to telehealth for [facility].   Date:  08/26/2018   ID:  Lawrence Hall, DOB 1946/02/12, MRN [id number]  Patient Location: Home Provider Location: Home  PCP:  Fanny Bien, MD  Cardiologist:  Sanda Klein, MD  Electrophysiologist:  None   Evaluation Performed:  Follow-Up Visit  Chief Complaint:  F/U CAD, AVR, HLP with  History of Present Illness:    Lawrence Hall is a 73 y.o. male with moderate hypercholesterolemia who underwent coronary bypass surgery and aortic valve replacement for multi vessel CAD and severe aortic stenosis on April 29, 2018 (Dr. Prescott Gum, LIMA to LAD, SVG to OM, SVG to RCA; 25 mm Inspiris Resilia AVR).  He had postoperative atrial fibrillation and took amiodarone and Eliquis for a while, without any arrhythmia recurrence.  He feels that he has fully recovered from the surgery.  He is walking 30 minutes a day every day.  He denies angina or dyspnea and has not been troubled with palpitations in months.  He denies leg edema, focal neurological complaints, intermittent claudication, orthopnea, PND, syncope.  He was never able to participate in cardiac rehab due to the coronavirus restrictions.  He wonders whether that would be of any benefit at this point.  He is carefully monitoring the coronavirus pandemic and following all appropriate  precautions.  The patient does not have symptoms concerning for COVID-19 infection (fever, chills, cough, or new shortness of breath).    Past Medical History:  Diagnosis Date  . Arthritis    mild hands  . Asthma   . Cataract    early signs b/l eyes  . Dysrhythmia    PAC'S  . ED (erectile dysfunction)   . H/O hiatal hernia   . Hypercholesterolemia   . Hypertension    MARGINALLY HIGH - NO MEDS -MONITORS  . Prostate cancer ([place]) 03/27/2011   prostate bx=adenocarcinoma,  . Skin cancer   . Skin cancer 2008   basal cell face /removed  . Sleep apnea    USES NOSTRIL APARATUS - NO C-PAP- "BORDERLINE SLEEP APMNEA"   Past Surgical History:  Procedure Laterality Date  . AORTIC VALVE REPLACEMENT N/A 04/29/2018   Procedure: AORTIC VALVE REPLACEMENT (AVR) using INSPIRIS RESILIA  AORTIC VALVE 15mm;  Surgeon: Ivin Poot, MD;  Location: [facility];  Service: Open Heart Surgery;  Laterality: N/A;  . basal cell cancer  2008   inside nose  . CARDIAC CATHETERIZATION N/A 06/03/2015   Procedure: Left Heart Cath and Coronary Angiography;  Surgeon: [name] Sine, MD;  Location: [facility];  Service: Cardiovascular;  Laterality: N/A;  . CORONARY ARTERY BYPASS GRAFT N/A 04/29/2018   Procedure: CORONARY ARTERY BYPASS GRAFTING (CABG) X 3; Using Left Internal Mammary Artery (LIMA) and Right Leg Greater Saphenous Vein Graft (SVG);  LIMA to LAD, SVG to OM1, SVG to RCA,;  Surgeon: Ivin Poot, MD;  Location: [facility];  Service: Open Heart Surgery;  Laterality: N/A;  . RIGHT/LEFT HEART CATH AND CORONARY ANGIOGRAPHY N/A 04/26/2018   Procedure: RIGHT/LEFT HEART CATH AND CORONARY ANGIOGRAPHY;  Surgeon: [name] Sine, MD;  Location: [facility];  Service: Cardiovascular;  Laterality: N/A;  . ROBOT ASSISTED LAPAROSCOPIC RADICAL PROSTATECTOMY  08/10/2011   Procedure: ROBOTIC ASSISTED LAPAROSCOPIC RADICAL PROSTATECTOMY LEVEL 2;  Surgeon: Dutch Gray, MD;  Location: [facility];  Service: Urology;  Laterality:  N/A;  . TEE WITHOUT CARDIOVERSION N/A 04/29/2018   Procedure: TRANSESOPHAGEAL ECHOCARDIOGRAM (TEE);  Surgeon: Prescott Gum, Collier Salina, MD;  Location: [facility];  Service: Open Heart Surgery;  Laterality: N/A;  . THYROID SURGERY  age [age]     Current Meds  Medication Sig  . aspirin EC 325 MG EC tablet Take 1 tablet (325 mg total) by mouth daily.  [name] atorvastatin (LIPITOR) 80 MG tablet Take 1 tablet (80 mg total) by mouth daily at 6 PM.  . chlorhexidine (PERIDEX) 0.12 % solution Rinse with 15 mls twice daily for 30 seconds. Use after breakfast and at bedtime. Spit out excess. Do not swallow.  . Cholecalciferol (VITAMIN D-3) 125 MCG (5000 UT) TABS Take 5,000 Units by mouth daily.  . Coenzyme Q10 (COQ10) 200 MG CAPS Take 200 mg by mouth daily.  . irbesartan (AVAPRO) 75 MG tablet Take 1 tablet (75 mg total) by mouth daily.  [name] MEGARED OMEGA-3 KRILL OIL 500 MG CAPS Take 1 capsule by mouth daily.   . metoprolol succinate (TOPROL-XL) 100 MG 24 hr tablet Take 1 tablet (100 mg total) by mouth at bedtime. Take with or immediately following a meal.  . vitamin E 400 UNIT capsule Take 400 Units by mouth daily.     Allergies:   Other   Social History   Tobacco Use  . Smoking status: Never Smoker  . Smokeless tobacco: Never Used  Substance Use Topics  . Alcohol use: Yes    Comment: Occasional  . Drug use: No     Family Hx: The patient's family history includes Breast cancer (age of onset: 21) in his mother; CAD in his brother; Cervical cancer in his sister and sister; Prostate cancer (age of onset: 51) in his father.  ROS:   Please see the history of present illness.     All other systems reviewed and are negative.   Prior CV studies:   The following studies were reviewed today:  Notes from follow-up with CV surgery  Labs/Other Tests and Data Reviewed:    EKG:  An ECG dated 05/30/2018 was personally reviewed today and demonstrated:  Sinus rhythm, left bundle branch block, left axis deviation   Recent Labs: 04/28/2018: ALT 16 05/02/2018: Magnesium 2.7 05/05/2018: BUN 28; Creatinine, Ser 1.18; Potassium 4.2; Sodium 140 05/07/2018: Hemoglobin 10.3; Platelets 292 05/30/2018: TSH 4.120   Recent Lipid Panel Lab Results  Component Value Date/Time   CHOL 223 (H) 06/01/2015 12:02 PM   TRIG 258 (H) 06/01/2015 12:02 PM   HDL 45 06/01/2015 12:02 PM   CHOLHDL 5.0 06/01/2015 12:02 PM   LDLCALC 126 (H) 06/01/2015 12:02 PM    Wt Readings from Last 3 Encounters:  08/26/18 206 lb (93.4 kg)  06/22/18 205 lb (93 kg)  05/30/18 206 lb (93.4 kg)     Objective:    Vital Signs:  BP 126/76   Pulse 71   Ht 6' (1.829 m)   Wt 206 lb (93.4 kg)   BMI 27.94 kg/m    VITAL SIGNS:  reviewed GEN:  no acute distress EYES:  sclerae anicteric, EOMI - Extraocular Movements Intact RESPIRATORY:  normal respiratory effort, symmetric expansion CARDIOVASCULAR:  no peripheral edema SKIN:  no rash, lesions or ulcers. MUSCULOSKELETAL:  no obvious deformities. NEURO:  alert and oriented x 3, no obvious focal deficit  PSYCH: Normal affect Mildly overweight  ASSESSMENT & PLAN:    1. [name]: Excellent recovery.  At this point I doubt will get any additional benefit from cardiac rehab.  Encouraged him to continue daily physical exercise and follow a healthy diet.  At this point he can reduce the aspirin to 81 mg daily.  He was still mildly anemic following surgery and we will recheck a CBC. 2. AS s/p bioprosthetic AVR: We will need a follow-up echocardiogram to assess valve gradients.  We can delay this for several months until we see which related coronavirus situation evolves.  We will remind him of endocarditis prophylaxis for dental procedures. 3. Postop AFib: No  clinical recurrence.  Amiodarone and Eliquis have been stopped. 4. HLP: He needs a repeat lipid profile on the current high-dose atorvastatin treatment.  Target LDL less than 70. 5. History of prostate cancer: His follow-up has been delayed due  to the current healthcare situation.  We will add a PSA to his lipid profile.  COVID-19 Education: The signs and symptoms of COVID-19 were discussed with the patient and how to seek care for testing (follow up with PCP or arrange E-visit).  The importance of social distancing was discussed today.  Time:   Today, I have spent 21 minutes with the patient with telehealth technology discussing the above problems.     Medication Adjustments/Labs and Tests Ordered: Current medicines are reviewed at length with the patient today.  Concerns regarding medicines are outlined above.   Tests Ordered: No orders of the defined types were placed in this encounter.   Medication Changes: No orders of the defined types were placed in this encounter.   Disposition:  Follow up 6 months  Signed, Sanda Klein, MD  08/26/2018 8:46 AM    [facility]

## 2018-08-29 ENCOUNTER — Ambulatory Visit (HOSPITAL_COMMUNITY): Payer: PPO

## 2018-08-31 ENCOUNTER — Ambulatory Visit (HOSPITAL_COMMUNITY): Payer: PPO

## 2018-09-02 ENCOUNTER — Ambulatory Visit (HOSPITAL_COMMUNITY): Payer: PPO

## 2018-09-05 ENCOUNTER — Ambulatory Visit (HOSPITAL_COMMUNITY): Payer: PPO

## 2018-09-07 ENCOUNTER — Ambulatory Visit (HOSPITAL_COMMUNITY): Payer: PPO

## 2018-09-09 ENCOUNTER — Ambulatory Visit (HOSPITAL_COMMUNITY): Payer: PPO

## 2018-09-14 ENCOUNTER — Ambulatory Visit (HOSPITAL_COMMUNITY): Payer: PPO

## 2018-09-16 ENCOUNTER — Ambulatory Visit (HOSPITAL_COMMUNITY): Payer: PPO

## 2018-09-19 ENCOUNTER — Ambulatory Visit (HOSPITAL_COMMUNITY): Payer: PPO

## 2018-09-21 ENCOUNTER — Ambulatory Visit (HOSPITAL_COMMUNITY): Payer: PPO

## 2018-09-23 ENCOUNTER — Ambulatory Visit (HOSPITAL_COMMUNITY): Payer: PPO

## 2018-09-25 ENCOUNTER — Other Ambulatory Visit: Payer: Self-pay

## 2018-09-26 ENCOUNTER — Ambulatory Visit (HOSPITAL_COMMUNITY): Payer: PPO

## 2018-09-28 ENCOUNTER — Ambulatory Visit (HOSPITAL_COMMUNITY): Payer: PPO

## 2018-09-28 MED ORDER — METOPROLOL SUCCINATE ER 100 MG PO TB24: 100.0000 mg | ORAL_TABLET | Freq: Every day | ORAL | 3 refills | Status: DC

## 2018-09-30 ENCOUNTER — Ambulatory Visit (HOSPITAL_COMMUNITY): Payer: PPO

## 2018-10-03 ENCOUNTER — Ambulatory Visit (HOSPITAL_COMMUNITY): Payer: PPO

## 2018-10-03 ENCOUNTER — Other Ambulatory Visit: Payer: Self-pay | Admitting: Cardiovascular Disease

## 2018-10-05 ENCOUNTER — Ambulatory Visit (HOSPITAL_COMMUNITY): Payer: PPO

## 2018-10-18 NOTE — Telephone Encounter (Signed)
Open n error °

## 2018-11-17 ENCOUNTER — Other Ambulatory Visit: Payer: Self-pay

## 2019-02-21 ENCOUNTER — Ambulatory Visit (HOSPITAL_COMMUNITY): Payer: PPO | Attending: Cardiology

## 2019-02-21 ENCOUNTER — Other Ambulatory Visit: Payer: Self-pay

## 2019-02-21 DIAGNOSIS — Z952 Presence of prosthetic heart valve: Secondary | ICD-10-CM | POA: Diagnosis not present

## 2019-02-25 ENCOUNTER — Other Ambulatory Visit: Payer: Self-pay | Admitting: Cardiovascular Disease

## 2019-04-07 ENCOUNTER — Ambulatory Visit (INDEPENDENT_AMBULATORY_CARE_PROVIDER_SITE_OTHER): Payer: PPO | Admitting: Cardiovascular Disease

## 2019-04-07 ENCOUNTER — Other Ambulatory Visit: Payer: Self-pay

## 2019-04-07 ENCOUNTER — Encounter: Payer: Self-pay | Admitting: Cardiovascular Disease

## 2019-04-07 VITALS — BP 135/72 | HR 67 | Temp 98.0°F | Ht 72.0 in | Wt 212.8 lb

## 2019-04-07 DIAGNOSIS — E7800 Pure hypercholesterolemia, unspecified: Secondary | ICD-10-CM

## 2019-04-07 DIAGNOSIS — I4891 Unspecified atrial fibrillation: Secondary | ICD-10-CM

## 2019-04-07 DIAGNOSIS — I9789 Other postprocedural complications and disorders of the circulatory system, not elsewhere classified: Secondary | ICD-10-CM

## 2019-04-07 DIAGNOSIS — I447 Left bundle-branch block, unspecified: Secondary | ICD-10-CM | POA: Diagnosis not present

## 2019-04-07 DIAGNOSIS — Z953 Presence of xenogenic heart valve: Secondary | ICD-10-CM

## 2019-04-07 DIAGNOSIS — I2510 Atherosclerotic heart disease of native coronary artery without angina pectoris: Secondary | ICD-10-CM

## 2019-04-07 DIAGNOSIS — I10 Essential (primary) hypertension: Secondary | ICD-10-CM

## 2019-04-07 LAB — COMPREHENSIVE METABOLIC PANEL
ALT: 6 IU/L (ref 0–44)
AST: 10 IU/L (ref 0–40)
Albumin/Globulin Ratio: 1.5 (ref 1.2–2.2)
Albumin: 4.1 g/dL (ref 3.7–4.7)
Alkaline Phosphatase: 70 IU/L (ref 39–117)
BUN/Creatinine Ratio: 19 (ref 10–24)
BUN: 29 mg/dL — ABNORMAL HIGH (ref 8–27)
Bilirubin Total: 0.8 mg/dL (ref 0.0–1.2)
CO2: 21 mmol/L (ref 20–29)
Calcium: 9.1 mg/dL (ref 8.6–10.2)
Chloride: 102 mmol/L (ref 96–106)
Creatinine, Ser: 1.51 mg/dL — ABNORMAL HIGH (ref 0.76–1.27)
GFR calc Af Amer: 51 mL/min/{1.73_m2} — ABNORMAL LOW (ref >59)
GFR calc non Af Amer: 45 mL/min/{1.73_m2} — ABNORMAL LOW (ref >59)
Globulin, Total: 2.8 g/dL (ref 1.5–4.5)
Glucose: 106 mg/dL — ABNORMAL HIGH (ref 65–99)
Potassium: 4.2 mmol/L (ref 3.5–5.2)
Sodium: 137 mmol/L (ref 134–144)
Total Protein: 6.9 g/dL (ref 6.0–8.5)

## 2019-04-07 LAB — LIPID PANEL
Chol/HDL Ratio: 3.2 ratio (ref 0.0–5.0)
Cholesterol, Total: 136 mg/dL (ref 100–199)
HDL: 42 mg/dL (ref >39)
LDL Chol Calc (NIH): 71 mg/dL (ref 0–99)
Triglycerides: 127 mg/dL (ref 0–149)
VLDL Cholesterol Cal: 23 mg/dL (ref 5–40)

## 2019-04-07 NOTE — Patient Instructions (Addendum)
Medication Instructions:  No changes  *If you need a refill on your cardiac medications before your next appointment, please call your pharmacy*  Lab Work: Your physician recommends that you have lab work today ( lipid panel/ CMET)  If you have labs (blood work) drawn today and your tests are completely normal, you will receive your results only by: [name] MyChart Message (if you have MyChart) OR . A paper copy in the mail If you have any lab test that is abnormal or we need to change your treatment, we will call you to review the results.  Follow-Up: At [facility], you and your health needs are our priority.  As part of our continuing mission to provide you with exceptional heart care, we have created designated Provider Care Teams.  These Care Teams include your primary Cardiologist (physician) and Advanced Practice Providers (APPs -  Physician Assistants and Nurse Practitioners) who all work together to provide you with the care you need, when you need it.  Your next appointment:   12 month(s)  The format for your next appointment:   Either In Person or Virtual  Provider:   Dr. Sallyanne Kuster

## 2019-04-09 ENCOUNTER — Encounter: Payer: Self-pay | Admitting: Cardiovascular Disease

## 2019-04-09 NOTE — Progress Notes (Signed)
**Note Lawrence-Identified via Obfuscation** Patient ID: [name], male   DOB: [birth year]Lawrence Hall, 74 y.o.   MRN: [id number] Patient ID: Lawrence Hall, male   DOB: [birth year] Lawrence Hall, 73 y.o.   MRN: [id number]    Cardiology Office Note    Date:  04/09/2019   ID:  Lawrence Hall, Lawrence Hall, Lawrence Hall, MRN [id number]  PCP:  Fanny Bien, MD  Cardiologist:   Sanda Klein, MD   Chief Complaint  Patient presents with  . Coronary Artery Disease  . Cardiac Valve Problem    History of Present Illness:  Lawrence Hall is a 73 y.o. male with hyperlipidemia,, hypertension, coronary artery disease, history of non-ST segment elevation myocardial infarction in 2017 due to a high-grade stenosis in the (proximal left circumflex coronary drug-eluting stent, 3.534 Resolute), returning with exertional angina/dyspnea and hypotensive response to stress testing in January 2020, found to have left main and proximal right coronary stenoses.  He therefore underwent CABG x3 (LIMA to LAD, SVG to OM, SVG to RCA) and aortic valve replacement with a 25 mm Edwards pericardial valve by Dr. Prescott Gum on April 29, 2018.  Postoperative course was complicated by atrial fibrillation and development of a left bundle branch block, superficial vein irritation probably from amiodarone.  From a cardiovascular point of view he is very well.  He had an episode of severe food poisoning from some fast food several weeks ago.  The symptoms began approximately 1 hour after ingesting omeprazole for that less than 24 hours.  He has had some issues with hematuria since then, although this is likely unrelated.  He is due to see his urologist in the next few days.  He described himself as being remarkably short tempered since his surgical procedure.  Echocardiogram performed February 21, 2019 shows excellent aortic valve prosthesis parameters (peak gradient 16, mean gradient 10, dimensionless index 0.6) and normal left ventricular systolic function.  Past Medical History:  Diagnosis Date  . Arthritis     mild hands  . Asthma   . Cataract    early signs b/l eyes  . Dysrhythmia    PAC'S  . ED (erectile dysfunction)   . H/O hiatal hernia   . Hypercholesterolemia   . Hypertension    MARGINALLY HIGH - NO MEDS -MONITORS  . Prostate cancer ([place]) 03/27/2011   prostate bx=adenocarcinoma,  . Skin cancer   . Skin cancer 2008   basal cell face /removed  . Sleep apnea    USES NOSTRIL APARATUS - NO C-PAP- "BORDERLINE SLEEP APMNEA"    Past Surgical History:  Procedure Laterality Date  . AORTIC VALVE REPLACEMENT N/A 04/29/2018   Procedure: AORTIC VALVE REPLACEMENT (AVR) using INSPIRIS RESILIA  AORTIC VALVE 21mm;  Surgeon: Ivin Poot, MD;  Location: [place];  Service: Open Heart Surgery;  Laterality: N/A;  . basal cell cancer  2008   inside nose  . CARDIAC CATHETERIZATION N/A 06/03/2015   Procedure: Left Heart Cath and Coronary Angiography;  Surgeon: [name] Sine, MD;  Location: [place] [facility];  Service: Cardiovascular;  Laterality: N/A;  . CORONARY ARTERY BYPASS GRAFT N/A 04/29/2018   Procedure: CORONARY ARTERY BYPASS GRAFTING (CABG) X 3; Using Left Internal Mammary Artery (LIMA) and Right Leg Greater Saphenous Vein Graft (SVG);  LIMA to LAD, SVG to OM1, SVG to RCA,;  Surgeon: Ivin Poot, MD;  Location: [place];  Service: Open Heart Surgery;  Laterality: N/A;  . RIGHT/LEFT HEART CATH AND CORONARY ANGIOGRAPHY N/A 04/26/2018   Procedure: RIGHT/LEFT HEART CATH AND  CORONARY ANGIOGRAPHY;  Surgeon: [name] Sine, MD;  Location: [place] [facility];  Service: Cardiovascular;  Laterality: N/A;  . ROBOT ASSISTED LAPAROSCOPIC RADICAL PROSTATECTOMY  08/10/2011   Procedure: ROBOTIC ASSISTED LAPAROSCOPIC RADICAL PROSTATECTOMY LEVEL 2;  Surgeon: Dutch Gray, MD;  [facility];  Service: Urology;  Laterality: N/A;  . TEE WITHOUT CARDIOVERSION N/A 04/29/2018   Procedure: TRANSESOPHAGEAL ECHOCARDIOGRAM (TEE);  Surgeon: Prescott Gum, Collier Salina, MD;  Location: [place];  Service: Open Heart Surgery;  Laterality:  N/A;  . THYROID SURGERY  age [age]    Current Medications: Outpatient Medications Prior to Visit  Medication Sig Dispense Refill  . aspirin 81 MG EC tablet Take 1 tablet (81 mg total) by mouth daily.    [name] atorvastatin (LIPITOR) 80 MG tablet Take 1 tablet (80 mg total) by mouth daily at 6 PM. 90 tablet 3  . chlorhexidine (PERIDEX) 0.12 % solution Rinse with 15 mls twice daily for 30 seconds. Use after breakfast and at bedtime. Spit out excess. Do not swallow. 960 mL 5  . Cholecalciferol (VITAMIN D-3) 125 MCG (5000 UT) TABS Take 5,000 Units by mouth daily.    . Coenzyme Q10 (COQ10) 200 MG CAPS Take 200 mg by mouth daily.    . Cyanocobalamin (VITAMIN B 12 PO)     . MEGARED OMEGA-3 KRILL OIL 500 MG CAPS Take 1 capsule by mouth daily.     . metoprolol succinate (TOPROL-XL) 100 MG 24 hr tablet Take 1 tablet (100 mg total) by mouth at bedtime. Take with or immediately following a meal. 90 tablet 3  . vitamin E 400 UNIT capsule Take 400 Units by mouth daily.    [name] amoxicillin (AMOXIL) 500 MG capsule Take 4 tablets (2,000mg ) 1 hour prior to dental procedure 4 capsule 5  . irbesartan (AVAPRO) 75 MG tablet TAKE 1 TABLET ONCE DAILY. 90 tablet 3   No facility-administered medications prior to visit.     Allergies:   Other   Social History   Socioeconomic History  . Marital status: Married    Spouse name: Not on file  . Number of children: Not on file  . Years of education: Not on file  . Highest education level: Not on file  Occupational History    Employer: [employer]    Comment: customer service [employer]  Tobacco Use  . Smoking status: Never Smoker  . Smokeless tobacco: Never Used  Substance and Sexual Activity  . Alcohol use: Yes    Comment: Occasional  . Drug use: No  . Sexual activity: Not on file  Other Topics Concern  . Not on file  Social History Narrative  . Not on file   Social Determinants of Health   Financial Resource Strain:   . Difficulty of Paying Living  Expenses: Not on file  Food Insecurity:   . Worried About Charity fundraiser in the Last Year: Not on file  . Ran Out of Food in the Last Year: Not on file  Transportation Needs:   . Lack of Transportation (Medical): Not on file  . Lack of Transportation (Non-Medical): Not on file  Physical Activity:   . Days of Exercise per Week: Not on file  . Minutes of Exercise per Session: Not on file  Stress:   . Feeling of Stress : Not on file  Social Connections:   . Frequency of Communication with Friends and Family: Not on file  . Frequency of Social Gatherings with Friends and Family: Not on  file  . Attends Religious Services: Not on file  . Active Member of Clubs or Organizations: Not on file  . Attends Archivist Meetings: Not on file  . Marital Status: Not on file     Family History:  The patient's family history includes Breast cancer (age of onset: 46) in his mother; CAD in his brother; Cervical cancer in his sister and sister; Prostate cancer (age of onset: 50) in his father.   ROS:   Please see the history of present illness.    All other systems are reviewed and are negative.  PHYSICAL EXAM:   VS:  BP 135/72   Pulse 67   Temp 98 F (36.7 C)   Ht 6' (1.829 m)   Wt 212 lb 12.8 oz (96.5 kg)   SpO2 95%   BMI 28.86 kg/m       General: Alert, oriented x3, no distress, appears Head: no evidence of trauma, PERRL, EOMI, no exophtalmos or lid lag, no myxedema, no xanthelasma; normal ears, nose and oropharynx Neck: normal jugular venous pulsations and no hepatojugular reflux; brisk carotid pulses without delay and no carotid bruits Chest: clear to auscultation, no signs of consolidation by percussion or palpation, normal fremitus, symmetrical and full respiratory excursions Cardiovascular: normal position and quality of the apical impulse, regular rhythm, normal first and second heart sounds, barely audible systolic ejection murmur in the aortic focus, no diastolic  murmurs, rubs or gallops.  Well-healed sternotomy scar. Abdomen: no tenderness or distention, no masses by palpation, no abnormal pulsatility or arterial bruits, normal bowel sounds, no hepatosplenomegaly Extremities: no clubbing, cyanosis or edema; 2+ radial, ulnar and brachial pulses bilaterally; 2+ right femoral, posterior tibial and dorsalis pedis pulses; 2+ left femoral, posterior tibial and dorsalis pedis pulses; no subclavian or femoral bruits Neurological: grossly nonfocal Psych: Normal mood and affect   Wt Readings from Last 3 Encounters:  04/07/19 212 lb 12.8 oz (96.5 kg)  08/26/18 206 lb (93.4 kg)  06/22/18 205 lb (93 kg)      Studies/Labs Reviewed:   ECHO 02/21/2019:   1. Left ventricular ejection fraction, by visual estimation, is 55 to 60%. The left ventricle has normal function. There is mildly increased left ventricular hypertrophy.  2. Left ventricular diastolic parameters are consistent with Grade I diastolic dysfunction (impaired relaxation).  3. Global right ventricle has normal systolic function.The right ventricular size is normal. No increase in right ventricular wall thickness.  4. Left atrial size was normal.  5. Right atrial size was normal.  6. The mitral valve is normal in structure. Trace mitral valve regurgitation. No evidence of mitral stenosis.  7. The tricuspid valve is normal in structure. Tricuspid valve regurgitation is mild.  8. The aortic valve is normal in structure. Aortic valve regurgitation is not visualized. No evidence of aortic valve sclerosis or stenosis.  9. The pulmonic valve was normal in structure. Pulmonic valve regurgitation is not visualized. 10. Mildly elevated pulmonary artery systolic pressure. 11. The inferior vena cava is normal in size with greater than 50% respiratory variability, suggesting right atrial pressure of 3 mmHg.  EKG:  EKG is  ordered today.  It shows sinus rhythm with first-degree AV block and an old nonspecific  intraventricular conduction delay/atypical LBBB.  PR interval 294 ms, QRS 132 ms, QTC 460 ms BMET    Component Value Date/Time   NA 137 04/07/2019 1021   K 4.2 04/07/2019 1021   CL 102 04/07/2019 1021   CO2 21 04/07/2019 1021  GLUCOSE 106 (H) 04/07/2019 1021   GLUCOSE 94 Hall/16/2020 0432   BUN 29 (H) 04/07/2019 1021   CREATININE 1.51 (H) 04/07/2019 1021   CALCIUM 9.1 04/07/2019 1021   GFRNONAA 45 (L) 04/07/2019 1021   GFRAA 51 (L) 04/07/2019 1021    2018  triglycerides 120, total cholesterol 113, LDL 47, HDL 42. 2019 LDL cholesterol 69 Lipid Panel     Component Value Date/Time   CHOL 136 04/07/2019 1021   TRIG 127 04/07/2019 1021   HDL 42 04/07/2019 1021   CHOLHDL 3.2 04/07/2019 1021   CHOLHDL 5.0 06/01/2015 1202   VLDL 52 (H) 06/01/2015 1202   LDLCALC 71 04/07/2019 1021     ASSESSMENT:    1. Coronary artery disease involving native coronary artery of native heart without angina pectoris   2. History of aortic valve replacement with bioprosthetic valve   3. Postoperative atrial fibrillation (HCC)   4. LBBB (left bundle branch block)   5. Essential hypertension   6. Hypercholesterolemia      PLAN:  In order of problems listed above:  1. [name]: Asymptomatic without angina or heart failure symptoms.  Well-healed sternotomy.  Not sure if there is a direct relationship, but it is possible that his personality changes are related to the cardiopulmonary bypass. 2. S/P AVR: Excellent baseline aortic prosthesis parameters. 3. AFib: Postoperatively.  Anticoagulation and amiodarone have been stopped.  Should continue aspirin. 4. LBBB/1st deg AVB: No signs or symptoms of high-grade AV block.  Talked about the fact that this makes his ECG nondiagnostic for ischemia, but has few other implications at this time.  Avoid higher beta-blocker doses or other negative chronotropic agents. 5. HTN: Adequate control 6. HLP: All lipid parameters are acceptable. 7. Recent food  poisoning sounds like it might have been staphylococcal toxin related and is unlikely to the long-term sequelae.  I doubt that hematuria is related to this.  I think if he had SHiga toxin +ve E. coli  infection he would have had a much more severe presentation.  Medication Adjustments/Labs and Tests Ordered: Current medicines are reviewed at length with the patient today.  Concerns regarding medicines are outlined above.  Medication changes, Labs and Tests ordered today are listed in the Patient Instructions below. Patient Instructions  Medication Instructions:  No changes  *If you need a refill on your cardiac medications before your next appointment, please call your pharmacy*  Lab Work: Your physician recommends that you have lab work today ( lipid panel/ CMET)  If you have labs (blood work) drawn today and your tests are completely normal, you will receive your results only by: [name] MyChart Message (if you have MyChart) OR . A paper copy in the mail If you have any lab test that is abnormal or we need to change your treatment, we will call you to review the results.  Follow-Up: At [facility], you and your health needs are our priority.  As part of our continuing mission to provide you with exceptional heart care, we have created designated Provider Care Teams.  These Care Teams include your primary Cardiologist (physician) and Advanced Practice Providers (APPs -  Physician Assistants and Nurse Practitioners) who all work together to provide you with the care you need, when you need it.  Your next appointment:   12 month(s)  The format for your next appointment:   Either In Person or Virtual  Provider:   Dr. Sallyanne Kuster      Signed, Sanda Klein, MD  04/09/2019 11:57 AM    [address], [place], [place]  [address] Phone: [phone]; Fax: [phone]

## 2019-05-03 DIAGNOSIS — C61 Malignant neoplasm of prostate: Secondary | ICD-10-CM | POA: Diagnosis not present

## 2019-05-03 DIAGNOSIS — N5201 Erectile dysfunction due to arterial insufficiency: Secondary | ICD-10-CM | POA: Diagnosis not present

## 2019-05-03 DIAGNOSIS — N393 Stress incontinence (female) (male): Secondary | ICD-10-CM | POA: Diagnosis not present

## 2019-05-03 DIAGNOSIS — R8271 Bacteriuria: Secondary | ICD-10-CM | POA: Diagnosis not present

## 2019-05-03 DIAGNOSIS — N3000 Acute cystitis without hematuria: Secondary | ICD-10-CM | POA: Diagnosis not present

## 2019-05-29 DIAGNOSIS — N3000 Acute cystitis without hematuria: Secondary | ICD-10-CM | POA: Diagnosis not present

## 2019-06-11 ENCOUNTER — Ambulatory Visit: Payer: PPO | Attending: Internal Medicine

## 2019-06-11 DIAGNOSIS — Z23 Encounter for immunization: Secondary | ICD-10-CM | POA: Insufficient documentation

## 2019-06-11 NOTE — Progress Notes (Signed)
   Covid-19 Vaccination Clinic  Name:  Lawrence Hall    MRN: [id number] DOB: [birth year]  06/11/2019  [name] was observed post Covid-19 immunization for 30 minutes based on pre-vaccination screening without incidence. He was provided with Vaccine Information Sheet and instruction to access the V-Safe system.   [name] was instructed to call 911 with any severe reactions post vaccine: [name] Difficulty breathing  . Swelling of your face and throat  . A fast heartbeat  . A bad rash all over your body  . Dizziness and weakness    Immunizations Administered    Name Date Dose VIS Date Route   Pfizer COVID-19 Vaccine 06/11/2019 11:23 AM 0.3 mL 03/31/2019 Intramuscular   Manufacturer: [place]   Lot: [id number]   [place]: [id number]

## 2019-06-20 ENCOUNTER — Other Ambulatory Visit: Payer: Self-pay | Admitting: Cardiovascular Disease

## 2019-06-20 ENCOUNTER — Other Ambulatory Visit: Payer: Self-pay | Admitting: *Deleted

## 2019-06-27 NOTE — Telephone Encounter (Signed)
No, nothing to add. That was all good advice.

## 2019-06-29 ENCOUNTER — Other Ambulatory Visit: Payer: Self-pay | Admitting: Cardiovascular Disease

## 2019-07-11 ENCOUNTER — Ambulatory Visit: Payer: PPO | Attending: Internal Medicine

## 2019-07-11 DIAGNOSIS — Z23 Encounter for immunization: Secondary | ICD-10-CM

## 2019-07-11 NOTE — Progress Notes (Signed)
   Covid-19 Vaccination Clinic  Name:  AHIL RAFFO    MRN: [id number] DOB: [birth year]  07/11/2019  Mr. Verhagen was observed post Covid-19 immunization for 15 minutes without incident. He was provided with Vaccine Information Sheet and instruction to access the V-Safe system.   Mr. Kromer was instructed to call 911 with any severe reactions post vaccine: [name] Difficulty breathing  . Swelling of face and throat  . A fast heartbeat  . A bad rash all over body  . Dizziness and weakness   Immunizations Administered    Name Date Dose VIS Date Route   Pfizer COVID-19 Vaccine 07/11/2019 11:45 AM 0.3 mL 03/31/2019 Intramuscular   Manufacturer: [place]   Lot: [id number]   [place]: [id number]

## 2019-07-14 DIAGNOSIS — R3121 Asymptomatic microscopic hematuria: Secondary | ICD-10-CM | POA: Diagnosis not present

## 2019-07-19 ENCOUNTER — Ambulatory Visit: Payer: PPO | Admitting: Family Medicine

## 2019-07-24 ENCOUNTER — Other Ambulatory Visit: Payer: Self-pay

## 2019-07-24 ENCOUNTER — Ambulatory Visit (INDEPENDENT_AMBULATORY_CARE_PROVIDER_SITE_OTHER): Payer: PPO | Admitting: Family Medicine

## 2019-07-24 ENCOUNTER — Encounter: Payer: Self-pay | Admitting: Family Medicine

## 2019-07-24 VITALS — BP 120/68 | HR 67 | Temp 98.4°F | Ht 72.0 in | Wt 207.9 lb

## 2019-07-24 DIAGNOSIS — I10 Essential (primary) hypertension: Secondary | ICD-10-CM

## 2019-07-24 DIAGNOSIS — Z953 Presence of xenogenic heart valve: Secondary | ICD-10-CM | POA: Diagnosis not present

## 2019-07-24 DIAGNOSIS — E785 Hyperlipidemia, unspecified: Secondary | ICD-10-CM | POA: Diagnosis not present

## 2019-07-24 DIAGNOSIS — Z951 Presence of aortocoronary bypass graft: Secondary | ICD-10-CM | POA: Diagnosis not present

## 2019-07-24 DIAGNOSIS — Z8546 Personal history of malignant neoplasm of prostate: Secondary | ICD-10-CM

## 2019-07-24 DIAGNOSIS — M25511 Pain in right shoulder: Secondary | ICD-10-CM | POA: Diagnosis not present

## 2019-07-24 NOTE — Patient Instructions (Addendum)
Tart cherry juice is a good natural anti-inflammatory.

## 2019-07-24 NOTE — Progress Notes (Signed)
Lawrence Hall DOB: [birth year] Encounter date: 07/24/2019  This is a 74 y.o. male who presents to establish care. Chief Complaint  Patient presents with  . Establish Care    History of present illness:  Has completed both COVID vaccinations. Between first and second vaccines (about 6 weeks ago) started getting pain in front of right shoulder. Occasionally runs down right arm to top of hand. Pain with abduction. Controlled with tylenol. Suspects something w rotator cuff. Doesn't remember injuring it. Has been managing ok, but not improving. Wakes him up at night if he doesn't take tylenol. Does feel a little weak on that side. Sometimes has numbness in some of fingers and hand. NO neck pain. He is a side sleeper for year; usually sleeps on right. This is first time he has had issue with right shoulder.   HTN: has a machine at home, but hasn't been checking regularly. Usually 125-130/75 (surprised by bp today).   CAD: follows regularly with Dr. Sallyanne Kuster; makes sure to get 5000 steps daily.   Asthma: states not sure what this diagnosis was about. Had short issue with breathing; inhaler x 6 months but then taken off. Did some pulm testing at that time. Has done well since cardiac surgery.   Skin cancer: hasn't followed up with specialist since 2010. Had slight growth on nose in 2010, but ended up deciding this was benign.    Prostate cancer: has appointment on weds w urologist. Cancer in 2013; prostate removed and cancer free since then. In process of yearly eval; they found some blood in urine; treated with antibiotics and worried because everything was looking ok, but now they are looking into kidneys to make sure no issues there. Sees. Dr. Alinda Money at [facility] urology.   HL: tolerates the lipitor 80mg  without difficulty.   Borderline sleep apnea: no treatment recommended - just lifestyle changes recommended. Last sleep study around 2010.    Past Medical History:  Diagnosis Date  . Arthritis     mild hands  . Asthma   . Chest pain 07/25/2012  . Dysrhythmia    PAC'S  . ED (erectile dysfunction)   . H/O hiatal hernia   . Hypercholesterolemia   . Hypertension    MARGINALLY HIGH - NO MEDS -MONITORS  . Prostate cancer ([facility]) 03/27/2011   prostate bx=adenocarcinoma,  . Skin cancer 2008   basal cell face /removed  . Sleep apnea    USES NOSTRIL APARATUS - NO C-PAP- "BORDERLINE SLEEP APMNEA"   Past Surgical History:  Procedure Laterality Date  . AORTIC VALVE REPLACEMENT N/A 04/29/2018   Procedure: AORTIC VALVE REPLACEMENT (AVR) using INSPIRIS RESILIA  AORTIC VALVE 12mm;  Surgeon: Ivin Poot, MD;  Location: [place];  Service: Open Heart Surgery;  Laterality: N/A;  . basal cell cancer  2008   inside nose  . CARDIAC CATHETERIZATION N/A 06/03/2015   Procedure: Left Heart Cath and Coronary Angiography;  Surgeon: [name] Sine, MD;  Location: [place] [facility];  Service: Cardiovascular;  Laterality: N/A;  . CATARACT EXTRACTION, BILATERAL  2007  . CORONARY ARTERY BYPASS GRAFT N/A 04/29/2018   Procedure: CORONARY ARTERY BYPASS GRAFTING (CABG) X 3; Using Left Internal Mammary Artery (LIMA) and Right Leg Greater Saphenous Vein Graft (SVG);  LIMA to LAD, SVG to OM1, SVG to RCA,;  Surgeon: Ivin Poot, MD;  Location: [place];  Service: Open Heart Surgery;  Laterality: N/A;  . RIGHT/LEFT HEART CATH AND CORONARY ANGIOGRAPHY N/A 04/26/2018   Procedure: RIGHT/LEFT HEART  CATH AND CORONARY ANGIOGRAPHY;  Surgeon: [name] Sine, MD;  Location: [place] [facility];  Service: Cardiovascular;  Laterality: N/A;  . ROBOT ASSISTED LAPAROSCOPIC RADICAL PROSTATECTOMY  08/10/2011   Procedure: ROBOTIC ASSISTED LAPAROSCOPIC RADICAL PROSTATECTOMY LEVEL 2;  Surgeon: Dutch Gray, MD;  [facility];  Service: Urology;  Laterality: N/A;  . TEE WITHOUT CARDIOVERSION N/A 04/29/2018   Procedure: TRANSESOPHAGEAL ECHOCARDIOGRAM (TEE);  Surgeon: Prescott Gum, Collier Salina, MD;  Location: [place];  Service: Open Heart Surgery;   Laterality: N/A;  . THYROID SURGERY  infant   uncertain details; no thyroid replacement listed   Allergies  Allergen Reactions  . Other Itching and Other (See Comments)    Pet dander, seasonal allergies = Runny nose, itchy eyes   Current Meds  Medication Sig  . aspirin 81 MG EC tablet Take 1 tablet (81 mg total) by mouth daily.  [name] atorvastatin (LIPITOR) 80 MG tablet TAKE 1 TABLET ONCE DAILY AT 6 PM.  . chlorhexidine (PERIDEX) 0.12 % solution Rinse with 15 mls twice daily for 30 seconds. Use after breakfast and at bedtime. Spit out excess. Do not swallow.  . Cholecalciferol (VITAMIN D-3) 125 MCG (5000 UT) TABS Take 5,000 Units by mouth daily.  . Coenzyme Q10 (COQ10) 200 MG CAPS Take 200 mg by mouth daily.  . Cyanocobalamin (VITAMIN B 12 PO)   . MEGARED OMEGA-3 KRILL OIL 500 MG CAPS Take 1 capsule by mouth daily.   . nitroGLYCERIN (NITROSTAT) 0.4 MG SL tablet 1 TAB UNDER TONGUE AS NEEDED FOR CHEST PAIN. MAY REPEAT EVERY 5 MIN FOR A TOTAL OF 3 DOSES.  [name] vitamin E 400 UNIT capsule Take 400 Units by mouth daily.   Social History   Tobacco Use  . Smoking status: Never Smoker  . Smokeless tobacco: Never Used  Substance Use Topics  . Alcohol use: Not Currently    Comment: Occasional   Family History  Problem Relation Age of Onset  . Prostate cancer Father 80       in his 70's/other comorbidities/79 deceased  . Alcohol abuse Father   . Liver disease Father   . Heart attack Father 76  . Breast cancer Mother 49       mets to bone,deceased age 46  . Cervical cancer Sister   . Cervical cancer Sister   . Breast cancer Sister   . CAD Brother        Died of sudden cardiac death/MI     Review of Systems  Constitutional: Negative for chills, fatigue and fever.  Respiratory: Negative for cough, chest tightness, shortness of breath and wheezing.   Cardiovascular: Negative for chest pain, palpitations and leg swelling.  Musculoskeletal: Positive for arthralgias.    Objective:  BP  120/68   Pulse 67   Temp 98.4 F (36.9 C) (Temporal)   Ht 6' (1.829 m)   Wt 207 lb 14.4 oz (94.3 kg)   BMI 28.20 kg/m   Weight: 207 lb 14.4 oz (94.3 kg)   BP Readings from Last 3 Encounters:  07/24/19 120/68  04/07/19 135/72  08/26/18 126/76   Wt Readings from Last 3 Encounters:  07/24/19 207 lb 14.4 oz (94.3 kg)  04/07/19 212 lb 12.8 oz (96.5 kg)  08/26/18 206 lb (93.4 kg)    Physical Exam Constitutional:      General: He is not in acute distress.    Appearance: He is well-developed.  Cardiovascular:     Rate and Rhythm: Normal rate and regular rhythm.  Heart sounds: Murmur present. Systolic murmur present with a grade of 2/6. No friction rub.  Pulmonary:     Effort: Pulmonary effort is normal. No respiratory distress.     Breath sounds: Normal breath sounds. No wheezing or rales.  Musculoskeletal:     Right lower leg: No edema.     Left lower leg: No edema.     Comments: No cervical spine tenderness.  No limitation to range of motion of the neck.  Negative Spurling's.  Pain with anterior abduction of the shoulder greater than 100 degrees.  Negative Hawkins.  Decreased pain with assisted abduction.  There is some tenderness at the base of the deltoid as well as biceps insertion.  No weakness with abduction no weakness of supraspinatus testing, no weakness with bicep or tricep testing.  There is some tenderness around the deltoid with tricep testing.  No bony tenderness with palpation.  Neurological:     Mental Status: He is alert and oriented to person, place, and time.     Motor: Motor function is intact. No weakness.  Psychiatric:        Behavior: Behavior normal.     Assessment/Plan:  1. Hyperlipidemia, unspecified hyperlipidemia type Well-controlled on 80 mg Lipitor.  He also works on [employer] and regular exercise.  2. S/P CABG x 3 Follows with cardiology regularly.  Lipids are controlled.  Blood pressure controlled.  3. History of aortic valve  replacement with bioprosthetic valve Follows with cardiology.  Good exercise tolerance.  4. Acute pain of right shoulder We are going to work on gentle stretches involving deltoid and biceps, with icing twice daily to help with inflammation.  Suggested tart cherry juice as a natural anti-inflammatory.  Let me know if symptoms or not improved in 2 weeks time.  5. Essential hypertension Well-controlled.  Check at home on a regular basis.  Continue current medications.  6. History of prostate cancer Follows with [facility].  Return for physical exam when able.  [name], MD

## 2019-07-26 DIAGNOSIS — N2889 Other specified disorders of kidney and ureter: Secondary | ICD-10-CM | POA: Diagnosis not present

## 2019-07-26 DIAGNOSIS — R3121 Asymptomatic microscopic hematuria: Secondary | ICD-10-CM | POA: Diagnosis not present

## 2019-08-01 DIAGNOSIS — D49512 Neoplasm of unspecified behavior of left kidney: Secondary | ICD-10-CM | POA: Diagnosis not present

## 2019-08-01 DIAGNOSIS — R8271 Bacteriuria: Secondary | ICD-10-CM | POA: Diagnosis not present

## 2019-08-10 DIAGNOSIS — D49512 Neoplasm of unspecified behavior of left kidney: Secondary | ICD-10-CM | POA: Diagnosis not present

## 2019-08-10 DIAGNOSIS — R918 Other nonspecific abnormal finding of lung field: Secondary | ICD-10-CM | POA: Diagnosis not present

## 2019-08-10 DIAGNOSIS — I700 Atherosclerosis of aorta: Secondary | ICD-10-CM | POA: Diagnosis not present

## 2019-08-13 ENCOUNTER — Encounter: Payer: Self-pay | Admitting: Cardiovascular Disease

## 2019-08-14 ENCOUNTER — Encounter: Payer: Self-pay | Admitting: Family Medicine

## 2019-08-14 ENCOUNTER — Other Ambulatory Visit: Payer: Self-pay | Admitting: Urology

## 2019-08-15 ENCOUNTER — Other Ambulatory Visit: Payer: Self-pay

## 2019-08-15 ENCOUNTER — Encounter (HOSPITAL_COMMUNITY)
Admission: RE | Admit: 2019-08-15 | Discharge: 2019-08-15 | Disposition: A | Discharge status: Home / Self Care | Payer: PPO | Source: Ambulatory Visit | Attending: Urology | Admitting: Urology

## 2019-08-15 ENCOUNTER — Encounter (HOSPITAL_COMMUNITY): Payer: Self-pay

## 2019-08-15 ENCOUNTER — Other Ambulatory Visit (HOSPITAL_COMMUNITY): Payer: PPO

## 2019-08-15 DIAGNOSIS — Z79899 Other long term (current) drug therapy: Secondary | ICD-10-CM | POA: Diagnosis not present

## 2019-08-15 DIAGNOSIS — D49512 Neoplasm of unspecified behavior of left kidney: Secondary | ICD-10-CM | POA: Diagnosis not present

## 2019-08-15 DIAGNOSIS — Z8546 Personal history of malignant neoplasm of prostate: Secondary | ICD-10-CM | POA: Diagnosis not present

## 2019-08-15 DIAGNOSIS — I252 Old myocardial infarction: Secondary | ICD-10-CM | POA: Insufficient documentation

## 2019-08-15 DIAGNOSIS — Z7982 Long term (current) use of aspirin: Secondary | ICD-10-CM | POA: Diagnosis not present

## 2019-08-15 DIAGNOSIS — E7800 Pure hypercholesterolemia, unspecified: Secondary | ICD-10-CM | POA: Diagnosis not present

## 2019-08-15 DIAGNOSIS — R011 Cardiac murmur, unspecified: Secondary | ICD-10-CM | POA: Insufficient documentation

## 2019-08-15 DIAGNOSIS — I10 Essential (primary) hypertension: Secondary | ICD-10-CM | POA: Insufficient documentation

## 2019-08-15 DIAGNOSIS — Z7901 Long term (current) use of anticoagulants: Secondary | ICD-10-CM | POA: Insufficient documentation

## 2019-08-15 DIAGNOSIS — Z01812 Encounter for preprocedural laboratory examination: Secondary | ICD-10-CM | POA: Insufficient documentation

## 2019-08-15 DIAGNOSIS — I2510 Atherosclerotic heart disease of native coronary artery without angina pectoris: Secondary | ICD-10-CM | POA: Insufficient documentation

## 2019-08-15 HISTORY — DX: Other specified postprocedural states: Z98.890

## 2019-08-15 HISTORY — DX: Acute myocardial infarction, unspecified: I21.9

## 2019-08-15 HISTORY — DX: Cardiac murmur, unspecified: R01.1

## 2019-08-15 HISTORY — DX: Nausea with vomiting, unspecified: R11.2

## 2019-08-15 HISTORY — DX: Atherosclerotic heart disease of native coronary artery without angina pectoris: I25.10

## 2019-08-15 HISTORY — DX: Pneumonia, unspecified organism: J18.9

## 2019-08-15 LAB — BASIC METABOLIC PANEL
Anion gap: 10 (ref 5–15)
BUN: 25 mg/dL — ABNORMAL HIGH (ref 8–23)
CO2: 23 mmol/L (ref 22–32)
Calcium: 9 mg/dL (ref 8.9–10.3)
Chloride: 106 mmol/L (ref 98–111)
Creatinine, Ser: 1.11 mg/dL (ref 0.61–1.24)
GFR calc Af Amer: >60 mL/min (ref >60)
GFR calc non Af Amer: >60 mL/min (ref >60)
Glucose, Bld: 101 mg/dL — ABNORMAL HIGH (ref 70–99)
Potassium: 4.2 mmol/L (ref 3.5–5.1)
Sodium: 139 mmol/L (ref 135–145)

## 2019-08-15 LAB — CBC
HCT: 37.7 % — ABNORMAL LOW (ref 39.0–52.0)
Hemoglobin: 12.5 g/dL — ABNORMAL LOW (ref 13.0–17.0)
MCH: 27.6 pg (ref 26.0–34.0)
MCHC: 33.2 g/dL (ref 30.0–36.0)
MCV: 83.2 fL (ref 80.0–100.0)
Platelets: 251 10*3/uL (ref 150–400)
RBC: 4.53 MIL/uL (ref 4.22–5.81)
RDW: 14.5 % (ref 11.5–15.5)
WBC: 6.5 10*3/uL (ref 4.0–10.5)
nRBC: 0 % (ref 0.0–0.2)

## 2019-08-15 NOTE — Progress Notes (Addendum)
DUE TO COVID-19 ONLY ONE VISITOR IS ALLOWED TO COME WITH YOU AND STAY IN THE WAITING ROOM ONLY DURING PRE OP AND PROCEDURE DAY OF SURGERY. THE 1 VISITOR MAY VISIT WITH YOU AFTER SURGERY IN YOUR PRIVATE ROOM DURING VISITING HOURS ONLY!  YOU NEED TO HAVE A COVID 19 TEST ON__4/29/21 ____ @___1130____ , THIS TEST MUST BE DONE BEFORE SURGERY, COME  [place], [place] , [address].  ([place]) ONCE YOUR COVID TEST IS COMPLETED, PLEASE BEGIN THE QUARANTINE INSTRUCTIONS AS OUTLINED IN YOUR HANDOUT.                ERLON GASBARRO  08/15/2019   Your procedure is scheduled on:  08/21/19   Report to [facility] Main  Entrance   Report to admitting at   140pm  Call thipms number if you have problems the morning of surgery [phone]    Remember: May have light breakfast by 0730am morning of surgery.  Toast , scrambled egg, coffee, juice,fruit.   No solid food after light breakfast at 0730am .  May have clear liquids untl 1100am morning of surgery then nothing by mouth.  . BRUSH YOUR TEETH MORNING OF SURGERY AND RINSE YOUR MOUTH OUT, NO CHEWING GUM CANDY OR MINTS. [name]      Take these medicines the morning of surgery with A SIP OF WATER: Toprol      CLEAR LIQUID DIET   Foods Allowed                                                                     Foods Excluded  Coffee and tea, regular and decaf                             liquids that you cannot  Plain Jell-O any favor except red or purple                                           see through such as: Fruit ices (not with fruit pulp)                                     milk, soups, orange juice  Iced Popsicles                                    All solid food Carbonated beverages, regular and diet                                    Cranberry, grape and apple juices Sports drinks like Gatorade Lightly seasoned clear broth or consume(fat free) Sugar, honey syrup  Sample Menu Breakfast                                 Lunch  Supper Cranberry juice                    Beef broth                            Chicken broth Jell-O                                     Grape juice                           Apple juice Coffee or tea                        Jell-O                                      Popsicle                                                Coffee or tea                        Coffee or tea  _____________________________________________________________________                                 Dennis Bast may not have any metal on your body including hair pins and              piercings  Do not wear jewelry, make-up, lotions, powders or perfumes, deodorant             Do not wear nail polish on your fingernails.  Do not shave  48 hours prior to surgery.              Men may shave face and neck.   Do not bring valuables to the hospital. [place].  Contacts, dentures or bridgework may not be worn into surgery.  Leave suitcase in the car. After surgery it may be brought to your room.     Patients discharged the day of surgery will not be allowed to drive home. IF YOU ARE HAVING SURGERY AND GOING HOME THE SAME DAY, YOU MUST HAVE AN ADULT TO DRIVE YOU HOME AND BE WITH YOU FOR 24 HOURS. YOU MAY GO HOME BY TAXI OR UBER OR ORTHERWISE, BUT AN ADULT MUST ACCOMPANY YOU HOME AND STAY WITH YOU FOR 24 HOURS.  Name and phone number of your driver:  Special Instructions: N/A              Please read over the following fact sheets you were given: _____________________________________________________________________             [facility] - Preparing for Surgery Before surgery, you can play an important role.  Because skin is not sterile, your skin needs to be as free of germs as possible.  You can reduce the number of germs on your skin by washing with CHG (  chlorahexidine gluconate) soap before surgery.  CHG is an antiseptic cleaner  which kills germs and bonds with the skin to continue killing germs even after washing. Please DO NOT use if you have an allergy to CHG or antibacterial soaps.  If your skin becomes reddened/irritated stop using the CHG and inform your nurse when you arrive at Short Stay. Do not shave (including legs and underarms) for at least 48 hours prior to the first CHG shower.  You may shave your face/neck. Please follow these instructions carefully:  1.  Shower with CHG Soap the night before surgery and the  morning of Surgery.  2.  If you choose to wash your hair, wash your hair first as usual with your  normal  shampoo.  3.  After you shampoo, rinse your hair and body thoroughly to remove the  shampoo.                           4.  Use CHG as you would any other liquid soap.  You can apply chg directly  to the skin and wash                       Gently with a scrungie or clean washcloth.  5.  Apply the CHG Soap to your body ONLY FROM THE NECK DOWN.   Do not use on face/ open                           Wound or open sores. Avoid contact with eyes, ears mouth and genitals (private parts).                       Wash face,  Genitals (private parts) with your normal soap.             6.  Wash thoroughly, paying special attention to the area where your surgery  will be performed.  7.  Thoroughly rinse your body with warm water from the neck down.  8.  DO NOT shower/wash with your normal soap after using and rinsing off  the CHG Soap.                9.  Pat yourself dry with a clean towel.            10.  Wear clean pajamas.            11.  Place clean sheets on your bed the night of your first shower and do not  sleep with pets. Day of Surgery : Do not apply any lotions/deodorants the morning of surgery.  Please wear clean clothes to the hospital/surgery center.  FAILURE TO FOLLOW THESE INSTRUCTIONS MAY RESULT IN THE CANCELLATION OF YOUR SURGERY PATIENT SIGNATURE_________________________________  NURSE  SIGNATURE__________________________________  ________________________________________________________________________

## 2019-08-15 NOTE — Progress Notes (Signed)
Anesthesia Chart Review   Case: [id number] Date/Time: 08/21/19 1525   Procedure: CYSTOSCOPY/RETROGRADE/URETEROSCOPY/ POSSIBLE LEFT BIOPSY (Left )   Anesthesia type: General   Pre-op diagnosis: LEFT RENAL NEOPLASM   Location: [facility] ROOM 03 / [facility]   Surgeons: Raynelle Bring, MD      DISCUSSION:73 y.o. never smoker with h/o PONV, HTN, CAD (DES 2017, CABG x3), s/p AV replacement 04/2018, asthma, sleep apnea, left renal neoplasm scheduled for above procedure 08/21/2019 with Dr. Raynelle Bring.   Last seen by cardiologist, Dr. Sanda Klein, 04/07/2019.  Per OV pt asymptomatic without angina or heart failure symptoms.  Echo 02/21/2019 with good aortic prosthesis gradients.  1 year follow up recommended.   Discussed with Dr. Smith Robert.  Anticipate pt can proceed with planned procedure barring acute status change.   VS: There were no vitals taken for this visit.  PROVIDERS: [name], MD is PCP   [name], Dani Gobble, MD is Cardiologist  LABS: Labs reviewed: Acceptable for surgery. (all labs ordered are listed, but only abnormal results are displayed)  Labs Reviewed - No data to display   IMAGES:   EKG: 04/07/2019 Rate 69 bpm  Sinus rhythm with sinus arrhythmia with 1st degree AV block with occasional premature ventricular complexes   CV: Echo 02/21/2019 IMPRESSIONS    1. Left ventricular ejection fraction, by visual estimation, is 55 to  60%. The left ventricle has normal function. There is mildly increased  left ventricular hypertrophy.  2. Left ventricular diastolic parameters are consistent with Grade I  diastolic dysfunction (impaired relaxation).  3. Global right ventricle has normal systolic function.The right  ventricular size is normal. No increase in right ventricular wall  thickness.  4. Left atrial size was normal.  5. Right atrial size was normal.  6. The mitral valve is normal in structure. Trace mitral valve  regurgitation. No evidence of mitral stenosis.   7. The tricuspid valve is normal in structure. Tricuspid valve  regurgitation is mild.  8. The aortic valve is normal in structure. Aortic valve regurgitation is  not visualized. No evidence of aortic valve sclerosis or stenosis.  9. The pulmonic valve was normal in structure. Pulmonic valve  regurgitation is not visualized.  10. Mildly elevated pulmonary artery systolic pressure.  11. The inferior vena cava is normal in size with greater than 50%  respiratory variability, suggesting right atrial pressure of 3 mmHg.  Past Medical History:  Diagnosis Date  . Arthritis    mild hands  . Asthma    pt denies 4/21   . Chest pain 07/25/2012  . Coronary artery disease   . Dysrhythmia    PAC'S, hx of atrial fib after OHS 04/2018  . ED (erectile dysfunction)   . H/O hiatal hernia   . Heart murmur   . Hypercholesterolemia   . Hypertension    MARGINALLY HIGH - NO MEDS -MONITORS  . Myocardial infarction ([place])    2017   . Pneumonia    hx of 2018   . PONV (postoperative nausea and vomiting)   . Prostate cancer ([place]) 03/27/2011   prostate bx=adenocarcinoma,  . Skin cancer 2008   basal cell face /removed  . Sleep apnea    borderline sleep apnea per patient no devices     Past Surgical History:  Procedure Laterality Date  . AORTIC VALVE REPLACEMENT N/A 04/29/2018   Procedure: AORTIC VALVE REPLACEMENT (AVR) using INSPIRIS RESILIA  AORTIC VALVE 36mm;  Surgeon: Ivin Poot, MD;  Location: [place];  Service:  Open Heart Surgery;  Laterality: N/A;  . basal cell cancer  2008   inside nose  . CARDIAC CATHETERIZATION N/A 06/03/2015   Procedure: Left Heart Cath and Coronary Angiography;  Surgeon: [name] Sine, MD;  Location: [place] [facility];  Service: Cardiovascular;  Laterality: N/A;  . CATARACT EXTRACTION, BILATERAL  2007  . CORONARY ARTERY BYPASS GRAFT N/A 04/29/2018   Procedure: CORONARY ARTERY BYPASS GRAFTING (CABG) X 3; Using Left Internal Mammary Artery (LIMA) and Right Leg Greater  Saphenous Vein Graft (SVG);  LIMA to LAD, SVG to OM1, SVG to RCA,;  Surgeon: Ivin Poot, MD;  Location: [place];  Service: Open Heart Surgery;  Laterality: N/A;  . RIGHT/LEFT HEART CATH AND CORONARY ANGIOGRAPHY N/A 04/26/2018   Procedure: RIGHT/LEFT HEART CATH AND CORONARY ANGIOGRAPHY;  Surgeon: [name] Sine, MD;  Location: [place] [facility];  Service: Cardiovascular;  Laterality: N/A;  . ROBOT ASSISTED LAPAROSCOPIC RADICAL PROSTATECTOMY  08/10/2011   Procedure: ROBOTIC ASSISTED LAPAROSCOPIC RADICAL PROSTATECTOMY LEVEL 2;  Surgeon: Dutch Gray, MD;  Location[facility];  Service: Urology;  Laterality: N/A;  . TEE WITHOUT CARDIOVERSION N/A 04/29/2018   Procedure: TRANSESOPHAGEAL ECHOCARDIOGRAM (TEE);  Surgeon: Prescott Gum, Collier Salina, MD;  Location: [place];  Service: Open Heart Surgery;  Laterality: N/A;  . THYROID SURGERY  infant   uncertain details; no thyroid replacement listed    MEDICATIONS: . aspirin 81 MG EC tablet  . atorvastatin (LIPITOR) 80 MG tablet  . chlorhexidine (PERIDEX) 0.12 % solution  . Cholecalciferol (VITAMIN D-3) 125 MCG (5000 UT) TABS  . Coenzyme Q10 (COQ10) 200 MG CAPS  . metoprolol succinate (TOPROL-XL) 100 MG 24 hr tablet  . nitroGLYCERIN (NITROSTAT) 0.4 MG SL tablet  . vitamin E 400 UNIT capsule   No current facility-administered medications for this encounter.   Maia Plan [facility] Pre-Surgical Testing [phone] 08/18/19  9:12 AM

## 2019-08-15 NOTE — Progress Notes (Signed)
Anesthesia Review:  PCP: Cardiologist :DR Croituri  LOV- 04/07/2019  Chest x-ray : EKG :04/07/19  Echo :02/21/19  Cardiac Cath : 04/26/18  Sleep Study/ CPAP : Fasting Blood Sugar :      / Checks Blood Sugar -- times a day:   Blood Thinner/ Instructions /Last Dose: ASA / Instructions/ Last Dose :   Patient denies shortness of breath, chest pain, fever, and cough at this phone interview.

## 2019-08-17 ENCOUNTER — Other Ambulatory Visit (HOSPITAL_COMMUNITY)
Admission: RE | Admit: 2019-08-17 | Discharge: 2019-08-17 | Disposition: A | Discharge status: Home / Self Care | Payer: PPO | Source: Ambulatory Visit | Attending: Urology | Admitting: Urology

## 2019-08-17 DIAGNOSIS — Z01812 Encounter for preprocedural laboratory examination: Secondary | ICD-10-CM | POA: Insufficient documentation

## 2019-08-17 DIAGNOSIS — N5201 Erectile dysfunction due to arterial insufficiency: Secondary | ICD-10-CM | POA: Diagnosis not present

## 2019-08-17 DIAGNOSIS — Z20822 Contact with and (suspected) exposure to covid-19: Secondary | ICD-10-CM | POA: Diagnosis not present

## 2019-08-17 DIAGNOSIS — R8271 Bacteriuria: Secondary | ICD-10-CM | POA: Diagnosis not present

## 2019-08-17 LAB — SARS CORONAVIRUS 2 (TAT 6-24 HRS): SARS Coronavirus 2: NEGATIVE

## 2019-08-18 NOTE — H&P (Signed)
Office Visit Report     08/01/2019   --------------------------------------------------------------------------------   Lawrence Hall  [id number]  DOB: Sep 02, 1945, 74 year old Male  SSN: [id number]   PRIMARY CARE:  Rachell Cipro, MD  REFERRING:  Raynelle Bring, Eduardo Osier  PROVIDER:  Raynelle Bring, M.D.  LOCATION:  [facility] [phone]     --------------------------------------------------------------------------------   CC/HPI: Left renal neoplasm   Mr. Lawrence Hall returns today after having undergone a CT scan of his abdomen and pelvis for further evaluation of his hematuria. Unfortunately, this did reveal a 6 cm infiltrative left upper pole renal mass concerning for renal cell carcinoma vs. urothelial carcinoma. In addition, he was noted to have a 9 mm pancreatic lesion of undetermined significance. He also was noted to have a disc protrusion of his lumbar spine raising possible concern for tumor although no lesion was clearly identified. He also was noted to have an 11 mm left renal hilar lymph node. He returns today and states that he has had intermittent gross hematuria although this has cleared relatively quickly. He denies any new flank pain or other complications/symptoms. His prior cystoscopy was negative for obvious pathology.     ALLERGIES: No Allergies    MEDICATIONS: Aspirin PO Daily  Lipitor PO Daily  METOPROLOL SUCCINATE PO Daily  Vitamin B12  VITAMIN D3 PO Daily  VITAMIN E PO Daily     GU PSH: Biopsy Skin Lesion - 2012 Cystoscopy - 07/14/2019 Laparoscopy; Lymphadenectomy - 2013 Locm 300-399Mg /Ml Iodine,1Ml - 07/26/2019       PSH Notes: Cath Stent Placement, Prostatectomy Robotic-Assisted, Laparoscopy With Bilateral Total Pelvic Lymphadenectomy, Biopsy Skin   NON-GU PSH: Cataract Surgery.. - about 2014 Coronary Artery Bypass Grafting - about 04/20/2018 Heart Stents - about 2017     GU PMH: Microscopic hematuria - 07/14/2019 Acute Cystitis/UTI  - 05/03/2019 Stress Incontinence, Male stress incontinence - 2017 ED due to arterial insufficiency, Erectile dysfunction due to arterial insufficiency - 2012 Prostate Cancer, Prostate cancer - 2012      PMH Notes:   1) Prostate cancer: He is s/p a BNS RAL radical prostatectomy on 08/10/11. His PSA has been undetectable since surgery.   Diagnosis: pT2c N0 Mx, Gleason 3+4=7 adenocarcinoma with negative surgical margins  Pretreatment PSA: 8.36  Pretreatment SHIM: 16   2) Hematuria:     NON-GU PMH: Bacteriuria - 05/03/2019 Myocardial Infarction, History of myocardial infarction - 2017 Asthma, Asthma - 2014 Arrhythmia Atrial Fibrillation Cardiac murmur, unspecified Heart disease, unspecified Hypercholesterolemia Hypertension    FAMILY HISTORY: Cancer - Runs In Family malignant neoplasm - Runs In Family   SOCIAL HISTORY: Marital Status: Married Preferred Language: English; Ethnicity: Not Hispanic Or Latino; Race: White Current Smoking Status: Patient has never smoked.  Does not use smokeless tobacco. Has not drank since 04/30/1999. Had 2 drinks per week. Drinks 1 caffeinated drink per day. Has not had a blood transfusion. Patient's occupation Theme park manager.     Notes: Alcohol Use, Marital History - Currently Married, Occupation:, Never A Smoker   REVIEW OF SYSTEMS:    GU Review Male:   Patient denies frequent urination, hard to postpone urination, burning/ pain with urination, get up at night to urinate, leakage of urine, stream starts and stops, trouble starting your streams, and have to strain to urinate .  Gastrointestinal (Upper):   Patient denies nausea and vomiting.  Gastrointestinal (Lower):   Patient denies diarrhea and constipation.  Constitutional:   Patient denies fever, night sweats, weight  loss, and fatigue.  Skin:   Patient denies skin rash/ lesion and itching.  Eyes:   Patient denies blurred vision and double vision.  Ears/ Nose/ Throat:   Patient  denies sore throat and sinus problems.  Hematologic/Lymphatic:   Patient denies swollen glands and easy bruising.  Cardiovascular:   Patient denies leg swelling and chest pains.  Respiratory:   Patient denies cough and shortness of breath.  Endocrine:   Patient denies excessive thirst.  Musculoskeletal:   Patient denies back pain and joint pain.  Neurological:   Patient denies headaches and dizziness.  Psychologic:   Patient denies depression and anxiety.   VITAL SIGNS:      08/01/2019 11:06 AM  Weight 201 lb / 91.17 kg  Height 72 in / 182.88 cm  BP 142/79 mmHg  Pulse 73 /min  Temperature 98.0 F / 36.6 C  BMI 27.3 kg/m   MULTI-SYSTEM PHYSICAL EXAMINATION:    Constitutional: Well-nourished. No physical deformities. Normally developed. Good grooming.  Respiratory: No labored breathing, no use of accessory muscles.   Cardiovascular: Normal temperature, normal extremity pulses, no swelling, no varicosities.  Gastrointestinal: No mass, no tenderness, no rigidity, non obese abdomen.      Complexity of Data:  Records Review:   Previous Patient Records  X-Ray Review: C.T. Abdomen/Pelvis: Reviewed Films.     05/05/19 06/20/15 06/19/14 10/26/13 04/25/13 09/07/12 03/02/12 09/29/11  PSA  Total PSA <0.015 ng/mL <0.01  <0.01  <0.01  <0.01  <0.01  <0.01  <0.01    Notes:                     CLINICAL DATA: Microhematuria and history of gross hematuria.  Intermittent episodes with history of prostate cancer as well in  2013.   EXAM:  CT ABDOMEN AND PELVIS WITHOUT AND WITH CONTRAST   TECHNIQUE:  Multidetector CT imaging of the abdomen and pelvis was performed  following the standard protocol before and following the bolus  administration of intravenous contrast.   CONTRAST: 125 cc Omnipaque 300   COMPARISON: Chest CT of 04/27/2018   FINDINGS:  Lower chest: Lung bases are clear. No pleural or pericardial  effusion. Post sternotomy.   Hepatobiliary: The imaging of the liver is  unremarkable with patent  portal vein. A numerous gallstones in the gallbladder without  pericholecystic stranding or biliary ductal distension.   Pancreas: Subtle pancreatic lesion in the pancreatic head measures  approximately 9 x 9 mm (image 36, series 5)   Spleen: Spleen is normal size without focal lesion.   Adrenals/Urinary Tract: Periadrenal stranding on the left with  contact of stranding and fascial thickening extending from an  infiltrative mass in the left kidney upper pole, obliterating the  sinus fat with thickening of the septa of [place].   Mass measuring 6.1 x 6.0 x 5.0 cm. No ureteral enhancement is noted.  The urinary bladder is decompressed. Changes of prostatectomy are  noted. Small low-density lesion in the lower pole right kidney less  than a cm, likely a small cyst.   On delayed imaging the collecting systems of the left upper polar  obliterated. Urinary bladder is under distended limiting assessment.   Fascial thickening along the left lateral margin of the kidney  without frank nodularity.   Stomach/Bowel: Stomach is under distended. Small bowel is normal in  caliber. The appendix is normal. Colon is stool filled.   Vascular/Lymphatic: Left-sided inferior vena cava draining into  renal vein on the left and  crossing the midline., this is the sole  venous return for lower extremities. No signs of renal venous  extension.   Calcified and noncalcified plaque is moderate to marked and worse in  the right common iliac artery. No adenopathy in the retroperitoneum.  11 mm left renal hilar lymph node.   No pelvic adenopathy.   Reproductive: Post prostatectomy.   Other: No ascites. No hernia.   Musculoskeletal: Spinal degenerative changes. With may be a large  Schmorl's node at the L4 vertebral level measuring 13 mm associated  with discogenic degenerative changes. Some atypical features are the  more rounded appearance in the central portion of the  vertebral  body.   IMPRESSION:  1. Large infiltrative mass of the upper pole the left kidney  suspicious for infiltrative renal cell carcinoma versus transitional  cell carcinoma.  2. No renal venous invasion but with mild enlargement of a left  hilar lymph node, 11 mm (image 32, series 5)  3. Subtle 9 x 9 mm lesion in the pancreatic head may represent a  small metastatic lesion to the pancreas versus primary pancreatic  neoplasm. Endoscopic assessment may be helpful.  4. Direct extension to the left adrenal gland is suggested on  coronal image 88, series 604.  5. Left-sided inferior vena cava with short left renal vein, no  extension of tumor into main renal vein..  6. Atypical appearing area of potential disc herniation in the L4  vertebral body. Given other findings above spinal MRI may be helpful  to exclude the presence of a metastatic lesion.  7. Post prostatectomy changes.  8. Moderate to marked atherosclerotic disease, worse in the right  common iliac artery.    Electronically Signed  By: Zetta Bills M.D.  On: 07/26/2019 13:51   PROCEDURES:          Urinalysis w/Scope Dipstick Dipstick Cont'd Micro  Color: Red Bilirubin: Neg mg/dL WBC/hpf: 10 - 20/hpf  Appearance: Turbid Ketones: Trace mg/dL RBC/hpf: Packed/hpf  Specific Gravity: 1.020 Blood: 3+ ery/uL Bacteria: Many (>50/hpf)  pH: 6.0 Protein: 3+ mg/dL Cystals: NS (Not Seen)  Glucose: Neg mg/dL Urobilinogen: 0.2 mg/dL Casts: NS (Not Seen)    Nitrites: Neg Trichomonas: Not Present    Leukocyte Esterase: 2+ leu/uL Mucous: Not Present      Epithelial Cells: NS (Not Seen)      Yeast: NS (Not Seen)      Sperm: Not Present    ASSESSMENT:      ICD-10 Details  2 GU:   Left renal neoplasm - D49.512   1 NON-GU:   Bacteriuria - R82.71    PLAN:           Orders Labs Urine Culture, CBC with Diff, CMP          Schedule X-Rays: 1 Week - C.T. Chest With I.V. Contrast  Return Visit/Planned Activity: Other See Visit  Notes             Note: Will call to schedule surgery.          Document Letter(s):  Created for Patient: Clinical Summary         Notes:   1. Left renal neoplasm: We discussed his CT finds today including the fact that this could represent either urothelial carcinoma or infiltrative renal cell carcinoma. I have recommended that he proceed with further metastatic staging including a CT scan of the chest and laboratory evaluation. He also will be scheduled for cystoscopy, left retrograde pyelography, possible left ureteroscopy with  biopsy, and possible left ureteral stent to help determine the tumor type.   2. 9 mm pancreatic lesion: This is fairly indeterminate and will at least require further imaging surveillance or follow-up. I will arrange appropriate follow-up after he undergoes further evaluation for his renal mass and possibly treatment. If this did represent a metastatic lesion, I do not think this would change his indication for surgery considering he is symptomatic.   3. Left renal hilar lymph node: If he does have urothelial carcinoma, it may be beneficial for him to consider neoadjuvant cisplatin based chemotherapy. We will discuss this further following his upcoming evaluation.   4. Atypical disc herniation in the lumbar spine: We will also discuss whether would be worthwhile to consider further evaluation of his lumbar spine with an MRI following his upcoming evaluation.   5. Prostate cancer: His PSA has remained undetectable. His next PSA would be due for January of 2022.   Cc: Dr. Rachell Cipro  Dr. Dani Gobble Croituru         Next Appointment:      Next Appointment: 08/10/2019 09:15 AM    Appointment Type: CT Scan    Location: [facility] [phone]    Provider: CT CT    Reason for Visit: ct chest w/Faiza Bansal/left renal neoplasm      * Signed by Raynelle Bring, M.D. on 08/01/19 at 10:11 PM (EDT)*

## 2019-08-21 ENCOUNTER — Encounter (HOSPITAL_COMMUNITY)
Admission: RE | Disposition: A | Discharge status: Home / Self Care | Payer: Self-pay | Source: Home / Self Care | Attending: Urology

## 2019-08-21 ENCOUNTER — Ambulatory Visit (HOSPITAL_COMMUNITY)
Admission: RE | Admit: 2019-08-21 | Discharge: 2019-08-21 | Disposition: A | Discharge status: Home / Self Care | Payer: PPO | Attending: Urology | Admitting: Urology

## 2019-08-21 ENCOUNTER — Ambulatory Visit (HOSPITAL_COMMUNITY): Payer: PPO

## 2019-08-21 ENCOUNTER — Encounter (HOSPITAL_COMMUNITY): Payer: Self-pay | Admitting: Urology

## 2019-08-21 ENCOUNTER — Ambulatory Visit (HOSPITAL_COMMUNITY): Payer: PPO | Admitting: Physician Assistant

## 2019-08-21 ENCOUNTER — Other Ambulatory Visit: Payer: Self-pay

## 2019-08-21 ENCOUNTER — Ambulatory Visit (HOSPITAL_COMMUNITY): Payer: PPO | Admitting: Certified Registered Nurse Anesthetist

## 2019-08-21 DIAGNOSIS — C652 Malignant neoplasm of left renal pelvis: Secondary | ICD-10-CM | POA: Insufficient documentation

## 2019-08-21 DIAGNOSIS — D49512 Neoplasm of unspecified behavior of left kidney: Secondary | ICD-10-CM | POA: Diagnosis not present

## 2019-08-21 DIAGNOSIS — Z7982 Long term (current) use of aspirin: Secondary | ICD-10-CM | POA: Insufficient documentation

## 2019-08-21 DIAGNOSIS — E785 Hyperlipidemia, unspecified: Secondary | ICD-10-CM | POA: Diagnosis not present

## 2019-08-21 DIAGNOSIS — Z79899 Other long term (current) drug therapy: Secondary | ICD-10-CM | POA: Insufficient documentation

## 2019-08-21 DIAGNOSIS — I252 Old myocardial infarction: Secondary | ICD-10-CM | POA: Diagnosis not present

## 2019-08-21 DIAGNOSIS — J45909 Unspecified asthma, uncomplicated: Secondary | ICD-10-CM | POA: Diagnosis not present

## 2019-08-21 DIAGNOSIS — Z951 Presence of aortocoronary bypass graft: Secondary | ICD-10-CM | POA: Diagnosis not present

## 2019-08-21 DIAGNOSIS — C642 Malignant neoplasm of left kidney, except renal pelvis: Secondary | ICD-10-CM | POA: Diagnosis not present

## 2019-08-21 DIAGNOSIS — Z955 Presence of coronary angioplasty implant and graft: Secondary | ICD-10-CM | POA: Insufficient documentation

## 2019-08-21 DIAGNOSIS — E7800 Pure hypercholesterolemia, unspecified: Secondary | ICD-10-CM | POA: Insufficient documentation

## 2019-08-21 DIAGNOSIS — I2510 Atherosclerotic heart disease of native coronary artery without angina pectoris: Secondary | ICD-10-CM | POA: Diagnosis not present

## 2019-08-21 DIAGNOSIS — Z8546 Personal history of malignant neoplasm of prostate: Secondary | ICD-10-CM | POA: Insufficient documentation

## 2019-08-21 DIAGNOSIS — N2889 Other specified disorders of kidney and ureter: Secondary | ICD-10-CM | POA: Diagnosis not present

## 2019-08-21 DIAGNOSIS — I10 Essential (primary) hypertension: Secondary | ICD-10-CM | POA: Insufficient documentation

## 2019-08-21 DIAGNOSIS — I214 Non-ST elevation (NSTEMI) myocardial infarction: Secondary | ICD-10-CM | POA: Diagnosis not present

## 2019-08-21 HISTORY — PX: CYSTOSCOPY/RETROGRADE/URETEROSCOPY: SHX5316

## 2019-08-21 SURGERY — CYSTOSCOPY/RETROGRADE/URETEROSCOPY
Anesthesia: General | Laterality: Left

## 2019-08-21 MED ORDER — LIDOCAINE 2% (20 MG/ML) 5 ML SYRINGE
INTRAMUSCULAR | Status: AC
  Filled 2019-08-21: qty 5

## 2019-08-21 MED ORDER — LIDOCAINE 2% (20 MG/ML) 5 ML SYRINGE
INTRAMUSCULAR | Status: DC | PRN
  Administered 2019-08-21: 80 mg via INTRAVENOUS

## 2019-08-21 MED ORDER — FENTANYL CITRATE (PF) 100 MCG/2ML IJ SOLN
INTRAMUSCULAR | Status: DC | PRN
  Administered 2019-08-21: 50 ug via INTRAVENOUS
  Administered 2019-08-21 (×2): 25 ug via INTRAVENOUS

## 2019-08-21 MED ORDER — IOHEXOL 300 MG/ML  SOLN
INTRAMUSCULAR | Status: DC | PRN
  Administered 2019-08-21: 16:00:00 13 mL

## 2019-08-21 MED ORDER — PHENYLEPHRINE 40 MCG/ML (10ML) SYRINGE FOR IV PUSH (FOR BLOOD PRESSURE SUPPORT)
PREFILLED_SYRINGE | INTRAVENOUS | Status: DC | PRN
  Administered 2019-08-21 (×2): 80 ug via INTRAVENOUS
  Administered 2019-08-21 (×4): 40 ug via INTRAVENOUS
  Administered 2019-08-21: 80 ug via INTRAVENOUS
  Administered 2019-08-21 (×3): 40 ug via INTRAVENOUS

## 2019-08-21 MED ORDER — PROPOFOL 10 MG/ML IV BOLUS
INTRAVENOUS | Status: AC
  Filled 2019-08-21: qty 20

## 2019-08-21 MED ORDER — TRAMADOL HCL 50 MG PO TABS: 50.0000 mg | ORAL_TABLET | Freq: Four times a day (QID) | ORAL | 0 refills | Status: DC | PRN

## 2019-08-21 MED ORDER — LACTATED RINGERS IV SOLN: INTRAVENOUS | Status: DC

## 2019-08-21 MED ORDER — ONDANSETRON HCL 4 MG/2ML IJ SOLN
INTRAMUSCULAR | Status: AC
  Filled 2019-08-21: qty 2

## 2019-08-21 MED ORDER — SODIUM CHLORIDE 0.9 % IR SOLN
Status: DC | PRN
  Administered 2019-08-21: 3000 mL

## 2019-08-21 MED ORDER — DEXAMETHASONE SODIUM PHOSPHATE 10 MG/ML IJ SOLN
INTRAMUSCULAR | Status: DC | PRN
  Administered 2019-08-21: 10 mg via INTRAVENOUS

## 2019-08-21 MED ORDER — PROPOFOL 10 MG/ML IV BOLUS
INTRAVENOUS | Status: DC | PRN
  Administered 2019-08-21: 150 mg via INTRAVENOUS
  Administered 2019-08-21: 50 mg via INTRAVENOUS

## 2019-08-21 MED ORDER — FENTANYL CITRATE (PF) 100 MCG/2ML IJ SOLN: 25.0000 ug | INTRAMUSCULAR | Status: DC | PRN

## 2019-08-21 MED ORDER — FENTANYL CITRATE (PF) 100 MCG/2ML IJ SOLN
INTRAMUSCULAR | Status: AC
  Filled 2019-08-21: qty 2

## 2019-08-21 MED ORDER — SODIUM CHLORIDE 0.9 % IV SOLN
2.0000 g | Freq: Once | INTRAVENOUS | Status: AC
  Administered 2019-08-21: 2 g via INTRAVENOUS
  Filled 2019-08-21: qty 20

## 2019-08-21 MED ORDER — ONDANSETRON HCL 4 MG/2ML IJ SOLN
INTRAMUSCULAR | Status: DC | PRN
  Administered 2019-08-21: 4 mg via INTRAVENOUS

## 2019-08-21 SURGICAL SUPPLY — 26 items
BAG URO CATCHER STRL LF (MISCELLANEOUS) ×3 IMPLANT
BASKET STNLS GEMINI 4WIRE 3FR (BASKET) ×2 IMPLANT
BASKET ZERO TIP NITINOL 2.4FR (BASKET) IMPLANT
BRUSH URET BIOPSY 3F (UROLOGICAL SUPPLIES) ×2 IMPLANT
CATH INTERMIT  6FR 70CM (CATHETERS) ×2 IMPLANT
CLOTH BEACON ORANGE TIMEOUT ST (SAFETY) ×3 IMPLANT
CNTNR URN SCR LID CUP LEK RST (MISCELLANEOUS) IMPLANT
CONT SPEC 4OZ STRL OR WHT (MISCELLANEOUS) ×2
FIBER LASER FLEXIVA 365 (UROLOGICAL SUPPLIES) IMPLANT
FIBER LASER TRAC TIP (UROLOGICAL SUPPLIES) IMPLANT
GLOVE BIOGEL M STRL SZ7.5 (GLOVE) ×3 IMPLANT
GOWN STRL REUS W/TWL LRG LVL3 (GOWN DISPOSABLE) ×6 IMPLANT
GUIDEWIRE ANG ZIPWIRE 038X150 (WIRE) IMPLANT
GUIDEWIRE STR DUAL SENSOR (WIRE) ×3 IMPLANT
IV NS 1000ML (IV SOLUTION)
IV NS 1000ML BAXH (IV SOLUTION) ×1 IMPLANT
KIT TURNOVER KIT A (KITS) IMPLANT
MANIFOLD NEPTUNE II (INSTRUMENTS) ×3 IMPLANT
PAD TELFA 2X3 NADH STRL (GAUZE/BANDAGES/DRESSINGS) ×2 IMPLANT
SHEATH URETERAL 12FRX35CM (MISCELLANEOUS) ×2 IMPLANT
STENT URET 6FRX26 CONTOUR (STENTS) ×2 IMPLANT
SYR 10ML LL (SYRINGE) ×2 IMPLANT
TRAY CYSTO PACK (CUSTOM PROCEDURE TRAY) ×3 IMPLANT
TUBING CONNECTING 10 (TUBING) ×2 IMPLANT
TUBING CONNECTING 10' (TUBING) ×1
TUBING UROLOGY SET (TUBING) ×3 IMPLANT

## 2019-08-21 NOTE — Discharge Instructions (Signed)
1. You may see some blood in the urine and may have some burning with urination for 48-72 hours. You also may notice that you have to urinate more frequently or urgently after your procedure which is normal.  2. You should call should you develop an inability urinate, fever > 101, persistent nausea and vomiting that prevents you from eating or drinking to stay hydrated.  3. If you have a stent, you will likely urinate more frequently and urgently until the stent is removed and you may experience some discomfort/pain in the lower abdomen and flank especially when urinating. You may take pain medication prescribed to you if needed for pain. You may also intermittently have blood in the urine until the stent is removed.        General Anesthesia, Adult, Care After This sheet gives you information about how to care for yourself after your procedure. Your health care provider may also give you more specific instructions. If you have problems or questions, contact your health care provider. What can I expect after the procedure? After the procedure, the following side effects are common:  Pain or discomfort at the IV site.  Nausea.  Vomiting.  Sore throat.  Trouble concentrating.  Feeling cold or chills.  Weak or tired.  Sleepiness and fatigue.  Soreness and body aches. These side effects can affect parts of the body that were not involved in surgery. Follow these instructions at home:  For at least 24 hours after the procedure:  Have a responsible adult stay with you. It is important to have someone help care for you until you are awake and alert.  Rest as needed.  Do not: ? Participate in activities in which you could fall or become injured. ? Drive. ? Use heavy machinery. ? Drink alcohol. ? Take sleeping pills or medicines that cause drowsiness. ? Make important decisions or sign legal documents. ? Take care of children on your own. Eating and drinking  Follow any  instructions from your health care provider about eating or drinking restrictions.  When you feel hungry, start by eating small amounts of foods that are soft and easy to digest (bland), such as toast. Gradually return to your regular diet.  Drink enough fluid to keep your urine pale yellow.  If you vomit, rehydrate by drinking water, juice, or clear broth. General instructions  If you have sleep apnea, surgery and certain medicines can increase your risk for breathing problems. Follow instructions from your health care provider about wearing your sleep device: ? Anytime you are sleeping, including during daytime naps. ? While taking prescription pain medicines, sleeping medicines, or medicines that make you drowsy.  Return to your normal activities as told by your health care provider. Ask your health care provider what activities are safe for you.  Take over-the-counter and prescription medicines only as told by your health care provider.  If you smoke, do not smoke without supervision.  Keep all follow-up visits as told by your health care provider. This is important. Contact a health care provider if:  You have nausea or vomiting that does not get better with medicine.  You cannot eat or drink without vomiting.  You have pain that does not get better with medicine.  You are unable to pass urine.  You develop a skin rash.  You have a fever.  You have redness around your IV site that gets worse. Get help right away if:  You have difficulty breathing.  You have chest pain.    You have blood in your urine or stool, or you vomit blood. Summary  After the procedure, it is common to have a sore throat or nausea. It is also common to feel tired.  Have a responsible adult stay with you for the first 24 hours after general anesthesia. It is important to have someone help care for you until you are awake and alert.  When you feel hungry, start by eating small amounts of foods  that are soft and easy to digest (bland), such as toast. Gradually return to your regular diet.  Drink enough fluid to keep your urine pale yellow.  Return to your normal activities as told by your health care provider. Ask your health care provider what activities are safe for you. This information is not intended to replace advice given to you by your health care provider. Make sure you discuss any questions you have with your health care provider. Document Revised: 04/09/2017 Document Reviewed: 11/20/2016 Elsevier Patient Education  2020 [employer]  

## 2019-08-21 NOTE — Anesthesia Preprocedure Evaluation (Signed)
Anesthesia Evaluation  Patient identified by MRN, date of birth, ID band Patient awake    Reviewed: Allergy & Precautions, NPO status , Patient's Chart, lab work & pertinent test results  History of Anesthesia Complications (+) PONV  Airway Mallampati: II  TM Distance: >3 FB Neck ROM: Full    Dental no notable dental hx.    Pulmonary neg pulmonary ROS,    Pulmonary exam normal breath sounds clear to auscultation       Cardiovascular hypertension, + CAD, + Past MI and + CABG  Normal cardiovascular exam+ dysrhythmias Atrial Fibrillation  Rhythm:Regular Rate:Normal     Neuro/Psych negative neurological ROS  negative psych ROS   GI/Hepatic negative GI ROS, Neg liver ROS,   Endo/Other  negative endocrine ROS  Renal/GU negative Renal ROS  negative genitourinary   Musculoskeletal negative musculoskeletal ROS (+)   Abdominal   Peds negative pediatric ROS (+)  Hematology negative hematology ROS (+)   Anesthesia Other Findings   Reproductive/Obstetrics negative OB ROS                             Anesthesia Physical Anesthesia Plan  ASA: III  Anesthesia Plan: General   Post-op Pain Management:    Induction: Intravenous  PONV Risk Score and Plan: 3 and Ondansetron, Dexamethasone and Treatment may vary due to age or medical condition  Airway Management Planned: LMA  Additional Equipment:   Intra-op Plan:   Post-operative Plan: Extubation in OR  Informed Consent: I have reviewed the patients History and Physical, chart, labs and discussed the procedure including the risks, benefits and alternatives for the proposed anesthesia with the patient or authorized representative who has indicated his/her understanding and acceptance.     Dental advisory given  Plan Discussed with: CRNA and Surgeon  Anesthesia Plan Comments:         Anesthesia Quick Evaluation

## 2019-08-21 NOTE — Anesthesia Procedure Notes (Signed)
Procedure Name: LMA Insertion Date/Time: 08/21/2019 3:18 PM Performed by: Montel Clock, CRNA Pre-anesthesia Checklist: Patient identified, Emergency Drugs available, Suction available, Patient being monitored and Timeout performed Patient Re-evaluated:Patient Re-evaluated prior to induction Oxygen Delivery Method: Circle system utilized Preoxygenation: Pre-oxygenation with 100% oxygen Induction Type: IV induction LMA: LMA inserted LMA Size: 5.0 Number of attempts: 1 Dental Injury: Teeth and Oropharynx as per pre-operative assessment

## 2019-08-21 NOTE — Op Note (Signed)
Preoperative diagnosis: Left renal mass  Postoperative diagnosis: Left renal mass  Procedures: 1.  Cystoscopy 2.  Left retrograde pyelography with interpretation 3.  Left ureteroscopy 4.  Biopsy of left renal collecting system mass 5.  Brush biopsy of left renal collecting system mass 6.  Left renal washing for cytology 7.  Left ureteral stent placement (6 x 26-no string)  Surgeon: Pryor Curia MD  Anesthesia: General  Complications: None  EBL: Minimal  Specimens: 1.  Biopsy of left renal pelvic mass 2.  Brush biopsy of left renal pelvic mass 3.  Left renal pelvic washing for cytology  Disposition of specimens: Pathology  Intraoperative findings: Left retrograde pyelography was performed with a 6 French ureteral catheter and Omnipaque contrast.  This demonstrated a normal caliber ureter without filling defects or other abnormalities.  The left renal collecting system was normal aside from an amputated upper pole calyx raising concern for a urothelial tumor.  Indication: Lawrence Hall is a 74 year old gentleman who was found to have a left renal mass on an evaluation for microscopic hematuria.  His mass was noted to be centrally located and concerning for a urothelial carcinoma versus renal cell carcinoma.  He presents today for further diagnostic evaluation with the above procedures.  The potential risks, complications, and expected recovery process associated with these procedures was discussed in detail.  Informed consent was obtained.  Description of procedure:  The patient was taken to the operating room and a general anesthetic was administered.  He was given preoperative antibiotics, placed in the dorsolithotomy position, and prepped and draped in usual sterile fashion.  Next, a preoperative timeout was performed.  Cystourethroscopy was then performed with a 22 French cystoscope sheath.  Inspection of the bladder revealed no tumors, stones, or other mucosal pathology.   His prostate was surgically absent consistent with his history of a prior radical prostatectomy.  The urine was noted to be somewhat cloudy consistent with probable blood products.  However, no clear efflux of blood was noted from the left ureteral orifice.  A 6 French ureteral catheter was then used to intubate the left ureteral orifice.  Omnipaque contrast was injected.  Findings are as dictated above.  Considering the amputated upper pole calyx, it was concerning for a possible urothelial tumor.  Therefore a 0.38 sensor guidewire was advanced up the left ureter into the left renal collecting system under fluoroscopic guidance.  A 12/14 ureteral access sheath was advanced over the wire without difficulty into the proximal ureter.  Digital flexible ureteroscopy was then performed.  Inspection of the upper pole collecting system revealed a tumor that was partially papillary and concerning for a urothelial carcinoma.  A washing was then obtained through the ureteroscope for cytology from the left renal pelvis.  Multiple attempts were then made with a Gemini basket to get an adequate biopsy of the tumor although only scant fragments were able to be obtained due to poor visualization related to bleeding from the tumor site.  I also obtained a brush biopsy through the tumor site.  All specimens were sent for permanent pathologic analysis.  A 0.38 sensor guidewire was then left in place as the ureteral access sheath was removed.  The cystoscope was again utilized and the wire was backloaded on the cystoscope.  A 6 x 26 double-J ureteral stent was then advanced over the wire using Seldinger technique and positioned appropriately in the renal pelvis and the bladder under fluoroscopic and cystoscopic guidance.  The bladder was emptied.  The procedure was ended.  He tolerated the procedure well and without complications.  He was able to be awakened and transferred to the recovery unit in satisfactory condition.

## 2019-08-21 NOTE — Anesthesia Postprocedure Evaluation (Signed)
Anesthesia Post Note  Patient: Lawrence Hall  Procedure(s) Performed: CYSTOSCOPY/RETROGRADE/URETEROSCOPY/  LEFT BIOPSY/BRUSH BIOPSY/RENAL WASHINGS/ AND STENT PLACEMENT (Left )     Patient location during evaluation: PACU Anesthesia Type: General Level of consciousness: awake and alert Pain management: pain level controlled Vital Signs Assessment: post-procedure vital signs reviewed and stable Respiratory status: spontaneous breathing, nonlabored ventilation, respiratory function stable and patient connected to nasal cannula oxygen Cardiovascular status: blood pressure returned to baseline and stable Postop Assessment: no apparent nausea or vomiting Anesthetic complications: no    Last Vitals:  Vitals:   08/21/19 1630 08/21/19 1645  BP: (!) 145/78 (!) 155/82  Pulse: 63 (!) 58  Resp: 18 16  Temp:    SpO2: 97% 100%    Last Pain:  Vitals:   08/21/19 1645  PainSc: 0-No pain                 Chanse Kagel S

## 2019-08-21 NOTE — Interval H&P Note (Signed)
History and Physical Interval Note:  08/21/2019 2:30 PM  Lawrence Hall  has presented today for surgery, with the diagnosis of LEFT RENAL NEOPLASM.  The various methods of treatment have been discussed with the patient and family. After consideration of risks, benefits and other options for treatment, the patient has consented to  Procedure(s): CYSTOSCOPY/RETROGRADE/URETEROSCOPY/ POSSIBLE LEFT BIOPSY (Left) as a surgical intervention.  The patient's history has been reviewed, patient examined, no change in status, stable for surgery.  I have reviewed the patient's chart and labs.  Questions were answered to the patient's satisfaction.     [employer]

## 2019-08-21 NOTE — Transfer of Care (Signed)
Immediate Anesthesia Transfer of Care Note  Patient: Lawrence Hall  Procedure(s) Performed: CYSTOSCOPY/RETROGRADE/URETEROSCOPY/  LEFT BIOPSY/BRUSH BIOPSY/RENAL WASHINGS/ AND STENT PLACEMENT (Left )  Patient Location: PACU  Anesthesia Type:General  Level of Consciousness: drowsy and patient cooperative  Airway & Oxygen Therapy: Patient Spontanous Breathing and Patient connected to face mask oxygen  Post-op Assessment: Report given to RN and Post -op Vital signs reviewed and stable  Post vital signs: Reviewed and stable  Last Vitals:  Vitals Value Taken Time  BP    Temp    Pulse 61 08/21/19 1612  Resp 22 08/21/19 1612  SpO2 100 % 08/21/19 1612  Vitals shown include unvalidated device data.  Last Pain:  Vitals:   08/21/19 1432  PainSc: 0-No pain         Complications: No apparent anesthesia complications

## 2019-08-23 LAB — CYTOLOGY - NON PAP

## 2019-08-23 LAB — SURGICAL PATHOLOGY

## 2019-09-05 DIAGNOSIS — C652 Malignant neoplasm of left renal pelvis: Secondary | ICD-10-CM | POA: Diagnosis not present

## 2019-09-05 DIAGNOSIS — R8271 Bacteriuria: Secondary | ICD-10-CM | POA: Diagnosis not present

## 2019-09-06 ENCOUNTER — Other Ambulatory Visit: Payer: Self-pay | Admitting: Urology

## 2019-09-06 DIAGNOSIS — C652 Malignant neoplasm of left renal pelvis: Secondary | ICD-10-CM

## 2019-09-07 ENCOUNTER — Telehealth: Payer: Self-pay | Admitting: Cardiovascular Disease

## 2019-09-07 ENCOUNTER — Telehealth: Payer: Self-pay | Admitting: Oncology

## 2019-09-07 NOTE — Telephone Encounter (Signed)
Received a new pt referral from Dr. Alinda Money [facility] for urothelial carcinoma of the left kidney. Mr. Lawrence Hall has been cld and scheduled to see Dr. Alen Blew on 5/25 at 11am. Pt aware to arrive 15 minutes early.

## 2019-09-07 NOTE — Telephone Encounter (Signed)
New message      [facility] Pre-operative Risk Assessment    HEARTCARE STAFF: - Please ensure there is not already an duplicate clearance open for this procedure - Under Visit Info/Reason for Call, type in Other and utilize the format Clearance MM/DD/YY or Clearance TBD  Request for surgical clearance:  1. What type of surgery is being performed? Left Robot Assisted Lapraroscopic Nephroureterectomy   2. When is this surgery scheduled? TBD  3. What type of clearance is required (medical clearance vs. Pharmacy clearance to hold med vs. Both)? Both  4. Are there any medications that need to be held prior to surgery and how long?Aspirin needs to be withheld 5 days prior  5. Practice name and name of physician performing surgery? [facility], Raynelle Bring  6. What is the office phone number? [place]   7.   What is the office fax number? [phone]   8.   Anesthesia type (None, local, MAC, general) ? general   Lawrence Hall 09/07/2019, 11:00 AM  _________________________________________________________________   (provider comments below)

## 2019-09-11 NOTE — Telephone Encounter (Signed)
Left message for pt to please call Dr. Victorino December office to schedule a pre op clearance appt.

## 2019-09-11 NOTE — Telephone Encounter (Signed)
Primary Cardiologist:Mihai [name], MD  Chart reviewed as part of pre-operative protocol coverage. Because of Lawrence Hall's past medical history and time since last visit, he/she will require a follow-up visit in order to better assess preoperative cardiovascular risk.  Pre-op covering staff: - Please schedule appointment and call patient to inform them. - Please contact requesting surgeon's office via preferred method (i.e, phone, fax) to inform them of need for appointment prior to surgery.  If applicable, this message will also be routed to pharmacy pool and/or primary cardiologist for input on holding anticoagulant/antiplatelet agent as requested below so that this information is available at time of patient's appointment.   Deberah Pelton, NP  09/11/2019, 10:59 AM

## 2019-09-12 ENCOUNTER — Telehealth: Payer: Self-pay | Admitting: Cardiovascular Disease

## 2019-09-12 ENCOUNTER — Inpatient Hospital Stay: Payer: PPO | Attending: Oncology | Admitting: Oncology

## 2019-09-12 ENCOUNTER — Other Ambulatory Visit: Payer: Self-pay

## 2019-09-12 VITALS — BP 138/81 | HR 82 | Temp 98.7°F | Resp 18 | Ht 72.0 in | Wt 197.0 lb

## 2019-09-12 DIAGNOSIS — I10 Essential (primary) hypertension: Secondary | ICD-10-CM | POA: Diagnosis not present

## 2019-09-12 DIAGNOSIS — Z951 Presence of aortocoronary bypass graft: Secondary | ICD-10-CM | POA: Diagnosis not present

## 2019-09-12 DIAGNOSIS — Z803 Family history of malignant neoplasm of breast: Secondary | ICD-10-CM | POA: Diagnosis not present

## 2019-09-12 DIAGNOSIS — C679 Malignant neoplasm of bladder, unspecified: Secondary | ICD-10-CM

## 2019-09-12 DIAGNOSIS — M1990 Unspecified osteoarthritis, unspecified site: Secondary | ICD-10-CM | POA: Diagnosis not present

## 2019-09-12 DIAGNOSIS — R630 Anorexia: Secondary | ICD-10-CM | POA: Insufficient documentation

## 2019-09-12 DIAGNOSIS — Z85828 Personal history of other malignant neoplasm of skin: Secondary | ICD-10-CM | POA: Diagnosis not present

## 2019-09-12 DIAGNOSIS — E7800 Pure hypercholesterolemia, unspecified: Secondary | ICD-10-CM | POA: Diagnosis not present

## 2019-09-12 DIAGNOSIS — C659 Malignant neoplasm of unspecified renal pelvis: Secondary | ICD-10-CM | POA: Insufficient documentation

## 2019-09-12 DIAGNOSIS — Z9079 Acquired absence of other genital organ(s): Secondary | ICD-10-CM | POA: Diagnosis not present

## 2019-09-12 DIAGNOSIS — Z79899 Other long term (current) drug therapy: Secondary | ICD-10-CM | POA: Diagnosis not present

## 2019-09-12 DIAGNOSIS — I2510 Atherosclerotic heart disease of native coronary artery without angina pectoris: Secondary | ICD-10-CM | POA: Insufficient documentation

## 2019-09-12 DIAGNOSIS — C652 Malignant neoplasm of left renal pelvis: Secondary | ICD-10-CM | POA: Insufficient documentation

## 2019-09-12 DIAGNOSIS — Z8546 Personal history of malignant neoplasm of prostate: Secondary | ICD-10-CM | POA: Diagnosis not present

## 2019-09-12 DIAGNOSIS — R634 Abnormal weight loss: Secondary | ICD-10-CM | POA: Diagnosis not present

## 2019-09-12 DIAGNOSIS — I252 Old myocardial infarction: Secondary | ICD-10-CM | POA: Diagnosis not present

## 2019-09-12 NOTE — Progress Notes (Signed)
Reason for the request: Renal pelvis tumor      HPI: I was asked by Dr. Alinda Money to evaluate Mr. Lawrence Hall for the evaluation of recent diagnosis of urothelial carcinoma.  He is a 74 year old man with history of coronary disease as well as history of prostate cancer.  He was diagnosed with Gleason score 3+4 = 7 prostate cancer in 2012.  In 2013 he underwent a radical prostatectomy under the care of Dr. Alinda Money and found to have a Gleason score 3+4 equal 7 involving both lobes without any angiolymphatic invasion or extraprostatic extension with 0 out of 2 lymph nodes involved.  The final pathological staging was [id number].  He continues to have undetectable PSA as of January 2021.  He started developing symptoms of hematuria and imaging studies obtained on April 2021 which was found to have a mass measuring 6.1 x 6.0 x 5.0 cm in the left kidney.  Eleven 9 mm left renal hilar lymph node was detected.  A central line 9 x 9 mm lesion in the pancreatic head that also was worrisome.  No other signs of metastatic disease is noted including CT scan of the chest on August 10, 2019.   Tissue biopsy obtained on Aug 21, 2019.  Large kidney mass showed high-grade urothelial carcinoma.  Clinically, he has reported decline in his appetite and significant weight loss predominantly after his cardiac surgery and bypass operation.  He is not reporting any hematuria, dysuria or frequency.  He does report right arm pain around the site of his Covid vaccine injection.  He  does not report any headaches, blurry vision, syncope or seizures. Does not report any fevers, chills or sweats.  Does not report any cough, wheezing or hemoptysis.  Does not report any chest pain, palpitation, orthopnea or leg edema.  Does not report any nausea, vomiting or abdominal pain.  Does not report any constipation or diarrhea.  Does not report any skeletal complaints.    Does not report frequency, urgency or hematuria.  Does not report any skin rashes or  lesions. Does not report any heat or cold intolerance.  Does not report any lymphadenopathy or petechiae.  Does not report any anxiety or depression.  Remaining review of systems is negative.    Past Medical History:  Diagnosis Date  . Arthritis    mild hands  . Asthma    pt denies 4/21   . Chest pain 07/25/2012  . Coronary artery disease   . Dysrhythmia    PAC'S, hx of atrial fib after OHS 04/2018  . ED (erectile dysfunction)   . H/O hiatal hernia   . Heart murmur   . Hypercholesterolemia   . Hypertension    MARGINALLY HIGH - NO MEDS -MONITORS  . Myocardial infarction ([place])    2017   . Pneumonia    hx of 2018   . PONV (postoperative nausea and vomiting)   . Prostate cancer ([place]) 03/27/2011   prostate bx=adenocarcinoma,  . Skin cancer 2008   basal cell face /removed  . Sleep apnea    borderline sleep apnea per patient no devices   :  Past Surgical History:  Procedure Laterality Date  . AORTIC VALVE REPLACEMENT N/A 04/29/2018   Procedure: AORTIC VALVE REPLACEMENT (AVR) using INSPIRIS RESILIA  AORTIC VALVE 20mm;  Surgeon: Ivin Poot, MD;  Location: [facility];  Service: Open Heart Surgery;  Laterality: N/A;  . basal cell cancer  2008   inside nose  . CARDIAC CATHETERIZATION N/A 06/03/2015  Procedure: Left Heart Cath and Coronary Angiography;  Surgeon: [name] Sine, MD;  Location: [facility];  Service: Cardiovascular;  Laterality: N/A;  . CATARACT EXTRACTION, BILATERAL  2007  . CORONARY ARTERY BYPASS GRAFT N/A 04/29/2018   Procedure: CORONARY ARTERY BYPASS GRAFTING (CABG) X 3; Using Left Internal Mammary Artery (LIMA) and Right Leg Greater Saphenous Vein Graft (SVG);  LIMA to LAD, SVG to OM1, SVG to RCA,;  Surgeon: Ivin Poot, MD;  Location: [facility];  Service: Open Heart Surgery;  Laterality: N/A;  . CYSTOSCOPY/RETROGRADE/URETEROSCOPY Left 08/21/2019   Procedure: CYSTOSCOPY/RETROGRADE/URETEROSCOPY/  LEFT BIOPSY/BRUSH BIOPSY/RENAL WASHINGS/ AND STENT PLACEMENT;   Surgeon: Raynelle Bring, MD;  Location: [facility];  Service: Urology;  Laterality: Left;  . RIGHT/LEFT HEART CATH AND CORONARY ANGIOGRAPHY N/A 04/26/2018   Procedure: RIGHT/LEFT HEART CATH AND CORONARY ANGIOGRAPHY;  Surgeon: [name] Sine, MD;  Location: [facility];  Service: Cardiovascular;  Laterality: N/A;  . ROBOT ASSISTED LAPAROSCOPIC RADICAL PROSTATECTOMY  08/10/2011   Procedure: ROBOTIC ASSISTED LAPAROSCOPIC RADICAL PROSTATECTOMY LEVEL 2;  Surgeon: Dutch Gray, MD;  Location: [facility];  Service: Urology;  Laterality: N/A;  . TEE WITHOUT CARDIOVERSION N/A 04/29/2018   Procedure: TRANSESOPHAGEAL ECHOCARDIOGRAM (TEE);  Surgeon: Prescott Gum, Collier Salina, MD;  Location: [facility];  Service: Open Heart Surgery;  Laterality: N/A;  . THYROID SURGERY  infant   uncertain details; no thyroid replacement listed  :   Current Outpatient Medications:  .  atorvastatin (LIPITOR) 80 MG tablet, TAKE 1 TABLET ONCE DAILY AT 6 PM. (Patient taking differently: Take 80 mg by mouth daily at 6 PM. ), Disp: 90 tablet, Rfl: 2 .  chlorhexidine (PERIDEX) 0.12 % solution, Rinse with 15 mls twice daily for 30 seconds. Use after breakfast and at bedtime. Spit out excess. Do not swallow. (Patient taking differently: Use as directed 15 mLs in the mouth or throat 2 (two) times daily. ), Disp: 960 mL, Rfl: 5 .  Cholecalciferol (VITAMIN D-3) 125 MCG (5000 UT) TABS, Take 5,000 Units by mouth daily., Disp: , Rfl:  .  Coenzyme Q10 (COQ10) 200 MG CAPS, Take 200 mg by mouth daily., Disp: , Rfl:  .  metoprolol succinate (TOPROL-XL) 100 MG 24 hr tablet, Take 1 tablet (100 mg total) by mouth at bedtime. Take with or immediately following a meal., Disp: 90 tablet, Rfl: 3 .  metoprolol succinate (TOPROL-XL) 100 MG 24 hr tablet, Take 100 mg by mouth daily. Take with or immediately following a meal., Disp: , Rfl:  .  nitroGLYCERIN (NITROSTAT) 0.4 MG SL tablet, 1 TAB UNDER TONGUE AS NEEDED FOR CHEST PAIN. MAY REPEAT EVERY 5 MIN FOR A TOTAL OF 3 DOSES.  (Patient taking differently: Place 0.4 mg under the tongue every 5 (five) minutes as needed for chest pain. ), Disp: 25 tablet, Rfl: 4 .  traMADol (ULTRAM) 50 MG tablet, Take 1-2 tablets (50-100 mg total) by mouth every 6 (six) hours as needed (pain)., Disp: 15 tablet, Rfl: 0 .  vitamin E 400 UNIT capsule, Take 400 Units by mouth daily., Disp: , Rfl: :  Allergies  Allergen Reactions  . Other Itching and Other (See Comments)    Pet dander, seasonal allergies = Runny nose, itchy eyes  :  Family History  Problem Relation Age of Onset  . Prostate cancer Father 92       in his 70's/other comorbidities/79 deceased  . Alcohol abuse Father   . Liver disease Father   . Heart attack Father 49  .  Breast cancer Mother 20       mets to bone,deceased age 86  . Cervical cancer Sister   . Cervical cancer Sister   . Breast cancer Sister   . CAD Brother        Died of sudden cardiac death/MI  :  Social History   Socioeconomic History  . Marital status: Married    Spouse name: [name]   . Number of children: 0  . Years of education: 15 years   . Highest education level: Not on file  Occupational History    Employer: [employer]    Comment: customer service [employer]  Tobacco Use  . Smoking status: Never Smoker  . Smokeless tobacco: Never Used  Substance and Sexual Activity  . Alcohol use: Not Currently  . Drug use: No  . Sexual activity: Not on file  Other Topics Concern  . Not on file  Social History Narrative  . Not on file   Social Determinants of Health   Financial Resource Strain:   . Difficulty of Paying Living Expenses:   Food Insecurity:   . Worried About Charity fundraiser in the Last Year:   . Arboriculturist in the Last Year:   Transportation Needs:   . Film/video editor (Medical):   [name] Lack of Transportation (Non-Medical):   Physical Activity:   . Days of Exercise per Week:   . Minutes of Exercise per Session:   Stress:   . Feeling of Stress :   Social  Connections:   . Frequency of Communication with Friends and Family:   . Frequency of Social Gatherings with Friends and Family:   . Attends Religious Services:   . Active Member of Clubs or Organizations:   . Attends Archivist Meetings:   [name] Marital Status:   Intimate Partner Violence:   . Fear of Current or Ex-Partner:   . Emotionally Abused:   [name] Physically Abused:   . Sexually Abused:   :  Pertinent items are noted in HPI.  Exam: Blood pressure 138/81, pulse 82, temperature 98.7 F (37.1 C), temperature source Temporal, resp. rate 18, height 6' (1.829 m), weight 197 lb (89.4 kg), SpO2 97 %.  ECOG 1  General appearance: alert and cooperative appeared without distress. Head: atraumatic without any abnormalities. Eyes: conjunctivae/corneas clear. PERRL.  Sclera anicteric. Throat: lips, mucosa, and tongue normal; without oral thrush or ulcers. Resp: clear to auscultation bilaterally without rhonchi, wheezes or dullness to percussion. Cardio: regular rate and rhythm, S1, S2 normal, no murmur, click, rub or gallop GI: soft, non-tender; bowel sounds normal; no masses,  no organomegaly Skin: Skin color, texture, turgor normal. No rashes or lesions Lymph nodes: Cervical, supraclavicular, and axillary nodes normal. Neurologic: Grossly normal without any motor, sensory or deep tendon reflexes. Musculoskeletal: No joint deformity or effusion.  CBC    Component Value Date/Time   WBC 6.5 08/15/2019 1031   RBC 4.53 08/15/2019 1031   HGB 12.5 (L) 08/15/2019 1031   HCT 37.7 (L) 08/15/2019 1031   PLT 251 08/15/2019 1031   MCV 83.2 08/15/2019 1031   MCH 27.6 08/15/2019 1031   MCHC 33.2 08/15/2019 1031   RDW 14.5 08/15/2019 1031   LYMPHSABS 1.5 04/26/2018 1212   MONOABS 0.5 04/26/2018 1212   EOSABS 0.2 04/26/2018 1212   BASOSABS 0.0 04/26/2018 1212     Chemistry      Component Value Date/Time   NA 139 08/15/2019 1031   NA 137  04/07/2019 1021   K 4.2 08/15/2019 1031    CL 106 08/15/2019 1031   CO2 23 08/15/2019 1031   BUN 25 (H) 08/15/2019 1031   BUN 29 (H) 04/07/2019 1021   CREATININE 1.11 08/15/2019 1031      Component Value Date/Time   CALCIUM 9.0 08/15/2019 1031   ALKPHOS 70 04/07/2019 1021   AST 10 04/07/2019 1021   ALT 6 04/07/2019 1021   BILITOT 0.8 04/07/2019 1021       Assessment and Plan:    74 year old man with:   1.  High-grade urothelial carcinoma arising from left renal pelvis that is biopsy-proven in May 2021.  He has regional small lymph node which could indicate locally advanced disease but no evidence of metastatic disease noted.  The natural course of this disease was reviewed and treatment options were discussed at this time.  Primary surgical therapy remains the standard of care to achieve curative intent.  The role of chemotherapy has been discussed today which is predominantly extrapolation from bladder cancer literature.  More recent data to suggest adjuvant chemotherapy has a role and this particular tumor is and certainly would make sense to introduce it in the neoadjuvant setting given his large tumor as well as possible regional lymph node involvement.   The logistics and rationale for using chemotherapy was reviewed today in detail.  Complication associated with cisplatin and gemcitabine chemotherapy was discussed.  These complications include nausea, vomiting, myelosuppression, fatigue, infusion related complications, renal insufficiency, neutropenia, neutropenic sepsis and rarely serious thrombosis, hospitalization and death.  The benefit would also if he has an excellent response to chemotherapy, curative surgical resection may be attempted.  The plan is to treat with gemcitabine and cisplatin on day 1, gemcitabine day 8 out of a 21-day cycle.  Anticipate needing 3 to 4 cycles of therapy    After discussion today, he is agreeable to proceed after chemo education class and obtaining the final clearance from cardiology.      2.  IV access: Risks and benefits of using Port-A-Cath versus peripheral veins was discussed today.  Complication associated with Port-A-Cath insertion include bleeding, infection and thrombosis.  After discussing the risks and benefits, he is agreeable to proceed after being cleared by cardiology.   3.  Antiemetics: Prescription for Compazine was made available to him.   4.  Renal function surveillance: We will continue to monitor on cisplatin therapy.  Baseline kidney function is normal.   5.  Goals of care:  Therapy will be curative at this time.   6.  Follow-up: will be in the immediate future to start chemotherapy after chemotherapy education class and clearance from cardiology.   60  minutes were dedicated to this visit. The time was spent on reviewing laboratory data, imaging studies, discussing treatment options,  and answering questions regarding future plan.     A copy of this consult has been forwarded to the requesting physician.

## 2019-09-12 NOTE — Telephone Encounter (Signed)
Left message for pt to call, the appointment is for pre-op.

## 2019-09-12 NOTE — Telephone Encounter (Signed)
Patient requesting to speak with [name] in regards to his appt with her on 09/25/19.

## 2019-09-12 NOTE — Progress Notes (Signed)

## 2019-09-13 ENCOUNTER — Encounter: Payer: Self-pay | Admitting: Family Medicine

## 2019-09-13 ENCOUNTER — Telehealth: Payer: Self-pay | Admitting: Family Medicine

## 2019-09-13 ENCOUNTER — Telehealth: Payer: Self-pay | Admitting: Oncology

## 2019-09-13 NOTE — Telephone Encounter (Signed)
Appointment entered

## 2019-09-13 NOTE — Telephone Encounter (Signed)
Pt has appt 09/25/19 with [name], PAC for pre op clearance . I will forward clearance notes to [facility] for upcoming appt.

## 2019-09-13 NOTE — Telephone Encounter (Signed)
Pt would like for Dr. Ethlyn Gallery to give him call back advising him if a cortisone shot or going to the pharmacy to purchase some cortisone cream or send a referral to a orthopedic.  Pt state that the place on his arm per the Oncologist is not of cancer.  Pt would like to have a call back today if possible.

## 2019-09-13 NOTE — Telephone Encounter (Signed)
Lawrence Hall is calling back still requesting to speak with Lawrence Hall. He states he is thankful for Debra's call, but that is not what he was wanting to speak with [name] in regards to. [place] is stating if [name] is not able to call him today he can wait until tomorrow morning. He also wanted me to let you all know he is so very thankful for all that you all do and you all are superhero's in his eyes.  Please advise.

## 2019-09-13 NOTE — Telephone Encounter (Signed)
Please add him on for a steroid shoulder injection on Friday at 11:30 (we have an opening). Thanks. Will be in office.

## 2019-09-13 NOTE — Telephone Encounter (Signed)
Spoke with who report he has a pre-op appointment schedule for 6/7. Pt states he wanted to make sure [name] will consult with Dr. Loletha Grayer prior to clearing him for surgery.  Pt also report he feels he had a bad reaction to COVID vaccine which has caused arm pain. Pt stated he doesn't feel he will be able to tolerate chemo with the pain he is experiencing at night from the vaccine. Nurse advised pt to contact pcp and oncologist. Pt verbalized understanding.

## 2019-09-13 NOTE — Telephone Encounter (Signed)
Scheduled appt per 5/25 los.  Left a vm of the appt date and time.

## 2019-09-14 ENCOUNTER — Other Ambulatory Visit: Payer: Self-pay

## 2019-09-14 NOTE — Telephone Encounter (Signed)
This message was addressed by phone call.  Patient was scheduled to come in on Friday to have an injection in the shoulder.  He wishes at this time not to see another specialist, although we discussed that Ortho would be an option to examine and treat his ongoing arm and shoulder pain.  He would like to see if we can get something done to help him prior to starting the chemo in June.  We also discussed using a topical cream if the injection is not helpful.

## 2019-09-15 ENCOUNTER — Ambulatory Visit (INDEPENDENT_AMBULATORY_CARE_PROVIDER_SITE_OTHER): Payer: PPO | Admitting: Family Medicine

## 2019-09-15 ENCOUNTER — Encounter: Payer: Self-pay | Admitting: Family Medicine

## 2019-09-15 VITALS — BP 122/64 | HR 89 | Temp 97.3°F | Ht 72.0 in | Wt 197.5 lb

## 2019-09-15 DIAGNOSIS — M25511 Pain in right shoulder: Secondary | ICD-10-CM

## 2019-09-15 DIAGNOSIS — M7501 Adhesive capsulitis of right shoulder: Secondary | ICD-10-CM | POA: Diagnosis not present

## 2019-09-15 DIAGNOSIS — S46211S Strain of muscle, fascia and tendon of other parts of biceps, right arm, sequela: Secondary | ICD-10-CM

## 2019-09-15 MED ORDER — TRIAMCINOLONE ACETONIDE 40 MG/ML IJ SUSP
40.0000 mg | Freq: Once | INTRAMUSCULAR | Status: AC
  Administered 2019-09-15: 40 mg via INTRAMUSCULAR

## 2019-09-15 MED ORDER — LIDOCAINE 5 % EX OINT
TOPICAL_OINTMENT | CUTANEOUS | 2 refills | Status: DC
Start: 2019-09-15 — End: 2019-09-25

## 2019-09-15 NOTE — Patient Instructions (Signed)
Adhesive Capsulitis  Adhesive capsulitis, also called frozen shoulder, causes the shoulder to become stiff and painful to move. This condition happens when there is inflammation of the tendons and ligaments that surround the shoulder joint (shoulder capsule). What are the causes? This condition may be caused by:  An injury to your shoulder joint.  Straining your shoulder.  Not moving your shoulder for a period of time. This can happen if your arm was injured or in a sling.  Long-standing conditions, such as: ? Diabetes. ? Thyroid problems. ? Heart disease. ? Stroke. ? Rheumatoid arthritis. ? Lung disease. In some cases, the cause is not known. What increases the risk? You are more likely to develop this condition if you are:  A woman.  Older than 76 years of age. What are the signs or symptoms? Symptoms of this condition include:  Pain in your shoulder when you move your arm. There may also be pain when parts of your shoulder are touched. The pain may be worse at night or when you are resting.  A sore or aching shoulder.  The inability to move your shoulder normally.  Muscle spasms. How is this diagnosed? This condition is diagnosed with a physical exam and imaging tests, such as an X-ray or MRI. How is this treated? This condition may be treated with:  Treatment of the underlying cause or condition.  Medicine. Medicine may be given to relieve pain, inflammation, or muscle spasms.  Steroid injections into the shoulder joint.  Physical therapy. This involves performing exercises to get the shoulder moving again.  Acupuncture. This is a type of treatment that involves stimulating specific points on your body by inserting thin needles through your skin.  Shoulder manipulation. This is a procedure to move the shoulder into another position. It is done after you are given a medicine to make you fall asleep (general anesthetic). The joint may also be injected with salt  water at high pressure to break down scarring.  Surgery. This may be done in severe cases when other treatments have failed. Although most people recover completely from adhesive capsulitis, some may not regain full shoulder movement. Follow these instructions at home: Managing pain, stiffness, and swelling      If directed, put ice on the injured area: ? Put ice in a plastic bag. ? Place a towel between your skin and the bag. ? Leave the ice on for 20 minutes, 2-3 times per day.  If directed, apply heat to the affected area before you exercise. Use the heat source that your health care provider recommends, such as a moist heat pack or a heating pad. ? Place a towel between your skin and the heat source. ? Leave the heat on for 20-30 minutes. ? Remove the heat if your skin turns bright red. This is especially important if you are unable to feel pain, heat, or cold. You may have a greater risk of getting burned. General instructions  Take over-the-counter and prescription medicines only as told by your health care provider.  If you are being treated with physical therapy, follow instructions from your physical therapist.  Avoid exercises that put a lot of demand on your shoulder, such as throwing. These exercises can make pain worse.  Keep all follow-up visits as told by your health care provider. This is important. Contact a health care provider if:  You develop new symptoms.  Your symptoms get worse. Summary  Adhesive capsulitis, also called frozen shoulder, causes the shoulder to become   stiff and painful to move.  You are more likely to have this condition if you are a woman and over age 76.  It is treated with physical therapy, medicines, and sometimes surgery. This information is not intended to replace advice given to you by your health care provider. Make sure you discuss any questions you have with your health care provider. Document Revised: 09/10/2017 Document  Reviewed: 09/10/2017 Elsevier Patient Education  [place].  Shoulder Exercises Ask your health care provider which exercises are safe for you. Do exercises exactly as told by your health care provider and adjust them as directed. It is normal to feel mild stretching, pulling, tightness, or discomfort as you do these exercises. Stop right away if you feel sudden pain or your pain gets worse. Do not begin these exercises until told by your health care provider. Stretching exercises External rotation and abduction This exercise is sometimes called corner stretch. This exercise rotates your arm outward (external rotation) and moves your arm out from your body (abduction). 1. Stand in a doorway with one of your feet slightly in front of the other. This is called a staggered stance. If you cannot reach your forearms to the door frame, stand facing a corner of a room. 2. Choose one of the following positions as told by your health care provider: ? Place your hands and forearms on the door frame above your head. ? Place your hands and forearms on the door frame at the height of your head. ? Place your hands on the door frame at the height of your elbows. 3. Slowly move your weight onto your front foot until you feel a stretch across your chest and in the front of your shoulders. Keep your head and chest upright and keep your abdominal muscles tight. 4. Hold for __________ seconds. 5. To release the stretch, shift your weight to your back foot. Repeat __________ times. Complete this exercise __________ times a day. Extension, standing 1. Stand and hold a broomstick, a cane, or a similar object behind your back. ? Your hands should be a little wider than shoulder width apart. ? Your palms should face away from your back. 2. Keeping your elbows straight and your shoulder muscles relaxed, move the stick away from your body until you feel a stretch in your shoulders (extension). ? Avoid shrugging  your shoulders while you move the stick. Keep your shoulder blades tucked down toward the middle of your back. 3. Hold for __________ seconds. 4. Slowly return to the starting position. Repeat __________ times. Complete this exercise __________ times a day. Range-of-motion exercises Pendulum  1. Stand near a wall or a surface that you can hold onto for balance. 2. Bend at the waist and let your left / right arm hang straight down. Use your other arm to support you. Keep your back straight and do not lock your knees. 3. Relax your left / right arm and shoulder muscles, and move your hips and your trunk so your left / right arm swings freely. Your arm should swing because of the motion of your body, not because you are using your arm or shoulder muscles. 4. Keep moving your hips and trunk so your arm swings in the following directions, as told by your health care provider: ? Side to side. ? Forward and backward. ? In clockwise and counterclockwise circles. 5. Continue each motion for __________ seconds, or for as long as told by your health care provider. 6. Slowly return to  the starting position. Repeat __________ times. Complete this exercise __________ times a day. Shoulder flexion, standing  1. Stand and hold a broomstick, a cane, or a similar object. Place your hands a little more than shoulder width apart on the object. Your left / right hand should be palm up, and your other hand should be palm down. 2. Keep your elbow straight and your shoulder muscles relaxed. Push the stick up with your healthy arm to raise your left / right arm in front of your body, and then over your head until you feel a stretch in your shoulder (flexion). ? Avoid shrugging your shoulder while you raise your arm. Keep your shoulder blade tucked down toward the middle of your back. 3. Hold for __________ seconds. 4. Slowly return to the starting position. Repeat __________ times. Complete this exercise __________  times a day. Shoulder abduction, standing 1. Stand and hold a broomstick, a cane, or a similar object. Place your hands a little more than shoulder width apart on the object. Your left / right hand should be palm up, and your other hand should be palm down. 2. Keep your elbow straight and your shoulder muscles relaxed. Push the object across your body toward your left / right side. Raise your left / right arm to the side of your body (abduction) until you feel a stretch in your shoulder. ? Do not raise your arm above shoulder height unless your health care provider tells you to do that. ? If directed, raise your arm over your head. ? Avoid shrugging your shoulder while you raise your arm. Keep your shoulder blade tucked down toward the middle of your back. 3. Hold for __________ seconds. 4. Slowly return to the starting position. Repeat __________ times. Complete this exercise __________ times a day. Internal rotation  1. Place your left / right hand behind your back, palm up. 2. Use your other hand to dangle an exercise band, a towel, or a similar object over your shoulder. Grasp the band with your left / right hand so you are holding on to both ends. 3. Gently pull up on the band until you feel a stretch in the front of your left / right shoulder. The movement of your arm toward the center of your body is called internal rotation. ? Avoid shrugging your shoulder while you raise your arm. Keep your shoulder blade tucked down toward the middle of your back. 4. Hold for __________ seconds. 5. Release the stretch by letting go of the band and lowering your hands. Repeat __________ times. Complete this exercise __________ times a day. Strengthening exercises External rotation  1. Sit in a stable chair without armrests. 2. Secure an exercise band to a stable object at elbow height on your left / right side. 3. Place a soft object, such as a folded towel or a small pillow, between your left /  right upper arm and your body to move your elbow about 4 inches (10 cm) away from your side. 4. Hold the end of the exercise band so it is tight and there is no slack. 5. Keeping your elbow pressed against the soft object, slowly move your forearm out, away from your abdomen (external rotation). Keep your body steady so only your forearm moves. 6. Hold for __________ seconds. 7. Slowly return to the starting position. Repeat __________ times. Complete this exercise __________ times a day. Shoulder abduction  1. Sit in a stable chair without armrests, or stand up. 2. Hold a  __________ weight in your left / right hand, or hold an exercise band with both hands. 3. Start with your arms straight down and your left / right palm facing in, toward your body. 4. Slowly lift your left / right hand out to your side (abduction). Do not lift your hand above shoulder height unless your health care provider tells you that this is safe. ? Keep your arms straight. ? Avoid shrugging your shoulder while you do this movement. Keep your shoulder blade tucked down toward the middle of your back. 5. Hold for __________ seconds. 6. Slowly lower your arm, and return to the starting position. Repeat __________ times. Complete this exercise __________ times a day. Shoulder extension 1. Sit in a stable chair without armrests, or stand up. 2. Secure an exercise band to a stable object in front of you so it is at shoulder height. 3. Hold one end of the exercise band in each hand. Your palms should face each other. 4. Straighten your elbows and lift your hands up to shoulder height. 5. Step back, away from the secured end of the exercise band, until the band is tight and there is no slack. 6. Squeeze your shoulder blades together as you pull your hands down to the sides of your thighs (extension). Stop when your hands are straight down by your sides. Do not let your hands go behind your body. 7. Hold for __________  seconds. 8. Slowly return to the starting position. Repeat __________ times. Complete this exercise __________ times a day. Shoulder row 1. Sit in a stable chair without armrests, or stand up. 2. Secure an exercise band to a stable object in front of you so it is at waist height. 3. Hold one end of the exercise band in each hand. Position your palms so that your thumbs are facing the ceiling (neutral position). 4. Bend each of your elbows to a 90-degree angle (right angle) and keep your upper arms at your sides. 5. Step back until the band is tight and there is no slack. 6. Slowly pull your elbows back behind you. 7. Hold for __________ seconds. 8. Slowly return to the starting position. Repeat __________ times. Complete this exercise __________ times a day. Shoulder press-ups  1. Sit in a stable chair that has armrests. Sit upright, with your feet flat on the floor. 2. Put your hands on the armrests so your elbows are bent and your fingers are pointing forward. Your hands should be about even with the sides of your body. 3. Push down on the armrests and use your arms to lift yourself off the chair. Straighten your elbows and lift yourself up as much as you comfortably can. ? Move your shoulder blades down, and avoid letting your shoulders move up toward your ears. ? Keep your feet on the ground. As you get stronger, your feet should support less of your body weight as you lift yourself up. 4. Hold for __________ seconds. 5. Slowly lower yourself back into the chair. Repeat __________ times. Complete this exercise __________ times a day. Wall push-ups  1. Stand so you are facing a stable wall. Your feet should be about one arm-length away from the wall. 2. Lean forward and place your palms on the wall at shoulder height. 3. Keep your feet flat on the floor as you bend your elbows and lean forward toward the wall. 4. Hold for __________ seconds. 5. Straighten your elbows to push yourself  back to the starting position. Repeat  __________ times. Complete this exercise __________ times a day. This information is not intended to replace advice given to you by your health care provider. Make sure you discuss any questions you have with your health care provider. Document Revised: 07/29/2018 Document Reviewed: 05/06/2018 Elsevier Patient Education  [place].

## 2019-09-15 NOTE — Progress Notes (Signed)
Lawrence Hall DOB: 12-28-1945 Encounter date: 09/15/2019  This is a 74 y.o. male who presents with Chief Complaint  Patient presents with  . Follow-up    History of present illness: Health good until 2017 when he had circumflex blockage. Difficult time with communications with prior pcp. Jan 2020 failed stress, then triple bypass surgery.   Two important questions today: 1. Jan 10th 2020 when in for surgery they knicked artery when going in for bypass; had to get 2 frozen blood transfusions to get stable for surgery. Came out 7 hours later and infection was a concern. Had 3 IVs in right arm. On 11th - they placed iv for 24 hr antibiotic, but 3 hours in he had to press emergency nurse alert because of searing pain in arm. IV pulled and placed in left arm - completed abx in left arm. Wondering about residual damage to right arm with this? There were follow up cardiac issues to follow and feels like this issue didn't get addressed again.   Wondering if vessels are at place they can handle chemo. Thinks bypass was done using vessels from right upper chest and worries about remaining vasculature handling chemo (surg notes reviewed with him and he feels better knowing where grafts were to and from)  When he has pain up on shoulder starts at top and at worse radiates down to hand.   Severe pain when he tries to abduct right arm over 90 degrees. Feels it in upper right arm. Can't get right arm up to get key in ignition; actually has to hold right hand with left hand to get key in and to turn key. Feels limited going forward. Hard to fully go behind your back. Feels weak secondary to pain. Numbness/tingling have subsided since last visit. Used to feel pain radiating down arm and then get numbness in lower arm. Hand numbness is better. Still has pain radiating down arm. Has had some upper shoulder pain and feels like he is getting some of this into neck. Right handed. No previous injury to right shoulder.  Pain was pretty sudden when it came. It was present when he woke in the morning. Timing was between covid vaccines. On further consideration, patient states that he was doing regular bicep curl routine, and wonders if this contributed to pain.     Allergies  Allergen Reactions  . Other Itching and Other (See Comments)    Pet dander, seasonal allergies = Runny nose, itchy eyes   Current Meds  Medication Sig  . atorvastatin (LIPITOR) 80 MG tablet TAKE 1 TABLET ONCE DAILY AT 6 PM. (Patient taking differently: Take 80 mg by mouth daily at 6 PM. )  . chlorhexidine (PERIDEX) 0.12 % solution Rinse with 15 mls twice daily for 30 seconds. Use after breakfast and at bedtime. Spit out excess. Do not swallow. (Patient taking differently: Use as directed 15 mLs in the mouth or throat 2 (two) times daily. )  . Cholecalciferol (VITAMIN D-3) 125 MCG (5000 UT) TABS Take 5,000 Units by mouth daily.  . Coenzyme Q10 (COQ10) 200 MG CAPS Take 200 mg by mouth daily.  . metoprolol succinate (TOPROL-XL) 100 MG 24 hr tablet Take 100 mg by mouth daily. Take with or immediately following a meal.  . nitroGLYCERIN (NITROSTAT) 0.4 MG SL tablet 1 TAB UNDER TONGUE AS NEEDED FOR CHEST PAIN. MAY REPEAT EVERY 5 MIN FOR A TOTAL OF 3 DOSES. (Patient taking differently: Place 0.4 mg under the tongue every 5 (five) minutes  as needed for chest pain. )  . traMADol (ULTRAM) 50 MG tablet Take 1-2 tablets (50-100 mg total) by mouth every 6 (six) hours as needed (pain).  . vitamin E 400 UNIT capsule Take 400 Units by mouth daily.    Review of Systems  Constitutional: Negative for chills, fatigue and fever.  Respiratory: Negative for cough, chest tightness, shortness of breath and wheezing.   Cardiovascular: Negative for chest pain, palpitations and leg swelling.  Musculoskeletal: Positive for arthralgias and neck pain.  Neurological: Negative for numbness.    Objective:  BP 122/64 (BP Location: Left Arm, Patient Position: Sitting,  Cuff Size: Large)   Pulse 89   Temp (!) 97.3 F (36.3 C) (Temporal)   Ht 6' (1.829 m)   Wt 197 lb 8 oz (89.6 kg)   BMI 26.79 kg/m   Weight: 197 lb 8 oz (89.6 kg)   BP Readings from Last 3 Encounters:  09/15/19 122/64  09/12/19 138/81  08/21/19 (!) 161/97   Wt Readings from Last 3 Encounters:  09/15/19 197 lb 8 oz (89.6 kg)  09/12/19 197 lb (89.4 kg)  08/21/19 202 lb 2.6 oz (91.7 kg)    Physical Exam Constitutional:      Appearance: Normal appearance.  HENT:     Head: Normocephalic and atraumatic.  Cardiovascular:     Pulses: Normal pulses.  Musculoskeletal:     Cervical back: Normal range of motion and neck supple.     Comments: He has severe pain with abduction right arm over 90 degrees - also has tenderness over deltoid. Able to palpate retracted muscle body bicep on right; notable deficit compared with left. There is tenderness with strength testing bicep. + pain with hawkins. + pain with strength testing shoulder. No obvious weakness with testing 90 degrees or below. Less pain with passive ROM.   Neurological:     Mental Status: He is alert.     Assessment/Plan 1. Acute pain of right shoulder Discussed options. He would like to work on pain relief asap with upcoming plans for chemo. He understands that injection won't help with bicep and that he may need pt for shoulder concerns, but would like to try injection to see if this helps with pain, inflammation. Gentle ROM exercises given for him to complete over weekend. We will see if we can get him in with sports med soon for eval. - triamcinolone acetonide (KENALOG-40) injection 40 mg  2. Rupture of right biceps tendon, sequela In hind sight; believes injury occurred related to weight lifting rather than immunization. We discussed that there is likely not need for surgical intervention of bicep. He will work on [place].  - Ambulatory referral to Sports Medicine  3. Adhesive capsulitis of right shoulder See  above - Ambulatory referral to Sports Medicine   Reviewed med concerns and vascular concerns with him today.  Reviewed treatment plan. Reviewed previous surgical history and addressed concerns for upcoming procedures with regards to vascular health/cardiac health. Visit and charting time 30 minutes.   [name], MD

## 2019-09-19 ENCOUNTER — Ambulatory Visit: Payer: PPO | Admitting: Family Medicine

## 2019-09-19 ENCOUNTER — Encounter: Payer: Self-pay | Admitting: Family Medicine

## 2019-09-19 ENCOUNTER — Ambulatory Visit: Payer: Self-pay

## 2019-09-19 ENCOUNTER — Other Ambulatory Visit: Payer: Self-pay

## 2019-09-19 ENCOUNTER — Ambulatory Visit (INDEPENDENT_AMBULATORY_CARE_PROVIDER_SITE_OTHER): Payer: PPO

## 2019-09-19 VITALS — BP 100/72 | HR 73 | Ht 72.0 in | Wt 197.0 lb

## 2019-09-19 DIAGNOSIS — G8929 Other chronic pain: Secondary | ICD-10-CM

## 2019-09-19 DIAGNOSIS — M25511 Pain in right shoulder: Secondary | ICD-10-CM | POA: Diagnosis not present

## 2019-09-19 DIAGNOSIS — S46011A Strain of muscle(s) and tendon(s) of the rotator cuff of right shoulder, initial encounter: Secondary | ICD-10-CM

## 2019-09-19 DIAGNOSIS — M75101 Unspecified rotator cuff tear or rupture of right shoulder, not specified as traumatic: Secondary | ICD-10-CM | POA: Insufficient documentation

## 2019-09-19 DIAGNOSIS — M542 Cervicalgia: Secondary | ICD-10-CM | POA: Diagnosis not present

## 2019-09-19 MED ORDER — TRAZODONE HCL 50 MG PO TABS
ORAL_TABLET | ORAL | 0 refills | Status: DC
Start: 2019-09-19 — End: 2020-01-24

## 2019-09-19 NOTE — Patient Instructions (Addendum)
Good to see you Rotator cuff tear Xrays today of shoulder and neck Exercise 3 times a week Ice 20 mins 3-4 hours Arm compression daily Keep using cream Support arm with pillow at night Trazodone half to a full tab nightly See me again in 6 weeks

## 2019-09-19 NOTE — Assessment & Plan Note (Signed)
Patient does have a bicep muscular tendon tear but also has a rotator cuff tear of the supraspinatus.  We will get x-ray secondary to patient having a malignant tumor and make sure there is no bony metastasis.  Patient's previous work-up for this has been unremarkable.  I do believe that patient could be a surgical candidate but with everything going on and patient is hardly at this point we will try conservative therapy first.  Patient recently did have an intra-articular injection and we will continue to monitor.  Discussed home exercises.  Patient work with Product/process development scientist.  Patient will do more of a compression sleeve secondary to the muscle tear noted of the bicep.  Follow-up again in 4 to 6 weeks

## 2019-09-19 NOTE — Progress Notes (Signed)
[facility] [address] [place] Phone: [phone] Subjective:   I Lawrence Hall am serving as a Education administrator for Dr. Hulan Hall.  This visit occurred during the SARS-CoV-2 public health emergency.  Safety protocols were in place, including screening questions prior to the visit, additional usage of staff PPE, and extensive cleaning of exam room while observing appropriate contact time as indicated for disinfecting solutions.   I'm seeing this patient by the request  of:  [name], [name], MD  CC: Right shoulder pain  [id number]  Lawrence Hall is a 74 y.o. male coming in with complaint of right shoulder pain. Patient states that after doing 3 lb hand weights doing curls and shoulder press. Felt a pain in the right arm. Was told by Dr. Ethlyn Hall that he detatched his bicep tendon. Was given a steroid shot Friday and topical ointment. This morning the arm has felt better than it has in weeks. Pain is worse at night. Patient wants to know why the pain is worse at night? Numbness and tingling to the finger tips has gotten better. Pain would radiate to his hand and he would lose sensation in his hand. Ice has helped this issue. Reduced ROM and weakness due to loss of ROM.   Onset- February  Location - Right bicep tendon Duration- intermitted  Character- achy, sore, sharp with sudden movement Aggravating factors- exercising Reliving factors- Ice everyday  Therapies tried- topical, ice, steroid injection  Severity-  7/10 at its worse       Past Medical History:  Diagnosis Date  . Arthritis    mild hands  . Asthma    pt denies 4/21   . Chest pain 07/25/2012  . Coronary artery disease   . Dysrhythmia    PAC'S, hx of atrial fib after OHS 04/2018  . ED (erectile dysfunction)   . H/O hiatal hernia   . Heart murmur   . Hypercholesterolemia   . Hypertension    MARGINALLY HIGH - NO MEDS -MONITORS  . Myocardial infarction ([place])    2017     . Pneumonia    hx of 2018   . PONV (postoperative nausea and vomiting)   . Prostate cancer ([place]) 03/27/2011   prostate bx=adenocarcinoma,  . Skin cancer 2008   basal cell face /removed  . Sleep apnea    borderline sleep apnea per patient no devices    Past Surgical History:  Procedure Laterality Date  . AORTIC VALVE REPLACEMENT N/A 04/29/2018   Procedure: AORTIC VALVE REPLACEMENT (AVR) using INSPIRIS RESILIA  AORTIC VALVE 26mm;  Surgeon: Ivin Poot, MD;  Location: [facility];  Service: Open Heart Surgery;  Laterality: N/A;  . basal cell cancer  2008   inside nose  . CARDIAC CATHETERIZATION N/A 06/03/2015   Procedure: Left Heart Cath and Coronary Angiography;  Surgeon: [name] Sine, MD;  Location: [facility];  Service: Cardiovascular;  Laterality: N/A;  . CATARACT EXTRACTION, BILATERAL  2007  . CORONARY ARTERY BYPASS GRAFT N/A 04/29/2018   Procedure: CORONARY ARTERY BYPASS GRAFTING (CABG) X 3; Using Left Internal Mammary Artery (LIMA) and Right Leg Greater Saphenous Vein Graft (SVG);  LIMA to LAD, SVG to OM1, SVG to RCA,;  Surgeon: Ivin Poot, MD;  Location: [facility];  Service: Open Heart Surgery;  Laterality: N/A;  . CYSTOSCOPY/RETROGRADE/URETEROSCOPY Left 08/21/2019   Procedure: CYSTOSCOPY/RETROGRADE/URETEROSCOPY/  LEFT BIOPSY/BRUSH BIOPSY/RENAL WASHINGS/ AND STENT PLACEMENT;  Surgeon: Raynelle Bring, MD;  Location: [facility];  Service: Urology;  Laterality: Left;  . RIGHT/LEFT HEART CATH AND CORONARY ANGIOGRAPHY N/A 04/26/2018   Procedure: RIGHT/LEFT HEART CATH AND CORONARY ANGIOGRAPHY;  Surgeon: [name] Sine, MD;  Location: [facility];  Service: Cardiovascular;  Laterality: N/A;  . ROBOT ASSISTED LAPAROSCOPIC RADICAL PROSTATECTOMY  08/10/2011   Procedure: ROBOTIC ASSISTED LAPAROSCOPIC RADICAL PROSTATECTOMY LEVEL 2;  Surgeon: Dutch Gray, MD;  Location: [facility];  Service: Urology;  Laterality: N/A;  . TEE WITHOUT CARDIOVERSION N/A 04/29/2018   Procedure: TRANSESOPHAGEAL  ECHOCARDIOGRAM (TEE);  Surgeon: Prescott Gum, Collier Salina, MD;  Location: [facility];  Service: Open Heart Surgery;  Laterality: N/A;  . THYROID SURGERY  infant   uncertain details; no thyroid replacement listed   Social History   Socioeconomic History  . Marital status: Married    Spouse name: Lawrence Hall   . Number of children: 0  . Years of education: 15 years   . Highest education level: Not on file  Occupational History    Employer: [employer]    Comment: customer service [employer]  Tobacco Use  . Smoking status: Never Smoker  . Smokeless tobacco: Never Used  Substance and Sexual Activity  . Alcohol use: Not Currently  . Drug use: No  . Sexual activity: Not on file  Other Topics Concern  . Not on file  Social History Narrative  . Not on file   Social Determinants of Health   Financial Resource Strain:   . Difficulty of Paying Living Expenses:   Food Insecurity:   . Worried About Charity fundraiser in the Last Year:   . Arboriculturist in the Last Year:   Transportation Needs:   . Film/video editor (Medical):   [name] Lack of Transportation (Non-Medical):   Physical Activity:   . Days of Exercise per Week:   . Minutes of Exercise per Session:   Stress:   . Feeling of Stress :   Social Connections:   . Frequency of Communication with Friends and Family:   . Frequency of Social Gatherings with Friends and Family:   . Attends Religious Services:   . Active Member of Clubs or Organizations:   . Attends Archivist Meetings:   [name] Marital Status:    Allergies  Allergen Reactions  . Other Itching and Other (See Comments)    Pet dander, seasonal allergies = Runny nose, itchy eyes   Family History  Problem Relation Age of Onset  . Prostate cancer Father 47       in his 70's/other comorbidities/79 deceased  . Alcohol abuse Father   . Liver disease Father   . Heart attack Father 9  . Breast cancer Mother 58       mets to bone,deceased age 55  . Cervical cancer Sister    . Cervical cancer Sister   . Breast cancer Sister   . CAD Brother        Died of sudden cardiac death/MI     Current Outpatient Medications (Cardiovascular):  .  atorvastatin (LIPITOR) 80 MG tablet, TAKE 1 TABLET ONCE DAILY AT 6 PM. (Patient taking differently: Take 80 mg by mouth daily at 6 PM. ) .  metoprolol succinate (TOPROL-XL) 100 MG 24 hr tablet, Take 100 mg by mouth daily. Take with or immediately following a meal. .  nitroGLYCERIN (NITROSTAT) 0.4 MG SL tablet, 1 TAB UNDER TONGUE AS NEEDED FOR CHEST PAIN. MAY REPEAT EVERY 5 MIN FOR A TOTAL OF 3 DOSES. (Patient  taking differently: Place 0.4 mg under the tongue every 5 (five) minutes as needed for chest pain. )     Current Outpatient Medications (Other):  .  chlorhexidine (PERIDEX) 0.12 % solution, Rinse with 15 mls twice daily for 30 seconds. Use after breakfast and at bedtime. Spit out excess. Do not swallow. (Patient taking differently: Use as directed 15 mLs in the mouth or throat 2 (two) times daily. ) .  Cholecalciferol (VITAMIN D-3) 125 MCG (5000 UT) TABS, Take 5,000 Units by mouth daily. .  Coenzyme Q10 (COQ10) 200 MG CAPS, Take 200 mg by mouth daily. [name]  lidocaine (XYLOCAINE) 5 % ointment, Apply sparingly up to TID for pain .  vitamin E 400 UNIT capsule, Take 400 Units by mouth daily. .  traZODone (DESYREL) 50 MG tablet, 1/2-1 tablet at bedtime   Reviewed prior external information including notes and imaging from  primary care provider As well as notes that were available from care everywhere and other healthcare systems.  Past medical history, social, surgical and family history all reviewed in electronic medical record.  No pertanent information unless stated regarding to the chief complaint.   Review of Systems:  No headache, visual changes, nausea, vomiting, diarrhea, constipation, dizziness, abdominal pain, skin rash, fevers, chills, night sweats, weight loss, swollen lymph nodes, body aches,, chest pain,  shortness of breath, mood changes. POSITIVE muscle aches, joint swelling  Objective  Blood pressure 100/72, pulse 73, height 6' (1.829 m), weight 197 lb (89.4 kg), SpO2 98 %.   General: No apparent distress alert and oriented x3 mood and affect normal, dressed appropriately.  HEENT: Pupils equal, extraocular movements intact  Respiratory: Patient's speak in full sentences and does not appear short of breath  Cardiovascular: No lower extremity edema, non tender, no erythema  Patient does have a very large supraclavicular lymph nodes noted bilaterally. Gait mild antalgic MSK: Right shoulder exam does have some atrophy of the shoulders bilaterally.  Once again supraclavicular lymph nodes are enlarged.  Patient does have decreased range of motion with voluntary and involuntary guarding of the right shoulder.  Mild atrophy of the musculature noted of the shoulder girdle.  Patient does have positive Hawkins.  Patient does have a positive empty can sign that is fairly severe in 3-5 strength.  Limited musculoskeletal ultrasound was performed and interpreted by Lyndal Pulley  Limited ultrasound shows that patient does have a large muscular tendon rupture noted of the bicep tendon with hypoechoic changes.  Patient also has a very small partial tear noted of the pectoralis tendon.  Moderate to severe high-grade tear of the supraspinatus also noted.  Moderate arthritic changes of the acromioclavicular joint noted as well.  [id number]; 15 additional minutes spent for Therapeutic exercises as stated in above notes.  This included exercises focusing on stretching, strengthening, with significant focus on eccentric aspects.   Long term goals include an improvement in range of motion, strength, endurance as well as avoiding reinjury. Patient's frequency would include in 1-2 times a day, 3-5 times a week for a duration of 6-12 weeks. Shoulder Exercises that included:  Basic scapular stabilization to include adduction  and depression of scapula Scaption, focusing on proper movement and good control Internal and External rotation utilizing a theraband, with elbow tucked at side entire time Rows with theraband    Proper technique shown and discussed handout in great detail with ATC.  All questions were discussed and answered.      Impression and Recommendations:  The above documentation has been reviewed and is accurate and complete Lyndal Pulley, DO       Note: This dictation was prepared with Dragon dictation along with smaller phrase technology. Any transcriptional errors that result from this process are unintentional.

## 2019-09-20 ENCOUNTER — Encounter: Payer: Self-pay | Admitting: Oncology

## 2019-09-20 ENCOUNTER — Encounter: Payer: Self-pay | Admitting: Family Medicine

## 2019-09-21 ENCOUNTER — Inpatient Hospital Stay: Payer: PPO | Attending: Oncology

## 2019-09-21 ENCOUNTER — Inpatient Hospital Stay: Payer: PPO | Admitting: Nutrition

## 2019-09-21 ENCOUNTER — Encounter: Payer: Self-pay | Admitting: Oncology

## 2019-09-21 ENCOUNTER — Other Ambulatory Visit: Payer: Self-pay

## 2019-09-21 DIAGNOSIS — Z7982 Long term (current) use of aspirin: Secondary | ICD-10-CM | POA: Insufficient documentation

## 2019-09-21 DIAGNOSIS — C652 Malignant neoplasm of left renal pelvis: Secondary | ICD-10-CM | POA: Insufficient documentation

## 2019-09-21 DIAGNOSIS — Z5111 Encounter for antineoplastic chemotherapy: Secondary | ICD-10-CM | POA: Insufficient documentation

## 2019-09-21 DIAGNOSIS — R319 Hematuria, unspecified: Secondary | ICD-10-CM | POA: Insufficient documentation

## 2019-09-21 DIAGNOSIS — Z79899 Other long term (current) drug therapy: Secondary | ICD-10-CM | POA: Insufficient documentation

## 2019-09-21 DIAGNOSIS — R5383 Other fatigue: Secondary | ICD-10-CM | POA: Insufficient documentation

## 2019-09-21 MED ORDER — PROCHLORPERAZINE MALEATE 10 MG PO TABS
10.0000 mg | ORAL_TABLET | Freq: Four times a day (QID) | ORAL | 0 refills | Status: DC | PRN
Start: 2019-09-21 — End: 2019-09-25

## 2019-09-21 MED ORDER — LIDOCAINE-PRILOCAINE 2.5-2.5 % EX CREA
1.0000 | TOPICAL_CREAM | CUTANEOUS | 0 refills | Status: DC | PRN
Start: 2019-09-21 — End: 2019-10-31

## 2019-09-21 NOTE — Progress Notes (Signed)
Met with patient at registration to introduce myself as Financial Resource Specialist and to offer available resources.  Discussed one-time $1000 [employer] grant and qualifications to assist with personal expenses while going through treatment.  Gave him my card if interested in applying and for any additional financial questions or concerns.  

## 2019-09-21 NOTE — Progress Notes (Signed)
74 year old male diagnosed with bladder cancer.  He is a patient of Dr. Alen Blew.  He will receive gemcitabine and cisplatin.  Past medical history includes CAD, prostate cancer, MI, hypertension, hypercholesterolemia, and hiatal hernia.  Medications include vitamin D, coenzyme Q 10, vitamin D, and Compazine.  Labs were reviewed.  Height: 6 feet 0 inches. Weight: 197 pounds June 1. Usual body weight: 208 pounds in April 2021. BMI: 26.72.  Patient endorses weight loss secondary to poor appetite.  He has received chemo education and feels a bit overwhelmed today.  He is here with his wife.  Nutrition focused physical exam deferred.  Nutrition diagnosis: Food and nutrition related knowledge deficit related to new diagnosis of bladder cancer and associated treatments as evidenced by no prior need for nutrition related information.  Intervention: Educated patient on importance of adequate calories and protein for weight maintenance. Reviewed high-protein foods. Encouraged small amounts of food throughout the day. Educated patient on strategies for eating if he develops nausea. Encouraged increased fluids. Provided fact sheets on increasing calories and protein, soft moist protein foods, poor appetite, and nausea and vomiting. Questions were answered.  Teach back method used.  Contact information provided.  Monitoring, evaluation, goals: Patient will tolerate adequate calories and protein to minimize weight loss.  Next visit: Patient will contact me for questions or concerns.  **Disclaimer: This note was dictated with voice recognition software. Similar sounding words can inadvertently be transcribed and this note may contain transcription errors which may not have been corrected upon publication of note.**

## 2019-09-22 ENCOUNTER — Other Ambulatory Visit: Payer: Self-pay

## 2019-09-22 ENCOUNTER — Encounter: Payer: Self-pay | Admitting: Family Medicine

## 2019-09-22 ENCOUNTER — Ambulatory Visit: Payer: PPO | Admitting: Family Medicine

## 2019-09-22 ENCOUNTER — Ambulatory Visit (INDEPENDENT_AMBULATORY_CARE_PROVIDER_SITE_OTHER): Payer: PPO | Admitting: Family Medicine

## 2019-09-22 VITALS — BP 140/70 | HR 81 | Temp 98.0°F | Ht 72.0 in | Wt 197.5 lb

## 2019-09-22 DIAGNOSIS — S46211S Strain of muscle, fascia and tendon of other parts of biceps, right arm, sequela: Secondary | ICD-10-CM

## 2019-09-22 DIAGNOSIS — I10 Essential (primary) hypertension: Secondary | ICD-10-CM

## 2019-09-22 DIAGNOSIS — S46011A Strain of muscle(s) and tendon(s) of the rotator cuff of right shoulder, initial encounter: Secondary | ICD-10-CM

## 2019-09-22 NOTE — Patient Instructions (Signed)
Free app: insight timer 

## 2019-09-22 NOTE — Progress Notes (Signed)
Lawrence Hall DOB: November 29, 1945 Encounter date: 09/22/2019  This is a 74 y.o. male who presents with Chief Complaint  Patient presents with   Follow-up    History of present illness: Did have some follow up questions. He has been using the topical cream prescribed. Twice day does ice pack treatment and then uses cream. Occasionally uses heating pad. States that since steroid injection there have been days that feel like he is completely healed. But, there are days where pain seems more in wrist and right forearm.   Pain level overall is better. Feels it most in morning - hard to position himself comfortably, especially in the morning. Once he is up and moving around within 30-45 min he feels better. Just minor discomfort in forearm. Was given a lot of exercises from Dr. Tamala Julian - wall crawl, curls (without weights).   Big appointment is Monday with cardiology. If cleared then by Friday he will have first chemo treatment. Yesterday had 2 hours of chemo education. 3 most important things he got out: hydrate, eat bunches of small meals; more grazing, and exercise as much as possible.   Sleeping better since last visit - able to sleep more than twice as long.   Since bypass 18 mo ago will be sitting and will feel bp elevation and then snaps. When he came out of surgery then COVID started so he didn't even get to do rehab. Abigail Butts told him yesterday to ask today about medication, meditation to help with this.     Allergies  Allergen Reactions   Other Itching and Other (See Comments)    Pet dander, seasonal allergies = Runny nose, itchy eyes   Current Meds  Medication Sig   atorvastatin (LIPITOR) 80 MG tablet TAKE 1 TABLET ONCE DAILY AT 6 PM. (Patient taking differently: Take 80 mg by mouth daily at 6 PM. )   chlorhexidine (PERIDEX) 0.12 % solution Rinse with 15 mls twice daily for 30 seconds. Use after breakfast and at bedtime. Spit out excess. Do not swallow. (Patient taking differently: Use  as directed 15 mLs in the mouth or throat 2 (two) times daily. )   Cholecalciferol (VITAMIN D-3) 125 MCG (5000 UT) TABS Take 5,000 Units by mouth daily.   Coenzyme Q10 (COQ10) 200 MG CAPS Take 200 mg by mouth daily.   lidocaine (XYLOCAINE) 5 % ointment Apply sparingly up to TID for pain   lidocaine-prilocaine (EMLA) cream Apply 1 application topically as needed.   metoprolol succinate (TOPROL-XL) 100 MG 24 hr tablet Take 100 mg by mouth daily. Take with or immediately following a meal.   nitroGLYCERIN (NITROSTAT) 0.4 MG SL tablet 1 TAB UNDER TONGUE AS NEEDED FOR CHEST PAIN. MAY REPEAT EVERY 5 MIN FOR A TOTAL OF 3 DOSES. (Patient taking differently: Place 0.4 mg under the tongue every 5 (five) minutes as needed for chest pain. )   prochlorperazine (COMPAZINE) 10 MG tablet Take 1 tablet (10 mg total) by mouth every 6 (six) hours as needed for nausea or vomiting.   traZODone (DESYREL) 50 MG tablet 1/2-1 tablet at bedtime   vitamin E 400 UNIT capsule Take 400 Units by mouth daily.    Review of Systems  Constitutional: Negative for chills, fatigue and fever.  Respiratory: Negative for cough, chest tightness, shortness of breath and wheezing.   Cardiovascular: Negative for chest pain, palpitations and leg swelling.  Musculoskeletal: Positive for arthralgias (improving).    Objective:  BP 140/70    Pulse 81  Temp 98 F (36.7 C) (Temporal)    Ht 6' (1.829 m)    Wt 197 lb 8 oz (89.6 kg)    BMI 26.79 kg/m   Weight: 197 lb 8 oz (89.6 kg)   BP Readings from Last 3 Encounters:  09/22/19 140/70  09/19/19 100/72  09/15/19 122/64   Wt Readings from Last 3 Encounters:  09/22/19 197 lb 8 oz (89.6 kg)  09/19/19 197 lb (89.4 kg)  09/15/19 197 lb 8 oz (89.6 kg)    Physical Exam Constitutional:      General: He is not in acute distress.    Appearance: He is well-developed.  Cardiovascular:     Rate and Rhythm: Normal rate and regular rhythm.     Heart sounds: Normal heart sounds. No  murmur. No friction rub.  Pulmonary:     Effort: Pulmonary effort is normal. No respiratory distress.     Breath sounds: Normal breath sounds. No wheezing or rales.  Musculoskeletal:     Right lower leg: No edema.     Left lower leg: No edema.  Neurological:     Mental Status: He is alert and oriented to person, place, and time.  Psychiatric:        Behavior: Behavior normal.     Assessment/Plan  1. Rupture of right biceps tendon, sequela Continue with rehab at home, icing, topical cream.   2. Traumatic tear of right rotator cuff, unspecified tear extent, initial encounter See above. He has had improvement in sx.   3. Essential hypertension Elevated today; encouraged to monitor at home. Has follow up on Monday w cardiology.   Return in about 3 months (around 12/23/2019) for Chronic condition visit.    [name], MD

## 2019-09-25 ENCOUNTER — Other Ambulatory Visit: Payer: Self-pay | Admitting: Oncology

## 2019-09-25 ENCOUNTER — Other Ambulatory Visit: Payer: Self-pay

## 2019-09-25 ENCOUNTER — Ambulatory Visit: Payer: PPO | Admitting: Medical

## 2019-09-25 ENCOUNTER — Encounter: Payer: Self-pay | Admitting: Medical

## 2019-09-25 VITALS — BP 150/84 | HR 96 | Ht 72.0 in | Wt 195.8 lb

## 2019-09-25 DIAGNOSIS — E7800 Pure hypercholesterolemia, unspecified: Secondary | ICD-10-CM

## 2019-09-25 DIAGNOSIS — Z01810 Encounter for preprocedural cardiovascular examination: Secondary | ICD-10-CM

## 2019-09-25 DIAGNOSIS — I4891 Unspecified atrial fibrillation: Secondary | ICD-10-CM | POA: Diagnosis not present

## 2019-09-25 DIAGNOSIS — Z951 Presence of aortocoronary bypass graft: Secondary | ICD-10-CM

## 2019-09-25 DIAGNOSIS — I10 Essential (primary) hypertension: Secondary | ICD-10-CM

## 2019-09-25 DIAGNOSIS — I9789 Other postprocedural complications and disorders of the circulatory system, not elsewhere classified: Secondary | ICD-10-CM | POA: Diagnosis not present

## 2019-09-25 DIAGNOSIS — I2510 Atherosclerotic heart disease of native coronary artery without angina pectoris: Secondary | ICD-10-CM | POA: Diagnosis not present

## 2019-09-25 DIAGNOSIS — Z953 Presence of xenogenic heart valve: Secondary | ICD-10-CM | POA: Diagnosis not present

## 2019-09-25 MED ORDER — AMLODIPINE BESYLATE 2.5 MG PO TABS
2.5000 mg | ORAL_TABLET | Freq: Every day | ORAL | 6 refills | Status: DC
Start: 2019-09-25 — End: 2020-05-15

## 2019-09-25 NOTE — Telephone Encounter (Signed)
Treatment question

## 2019-09-25 NOTE — Progress Notes (Signed)
Multiple attempts today to reach the patient via phone to answer his questions and concerns have failed.  I was not able to reach him via home phone number as well as cellular phone number as well as his wife's number.  I understand he has questions and concerns regarding starting chemotherapy in the future I will attempt to reach him again in the next 24 hours to address these issues promptly.

## 2019-09-25 NOTE — Patient Instructions (Signed)
Medication Instructions:  START- Amlodipine 2.5 mg by mouth daily  *If you need a refill on your cardiac medications before your next appointment, please call your pharmacy*   Lab Work: None Ordered   Testing/Procedures: None Ordered   Follow-Up: At [facility], you and your health needs are our priority.  As part of our continuing mission to provide you with exceptional heart care, we have created designated Provider Care Teams.  These Care Teams include your primary Cardiologist (physician) and Advanced Practice Providers (APPs -  Physician Assistants and Nurse Practitioners) who all work together to provide you with the care you need, when you need it.  We recommend signing up for the patient portal called "MyChart".  Sign up information is provided on this After Visit Summary.  MyChart is used to connect with patients for Virtual Visits (Telemedicine).  Patients are able to view lab/test results, encounter notes, upcoming appointments, etc.  Non-urgent messages can be sent to your provider as well.   To learn more about what you can do with MyChart, go to [email or web address].    Your next appointment:   3 month(s)   The format for your next appointment:   In Person  Provider:   Sanda Klein, MD

## 2019-09-25 NOTE — Progress Notes (Signed)
Cardiology Office Note   Date:  09/25/2019   ID:  Lawrence Hall, DOB 06-27-1945, MRN [id number]  PCP:  [name], MD  Cardiologist:  Sanda Klein, MD EP: None  Chief Complaint  Patient presents with  . Pre-op Exam      History of Present Illness: Lawrence Hall is a 74 y.o. male with PMH of CAD s/p PCI/DES to pLCx in 2017 with subsequent CABG in 04/2018, aortic stenosis s/p AVR, post-op atrial fibrillation, LBBB, HTN, HLD, who presents for preoperative assessment.  He was last evaluated by cardiology at an outpatient visit with Dr. Sallyanne Kuster 03/2019, at which time he was doing well from a cardiac standpoint, though had recently had a bought of food poisoning with complaints of hematuria since that time with plans to f/u with urology in the coming days. No medication changes occurred and he was recommended to follow-up in 1 year. He was subsequently seen by urology and diagnosed with urothelial carcinoma of the left kidney. He is following with oncology with plans to start chemotherapy in the coming weeks. He is planned for a left nephrouretectomy. Per a MyChart message 08/13/19, Dr. Sallyanne Kuster feels his heart is in good condition to handle surgery well and instructed the patient to continue metoprolol in the perioperative setting. His last echocardiogram 02/2019 showed EF 55-60%, mild LVH, G1DD, and no significant valvular abnormalities with stable aortic valve s/p replacement. His last ischemic evaluation was a LHC prior to his CABG 04/2018.  He presents today with his wife. He has been under increased stress with the recent diagnosis of urothelial carcinoma. After further discussion with his oncologist, the plan is for chemotherapy followed by surgery. He is anticipating 4 cycles of chemotherapy, each 21 days long. He thinks surgery will be mid September at the earliest. He has been more anxious and irritable. Seen by PCP and offered medications to help with anxiety and likely  depression, though he preferred mindful meditation as a coping strategy first. As a result of the increased stress, his blood pressure has been elevated. Often in the 140s-150s/70s-80s at outpatient visits. Admittedly he has not been monitoring his BP at home often, though thinks it is typically over goal of <130/80. Additionally he was recently diagnosed with a bicep tendon tear with associated rotator cuff injury for which he is following with orthopedics. Recent steroid injection has alleviated some of his nighttime discomfort and improved his sleep. He has no complaints of chest pain, SOB, dizziness, lightheadedness, syncope, palpitations, orthopnea, PND, or LE edema.   Past Medical History:  Diagnosis Date  . Arthritis    mild hands  . Asthma    pt denies 4/21   . Chest pain 07/25/2012  . Coronary artery disease   . Dysrhythmia    PAC'S, hx of atrial fib after OHS 04/2018  . ED (erectile dysfunction)   . H/O hiatal hernia   . Heart murmur   . Hypercholesterolemia   . Hypertension    MARGINALLY HIGH - NO MEDS -MONITORS  . Myocardial infarction ([place])    2017   . Pneumonia    hx of 2018   . PONV (postoperative nausea and vomiting)   . Prostate cancer ([place]) 03/27/2011   prostate bx=adenocarcinoma,  . Skin cancer 2008   basal cell face /removed  . Sleep apnea    borderline sleep apnea per patient no devices     Past Surgical History:  Procedure Laterality Date  . AORTIC VALVE REPLACEMENT N/A  04/29/2018   Procedure: AORTIC VALVE REPLACEMENT (AVR) using INSPIRIS RESILIA  AORTIC VALVE 68mm;  Surgeon: Ivin Poot, MD;  Location: [facility];  Service: Open Heart Surgery;  Laterality: N/A;  . basal cell cancer  2008   inside nose  . CARDIAC CATHETERIZATION N/A 06/03/2015   Procedure: Left Heart Cath and Coronary Angiography;  Surgeon: [name] Sine, MD;  Location: [facility];  Service: Cardiovascular;  Laterality: N/A;  . CATARACT EXTRACTION, BILATERAL  2007  . CORONARY ARTERY  BYPASS GRAFT N/A 04/29/2018   Procedure: CORONARY ARTERY BYPASS GRAFTING (CABG) X 3; Using Left Internal Mammary Artery (LIMA) and Right Leg Greater Saphenous Vein Graft (SVG);  LIMA to LAD, SVG to OM1, SVG to RCA,;  Surgeon: Ivin Poot, MD;  Location: [facility];  Service: Open Heart Surgery;  Laterality: N/A;  . CYSTOSCOPY/RETROGRADE/URETEROSCOPY Left 08/21/2019   Procedure: CYSTOSCOPY/RETROGRADE/URETEROSCOPY/  LEFT BIOPSY/BRUSH BIOPSY/RENAL WASHINGS/ AND STENT PLACEMENT;  Surgeon: Raynelle Bring, MD;  [facility];  Service: Urology;  Laterality: Left;  . RIGHT/LEFT HEART CATH AND CORONARY ANGIOGRAPHY N/A 04/26/2018   Procedure: RIGHT/LEFT HEART CATH AND CORONARY ANGIOGRAPHY;  Surgeon: [name] Sine, MD;  Location: [facility];  Service: Cardiovascular;  Laterality: N/A;  . ROBOT ASSISTED LAPAROSCOPIC RADICAL PROSTATECTOMY  08/10/2011   Procedure: ROBOTIC ASSISTED LAPAROSCOPIC RADICAL PROSTATECTOMY LEVEL 2;  Surgeon: Dutch Gray, MD;  [facility];  Service: Urology;  Laterality: N/A;  . TEE WITHOUT CARDIOVERSION N/A 04/29/2018   Procedure: TRANSESOPHAGEAL ECHOCARDIOGRAM (TEE);  Surgeon: Prescott Gum, Collier Salina, MD;  Location: [facility];  Service: Open Heart Surgery;  Laterality: N/A;  . THYROID SURGERY  infant   uncertain details; no thyroid replacement listed     Current Outpatient Medications  Medication Sig Dispense Refill  . atorvastatin (LIPITOR) 80 MG tablet TAKE 1 TABLET ONCE DAILY AT 6 PM. (Patient taking differently: Take 80 mg by mouth daily at 6 PM. ) 90 tablet 2  . chlorhexidine (PERIDEX) 0.12 % solution Rinse with 15 mls twice daily for 30 seconds. Use after breakfast and at bedtime. Spit out excess. Do not swallow. (Patient taking differently: Use as directed 15 mLs in the mouth or throat 2 (two) times daily. ) 960 mL 5  . Cholecalciferol (VITAMIN D-3) 125 MCG (5000 UT) TABS Take 5,000 Units by mouth daily.    . Coenzyme Q10 (COQ10) 200 MG CAPS Take 200 mg by mouth daily.    [name]  lidocaine-prilocaine (EMLA) cream Apply 1 application topically as needed. 30 g 0  . metoprolol succinate (TOPROL-XL) 100 MG 24 hr tablet Take 100 mg by mouth daily. Take with or immediately following a meal.    . nitroGLYCERIN (NITROSTAT) 0.4 MG SL tablet 1 TAB UNDER TONGUE AS NEEDED FOR CHEST PAIN. MAY REPEAT EVERY 5 MIN FOR A TOTAL OF 3 DOSES. (Patient taking differently: Place 0.4 mg under the tongue every 5 (five) minutes as needed for chest pain. ) 25 tablet 4  . traZODone (DESYREL) 50 MG tablet 1/2-1 tablet at bedtime 30 tablet 0  . vitamin E 400 UNIT capsule Take 400 Units by mouth daily.    [name] amLODipine (NORVASC) 2.5 MG tablet Take 1 tablet (2.5 mg total) by mouth daily. 30 tablet 6   No current facility-administered medications for this visit.    Allergies:   Other    Social History:  The patient  reports that he has never smoked. He has never used smokeless tobacco. He reports previous alcohol use. He  reports that he does not use drugs.   Family History:  The patient's family history includes Alcohol abuse in his father; Breast cancer in his sister; Breast cancer (age of onset: 42) in his mother; CAD in his brother; Cervical cancer in his sister and sister; Heart attack (age of onset: 23) in his father; Liver disease in his father; Prostate cancer (age of onset: 17) in his father.    ROS:  Please see the history of present illness.   Otherwise, review of systems are positive for none.   All other systems are reviewed and negative.    PHYSICAL EXAM: VS:  BP (!) 150/84   Pulse 96   Ht 6' (1.829 m)   Wt 195 lb 12.8 oz (88.8 kg)   SpO2 95%   BMI 26.56 kg/m  , BMI Body mass index is 26.56 kg/m. GEN: Well nourished, well developed, in no acute distress HEENT: sclera anicteric Neck: no JVD, carotid bruits, or masses Cardiac: RRR; +murmur, no rubs or gallops, no edema  Respiratory:  clear to auscultation bilaterally, normal work of breathing GI: soft, nontender, nondistended, +  BS MS: no deformity or atrophy Skin: warm and dry, no rash Neuro:  Strength and sensation are intact Psych: euthymic mood, full affect   EKG:  EKG is ordered today. The ekg ordered today demonstrates sinus rhythm with 1st degree AV block, rate 96 bpm, chronic LBBB, no significant change from previous.      Recent Labs: 04/07/2019: ALT 6 08/15/2019: BUN 25; Creatinine, Ser 1.11; Hemoglobin 12.5; Platelets 251; Potassium 4.2; Sodium 139    Lipid Panel    Component Value Date/Time   CHOL 136 04/07/2019 1021   TRIG 127 04/07/2019 1021   HDL 42 04/07/2019 1021   CHOLHDL 3.2 04/07/2019 1021   CHOLHDL 5.0 06/01/2015 1202   VLDL 52 (H) 06/01/2015 1202   LDLCALC 71 04/07/2019 1021      Wt Readings from Last 3 Encounters:  09/25/19 195 lb 12.8 oz (88.8 kg)  09/22/19 197 lb 8 oz (89.6 kg)  09/19/19 197 lb (89.4 kg)      Other studies Reviewed: Additional studies/ records that were reviewed today include:   ECHO 02/21/2019:  1. Left ventricular ejection fraction, by visual estimation, is 55 to 60%. The left ventricle has normal function. There is mildly increased left ventricular hypertrophy. 2. Left ventricular diastolic parameters are consistent with Grade I diastolic dysfunction (impaired relaxation). 3. Global right ventricle has normal systolic function.The right ventricular size is normal. No increase in right ventricular wall thickness. 4. Left atrial size was normal. 5. Right atrial size was normal. 6. The mitral valve is normal in structure. Trace mitral valve regurgitation. No evidence of mitral stenosis. 7. The tricuspid valve is normal in structure. Tricuspid valve regurgitation is mild. 8. The aortic valve is normal in structure. Aortic valve regurgitation is not visualized. No evidence of aortic valve sclerosis or stenosis. 9. The pulmonic valve was normal in structure. Pulmonic valve regurgitation is not visualized. 10. Mildly elevated pulmonary artery  systolic pressure. 11. The inferior vena cava is normal in size with greater than 50% respiratory variability, suggesting right atrial pressure of 3 mmHg.  R/LHC 04/2018:  Prox LAD lesion is 50% stenosed.  Mid LM to Dist LM lesion is 70% stenosed.  Previously placed Prox Cx stent (unknown type) is widely patent.  Prox RCA to Mid RCA lesion is 75% stenosed.   Multivessel CAD with smooth eccentric 70% distal left main stenosis; 50%  proximal LAD stenosis; patent left circumflex stent; and 75% proximal RCA stenosis.  Normal right heart pressures.  Calcified aortic valve with markedly reduced excursion by aortography.  Moderate aortic stenosis with a peak instantaneous gradient of 36, peak to peak gradient of 31, and mean gradient of 26 mmHg with a valve area of 1.1 cm.  RECOMMENDATION: Surgical consultation for AVR/CABG revascularization.   ASSESSMENT AND PLAN:  1. Preoperative assessment: anticipating a left robot assisted laparoscopic nephrouretectomy, though now this is going to be completed after 4 cycles of chemotherapy. Likely surgery will be mid September at the earliest. Thankfully he has no anginal complaints and can complete 4 METs without difficulty. Given that surgery is >2 months out, will ask the surgeons office to reach out closer to the time of surgery in an effort to better assess his cardiovascular status following chemotherapy.  - Will notify Dr. Alinda Money of need to resubmit a preop request closer to the time of surgery. - If no change in symptoms, anticipate he will be find to hold aspirin 5 days prior to surgery.  - Patient asked for an in-office evaluation prior to surgery, therefore will plan for a follow-up visit with Dr. Sallyanne Kuster late August 2021.   2. [name] 04/2018: no anginal complaints - Continue aspirin and statin - Continue BBlocker  3. Aortic stenosis s/p AVR 04/2018: moderate AS noted on echo prior to CABG. Surveillance echo 02/2019 with stable  valve - Continue routine monitoring - will update his echo 02/2020  4. Post-op atrial fibrillation: occurred following CABG. No longer on anticoagulation or amiodarone. No complaints of palpitations - Continue to monitor for reoccurrence  5. LBBB/1st degree AV block: noted again on EKG today. No symptoms to suggest high-grade AV block - Continue to monitor routinely.  - Cautiously continue BBlocker  6. HTN: BP 150/84 today - Will start low dose amlodipine 2.5mg  daily - patient to keep a log of blood pressures over the next 1-2 weeks and notify the office if not at goal of <130/80 so addition recommendations can be provided.  - Continue metoprolol succinate  6. HLD: LDL 71 03/2019 - Continue atorvastatin   Current medicines are reviewed at length with the patient today.  The patient does not have concerns regarding medicines.  The following changes have been made:  As above  Labs/ tests ordered today include:   Orders Placed This Encounter  Procedures  . EKG 12-Lead     Disposition:   FU with Dr. Sallyanne Kuster in 3 months  Signed, Abigail Butts, PA-C  09/25/2019 2:37 PM

## 2019-09-26 ENCOUNTER — Other Ambulatory Visit: Payer: Self-pay | Admitting: Cardiovascular Disease

## 2019-09-26 ENCOUNTER — Telehealth: Payer: Self-pay | Admitting: Oncology

## 2019-09-26 ENCOUNTER — Other Ambulatory Visit: Payer: Self-pay | Admitting: Oncology

## 2019-09-26 NOTE — Telephone Encounter (Signed)
Scheduled appt per 6/8 sch message - UNABLE  To reach pt ,. Left message for patient with appt date and time

## 2019-09-27 ENCOUNTER — Telehealth: Payer: Self-pay | Admitting: *Deleted

## 2019-09-28 ENCOUNTER — Encounter (HOSPITAL_COMMUNITY): Payer: Self-pay

## 2019-09-28 ENCOUNTER — Ambulatory Visit (HOSPITAL_COMMUNITY)
Admission: RE | Admit: 2019-09-28 | Discharge: 2019-09-28 | Disposition: A | Discharge status: Home / Self Care | Payer: PPO | Source: Ambulatory Visit | Attending: Oncology | Admitting: Oncology

## 2019-09-28 ENCOUNTER — Other Ambulatory Visit: Payer: Self-pay | Admitting: Oncology

## 2019-09-28 ENCOUNTER — Ambulatory Visit (HOSPITAL_COMMUNITY)
Admission: RE | Admit: 2019-09-28 | Discharge: 2019-09-28 | Disposition: A | Discharge status: Home / Self Care | Payer: PPO | Source: Ambulatory Visit | Attending: Urology | Admitting: Urology

## 2019-09-28 ENCOUNTER — Other Ambulatory Visit: Payer: Self-pay

## 2019-09-28 DIAGNOSIS — E7800 Pure hypercholesterolemia, unspecified: Secondary | ICD-10-CM | POA: Diagnosis not present

## 2019-09-28 DIAGNOSIS — Z8546 Personal history of malignant neoplasm of prostate: Secondary | ICD-10-CM | POA: Diagnosis not present

## 2019-09-28 DIAGNOSIS — C679 Malignant neoplasm of bladder, unspecified: Secondary | ICD-10-CM | POA: Diagnosis not present

## 2019-09-28 DIAGNOSIS — Z5111 Encounter for antineoplastic chemotherapy: Secondary | ICD-10-CM | POA: Diagnosis not present

## 2019-09-28 DIAGNOSIS — G4730 Sleep apnea, unspecified: Secondary | ICD-10-CM | POA: Insufficient documentation

## 2019-09-28 DIAGNOSIS — I2510 Atherosclerotic heart disease of native coronary artery without angina pectoris: Secondary | ICD-10-CM | POA: Insufficient documentation

## 2019-09-28 DIAGNOSIS — I252 Old myocardial infarction: Secondary | ICD-10-CM | POA: Diagnosis not present

## 2019-09-28 DIAGNOSIS — Z452 Encounter for adjustment and management of vascular access device: Secondary | ICD-10-CM | POA: Diagnosis not present

## 2019-09-28 DIAGNOSIS — I10 Essential (primary) hypertension: Secondary | ICD-10-CM | POA: Diagnosis not present

## 2019-09-28 HISTORY — PX: IR IMAGING GUIDED PORT INSERTION: IMG5740

## 2019-09-28 MED ORDER — LIDOCAINE HCL (PF) 1 % IJ SOLN
INTRAMUSCULAR | Status: AC | PRN
  Administered 2019-09-28 (×2): 10 mL via INTRADERMAL

## 2019-09-28 MED ORDER — MIDAZOLAM HCL 2 MG/2ML IJ SOLN
INTRAMUSCULAR | Status: AC
  Filled 2019-09-28: qty 2

## 2019-09-28 MED ORDER — HEPARIN SOD (PORK) LOCK FLUSH 100 UNIT/ML IV SOLN
INTRAVENOUS | Status: AC | PRN
  Administered 2019-09-28: 500 [IU] via INTRAVENOUS

## 2019-09-28 MED ORDER — FENTANYL CITRATE (PF) 100 MCG/2ML IJ SOLN
INTRAMUSCULAR | Status: AC
  Filled 2019-09-28: qty 2

## 2019-09-28 MED ORDER — CEFAZOLIN SODIUM-DEXTROSE 2-4 GM/100ML-% IV SOLN
INTRAVENOUS | Status: AC
  Administered 2019-09-28: 2 g via INTRAVENOUS
  Filled 2019-09-28: qty 100

## 2019-09-28 MED ORDER — HEPARIN SOD (PORK) LOCK FLUSH 100 UNIT/ML IV SOLN
INTRAVENOUS | Status: AC
  Filled 2019-09-28: qty 5

## 2019-09-28 MED ORDER — SODIUM CHLORIDE 0.9 % IV SOLN: INTRAVENOUS | Status: DC

## 2019-09-28 MED ORDER — CEFAZOLIN SODIUM-DEXTROSE 2-4 GM/100ML-% IV SOLN: 2.0000 g | Freq: Once | INTRAVENOUS | Status: AC

## 2019-09-28 MED ORDER — LIDOCAINE HCL 1 % IJ SOLN
INTRAMUSCULAR | Status: AC
  Filled 2019-09-28: qty 20

## 2019-09-28 MED ORDER — FENTANYL CITRATE (PF) 100 MCG/2ML IJ SOLN
INTRAMUSCULAR | Status: AC | PRN
  Administered 2019-09-28: 25 ug via INTRAVENOUS
  Administered 2019-09-28: 50 ug via INTRAVENOUS
  Administered 2019-09-28: 25 ug via INTRAVENOUS

## 2019-09-28 MED ORDER — MIDAZOLAM HCL 2 MG/2ML IJ SOLN
INTRAMUSCULAR | Status: AC | PRN
  Administered 2019-09-28 (×2): 0.5 mg via INTRAVENOUS
  Administered 2019-09-28: 1 mg via INTRAVENOUS

## 2019-09-28 NOTE — Procedures (Signed)
Interventional Radiology Procedure Note  Procedure: Single Lumen Power Port Placement    Access:  Right IJ vein.  Findings: Catheter tip positioned at SVC/RA junction. Port is ready for immediate use.   Complications: None  EBL: < 10 mL  Recommendations:  - Ok to shower in 24 hours - Do not submerge for 7 days - Routine line care   Amine Adelson T. Mareli Antunes, M.D Pager:  [phone]   

## 2019-09-28 NOTE — H&P (Signed)
Chief Complaint: Patient was seen in consultation today for bladder cancer/Port-a-cath placement.  Referring Physician(s): Wyatt Portela  Supervising Physician: Aletta Edouard  Patient Status: [facility] - Out-pt  History of Present Illness: Lawrence Hall is a 74 y.o. male with a past medical history of hypertension, hypercholesterolemia, PACs, MI 2017, CAD, asthma, pneumonia, hiatal hernia, ED, bladder cancer, prostate cancer, and arthritis. He was unfortunately diagnosed with bladder cancer in 08/2019. His cancer is managed by Dr. Alen Blew. He has tentative plans to begin systemic chemotherapy as management.  IR requested by Dr. Alen Blew for possible image-guided Port-a-cath placement. Patient awake and alert sitting in bed with no complaints at this time. Denies fever, chills, chest pain, dyspnea, abdominal pain, or headache.   Past Medical History:  Diagnosis Date  . Arthritis    mild hands  . Asthma    pt denies 4/21   . Chest pain 07/25/2012  . Coronary artery disease   . Dysrhythmia    PAC'S, hx of atrial fib after OHS 04/2018  . ED (erectile dysfunction)   . H/O hiatal hernia   . Heart murmur   . Hypercholesterolemia   . Hypertension    MARGINALLY HIGH - NO MEDS -MONITORS  . Myocardial infarction ([place])    2017   . Pneumonia    hx of 2018   . PONV (postoperative nausea and vomiting)   . Prostate cancer ([place]) 03/27/2011   prostate bx=adenocarcinoma,  . Skin cancer 2008   basal cell face /removed  . Sleep apnea    borderline sleep apnea per patient no devices     Past Surgical History:  Procedure Laterality Date  . AORTIC VALVE REPLACEMENT N/A 04/29/2018   Procedure: AORTIC VALVE REPLACEMENT (AVR) using INSPIRIS RESILIA  AORTIC VALVE 63mm;  Surgeon: Ivin Poot, MD;  Location: [facility];  Service: Open Heart Surgery;  Laterality: N/A;  . basal cell cancer  2008   inside nose  . CARDIAC CATHETERIZATION N/A 06/03/2015   Procedure: Left Heart Cath and Coronary  Angiography;  Surgeon: [name] Sine, MD;  Location: [facility];  Service: Cardiovascular;  Laterality: N/A;  . CATARACT EXTRACTION, BILATERAL  2007  . CORONARY ARTERY BYPASS GRAFT N/A 04/29/2018   Procedure: CORONARY ARTERY BYPASS GRAFTING (CABG) X 3; Using Left Internal Mammary Artery (LIMA) and Right Leg Greater Saphenous Vein Graft (SVG);  LIMA to LAD, SVG to OM1, SVG to RCA,;  Surgeon: Ivin Poot, MD;  Location: [facility];  Service: Open Heart Surgery;  Laterality: N/A;  . CYSTOSCOPY/RETROGRADE/URETEROSCOPY Left 08/21/2019   Procedure: CYSTOSCOPY/RETROGRADE/URETEROSCOPY/  LEFT BIOPSY/BRUSH BIOPSY/RENAL WASHINGS/ AND STENT PLACEMENT;  Surgeon: Raynelle Bring, MD;  [facility];  Service: Urology;  Laterality: Left;  . RIGHT/LEFT HEART CATH AND CORONARY ANGIOGRAPHY N/A 04/26/2018   Procedure: RIGHT/LEFT HEART CATH AND CORONARY ANGIOGRAPHY;  Surgeon: [name] Sine, MD;  Location: [facility];  Service: Cardiovascular;  Laterality: N/A;  . ROBOT ASSISTED LAPAROSCOPIC RADICAL PROSTATECTOMY  08/10/2011   Procedure: ROBOTIC ASSISTED LAPAROSCOPIC RADICAL PROSTATECTOMY LEVEL 2;  Surgeon: Dutch Gray, MD;  [facility];  Service: Urology;  Laterality: N/A;  . TEE WITHOUT CARDIOVERSION N/A 04/29/2018   Procedure: TRANSESOPHAGEAL ECHOCARDIOGRAM (TEE);  Surgeon: Prescott Gum, Collier Salina, MD;  Location: [facility];  Service: Open Heart Surgery;  Laterality: N/A;  . THYROID SURGERY  infant   uncertain details; no thyroid replacement listed    Allergies: Other  Medications: Prior to Admission medications   Medication Sig Start Date End  Date Taking? Authorizing Provider  amLODipine (NORVASC) 2.5 MG tablet Take 1 tablet (2.5 mg total) by mouth daily. 09/25/19 12/24/19  Kroeger, Lorelee Cover., PA-C  atorvastatin (LIPITOR) 80 MG tablet TAKE 1 TABLET ONCE DAILY AT 6 PM. Patient taking differently: Take 80 mg by mouth daily at 6 PM.  06/30/19   [name], Dani Gobble, MD  chlorhexidine (PERIDEX) 0.12 % solution Rinse  with 15 mls twice daily for 30 seconds. Use after breakfast and at bedtime. Spit out excess. Do not swallow. Patient taking differently: Use as directed 15 mLs in the mouth or throat 2 (two) times daily.  05/09/18   Lenn Cal, DDS  Cholecalciferol (VITAMIN D-3) 125 MCG (5000 UT) TABS Take 5,000 Units by mouth daily.    [provider]  Coenzyme Q10 (COQ10) 200 MG CAPS Take 200 mg by mouth daily.    [provider]  lidocaine-prilocaine (EMLA) cream Apply 1 application topically as needed. 09/21/19   Wyatt Portela, MD  metoprolol succinate (TOPROL-XL) 100 MG 24 hr tablet TAKE 1 TABLET AT BEDTIME. TAKE WITH OR IMMEDIATELY FOLLOWING A MEAL. 09/26/19   [name], Mihai, MD  nitroGLYCERIN (NITROSTAT) 0.4 MG SL tablet 1 TAB UNDER TONGUE AS NEEDED FOR CHEST PAIN. MAY REPEAT EVERY 5 MIN FOR A TOTAL OF 3 DOSES. Patient taking differently: Place 0.4 mg under the tongue every 5 (five) minutes as needed for chest pain.  06/21/19   [name], Mihai, MD  traZODone (DESYREL) 50 MG tablet 1/2-1 tablet at bedtime 09/19/19   Lyndal Pulley, DO  vitamin E 400 UNIT capsule Take 400 Units by mouth daily.    [provider]     Family History  Problem Relation Age of Onset  . Prostate cancer Father 43       in his 70's/other comorbidities/79 deceased  . Alcohol abuse Father   . Liver disease Father   . Heart attack Father 38  . Breast cancer Mother 40       mets to bone,deceased age 90  . Cervical cancer Sister   . Cervical cancer Sister   . Breast cancer Sister   . CAD Brother        Died of sudden cardiac death/MI    Social History   Socioeconomic History  . Marital status: Married    Spouse name: [name]   . Number of children: 0  . Years of education: 15 years   . Highest education level: Not on file  Occupational History    Employer: [employer]    Comment: customer service [employer]  Tobacco Use  . Smoking status: Never Smoker  . Smokeless tobacco: Never Used    Vaping Use  . Vaping Use: Never used  Substance and Sexual Activity  . Alcohol use: Not Currently  . Drug use: No  . Sexual activity: Not on file  Other Topics Concern  . Not on file  Social History Narrative  . Not on file   Social Determinants of Health   Financial Resource Strain:   . Difficulty of Paying Living Expenses:   Food Insecurity:   . Worried About Charity fundraiser in the Last Year:   . Arboriculturist in the Last Year:   Transportation Needs:   . Film/video editor (Medical):   [name] Lack of Transportation (Non-Medical):   Physical Activity:   . Days of Exercise per Week:   . Minutes of Exercise per Session:   Stress:   . Feeling  of Stress :   Social Connections:   . Frequency of Communication with Friends and Family:   . Frequency of Social Gatherings with Friends and Family:   . Attends Religious Services:   . Active Member of Clubs or Organizations:   . Attends Archivist Meetings:   [name] Marital Status:      Review of Systems: A 12 point ROS discussed and pertinent positives are indicated in the HPI above.  All other systems are negative.  Review of Systems  Constitutional: Negative for chills and fever.  Respiratory: Negative for shortness of breath and wheezing.   Cardiovascular: Negative for chest pain and palpitations.  Gastrointestinal: Negative for abdominal pain.  Neurological: Negative for headaches.  Psychiatric/Behavioral: Negative for behavioral problems and confusion.    Vital Signs: BP 139/88   Pulse 63   Temp 97.7 F (36.5 C) (Oral)   Resp 18   SpO2 98%   Physical Exam Vitals and nursing note reviewed.  Constitutional:      General: He is not in acute distress.    Appearance: Normal appearance.  Cardiovascular:     Rate and Rhythm: Normal rate and regular rhythm.     Heart sounds: Murmur heard.   Pulmonary:     Effort: Pulmonary effort is normal. No respiratory distress.     Breath sounds: Normal breath  sounds. No wheezing.  Skin:    General: Skin is warm and dry.  Neurological:     Mental Status: He is alert and oriented to person, place, and time.      MD Evaluation Airway: WNL Heart: WNL Abdomen: WNL Chest/ Lungs: WNL ASA  Classification: 3 Mallampati/Airway Score: Two   Imaging: DG Cervical Spine Complete  Result Date: 09/19/2019 CLINICAL DATA:  Neck pain and shoulder pain 4 months EXAM: CERVICAL SPINE - COMPLETE 4+ VIEW COMPARISON:  None. FINDINGS: Straightening of the cervical lordosis without static listhesis. No acute fractures seen. Intervertebral disc height loss and degenerative endplate spurring is most prevalent at C3-4. Oblique views reveal bony foraminal narrowing most prevalent on the right at C3-4, C4-5, and C6-7. Left-sided neural foramina appear patent. Prevertebral soft tissues are within normal limits. Bilateral carotid calcifications. IMPRESSION: 1. Degenerative disc disease most prevalent at C3-4. 2. Bony foraminal narrowing on the right at C3-4, C4-5, and C6-7. Electronically Signed   By: Davina Poke D.O.   On: 09/19/2019 16:34   DG Shoulder Right  Result Date: 09/19/2019 CLINICAL DATA:  Right shoulder pain for 4 months without known injury. EXAM: RIGHT SHOULDER - 2+ VIEW COMPARISON:  None. FINDINGS: There is no evidence of fracture or dislocation. There is no evidence of arthropathy or other focal bone abnormality. Soft tissues are unremarkable. IMPRESSION: Negative. Electronically Signed   By: Marijo Conception M.D.   On: 09/19/2019 16:39    Labs:  CBC: Recent Labs    08/15/19 1031  WBC 6.5  HGB 12.5*  HCT 37.7*  PLT 251    COAGS: No results for input(s): INR, APTT in the last 8760 hours.  BMP: Recent Labs    04/07/19 1021 08/15/19 1031  NA 137 139  K 4.2 4.2  CL 102 106  CO2 21 23  GLUCOSE 106* 101*  BUN 29* 25*  CALCIUM 9.1 9.0  CREATININE 1.51* 1.11  GFRNONAA 45* >60  GFRAA 51* >60    LIVER FUNCTION TESTS: Recent Labs     04/07/19 1021  BILITOT 0.8  AST 10  ALT 6  ALKPHOS 70  PROT 6.9  ALBUMIN 4.1     Assessment and Plan:  Bladder cancer with tentative plans to begin systemic chemotherapy as management. Plan for image-guided Port-a-cath placement today in IR. Patient is NPO. Afebrile.  Risks and benefits of image-guided Port-a-catheter placement were discussed with the patient including, but not limited to bleeding, infection, pneumothorax, or fibrin sheath development and need for additional procedures. All of the patient's questions were answered, patient is agreeable to proceed. Consent signed and in chart.   Thank you for this interesting consult.  I greatly enjoyed meeting BERTIN INABINET and look forward to participating in their care.  A copy of this report was sent to the requesting provider on this date.  Electronically Signed: Earley Abide, PA-C 09/28/2019, 10:31 AM   I spent a total of 30 Minutes in face to face in clinical consultation, greater than 50% of which was counseling/coordinating care for bladder cancer/Port-a-cath placement.

## 2019-09-28 NOTE — Discharge Instructions (Signed)
DO NOT APPLY ANY EMLA CREAM OR LOTION TO THE PORT SITE X 2 WEEKS   DO NOT SHOWER X 24 HRS  FOR ANY QUESTIONS OR CONCERNS CALL [phone]  Moderate Conscious Sedation, Adult, Care After These instructions provide you with information about caring for yourself after your procedure. Your health care provider may also give you more specific instructions. Your treatment has been planned according to current medical practices, but problems sometimes occur. Call your health care provider if you have any problems or questions after your procedure. What can I expect after the procedure? After your procedure, it is common:  To feel sleepy for several hours.  To feel clumsy and have poor balance for several hours.  To have poor judgment for several hours.  To vomit if you eat too soon. Follow these instructions at home: For at least 24 hours after the procedure:   Do not: ? Participate in activities where you could fall or become injured. ? Drive. ? Use heavy machinery. ? Drink alcohol. ? Take sleeping pills or medicines that cause drowsiness. ? Make important decisions or sign legal documents. ? Take care of children on your own.  Rest. Eating and drinking  Follow the diet recommended by your health care provider.  If you vomit: ? Drink water, juice, or soup when you can drink without vomiting. ? Make sure you have little or no nausea before eating solid foods. General instructions  Have a responsible adult stay with you until you are awake and alert.  Take over-the-counter and prescription medicines only as told by your health care provider.  If you smoke, do not smoke without supervision.  Keep all follow-up visits as told by your health care provider. This is important. Contact a health care provider if:  You keep feeling nauseous or you keep vomiting.  You feel light-headed.  You develop a rash.  You have a fever. Get help right away if:  You have trouble  breathing. This information is not intended to replace advice given to you by your health care provider. Make sure you discuss any questions you have with your health care provider. Document Revised: 03/19/2017 Document Reviewed: 07/27/2015 Elsevier Patient Education  [place] Insertion, Care After This sheet gives you information about how to care for yourself after your procedure. Your health care provider may also give you more specific instructions. If you have problems or questions, contact your health care provider. What can I expect after the procedure? After the procedure, it is common to have:  Discomfort at the port insertion site.  Bruising on the skin over the port. This should improve over 3-4 days. Follow these instructions at home: [facility] care  After your port is placed, you will get a manufacturer's information card. The card has information about your port. Keep this card with you at all times.  Take care of the port as told by your health care provider. Ask your health care provider if you or a family member can get training for taking care of the port at home. A home health care nurse may also take care of the port.  Make sure to remember what type of port you have. Incision care      Follow instructions from your health care provider about how to take care of your port insertion site. Make sure you: ? Wash your hands with soap and water before and after you change your bandage (dressing). If soap and water are not available,  use hand sanitizer. ? Change your dressing as told by your health care provider. ? Leave stitches (sutures), skin glue, or adhesive strips in place. These skin closures may need to stay in place for 2 weeks or longer. If adhesive strip edges start to loosen and curl up, you may trim the loose edges. Do not remove adhesive strips completely unless your health care provider tells you to do that.  Check your port insertion  site every day for signs of infection. Check for: ? Redness, swelling, or pain. ? Fluid or blood. ? Warmth. ? Pus or a bad smell. Activity  Return to your normal activities as told by your health care provider. Ask your health care provider what activities are safe for you.  Do not lift anything that is heavier than 10 lb (4.5 kg), or the limit that you are told, until your health care provider says that it is safe. General instructions  Take over-the-counter and prescription medicines only as told by your health care provider.  Do not take baths, swim, or use a hot tub until your health care provider approves. Ask your health care provider if you may take showers. You may only be allowed to take sponge baths.  Do not drive for 24 hours if you were given a sedative during your procedure.  Wear a medical alert bracelet in case of an emergency. This will tell any health care providers that you have a port.  Keep all follow-up visits as told by your health care provider. This is important. Contact a health care provider if:  You cannot flush your port with saline as directed, or you cannot draw blood from the port.  You have a fever or chills.  You have redness, swelling, or pain around your port insertion site.  You have fluid or blood coming from your port insertion site.  Your port insertion site feels warm to the touch.  You have pus or a bad smell coming from the port insertion site. Get help right away if:  You have chest pain or shortness of breath.  You have bleeding from your port that you cannot control. Summary  Take care of the port as told by your health care provider. Keep the manufacturer's information card with you at all times.  Change your dressing as told by your health care provider.  Contact a health care provider if you have a fever or chills or if you have redness, swelling, or pain around your port insertion site.  Keep all follow-up visits as told  by your health care provider. This information is not intended to replace advice given to you by your health care provider. Make sure you discuss any questions you have with your health care provider. Document Revised: 11/02/2017 Document Reviewed: 11/02/2017 Elsevier Patient Education  [place].

## 2019-10-01 ENCOUNTER — Encounter: Payer: Self-pay | Admitting: Family Medicine

## 2019-10-06 ENCOUNTER — Other Ambulatory Visit: Payer: Self-pay

## 2019-10-06 ENCOUNTER — Inpatient Hospital Stay: Payer: PPO

## 2019-10-06 VITALS — BP 150/79 | HR 75 | Temp 97.7°F | Resp 18 | Wt 199.5 lb

## 2019-10-06 DIAGNOSIS — C652 Malignant neoplasm of left renal pelvis: Secondary | ICD-10-CM | POA: Diagnosis not present

## 2019-10-06 DIAGNOSIS — C679 Malignant neoplasm of bladder, unspecified: Secondary | ICD-10-CM

## 2019-10-06 DIAGNOSIS — R319 Hematuria, unspecified: Secondary | ICD-10-CM | POA: Diagnosis not present

## 2019-10-06 DIAGNOSIS — Z5111 Encounter for antineoplastic chemotherapy: Secondary | ICD-10-CM | POA: Diagnosis not present

## 2019-10-06 DIAGNOSIS — R5383 Other fatigue: Secondary | ICD-10-CM | POA: Diagnosis not present

## 2019-10-06 DIAGNOSIS — Z95828 Presence of other vascular implants and grafts: Secondary | ICD-10-CM

## 2019-10-06 DIAGNOSIS — Z7982 Long term (current) use of aspirin: Secondary | ICD-10-CM | POA: Diagnosis not present

## 2019-10-06 DIAGNOSIS — Z79899 Other long term (current) drug therapy: Secondary | ICD-10-CM | POA: Diagnosis not present

## 2019-10-06 LAB — CBC WITH DIFFERENTIAL (CANCER CENTER ONLY)
Abs Immature Granulocytes: 0.02 10*3/uL (ref 0.00–0.07)
Basophils Absolute: 0 10*3/uL (ref 0.0–0.1)
Basophils Relative: 0 %
Eosinophils Absolute: 0.1 10*3/uL (ref 0.0–0.5)
Eosinophils Relative: 1 %
HCT: 35.2 % — ABNORMAL LOW (ref 39.0–52.0)
Hemoglobin: 11.7 g/dL — ABNORMAL LOW (ref 13.0–17.0)
Immature Granulocytes: 0 %
Lymphocytes Relative: 15 %
Lymphs Abs: 1.1 10*3/uL (ref 0.7–4.0)
MCH: 27 pg (ref 26.0–34.0)
MCHC: 33.2 g/dL (ref 30.0–36.0)
MCV: 81.3 fL (ref 80.0–100.0)
Monocytes Absolute: 0.7 10*3/uL (ref 0.1–1.0)
Monocytes Relative: 10 %
Neutro Abs: 5.2 10*3/uL (ref 1.7–7.7)
Neutrophils Relative %: 74 %
Platelet Count: 248 10*3/uL (ref 150–400)
RBC: 4.33 MIL/uL (ref 4.22–5.81)
RDW: 14.8 % (ref 11.5–15.5)
WBC Count: 7.1 10*3/uL (ref 4.0–10.5)
nRBC: 0 % (ref 0.0–0.2)

## 2019-10-06 LAB — CMP (CANCER CENTER ONLY)
ALT: 13 U/L (ref 0–44)
AST: 16 U/L (ref 15–41)
Albumin: 3.5 g/dL (ref 3.5–5.0)
Alkaline Phosphatase: 79 U/L (ref 38–126)
Anion gap: 9 (ref 5–15)
BUN: 20 mg/dL (ref 8–23)
CO2: 24 mmol/L (ref 22–32)
Calcium: 9.1 mg/dL (ref 8.9–10.3)
Chloride: 105 mmol/L (ref 98–111)
Creatinine: 0.97 mg/dL (ref 0.61–1.24)
GFR, Est AFR Am: >60 mL/min (ref >60)
GFR, Estimated: >60 mL/min (ref >60)
Glucose, Bld: 103 mg/dL — ABNORMAL HIGH (ref 70–99)
Potassium: 4 mmol/L (ref 3.5–5.1)
Sodium: 138 mmol/L (ref 135–145)
Total Bilirubin: 0.5 mg/dL (ref 0.3–1.2)
Total Protein: 7.3 g/dL (ref 6.5–8.1)

## 2019-10-06 LAB — MAGNESIUM: Magnesium: 2 mg/dL (ref 1.7–2.4)

## 2019-10-06 MED ORDER — SODIUM CHLORIDE 0.9 % IV SOLN
10.0000 mg | Freq: Once | INTRAVENOUS | Status: AC
  Administered 2019-10-06: 10 mg via INTRAVENOUS
  Filled 2019-10-06: qty 10

## 2019-10-06 MED ORDER — SODIUM CHLORIDE 0.9 % IV SOLN
Freq: Once | INTRAVENOUS | Status: AC
  Filled 2019-10-06: qty 250

## 2019-10-06 MED ORDER — PALONOSETRON HCL INJECTION 0.25 MG/5ML
INTRAVENOUS | Status: AC
  Filled 2019-10-06: qty 5

## 2019-10-06 MED ORDER — SODIUM CHLORIDE 0.9 % IV SOLN
1000.0000 mg/m2 | Freq: Once | INTRAVENOUS | Status: AC
  Administered 2019-10-06: 2128 mg via INTRAVENOUS
  Filled 2019-10-06: qty 55.97

## 2019-10-06 MED ORDER — PALONOSETRON HCL INJECTION 0.25 MG/5ML
0.2500 mg | Freq: Once | INTRAVENOUS | Status: AC
  Administered 2019-10-06: 0.25 mg via INTRAVENOUS

## 2019-10-06 MED ORDER — SODIUM CHLORIDE 0.9 % IV SOLN
47.0000 mg/m2 | Freq: Once | INTRAVENOUS | Status: AC
  Administered 2019-10-06: 100 mg via INTRAVENOUS
  Filled 2019-10-06: qty 100

## 2019-10-06 MED ORDER — SODIUM CHLORIDE 0.9% FLUSH
10.0000 mL | INTRAVENOUS | Status: DC | PRN
  Administered 2019-10-06: 10 mL
  Filled 2019-10-06: qty 10

## 2019-10-06 MED ORDER — SODIUM CHLORIDE 0.9% FLUSH
10.0000 mL | INTRAVENOUS | Status: DC | PRN
  Administered 2019-10-06: 10 mL via INTRAVENOUS
  Filled 2019-10-06: qty 10

## 2019-10-06 MED ORDER — SODIUM CHLORIDE 0.9 % IV SOLN
150.0000 mg | Freq: Once | INTRAVENOUS | Status: AC
  Administered 2019-10-06: 150 mg via INTRAVENOUS
  Filled 2019-10-06: qty 150

## 2019-10-06 MED ORDER — HEPARIN SOD (PORK) LOCK FLUSH 100 UNIT/ML IV SOLN
500.0000 [IU] | Freq: Once | INTRAVENOUS | Status: AC | PRN
  Administered 2019-10-06: 500 [IU]
  Filled 2019-10-06: qty 5

## 2019-10-06 MED ORDER — SODIUM CHLORIDE 0.9 % IV SOLN
Freq: Once | INTRAVENOUS | Status: AC
  Filled 2019-10-06: qty 10

## 2019-10-06 NOTE — Patient Instructions (Signed)
[facility] Discharge Instructions for Patients Receiving Chemotherapy  Today you received the following chemotherapy agents: cisplatin and gemcitabine.  To help prevent nausea and vomiting after your treatment, we encourage you to take your nausea medication as directed.   If you develop nausea and vomiting that is not controlled by your nausea medication, call the clinic.   BELOW ARE SYMPTOMS THAT SHOULD BE REPORTED IMMEDIATELY:  *FEVER GREATER THAN 100.5 F  *CHILLS WITH OR WITHOUT FEVER  NAUSEA AND VOMITING THAT IS NOT CONTROLLED WITH YOUR NAUSEA MEDICATION  *UNUSUAL SHORTNESS OF BREATH  *UNUSUAL BRUISING OR BLEEDING  TENDERNESS IN MOUTH AND THROAT WITH OR WITHOUT PRESENCE OF ULCERS  *URINARY PROBLEMS  *BOWEL PROBLEMS  UNUSUAL RASH Items with * indicate a potential emergency and should be followed up as soon as possible.  Feel free to call the clinic should you have any questions or concerns. The clinic phone number is [phone].  Please show the [facility] at check-in to the Emergency Department and triage nurse.  Cisplatin injection What is this medicine? CISPLATIN (SIS pla tin) is a chemotherapy drug. It targets fast dividing cells, like cancer cells, and causes these cells to die. This medicine is used to treat many types of cancer like bladder, ovarian, and testicular cancers. This medicine may be used for other purposes; ask your health care provider or pharmacist if you have questions. COMMON BRAND NAME(S): Platinol, Platinol -AQ What should I tell my health care provider before I take this medicine? They need to know if you have any of these conditions:  eye disease, vision problems  hearing problems  kidney disease  low blood counts, like white cells, platelets, or red blood cells  tingling of the fingers or toes, or other nerve disorder  an unusual or allergic reaction to cisplatin, carboplatin, oxaliplatin, other medicines,  foods, dyes, or preservatives  pregnant or trying to get pregnant  breast-feeding How should I use this medicine? This drug is given as an infusion into a vein. It is administered in a hospital or clinic by a specially trained health care professional. Talk to your pediatrician regarding the use of this medicine in children. Special care may be needed. Overdosage: If you think you have taken too much of this medicine contact a poison control center or emergency room at once. NOTE: This medicine is only for you. Do not share this medicine with others. What if I miss a dose? It is important not to miss a dose. Call your doctor or health care professional if you are unable to keep an appointment. What may interact with this medicine? This medicine may interact with the following medications:  foscarnet  certain antibiotics like amikacin, gentamicin, neomycin, polymyxin B, streptomycin, tobramycin, vancomycin This list may not describe all possible interactions. Give your health care provider a list of all the medicines, herbs, non-prescription drugs, or dietary supplements you use. Also tell them if you smoke, drink alcohol, or use illegal drugs. Some items may interact with your medicine. What should I watch for while using this medicine? Your condition will be monitored carefully while you are receiving this medicine. You will need important blood work done while you are taking this medicine. This drug may make you feel generally unwell. This is not uncommon, as chemotherapy can affect healthy cells as well as cancer cells. Report any side effects. Continue your course of treatment even though you feel ill unless your doctor tells you to stop. This medicine may  increase your risk of getting an infection. Call your healthcare professional for advice if you get a fever, chills, or sore throat, or other symptoms of a cold or flu. Do not treat yourself. Try to avoid being around people who are  sick. Avoid taking medicines that contain aspirin, acetaminophen, ibuprofen, naproxen, or ketoprofen unless instructed by your healthcare professional. These medicines may hide a fever. This medicine may increase your risk to bruise or bleed. Call your doctor or health care professional if you notice any unusual bleeding. Be careful brushing and flossing your teeth or using a toothpick because you may get an infection or bleed more easily. If you have any dental work done, tell your dentist you are receiving this medicine. Do not become pregnant while taking this medicine or for 14 months after stopping it. Women should inform their healthcare professional if they wish to become pregnant or think they might be pregnant. Men should not father a child while taking this medicine and for 11 months after stopping it. There is potential for serious side effects to an unborn child. Talk to your healthcare professional for more information. Do not breast-feed an infant while taking this medicine. This medicine has caused ovarian failure in some women. This medicine may make it more difficult to get pregnant. Talk to your healthcare professional if you are concerned about your fertility. This medicine has caused decreased sperm counts in some men. This may make it more difficult to father a child. Talk to your healthcare professional if you are concerned about your fertility. Drink fluids as directed while you are taking this medicine. This will help protect your kidneys. Call your doctor or health care professional if you get diarrhea. Do not treat yourself. What side effects may I notice from receiving this medicine? Side effects that you should report to your doctor or health care professional as soon as possible:  allergic reactions like skin rash, itching or hives, swelling of the face, lips, or tongue  blurred vision  changes in vision  decreased hearing or ringing of the ears  nausea,  vomiting  pain, redness, or irritation at site where injected  pain, tingling, numbness in the hands or feet  signs and symptoms of bleeding such as bloody or black, tarry stools; red or dark brown urine; spitting up blood or brown material that looks like coffee grounds; red spots on the skin; unusual bruising or bleeding from the eyes, gums, or nose  signs and symptoms of infection like fever; chills; cough; sore throat; pain or trouble passing urine  signs and symptoms of kidney injury like trouble passing urine or change in the amount of urine  signs and symptoms of low red blood cells or anemia such as unusually weak or tired; feeling faint or lightheaded; falls; breathing problems Side effects that usually do not require medical attention (report to your doctor or health care professional if they continue or are bothersome):  loss of appetite  mouth sores  muscle cramps This list may not describe all possible side effects. Call your doctor for medical advice about side effects. You may report side effects to FDA at 1-800-FDA-1088. Where should I keep my medicine? This drug is given in a hospital or clinic and will not be stored at home. NOTE: This sheet is a summary. It may not cover all possible information. If you have questions about this medicine, talk to your doctor, pharmacist, or health care provider.  2020 Elsevier/Gold Standard (2018-04-01 15:59:17)  Gemcitabine  injection What is this medicine? GEMCITABINE (jem SYE ta been) is a chemotherapy drug. This medicine is used to treat many types of cancer like breast cancer, lung cancer, pancreatic cancer, and ovarian cancer. This medicine may be used for other purposes; ask your health care provider or pharmacist if you have questions. COMMON BRAND NAME(S): Gemzar, Infugem What should I tell my health care provider before I take this medicine? They need to know if you have any of these conditions:  blood  disorders  infection  kidney disease  liver disease  lung or breathing disease, like asthma  recent or ongoing radiation therapy  an unusual or allergic reaction to gemcitabine, other chemotherapy, other medicines, foods, dyes, or preservatives  pregnant or trying to get pregnant  breast-feeding How should I use this medicine? This drug is given as an infusion into a vein. It is administered in a hospital or clinic by a specially trained health care professional. Talk to your pediatrician regarding the use of this medicine in children. Special care may be needed. Overdosage: If you think you have taken too much of this medicine contact a poison control center or emergency room at once. NOTE: This medicine is only for you. Do not share this medicine with others. What if I miss a dose? It is important not to miss your dose. Call your doctor or health care professional if you are unable to keep an appointment. What may interact with this medicine?  medicines to increase blood counts like filgrastim, pegfilgrastim, sargramostim  some other chemotherapy drugs like cisplatin  vaccines Talk to your doctor or health care professional before taking any of these medicines:  acetaminophen  aspirin  ibuprofen  ketoprofen  naproxen This list may not describe all possible interactions. Give your health care provider a list of all the medicines, herbs, non-prescription drugs, or dietary supplements you use. Also tell them if you smoke, drink alcohol, or use illegal drugs. Some items may interact with your medicine. What should I watch for while using this medicine? Visit your doctor for checks on your progress. This drug may make you feel generally unwell. This is not uncommon, as chemotherapy can affect healthy cells as well as cancer cells. Report any side effects. Continue your course of treatment even though you feel ill unless your doctor tells you to stop. In some cases, you may  be given additional medicines to help with side effects. Follow all directions for their use. Call your doctor or health care professional for advice if you get a fever, chills or sore throat, or other symptoms of a cold or flu. Do not treat yourself. This drug decreases your body's ability to fight infections. Try to avoid being around people who are sick. This medicine may increase your risk to bruise or bleed. Call your doctor or health care professional if you notice any unusual bleeding. Be careful brushing and flossing your teeth or using a toothpick because you may get an infection or bleed more easily. If you have any dental work done, tell your dentist you are receiving this medicine. Avoid taking products that contain aspirin, acetaminophen, ibuprofen, naproxen, or ketoprofen unless instructed by your doctor. These medicines may hide a fever. Do not become pregnant while taking this medicine or for 6 months after stopping it. Women should inform their doctor if they wish to become pregnant or think they might be pregnant. Men should not father a child while taking this medicine and for 3 months after stopping it.  There is a potential for serious side effects to an unborn child. Talk to your health care professional or pharmacist for more information. Do not breast-feed an infant while taking this medicine or for at least 1 week after stopping it. Men should inform their doctors if they wish to father a child. This medicine may lower sperm counts. Talk with your doctor or health care professional if you are concerned about your fertility. What side effects may I notice from receiving this medicine? Side effects that you should report to your doctor or health care professional as soon as possible:  allergic reactions like skin rash, itching or hives, swelling of the face, lips, or tongue  breathing problems  pain, redness, or irritation at site where injected  signs and symptoms of a  dangerous change in heartbeat or heart rhythm like chest pain; dizziness; fast or irregular heartbeat; palpitations; feeling faint or lightheaded, falls; breathing problems  signs of decreased platelets or bleeding - bruising, pinpoint red spots on the skin, black, tarry stools, blood in the urine  signs of decreased red blood cells - unusually weak or tired, feeling faint or lightheaded, falls  signs of infection - fever or chills, cough, sore throat, pain or difficulty passing urine  signs and symptoms of kidney injury like trouble passing urine or change in the amount of urine  signs and symptoms of liver injury like dark yellow or brown urine; general ill feeling or flu-like symptoms; light-colored stools; loss of appetite; nausea; right upper belly pain; unusually weak or tired; yellowing of the eyes or skin  swelling of ankles, feet, hands Side effects that usually do not require medical attention (report to your doctor or health care professional if they continue or are bothersome):  constipation  diarrhea  hair loss  loss of appetite  nausea  rash  vomiting This list may not describe all possible side effects. Call your doctor for medical advice about side effects. You may report side effects to FDA at 1-800-FDA-1088. Where should I keep my medicine? This drug is given in a hospital or clinic and will not be stored at home. NOTE: This sheet is a summary. It may not cover all possible information. If you have questions about this medicine, talk to your doctor, pharmacist, or health care provider.  2020 Elsevier/Gold Standard (2017-06-30 18:06:11)

## 2019-10-07 ENCOUNTER — Emergency Department (HOSPITAL_COMMUNITY)
Admission: EM | Admit: 2019-10-07 | Discharge: 2019-10-07 | Disposition: A | Discharge status: Home / Self Care | Payer: PPO | Attending: Emergency Medicine | Admitting: Emergency Medicine

## 2019-10-07 ENCOUNTER — Encounter (HOSPITAL_COMMUNITY): Payer: Self-pay | Admitting: *Deleted

## 2019-10-07 ENCOUNTER — Other Ambulatory Visit: Payer: Self-pay

## 2019-10-07 DIAGNOSIS — R6883 Chills (without fever): Secondary | ICD-10-CM | POA: Diagnosis present

## 2019-10-07 DIAGNOSIS — Z5321 Procedure and treatment not carried out due to patient leaving prior to being seen by health care provider: Secondary | ICD-10-CM | POA: Insufficient documentation

## 2019-10-07 DIAGNOSIS — R319 Hematuria, unspecified: Secondary | ICD-10-CM | POA: Insufficient documentation

## 2019-10-07 LAB — CBC WITH DIFFERENTIAL/PLATELET
Abs Immature Granulocytes: 0.06 10*3/uL (ref 0.00–0.07)
Basophils Absolute: 0 10*3/uL (ref 0.0–0.1)
Basophils Relative: 0 %
Eosinophils Absolute: 0 10*3/uL (ref 0.0–0.5)
Eosinophils Relative: 0 %
HCT: 35.3 % — ABNORMAL LOW (ref 39.0–52.0)
Hemoglobin: 11.9 g/dL — ABNORMAL LOW (ref 13.0–17.0)
Immature Granulocytes: 1 %
Lymphocytes Relative: 6 %
Lymphs Abs: 0.8 10*3/uL (ref 0.7–4.0)
MCH: 27.8 pg (ref 26.0–34.0)
MCHC: 33.7 g/dL (ref 30.0–36.0)
MCV: 82.5 fL (ref 80.0–100.0)
Monocytes Absolute: 0.8 10*3/uL (ref 0.1–1.0)
Monocytes Relative: 6 %
Neutro Abs: 11.7 10*3/uL — ABNORMAL HIGH (ref 1.7–7.7)
Neutrophils Relative %: 87 %
Platelets: 255 10*3/uL (ref 150–400)
RBC: 4.28 MIL/uL (ref 4.22–5.81)
RDW: 14.8 % (ref 11.5–15.5)
WBC: 13.3 10*3/uL — ABNORMAL HIGH (ref 4.0–10.5)
nRBC: 0 % (ref 0.0–0.2)

## 2019-10-07 LAB — COMPREHENSIVE METABOLIC PANEL
ALT: 18 U/L (ref 0–44)
AST: 20 U/L (ref 15–41)
Albumin: 3.7 g/dL (ref 3.5–5.0)
Alkaline Phosphatase: 62 U/L (ref 38–126)
Anion gap: 13 (ref 5–15)
BUN: 34 mg/dL — ABNORMAL HIGH (ref 8–23)
CO2: 20 mmol/L — ABNORMAL LOW (ref 22–32)
Calcium: 8.8 mg/dL — ABNORMAL LOW (ref 8.9–10.3)
Chloride: 104 mmol/L (ref 98–111)
Creatinine, Ser: 1.31 mg/dL — ABNORMAL HIGH (ref 0.61–1.24)
GFR calc Af Amer: >60 mL/min (ref >60)
GFR calc non Af Amer: 53 mL/min — ABNORMAL LOW (ref >60)
Glucose, Bld: 111 mg/dL — ABNORMAL HIGH (ref 70–99)
Potassium: 4.5 mmol/L (ref 3.5–5.1)
Sodium: 137 mmol/L (ref 135–145)
Total Bilirubin: 1.2 mg/dL (ref 0.3–1.2)
Total Protein: 7.1 g/dL (ref 6.5–8.1)

## 2019-10-07 LAB — LACTIC ACID, PLASMA: Lactic Acid, Venous: 1 mmol/L (ref 0.5–1.9)

## 2019-10-07 NOTE — ED Triage Notes (Signed)
Pt had first chemo yesterday, today hypothermic, chills, hematuria and pain. Sent by Cancer on call person

## 2019-10-09 ENCOUNTER — Telehealth: Payer: Self-pay | Admitting: *Deleted

## 2019-10-09 ENCOUNTER — Encounter: Payer: Self-pay | Admitting: Family Medicine

## 2019-10-09 ENCOUNTER — Other Ambulatory Visit: Payer: Self-pay

## 2019-10-09 ENCOUNTER — Emergency Department (HOSPITAL_COMMUNITY)
Admission: EM | Admit: 2019-10-09 | Discharge: 2019-10-09 | Disposition: A | Discharge status: Home / Self Care | Payer: PPO | Attending: Emergency Medicine | Admitting: Emergency Medicine

## 2019-10-09 ENCOUNTER — Encounter (HOSPITAL_COMMUNITY): Payer: Self-pay

## 2019-10-09 DIAGNOSIS — Z85828 Personal history of other malignant neoplasm of skin: Secondary | ICD-10-CM | POA: Diagnosis not present

## 2019-10-09 DIAGNOSIS — Z7982 Long term (current) use of aspirin: Secondary | ICD-10-CM | POA: Insufficient documentation

## 2019-10-09 DIAGNOSIS — N020 Recurrent and persistent hematuria with minor glomerular abnormality: Secondary | ICD-10-CM | POA: Diagnosis not present

## 2019-10-09 DIAGNOSIS — I10 Essential (primary) hypertension: Secondary | ICD-10-CM | POA: Insufficient documentation

## 2019-10-09 DIAGNOSIS — I2510 Atherosclerotic heart disease of native coronary artery without angina pectoris: Secondary | ICD-10-CM | POA: Insufficient documentation

## 2019-10-09 DIAGNOSIS — J45909 Unspecified asthma, uncomplicated: Secondary | ICD-10-CM | POA: Insufficient documentation

## 2019-10-09 DIAGNOSIS — Z79899 Other long term (current) drug therapy: Secondary | ICD-10-CM | POA: Diagnosis not present

## 2019-10-09 DIAGNOSIS — R319 Hematuria, unspecified: Secondary | ICD-10-CM | POA: Diagnosis present

## 2019-10-09 LAB — COMPREHENSIVE METABOLIC PANEL
ALT: 14 U/L (ref 0–44)
AST: 18 U/L (ref 15–41)
Albumin: 3.6 g/dL (ref 3.5–5.0)
Alkaline Phosphatase: 56 U/L (ref 38–126)
Anion gap: 12 (ref 5–15)
BUN: 28 mg/dL — ABNORMAL HIGH (ref 8–23)
CO2: 23 mmol/L (ref 22–32)
Calcium: 8.4 mg/dL — ABNORMAL LOW (ref 8.9–10.3)
Chloride: 104 mmol/L (ref 98–111)
Creatinine, Ser: 1.16 mg/dL (ref 0.61–1.24)
GFR calc Af Amer: >60 mL/min (ref >60)
GFR calc non Af Amer: >60 mL/min (ref >60)
Glucose, Bld: 105 mg/dL — ABNORMAL HIGH (ref 70–99)
Potassium: 4.2 mmol/L (ref 3.5–5.1)
Sodium: 139 mmol/L (ref 135–145)
Total Bilirubin: 0.8 mg/dL (ref 0.3–1.2)
Total Protein: 7.1 g/dL (ref 6.5–8.1)

## 2019-10-09 LAB — URINALYSIS, ROUTINE W REFLEX MICROSCOPIC: RBC / HPF: >50 RBC/hpf — ABNORMAL HIGH (ref 0–5)

## 2019-10-09 LAB — CBC WITH DIFFERENTIAL/PLATELET
Abs Immature Granulocytes: 0.02 10*3/uL (ref 0.00–0.07)
Basophils Absolute: 0 10*3/uL (ref 0.0–0.1)
Basophils Relative: 0 %
Eosinophils Absolute: 0.1 10*3/uL (ref 0.0–0.5)
Eosinophils Relative: 1 %
HCT: 34.2 % — ABNORMAL LOW (ref 39.0–52.0)
Hemoglobin: 11.4 g/dL — ABNORMAL LOW (ref 13.0–17.0)
Immature Granulocytes: 0 %
Lymphocytes Relative: 11 %
Lymphs Abs: 0.9 10*3/uL (ref 0.7–4.0)
MCH: 27.7 pg (ref 26.0–34.0)
MCHC: 33.3 g/dL (ref 30.0–36.0)
MCV: 83.2 fL (ref 80.0–100.0)
Monocytes Absolute: 0.2 10*3/uL (ref 0.1–1.0)
Monocytes Relative: 3 %
Neutro Abs: 7 10*3/uL (ref 1.7–7.7)
Neutrophils Relative %: 85 %
Platelets: 211 10*3/uL (ref 150–400)
RBC: 4.11 MIL/uL — ABNORMAL LOW (ref 4.22–5.81)
RDW: 14.6 % (ref 11.5–15.5)
WBC: 8.2 10*3/uL (ref 4.0–10.5)
nRBC: 0 % (ref 0.0–0.2)

## 2019-10-09 MED ORDER — HEPARIN SOD (PORK) LOCK FLUSH 100 UNIT/ML IV SOLN
500.0000 [IU] | Freq: Once | INTRAVENOUS | Status: AC
  Administered 2019-10-09: 500 [IU]
  Filled 2019-10-09: qty 5

## 2019-10-09 MED ORDER — SODIUM CHLORIDE 0.9 % IV BOLUS
1000.0000 mL | Freq: Once | INTRAVENOUS | Status: AC
  Administered 2019-10-09: 1000 mL via INTRAVENOUS

## 2019-10-09 NOTE — ED Notes (Signed)
Pharmacy at pt bedside

## 2019-10-09 NOTE — ED Provider Notes (Signed)
[facility] DEPT Provider Note   CSN: [id number] Arrival date & time: 10/09/19  1707     History Chief Complaint  Patient presents with  . chemo patient  . Hematuria    Lawrence Hall is a 74 y.o. male.  Patient had chemotherapy Friday for a renal pelvis tumor.  And has been having bleeding ever since Saturday.  Patient said he passing clots.  The history is provided by the patient. No language interpreter was used.  Hematuria This is a new problem. The current episode started more than 2 days ago. The problem occurs constantly. The problem has not changed since onset.Pertinent negatives include no chest pain, no abdominal pain and no headaches. Nothing aggravates the symptoms. Nothing relieves the symptoms. He has tried nothing for the symptoms. The treatment provided no relief.       Past Medical History:  Diagnosis Date  . Arthritis    mild hands  . Asthma    pt denies 4/21   . Chest pain 07/25/2012  . Coronary artery disease   . Dysrhythmia    PAC'S, hx of atrial fib after OHS 04/2018  . ED (erectile dysfunction)   . H/O hiatal hernia   . Heart murmur   . Hypercholesterolemia   . Hypertension    MARGINALLY HIGH - NO MEDS -MONITORS  . Myocardial infarction ([place])    2017   . Pneumonia    hx of 2018   . PONV (postoperative nausea and vomiting)   . Prostate cancer ([place]) 03/27/2011   prostate bx=adenocarcinoma,  . Skin cancer 2008   basal cell face /removed  . Sleep apnea    borderline sleep apnea per patient no devices     Patient Active Problem List   Diagnosis Date Noted  . Right rotator cuff tear 09/19/2019  . Malignant tumor of renal pelvis ([place]) 09/12/2019  . History of aortic valve replacement with bioprosthetic valve 05/18/2018  . Postoperative atrial fibrillation ([place]) 05/18/2018  . LBBB (left bundle branch block) 05/18/2018  . S/P CABG x 3 04/29/2018  . CAD (coronary artery disease), native coronary artery 04/26/2018  .  Near syncope   . Nonrheumatic aortic valve stenosis   . Non-STEMI (non-ST elevated myocardial infarction) ([place])   . Essential hypertension 06/01/2015  . Asthma 05/31/2015  . Elevated troponin 05/31/2015  . Arthritis   . Hyperlipidemia   . Skin cancer   . ED (erectile dysfunction)   . Prostate cancer ([place]) 03/27/2011    Past Surgical History:  Procedure Laterality Date  . AORTIC VALVE REPLACEMENT N/A 04/29/2018   Procedure: AORTIC VALVE REPLACEMENT (AVR) using INSPIRIS RESILIA  AORTIC VALVE 45mm;  Surgeon: Ivin Poot, MD;  Location: [facility];  Service: Open Heart Surgery;  Laterality: N/A;  . basal cell cancer  2008   inside nose  . CARDIAC CATHETERIZATION N/A 06/03/2015   Procedure: Left Heart Cath and Coronary Angiography;  Surgeon: [name] Sine, MD;  Location: [facility];  Service: Cardiovascular;  Laterality: N/A;  . CATARACT EXTRACTION, BILATERAL  2007  . CORONARY ARTERY BYPASS GRAFT N/A 04/29/2018   Procedure: CORONARY ARTERY BYPASS GRAFTING (CABG) X 3; Using Left Internal Mammary Artery (LIMA) and Right Leg Greater Saphenous Vein Graft (SVG);  LIMA to LAD, SVG to OM1, SVG to RCA,;  Surgeon: Ivin Poot, MD;  Location: [facility];  Service: Open Heart Surgery;  Laterality: N/A;  . CYSTOSCOPY/RETROGRADE/URETEROSCOPY Left 08/21/2019   Procedure: CYSTOSCOPY/RETROGRADE/URETEROSCOPY/  LEFT BIOPSY/BRUSH BIOPSY/RENAL  WASHINGS/ AND STENT PLACEMENT;  Surgeon: Raynelle Bring, MD;  Location: [facility];  Service: Urology;  Laterality: Left;  . IR IMAGING GUIDED PORT INSERTION  09/28/2019  . RIGHT/LEFT HEART CATH AND CORONARY ANGIOGRAPHY N/A 04/26/2018   Procedure: RIGHT/LEFT HEART CATH AND CORONARY ANGIOGRAPHY;  Surgeon: [name] Sine, MD;  Location: [facility];  Service: Cardiovascular;  Laterality: N/A;  . ROBOT ASSISTED LAPAROSCOPIC RADICAL PROSTATECTOMY  08/10/2011   Procedure: ROBOTIC ASSISTED LAPAROSCOPIC RADICAL PROSTATECTOMY LEVEL 2;  Surgeon: Dutch Gray, MD;  Location: [facility];  Service: Urology;  Laterality: N/A;  . TEE WITHOUT CARDIOVERSION N/A 04/29/2018   Procedure: TRANSESOPHAGEAL ECHOCARDIOGRAM (TEE);  Surgeon: Prescott Gum, Collier Salina, MD;  Location: [facility];  Service: Open Heart Surgery;  Laterality: N/A;  . THYROID SURGERY  infant   uncertain details; no thyroid replacement listed       Family History  Problem Relation Age of Onset  . Prostate cancer Father 34       in his 70's/other comorbidities/79 deceased  . Alcohol abuse Father   . Liver disease Father   . Heart attack Father 57  . Breast cancer Mother 50       mets to bone,deceased age 52  . Cervical cancer Sister   . Cervical cancer Sister   . Breast cancer Sister   . CAD Brother        Died of sudden cardiac death/MI    Social History   Tobacco Use  . Smoking status: Never Smoker  . Smokeless tobacco: Never Used  Vaping Use  . Vaping Use: Never used  Substance Use Topics  . Alcohol use: Not Currently  . Drug use: No    Home Medications Prior to Admission medications   Medication Sig Start Date End Date Taking? Authorizing Provider  aspirin EC 81 MG tablet Take 81 mg by mouth daily.   Yes [provider]  atorvastatin (LIPITOR) 80 MG tablet TAKE 1 TABLET ONCE DAILY AT 6 PM. Patient taking differently: Take 80 mg by mouth daily at 6 PM.  06/30/19  Yes [name], Mihai, MD  chlorhexidine (PERIDEX) 0.12 % solution Rinse with 15 mls twice daily for 30 seconds. Use after breakfast and at bedtime. Spit out excess. Do not swallow. Patient taking differently: Use as directed 15 mLs in the mouth or throat daily.  05/09/18  Yes Lenn Cal, DDS  Cholecalciferol (VITAMIN D-3) 125 MCG (5000 UT) TABS Take 5,000 Units by mouth daily.   Yes [provider]  lidocaine-prilocaine (EMLA) cream Apply 1 application topically as needed. Patient taking differently: Apply 1 application topically daily as needed (pain).  09/21/19  Yes Wyatt Portela, MD  metoprolol succinate  (TOPROL-XL) 100 MG 24 hr tablet TAKE 1 TABLET AT BEDTIME. TAKE WITH OR IMMEDIATELY FOLLOWING A MEAL. Patient taking differently: Take 100 mg by mouth daily.  09/26/19  Yes [name], Mihai, MD  nitroGLYCERIN (NITROSTAT) 0.4 MG SL tablet 1 TAB UNDER TONGUE AS NEEDED FOR CHEST PAIN. MAY REPEAT EVERY 5 MIN FOR A TOTAL OF 3 DOSES. Patient taking differently: Place 0.4 mg under the tongue every 5 (five) minutes as needed for chest pain.  06/21/19  Yes [name], Mihai, MD  prochlorperazine (COMPAZINE) 10 MG tablet Take 10 mg by mouth every 6 (six) hours as needed for nausea or vomiting.   Yes [provider]  traZODone (DESYREL) 50 MG tablet 1/2-1 tablet at bedtime Patient taking differently: Take 50 mg by mouth at bedtime as needed for  sleep.  09/19/19  Yes Lyndal Pulley, DO  vitamin E 400 UNIT capsule Take 400 Units by mouth every evening.    Yes [provider]  amLODipine (NORVASC) 2.5 MG tablet Take 1 tablet (2.5 mg total) by mouth daily. Patient not taking: Reported on 10/09/2019 09/25/19 12/24/19  Abigail Butts., PA-C    Allergies    Other  Review of Systems   Review of Systems  Constitutional: Negative for appetite change and fatigue.  HENT: Negative for congestion, ear discharge and sinus pressure.   Eyes: Negative for discharge.  Respiratory: Negative for cough.   Cardiovascular: Negative for chest pain.  Gastrointestinal: Negative for abdominal pain and diarrhea.  Genitourinary: Positive for hematuria. Negative for frequency.  Musculoskeletal: Negative for back pain.  Skin: Negative for rash.  Neurological: Negative for seizures and headaches.  Psychiatric/Behavioral: Negative for hallucinations.    Physical Exam Updated Vital Signs BP (!) 154/82   Pulse 82   Temp 98 F (36.7 C) (Oral)   Resp (!) 24   Ht 6' (1.829 m)   Wt 88.7 kg   SpO2 96%   BMI 26.51 kg/m   Physical Exam Vitals and nursing note reviewed.  Constitutional:      Appearance: He is  well-developed.  HENT:     Head: Normocephalic.     Nose: Nose normal.  Eyes:     General: No scleral icterus.    Conjunctiva/sclera: Conjunctivae normal.  Neck:     Thyroid: No thyromegaly.  Cardiovascular:     Rate and Rhythm: Normal rate and regular rhythm.     Heart sounds: No murmur heard.  No friction rub. No gallop.   Pulmonary:     Breath sounds: No stridor. No wheezing or rales.  Chest:     Chest wall: No tenderness.  Abdominal:     General: There is no distension.     Tenderness: There is no abdominal tenderness. There is no rebound. Left CVA tenderness:    Musculoskeletal:        General: Normal range of motion.     Cervical back: Neck supple.  Lymphadenopathy:     Cervical: No cervical adenopathy.  Skin:    Findings: No erythema or rash.  Neurological:     Mental Status: He is alert and oriented to person, place, and time.     Motor: No abnormal muscle tone.     Coordination: Coordination normal.  Psychiatric:        Behavior: Behavior normal.     ED Results / Procedures / Treatments   Labs (all labs ordered are listed, but only abnormal results are displayed) Labs Reviewed  URINALYSIS, ROUTINE W REFLEX MICROSCOPIC - Abnormal; Notable for the following components:      Result Value   Color, Urine RED (*)    APPearance TURBID (*)    Glucose, UA   (*)    Value: TEST NOT REPORTED DUE TO COLOR INTERFERENCE OF URINE PIGMENT   Hgb urine dipstick   (*)    Value: TEST NOT REPORTED DUE TO COLOR INTERFERENCE OF URINE PIGMENT   Bilirubin Urine   (*)    Value: TEST NOT REPORTED DUE TO COLOR INTERFERENCE OF URINE PIGMENT   Ketones, ur   (*)    Value: TEST NOT REPORTED DUE TO COLOR INTERFERENCE OF URINE PIGMENT   Protein, ur   (*)    Value: TEST NOT REPORTED DUE TO COLOR INTERFERENCE OF URINE PIGMENT   Nitrite   (*)  Value: TEST NOT REPORTED DUE TO COLOR INTERFERENCE OF URINE PIGMENT   Leukocytes,Ua   (*)    Value: TEST NOT REPORTED DUE TO COLOR INTERFERENCE  OF URINE PIGMENT   RBC / HPF >50 (*)    Bacteria, UA RARE (*)    All other components within normal limits  CBC WITH DIFFERENTIAL/PLATELET - Abnormal; Notable for the following components:   RBC 4.11 (*)    Hemoglobin 11.4 (*)    HCT 34.2 (*)    All other components within normal limits  COMPREHENSIVE METABOLIC PANEL - Abnormal; Notable for the following components:   Glucose, Bld 105 (*)    BUN 28 (*)    Calcium 8.4 (*)    All other components within normal limits  URINE CULTURE    EKG None  Radiology No results found.  Procedures Procedures (including critical care time)  Medications Ordered in ED Medications  sodium chloride 0.9 % bolus 1,000 mL (1,000 mLs Intravenous New Bag/Given 10/09/19 1918)    ED Course  I have reviewed the triage vital signs and the nursing notes.  Pertinent labs & imaging results that were available during my care of the patient were reviewed by me and considered in my medical decision making (see chart for details).    MDM Rules/Calculators/A&P                          Patient with hematuria and half gram drop in his hemoglobin.  I spoke with oncology and they recommended speaking to urology.  I told the patient we should put a Foley in and speak with urology but he refused the Foley and wants to follow-up with urology this week        This patient presents to the ED for concern of hematuria this involves an extensive number of treatment options, and is a complaint that carries with it a high risk of complications and morbidity.  The differential diagnosis includes UTI cancer.   Lab Tests:  I Ordered, reviewed, and interpreted labs, which included CBC chemistries and UA.  Hemoglobin shows mild anemia urine shows blood Medicines ordered:     Imaging Studies ordered:   Additional history obtained:   Additional history obtained from relative  Previous records obtained and reviewed.  Consultations Obtained:   I consulted  oncology and discussed lab and imaging findings  Reevaluation:  After the interventions stated above, I reevaluated the patient and found no improvement  Critical Interventions:  .   Final Clinical Impression(s) / ED Diagnoses Final diagnoses:  Idiopathic hematuria with minor glomerular abnormality    Rx / DC Orders ED Discharge Orders    None       Milton Ferguson, MD 10/09/19 2153

## 2019-10-09 NOTE — Discharge Instructions (Addendum)
Call your urologist tomorrow for follow-up this week

## 2019-10-09 NOTE — ED Triage Notes (Signed)
Patient states he came to the ED 2 days ago with c/o hematuria with clots. Patient states he left due to the wait. Patient states he is having an  increased amount of clots when he urinates. Patient states he received a chemo treatment 3 days ago for urinary tract cancer.

## 2019-10-09 NOTE — ED Notes (Signed)
Urinal at bedside.  

## 2019-10-09 NOTE — Telephone Encounter (Signed)
Received Telephone Advice fax from after hours [facility].  Patient contacted them 2 x on 6/19 with following info:  Patient stated he received chemo on 6/18 and was concerned that temp was low (94.9) and that he passed a small blood clot about an inch in size. Was having moderate to severe pain between ribs and bladder.  Was advised by them to seek care from PCP within next 24 hours or go to ED if needed.  Patient went to ED on 6/19 and per his chart, contacted PCP via [facility] on 6/21. Dr. Alen Blew informed.

## 2019-10-09 NOTE — Telephone Encounter (Signed)
Patient presented to [place] lobby at 4:15PM asking to be seen, stating had some blood in urine over weekend.  Scheduling contacted this RN, information given to PA in [facility]. Mr. Satira Sark, [place] advised on patient request - he is not able to see patient today at this time - stated patient can be scheduled for [facility] in AM. Also recommended patient go to ED if needed if symptoms worsen overnight. Scheduling contacted this RN to speak with patient. While scheduling appt he informed scheduler he is passing blood/bloodclots in urine and states he is unable to eat. Spoke with patient. Patient states he does not feel well, cannot eat and is passing blood/blood clots. Informed Mr. Tanner of patient symptoms and he advised that patient go to ED now.  Encouraged/advised patient to go to ED now for evaluation. He states he was there on Saturday night and left after temp checked and blood drawn. He states he did not speak with a provider because it was too busy and he does not want to go back. He just wants to have an appointment in the morning here. Advised him that it is recommended he go to ED now for evaluation. He wanted to know if Dr. Alen Blew would be informed of this and was assured that he will. Patient stated he would go to ED

## 2019-10-10 ENCOUNTER — Other Ambulatory Visit: Payer: Self-pay

## 2019-10-10 ENCOUNTER — Telehealth: Payer: Self-pay | Admitting: *Deleted

## 2019-10-10 DIAGNOSIS — R310 Gross hematuria: Secondary | ICD-10-CM | POA: Diagnosis not present

## 2019-10-10 DIAGNOSIS — M79601 Pain in right arm: Secondary | ICD-10-CM

## 2019-10-10 DIAGNOSIS — D49512 Neoplasm of unspecified behavior of left kidney: Secondary | ICD-10-CM | POA: Diagnosis not present

## 2019-10-10 NOTE — Telephone Encounter (Signed)
Called pt to see how he did with his recent treatment & he reports going to ED twice due to blood/clots in his urine.  He went home after 5 hours due to violence in ED.  Went back on  Monday hs & received IVF.  He went to his urologist today & bladder operating well but suggested increasing po fluids.  We had telephone connection problems & tried to reach pt to discuss more  Reached pt later & He states that bleeding/clots is much better.  He will see Dr Alen Blew on Friday & will discuss.  He is drinking more fluids.  Message sent to Dr Alen Blew.

## 2019-10-11 ENCOUNTER — Ambulatory Visit (HOSPITAL_COMMUNITY): Payer: PPO

## 2019-10-11 LAB — URINE CULTURE: Culture: <10000 — AB

## 2019-10-12 ENCOUNTER — Telehealth: Payer: Self-pay | Admitting: *Deleted

## 2019-10-13 ENCOUNTER — Other Ambulatory Visit: Payer: Self-pay

## 2019-10-13 ENCOUNTER — Inpatient Hospital Stay: Payer: PPO

## 2019-10-13 ENCOUNTER — Inpatient Hospital Stay: Payer: PPO | Admitting: Oncology

## 2019-10-13 VITALS — BP 155/85 | HR 83 | Temp 97.6°F | Resp 18 | Ht 72.0 in | Wt 189.3 lb

## 2019-10-13 DIAGNOSIS — C679 Malignant neoplasm of bladder, unspecified: Secondary | ICD-10-CM | POA: Diagnosis not present

## 2019-10-13 DIAGNOSIS — Z5111 Encounter for antineoplastic chemotherapy: Secondary | ICD-10-CM | POA: Diagnosis not present

## 2019-10-13 DIAGNOSIS — Z95828 Presence of other vascular implants and grafts: Secondary | ICD-10-CM

## 2019-10-13 DIAGNOSIS — C652 Malignant neoplasm of left renal pelvis: Secondary | ICD-10-CM

## 2019-10-13 LAB — CBC WITH DIFFERENTIAL (CANCER CENTER ONLY)
Abs Immature Granulocytes: 0.02 10*3/uL (ref 0.00–0.07)
Basophils Absolute: 0 10*3/uL (ref 0.0–0.1)
Basophils Relative: 0 %
Eosinophils Absolute: 0 10*3/uL (ref 0.0–0.5)
Eosinophils Relative: 0 %
HCT: 32.4 % — ABNORMAL LOW (ref 39.0–52.0)
Hemoglobin: 11.1 g/dL — ABNORMAL LOW (ref 13.0–17.0)
Immature Granulocytes: 0 %
Lymphocytes Relative: 17 %
Lymphs Abs: 0.9 10*3/uL (ref 0.7–4.0)
MCH: 27.3 pg (ref 26.0–34.0)
MCHC: 34.3 g/dL (ref 30.0–36.0)
MCV: 79.8 fL — ABNORMAL LOW (ref 80.0–100.0)
Monocytes Absolute: 0.4 10*3/uL (ref 0.1–1.0)
Monocytes Relative: 8 %
Neutro Abs: 4 10*3/uL (ref 1.7–7.7)
Neutrophils Relative %: 75 %
Platelet Count: 143 10*3/uL — ABNORMAL LOW (ref 150–400)
RBC: 4.06 MIL/uL — ABNORMAL LOW (ref 4.22–5.81)
RDW: 14 % (ref 11.5–15.5)
WBC Count: 5.4 10*3/uL (ref 4.0–10.5)
nRBC: 0 % (ref 0.0–0.2)

## 2019-10-13 LAB — CMP (CANCER CENTER ONLY)
ALT: 13 U/L (ref 0–44)
AST: 16 U/L (ref 15–41)
Albumin: 3.3 g/dL — ABNORMAL LOW (ref 3.5–5.0)
Alkaline Phosphatase: 69 U/L (ref 38–126)
Anion gap: 11 (ref 5–15)
BUN: 28 mg/dL — ABNORMAL HIGH (ref 8–23)
CO2: 23 mmol/L (ref 22–32)
Calcium: 9.2 mg/dL (ref 8.9–10.3)
Chloride: 103 mmol/L (ref 98–111)
Creatinine: 1.28 mg/dL — ABNORMAL HIGH (ref 0.61–1.24)
GFR, Est AFR Am: >60 mL/min (ref >60)
GFR, Estimated: 54 mL/min — ABNORMAL LOW (ref >60)
Glucose, Bld: 104 mg/dL — ABNORMAL HIGH (ref 70–99)
Potassium: 3.9 mmol/L (ref 3.5–5.1)
Sodium: 137 mmol/L (ref 135–145)
Total Bilirubin: 0.5 mg/dL (ref 0.3–1.2)
Total Protein: 7.5 g/dL (ref 6.5–8.1)

## 2019-10-13 MED ORDER — PROCHLORPERAZINE MALEATE 10 MG PO TABS
10.0000 mg | ORAL_TABLET | Freq: Once | ORAL | Status: AC
  Administered 2019-10-13: 10 mg via ORAL

## 2019-10-13 MED ORDER — HEPARIN SOD (PORK) LOCK FLUSH 100 UNIT/ML IV SOLN
500.0000 [IU] | Freq: Once | INTRAVENOUS | Status: AC | PRN
  Administered 2019-10-13: 500 [IU]
  Filled 2019-10-13: qty 5

## 2019-10-13 MED ORDER — PROCHLORPERAZINE MALEATE 10 MG PO TABS
ORAL_TABLET | ORAL | Status: AC
  Filled 2019-10-13: qty 1

## 2019-10-13 MED ORDER — SODIUM CHLORIDE 0.9% FLUSH
10.0000 mL | INTRAVENOUS | Status: DC | PRN
  Administered 2019-10-13: 10 mL
  Filled 2019-10-13: qty 10

## 2019-10-13 MED ORDER — SODIUM CHLORIDE 0.9 % IV SOLN
Freq: Once | INTRAVENOUS | Status: AC
  Filled 2019-10-13: qty 250

## 2019-10-13 MED ORDER — SODIUM CHLORIDE 0.9 % IV SOLN
1000.0000 mg/m2 | Freq: Once | INTRAVENOUS | Status: AC
  Administered 2019-10-13: 2128 mg via INTRAVENOUS
  Filled 2019-10-13: qty 55.97

## 2019-10-13 NOTE — Patient Instructions (Signed)

## 2019-10-13 NOTE — Patient Instructions (Signed)
[facility] Discharge Instructions for Patients Receiving Chemotherapy  Today you received the following chemotherapy agents Gemcitabine (GEMZAR).  To help prevent nausea and vomiting after your treatment, we encourage you to take your nausea medication as prescribed.   If you develop nausea and vomiting that is not controlled by your nausea medication, call the clinic.   BELOW ARE SYMPTOMS THAT SHOULD BE REPORTED IMMEDIATELY:  *FEVER GREATER THAN 100.5 F  *CHILLS WITH OR WITHOUT FEVER  NAUSEA AND VOMITING THAT IS NOT CONTROLLED WITH YOUR NAUSEA MEDICATION  *UNUSUAL SHORTNESS OF BREATH  *UNUSUAL BRUISING OR BLEEDING  TENDERNESS IN MOUTH AND THROAT WITH OR WITHOUT PRESENCE OF ULCERS  *URINARY PROBLEMS  *BOWEL PROBLEMS  UNUSUAL RASH Items with * indicate a potential emergency and should be followed up as soon as possible.  Feel free to call the clinic should you have any questions or concerns. The clinic phone number is [phone].  Please show the CHEMO ALERT CARD at check-in to the Emergency Department and triage nurse.   

## 2019-10-13 NOTE — Progress Notes (Signed)
Hematology and Oncology Follow Up Visit  Lawrence Hall [id number] 09-Nov-1945 74 y.o. 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   Prior Therapy: He is status post biopsy obtained on Aug 21, 2019 which showed high-grade urothelial carcinoma of the left renal pelvis.   Current therapy:  Chemotherapy utilizing gemcitabine and cisplatin given on day 1 and cisplatin given on day 8 start her on 10/06/2019.  Is here for day 8 of cycle 1 of therapy.  Interim History: Lawrence Hall returns today for a follow-up visit.  Since the last visit, he received the first cycle of chemotherapy and developed hematuria.  He was seen in the emergency department and follow-up with urology regarding this.  His bleeding has improved at this time and urinating freely.  He denies any nausea, vomiting or abdominal pain.  He did report some mild fatigue associated with the last cycle of chemotherapy.     Medications: I have reviewed the patient's current medications.  Current Outpatient Medications  Medication Sig Dispense Refill  . amLODipine (NORVASC) 2.5 MG tablet Take 1 tablet (2.5 mg total) by mouth daily. (Patient not taking: Reported on 10/09/2019) 30 tablet 6  . aspirin EC 81 MG tablet Take 81 mg by mouth daily.    [name] atorvastatin (LIPITOR) 80 MG tablet TAKE 1 TABLET ONCE DAILY AT 6 PM. (Patient taking differently: Take 80 mg by mouth daily at 6 PM. ) 90 tablet 2  . chlorhexidine (PERIDEX) 0.12 % solution Rinse with 15 mls twice daily for 30 seconds. Use after breakfast and at bedtime. Spit out excess. Do not swallow. (Patient taking differently: Use as directed 15 mLs in the mouth or throat daily. ) 960 mL 5  . Cholecalciferol (VITAMIN D-3) 125 MCG (5000 UT) TABS Take 5,000 Units by mouth daily.    [name]  lidocaine-prilocaine (EMLA) cream Apply 1 application topically as needed. (Patient taking differently: Apply 1 application topically daily as needed (pain). ) 30 g 0  . metoprolol succinate (TOPROL-XL) 100 MG 24 hr tablet TAKE 1 TABLET AT BEDTIME. TAKE WITH OR IMMEDIATELY FOLLOWING A MEAL. (Patient taking differently: Take 100 mg by mouth daily. ) 90 tablet 0  . nitroGLYCERIN (NITROSTAT) 0.4 MG SL tablet 1 TAB UNDER TONGUE AS NEEDED FOR CHEST PAIN. MAY REPEAT EVERY 5 MIN FOR A TOTAL OF 3 DOSES. (Patient taking differently: Place 0.4 mg under the tongue every 5 (five) minutes as needed for chest pain. ) 25 tablet 4  . prochlorperazine (COMPAZINE) 10 MG tablet Take 10 mg by mouth every 6 (six) hours as needed for nausea or vomiting.    . traZODone (DESYREL) 50 MG tablet 1/2-1 tablet at bedtime (Patient taking differently: Take 50 mg by mouth at bedtime as needed for sleep. ) 30 tablet 0  . vitamin E 400 UNIT capsule Take 400 Units by mouth every evening.      No current facility-administered medications for this visit.   Facility-Administered Medications Ordered in Other Visits  Medication Dose Route Frequency Provider Last Rate Last Admin  . sodium chloride flush (NS) 0.9 % injection 10 mL  10 mL Intracatheter PRN Wyatt Portela, MD   10 mL at 10/13/19 1059     Allergies:  Allergies  Allergen Reactions  . Other Itching and Other (See Comments)    Pet dander, seasonal  allergies = Runny nose, itchy eyes      Physical Exam: Blood pressure (!) 155/85, pulse 83, temperature 97.6 F (36.4 C), temperature source Temporal, resp. rate 18, height 6' (1.829 m), weight 189 lb 4.8 oz (85.9 kg), SpO2 98 %.   ECOG: 1    General appearance: Comfortable appearing without any discomfort Head: Normocephalic without any trauma Oropharynx: Mucous membranes are moist and pink without any thrush or ulcers. Eyes: Pupils are equal and round reactive to light. Lymph nodes: No cervical, supraclavicular,  inguinal or axillary lymphadenopathy.   Heart:regular rate and rhythm.  S1 and S2 without leg edema. Lung: Clear without any rhonchi or wheezes.  No dullness to percussion. Abdomin: Soft, nontender, nondistended with good bowel sounds.  No hepatosplenomegaly. Musculoskeletal: No joint deformity or effusion.  Full range of motion noted. Neurological: No deficits noted on motor, sensory and deep tendon reflex exam. Skin: No petechial rash or dryness.  Appeared moist.       Lab Results: Lab Results  Component Value Date   WBC 8.2 10/09/2019   HGB 11.4 (L) 10/09/2019   HCT 34.2 (L) 10/09/2019   MCV 83.2 10/09/2019   PLT 211 10/09/2019     Chemistry      Component Value Date/Time   NA 139 10/09/2019 1900   NA 137 04/07/2019 1021   K 4.2 10/09/2019 1900   CL 104 10/09/2019 1900   CO2 23 10/09/2019 1900   BUN 28 (H) 10/09/2019 1900   BUN 29 (H) 04/07/2019 1021   CREATININE 1.16 10/09/2019 1900   CREATININE 0.97 10/06/2019 0755      Component Value Date/Time   CALCIUM 8.4 (L) 10/09/2019 1900   ALKPHOS 56 10/09/2019 1900   AST 18 10/09/2019 1900   AST 16 10/06/2019 0755   ALT 14 10/09/2019 1900   ALT 13 10/06/2019 0755   BILITOT 0.8 10/09/2019 1900   BILITOT 0.5 10/06/2019 0755       Impression and Plan:  74 year old man with:   1.    Locally advanced left renal pelvis tumor diagnosed in May 2021.  He was found to have high-grade urothelial carcinoma on a biopsy with regional adenopathy.   He is currently receiving neoadjuvant chemotherapy without any major complications.  Long-term complication associated with this treatment were reiterated including nausea, vomiting, myelosuppression, neutropenia and potential sepsis.  Insufficiency associated with platinum based therapy were also reviewed.  He is agreeable to proceed at this time.  I anticipate 4 cycles of therapy before staging imaging studies and potential surgical resection if he has a good response to  therapy.   2. IV access: Port-A-Cath inserted without any complications at this time.   3. Antiemetics: Compazine is available to him without any recent nausea or vomiting.  4. Renal function surveillance:Kidney function remains stable and will continue to monitor on platinum based therapy.  He will continue to receive platinum based therapy as long as his creatinine clearance above 50 cc/min.  Currently is between 80 and 4.  5. Goals of care:  He has locally advanced disease but treatment remains curative with aggressive measures including chemotherapy and surgery.  6.  Hematuria: Related to his renal pelvis tumor and anticipate improvement with subsequent cycles of chemotherapy.  7. Follow-up: He will return in 2 weeks for the start of cycle 2 of therapy.   30  minutes were spent on this encounter.  Time was dedicated to updating his disease status, reviewing treatment options, addressing complication related therapy and  future plan of care reviewed.     [name], MD 6/25/202111:08 AM

## 2019-10-16 ENCOUNTER — Encounter: Payer: Self-pay | Admitting: Physician Assistant

## 2019-10-16 NOTE — Progress Notes (Signed)
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  Advised patient what is needed and he may bring on 10/27/19. He verbalized understanding.  He has my card for any additional financial questions or concerns.

## 2019-10-17 ENCOUNTER — Telehealth: Payer: Self-pay | Admitting: Oncology

## 2019-10-17 NOTE — Telephone Encounter (Signed)
Scheduled per los, called patient and left a voicemail. 

## 2019-10-18 ENCOUNTER — Encounter: Payer: Self-pay | Admitting: Oncology

## 2019-10-18 NOTE — Progress Notes (Signed)
Patient called to inform after speaking with spouse, they are in a good place financially and have decided to decline the grant. There is no need for meeting on 10/27/19.  He has my card for any additional financial questions or concerns.

## 2019-10-19 ENCOUNTER — Other Ambulatory Visit: Payer: Self-pay

## 2019-10-19 ENCOUNTER — Ambulatory Visit (HOSPITAL_COMMUNITY)
Admission: RE | Admit: 2019-10-19 | Discharge: 2019-10-19 | Disposition: A | Discharge status: Home / Self Care | Payer: PPO | Source: Ambulatory Visit | Attending: Cardiology | Admitting: Cardiology

## 2019-10-19 DIAGNOSIS — M79601 Pain in right arm: Secondary | ICD-10-CM | POA: Insufficient documentation

## 2019-10-26 NOTE — Progress Notes (Signed)
[facility] OFFICE PROGRESS NOTE  [name], MD [place] [address]  DIAGNOSIS: high-grade urothelial carcinoma of the left renal pelvis with a regional lymph node indicating locally advanced disease diagnosed in May 2021.  PRIOR THERAPY: He is status post biopsy obtained on Aug 21, 2019 which showed high-grade urothelial carcinoma of the left renal pelvis.  CURRENT THERAPY: Chemotherapy utilizing gemcitabine 1000 mg/m2 and cisplatin 50 mg/m2 given on day 1 and cisplatin given on day 8 start him on 10/06/2019.  He is here for day 1 of cycle 2 of therapy.  INTERVAL HISTORY: Lawrence Hall 74 y.o. male returns to the clinic today for a follow-up visit.  The patient is feeling fairly well today without any concerning complaints except for fatigue. He is inquiring what he can do to help his fatigue. He is planning on trying to walk this weekend and be more active. He tolerated his first cycle of treatment fairly well without any concerning adverse side effects besides the fatigue.  He presented to the emergency room following day 1 of cycle 1 for the chief complaint of hematuria which has resolved at this time.  He denies any fever, chills, night sweats, or weight loss.  He denies any vomiting or diarrhea. He reports mild nausea x1 with treatment which was controlleld with his anti-emetic. He has some baseline constipation for which he uses a laxative and stool softener. Denies abdominal pain.  He denies any hematuria, dysuria, or urinary incontinence.  He continues to follow-up with urology Dr. Alinda Money.  The patient is here today for evaluation before starting day 1 of cycle 2.  MEDICAL HISTORY: Past Medical History:  Diagnosis Date  . Arthritis    mild hands  . Asthma    pt denies 4/21   . Chest pain 07/25/2012  . Coronary artery disease   . Dysrhythmia    PAC'S, hx of atrial fib after OHS 04/2018  . ED (erectile dysfunction)   . H/O hiatal hernia    . Heart murmur   . Hypercholesterolemia   . Hypertension    MARGINALLY HIGH - NO MEDS -MONITORS  . Myocardial infarction ([place])    2017   . Pneumonia    hx of 2018   . PONV (postoperative nausea and vomiting)   . Prostate cancer ([place]) 03/27/2011   prostate bx=adenocarcinoma,  . Skin cancer 2008   basal cell face /removed  . Sleep apnea    borderline sleep apnea per patient no devices     ALLERGIES:  is allergic to other.  MEDICATIONS:  Current Outpatient Medications  Medication Sig Dispense Refill  . amLODipine (NORVASC) 2.5 MG tablet Take 1 tablet (2.5 mg total) by mouth daily. (Patient not taking: Reported on 10/09/2019) 30 tablet 6  . aspirin EC 81 MG tablet Take 81 mg by mouth daily.    [name] atorvastatin (LIPITOR) 80 MG tablet TAKE 1 TABLET ONCE DAILY AT 6 PM. (Patient taking differently: Take 80 mg by mouth daily at 6 PM. ) 90 tablet 2  . chlorhexidine (PERIDEX) 0.12 % solution Rinse with 15 mls twice daily for 30 seconds. Use after breakfast and at bedtime. Spit out excess. Do not swallow. (Patient taking differently: Use as directed 15 mLs in the mouth or throat daily. ) 960 mL 5  . Cholecalciferol (VITAMIN D-3) 125 MCG (5000 UT) TABS Take 5,000 Units by mouth daily.    [name] lidocaine-prilocaine (EMLA) cream Apply 1 application topically as needed. (  Patient taking differently: Apply 1 application topically daily as needed (pain). ) 30 g 0  . metoprolol succinate (TOPROL-XL) 100 MG 24 hr tablet TAKE 1 TABLET AT BEDTIME. TAKE WITH OR IMMEDIATELY FOLLOWING A MEAL. (Patient taking differently: Take 100 mg by mouth daily. ) 90 tablet 0  . nitroGLYCERIN (NITROSTAT) 0.4 MG SL tablet 1 TAB UNDER TONGUE AS NEEDED FOR CHEST PAIN. MAY REPEAT EVERY 5 MIN FOR A TOTAL OF 3 DOSES. (Patient taking differently: Place 0.4 mg under the tongue every 5 (five) minutes as needed for chest pain. ) 25 tablet 4  . prochlorperazine (COMPAZINE) 10 MG tablet Take 10 mg by mouth every 6 (six) hours as needed for  nausea or vomiting.    . traZODone (DESYREL) 50 MG tablet 1/2-1 tablet at bedtime (Patient taking differently: Take 50 mg by mouth at bedtime as needed for sleep. ) 30 tablet 0  . vitamin E 400 UNIT capsule Take 400 Units by mouth every evening.      No current facility-administered medications for this visit.    SURGICAL HISTORY:  Past Surgical History:  Procedure Laterality Date  . AORTIC VALVE REPLACEMENT N/A 04/29/2018   Procedure: AORTIC VALVE REPLACEMENT (AVR) using INSPIRIS RESILIA  AORTIC VALVE 50mm;  Surgeon: Ivin Poot, MD;  Location: [facility];  Service: Open Heart Surgery;  Laterality: N/A;  . basal cell cancer  2008   inside nose  . CARDIAC CATHETERIZATION N/A 06/03/2015   Procedure: Left Heart Cath and Coronary Angiography;  Surgeon: [name] Sine, MD;  Location: [facility];  Service: Cardiovascular;  Laterality: N/A;  . CATARACT EXTRACTION, BILATERAL  2007  . CORONARY ARTERY BYPASS GRAFT N/A 04/29/2018   Procedure: CORONARY ARTERY BYPASS GRAFTING (CABG) X 3; Using Left Internal Mammary Artery (LIMA) and Right Leg Greater Saphenous Vein Graft (SVG);  LIMA to LAD, SVG to OM1, SVG to RCA,;  Surgeon: Ivin Poot, MD;  Location: [facility];  Service: Open Heart Surgery;  Laterality: N/A;  . CYSTOSCOPY/RETROGRADE/URETEROSCOPY Left 08/21/2019   Procedure: CYSTOSCOPY/RETROGRADE/URETEROSCOPY/  LEFT BIOPSY/BRUSH BIOPSY/RENAL WASHINGS/ AND STENT PLACEMENT;  Surgeon: Raynelle Bring, MD;  Location: [facility];  Service: Urology;  Laterality: Left;  . IR IMAGING GUIDED PORT INSERTION  09/28/2019  . RIGHT/LEFT HEART CATH AND CORONARY ANGIOGRAPHY N/A 04/26/2018   Procedure: RIGHT/LEFT HEART CATH AND CORONARY ANGIOGRAPHY;  Surgeon: [name] Sine, MD;  Location: [facility];  Service: Cardiovascular;  Laterality: N/A;  . ROBOT ASSISTED LAPAROSCOPIC RADICAL PROSTATECTOMY  08/10/2011   Procedure: ROBOTIC ASSISTED LAPAROSCOPIC RADICAL PROSTATECTOMY LEVEL 2;  Surgeon: Dutch Gray, MD;   Location: [facility];  Service: Urology;  Laterality: N/A;  . TEE WITHOUT CARDIOVERSION N/A 04/29/2018   Procedure: TRANSESOPHAGEAL ECHOCARDIOGRAM (TEE);  Surgeon: Prescott Gum, Collier Salina, MD;  Location: [facility];  Service: Open Heart Surgery;  Laterality: N/A;  . THYROID SURGERY  infant   uncertain details; no thyroid replacement listed    REVIEW OF SYSTEMS:   Review of Systems  Constitutional: Positive for fatigue. Negative for appetite change, chills, fever and unexpected weight change.  HENT: Negative for mouth sores, nosebleeds, sore throat and trouble swallowing.   Eyes: Negative for eye problems and icterus.  Respiratory: Negative for cough, hemoptysis, shortness of breath and wheezing.   Cardiovascular: Negative for chest pain and leg swelling.  Gastrointestinal: Negative for abdominal pain, constipation, diarrhea, nausea and vomiting.  Genitourinary: Negative for bladder incontinence, difficulty urinating, dysuria, frequency and hematuria.   Musculoskeletal: Negative for back pain, gait  problem, neck pain and neck stiffness.  Skin: Negative for itching and rash.  Neurological: Negative for dizziness, extremity weakness, gait problem, headaches, light-headedness and seizures.  Hematological: Negative for adenopathy. Does not bruise/bleed easily.  Psychiatric/Behavioral: Negative for confusion, depression and sleep disturbance. The patient is not nervous/anxious.     PHYSICAL EXAMINATION:  Blood pressure (!) 144/74, pulse 78, temperature (!) 97.5 F (36.4 C), resp. rate 20, height 6' (1.829 m), weight 194 lb 4.8 oz (88.1 kg), SpO2 99 %.  ECOG PERFORMANCE STATUS: 1 - Symptomatic but completely ambulatory  Physical Exam  Constitutional: Oriented to person, place, and time and well-developed, well-nourished, and in no distress.  HENT:  Head: Normocephalic and atraumatic.  Mouth/Throat: Oropharynx is clear and moist. No oropharyngeal exudate.  Eyes: Conjunctivae are normal. Right eye exhibits  no discharge. Left eye exhibits no discharge. No scleral icterus.  Neck: Normal range of motion. Neck supple.  Cardiovascular: Normal rate, regular rhythm, normal heart sounds and intact distal pulses.   Pulmonary/Chest: Effort normal and breath sounds normal. No respiratory distress. No wheezes. No rales.  Abdominal: Soft. Bowel sounds are normal. Exhibits no distension and no mass. There is no tenderness.  Musculoskeletal: Normal range of motion. Exhibits no edema.  Lymphadenopathy:    No cervical adenopathy.  Neurological: Alert and oriented to person, place, and time. Exhibits normal muscle tone. Gait normal. Coordination normal.  Skin: Skin is warm and dry. No rash noted. Not diaphoretic. No erythema. No pallor.  Psychiatric: Mood, memory and judgment normal.  Vitals reviewed.  LABORATORY DATA: Lab Results  Component Value Date   WBC 5.6 10/27/2019   HGB 9.3 (L) 10/27/2019   HCT 28.3 (L) 10/27/2019   MCV 82.7 10/27/2019   PLT 446 (H) 10/27/2019      Chemistry      Component Value Date/Time   NA 139 10/27/2019 0813   NA 137 04/07/2019 1021   K 3.9 10/27/2019 0813   CL 107 10/27/2019 0813   CO2 24 10/27/2019 0813   BUN 22 10/27/2019 0813   BUN 29 (H) 04/07/2019 1021   CREATININE 1.10 10/27/2019 0813      Component Value Date/Time   CALCIUM 8.9 10/27/2019 0813   ALKPHOS 74 10/27/2019 0813   AST 15 10/27/2019 0813   ALT 12 10/27/2019 0813   BILITOT 0.3 10/27/2019 0813       RADIOGRAPHIC STUDIES:  IR IMAGING GUIDED PORT INSERTION  Result Date: 09/28/2019 CLINICAL DATA:  Bladder carcinoma and need for porta cath for chemotherapy. EXAM: IMPLANTED PORT A CATH PLACEMENT WITH ULTRASOUND AND FLUOROSCOPIC GUIDANCE ANESTHESIA/SEDATION: 2.0 mg IV Versed; 100 mcg IV Fentanyl Total Moderate Sedation Time:  44 minutes The patient's level of consciousness and physiologic status were continuously monitored during the procedure by Radiology nursing. Additional Medications: 2 g IV  Ancef. FLUOROSCOPY TIME:  48 seconds.  13.0 mGy. PROCEDURE: The procedure, risks, benefits, and alternatives were explained to the patient. Questions regarding the procedure were encouraged and answered. The patient understands and consents to the procedure. A time-out was performed prior to initiating the procedure. Ultrasound was utilized to confirm patency of the right internal jugular vein. The right neck and chest were prepped with chlorhexidine in a sterile fashion, and a sterile drape was applied covering the operative field. Maximum barrier sterile technique with sterile gowns and gloves were used for the procedure. Local anesthesia was provided with 1% lidocaine. After creating a small venotomy incision, a 21 gauge needle was advanced into the right internal  jugular vein under direct, real-time ultrasound guidance. Ultrasound image documentation was performed. After securing guidewire access, an 8 Fr dilator was placed. A J-wire was kinked to measure appropriate catheter length. A subcutaneous port pocket was then created along the upper chest wall utilizing sharp and blunt dissection. Portable cautery was utilized. The pocket was irrigated with sterile saline. A single lumen power injectable port was chosen for placement. The 8 Fr catheter was tunneled from the port pocket site to the venotomy incision. The port was placed in the pocket. External catheter was trimmed to appropriate length based on guidewire measurement. At the venotomy, an 8 Fr peel-away sheath was placed over a guidewire. The catheter was then placed through the sheath and the sheath removed. Final catheter positioning was confirmed and documented with a fluoroscopic spot image. The port was accessed with a needle and aspirated and flushed with heparinized saline. The access needle was removed. The venotomy and port pocket incisions were closed with subcutaneous 3-0 Monocryl and subcuticular 4-0 Vicryl. Dermabond was applied to both  incisions. COMPLICATIONS: COMPLICATIONS None FINDINGS: After catheter placement, the tip lies at the cavo-atrial junction. The catheter aspirates normally and is ready for immediate use. IMPRESSION: Placement of single lumen port a cath via right internal jugular vein. The catheter tip lies at the cavo-atrial junction. A power injectable port a cath was placed and is ready for immediate use. Electronically Signed   By: Aletta Edouard M.D.   On: 09/28/2019 13:46   VAS [place] UPPER EXTREMITY VENOUS DUPLEX  Result Date: 10/19/2019 UPPER VENOUS STUDY  Indications: Pain Other Indications: Patient states he has discomfort in his right arm since the right torn rotator cuff injury in 05/2019. Risk Factors: Trauma Right torn rotator cuff 05/2019. Performing Technologist: Wilkie Aye RVT  Examination Guidelines: A complete evaluation includes B-mode imaging, spectral Doppler, color Doppler, and power Doppler as needed of all accessible portions of each vessel. Bilateral testing is considered an integral part of a complete examination. Limited examinations for reoccurring indications may be performed as noted.  Right Findings: +----------+------------+---------+-----------+----------+-------+ RIGHT     CompressiblePhasicitySpontaneousPropertiesSummary +----------+------------+---------+-----------+----------+-------+ IJV           Full       Yes       Yes                      +----------+------------+---------+-----------+----------+-------+ Subclavian    Full       Yes       Yes                      +----------+------------+---------+-----------+----------+-------+ Axillary      Full       Yes       Yes                      +----------+------------+---------+-----------+----------+-------+ Brachial      Full       Yes       Yes                      +----------+------------+---------+-----------+----------+-------+ Radial        Full       Yes       Yes                       +----------+------------+---------+-----------+----------+-------+ Ulnar         Full       Yes  Yes                      +----------+------------+---------+-----------+----------+-------+ Cephalic      Full       Yes       Yes                      +----------+------------+---------+-----------+----------+-------+ Basilic       Full       Yes       Yes                      +----------+------------+---------+-----------+----------+-------+  Left Findings: +----------+------------+---------+-----------+----------+-------+ LEFT      CompressiblePhasicitySpontaneousPropertiesSummary +----------+------------+---------+-----------+----------+-------+ IJV           Full       Yes       Yes                      +----------+------------+---------+-----------+----------+-------+ Subclavian    Full       Yes       Yes                      +----------+------------+---------+-----------+----------+-------+  Summary:  Right: No evidence of deep vein thrombosis in the upper extremity. No evidence of superficial vein thrombosis in the upper extremity. No evidence of thrombosis in the subclavian.  Left: Findings consistent with age indeterminate superficial vein thrombosis involving the left cephalic vein.  *See table(s) above for measurements and observations.  Diagnosing physician: Quay Burow MD Electronically signed by Quay Burow MD on 10/19/2019 at 2:15:17 PM.    Final      ASSESSMENT/PLAN:  74 year old man with:   1.   Locally advanced left renal pelvis tumor diagnosed in May 2021.  He was found to have high-grade urothelial carcinoma on a biopsy with regional adenopathy.   He is currently receiving neoadjuvant chemotherapy without any major complications.  Long-term complication associated with this treatment were reiterated including nausea, vomiting, myelosuppression, neutropenia and potential sepsis.  Insufficiency associated with platinum based therapy were  also reviewed.  He is agreeable to proceed at this time.  Dr. Alen Blew anticipates 4 cycles of therapy before staging imaging studies and potential surgical resection if he has a good response to therapy.  2. IV access: Port-A-Cath inserted without any complications at this time.  3. Antiemetics: Compazine is available to him without any recent nausea or vomiting.  4. Renal function surveillance:Kidney function remains stable and will continue to monitor on platinum based therapy.  He will continue to receive platinum based therapy as long as his creatinine clearance above 50 cc/min.  Currently is between 73 and 60. The patient was encouraged today to continue to drink plenty of fluid.  5. Goals of care: He has locally advanced disease but treatment remains curative with aggressive measures including chemotherapy and surgery.  6.  Hematuria: Related to his renal pelvis tumor and anticipate improvement with subsequent cycles of chemotherapy.  7. Follow-up: He will return in 1 week for the start of Day 8 cycle 2 of therapy.    No orders of the defined types were placed in this encounter.    Lerlene Treadwell L Momin Misko, PA-C 10/27/19

## 2019-10-27 ENCOUNTER — Inpatient Hospital Stay: Payer: PPO | Attending: Oncology

## 2019-10-27 ENCOUNTER — Other Ambulatory Visit: Payer: Self-pay

## 2019-10-27 ENCOUNTER — Inpatient Hospital Stay: Payer: PPO

## 2019-10-27 ENCOUNTER — Inpatient Hospital Stay: Payer: PPO | Admitting: Physician Assistant

## 2019-10-27 ENCOUNTER — Other Ambulatory Visit: Payer: Self-pay | Admitting: Physician Assistant

## 2019-10-27 VITALS — BP 144/74 | HR 78 | Temp 97.5°F | Resp 20 | Ht 72.0 in | Wt 194.3 lb

## 2019-10-27 DIAGNOSIS — E7800 Pure hypercholesterolemia, unspecified: Secondary | ICD-10-CM | POA: Diagnosis not present

## 2019-10-27 DIAGNOSIS — Z85828 Personal history of other malignant neoplasm of skin: Secondary | ICD-10-CM | POA: Insufficient documentation

## 2019-10-27 DIAGNOSIS — I10 Essential (primary) hypertension: Secondary | ICD-10-CM | POA: Diagnosis not present

## 2019-10-27 DIAGNOSIS — Z79899 Other long term (current) drug therapy: Secondary | ICD-10-CM | POA: Diagnosis not present

## 2019-10-27 DIAGNOSIS — I2510 Atherosclerotic heart disease of native coronary artery without angina pectoris: Secondary | ICD-10-CM | POA: Diagnosis not present

## 2019-10-27 DIAGNOSIS — Z7982 Long term (current) use of aspirin: Secondary | ICD-10-CM | POA: Diagnosis not present

## 2019-10-27 DIAGNOSIS — R599 Enlarged lymph nodes, unspecified: Secondary | ICD-10-CM | POA: Diagnosis not present

## 2019-10-27 DIAGNOSIS — Z8546 Personal history of malignant neoplasm of prostate: Secondary | ICD-10-CM | POA: Diagnosis not present

## 2019-10-27 DIAGNOSIS — Z95828 Presence of other vascular implants and grafts: Secondary | ICD-10-CM

## 2019-10-27 DIAGNOSIS — C679 Malignant neoplasm of bladder, unspecified: Secondary | ICD-10-CM

## 2019-10-27 DIAGNOSIS — R112 Nausea with vomiting, unspecified: Secondary | ICD-10-CM | POA: Insufficient documentation

## 2019-10-27 DIAGNOSIS — Z9079 Acquired absence of other genital organ(s): Secondary | ICD-10-CM | POA: Diagnosis not present

## 2019-10-27 DIAGNOSIS — C652 Malignant neoplasm of left renal pelvis: Secondary | ICD-10-CM | POA: Diagnosis not present

## 2019-10-27 DIAGNOSIS — I252 Old myocardial infarction: Secondary | ICD-10-CM | POA: Insufficient documentation

## 2019-10-27 DIAGNOSIS — R5383 Other fatigue: Secondary | ICD-10-CM | POA: Diagnosis not present

## 2019-10-27 DIAGNOSIS — Z5111 Encounter for antineoplastic chemotherapy: Secondary | ICD-10-CM | POA: Insufficient documentation

## 2019-10-27 DIAGNOSIS — G4730 Sleep apnea, unspecified: Secondary | ICD-10-CM | POA: Insufficient documentation

## 2019-10-27 DIAGNOSIS — R319 Hematuria, unspecified: Secondary | ICD-10-CM | POA: Insufficient documentation

## 2019-10-27 LAB — CBC WITH DIFFERENTIAL (CANCER CENTER ONLY)
Abs Immature Granulocytes: 0.17 10*3/uL — ABNORMAL HIGH (ref 0.00–0.07)
Basophils Absolute: 0.1 10*3/uL (ref 0.0–0.1)
Basophils Relative: 1 %
Eosinophils Absolute: 0.1 10*3/uL (ref 0.0–0.5)
Eosinophils Relative: 2 %
HCT: 28.3 % — ABNORMAL LOW (ref 39.0–52.0)
Hemoglobin: 9.3 g/dL — ABNORMAL LOW (ref 13.0–17.0)
Immature Granulocytes: 3 %
Lymphocytes Relative: 22 %
Lymphs Abs: 1.3 10*3/uL (ref 0.7–4.0)
MCH: 27.2 pg (ref 26.0–34.0)
MCHC: 32.9 g/dL (ref 30.0–36.0)
MCV: 82.7 fL (ref 80.0–100.0)
Monocytes Absolute: 0.7 10*3/uL (ref 0.1–1.0)
Monocytes Relative: 12 %
Neutro Abs: 3.4 10*3/uL (ref 1.7–7.7)
Neutrophils Relative %: 60 %
Platelet Count: 446 10*3/uL — ABNORMAL HIGH (ref 150–400)
RBC: 3.42 MIL/uL — ABNORMAL LOW (ref 4.22–5.81)
RDW: 15.2 % (ref 11.5–15.5)
WBC Count: 5.6 10*3/uL (ref 4.0–10.5)
nRBC: 0 % (ref 0.0–0.2)

## 2019-10-27 LAB — CMP (CANCER CENTER ONLY)
ALT: 12 U/L (ref 0–44)
AST: 15 U/L (ref 15–41)
Albumin: 3 g/dL — ABNORMAL LOW (ref 3.5–5.0)
Alkaline Phosphatase: 74 U/L (ref 38–126)
Anion gap: 8 (ref 5–15)
BUN: 22 mg/dL (ref 8–23)
CO2: 24 mmol/L (ref 22–32)
Calcium: 8.9 mg/dL (ref 8.9–10.3)
Chloride: 107 mmol/L (ref 98–111)
Creatinine: 1.1 mg/dL (ref 0.61–1.24)
GFR, Est AFR Am: >60 mL/min (ref >60)
GFR, Estimated: >60 mL/min (ref >60)
Glucose, Bld: 153 mg/dL — ABNORMAL HIGH (ref 70–99)
Potassium: 3.9 mmol/L (ref 3.5–5.1)
Sodium: 139 mmol/L (ref 135–145)
Total Bilirubin: 0.3 mg/dL (ref 0.3–1.2)
Total Protein: 6.9 g/dL (ref 6.5–8.1)

## 2019-10-27 MED ORDER — SODIUM CHLORIDE 0.9 % IV SOLN
47.0000 mg/m2 | Freq: Once | INTRAVENOUS | Status: AC
  Administered 2019-10-27: 100 mg via INTRAVENOUS
  Filled 2019-10-27: qty 100

## 2019-10-27 MED ORDER — SODIUM CHLORIDE 0.9 % IV SOLN
10.0000 mg | Freq: Once | INTRAVENOUS | Status: AC
  Administered 2019-10-27: 10 mg via INTRAVENOUS
  Filled 2019-10-27: qty 10

## 2019-10-27 MED ORDER — SODIUM CHLORIDE 0.9% FLUSH
10.0000 mL | INTRAVENOUS | Status: DC | PRN
  Administered 2019-10-27: 10 mL via INTRAVENOUS
  Filled 2019-10-27: qty 10

## 2019-10-27 MED ORDER — PALONOSETRON HCL INJECTION 0.25 MG/5ML
0.2500 mg | Freq: Once | INTRAVENOUS | Status: AC
  Administered 2019-10-27: 0.25 mg via INTRAVENOUS

## 2019-10-27 MED ORDER — PALONOSETRON HCL INJECTION 0.25 MG/5ML
INTRAVENOUS | Status: AC
  Filled 2019-10-27: qty 5

## 2019-10-27 MED ORDER — HEPARIN SOD (PORK) LOCK FLUSH 100 UNIT/ML IV SOLN
500.0000 [IU] | Freq: Once | INTRAVENOUS | Status: AC | PRN
  Administered 2019-10-27: 500 [IU]
  Filled 2019-10-27: qty 5

## 2019-10-27 MED ORDER — SODIUM CHLORIDE 0.9 % IV SOLN
Freq: Once | INTRAVENOUS | Status: AC
  Filled 2019-10-27: qty 10

## 2019-10-27 MED ORDER — SODIUM CHLORIDE 0.9 % IV SOLN
150.0000 mg | Freq: Once | INTRAVENOUS | Status: AC
  Administered 2019-10-27: 150 mg via INTRAVENOUS
  Filled 2019-10-27: qty 150

## 2019-10-27 MED ORDER — SODIUM CHLORIDE 0.9% FLUSH
10.0000 mL | INTRAVENOUS | Status: DC | PRN
  Administered 2019-10-27: 10 mL
  Filled 2019-10-27: qty 10

## 2019-10-27 MED ORDER — SODIUM CHLORIDE 0.9 % IV SOLN
Freq: Once | INTRAVENOUS | Status: AC
  Filled 2019-10-27: qty 250

## 2019-10-27 MED ORDER — SODIUM CHLORIDE 0.9 % IV SOLN
1000.0000 mg/m2 | Freq: Once | INTRAVENOUS | Status: AC
  Administered 2019-10-27: 2128 mg via INTRAVENOUS
  Filled 2019-10-27: qty 55.97

## 2019-10-27 NOTE — Patient Instructions (Signed)
[facility] Discharge Instructions for Patients Receiving Chemotherapy  Today you received the following chemotherapy agents Gemzar; Cisplatin  To help prevent nausea and vomiting after your treatment, we encourage you to take your nausea medication as directed If you develop nausea and vomiting that is not controlled by your nausea medication, call the clinic.   BELOW ARE SYMPTOMS THAT SHOULD BE REPORTED IMMEDIATELY:  *FEVER GREATER THAN 100.5 F  *CHILLS WITH OR WITHOUT FEVER  NAUSEA AND VOMITING THAT IS NOT CONTROLLED WITH YOUR NAUSEA MEDICATION  *UNUSUAL SHORTNESS OF BREATH  *UNUSUAL BRUISING OR BLEEDING  TENDERNESS IN MOUTH AND THROAT WITH OR WITHOUT PRESENCE OF ULCERS  *URINARY PROBLEMS  *BOWEL PROBLEMS  UNUSUAL RASH Items with * indicate a potential emergency and should be followed up as soon as possible.  Feel free to call the clinic should you have any questions or concerns. The clinic phone number is [phone].  Please show the CHEMO ALERT CARD at check-in to the Emergency Department and triage nurse.   

## 2019-10-30 ENCOUNTER — Telehealth: Payer: Self-pay | Admitting: *Deleted

## 2019-10-31 ENCOUNTER — Encounter: Payer: Self-pay | Admitting: Family Medicine

## 2019-10-31 ENCOUNTER — Other Ambulatory Visit: Payer: Self-pay

## 2019-10-31 ENCOUNTER — Ambulatory Visit: Payer: PPO | Admitting: Family Medicine

## 2019-10-31 ENCOUNTER — Ambulatory Visit: Payer: Self-pay

## 2019-10-31 VITALS — BP 122/68 | HR 75 | Ht 72.0 in | Wt 192.0 lb

## 2019-10-31 DIAGNOSIS — S46011A Strain of muscle(s) and tendon(s) of the rotator cuff of right shoulder, initial encounter: Secondary | ICD-10-CM | POA: Diagnosis not present

## 2019-10-31 DIAGNOSIS — G8929 Other chronic pain: Secondary | ICD-10-CM

## 2019-10-31 DIAGNOSIS — M25511 Pain in right shoulder: Secondary | ICD-10-CM | POA: Diagnosis not present

## 2019-10-31 NOTE — Assessment & Plan Note (Signed)
Patient does have more of a rotator cuff tear.  Still noted some mild retraction of 1.5 cm.  With patient though having a chemotherapy I do not think that this changes any true medical management at this moment.  Patient wants to hold on any formal physical therapy still at this time.  Follow-up with me again 6 weeks.  At that time we will discuss either starting physical therapy or if any increasing weakness or pain MRI so patient does have the opportunity for potential surgical intervention if necessary.

## 2019-10-31 NOTE — Progress Notes (Signed)
Lawrence Hall [address] [place] Phone: [phone] Subjective:   I Lawrence Hall am serving as a Education administrator for Dr. [name].  This visit occurred during the SARS-CoV-2 public health emergency.  Safety protocols were in place, including screening questions prior to the visit, additional usage of staff PPE, and extensive cleaning of exam room while observing appropriate contact time as indicated for disinfecting solutions.   I'm seeing this patient by the request  of:  [name], MD  CC: Right shoulder pain follow-up  MEQ:[id number]   09/19/2019 Patient does have a bicep muscular tendon tear but also has a rotator cuff tear of the supraspinatus.  We will get x-ray secondary to patient having a malignant tumor and make sure there is no bony metastasis.  Patient's previous work-up for this has been unremarkable.  I do believe that patient could be a surgical candidate but with everything going on and patient is hardly at this point we will try conservative therapy first.  Patient recently did have an intra-articular injection and we will continue to monitor.  Discussed home exercises.  Patient work with Product/process development scientist.  Patient will do more of a compression sleeve secondary to the muscle tear noted of the bicep.  Follow-up again in 4 to 6 weeks  Update 10/31/2019 Lawrence Hall is a 74 y.o. male coming in with complaint of right shoulder pain. Patient states he is doing a lot better. States he has a little tightness with flexion overhead but ROM is restored. Using topical cream and ice.  Patient was found to have a supraspinatus tear previously.  Patient's x-rays did not show any bony metastasis.  Patient has been undergoing treatment including chemotherapy at this moment.  Has been feeling relatively good at the moment.      Past Medical History:  Diagnosis Date  . Arthritis    mild hands  . Asthma    pt denies 4/21   . Chest pain  07/25/2012  . Coronary artery disease   . Dysrhythmia    PAC'S, hx of atrial fib after OHS 04/2018  . ED (erectile dysfunction)   . H/O hiatal hernia   . Heart murmur   . Hypercholesterolemia   . Hypertension    MARGINALLY HIGH - NO MEDS -MONITORS  . Myocardial infarction ([place])    2017   . Pneumonia    hx of 2018   . PONV (postoperative nausea and vomiting)   . Prostate cancer ([place]) 03/27/2011   prostate bx=adenocarcinoma,  . Skin cancer 2008   basal cell face /removed  . Sleep apnea    borderline sleep apnea per patient no devices    Past Surgical History:  Procedure Laterality Date  . AORTIC VALVE REPLACEMENT N/A 04/29/2018   Procedure: AORTIC VALVE REPLACEMENT (AVR) using INSPIRIS RESILIA  AORTIC VALVE 72mm;  Surgeon: Ivin Poot, MD;  Location: [facility];  Service: Open Heart Surgery;  Laterality: N/A;  . basal cell cancer  2008   inside nose  . CARDIAC CATHETERIZATION N/A 06/03/2015   Procedure: Left Heart Cath and Coronary Angiography;  Surgeon: [name] Sine, MD;  Location: [place] [facility];  Service: Cardiovascular;  Laterality: N/A;  . CATARACT EXTRACTION, BILATERAL  2007  . CORONARY ARTERY BYPASS GRAFT N/A 04/29/2018   Procedure: CORONARY ARTERY BYPASS GRAFTING (CABG) X 3; Using Left Internal Mammary Artery (LIMA) and Right Leg Greater Saphenous Vein Graft (SVG);  LIMA to LAD, SVG to  OM1, SVG to RCA,;  Surgeon: Prescott Gum, Collier Salina, MD;  Location: [facility];  Service: Open Heart Surgery;  Laterality: N/A;  . CYSTOSCOPY/RETROGRADE/URETEROSCOPY Left 08/21/2019   Procedure: CYSTOSCOPY/RETROGRADE/URETEROSCOPY/  LEFT BIOPSY/BRUSH BIOPSY/RENAL WASHINGS/ AND STENT PLACEMENT;  Surgeon: Raynelle Bring, MD;  Location: [facility];  Service: Urology;  Laterality: Left;  . IR IMAGING GUIDED PORT INSERTION  09/28/2019  . RIGHT/LEFT HEART CATH AND CORONARY ANGIOGRAPHY N/A 04/26/2018   Procedure: RIGHT/LEFT HEART CATH AND CORONARY ANGIOGRAPHY;  Surgeon: [name] Sine, MD;  Location: [facility];   Service: Cardiovascular;  Laterality: N/A;  . ROBOT ASSISTED LAPAROSCOPIC RADICAL PROSTATECTOMY  08/10/2011   Procedure: ROBOTIC ASSISTED LAPAROSCOPIC RADICAL PROSTATECTOMY LEVEL 2;  Surgeon: Dutch Gray, MD;  Location: [facility];  Service: Urology;  Laterality: N/A;  . TEE WITHOUT CARDIOVERSION N/A 04/29/2018   Procedure: TRANSESOPHAGEAL ECHOCARDIOGRAM (TEE);  Surgeon: Prescott Gum, Collier Salina, MD;  Location: [place] [facility];  Service: Open Heart Surgery;  Laterality: N/A;  . THYROID SURGERY  infant   uncertain details; no thyroid replacement listed   Social History   Socioeconomic History  . Marital status: Married    Spouse name: [name]   . Number of children: 0  . Years of education: 15 years   . Highest education level: Not on file  Occupational History    Employer: [employer]    Comment: customer service [employer]  Tobacco Use  . Smoking status: Never Smoker  . Smokeless tobacco: Never Used  Vaping Use  . Vaping Use: Never used  Substance and Sexual Activity  . Alcohol use: Not Currently  . Drug use: No  . Sexual activity: Not on file  Other Topics Concern  . Not on file  Social History Narrative  . Not on file   Social Determinants of Health   Financial Resource Strain:   . Difficulty of Paying Living Expenses:   Food Insecurity:   . Worried About Charity fundraiser in the Last Year:   . Arboriculturist in the Last Year:   Transportation Needs:   . Film/video editor (Medical):   [name] Lack of Transportation (Non-Medical):   Physical Activity:   . Days of Exercise per Week:   . Minutes of Exercise per Session:   Stress:   . Feeling of Stress :   Social Connections:   . Frequency of Communication with Friends and Family:   . Frequency of Social Gatherings with Friends and Family:   . Attends Religious Services:   . Active Member of Clubs or Organizations:   . Attends Archivist Meetings:   [name] Marital Status:    Allergies  Allergen Reactions  . Other Itching and  Other (See Comments)    Pet dander, seasonal allergies = Runny nose, itchy eyes   Family History  Problem Relation Age of Onset  . Prostate cancer Father 4       in his 70's/other comorbidities/79 deceased  . Alcohol abuse Father   . Liver disease Father   . Heart attack Father 73  . Breast cancer Mother 17       mets to bone,deceased age 88  . Cervical cancer Sister   . Cervical cancer Sister   . Breast cancer Sister   . CAD Brother        Died of sudden cardiac death/MI     Current Outpatient Medications (Cardiovascular):  .  amLODipine (NORVASC) 2.5 MG tablet, Take 1 tablet (2.5 mg total) by mouth  daily. .  atorvastatin (LIPITOR) 80 MG tablet, TAKE 1 TABLET ONCE DAILY AT 6 PM. (Patient taking differently: Take 80 mg by mouth daily at 6 PM. ) .  metoprolol succinate (TOPROL-XL) 100 MG 24 hr tablet, TAKE 1 TABLET AT BEDTIME. TAKE WITH OR IMMEDIATELY FOLLOWING A MEAL. (Patient taking differently: Take 100 mg by mouth daily. ) .  nitroGLYCERIN (NITROSTAT) 0.4 MG SL tablet, 1 TAB UNDER TONGUE AS NEEDED FOR CHEST PAIN. MAY REPEAT EVERY 5 MIN FOR A TOTAL OF 3 DOSES. (Patient taking differently: Place 0.4 mg under the tongue every 5 (five) minutes as needed for chest pain. )   Current Outpatient Medications (Analgesics):  .  aspirin EC 81 MG tablet, Take 81 mg by mouth daily.   Current Outpatient Medications (Other):  .  chlorhexidine (PERIDEX) 0.12 % solution, Rinse with 15 mls twice daily for 30 seconds. Use after breakfast and at bedtime. Spit out excess. Do not swallow. (Patient taking differently: Use as directed 15 mLs in the mouth or throat daily. ) .  Cholecalciferol (VITAMIN D-3) 125 MCG (5000 UT) TABS, Take 5,000 Units by mouth daily. .  prochlorperazine (COMPAZINE) 10 MG tablet, Take 10 mg by mouth every 6 (six) hours as needed for nausea or vomiting. .  traZODone (DESYREL) 50 MG tablet, 1/2-1 tablet at bedtime (Patient taking differently: Take 50 mg by mouth at bedtime as  needed for sleep. ) .  vitamin E 400 UNIT capsule, Take 400 Units by mouth every evening.    Reviewed prior external information including notes and imaging from  primary care provider As well as notes that were available from care everywhere and other healthcare systems.  Past medical history, social, surgical and family history all reviewed in electronic medical record.  No pertanent information unless stated regarding to the chief complaint.   Review of Systems:  No headache, visual changes, nausea, vomiting, diarrhea, constipation, dizziness, abdominal pain, skin rash, fevers, chills, night sweats, weight loss, swollen lymph nodes, body aches, joint swelling, chest pain, shortness of breath, mood changes. POSITIVE muscle aches  Objective  Blood pressure 122/68, pulse 75, height 6' (1.829 m), weight 192 lb (87.1 kg), SpO2 98 %.   General: No apparent distress alert and oriented x3 mood and affect normal, dressed appropriately.  HEENT: Pupils equal, extraocular movements intact  Respiratory: Patient's speak in full sentences and does not appear short of breath  Cardiovascular: No lower extremity edema, non tender, no erythema  Neuro: Cranial nerves II through XII are intact, neurovascularly intact in all extremities with 2+ DTRs and 2+ pulses.  Gait normal with good balance and coordination.  MSK: Mild arthritic changes of multiple joints Right shoulder exam does show some very mild atrophy noted.  Abnormality still noted of the bicep muscle.  Patient does have some mild limited range of motion of the last 10 degrees of forward flexion in the last 5 degrees of external range of motion.  Limited musculoskeletal ultrasound was performed and interpreted by Lyndal Pulley  Limited ultrasound shows that patient does have a tear still noted of the supraspinatus.  Considered to have almost near full-thickness tear noted.  1.5 cm retraction at the most.  Some mild improvement from previous exam  with some new tendon formation noted. Impression: Rotator cuff tear mild interval improvement    Impression and Recommendations:     The above documentation has been reviewed and is accurate and complete Lyndal Pulley, DO       Note:  This dictation was prepared with Dragon dictation along with smaller phrase technology. Any transcriptional errors that result from this process are unintentional.

## 2019-10-31 NOTE — Patient Instructions (Signed)
Good to see you  Doing great  Keep pushing it with the exercises No real change  See me again in 6 weeks and we will discuss PT or MRI

## 2019-11-01 ENCOUNTER — Other Ambulatory Visit: Payer: Self-pay | Admitting: Urology

## 2019-11-02 ENCOUNTER — Inpatient Hospital Stay: Payer: PPO

## 2019-11-02 ENCOUNTER — Inpatient Hospital Stay (HOSPITAL_BASED_OUTPATIENT_CLINIC_OR_DEPARTMENT_OTHER): Payer: PPO | Admitting: Oncology

## 2019-11-02 ENCOUNTER — Other Ambulatory Visit: Payer: Self-pay

## 2019-11-02 VITALS — BP 128/77 | HR 85 | Temp 97.7°F | Resp 18 | Wt 188.8 lb

## 2019-11-02 DIAGNOSIS — Z95828 Presence of other vascular implants and grafts: Secondary | ICD-10-CM

## 2019-11-02 DIAGNOSIS — C652 Malignant neoplasm of left renal pelvis: Secondary | ICD-10-CM | POA: Diagnosis not present

## 2019-11-02 DIAGNOSIS — C679 Malignant neoplasm of bladder, unspecified: Secondary | ICD-10-CM

## 2019-11-02 DIAGNOSIS — Z5111 Encounter for antineoplastic chemotherapy: Secondary | ICD-10-CM | POA: Diagnosis not present

## 2019-11-02 LAB — CMP (CANCER CENTER ONLY)
ALT: 13 U/L (ref 0–44)
AST: 15 U/L (ref 15–41)
Albumin: 3.2 g/dL — ABNORMAL LOW (ref 3.5–5.0)
Alkaline Phosphatase: 76 U/L (ref 38–126)
Anion gap: 8 (ref 5–15)
BUN: 23 mg/dL (ref 8–23)
CO2: 24 mmol/L (ref 22–32)
Calcium: 8.9 mg/dL (ref 8.9–10.3)
Chloride: 105 mmol/L (ref 98–111)
Creatinine: 1.04 mg/dL (ref 0.61–1.24)
GFR, Est AFR Am: >60 mL/min (ref >60)
GFR, Estimated: >60 mL/min (ref >60)
Glucose, Bld: 134 mg/dL — ABNORMAL HIGH (ref 70–99)
Potassium: 4.1 mmol/L (ref 3.5–5.1)
Sodium: 137 mmol/L (ref 135–145)
Total Bilirubin: 0.5 mg/dL (ref 0.3–1.2)
Total Protein: 7 g/dL (ref 6.5–8.1)

## 2019-11-02 LAB — CBC WITH DIFFERENTIAL (CANCER CENTER ONLY)
Abs Immature Granulocytes: 0.03 10*3/uL (ref 0.00–0.07)
Basophils Absolute: 0.1 10*3/uL (ref 0.0–0.1)
Basophils Relative: 2 %
Eosinophils Absolute: 0 10*3/uL (ref 0.0–0.5)
Eosinophils Relative: 0 %
HCT: 28 % — ABNORMAL LOW (ref 39.0–52.0)
Hemoglobin: 9.5 g/dL — ABNORMAL LOW (ref 13.0–17.0)
Immature Granulocytes: 1 %
Lymphocytes Relative: 35 %
Lymphs Abs: 1.2 10*3/uL (ref 0.7–4.0)
MCH: 27.4 pg (ref 26.0–34.0)
MCHC: 33.9 g/dL (ref 30.0–36.0)
MCV: 80.7 fL (ref 80.0–100.0)
Monocytes Absolute: 0.1 10*3/uL (ref 0.1–1.0)
Monocytes Relative: 3 %
Neutro Abs: 2 10*3/uL (ref 1.7–7.7)
Neutrophils Relative %: 59 %
Platelet Count: 333 10*3/uL (ref 150–400)
RBC: 3.47 MIL/uL — ABNORMAL LOW (ref 4.22–5.81)
RDW: 15.2 % (ref 11.5–15.5)
WBC Count: 3.3 10*3/uL — ABNORMAL LOW (ref 4.0–10.5)
nRBC: 0 % (ref 0.0–0.2)

## 2019-11-02 LAB — SAMPLE TO BLOOD BANK

## 2019-11-02 MED ORDER — SODIUM CHLORIDE 0.9% FLUSH
10.0000 mL | INTRAVENOUS | Status: DC | PRN
  Administered 2019-11-02: 10 mL
  Filled 2019-11-02: qty 10

## 2019-11-02 MED ORDER — PROCHLORPERAZINE MALEATE 10 MG PO TABS
10.0000 mg | ORAL_TABLET | Freq: Once | ORAL | Status: AC
  Administered 2019-11-02: 10 mg via ORAL

## 2019-11-02 MED ORDER — HEPARIN SOD (PORK) LOCK FLUSH 100 UNIT/ML IV SOLN
500.0000 [IU] | Freq: Once | INTRAVENOUS | Status: AC | PRN
  Administered 2019-11-02: 500 [IU]
  Filled 2019-11-02: qty 5

## 2019-11-02 MED ORDER — PROCHLORPERAZINE MALEATE 10 MG PO TABS
ORAL_TABLET | ORAL | Status: AC
  Filled 2019-11-02: qty 1

## 2019-11-02 MED ORDER — SODIUM CHLORIDE 0.9 % IV SOLN
1000.0000 mg/m2 | Freq: Once | INTRAVENOUS | Status: AC
  Administered 2019-11-02: 2128 mg via INTRAVENOUS
  Filled 2019-11-02: qty 55.97

## 2019-11-02 MED ORDER — SODIUM CHLORIDE 0.9% FLUSH
10.0000 mL | Freq: Once | INTRAVENOUS | Status: AC
  Administered 2019-11-02: 10 mL
  Filled 2019-11-02: qty 10

## 2019-11-02 MED ORDER — SODIUM CHLORIDE 0.9 % IV SOLN
Freq: Once | INTRAVENOUS | Status: AC
  Filled 2019-11-02: qty 250

## 2019-11-02 NOTE — Patient Instructions (Signed)
[facility] °Discharge Instructions for Patients Receiving Chemotherapy ° °Today you received the following chemotherapy agents Gemzar ° °To help prevent nausea and vomiting after your treatment, we encourage you to take your nausea medication as directed. °  °If you develop nausea and vomiting that is not controlled by your nausea medication, call the clinic.  ° °BELOW ARE SYMPTOMS THAT SHOULD BE REPORTED IMMEDIATELY: °· *FEVER GREATER THAN 100.5 F °· *CHILLS WITH OR WITHOUT FEVER °· NAUSEA AND VOMITING THAT IS NOT CONTROLLED WITH YOUR NAUSEA MEDICATION °· *UNUSUAL SHORTNESS OF BREATH °· *UNUSUAL BRUISING OR BLEEDING °· TENDERNESS IN MOUTH AND THROAT WITH OR WITHOUT PRESENCE OF ULCERS °· *URINARY PROBLEMS °· *BOWEL PROBLEMS °· UNUSUAL RASH °Items with * indicate a potential emergency and should be followed up as soon as possible. ° °Feel free to call the clinic should you have any questions or concerns. The clinic phone number is [phone]. ° °Please show the CHEMO ALERT CARD at check-in to the Emergency Department and triage nurse. ° ° °

## 2019-11-02 NOTE — Progress Notes (Signed)
Hematology and Oncology Follow Up Visit  Lawrence Hall [id number] [birth year] 74 y.o. 11/02/2019 11:55 AM Lawrence Hall, MDKoberlein, Lawrence Berg, MD   Principle Diagnosis: 74 year old with left renal pelvis high-grade urothelial carcinoma with a regional lymph node diagnosed in May 2021.   Prior Therapy: He is status post biopsy obtained on Aug 21, 2019 which showed high-grade urothelial carcinoma of the left renal pelvis.   Current therapy:  Chemotherapy utilizing gemcitabine and cisplatin given on day 1 and cisplatin given on day 8 start her on 10/06/2019.  He is here for cycle 2-day 8 of therapy.  Interim History: Mr. Lawrence Hall presents today for a repeat evaluation.  Since the last visit, he reports no major changes in his health.  He continues to tolerate chemotherapy without any major complaints.  He denies any vomiting or abdominal pain.  He does report some mild nausea that is manageable with Compazine.  He denies any hematuria or dysuria.  He denies any recent hospitalization or illnesses.     Medications: Unchanged on review. Current Outpatient Medications  Medication Sig Dispense Refill  . amLODipine (NORVASC) 2.5 MG tablet Take 1 tablet (2.5 mg total) by mouth daily. 30 tablet 6  . aspirin EC 81 MG tablet Take 81 mg by mouth daily.    [name] atorvastatin (LIPITOR) 80 MG tablet TAKE 1 TABLET ONCE DAILY AT 6 PM. (Patient taking differently: Take 80 mg by mouth daily at 6 PM. ) 90 tablet 2  . chlorhexidine (PERIDEX) 0.12 % solution Rinse with 15 mls twice daily for 30 seconds. Use after breakfast and at bedtime. Spit out excess. Do not swallow. (Patient taking differently: Use as directed 15 mLs in the mouth or throat daily. ) 960 mL 5  . Cholecalciferol (VITAMIN D-3) 125 MCG (5000 UT) TABS Take 5,000 Units by mouth daily.    . metoprolol succinate (TOPROL-XL) 100 MG 24 hr tablet TAKE 1 TABLET AT BEDTIME. TAKE WITH OR IMMEDIATELY FOLLOWING A MEAL. (Patient taking differently: Take 100 mg  by mouth daily. ) 90 tablet 0  . nitroGLYCERIN (NITROSTAT) 0.4 MG SL tablet 1 TAB UNDER TONGUE AS NEEDED FOR CHEST PAIN. MAY REPEAT EVERY 5 MIN FOR A TOTAL OF 3 DOSES. (Patient taking differently: Place 0.4 mg under the tongue every 5 (five) minutes as needed for chest pain. ) 25 tablet 4  . prochlorperazine (COMPAZINE) 10 MG tablet Take 10 mg by mouth every 6 (six) hours as needed for nausea or vomiting.    . traZODone (DESYREL) 50 MG tablet 1/2-1 tablet at bedtime (Patient taking differently: Take 50 mg by mouth at bedtime as needed for sleep. ) 30 tablet 0  . vitamin E 400 UNIT capsule Take 400 Units by mouth every evening.      No current facility-administered medications for this visit.     Allergies:  Allergies  Allergen Reactions  . Other Itching and Other (See Comments)    Pet dander, seasonal allergies = Runny nose, itchy eyes      Physical Exam: Blood pressure 128/77, pulse 85, temperature 97.7 F (36.5 C), temperature source Temporal, resp. rate 18, weight 188 lb 12.8 oz (85.6 kg), SpO2 100 %.    ECOG: 1      General appearance: Alert, awake without any distress. Head: Atraumatic without abnormalities Oropharynx: Without any thrush or ulcers. Eyes: No scleral icterus. Lymph nodes: No lymphadenopathy noted in the cervical, supraclavicular, or axillary nodes Heart:regular rate and rhythm, without any murmurs or gallops.   Lung:  Clear to auscultation without any rhonchi, wheezes or dullness to percussion. Abdomin: Soft, nontender without any shifting dullness or ascites. Musculoskeletal: No clubbing or cyanosis. Neurological: No motor or sensory deficits. Skin: No rashes or lesions.        Lab Results: Lab Results  Component Value Date   WBC 5.6 10/27/2019   HGB 9.3 (L) 10/27/2019   HCT 28.3 (L) 10/27/2019   MCV 82.7 10/27/2019   PLT 446 (H) 10/27/2019     Chemistry      Component Value Date/Time   NA 139 10/27/2019 0813   NA 137 04/07/2019 1021    K 3.9 10/27/2019 0813   CL 107 10/27/2019 0813   CO2 24 10/27/2019 0813   BUN 22 10/27/2019 0813   BUN 29 (H) 04/07/2019 1021   CREATININE 1.10 10/27/2019 0813      Component Value Date/Time   CALCIUM 8.9 10/27/2019 0813   ALKPHOS 74 10/27/2019 0813   AST 15 10/27/2019 0813   ALT 12 10/27/2019 0813   BILITOT 0.3 10/27/2019 0813       Impression and Plan:  74 year old man with:   1.    High-grade urothelial carcinoma of the left renal pelvis with regional adenopathy  diagnosed in May 2021.   He is currently receiving neoadjuvant chemotherapy utilizing cisplatin and gemcitabine and here for day 8 of cycle 2 of therapy.  Risks and benefits of continuing this therapy to complete 4 cycles were reviewed.  Potential complications include nausea, vomiting suppression reiterated.  He is agreeable to proceed at this time.  We could possibly decrease the cycles to 3 depending on his tolerance and any complications.   2. IV access: Port-A-Cath continues to be in use without any complications.   3. Antiemetics: No nausea or vomiting reported at this time.  Antiemetics are available to him.  4. Renal function surveillance:Creatinine clearance remains to within normal range at this time.  5. Goals of care:  His disease is potentially curable and aggressive measures are warranted at this time.  6.  Hematuria: Resolved at this time.  7.  Anemia: Related to malignancy and hematuria.  Hemoglobin stable does not require transfusion.  8. Follow-up: In 2 weeks to start cycle 3 of therapy.   30  minutes were dedicated to this visit.  The time was spent on reviewing his disease status, discussing treatment options and addressing complications noted to his cancer cancer therapy.     Lawrence Button, MD 7/15/202111:55 AM

## 2019-11-03 ENCOUNTER — Telehealth: Payer: Self-pay | Admitting: Oncology

## 2019-11-03 NOTE — Telephone Encounter (Signed)
Scheduled per 07/15 los, patient has been called and notified.

## 2019-11-17 ENCOUNTER — Other Ambulatory Visit: Payer: Self-pay

## 2019-11-17 ENCOUNTER — Inpatient Hospital Stay: Payer: PPO

## 2019-11-17 ENCOUNTER — Inpatient Hospital Stay: Payer: PPO | Admitting: Oncology

## 2019-11-17 VITALS — BP 140/87 | HR 80 | Temp 97.3°F | Resp 18 | Ht 72.0 in | Wt 195.1 lb

## 2019-11-17 DIAGNOSIS — Z95828 Presence of other vascular implants and grafts: Secondary | ICD-10-CM

## 2019-11-17 DIAGNOSIS — Z5111 Encounter for antineoplastic chemotherapy: Secondary | ICD-10-CM | POA: Diagnosis not present

## 2019-11-17 DIAGNOSIS — C652 Malignant neoplasm of left renal pelvis: Secondary | ICD-10-CM

## 2019-11-17 DIAGNOSIS — C679 Malignant neoplasm of bladder, unspecified: Secondary | ICD-10-CM

## 2019-11-17 LAB — CBC WITH DIFFERENTIAL (CANCER CENTER ONLY)
Abs Immature Granulocytes: 0.07 10*3/uL (ref 0.00–0.07)
Basophils Absolute: 0 10*3/uL (ref 0.0–0.1)
Basophils Relative: 1 %
Eosinophils Absolute: 0.1 10*3/uL (ref 0.0–0.5)
Eosinophils Relative: 2 %
HCT: 26.1 % — ABNORMAL LOW (ref 39.0–52.0)
Hemoglobin: 8.7 g/dL — ABNORMAL LOW (ref 13.0–17.0)
Immature Granulocytes: 2 %
Lymphocytes Relative: 31 %
Lymphs Abs: 1.2 10*3/uL (ref 0.7–4.0)
MCH: 28.3 pg (ref 26.0–34.0)
MCHC: 33.3 g/dL (ref 30.0–36.0)
MCV: 85 fL (ref 80.0–100.0)
Monocytes Absolute: 0.6 10*3/uL (ref 0.1–1.0)
Monocytes Relative: 14 %
Neutro Abs: 2 10*3/uL (ref 1.7–7.7)
Neutrophils Relative %: 50 %
Platelet Count: 297 10*3/uL (ref 150–400)
RBC: 3.07 MIL/uL — ABNORMAL LOW (ref 4.22–5.81)
RDW: 18.6 % — ABNORMAL HIGH (ref 11.5–15.5)
WBC Count: 3.9 10*3/uL — ABNORMAL LOW (ref 4.0–10.5)
nRBC: 0 % (ref 0.0–0.2)

## 2019-11-17 LAB — CMP (CANCER CENTER ONLY)
ALT: 11 U/L (ref 0–44)
AST: 14 U/L — ABNORMAL LOW (ref 15–41)
Albumin: 3.5 g/dL (ref 3.5–5.0)
Alkaline Phosphatase: 74 U/L (ref 38–126)
Anion gap: 9 (ref 5–15)
BUN: 19 mg/dL (ref 8–23)
CO2: 23 mmol/L (ref 22–32)
Calcium: 9 mg/dL (ref 8.9–10.3)
Chloride: 109 mmol/L (ref 98–111)
Creatinine: 1.21 mg/dL (ref 0.61–1.24)
GFR, Est AFR Am: >60 mL/min (ref >60)
GFR, Estimated: 58 mL/min — ABNORMAL LOW (ref >60)
Glucose, Bld: 146 mg/dL — ABNORMAL HIGH (ref 70–99)
Potassium: 3.7 mmol/L (ref 3.5–5.1)
Sodium: 141 mmol/L (ref 135–145)
Total Bilirubin: 0.3 mg/dL (ref 0.3–1.2)
Total Protein: 6.7 g/dL (ref 6.5–8.1)

## 2019-11-17 MED ORDER — PALONOSETRON HCL INJECTION 0.25 MG/5ML
INTRAVENOUS | Status: AC
  Filled 2019-11-17: qty 5

## 2019-11-17 MED ORDER — SODIUM CHLORIDE 0.9 % IV SOLN
10.0000 mg | Freq: Once | INTRAVENOUS | Status: AC
  Administered 2019-11-17: 10 mg via INTRAVENOUS
  Filled 2019-11-17: qty 10

## 2019-11-17 MED ORDER — PALONOSETRON HCL INJECTION 0.25 MG/5ML
0.2500 mg | Freq: Once | INTRAVENOUS | Status: AC
  Administered 2019-11-17: 0.25 mg via INTRAVENOUS

## 2019-11-17 MED ORDER — SODIUM CHLORIDE 0.9 % IV SOLN
150.0000 mg | Freq: Once | INTRAVENOUS | Status: AC
  Administered 2019-11-17: 150 mg via INTRAVENOUS
  Filled 2019-11-17: qty 150

## 2019-11-17 MED ORDER — SODIUM CHLORIDE 0.9 % IV SOLN
47.0000 mg/m2 | Freq: Once | INTRAVENOUS | Status: AC
  Administered 2019-11-17: 100 mg via INTRAVENOUS
  Filled 2019-11-17: qty 100

## 2019-11-17 MED ORDER — SODIUM CHLORIDE 0.9 % IV SOLN
Freq: Once | INTRAVENOUS | Status: AC
  Filled 2019-11-17: qty 250

## 2019-11-17 MED ORDER — SODIUM CHLORIDE 0.9 % IV SOLN
1000.0000 mg/m2 | Freq: Once | INTRAVENOUS | Status: AC
  Administered 2019-11-17: 2128 mg via INTRAVENOUS
  Filled 2019-11-17: qty 55.97

## 2019-11-17 MED ORDER — SODIUM CHLORIDE 0.9 % IV SOLN
Freq: Once | INTRAVENOUS | Status: AC
  Filled 2019-11-17: qty 10

## 2019-11-17 MED ORDER — SODIUM CHLORIDE 0.9% FLUSH
10.0000 mL | Freq: Once | INTRAVENOUS | Status: AC
  Administered 2019-11-17: 10 mL
  Filled 2019-11-17: qty 10

## 2019-11-17 MED ORDER — SODIUM CHLORIDE 0.9% FLUSH
10.0000 mL | INTRAVENOUS | Status: DC | PRN
  Administered 2019-11-17: 10 mL
  Filled 2019-11-17: qty 10

## 2019-11-17 MED ORDER — HEPARIN SOD (PORK) LOCK FLUSH 100 UNIT/ML IV SOLN
500.0000 [IU] | Freq: Once | INTRAVENOUS | Status: AC | PRN
  Administered 2019-11-17: 500 [IU]
  Filled 2019-11-17: qty 5

## 2019-11-17 NOTE — Progress Notes (Signed)
Hematology and Oncology Follow Up Visit  Lawrence Hall [id number] 10-19-1945 74 y.o. 11/17/2019 8:29 AM Lawrence Hall, MDKoberlein, Lawrence Berg, MD   Principle Diagnosis: 74 year old with high-grade urothelial carcinoma of the left renal pelvis diagnosed in May 2021. He was found to have locally advanced disease with regional adenopathy.   Prior Therapy: He is status post biopsy obtained on Aug 21, 2019 which showed high-grade urothelial carcinoma of the left renal pelvis.   Current therapy:  Chemotherapy utilizing gemcitabine and cisplatin given on day 1 and cisplatin given on day 8 start her on 10/06/2019.   He is here for day 1 of cycle 3 of therapy.  Interim History: Mr. Lawrence Hall returns today for a repeat follow-up visit. Since the last visit,     Medications: Reviewed without changes. Current Outpatient Medications  Medication Sig Dispense Refill  . amLODipine (NORVASC) 2.5 MG tablet Take 1 tablet (2.5 mg total) by mouth daily. 30 tablet 6  . aspirin EC 81 MG tablet Take 81 mg by mouth daily.    [name] atorvastatin (LIPITOR) 80 MG tablet TAKE 1 TABLET ONCE DAILY AT 6 PM. (Patient taking differently: Take 80 mg by mouth daily at 6 PM. ) 90 tablet 2  . chlorhexidine (PERIDEX) 0.12 % solution Rinse with 15 mls twice daily for 30 seconds. Use after breakfast and at bedtime. Spit out excess. Do not swallow. (Patient taking differently: Use as directed 15 mLs in the mouth or throat daily. ) 960 mL 5  . Cholecalciferol (VITAMIN D-3) 125 MCG (5000 UT) TABS Take 5,000 Units by mouth daily.    . metoprolol succinate (TOPROL-XL) 100 MG 24 hr tablet TAKE 1 TABLET AT BEDTIME. TAKE WITH OR IMMEDIATELY FOLLOWING A MEAL. (Patient taking differently: Take 100 mg by mouth daily. ) 90 tablet 0  . nitroGLYCERIN (NITROSTAT) 0.4 MG SL tablet 1 TAB UNDER TONGUE AS NEEDED FOR CHEST PAIN. MAY REPEAT EVERY 5 MIN FOR A TOTAL OF 3 DOSES. (Patient taking differently: Place 0.4 mg under the tongue every 5 (five)  minutes as needed for chest pain. ) 25 tablet 4  . prochlorperazine (COMPAZINE) 10 MG tablet Take 10 mg by mouth every 6 (six) hours as needed for nausea or vomiting.    . traZODone (DESYREL) 50 MG tablet 1/2-1 tablet at bedtime (Patient taking differently: Take 50 mg by mouth at bedtime as needed for sleep. ) 30 tablet 0  . vitamin E 400 UNIT capsule Take 400 Units by mouth every evening.      No current facility-administered medications for this visit.     Allergies:  Allergies  Allergen Reactions  . Other Itching and Other (See Comments)    Pet dander, seasonal allergies = Runny nose, itchy eyes      Physical Exam:    ECOG: 1     General appearance: Comfortable appearing without any discomfort Head: Normocephalic without any trauma Oropharynx: Mucous membranes are moist and pink without any thrush or ulcers. Eyes: Pupils are equal and round reactive to light. Lymph nodes: No cervical, supraclavicular, inguinal or axillary lymphadenopathy.   Heart:regular rate and rhythm.  S1 and S2 without leg edema. Lung: Clear without any rhonchi or wheezes.  No dullness to percussion. Abdomin: Soft, nontender, nondistended with good bowel sounds.  No hepatosplenomegaly. Musculoskeletal: No joint deformity or effusion.  Full range of motion noted. Neurological: No deficits noted on motor, sensory and deep tendon reflex exam. Skin: No petechial rash or dryness.  Appeared moist.  Lab Results: Lab Results  Component Value Date   WBC 3.3 (L) 11/02/2019   HGB 9.5 (L) 11/02/2019   HCT 28.0 (L) 11/02/2019   MCV 80.7 11/02/2019   PLT 333 11/02/2019     Chemistry      Component Value Date/Time   NA 137 11/02/2019 1149   NA 137 04/07/2019 1021   K 4.1 11/02/2019 1149   CL 105 11/02/2019 1149   CO2 24 11/02/2019 1149   BUN 23 11/02/2019 1149   BUN 29 (H) 04/07/2019 1021   CREATININE 1.04 11/02/2019 1149      Component Value Date/Time   CALCIUM 8.9 11/02/2019 1149    ALKPHOS 76 11/02/2019 1149   AST 15 11/02/2019 1149   ALT 13 11/02/2019 1149   BILITOT 0.5 11/02/2019 1149       Impression and Plan:  74 year old man with:   1.   Left renal pelvis tumor with high-grade urothelial carcinoma and regional lymph node involvement diagnosed in May 2021.  He continues to receive any adjuvant chemotherapy without any major complications. The plan is to proceed with cycle 3 of therapy and complete staging evaluation after that. He has no evidence of disease outside of his GU tract, surgical resection would be planned in September 2021.   2. IV access: Port-A-Cath remains in place and will be used for chemotherapy.   3. Antiemetics: Antiemetics are available to him without any nausea or vomiting.  4. Renal function surveillance:Kidney function remains close to normal range without any changes at this time.  5. Goals of care:  Aggressive therapy remains indicated at this time and his disease is potentially curative.  6.  Hematuria: Resolved at this time.  7.  Anemia: Related to malignancy and chemotherapy.  Hemoglobin continues to be stable.  8.  Pancreatic mass: He will have abdominal imaging after this cycle to assess for any potential malignancy.  9. Follow-up: He will return in 1 week for the completion of cycle 3 and in 3 weeks for the beginning of cycle 4.   30  minutes were spent on this encounter.  The time was dedicated to updating his disease status, discussing treatment options and addressing complications related to therapy.     Lawrence Button, MD 7/30/20218:29 AM

## 2019-11-17 NOTE — Patient Instructions (Signed)
[facility] Discharge Instructions for Patients Receiving Chemotherapy  Today you received the following chemotherapy agents Gemzar and Cisplatin  To help prevent nausea and vomiting after your treatment, we encourage you to take your nausea medication as directed   If you develop nausea and vomiting that is not controlled by your nausea medication, call the clinic.   BELOW ARE SYMPTOMS THAT SHOULD BE REPORTED IMMEDIATELY:  *FEVER GREATER THAN 100.5 F  *CHILLS WITH OR WITHOUT FEVER  NAUSEA AND VOMITING THAT IS NOT CONTROLLED WITH YOUR NAUSEA MEDICATION  *UNUSUAL SHORTNESS OF BREATH  *UNUSUAL BRUISING OR BLEEDING  TENDERNESS IN MOUTH AND THROAT WITH OR WITHOUT PRESENCE OF ULCERS  *URINARY PROBLEMS  *BOWEL PROBLEMS  UNUSUAL RASH Items with * indicate a potential emergency and should be followed up as soon as possible.  Feel free to call the clinic should you have any questions or concerns. The clinic phone number is [phone].  Please show the CHEMO ALERT CARD at check-in to the Emergency Department and triage nurse.   

## 2019-11-17 NOTE — Patient Instructions (Signed)

## 2019-11-22 ENCOUNTER — Telehealth: Payer: Self-pay | Admitting: Cardiovascular Disease

## 2019-11-22 DIAGNOSIS — R918 Other nonspecific abnormal finding of lung field: Secondary | ICD-10-CM | POA: Diagnosis not present

## 2019-11-22 DIAGNOSIS — C652 Malignant neoplasm of left renal pelvis: Secondary | ICD-10-CM | POA: Diagnosis not present

## 2019-11-22 NOTE — Telephone Encounter (Signed)
Patient is calling to return Lawrence Hall's call, please call back.

## 2019-11-22 NOTE — Telephone Encounter (Signed)
Patient is calling to see what Dr. Sallyanne Kuster thinks regarding him losing circulation in his right hand. He states it's like when your hand goes to sleep, and there is a tingly sensation when he is not moving his hand, he said it does not go completely numb to where he can't feel it. He said he was told by a nurse that this could be a symptom from his chemo for his kidney cancer, that maybe he isn't moving around enough. He advised he doesn't think it is serious, just wanting some advise.

## 2019-11-22 NOTE — Telephone Encounter (Signed)
Left message to call back  

## 2019-11-24 ENCOUNTER — Other Ambulatory Visit: Payer: Self-pay

## 2019-11-24 ENCOUNTER — Inpatient Hospital Stay: Payer: PPO | Attending: Oncology

## 2019-11-24 ENCOUNTER — Inpatient Hospital Stay: Payer: PPO

## 2019-11-24 VITALS — BP 119/69 | HR 61 | Temp 98.1°F | Resp 18 | Wt 189.0 lb

## 2019-11-24 DIAGNOSIS — C679 Malignant neoplasm of bladder, unspecified: Secondary | ICD-10-CM

## 2019-11-24 DIAGNOSIS — C652 Malignant neoplasm of left renal pelvis: Secondary | ICD-10-CM

## 2019-11-24 DIAGNOSIS — Z5111 Encounter for antineoplastic chemotherapy: Secondary | ICD-10-CM | POA: Diagnosis not present

## 2019-11-24 DIAGNOSIS — Z95828 Presence of other vascular implants and grafts: Secondary | ICD-10-CM

## 2019-11-24 LAB — CBC WITH DIFFERENTIAL (CANCER CENTER ONLY)
Abs Immature Granulocytes: 0.14 10*3/uL — ABNORMAL HIGH (ref 0.00–0.07)
Basophils Absolute: 0 10*3/uL (ref 0.0–0.1)
Basophils Relative: 1 %
Eosinophils Absolute: 0 10*3/uL (ref 0.0–0.5)
Eosinophils Relative: 0 %
HCT: 24.6 % — ABNORMAL LOW (ref 39.0–52.0)
Hemoglobin: 8.2 g/dL — ABNORMAL LOW (ref 13.0–17.0)
Immature Granulocytes: 5 %
Lymphocytes Relative: 35 %
Lymphs Abs: 1 10*3/uL (ref 0.7–4.0)
MCH: 27.8 pg (ref 26.0–34.0)
MCHC: 33.3 g/dL (ref 30.0–36.0)
MCV: 83.4 fL (ref 80.0–100.0)
Monocytes Absolute: 0.2 10*3/uL (ref 0.1–1.0)
Monocytes Relative: 8 %
Neutro Abs: 1.5 10*3/uL — ABNORMAL LOW (ref 1.7–7.7)
Neutrophils Relative %: 51 %
Platelet Count: 220 10*3/uL (ref 150–400)
RBC: 2.95 MIL/uL — ABNORMAL LOW (ref 4.22–5.81)
RDW: 18.6 % — ABNORMAL HIGH (ref 11.5–15.5)
WBC Count: 2.9 10*3/uL — ABNORMAL LOW (ref 4.0–10.5)
nRBC: 0 % (ref 0.0–0.2)

## 2019-11-24 LAB — CMP (CANCER CENTER ONLY)
ALT: 10 U/L (ref 0–44)
AST: 14 U/L — ABNORMAL LOW (ref 15–41)
Albumin: 3.5 g/dL (ref 3.5–5.0)
Alkaline Phosphatase: 70 U/L (ref 38–126)
Anion gap: 7 (ref 5–15)
BUN: 25 mg/dL — ABNORMAL HIGH (ref 8–23)
CO2: 25 mmol/L (ref 22–32)
Calcium: 9.1 mg/dL (ref 8.9–10.3)
Chloride: 107 mmol/L (ref 98–111)
Creatinine: 1.06 mg/dL (ref 0.61–1.24)
GFR, Est AFR Am: >60 mL/min (ref >60)
GFR, Estimated: >60 mL/min (ref >60)
Glucose, Bld: 117 mg/dL — ABNORMAL HIGH (ref 70–99)
Potassium: 4.1 mmol/L (ref 3.5–5.1)
Sodium: 139 mmol/L (ref 135–145)
Total Bilirubin: 0.4 mg/dL (ref 0.3–1.2)
Total Protein: 6.6 g/dL (ref 6.5–8.1)

## 2019-11-24 MED ORDER — HEPARIN SOD (PORK) LOCK FLUSH 100 UNIT/ML IV SOLN
500.0000 [IU] | Freq: Once | INTRAVENOUS | Status: AC | PRN
  Administered 2019-11-24: 500 [IU]
  Filled 2019-11-24: qty 5

## 2019-11-24 MED ORDER — PROCHLORPERAZINE MALEATE 10 MG PO TABS
ORAL_TABLET | ORAL | Status: AC
  Filled 2019-11-24: qty 1

## 2019-11-24 MED ORDER — SODIUM CHLORIDE 0.9 % IV SOLN
Freq: Once | INTRAVENOUS | Status: AC
  Filled 2019-11-24: qty 250

## 2019-11-24 MED ORDER — PROCHLORPERAZINE MALEATE 10 MG PO TABS
10.0000 mg | ORAL_TABLET | Freq: Once | ORAL | Status: AC
  Administered 2019-11-24: 10 mg via ORAL

## 2019-11-24 MED ORDER — SODIUM CHLORIDE 0.9 % IV SOLN
1000.0000 mg/m2 | Freq: Once | INTRAVENOUS | Status: AC
  Administered 2019-11-24: 2128 mg via INTRAVENOUS
  Filled 2019-11-24: qty 55.97

## 2019-11-24 MED ORDER — SODIUM CHLORIDE 0.9% FLUSH
10.0000 mL | Freq: Once | INTRAVENOUS | Status: AC
  Administered 2019-11-24: 10 mL
  Filled 2019-11-24: qty 10

## 2019-11-24 MED ORDER — SODIUM CHLORIDE 0.9% FLUSH
10.0000 mL | INTRAVENOUS | Status: DC | PRN
  Administered 2019-11-24: 10 mL
  Filled 2019-11-24: qty 10

## 2019-11-24 NOTE — Patient Instructions (Signed)
[facility] °Discharge Instructions for Patients Receiving Chemotherapy ° °Today you received the following chemotherapy agents Gemzar ° °To help prevent nausea and vomiting after your treatment, we encourage you to take your nausea medication as directed. °  °If you develop nausea and vomiting that is not controlled by your nausea medication, call the clinic.  ° °BELOW ARE SYMPTOMS THAT SHOULD BE REPORTED IMMEDIATELY: °· *FEVER GREATER THAN 100.5 F °· *CHILLS WITH OR WITHOUT FEVER °· NAUSEA AND VOMITING THAT IS NOT CONTROLLED WITH YOUR NAUSEA MEDICATION °· *UNUSUAL SHORTNESS OF BREATH °· *UNUSUAL BRUISING OR BLEEDING °· TENDERNESS IN MOUTH AND THROAT WITH OR WITHOUT PRESENCE OF ULCERS °· *URINARY PROBLEMS °· *BOWEL PROBLEMS °· UNUSUAL RASH °Items with * indicate a potential emergency and should be followed up as soon as possible. ° °Feel free to call the clinic should you have any questions or concerns. The clinic phone number is [phone]. ° °Please show the CHEMO ALERT CARD at check-in to the Emergency Department and triage nurse. ° ° °

## 2019-11-27 ENCOUNTER — Ambulatory Visit
Admission: RE | Admit: 2019-11-27 | Discharge: 2019-11-27 | Disposition: A | Discharge status: Home / Self Care | Payer: PPO | Source: Ambulatory Visit | Attending: Urology | Admitting: Urology

## 2019-11-27 ENCOUNTER — Other Ambulatory Visit: Payer: Self-pay

## 2019-11-27 DIAGNOSIS — C652 Malignant neoplasm of left renal pelvis: Secondary | ICD-10-CM

## 2019-11-27 DIAGNOSIS — C642 Malignant neoplasm of left kidney, except renal pelvis: Secondary | ICD-10-CM | POA: Diagnosis not present

## 2019-11-27 MED ORDER — GADOBENATE DIMEGLUMINE 529 MG/ML IV SOLN
17.0000 mL | Freq: Once | INTRAVENOUS | Status: AC | PRN
  Administered 2019-11-27: 17 mL via INTRAVENOUS

## 2019-11-28 NOTE — Telephone Encounter (Signed)
Follow Up:     Pt wanted you to know his problem had been resolved. He said he sent a message via My-Chart and it was taken care of.

## 2019-11-28 NOTE — Telephone Encounter (Signed)
Noted! Thank you

## 2019-11-28 NOTE — Telephone Encounter (Signed)
Left message for pt to call.

## 2019-11-30 ENCOUNTER — Other Ambulatory Visit (HOSPITAL_COMMUNITY): Payer: PPO

## 2019-12-04 ENCOUNTER — Encounter: Payer: Self-pay | Admitting: Family Medicine

## 2019-12-05 ENCOUNTER — Telehealth: Payer: Self-pay | Admitting: Oncology

## 2019-12-05 ENCOUNTER — Telehealth: Payer: Self-pay | Admitting: Cardiovascular Disease

## 2019-12-05 DIAGNOSIS — C652 Malignant neoplasm of left renal pelvis: Secondary | ICD-10-CM | POA: Diagnosis not present

## 2019-12-05 NOTE — Telephone Encounter (Signed)
Dr. Sallyanne Kuster to see pt for surgical clearance 8/25. Note removed from preop pool. Richardson Dopp, PA-C    12/05/2019 5:04 PM

## 2019-12-05 NOTE — Telephone Encounter (Signed)
Dr. Sallyanne Kuster Pt has appt with you 12/13/19. Reviewed last note from [name], [place] in 09/2019.  Plan was to follow up with you prior to surgery - hence the appt in 11/2019. Please forward your note/recommendations regarding surgery to requesting surgeon. Richardson Dopp, PA-C    12/05/2019 5:02 PM

## 2019-12-05 NOTE — Telephone Encounter (Signed)
Scheduled apt per 8/17 sch msg - pt is aware of appt date and time . Very my chart active.

## 2019-12-05 NOTE — Telephone Encounter (Signed)
   [facility] Pre-operative Risk Assessment    Request for surgical clearance:  1. What type of surgery is being performed? Left Robotic Assisted Laparoscopic Nephrectomy   2. When is this surgery scheduled? 01/04/20   3. What type of clearance is required (medical clearance vs. Pharmacy clearance to hold med vs. Both)? Both  4. Are there any medications that need to be held prior to surgery and how long? aspirin EC 81 MG tablet - 5 days prior to procedure   5. Practice name and name of physician performing surgery?  [facility]  Dr. Raynelle Bring  6. What is your office phone number? [phone] (ext: [phone])   7.   What is your office fax number? [phone]  8.   Anesthesia type (None, local, MAC, general) ? General   Zara Council 12/05/2019, 4:30 PM  _________________________________________________________________   (provider comments below)

## 2019-12-08 ENCOUNTER — Inpatient Hospital Stay: Payer: PPO | Admitting: Oncology

## 2019-12-08 ENCOUNTER — Inpatient Hospital Stay: Payer: PPO

## 2019-12-12 ENCOUNTER — Ambulatory Visit: Payer: Self-pay

## 2019-12-13 ENCOUNTER — Other Ambulatory Visit: Payer: Self-pay

## 2019-12-13 ENCOUNTER — Ambulatory Visit (INDEPENDENT_AMBULATORY_CARE_PROVIDER_SITE_OTHER): Payer: PPO | Admitting: Cardiovascular Disease

## 2019-12-13 ENCOUNTER — Encounter: Payer: Self-pay | Admitting: Cardiovascular Disease

## 2019-12-13 VITALS — BP 111/59 | HR 69 | Ht 72.0 in | Wt 194.6 lb

## 2019-12-13 DIAGNOSIS — Z953 Presence of xenogenic heart valve: Secondary | ICD-10-CM

## 2019-12-13 DIAGNOSIS — I10 Essential (primary) hypertension: Secondary | ICD-10-CM

## 2019-12-13 DIAGNOSIS — Z01810 Encounter for preprocedural cardiovascular examination: Secondary | ICD-10-CM

## 2019-12-13 DIAGNOSIS — E7800 Pure hypercholesterolemia, unspecified: Secondary | ICD-10-CM

## 2019-12-13 DIAGNOSIS — I447 Left bundle-branch block, unspecified: Secondary | ICD-10-CM | POA: Diagnosis not present

## 2019-12-13 DIAGNOSIS — I2510 Atherosclerotic heart disease of native coronary artery without angina pectoris: Secondary | ICD-10-CM

## 2019-12-13 NOTE — Patient Instructions (Signed)
Medication Instructions:  No changes *If you need a refill on your cardiac medications before your next appointment, please call your pharmacy*   Lab Work: Your provider would like for you to have the following labs: Fasting Lipid on 12/19/19  If you have labs (blood work) drawn today and your tests are completely normal, you will receive your results only by:  [place] (if you have MyChart) OR  A paper copy in the mail If you have any lab test that is abnormal or we need to change your treatment, we will call you to review the results.   Testing/Procedures: None ordered   Follow-Up: At [facility], you and your health needs are our priority.  As part of our continuing mission to provide you with exceptional heart care, we have created designated Provider Care Teams.  These Care Teams include your primary Cardiologist (physician) and Advanced Practice Providers (APPs -  Physician Assistants and Nurse Practitioners) who all work together to provide you with the care you need, when you need it.  We recommend signing up for the patient portal called "MyChart".  Sign up information is provided on this After Visit Summary.  MyChart is used to connect with patients for Virtual Visits (Telemedicine).  Patients are able to view lab/test results, encounter notes, upcoming appointments, etc.  Non-urgent messages can be sent to your provider as well.   To learn more about what you can do with MyChart, go to [email or web address].    Your next appointment:   Keep your follow up on 04/15/20 at 3:40 with Dr. Sallyanne Kuster.

## 2019-12-13 NOTE — Progress Notes (Signed)
Patient ID: Lawrence Hall, male   DOB: 1946-01-30, 74 y.o.   MRN: [id number] Patient ID: Lawrence Hall, male   DOB: 06/10/1945, 74 y.o.   MRN: [id number]    Cardiology Office Note    Date:  12/15/2019   ID:  Sostenes, Kauffmann 1946-01-22, MRN [id number]  PCP:  Lawrence Macadam, MD  Cardiologist:   Lawrence Klein, MD   No chief complaint on file.   History of Present Illness:  Lawrence Hall is a 74 y.o. male with hyperlipidemia, hypertension, coronary artery disease, history of non-ST segment elevation myocardial infarction in 2017 due to a high-grade stenosis in the (proximal left circumflex coronary drug-eluting stent, 3.534 Resolute), returning with exertional angina/dyspnea and hypotensive response to stress testing in January 2020, found to have left main and proximal right coronary stenoses.  He therefore underwent CABG x3 (LIMA to LAD, SVG to OM, SVG to RCA) and aortic valve replacement with a 25 mm Edwards pericardial valve by Dr. Prescott Hall on April 29, 2018.  Postoperative course was complicated by atrial fibrillation and development of a left bundle branch block, superficial vein irritation probably from amiodarone.  [name] has been receiving chemotherapy and is scheduled to undergo robotic nephrectomy (Dr. Alinda Money) on September 16 for what is expected to be renal cell carcinoma.  He is likely to not undergo with the fourth cycle of chemotherapy since he has been relatively neutropenic.  From a cardiovascular point of view he is doing very well and is asymptomatic.  He denies angina or dyspnea at rest or with activity.  He has not had orthopnea, PND, leg edema, palpitations, dizziness, syncope, claudication, focal neurological complaints or any further bleeding problems.  Echocardiogram performed February 21, 2019 shows excellent aortic valve prosthesis parameters (peak gradient 16, mean gradient 10, dimensionless index 0.6) and normal left ventricular systolic function.  Past Medical  History:  Diagnosis Date  . Arthritis    mild hands  . Asthma    pt denies 4/21   . Chest pain 07/25/2012  . Coronary artery disease   . Dysrhythmia    PAC'S, hx of atrial fib after OHS 04/2018  . ED (erectile dysfunction)   . H/O hiatal hernia   . Heart murmur   . Hypercholesterolemia   . Hypertension    MARGINALLY HIGH - NO MEDS -MONITORS  . Myocardial infarction ([place])    2017   . Pneumonia    hx of 2018   . PONV (postoperative nausea and vomiting)   . Prostate cancer ([place]) 03/27/2011   prostate bx=adenocarcinoma,  . Skin cancer 2008   basal cell face /removed  . Sleep apnea    borderline sleep apnea per patient no devices     Past Surgical History:  Procedure Laterality Date  . AORTIC VALVE REPLACEMENT N/A 04/29/2018   Procedure: AORTIC VALVE REPLACEMENT (AVR) using INSPIRIS RESILIA  AORTIC VALVE 15mm;  Surgeon: Lawrence Poot, MD;  Location: [place];  Service: Open Heart Surgery;  Laterality: N/A;  . basal cell cancer  2008   inside nose  . CARDIAC CATHETERIZATION N/A 06/03/2015   Procedure: Left Heart Cath and Coronary Angiography;  Surgeon: Lawrence Sine, MD;  Location: [place] [facility];  Service: Cardiovascular;  Laterality: N/A;  . CATARACT EXTRACTION, BILATERAL  2007  . CORONARY ARTERY BYPASS GRAFT N/A 04/29/2018   Procedure: CORONARY ARTERY BYPASS GRAFTING (CABG) X 3; Using Left Internal Mammary Artery (LIMA) and Right Leg Greater Saphenous Vein Graft (SVG);  LIMA to LAD, SVG to OM1, SVG to RCA,;  Surgeon: Lawrence Poot, MD;  Location: [place];  Service: Open Heart Surgery;  Laterality: N/A;  . CYSTOSCOPY/RETROGRADE/URETEROSCOPY Left 08/21/2019   Procedure: CYSTOSCOPY/RETROGRADE/URETEROSCOPY/  LEFT BIOPSY/BRUSH BIOPSY/RENAL WASHINGS/ AND STENT PLACEMENT;  Surgeon: Lawrence Bring, MD;  Location: [facility];  Service: Urology;  Laterality: Left;  . IR IMAGING GUIDED PORT INSERTION  09/28/2019  . RIGHT/LEFT HEART CATH AND CORONARY ANGIOGRAPHY N/A 04/26/2018   Procedure:  RIGHT/LEFT HEART CATH AND CORONARY ANGIOGRAPHY;  Surgeon: Lawrence Sine, MD;  Location: [place] [facility];  Service: Cardiovascular;  Laterality: N/A;  . ROBOT ASSISTED LAPAROSCOPIC RADICAL PROSTATECTOMY  08/10/2011   Procedure: ROBOTIC ASSISTED LAPAROSCOPIC RADICAL PROSTATECTOMY LEVEL 2;  Surgeon: Lawrence Gray, MD;  Location: [facility];  Service: Urology;  Laterality: N/A;  . TEE WITHOUT CARDIOVERSION N/A 04/29/2018   Procedure: TRANSESOPHAGEAL ECHOCARDIOGRAM (TEE);  Surgeon: Lawrence Hall, Lawrence Salina, MD;  Location: [place];  Service: Open Heart Surgery;  Laterality: N/A;  . THYROID SURGERY  infant   uncertain details; no thyroid replacement listed    Current Medications: Outpatient Medications Prior to Visit  Medication Sig Dispense Refill  . amLODipine (NORVASC) 2.5 MG tablet Take 1 tablet (2.5 mg total) by mouth daily. 30 tablet 6  . aspirin EC 81 MG tablet Take 81 mg by mouth daily.    Lawrence Hall atorvastatin (LIPITOR) 80 MG tablet TAKE 1 TABLET ONCE DAILY AT 6 PM. (Patient taking differently: Take 80 mg by mouth daily at 6 PM. ) 90 tablet 2  . chlorhexidine (PERIDEX) 0.12 % solution Rinse with 15 mls twice daily for 30 seconds. Use after breakfast and at bedtime. Spit out excess. Do not swallow. (Patient taking differently: Use as directed 15 mLs in the mouth or throat daily. ) 960 mL 5  . Cholecalciferol (VITAMIN D-3) 125 MCG (5000 UT) TABS Take 5,000 Units by mouth daily.    . metoprolol succinate (TOPROL-XL) 100 MG 24 hr tablet TAKE 1 TABLET AT BEDTIME. TAKE WITH OR IMMEDIATELY FOLLOWING A MEAL. (Patient taking differently: Take 100 mg by mouth daily. ) 90 tablet 0  . nitroGLYCERIN (NITROSTAT) 0.4 MG SL tablet 1 TAB UNDER TONGUE AS NEEDED FOR CHEST PAIN. MAY REPEAT EVERY 5 MIN FOR A TOTAL OF 3 DOSES. (Patient taking differently: Place 0.4 mg under the tongue every 5 (five) minutes as needed for chest pain. ) 25 tablet 4  . traZODone (DESYREL) 50 MG tablet 1/2-1 tablet at bedtime (Patient taking differently:  Take 50 mg by mouth at bedtime as needed for sleep. ) 30 tablet 0  . vitamin E 400 UNIT capsule Take 400 Units by mouth every evening.     . prochlorperazine (COMPAZINE) 10 MG tablet Take 10 mg by mouth every 6 (six) hours as needed for nausea or vomiting.     No facility-administered medications prior to visit.     Allergies:   Other   Social History   Socioeconomic History  . Marital status: Married    Spouse name: [name]   . Number of children: 0  . Years of education: 15 years   . Highest education level: Not on file  Occupational History    Employer: [employer]    Comment: customer service [employer]  Tobacco Use  . Smoking status: Never Smoker  . Smokeless tobacco: Never Used  Vaping Use  . Vaping Use: Never used  Substance and Sexual Activity  . Alcohol use: Not Currently  . Drug use: No  .  Sexual activity: Not on file  Other Topics Concern  . Not on file  Social History Narrative  . Not on file   Social Determinants of Health   Financial Resource Strain:   . Difficulty of Paying Living Expenses: Not on file  Food Insecurity:   . Worried About Charity fundraiser in the Last Year: Not on file  . Ran Out of Food in the Last Year: Not on file  Transportation Needs:   . Lack of Transportation (Medical): Not on file  . Lack of Transportation (Non-Medical): Not on file  Physical Activity:   . Days of Exercise per Week: Not on file  . Minutes of Exercise per Session: Not on file  Stress:   . Feeling of Stress : Not on file  Social Connections:   . Frequency of Communication with Friends and Family: Not on file  . Frequency of Social Gatherings with Friends and Family: Not on file  . Attends Religious Services: Not on file  . Active Member of Clubs or Organizations: Not on file  . Attends Archivist Meetings: Not on file  . Marital Status: Not on file     Family History:  The patient's family history includes Alcohol abuse in his father; Breast  cancer in his sister; Breast cancer (age of onset: 46) in his mother; CAD in his brother; Cervical cancer in his sister and sister; Heart attack (age of onset: 65) in his father; Liver disease in his father; Prostate cancer (age of onset: 50) in his father.   ROS:   Please see the history of present illness.    All other systems are reviewed and are negative.  PHYSICAL EXAM:   VS:  BP (!) 111/59   Pulse 69   Ht 6' (1.829 m)   Wt 194 lb 9.6 oz (88.3 kg)   SpO2 96%   BMI 26.39 kg/m      General: Alert, oriented x3, no distress, appears well.  May be mildly pale. Head: no evidence of trauma, PERRL, EOMI, no exophtalmos or lid lag, no myxedema, no xanthelasma; normal ears, nose and oropharynx Neck: normal jugular venous pulsations and no hepatojugular reflux; brisk carotid pulses without delay and no carotid bruits Chest: clear to auscultation, no signs of consolidation by percussion or palpation, normal fremitus, symmetrical and full respiratory excursions Cardiovascular: normal position and quality of the apical impulse, regular rhythm, normal first and second heart sounds, no systolic murmurs, rubs or gallops.  Has a barely audible early peaking systolic ejection murmur aortic focus. Abdomen: no tenderness or distention, no masses by palpation, no abnormal pulsatility or arterial bruits, normal bowel sounds, no hepatosplenomegaly Extremities: no clubbing, cyanosis or edema; 2+ radial, ulnar and brachial pulses bilaterally; 2+ right femoral, posterior tibial and dorsalis pedis pulses; 2+ left femoral, posterior tibial and dorsalis pedis pulses; no subclavian or femoral bruits Neurological: grossly nonfocal Psych: Normal mood and affect  Wt Readings from Last 3 Encounters:  12/13/19 194 lb 9.6 oz (88.3 kg)  11/24/19 189 lb (85.7 kg)  11/17/19 195 lb 1.6 oz (88.5 kg)      Studies/Labs Reviewed:   ECHO 02/21/2019:   1. Left ventricular ejection fraction, by visual estimation, is 55 to  60%. The left ventricle has normal function. There is mildly increased left ventricular hypertrophy.  2. Left ventricular diastolic parameters are consistent with Grade I diastolic dysfunction (impaired relaxation).  3. Global right ventricle has normal systolic function.The right ventricular size is normal. No  increase in right ventricular wall thickness.  4. Left atrial size was normal.  5. Right atrial size was normal.  6. The mitral valve is normal in structure. Trace mitral valve regurgitation. No evidence of mitral stenosis.  7. The tricuspid valve is normal in structure. Tricuspid valve regurgitation is mild.  8. The aortic valve is normal in structure. Aortic valve regurgitation is not visualized. No evidence of aortic valve sclerosis or stenosis.  9. The pulmonic valve was normal in structure. Pulmonic valve regurgitation is not visualized. 10. Mildly elevated pulmonary artery systolic pressure. 11. The inferior vena cava is normal in size with greater than 50% respiratory variability, suggesting right atrial pressure of 3 mmHg.  EKG:  EKG is  ordered today.  It shows normal sinus rhythm with first-degree AV block (PR 274 ms) and pre-existing nonspecific intraventricular conduction delay (QRS 132 ms) with poor R wave progression.  It is really not changed from his previous tracings.  BMET    Component Value Date/Time   NA 139 11/24/2019 1003   NA 137 04/07/2019 1021   K 4.1 11/24/2019 1003   CL 107 11/24/2019 1003   CO2 25 11/24/2019 1003   GLUCOSE 117 (H) 11/24/2019 1003   BUN 25 (H) 11/24/2019 1003   BUN 29 (H) 04/07/2019 1021   CREATININE 1.06 11/24/2019 1003   CALCIUM 9.1 11/24/2019 1003   GFRNONAA >60 11/24/2019 1003   GFRAA >60 11/24/2019 1003    2018  triglycerides 120, total cholesterol 113, LDL 47, HDL 42. 2019 LDL cholesterol 69 Lipid Panel     Component Value Date/Time   CHOL 136 04/07/2019 1021   TRIG 127 04/07/2019 1021   HDL 42 04/07/2019 1021   CHOLHDL  3.2 04/07/2019 1021   CHOLHDL 5.0 06/01/2015 1202   VLDL 52 (H) 06/01/2015 1202   LDLCALC 71 04/07/2019 1021     ASSESSMENT:    1. Coronary artery disease involving native coronary artery of native heart without angina pectoris   2. History of aortic valve replacement with bioprosthetic valve   3. LBBB (left bundle branch block)   4. Essential hypertension   5. Hypercholesterolemia   6. Preoperative cardiovascular examination      PLAN:  In order of problems listed above:  1. [name]: Asymptomatic since bypass surgery.. 2. S/P AVR: Normal biological prosthesis function by clinical examination. 3. AFib: This only occurred postoperatively and he is no longer taking anticoagulants or amiodarone. 4. LBBB/1st deg AVB: Avoid higher doses of beta-blockers or other negative chronotropic agents.  No signs or symptoms of high-grade AV block. 5. HTN: Good control 6. HLP: Most recent lipid profile shows all lipid parameters in desirable range.  7. Preoperative cardiovascular examination: Brok is at low risk for major cardiovascular complications with the planned nephrectomy.  Even though he has extensive coronary and valvular heart disease, these have been addressed with surgery very recently.  He has excellent functional status.  Aspirin can be stopped for 5-7 days before the surgical procedure.  His metoprolol should not be interrupted around the time of surgery.  Medication Adjustments/Labs and Tests Ordered: Current medicines are reviewed at length with the patient today.  Concerns regarding medicines are outlined above.  Medication changes, Labs and Tests ordered today are listed in the Patient Instructions below. Patient Instructions  Medication Instructions:  No changes *If you need a refill on your cardiac medications before your next appointment, please call your pharmacy*   Lab Work: Your provider would like for  you to have the following labs: Fasting Lipid on 12/19/19  If  you have labs (blood work) drawn today and your tests are completely normal, you will receive your results only by: Lawrence Hall MyChart Message (if you have MyChart) OR . A paper copy in the mail If you have any lab test that is abnormal or we need to change your treatment, we will call you to review the results.   Testing/Procedures: None ordered   Follow-Up: At [facility], you and your health needs are our priority.  As part of our continuing mission to provide you with exceptional heart care, we have created designated Provider Care Teams.  These Care Teams include your primary Cardiologist (physician) and Advanced Practice Providers (APPs -  Physician Assistants and Nurse Practitioners) who all work together to provide you with the care you need, when you need it.  We recommend signing up for the patient portal called "MyChart".  Sign up information is provided on this After Visit Summary.  MyChart is used to connect with patients for Virtual Visits (Telemedicine).  Patients are able to view lab/test results, encounter notes, upcoming appointments, etc.  Non-urgent messages can be sent to your provider as well.   To learn more about what you can do with MyChart, go to [email or web address].    Your next appointment:   Keep your follow up on 04/15/20 at 3:40 with Dr. Sallyanne Kuster.     Signed, Lawrence Klein, MD  12/15/2019 2:00 PM    [address], [place], [place]  [address] Phone: [phone]; Fax: [phone]

## 2019-12-15 ENCOUNTER — Other Ambulatory Visit: Payer: PPO

## 2019-12-15 ENCOUNTER — Ambulatory Visit: Payer: PPO

## 2019-12-19 ENCOUNTER — Ambulatory Visit: Payer: PPO | Attending: Critical Care Medicine

## 2019-12-19 DIAGNOSIS — Z23 Encounter for immunization: Secondary | ICD-10-CM

## 2019-12-19 NOTE — Progress Notes (Signed)
   Covid-19 Vaccination Clinic  Name:  Lawrence Hall    MRN: [id number] DOB: 08-01-1945  12/19/2019  Lawrence Hall was observed post Covid-19 immunization for 15 minutes without incident. He was provided with Vaccine Information Sheet and instruction to access the V-Safe system.   Lawrence Hall was instructed to call 911 with any severe reactions post vaccine: [name] Difficulty breathing  . Swelling of face and throat  . A fast heartbeat  . A bad rash all over body  . Dizziness and weakness

## 2019-12-27 ENCOUNTER — Ambulatory Visit: Payer: PPO | Admitting: Family Medicine

## 2019-12-28 NOTE — Patient Instructions (Signed)
DUE TO COVID-19 ONLY ONE VISITOR IS ALLOWED TO COME WITH YOU AND STAY IN THE WAITING ROOM ONLY DURING PRE OP AND PROCEDURE DAY OF SURGERY. THE 1 VISITOR  MAY VISIT WITH YOU AFTER SURGERY IN YOUR PRIVATE ROOM DURING VISITING HOURS ONLY!  YOU NEED TO HAVE A COVID 19 TEST ON_9/13______ @_______ , THIS TEST MUST BE DONE BEFORE SURGERY,  COVID TESTING SITE [address] [address], IT IS ON THE RIGHT GOING OUT [address] APPROXIMATELY  2 MINUTES PAST [employer] ON THE RIGHT. ONCE YOUR COVID TEST IS COMPLETED,  PLEASE BEGIN THE QUARANTINE INSTRUCTIONS AS OUTLINED IN YOUR HANDOUT.                Lawrence Hall   Your procedure is scheduled on: 01/04/20   Report to [facility] Main  Entrance   Report to Short Stay at 5:30 AM     Call this number if you have problems the morning of surgery [phone]    Remember: Do not eat food or drink liquids :After Midnight.   BRUSH YOUR TEETH MORNING OF SURGERY AND RINSE YOUR MOUTH OUT, NO CHEWING GUM CANDY OR MINTS.     Take these medicines the morning of surgery with A SIP OF WATER: Trazodone,                                 You may not have any metal on your body including              piercings  Do not wear jewelry,  lotions, powders or deodorant        .              Men may shave face and neck.   Do not bring valuables to the hospital. [name].  Contacts, dentures or bridgework may not be worn into surgery.                 Please read over the following fact sheets you were given: _____________________________________________________________________             [facility] - Preparing for Surgery  Before surgery, you can play an important role.   Because skin is not sterile, your skin needs to be as free of germs as possible.   You can reduce the number of germs on your skin by washing with CHG (chlorahexidine gluconate) soap before surgery.    CHG is an antiseptic cleaner which kills germs and bonds with the skin to continue killing germs even after washing. Please DO NOT use if you have an allergy to CHG or antibacterial soaps.   If your skin becomes reddened/irritated stop using the CHG and inform your nurse when you arrive at Short Stay. .  You may shave your face/neck.  Please follow these instructions carefully:  1.  Shower with CHG Soap the night before surgery and the  morning of Surgery.  2.  If you choose to wash your hair, wash your hair first as usual with your  normal  shampoo.  3.  After you shampoo, rinse your hair and body thoroughly to remove the  shampoo.  4.  Use CHG as you would any other liquid soap.  You can apply chg directly  to the skin and wash                       Gently with a scrungie or clean washcloth.  5.  Apply the CHG Soap to your body ONLY FROM THE NECK DOWN.   Do not use on face/ open                           Wound or open sores. Avoid contact with eyes, ears mouth and genitals (private parts).                       Wash face,  Genitals (private parts) with your normal soap.             6.  Wash thoroughly, paying special attention to the area where your surgery  will be performed.  7.  Thoroughly rinse your body with warm water from the neck down.  8.  DO NOT shower/wash with your normal soap after using and rinsing off  the CHG Soap.             9.  Pat yourself dry with a clean towel.            10.  Wear clean pajamas.            11.  Place clean sheets on your bed the night of your first shower and do not  sleep with pets. Day of Surgery : Do not apply any lotions/deodorants the morning of surgery.  Please wear clean clothes to the hospital/surgery center.  FAILURE TO FOLLOW THESE INSTRUCTIONS MAY RESULT IN THE CANCELLATION OF YOUR SURGERY PATIENT SIGNATURE_________________________________  NURSE  SIGNATURE__________________________________  ________________________________________________________________________   [place] Hall  An incentive spirometer is a tool that can help keep your lungs clear and active. This tool measures how well you are filling your lungs with each breath. Taking long deep breaths may help reverse or decrease the chance of developing breathing (pulmonary) problems (especially infection) following:  A long period of time when you are unable to move or be active. BEFORE THE PROCEDURE   If the spirometer includes an indicator to show your best effort, your nurse or respiratory therapist will set it to a desired goal.  If possible, sit up straight or lean slightly forward. Try not to slouch.  Hold the incentive spirometer in an upright position. INSTRUCTIONS FOR USE  1. Sit on the edge of your bed if possible, or sit up as far as you can in bed or on a chair. 2. Hold the incentive spirometer in an upright position. 3. Breathe out normally. 4. Place the mouthpiece in your mouth and seal your lips tightly around it. 5. Breathe in slowly and as deeply as possible, raising the piston or the ball toward the top of the column. 6. Hold your breath for 3-5 seconds or for as long as possible. Allow the piston or ball to fall to the bottom of the column. 7. Remove the mouthpiece from your mouth and breathe out normally. 8. Rest for a few seconds and repeat Steps 1 through 7 at least 10 times every 1-2 hours when you are awake. Take your time and take a few normal breaths between deep breaths. 9. The spirometer may include an indicator to show your best effort.  Use the indicator as a goal to work toward during each repetition. 10. After each set of 10 deep breaths, practice coughing to be sure your lungs are clear. If you have an incision (the cut made at the time of surgery), support your incision when coughing by placing a pillow or rolled up towels firmly  against it. Once you are able to get out of bed, walk around indoors and cough well. You may stop using the incentive spirometer when instructed by your caregiver.  RISKS AND COMPLICATIONS  Take your time so you do not get dizzy or light-headed.  If you are in pain, you may need to take or ask for pain medication before doing incentive spirometry. It is harder to take a deep breath if you are having pain. AFTER USE  Rest and breathe slowly and easily.  It can be helpful to keep track of a log of your progress. Your caregiver can provide you with a simple table to help with this. If you are using the spirometer at home, follow these instructions: [place] IF:   You are having difficultly using the spirometer.  You have trouble using the spirometer as often as instructed.  Your pain medication is not giving enough relief while using the spirometer.  You develop fever of 100.5 F (38.1 C) or higher. SEEK IMMEDIATE MEDICAL CARE IF:   You cough up bloody sputum that had not been present before.  You develop fever of 102 F (38.9 C) or greater.  You develop worsening pain at or near the incision site. MAKE SURE YOU:   Understand these instructions.  Will watch your condition.  Will get help right away if you are not doing well or get worse. Document Released: 08/17/2006 Document Revised: 06/29/2011 Document Reviewed: 10/18/2006 [facility] Patient Information 2014 [place], [place].   ________________________________________________________________________

## 2019-12-29 NOTE — Progress Notes (Addendum)
COVID Vaccine Completed: x3 Date COVID Vaccine completed:  06-11-19, 07-11-19 & 12-19-19 COVID vaccine manufacturer: [employer]   PCP - [name], MD Cardiologist - Mihai [name], MG  Cardiac Clearance in Epic dated 12-13-19  Chest x-ray -  EKG - 12-13-19 in Epic Stress Test -  ECHO - 02-21-19 in Epic Cardiac Cath - 04-26-18 in Epic Pacemaker/ICD device last checked:  Sleep Study - 04-12-13 in Epic CPAP - No  Fasting Blood Sugar -  Checks Blood Sugar _____ times a day  Blood Thinner Instructions: Aspirin Instructions:ASA 81 mg.  To stop 5 days prior to surgery Last Dose:  12/29/19  Anesthesia review:  Hx of aortic valve replacement, CABG x3, CAD, LBBB, Afib, aortic stenosis, Non-STEMI  Patient denies shortness of breath, fever, cough and chest pain at PAT appointment   Patient verbalized understanding of instructions that were given to them at the PAT appointment. Patient was also instructed that they will need to review over the PAT instructions again at home before surgery.

## 2019-12-29 NOTE — Patient Instructions (Addendum)
DUE TO COVID-19 ONLY ONE VISITOR IS ALLOWED TO COME WITH YOU AND STAY IN THE WAITING ROOM ONLY DURING PRE OP AND PROCEDURE.   IF YOU WILL BE ADMITTED INTO THE HOSPITAL YOU ARE ALLOWED ONE SUPPORT PERSON DURING VISITATION HOURS ONLY (10AM -8PM)   . The support person may change daily. . The support person must pass our screening, gel in and out, and wear a mask at all times, including in the patient's room. . Patients must also wear a mask when staff or their support person are in the room.   COVID SWAB TESTING MUST BE COMPLETED ON:  Monday, 01-01-20 @ 12:30 PM    [address] [place], [place] [address]  (Must self quarantine after testing. Follow instructions on handout.)         Your procedure is scheduled on:  Thursday, 01-04-20   Report to [facility] Main  Entrance   Report to Short Stay at 5:30 AM   [facility])    Call this number if you have problems the morning of surgery [phone]     Follow prep instructions per surgeon's office:  1/2 BOTTLE OF MAGNESIUM CITRATE AT [place] not eat food :After Midnight.   Oral Hygiene is also important to reduce your risk of infection.                                    Remember - BRUSH YOUR TEETH THE MORNING OF SURGERY WITH YOUR REGULAR TOOTHPASTE   Do NOT smoke after Midnight   Take these medicines the morning of surgery with A SIP OF WATER:  None.                               You may not have any metal on your body including jewelry, and body piercings             Do not wear lotions, powders, perfumes/cologne, or deodorant             Men may shave face and neck.   Do not bring valuables to the hospital. [place].   Contacts, dentures or bridgework may not be worn into surgery.   Bring small overnight bag day of surgery.                 Please read over the following fact sheets you were given: IF YOU HAVE QUESTIONS ABOUT YOUR PRE OP INSTRUCTIONS   PLEASE CALL [phone]   [facility] - Preparing for Surgery Before surgery, you can play an important role.  Because skin is not sterile, your skin needs to be as free of germs as possible.  You can reduce the number of germs on your skin by washing with CHG (chlorahexidine gluconate) soap before surgery.  CHG is an antiseptic cleaner which kills germs and bonds with the skin to continue killing germs even after washing. Please DO NOT use if you have an allergy to CHG or antibacterial soaps.  If your skin becomes reddened/irritated stop using the CHG and inform your nurse when you arrive at Short Stay. Do not shave (including legs and underarms) for at least 48 hours prior to the first CHG shower.  You may shave your face/neck.  Please follow these instructions carefully:  1.  Shower with CHG Soap the night before surgery and the  morning of surgery.  2.  If you choose to wash your hair, wash your hair first as usual with your normal  shampoo.  3.  After you shampoo, rinse your hair and body thoroughly to remove the shampoo.                             4.  Use CHG as you would any other liquid soap.  You can apply chg directly to the skin and wash.  Gently with a scrungie or clean washcloth.  5.  Apply the CHG Soap to your body ONLY FROM THE NECK DOWN.   Do   not use on face/ open                           Wound or open sores. Avoid contact with eyes, ears mouth and   genitals (private parts).                       Wash face,  Genitals (private parts) with your normal soap.             6.  Wash thoroughly, paying special attention to the area where your    surgery  will be performed.  7.  Thoroughly rinse your body with warm water from the neck down.  8.  DO NOT shower/wash with your normal soap after using and rinsing off the CHG Soap.                9.  Pat yourself dry with a clean towel.            10.  Wear clean pajamas.            11.  Place clean sheets on your bed the night of your  first shower and do not  sleep with pets. Day of Surgery : Do not apply any lotions/deodorants the morning of surgery.  Please wear clean clothes to the hospital/surgery center.  FAILURE TO FOLLOW THESE INSTRUCTIONS MAY RESULT IN THE CANCELLATION OF YOUR SURGERY  PATIENT SIGNATURE_________________________________  NURSE SIGNATURE__________________________________  ________________________________________________________________________  WHAT IS A BLOOD TRANSFUSION? Blood Transfusion Information  A transfusion is the replacement of blood or some of its parts. Blood is made up of multiple cells which provide different functions.  Red blood cells carry oxygen and are used for blood loss replacement.  White blood cells fight against infection.  Platelets control bleeding.  Plasma helps clot blood.  Other blood products are available for specialized needs, such as hemophilia or other clotting disorders. BEFORE THE TRANSFUSION  Who gives blood for transfusions?   Healthy volunteers who are fully evaluated to make sure their blood is safe. This is blood bank blood. Transfusion therapy is the safest it has ever been in the practice of medicine. Before blood is taken from a donor, a complete history is taken to make sure that person has no history of diseases nor engages in risky social behavior (examples are intravenous drug use or sexual activity with multiple partners). The donor's travel history is screened to minimize risk of transmitting infections, such as malaria. The donated blood is tested for signs of infectious diseases, such as HIV and hepatitis. The blood is then tested to be sure it is compatible with you in order to minimize the chance  of a transfusion reaction. If you or a relative donates blood, this is often done in anticipation of surgery and is not appropriate for emergency situations. It takes many days to process the donated blood. RISKS AND COMPLICATIONS Although  transfusion therapy is very safe and saves many lives, the main dangers of transfusion include:   Getting an infectious disease.  Developing a transfusion reaction. This is an allergic reaction to something in the blood you were given. Every precaution is taken to prevent this. The decision to have a blood transfusion has been considered carefully by your caregiver before blood is given. Blood is not given unless the benefits outweigh the risks. AFTER THE TRANSFUSION  Right after receiving a blood transfusion, you will usually feel much better and more energetic. This is especially true if your red blood cells have gotten low (anemic). The transfusion raises the level of the red blood cells which carry oxygen, and this usually causes an energy increase.  The nurse administering the transfusion will monitor you carefully for complications. HOME CARE INSTRUCTIONS  No special instructions are needed after a transfusion. You may find your energy is better. Speak with your caregiver about any limitations on activity for underlying diseases you may have. SEEK MEDICAL CARE IF:   Your condition is not improving after your transfusion.  You develop redness or irritation at the intravenous (IV) site. SEEK IMMEDIATE MEDICAL CARE IF:  Any of the following symptoms occur over the next 12 hours:  Shaking chills.  You have a temperature by mouth above 102 F (38.9 C), not controlled by medicine.  Chest, back, or muscle pain.  People around you feel you are not acting correctly or are confused.  Shortness of breath or difficulty breathing.  Dizziness and fainting.  You get a rash or develop hives.  You have a decrease in urine output.  Your urine turns a dark color or changes to pink, red, or brown. Any of the following symptoms occur over the next 10 days:  You have a temperature by mouth above 102 F (38.9 C), not controlled by medicine.  Shortness of breath.  Weakness after normal  activity.  The white part of the eye turns yellow (jaundice).  You have a decrease in the amount of urine or are urinating less often.  Your urine turns a dark color or changes to pink, red, or brown. Document Released: 04/03/2000 Document Revised: 06/29/2011 Document Reviewed: 11/21/2007 [facility] Patient Information 2014 [place], [place].  _______________________________________________________________________

## 2020-01-01 ENCOUNTER — Other Ambulatory Visit: Payer: Self-pay

## 2020-01-01 ENCOUNTER — Other Ambulatory Visit (HOSPITAL_COMMUNITY)
Admission: RE | Admit: 2020-01-01 | Discharge: 2020-01-01 | Disposition: A | Discharge status: Home / Self Care | Payer: PPO | Source: Ambulatory Visit

## 2020-01-01 ENCOUNTER — Encounter (HOSPITAL_COMMUNITY): Payer: Self-pay

## 2020-01-01 ENCOUNTER — Encounter (HOSPITAL_COMMUNITY)
Admission: RE | Admit: 2020-01-01 | Discharge: 2020-01-01 | Disposition: A | Discharge status: Home / Self Care | Payer: PPO | Source: Ambulatory Visit | Attending: Urology | Admitting: Urology

## 2020-01-01 DIAGNOSIS — Z01812 Encounter for preprocedural laboratory examination: Secondary | ICD-10-CM | POA: Insufficient documentation

## 2020-01-01 DIAGNOSIS — J45909 Unspecified asthma, uncomplicated: Secondary | ICD-10-CM | POA: Diagnosis present

## 2020-01-01 DIAGNOSIS — C642 Malignant neoplasm of left kidney, except renal pelvis: Secondary | ICD-10-CM | POA: Insufficient documentation

## 2020-01-01 DIAGNOSIS — I214 Non-ST elevation (NSTEMI) myocardial infarction: Secondary | ICD-10-CM | POA: Diagnosis not present

## 2020-01-01 DIAGNOSIS — C7902 Secondary malignant neoplasm of left kidney and renal pelvis: Secondary | ICD-10-CM | POA: Diagnosis present

## 2020-01-01 DIAGNOSIS — I700 Atherosclerosis of aorta: Secondary | ICD-10-CM | POA: Diagnosis present

## 2020-01-01 DIAGNOSIS — Z8546 Personal history of malignant neoplasm of prostate: Secondary | ICD-10-CM | POA: Diagnosis not present

## 2020-01-01 DIAGNOSIS — Z20822 Contact with and (suspected) exposure to covid-19: Secondary | ICD-10-CM | POA: Diagnosis present

## 2020-01-01 DIAGNOSIS — Z951 Presence of aortocoronary bypass graft: Secondary | ICD-10-CM | POA: Diagnosis not present

## 2020-01-01 DIAGNOSIS — I10 Essential (primary) hypertension: Secondary | ICD-10-CM | POA: Diagnosis not present

## 2020-01-01 DIAGNOSIS — I4891 Unspecified atrial fibrillation: Secondary | ICD-10-CM | POA: Diagnosis present

## 2020-01-01 DIAGNOSIS — E785 Hyperlipidemia, unspecified: Secondary | ICD-10-CM | POA: Diagnosis not present

## 2020-01-01 DIAGNOSIS — Z9221 Personal history of antineoplastic chemotherapy: Secondary | ICD-10-CM | POA: Diagnosis not present

## 2020-01-01 DIAGNOSIS — C652 Malignant neoplasm of left renal pelvis: Secondary | ICD-10-CM | POA: Diagnosis not present

## 2020-01-01 DIAGNOSIS — C772 Secondary and unspecified malignant neoplasm of intra-abdominal lymph nodes: Secondary | ICD-10-CM | POA: Diagnosis not present

## 2020-01-01 DIAGNOSIS — E7800 Pure hypercholesterolemia, unspecified: Secondary | ICD-10-CM | POA: Diagnosis present

## 2020-01-01 DIAGNOSIS — I252 Old myocardial infarction: Secondary | ICD-10-CM | POA: Diagnosis not present

## 2020-01-01 HISTORY — DX: Malignant neoplasm of urinary organ, unspecified: C68.9

## 2020-01-01 LAB — CBC
HCT: 31.6 % — ABNORMAL LOW (ref 39.0–52.0)
Hemoglobin: 10.5 g/dL — ABNORMAL LOW (ref 13.0–17.0)
MCH: 31.1 pg (ref 26.0–34.0)
MCHC: 33.2 g/dL (ref 30.0–36.0)
MCV: 93.5 fL (ref 80.0–100.0)
Platelets: 218 10*3/uL (ref 150–400)
RBC: 3.38 MIL/uL — ABNORMAL LOW (ref 4.22–5.81)
RDW: 17.1 % — ABNORMAL HIGH (ref 11.5–15.5)
WBC: 6.3 10*3/uL (ref 4.0–10.5)
nRBC: 0 % (ref 0.0–0.2)

## 2020-01-01 LAB — BASIC METABOLIC PANEL
Anion gap: 9 (ref 5–15)
BUN: 24 mg/dL — ABNORMAL HIGH (ref 8–23)
CO2: 27 mmol/L (ref 22–32)
Calcium: 9.2 mg/dL (ref 8.9–10.3)
Chloride: 104 mmol/L (ref 98–111)
Creatinine, Ser: 1.2 mg/dL (ref 0.61–1.24)
GFR calc Af Amer: >60 mL/min (ref >60)
GFR calc non Af Amer: 58 mL/min — ABNORMAL LOW (ref >60)
Glucose, Bld: 100 mg/dL — ABNORMAL HIGH (ref 70–99)
Potassium: 4.6 mmol/L (ref 3.5–5.1)
Sodium: 140 mmol/L (ref 135–145)

## 2020-01-01 NOTE — Progress Notes (Signed)
CBC sent to Dr. Alinda Money for review.

## 2020-01-02 LAB — SARS CORONAVIRUS 2 (TAT 6-24 HRS): SARS Coronavirus 2: NEGATIVE

## 2020-01-02 NOTE — Anesthesia Preprocedure Evaluation (Deleted)
Anesthesia Evaluation    Airway        Dental   Pulmonary           Cardiovascular hypertension,      Neuro/Psych    GI/Hepatic   Endo/Other    Renal/GU      Musculoskeletal   Abdominal   Peds  Hematology   Anesthesia Other Findings   Reproductive/Obstetrics                             Anesthesia Physical Anesthesia Plan  ASA:   Anesthesia Plan:    Post-op Pain Management:    Induction:   PONV Risk Score and Plan:   Airway Management Planned:   Additional Equipment:   Intra-op Plan:   Post-operative Plan:   Informed Consent:   Plan Discussed with:   Anesthesia Plan Comments: (See PAT note 01/01/2020, Konrad Felix, PA-C)        Anesthesia Quick Evaluation

## 2020-01-02 NOTE — Progress Notes (Signed)
Anesthesia Chart Review   Case: [id number] Date/Time: 01/04/20 0645   Procedure: XI ROBOT ASSITED LAPAROSCOPIC NEPHROURETERECTOMY (Left ) - NEEDS 240 MIN FOR PROCEDURE   Anesthesia type: General   Pre-op diagnosis: UROTHELIAL CARCINOMA OF LEFT KIDNEY   Location: [facility] ROOM 03 / [facility]   Surgeons: Raynelle Bring, MD      DISCUSSION:74 y.o. never smoker with h/o PONV, HTN, CAD (CABG x 3), LBBB, a-fib, s/p AVR 04/29/2018, urothelial carcinoma of left kidney scheduled for above procedure 01/04/2020 with Dr. Raynelle Bring.   Last seen by cardiology 12/13/2019.  Stable at this visit.  Per OV note, "Sigifredo is at low risk for major cardiovascular complications with the planned nephrectomy.  Even though he has extensive coronary and valvular heart disease, these have been addressed with surgery very recently.  He has excellent functional status.  Aspirin can be stopped for 5-7 days before the surgical procedure.  His metoprolol should not be interrupted around the time of surgery."  Anticipate pt can proceed with planned procedure barring acute status change.   VS: BP 130/69   Pulse 83   Temp 36.8 C (Oral)   Resp 16   Ht 6' (1.829 m)   Wt 89.8 kg   SpO2 99%   BMI 26.85 kg/m   PROVIDERS: [name], MD is PCP   [name], Mihai, MD is Cardiologist  LABS: Labs reviewed: Acceptable for surgery. (all labs ordered are listed, but only abnormal results are displayed)  Labs Reviewed  BASIC METABOLIC PANEL - Abnormal; Notable for the following components:      Result Value   Glucose, Bld 100 (*)    BUN 24 (*)    GFR calc non Af Amer 58 (*)    All other components within normal limits  CBC - Abnormal; Notable for the following components:   RBC 3.38 (*)    Hemoglobin 10.5 (*)    HCT 31.6 (*)    RDW 17.1 (*)    All other components within normal limits  TYPE AND SCREEN     IMAGES:   EKG: 12/13/2019 Rate 69 bpm  Sinus rhythm with 1st degree AV block  Nonspecific intraventricular  block  Possible anterolateral infarct, age undetermined   CV: Echo 02/21/2019 IMPRESSIONS    1. Left ventricular ejection fraction, by visual estimation, is 55 to  60%. The left ventricle has normal function. There is mildly increased  left ventricular hypertrophy.  2. Left ventricular diastolic parameters are consistent with Grade I  diastolic dysfunction (impaired relaxation).  3. Global right ventricle has normal systolic function.The right  ventricular size is normal. No increase in right ventricular wall  thickness.  4. Left atrial size was normal.  5. Right atrial size was normal.  6. The mitral valve is normal in structure. Trace mitral valve  regurgitation. No evidence of mitral stenosis.  7. The tricuspid valve is normal in structure. Tricuspid valve  regurgitation is mild.  8. The aortic valve is normal in structure. Aortic valve regurgitation is  not visualized. No evidence of aortic valve sclerosis or stenosis.  9. The pulmonic valve was normal in structure. Pulmonic valve  regurgitation is not visualized.  10. Mildly elevated pulmonary artery systolic pressure.  11. The inferior vena cava is normal in size with greater than 50%  respiratory variability, suggesting right atrial pressure of 3 mmHg Past Medical History:  Diagnosis Date  . Arthritis    mild hands  . Asthma    pt denies 4/21   .  Chest pain 07/25/2012  . Coronary artery disease   . Dysrhythmia    PAC'S, hx of atrial fib after OHS 04/2018  . ED (erectile dysfunction)   . H/O hiatal hernia   . Heart murmur   . Hypercholesterolemia   . Hypertension    MARGINALLY HIGH - NO MEDS -MONITORS  . Myocardial infarction ([place])    2017   . Pneumonia    hx of 2018   . PONV (postoperative nausea and vomiting)   . Prostate cancer ([place]) 03/27/2011   prostate bx=adenocarcinoma,  . Skin cancer 2008   basal cell face /removed  . Sleep apnea    borderline sleep apnea per patient no devices   . Urothelial  cancer [facility])     Past Surgical History:  Procedure Laterality Date  . AORTIC VALVE REPLACEMENT N/A 04/29/2018   Procedure: AORTIC VALVE REPLACEMENT (AVR) using INSPIRIS RESILIA  AORTIC VALVE 70mm;  Surgeon: Ivin Poot, MD;  Location: [facility];  Service: Open Heart Surgery;  Laterality: N/A;  . basal cell cancer  2008   inside nose  . CARDIAC CATHETERIZATION N/A 06/03/2015   Procedure: Left Heart Cath and Coronary Angiography;  Surgeon: [name] Sine, MD;  Location: [place] CV LAB;  Service: Cardiovascular;  Laterality: N/A;  . CATARACT EXTRACTION, BILATERAL  2007  . CORONARY ARTERY BYPASS GRAFT N/A 04/29/2018   Procedure: CORONARY ARTERY BYPASS GRAFTING (CABG) X 3; Using Left Internal Mammary Artery (LIMA) and Right Leg Greater Saphenous Vein Graft (SVG);  LIMA to LAD, SVG to OM1, SVG to RCA,;  Surgeon: Ivin Poot, MD;  Location: [place];  Service: Open Heart Surgery;  Laterality: N/A;  . CYSTOSCOPY/RETROGRADE/URETEROSCOPY Left 08/21/2019   Procedure: CYSTOSCOPY/RETROGRADE/URETEROSCOPY/  LEFT BIOPSY/BRUSH BIOPSY/RENAL WASHINGS/ AND STENT PLACEMENT;  Surgeon: Raynelle Bring, MD;  [facility];  Service: Urology;  Laterality: Left;  . IR IMAGING GUIDED PORT INSERTION  09/28/2019  . RIGHT/LEFT HEART CATH AND CORONARY ANGIOGRAPHY N/A 04/26/2018   Procedure: RIGHT/LEFT HEART CATH AND CORONARY ANGIOGRAPHY;  Surgeon: [name] Sine, MD;  Location: [place] CV LAB;  Service: Cardiovascular;  Laterality: N/A;  . ROBOT ASSISTED LAPAROSCOPIC RADICAL PROSTATECTOMY  08/10/2011   Procedure: ROBOTIC ASSISTED LAPAROSCOPIC RADICAL PROSTATECTOMY LEVEL 2;  Surgeon: Dutch Gray, MD;  [facility];  Service: Urology;  Laterality: N/A;  . TEE WITHOUT CARDIOVERSION N/A 04/29/2018   Procedure: TRANSESOPHAGEAL ECHOCARDIOGRAM (TEE);  Surgeon: Prescott Gum, Collier Salina, MD;  Location: [facility];  Service: Open Heart Surgery;  Laterality: N/A;  . THYROID SURGERY  infant   uncertain details; no thyroid replacement listed   . WISDOM TOOTH EXTRACTION      MEDICATIONS: . amLODipine (NORVASC) 2.5 MG tablet  . aspirin EC 81 MG tablet  . atorvastatin (LIPITOR) 80 MG tablet  . chlorhexidine (PERIDEX) 0.12 % solution  . Cholecalciferol (VITAMIN D-3) 125 MCG (5000 UT) TABS  . metoprolol succinate (TOPROL-XL) 100 MG 24 hr tablet  . nitroGLYCERIN (NITROSTAT) 0.4 MG SL tablet  . prochlorperazine (COMPAZINE) 10 MG tablet  . traZODone (DESYREL) 50 MG tablet  . vitamin E 400 UNIT capsule   No current facility-administered medications for this encounter.     Konrad Felix, PA-C [facility] Pre-Surgical Testing [phone]

## 2020-01-03 ENCOUNTER — Encounter (HOSPITAL_COMMUNITY): Payer: Self-pay | Admitting: Urology

## 2020-01-03 NOTE — Anesthesia Preprocedure Evaluation (Addendum)
Anesthesia Evaluation  Patient identified by MRN, date of birth, ID band Patient awake    Reviewed: Allergy & Precautions, NPO status , Patient's Chart, lab work & pertinent test results, reviewed documented beta blocker date and time   History of Anesthesia Complications (+) PONV and history of anesthetic complications  Airway Mallampati: II  TM Distance: >3 FB Neck ROM: Full    Dental  (+) Dental Advisory Given, Lower Dentures, Partial Upper   Pulmonary neg pulmonary ROS,    Pulmonary exam normal        Cardiovascular hypertension, Pt. on medications and Pt. on home beta blockers + CAD, + Past MI and + CABG  Normal cardiovascular exam+ dysrhythmias Atrial Fibrillation + Valvular Problems/Murmurs   S/P AVR Last seen by cardiology 12/13/2019.  Stable at this visit.  Per OV note, "Mat is at low risk for major cardiovascular complications with the planned nephrectomy.  Even though he has extensive coronary and valvular heart disease, these have been addressed with surgery very recently.  He has excellent functional status.  Aspirin can be stopped for 5-7 days before the surgical procedure.  His metoprolol should not be interrupted around the time of surgery."  Echo 02/21/2019 IMPRESSIONS    1. Left ventricular ejection fraction, by visual estimation, is 55 to  60%. The left ventricle has normal function. There is mildly increased  left ventricular hypertrophy.  2. Left ventricular diastolic parameters are consistent with Grade I  diastolic dysfunction (impaired relaxation).  3. Global right ventricle has normal systolic function.The right  ventricular size is normal. No increase in right ventricular wall  thickness.  4. Left atrial size was normal.  5. Right atrial size was normal.  6. The mitral valve is normal in structure. Trace mitral valve  regurgitation. No evidence of mitral stenosis.  7. The tricuspid valve is  normal in structure. Tricuspid valve  regurgitation is mild.  8. The aortic valve is normal in structure. Aortic valve regurgitation is  not visualized. No evidence of aortic valve sclerosis or stenosis.  9. The pulmonic valve was normal in structure. Pulmonic valve  regurgitation is not visualized.  10. Mildly elevated pulmonary artery systolic pressure.  11. The inferior vena cava is normal in size with greater than 50%  respiratory variability, suggesting right atrial pressure of 3 mmHg   Neuro/Psych negative neurological ROS  negative psych ROS   GI/Hepatic negative GI ROS, Neg liver ROS,   Endo/Other  negative endocrine ROS  Renal/GU   negative genitourinary   Musculoskeletal negative musculoskeletal ROS (+)   Abdominal   Peds negative pediatric ROS (+)  Hematology negative hematology ROS (+) anemia ,   Anesthesia Other Findings   Reproductive/Obstetrics negative OB ROS                            Anesthesia Physical  Anesthesia Plan  ASA: III  Anesthesia Plan: General   Post-op Pain Management:    Induction: Intravenous  PONV Risk Score and Plan: 4 or greater and Ondansetron, Dexamethasone, Treatment may vary due to age or medical condition, Propofol infusion and Diphenhydramine  Airway Management Planned: Oral ETT  Additional Equipment:   Intra-op Plan:   Post-operative Plan: Extubation in OR  Informed Consent: I have reviewed the patients History and Physical, chart, labs and discussed the procedure including the risks, benefits and alternatives for the proposed anesthesia with the patient or authorized representative who has indicated his/her understanding and  acceptance.     Dental advisory given  Plan Discussed with: CRNA and Anesthesiologist  Anesthesia Plan Comments:        Anesthesia Quick Evaluation

## 2020-01-03 NOTE — H&P (Signed)
Office Visit Report     12/05/2019   --------------------------------------------------------------------------------   Lawrence Hall  MRN: [id number]  DOB: 02/21/1946, 74 year old Male  SSN: [id number]   PRIMARY CARE:  Lawrence Cipro, MD  REFERRING:  Lawrence Hall, Lawrence Hall  PROVIDER:  Raynelle Lawrence Hall, M.D.  LOCATION:  [facility] [phone]     --------------------------------------------------------------------------------   CC/HPI: Urothelial carcinoma of the left kidney   Mr. Lawrence Hall was noted to have a 6 cm infiltrative left upper pole renal mass concerning for renal cell carcinoma vs. urothelial carcinoma on a CT scan in March performed for gross hematuria. In addition, he was noted to have a 9 mm pancreatic lesion of undetermined significance. He also was noted to have a disc protrusion of his lumbar spine raising possible concern for tumor although no lesion was clearly identified. He also was noted to have an 11 mm left renal hilar lymph node. Incidentally, he was also noted to have a left-sided inferior vena cava. He returns today after undergoing left ureteroscopy with biopsy that confirmed high grade urothelial carcinoma. He has recovered well from his procedure. He does currently have a left ureteral stent. CT imaging of the chest was unremarkable.   His case was presented at the GU tumor board conference in May. During that discussion, it was decided that he could proceed with follow-up imaging of the pancreatic lesion in 3 months. In order to obtain the best imaging possible, it was recommended that this be an MRCP protocol. I have also spoken about this with Dr. Barry Lawrence Hall who felt that if this is persistent raises concern that a direct referral to Gastroenterology for an EUS directed biopsy would be appropriate as a next step. It was felt that this could be further addressed following definitive therapy of his urothelial carcinoma which is more pressing at this time.    I also discussed his situation in detail with Dr. Alen Lawrence Hall, Medical Oncology. Considering this small left renal hilar lymph node and the large size of this lesion and the fact that he does currently have normal renal function with a baseline serum creatinine of 0.8, Dr. Alen Lawrence Hall did agree the neoadjuvant chemotherapy would likely be of benefit prior to surgical treatment. He did proceed and complete 4 cycles of neoadjuvant gemcitabine/cisplatin therapy.   He presents today after finishing chemotherapy and undergoing repeat staging with a CT of the chest and MRCP of the abdomen. He also scheduled undergo cystoscopy to ensure that he has no evidence of bladder involvement prior to his upcoming surgery. Overall, he has tolerated chemotherapy although had significant fatigue and did have anemia and leukopenia. He follows up today with repeat labs.     ALLERGIES: No Allergies    MEDICATIONS: Aspirin PO Daily  Lipitor PO Daily  METOPROLOL SUCCINATE PO Daily  Vitamin B12  VITAMIN D3 PO Daily  VITAMIN E PO Daily     GU PSH: Biopsy Skin Lesion - 2012 Cysto Remove Stent FB Sim - 09/05/2019 Cysto Uretero Biopsy Fulgura, Left - 08/21/2019 Cystoscopy - 07/14/2019 Cystoscopy Insert Stent, Left - 08/21/2019 Laparoscopy; Lymphadenectomy - 2013 Locm 300-399Mg /Ml Iodine,1Ml - 11/22/2019, 08/10/2019, 07/26/2019       PSH Notes: Cath Stent Placement, Prostatectomy Robotic-Assisted, Laparoscopy With Bilateral Total Pelvic Lymphadenectomy, Biopsy Skin   NON-GU PSH: Cataract Surgery.. - about 2014 Coronary Artery Bypass Grafting - about 2020 Heart Stents - about 2017     GU PMH: Renal pelvis cancer, left - 09/05/2019 Left renal neoplasm -  08/01/2019 Microscopic hematuria - 07/14/2019 Acute Cystitis/UTI - 05/03/2019 Stress Incontinence, Male stress incontinence - 2017 ED due to arterial insufficiency, Erectile dysfunction due to arterial insufficiency - 2012 Prostate Cancer, Prostate cancer - 2012      PMH  Notes:   1) Prostate cancer: He is s/p a BNS RAL radical prostatectomy on 08/10/11. His PSA has been undetectable since surgery.   Diagnosis: pT2c N0 Mx, Gleason 3+4=7 adenocarcinoma with negative surgical margins  Pretreatment PSA: 8.36  Pretreatment SHIM: 16   2) Hematuria:     NON-GU PMH: Bacteriuria (Stable) - 08/01/2019, - 05/03/2019 Myocardial Infarction, History of myocardial infarction - 2017 Asthma, Asthma - 2014 Arrhythmia Atrial Fibrillation Cardiac murmur, unspecified Heart disease, unspecified Hypercholesterolemia Hypertension    FAMILY HISTORY: Cancer - Runs In Family malignant neoplasm - Runs In Family   SOCIAL HISTORY: Marital Status: Married Preferred Language: English; Ethnicity: Not Hispanic Or Latino; Race: White Current Smoking Status: Patient has never smoked.  Does not use smokeless tobacco. Has not drank since 04/30/1999. Had 2 drinks per week. Drinks 1 caffeinated drink per day. Has not had a blood transfusion. Patient's occupation Theme park manager.     Notes: Alcohol Use, Marital History - Currently Married, Occupation:, Never A Smoker   REVIEW OF SYSTEMS:    GU Review Male:   Patient denies frequent urination, hard to postpone urination, burning/ pain with urination, get up at night to urinate, leakage of urine, stream starts and stops, trouble starting your streams, and have to strain to urinate .  Gastrointestinal (Lower):   Patient denies diarrhea and constipation.  Gastrointestinal (Upper):   Patient denies nausea and vomiting.  Constitutional:   Patient denies fever, night sweats, weight loss, and fatigue.  Skin:   Patient denies itching and skin rash/ lesion.  Eyes:   Patient denies blurred vision and double vision.  Ears/ Nose/ Throat:   Patient denies sore throat and sinus problems.  Hematologic/Lymphatic:   Patient denies swollen glands and easy bruising.  Cardiovascular:   Patient denies leg swelling and chest pains.   Respiratory:   Patient denies cough and shortness of breath.  Endocrine:   Patient denies excessive thirst.  Musculoskeletal:   Patient denies back pain and joint pain.  Neurological:   Patient denies headaches and dizziness.  Psychologic:   Patient denies depression and anxiety.   VITAL SIGNS:      12/05/2019 09:46 AM  Weight 185 lb / 83.91 kg  Height 72 in / 182.88 cm  BP 110/71 mmHg  Pulse 81 /min  BMI 25.1 kg/m   GU PHYSICAL EXAMINATION:    Urethral Meatus: Normal size. No lesion, no wart, no discharge, no polyp. Normal location.   MULTI-SYSTEM PHYSICAL EXAMINATION:    Constitutional: Well-nourished. No physical deformities. Normally developed. Good grooming.  Neck: Neck symmetrical, not swollen. Normal tracheal position.  Respiratory: No labored breathing, no use of accessory muscles. Clear bilaterally.  Cardiovascular: Normal temperature, normal extremity pulses, no swelling, no varicosities. Regular rate and rhythm.  Lymphatic: No enlargement of neck, axillae, groin.  Skin: No paleness, no jaundice, no cyanosis. No lesion, no ulcer, no rash.  Neurologic / Psychiatric: Oriented to time, oriented to place, oriented to person. No depression, no anxiety, no agitation.  Gastrointestinal: Small prior incision sites from his robotic prostatectomy are noted. These are well healed.  Eyes: Normal conjunctivae. Normal eyelids.  Ears, Nose, Mouth, and Throat: Left ear no scars, no lesions, no masses. Right ear no scars, no lesions, no masses.  Nose no scars, no lesions, no masses. Normal hearing. Normal lips.  Musculoskeletal: Normal gait and station of head and neck.     Complexity of Data:  Lab Test Review:   CBC with Diff, CMP  Records Review:   Previous Patient Records  X-Ray Review: C.T. Chest: Reviewed Films.  MRI Abdomen: Reviewed Films.     05/05/19 06/20/15 06/19/14 10/26/13 04/25/13 09/07/12 03/02/12 09/29/11  PSA  Total PSA <0.015 ng/mL <0.01  <0.01  <0.01  <0.01  <0.01   <0.01  <0.01    Notes:                     CLINICAL DATA: History of renal neoplasm, high-grade urothelial  neoplasm   EXAM:  CT CHEST WITH CONTRAST   TECHNIQUE:  Multidetector CT imaging of the chest was performed during  intravenous contrast administration.   CONTRAST: 100 cc Omnipaque 300   COMPARISON: Multiple prior studies most recent August 10, 2019   FINDINGS:  Cardiovascular: RIGHT-sided Port-A-Cath terminates at the caval to  atrial junction. Signs of CABG and aortic valve replacement. Stable  caliber of the thoracic aorta. Central pulmonary vasculature is of  normal caliber. No pericardial effusion.   Mediastinum/Nodes: Esophagus grossly normal. Thoracic inlet  structures are normal. No axillary lymphadenopathy. No hilar  lymphadenopathy. No mediastinal adenopathy.   Lungs/Pleura: Lungs are clear. No consolidation, effusion, mass or  nodule.   Upper Abdomen: Multiple stones in a contracted gallbladder as  before. Known upper pole LEFT renal mass with infiltrative features  measuring approximately 4.9 x 4.9 cm as compared to 5.8 x 5.7 cm.  Mass is incompletely imaged and effaces the upper portion of the  renal sinus on today's study.   Musculoskeletal: No chest wall mass. No acute musculoskeletal  process. Spinal degenerative changes.   IMPRESSION:  1. Known upper pole LEFT renal mass with infiltrative features  measuring approximately 4.9 x 4.9 cm as compared to 5.8 x 5.7 cm  though is incompletely imaged. Mass is incompletely imaged and  effaces the upper portion of the renal sinus on today's study.  2. Stranding arising from the mass abuts and or involves the LEFT  adrenal. Attention on follow-up.  3. In the upper abdomen on the last image there are LEFT  retroperitoneal lymph nodes which are stable to slightly larger  though are incompletely imaged. Attention to these areas on  subsequent abdominal imaging may be helpful.  4. Cholelithiasis.  5. Signs of  CABG and aortic valve replacement.  6. Aortic atherosclerosis.   Aortic Atherosclerosis (ICD10-I70.0).    Electronically Signed  By: Zetta Bills M.D.  On: 11/22/2019 20:51   CLINICAL DATA: Follow-up left renal cell carcinoma,, chemotherapy,  remote history of prostate cancer status post prostatectomy   EXAM:  MRI ABDOMEN AND PELVIS WITHOUT AND WITH CONTRAST   TECHNIQUE:  Multiplanar multisequence MR imaging of the abdomen and pelvis was  performed both before and after the administration of intravenous  contrast.   CONTRAST: 73mL MULTIHANCE GADOBENATE DIMEGLUMINE 529 MG/ML IV SOLN   COMPARISON: CT abdomen pelvis, 07/26/2018   FINDINGS:  COMBINED FINDINGS FOR BOTH MR ABDOMEN AND PELVIS   Lower chest: No acute findings.   Hepatobiliary: No mass or other parenchymal abnormality identified.  Gallstones in the gallbladder.   Pancreas: No mass, inflammatory changes, or other parenchymal  abnormality identified.   Spleen: Within normal limits in size and appearance.   Adrenals/Urinary Tract: Redemonstrated, heterogeneously contrast  enhancing exophytic mass of  the superior pole of the left kidney,  measuring 5.1 x 4.4 x 4.2 cm, previously 6.6 x 5.7 x 5.0 cm (series  12, image 44, series 2, image 12). This mass closely abuts the  lateral limb of the left adrenal gland (series 10, image 39). No  evidence of hydronephrosis. Thickening of the urinary bladder,  likely due to chronic outlet obstruction   Stomach/Bowel: Visualized portions within the abdomen are  unremarkable.   Vascular/Lymphatic: No pathologically enlarged lymph nodes  identified. Incidental variant left-sided IVC noted. No abdominal  aortic aneurysm demonstrated.   Reproductive: Status post prostatectomy. No evidence of locally  recurrent mass or suspicious contrast enhancement.   Other: None.   Musculoskeletal: No suspicious bone lesions identified. There  appears to be an intrinsically T1  hyperintense Schmorl node of the  inferior endplate of L4 without significant contrast enhancement  (series 16, image 21)..   IMPRESSION:  1. Interval decrease in size of an exophytic mass of the superior  pole of the left kidney, consistent with treatment response of renal  cell carcinoma.   2. As previously noted, there appears to be close abutment of the  mass to the left adrenal gland, concerning for local involvement.   3. No definite evidence of lymphadenopathy or metastatic disease in  the abdomen or pelvis.   4. No MR correlate for a previously described nodule of the  pancreatic head, likely representing prominent parenchymal  lobulation of the pancreas.   5. There appears to be a Schmorl type inferior endplate deformity of  the L4 vertebral body without evident soft tissue component or  contrast enhancement. Continued attention on follow-up.   6. Status post prostatectomy without evidence of recurrence in the  pelvis.   7. Cholelithiasis.   8. Redemonstrated incidental left-sided inferior vena cava.    Electronically Signed  By: Eddie Candle M.D.  On: 11/27/2019 14:24   Hemoglobin is now 9.1 which is up from 8.2 at his prior visit from the [place].   PROCEDURES:         Flexible Cystoscopy - 52000  Indication: Urothelial carcinoma of the left renal pelvis Risks, benefits, and potential complications of the procedure were discussed with the patient including infection, bleeding, voiding discomfort, urinary retention, fever, chills, sepsis, and others. All questions were answered. Informed consent was obtained. Sterile technique and intraurethral analgesia were used.  Meatus:  Normal size. Normal location. Normal condition.  Urethra:  No strictures.  External Sphincter:  Normal.  Verumontanum:  Normal.  Prostate:  Surgically absent.  Bladder Neck:  Non-obstructing.  Ureteral Orifices:  Normal location. Normal size. Normal shape. Effluxed clear urine.   Bladder:  No trabeculation. No tumors. Normal mucosa. No stones.      Chaperone: Alleen Borne The procedure was well-tolerated and without complications. Instructions were given to call the office immediately if questions or problems.         Urinalysis Dipstick Dipstick Cont'd  Color: Yellow Bilirubin: Neg mg/dL  Appearance: Clear Ketones: Neg mg/dL  Specific Gravity: 1.025 Blood: Neg ery/uL  pH: 5.5 Protein: Neg mg/dL  Glucose: Neg mg/dL Urobilinogen: 0.2 mg/dL    Nitrites: Neg    Leukocyte Esterase: Neg leu/uL    ASSESSMENT:      ICD-10 Details  1 GU:   Renal pelvis cancer, left - C65.2    PLAN:           Schedule Labs: 2 Weeks - CBC with Diff  Document Letter(s):  Created for Patient: Clinical Summary         Notes:   1. High-grade urothelial carcinoma of the left renal pelvis: We reviewed his imaging studies and his recent lab studies. At this time, he has been fairly anemic although this is improving on his most recent laboratory studies. I will confer with Dr. Alen Lawrence Hall but feel that he likely does not need to proceed with a 4th cycle of chemotherapy considering his excellent response to 3 cycles based on imaging and the concerns of toxicity prior to surgery. Furthermore, he has no evidence of bladder involvement today. I therefore think he can proceed as planned with a left robot assisted laparoscopic nephro ureterectomy as scheduled. Considering the concern of possible lymph node involvement on his initial imaging study prior to chemotherapy, we will also plan for a lymphadenectomy. We have reviewed the potential risks and complications as well as the expected recovery process of this surgery in detail today. Specifically, we have discussed the risks of general anesthesia with regard to cardiopulmonary risk, the risks of bleeding, infection, and the risk of a recurrent malignancy and need for surveillance. We discussed the risks of damage to adjacent structures including but  not limited to the lung, spleen, colon, pancreas, major vascular and neurological structures, rectum, urine leak, etc. He has numerous questions that were answered to his stated satisfaction.   He is therefore prepared to proceed with a left robot assisted laparoscopic nephro ureterectomy next month. We did discuss the possibility of adhesions/more difficult dissection due to his prior pelvic surgery. In addition, a repeat CBC will be performed in 2 weeks to ensure continued improvement of his anemia.   Cc: Dr. [name]  Dr. Rachell Hall        Next Appointment:      Next Appointment: 12/19/2019 02:00 PM    Appointment Type: Laboratory Appointment    Location: [facility] [phone]    Provider: Lab LAB    Reason for Visit: 2 wk cbc with diff      E & M CODES: We spent 72 minutes dedicated to evaluation and management time, including face to face interaction, discussions on coordination of care, documentation, result review, and discussion with others as applicable.     * Signed by Lawrence Hall, M.D. on 12/05/19 at 9:40 PM (EDT)*

## 2020-01-04 ENCOUNTER — Other Ambulatory Visit: Payer: Self-pay

## 2020-01-04 ENCOUNTER — Encounter (HOSPITAL_COMMUNITY): Payer: Self-pay | Admitting: Urology

## 2020-01-04 ENCOUNTER — Inpatient Hospital Stay (HOSPITAL_COMMUNITY): Payer: PPO | Admitting: Physician Assistant

## 2020-01-04 ENCOUNTER — Encounter (HOSPITAL_COMMUNITY)
Admission: RE | Disposition: A | Discharge status: Home / Self Care | Payer: Self-pay | Source: Home / Self Care | Attending: Urology

## 2020-01-04 ENCOUNTER — Inpatient Hospital Stay (HOSPITAL_COMMUNITY)
Admission: RE | Admit: 2020-01-04 | Discharge: 2020-01-06 | DRG: 658 | Disposition: A | Discharge status: Home / Self Care | Payer: PPO | Attending: Urology | Admitting: Urology

## 2020-01-04 ENCOUNTER — Inpatient Hospital Stay (HOSPITAL_COMMUNITY): Payer: PPO | Admitting: Anesthesiology

## 2020-01-04 DIAGNOSIS — Z951 Presence of aortocoronary bypass graft: Secondary | ICD-10-CM | POA: Diagnosis not present

## 2020-01-04 DIAGNOSIS — E7800 Pure hypercholesterolemia, unspecified: Secondary | ICD-10-CM | POA: Diagnosis present

## 2020-01-04 DIAGNOSIS — I252 Old myocardial infarction: Secondary | ICD-10-CM | POA: Diagnosis not present

## 2020-01-04 DIAGNOSIS — I4891 Unspecified atrial fibrillation: Secondary | ICD-10-CM | POA: Diagnosis present

## 2020-01-04 DIAGNOSIS — J45909 Unspecified asthma, uncomplicated: Secondary | ICD-10-CM | POA: Diagnosis present

## 2020-01-04 DIAGNOSIS — C652 Malignant neoplasm of left renal pelvis: Secondary | ICD-10-CM | POA: Diagnosis present

## 2020-01-04 DIAGNOSIS — Z9221 Personal history of antineoplastic chemotherapy: Secondary | ICD-10-CM

## 2020-01-04 DIAGNOSIS — C7902 Secondary malignant neoplasm of left kidney and renal pelvis: Principal | ICD-10-CM | POA: Diagnosis present

## 2020-01-04 DIAGNOSIS — I700 Atherosclerosis of aorta: Secondary | ICD-10-CM | POA: Diagnosis present

## 2020-01-04 DIAGNOSIS — Z20822 Contact with and (suspected) exposure to covid-19: Secondary | ICD-10-CM | POA: Diagnosis present

## 2020-01-04 DIAGNOSIS — Z8546 Personal history of malignant neoplasm of prostate: Secondary | ICD-10-CM | POA: Diagnosis not present

## 2020-01-04 HISTORY — PX: ROBOT ASSITED LAPAROSCOPIC NEPHROURETERECTOMY: SHX6077

## 2020-01-04 LAB — BASIC METABOLIC PANEL
Anion gap: 9 (ref 5–15)
BUN: 25 mg/dL — ABNORMAL HIGH (ref 8–23)
CO2: 25 mmol/L (ref 22–32)
Calcium: 8.8 mg/dL — ABNORMAL LOW (ref 8.9–10.3)
Chloride: 106 mmol/L (ref 98–111)
Creatinine, Ser: 1.49 mg/dL — ABNORMAL HIGH (ref 0.61–1.24)
GFR calc Af Amer: 52 mL/min — ABNORMAL LOW (ref >60)
GFR calc non Af Amer: 45 mL/min — ABNORMAL LOW (ref >60)
Glucose, Bld: 143 mg/dL — ABNORMAL HIGH (ref 70–99)
Potassium: 4.3 mmol/L (ref 3.5–5.1)
Sodium: 140 mmol/L (ref 135–145)

## 2020-01-04 LAB — HEMOGLOBIN AND HEMATOCRIT, BLOOD
HCT: 29.3 % — ABNORMAL LOW (ref 39.0–52.0)
Hemoglobin: 9.7 g/dL — ABNORMAL LOW (ref 13.0–17.0)

## 2020-01-04 LAB — TYPE AND SCREEN
ABO/RH(D): A POS
Antibody Screen: NEGATIVE

## 2020-01-04 SURGERY — NEPHROURETERECTOMY, ROBOT-ASSISTED, LAPAROSCOPIC
Anesthesia: General | Laterality: Left

## 2020-01-04 MED ORDER — EPHEDRINE SULFATE-NACL 50-0.9 MG/10ML-% IV SOSY
PREFILLED_SYRINGE | INTRAVENOUS | Status: DC | PRN
  Administered 2020-01-04 (×2): 10 mg via INTRAVENOUS

## 2020-01-04 MED ORDER — CEFAZOLIN SODIUM-DEXTROSE 2-4 GM/100ML-% IV SOLN
2.0000 g | Freq: Once | INTRAVENOUS | Status: AC
  Administered 2020-01-04: 2 g via INTRAVENOUS
  Filled 2020-01-04: qty 100

## 2020-01-04 MED ORDER — LACTATED RINGERS IV SOLN: INTRAVENOUS | Status: DC | PRN

## 2020-01-04 MED ORDER — ATORVASTATIN CALCIUM 40 MG PO TABS
80.0000 mg | ORAL_TABLET | Freq: Every day | ORAL | Status: DC
  Administered 2020-01-04 – 2020-01-05 (×2): 80 mg via ORAL
  Filled 2020-01-04 (×2): qty 2

## 2020-01-04 MED ORDER — MAGNESIUM CITRATE PO SOLN: 0.5000 | Freq: Once | ORAL | Status: DC

## 2020-01-04 MED ORDER — PROPOFOL 1000 MG/100ML IV EMUL
INTRAVENOUS | Status: AC
  Filled 2020-01-04: qty 100

## 2020-01-04 MED ORDER — FENTANYL CITRATE (PF) 100 MCG/2ML IJ SOLN
INTRAMUSCULAR | Status: AC
  Administered 2020-01-04: 25 ug via INTRAVENOUS
  Filled 2020-01-04: qty 2

## 2020-01-04 MED ORDER — PROPOFOL 10 MG/ML IV BOLUS
INTRAVENOUS | Status: AC
  Filled 2020-01-04: qty 40

## 2020-01-04 MED ORDER — GEMCITABINE CHEMO FOR BLADDER INSTILLATION 2000 MG: 2000.0000 mg | Freq: Once | INTRAVENOUS | Status: DC

## 2020-01-04 MED ORDER — SUGAMMADEX SODIUM 200 MG/2ML IV SOLN
INTRAVENOUS | Status: DC | PRN
  Administered 2020-01-04: 200 mg via INTRAVENOUS

## 2020-01-04 MED ORDER — DOCUSATE SODIUM 100 MG PO CAPS
100.0000 mg | ORAL_CAPSULE | Freq: Two times a day (BID) | ORAL | Status: DC
  Administered 2020-01-04 – 2020-01-06 (×4): 100 mg via ORAL
  Filled 2020-01-04 (×4): qty 1

## 2020-01-04 MED ORDER — DEXAMETHASONE SODIUM PHOSPHATE 10 MG/ML IJ SOLN
INTRAMUSCULAR | Status: AC
  Filled 2020-01-04: qty 1

## 2020-01-04 MED ORDER — GEMCITABINE CHEMO FOR BLADDER INSTILLATION 2000 MG
2000.0000 mg | Freq: Once | INTRAVENOUS | Status: AC
  Administered 2020-01-04: 2000 mg via INTRAVESICAL
  Filled 2020-01-04: qty 2000

## 2020-01-04 MED ORDER — BUPIVACAINE LIPOSOME 1.3 % IJ SUSP
20.0000 mL | Freq: Once | INTRAMUSCULAR | Status: AC
  Administered 2020-01-04: 20 mL
  Filled 2020-01-04: qty 20

## 2020-01-04 MED ORDER — SODIUM CHLORIDE (PF) 0.9 % IJ SOLN
INTRAMUSCULAR | Status: AC
  Filled 2020-01-04: qty 20

## 2020-01-04 MED ORDER — AMLODIPINE BESYLATE 5 MG PO TABS
2.5000 mg | ORAL_TABLET | Freq: Every day | ORAL | Status: DC
  Administered 2020-01-04 – 2020-01-06 (×3): 2.5 mg via ORAL
  Filled 2020-01-04 (×3): qty 1

## 2020-01-04 MED ORDER — DIPHENHYDRAMINE HCL 50 MG/ML IJ SOLN
INTRAMUSCULAR | Status: DC | PRN
  Administered 2020-01-04: 12.5 mg via INTRAVENOUS

## 2020-01-04 MED ORDER — TRAMADOL HCL 50 MG PO TABS: 50.0000 mg | ORAL_TABLET | Freq: Four times a day (QID) | ORAL | 0 refills | Status: DC | PRN

## 2020-01-04 MED ORDER — LACTATED RINGERS IV SOLN: INTRAVENOUS | Status: DC

## 2020-01-04 MED ORDER — CHLORHEXIDINE GLUCONATE 0.12 % MT SOLN
15.0000 mL | Freq: Once | OROMUCOSAL | Status: AC
  Administered 2020-01-04: 15 mL via OROMUCOSAL

## 2020-01-04 MED ORDER — ENOXAPARIN SODIUM 40 MG/0.4ML ~~LOC~~ SOLN
40.0000 mg | SUBCUTANEOUS | Status: DC
  Administered 2020-01-05 – 2020-01-06 (×2): 40 mg via SUBCUTANEOUS
  Filled 2020-01-04 (×2): qty 0.4

## 2020-01-04 MED ORDER — DIPHENHYDRAMINE HCL 12.5 MG/5ML PO ELIX: 12.5000 mg | ORAL_SOLUTION | Freq: Four times a day (QID) | ORAL | Status: DC | PRN

## 2020-01-04 MED ORDER — BUPIVACAINE-EPINEPHRINE 0.25% -1:200000 IJ SOLN
INTRAMUSCULAR | Status: DC | PRN
  Administered 2020-01-04: 8 mL

## 2020-01-04 MED ORDER — LIDOCAINE 2% (20 MG/ML) 5 ML SYRINGE
INTRAMUSCULAR | Status: DC | PRN
  Administered 2020-01-04: 100 mg via INTRAVENOUS

## 2020-01-04 MED ORDER — ONDANSETRON HCL 4 MG/2ML IJ SOLN
4.0000 mg | INTRAMUSCULAR | Status: DC | PRN
  Administered 2020-01-04: 4 mg via INTRAVENOUS
  Filled 2020-01-04: qty 2

## 2020-01-04 MED ORDER — ROCURONIUM BROMIDE 10 MG/ML (PF) SYRINGE
PREFILLED_SYRINGE | INTRAVENOUS | Status: DC | PRN
  Administered 2020-01-04 (×3): 10 mg via INTRAVENOUS
  Administered 2020-01-04: 100 mg via INTRAVENOUS

## 2020-01-04 MED ORDER — FENTANYL CITRATE (PF) 250 MCG/5ML IJ SOLN
INTRAMUSCULAR | Status: AC
  Filled 2020-01-04: qty 5

## 2020-01-04 MED ORDER — DEXTROSE-NACL 5-0.45 % IV SOLN: INTRAVENOUS | Status: DC

## 2020-01-04 MED ORDER — METOPROLOL SUCCINATE ER 100 MG PO TB24
100.0000 mg | ORAL_TABLET | Freq: Every day | ORAL | Status: DC
  Administered 2020-01-04 – 2020-01-05 (×2): 100 mg via ORAL
  Filled 2020-01-04 (×2): qty 1

## 2020-01-04 MED ORDER — ONDANSETRON HCL 4 MG/2ML IJ SOLN
INTRAMUSCULAR | Status: DC | PRN
  Administered 2020-01-04: 4 mg via INTRAVENOUS

## 2020-01-04 MED ORDER — EPHEDRINE 5 MG/ML INJ
INTRAVENOUS | Status: AC
  Filled 2020-01-04: qty 10

## 2020-01-04 MED ORDER — ALBUMIN HUMAN 5 % IV SOLN: INTRAVENOUS | Status: DC | PRN

## 2020-01-04 MED ORDER — DIPHENHYDRAMINE HCL 50 MG/ML IJ SOLN: 12.5000 mg | Freq: Four times a day (QID) | INTRAMUSCULAR | Status: DC | PRN

## 2020-01-04 MED ORDER — ONDANSETRON HCL 4 MG/2ML IJ SOLN
INTRAMUSCULAR | Status: AC
  Filled 2020-01-04: qty 2

## 2020-01-04 MED ORDER — PROPOFOL 10 MG/ML IV BOLUS
INTRAVENOUS | Status: DC | PRN
  Administered 2020-01-04: 150 mg via INTRAVENOUS

## 2020-01-04 MED ORDER — HYDROMORPHONE HCL 1 MG/ML IJ SOLN
0.5000 mg | INTRAMUSCULAR | Status: DC | PRN
  Administered 2020-01-04: 1 mg via INTRAVENOUS
  Filled 2020-01-04: qty 1

## 2020-01-04 MED ORDER — FENTANYL CITRATE (PF) 100 MCG/2ML IJ SOLN
INTRAMUSCULAR | Status: DC | PRN
Start: 2020-01-04 — End: 2020-01-04
  Administered 2020-01-04: 100 ug via INTRAVENOUS
  Administered 2020-01-04 (×3): 50 ug via INTRAVENOUS

## 2020-01-04 MED ORDER — DEXMEDETOMIDINE (PRECEDEX) IN NS 20 MCG/5ML (4 MCG/ML) IV SYRINGE
PREFILLED_SYRINGE | INTRAVENOUS | Status: AC
  Filled 2020-01-04: qty 5

## 2020-01-04 MED ORDER — DEXAMETHASONE SODIUM PHOSPHATE 10 MG/ML IJ SOLN
INTRAMUSCULAR | Status: DC | PRN
  Administered 2020-01-04: 8 mg via INTRAVENOUS

## 2020-01-04 MED ORDER — BUPIVACAINE-EPINEPHRINE (PF) 0.25% -1:200000 IJ SOLN
INTRAMUSCULAR | Status: AC
  Filled 2020-01-04: qty 30

## 2020-01-04 MED ORDER — PROMETHAZINE HCL 25 MG/ML IJ SOLN: 6.2500 mg | INTRAMUSCULAR | Status: DC | PRN

## 2020-01-04 MED ORDER — FENTANYL CITRATE (PF) 100 MCG/2ML IJ SOLN
25.0000 ug | INTRAMUSCULAR | Status: DC | PRN
  Administered 2020-01-04: 25 ug via INTRAVENOUS

## 2020-01-04 MED ORDER — CEFAZOLIN SODIUM-DEXTROSE 1-4 GM/50ML-% IV SOLN
1.0000 g | Freq: Three times a day (TID) | INTRAVENOUS | Status: AC
  Administered 2020-01-04 – 2020-01-05 (×2): 1 g via INTRAVENOUS
  Filled 2020-01-04 (×2): qty 50

## 2020-01-04 MED ORDER — PROPOFOL 500 MG/50ML IV EMUL
INTRAVENOUS | Status: DC | PRN
  Administered 2020-01-04: 25 ug/kg/min via INTRAVENOUS

## 2020-01-04 MED ORDER — ACETAMINOPHEN 500 MG PO TABS
1000.0000 mg | ORAL_TABLET | Freq: Once | ORAL | Status: AC
  Administered 2020-01-04: 1000 mg via ORAL
  Filled 2020-01-04: qty 2

## 2020-01-04 MED ORDER — ROCURONIUM BROMIDE 10 MG/ML (PF) SYRINGE
PREFILLED_SYRINGE | INTRAVENOUS | Status: AC
  Filled 2020-01-04: qty 10

## 2020-01-04 MED ORDER — ALBUMIN HUMAN 5 % IV SOLN
INTRAVENOUS | Status: AC
  Filled 2020-01-04: qty 250

## 2020-01-04 MED ORDER — ACETAMINOPHEN 10 MG/ML IV SOLN
1000.0000 mg | Freq: Four times a day (QID) | INTRAVENOUS | Status: AC
  Administered 2020-01-04 – 2020-01-05 (×4): 1000 mg via INTRAVENOUS
  Filled 2020-01-04 (×4): qty 100

## 2020-01-04 MED ORDER — MIDAZOLAM HCL 5 MG/5ML IJ SOLN
INTRAMUSCULAR | Status: DC | PRN
  Administered 2020-01-04: 1 mg via INTRAVENOUS

## 2020-01-04 MED ORDER — DIPHENHYDRAMINE HCL 50 MG/ML IJ SOLN
INTRAMUSCULAR | Status: AC
  Filled 2020-01-04: qty 1

## 2020-01-04 MED ORDER — LACTATED RINGERS IR SOLN
Status: DC | PRN
  Administered 2020-01-04: 3000 mL

## 2020-01-04 MED ORDER — SODIUM CHLORIDE (PF) 0.9 % IJ SOLN
INTRAMUSCULAR | Status: DC | PRN
  Administered 2020-01-04: 20 mL

## 2020-01-04 MED ORDER — PHENYLEPHRINE HCL-NACL 10-0.9 MG/250ML-% IV SOLN
INTRAVENOUS | Status: DC | PRN
  Administered 2020-01-04: 15 ug/min via INTRAVENOUS

## 2020-01-04 MED ORDER — STERILE WATER FOR IRRIGATION IR SOLN
Status: DC | PRN
  Administered 2020-01-04: 1000 mL

## 2020-01-04 MED ORDER — MIDAZOLAM HCL 2 MG/2ML IJ SOLN
INTRAMUSCULAR | Status: AC
  Filled 2020-01-04: qty 2

## 2020-01-04 MED ORDER — DEXMEDETOMIDINE (PRECEDEX) IN NS 20 MCG/5ML (4 MCG/ML) IV SYRINGE
PREFILLED_SYRINGE | INTRAVENOUS | Status: DC | PRN
  Administered 2020-01-04: 8 ug via INTRAVENOUS
  Administered 2020-01-04 (×2): 4 ug via INTRAVENOUS

## 2020-01-04 MED ORDER — ORAL CARE MOUTH RINSE: 15.0000 mL | Freq: Once | OROMUCOSAL | Status: AC

## 2020-01-04 MED ORDER — LIDOCAINE 2% (20 MG/ML) 5 ML SYRINGE
INTRAMUSCULAR | Status: AC
  Filled 2020-01-04: qty 5

## 2020-01-04 MED FILL — traMADol HCL 50 MG TABS: 50 | 3 days supply | Qty: 20 | Fill #0

## 2020-01-04 SURGICAL SUPPLY — 64 items
BAG LAPAROSCOPIC 12 15 PORT 16 (BASKET) ×1 IMPLANT
BAG RETRIEVAL 12/15 (BASKET) ×2
BAG RETRIEVAL 12/15MM (BASKET) ×1
CATH FOLEY 3WAY  5CC 18FR (CATHETERS) ×2
CATH FOLEY 3WAY 5CC 18FR (CATHETERS) ×1 IMPLANT
CHLORAPREP W/TINT 26 (MISCELLANEOUS) ×3 IMPLANT
CLIP VESOLOCK LG 6/CT PURPLE (CLIP) ×6 IMPLANT
CLIP VESOLOCK MED LG 6/CT (CLIP) ×3 IMPLANT
COVER SURGICAL LIGHT HANDLE (MISCELLANEOUS) ×3 IMPLANT
COVER TIP SHEARS 8 DVNC (MISCELLANEOUS) ×1 IMPLANT
COVER TIP SHEARS 8MM DA VINCI (MISCELLANEOUS) ×2
COVER WAND RF STERILE (DRAPES) IMPLANT
CUTTER FLEX LINEAR 45M (STAPLE) ×3 IMPLANT
DECANTER SPIKE VIAL GLASS SM (MISCELLANEOUS) ×3 IMPLANT
DERMABOND ADVANCED (GAUZE/BANDAGES/DRESSINGS) ×2
DERMABOND ADVANCED .7 DNX12 (GAUZE/BANDAGES/DRESSINGS) ×1 IMPLANT
DRAIN CHANNEL 15F RND FF 3/16 (WOUND CARE) ×3 IMPLANT
DRAPE ARM DVNC X/XI (DISPOSABLE) ×4 IMPLANT
DRAPE COLUMN DVNC XI (DISPOSABLE) ×1 IMPLANT
DRAPE DA VINCI XI ARM (DISPOSABLE) ×8
DRAPE DA VINCI XI COLUMN (DISPOSABLE) ×2
DRAPE INCISE IOBAN 66X45 STRL (DRAPES) ×3 IMPLANT
DRAPE LAPAROSCOPIC ABDOMINAL (DRAPES) ×3 IMPLANT
DRAPE SHEET LG 3/4 BI-LAMINATE (DRAPES) ×3 IMPLANT
DRSG TEGADERM 4X4.75 (GAUZE/BANDAGES/DRESSINGS) ×3 IMPLANT
ELECT PENCIL ROCKER SW 15FT (MISCELLANEOUS) ×3 IMPLANT
ELECT REM PT RETURN 15FT ADLT (MISCELLANEOUS) ×3 IMPLANT
EVACUATOR SILICONE 100CC (DRAIN) ×3 IMPLANT
GAUZE SPONGE 2X2 8PLY STRL LF (GAUZE/BANDAGES/DRESSINGS) ×1 IMPLANT
GLOVE BIO SURGEON STRL SZ 6.5 (GLOVE) ×2 IMPLANT
GLOVE BIO SURGEONS STRL SZ 6.5 (GLOVE) ×1
GLOVE BIOGEL M STRL SZ7.5 (GLOVE) ×6 IMPLANT
GOWN STRL REUS W/TWL LRG LVL3 (GOWN DISPOSABLE) ×9 IMPLANT
IRRIG SUCT STRYKERFLOW 2 WTIP (MISCELLANEOUS)
IRRIGATION SUCT STRKRFLW 2 WTP (MISCELLANEOUS) IMPLANT
KIT BASIN OR (CUSTOM PROCEDURE TRAY) ×3 IMPLANT
KIT TURNOVER KIT A (KITS) IMPLANT
NS IRRIG 1000ML POUR BTL (IV SOLUTION) ×3 IMPLANT
PENCIL SMOKE EVACUATOR (MISCELLANEOUS) IMPLANT
PLUG CATH AND CAP STER (CATHETERS) ×3 IMPLANT
PROTECTOR NERVE ULNAR (MISCELLANEOUS) ×6 IMPLANT
RELOAD 45 VASCULAR/THIN (ENDOMECHANICALS) ×3 IMPLANT
SEAL CANN UNIV 5-8 DVNC XI (MISCELLANEOUS) ×6 IMPLANT
SEAL XI 5MM-8MM UNIVERSAL (MISCELLANEOUS) ×12
SET IRRIG Y TYPE TUR BLADDER L (SET/KITS/TRAYS/PACK) ×6 IMPLANT
SET TUBE SMOKE EVAC HIGH FLOW (TUBING) ×3 IMPLANT
SOLUTION ELECTROLUBE (MISCELLANEOUS) ×3 IMPLANT
SPONGE GAUZE 2X2 STER 10/PKG (GAUZE/BANDAGES/DRESSINGS) ×2
SUT ETHILON 3 0 PS 1 (SUTURE) ×3 IMPLANT
SUT MNCRL AB 4-0 PS2 18 (SUTURE) ×6 IMPLANT
SUT PDS AB 1 CTX 36 (SUTURE) ×6 IMPLANT
SUT V-LOC BARB 180 2/0GR6 GS22 (SUTURE)
SUT VIC AB 0 CT1 27 (SUTURE) ×2
SUT VIC AB 0 CT1 27XBRD ANTBC (SUTURE) ×1 IMPLANT
SUT VICRYL 0 UR6 27IN ABS (SUTURE) ×3 IMPLANT
SUT VLOC BARB 180 ABS3/0GR12 (SUTURE) ×3
SUTURE V-LC BRB 180 2/0GR6GS22 (SUTURE) IMPLANT
SUTURE VLOC BRB 180 ABS3/0GR12 (SUTURE) ×1 IMPLANT
TOWEL OR 17X26 10 PK STRL BLUE (TOWEL DISPOSABLE) ×3 IMPLANT
TOWEL OR NON WOVEN STRL DISP B (DISPOSABLE) ×3 IMPLANT
TRAY FOLEY MTR SLVR 16FR STAT (SET/KITS/TRAYS/PACK) ×3 IMPLANT
TRAY LAPAROSCOPIC (CUSTOM PROCEDURE TRAY) ×3 IMPLANT
TROCAR XCEL 12X100 BLDLESS (ENDOMECHANICALS) ×3 IMPLANT
WATER STERILE IRR 1000ML POUR (IV SOLUTION) ×6 IMPLANT

## 2020-01-04 NOTE — Progress Notes (Signed)
Patient ID: Lawrence Hall, male   DOB: [birth year], 74 y.o.   MRN: [id number]  Post-op note  Subjective: The patient is doing well.  No complaints. Pain controlled.  Objective: Vital signs in last 24 hours: Temp:  [97.7 F (36.5 C)-98.6 F (37 C)] 97.7 F (36.5 C) (09/16 1449) Pulse Rate:  [65-77] 73 (09/16 1449) Resp:  [16-22] 20 (09/16 1449) BP: (123-161)/(65-91) 148/84 (09/16 1449) SpO2:  [95 %-100 %] 96 % (09/16 1449) Weight:  [89.8 kg] 89.8 kg (09/16 0621)  Intake/Output from previous day: No intake/output data recorded. Intake/Output this shift: Total I/O In: 2750 [I.V.:2500; IV Piggyback:250] Out: 575 [Urine:475; Blood:100]  Physical Exam:  General: Alert and oriented. Abdomen: Soft, Nondistended. Incisions: Clean and dry. GU: Urine clear.  Lab Results: Recent Labs    01/04/20 1218  HGB 9.7*  HCT 29.3*    Assessment/Plan: POD#0   1) Continue to monitor, ambulate, IS   Lawrence Hall. MD   LOS: 0 days   Dutch Gray 01/04/2020, 4:37 PM

## 2020-01-04 NOTE — Transfer of Care (Signed)
Immediate Anesthesia Transfer of Care Note  Patient: Lawrence Hall  Procedure(s) Performed: Procedure(s) with comments: XI ROBOT ASSITED LAPAROSCOPIC NEPHROURETERECTOMY (Left) - NEEDS 240 MIN FOR PROCEDURE  Patient Location: PACU  Anesthesia Type:General  Level of Consciousness:  sedated, patient cooperative and responds to stimulation  Airway & Oxygen Therapy:Patient Spontanous Breathing and Patient connected to face mask oxgen  Post-op Assessment:  Report given to PACU RN and Post -op Vital signs reviewed and stable  Post vital signs:  Reviewed and stable  Last Vitals:  Vitals:   01/04/20 0536  BP: 123/65  Pulse: 71  Resp: 18  Temp: 37 C  SpO2: 15%    Complications: No apparent anesthesia complications

## 2020-01-04 NOTE — Anesthesia Procedure Notes (Signed)
Procedure Name: Intubation Date/Time: 01/04/2020 7:49 AM Performed by: Lavina Hamman, CRNA Pre-anesthesia Checklist: Patient identified, Emergency Drugs available, Suction available, Patient being monitored and Timeout performed Patient Re-evaluated:Patient Re-evaluated prior to induction Oxygen Delivery Method: Circle system utilized Preoxygenation: Pre-oxygenation with 100% oxygen Induction Type: IV induction Ventilation: Mask ventilation without difficulty Laryngoscope Size: Mac and 4 Grade View: Grade II Tube type: Oral Tube size: 7.5 mm Number of attempts: 1 Airway Equipment and Method: Stylet Placement Confirmation: ETT inserted through vocal cords under direct vision,  positive ETCO2,  CO2 detector and breath sounds checked- equal and bilateral Secured at: 23 cm Tube secured with: Tape Dental Injury: Teeth and Oropharynx as per pre-operative assessment  Comments: ATOI

## 2020-01-04 NOTE — Anesthesia Postprocedure Evaluation (Signed)
Anesthesia Post Note  Patient: Lawrence Hall  Procedure(s) Performed: XI ROBOT ASSITED LAPAROSCOPIC NEPHROURETERECTOMY (Left )     Patient location during evaluation: PACU Anesthesia Type: General Level of consciousness: sedated Pain management: pain level controlled Vital Signs Assessment: post-procedure vital signs reviewed and stable Respiratory status: spontaneous breathing and respiratory function stable Cardiovascular status: stable Postop Assessment: no apparent nausea or vomiting Anesthetic complications: no   No complications documented.  Last Vitals:  Vitals:   01/04/20 1300 01/04/20 1315  BP: (!) 155/83 (!) 161/91  Pulse: 71 72  Resp: 16 (!) 22  Temp:    SpO2: 99% 100%    Last Pain:  Vitals:   01/04/20 1315  TempSrc:   PainSc: 4                  Ventura Hollenbeck DANIEL

## 2020-01-04 NOTE — Interval H&P Note (Signed)
History and Physical Interval Note:  01/04/2020 6:47 AM  Lawrence Hall  has presented today for surgery, with the diagnosis of UROTHELIAL CARCINOMA OF LEFT KIDNEY.  The various methods of treatment have been discussed with the patient and family. After consideration of risks, benefits and other options for treatment, the patient has consented to  Procedure(s) with comments: XI ROBOT ASSITED LAPAROSCOPIC NEPHROURETERECTOMY (Left) - NEEDS 240 MIN FOR PROCEDURE as a surgical intervention.  The patient's history has been reviewed, patient examined, no change in status, stable for surgery.  I have reviewed the patient's chart and labs.  Questions were answered to the patient's satisfaction.     [employer]

## 2020-01-04 NOTE — Discharge Instructions (Signed)

## 2020-01-04 NOTE — Op Note (Signed)
Preoperative diagnosis: Urothelial carcinoma of the left kidney  Postoperative diagnosis: Urothelial carcinoma of the kidney  Procedure:  1. Left robotic-assisted laparoscopic nephroureterectomy 2. Robotic-assisted retroperitoneal lymphadnectomy  Surgeon: Pryor Curia. M.D.   Assistant(s): Debbrah Alar, PA-C  An assistant was required for this surgical procedure.  The duties of the assistant included but were not limited to suctioning, passing suture, camera manipulation, retraction. This procedure would not be able to be performed without an Environmental consultant.  Resident: Dr. Celene Squibb  Anesthesia: General   Complications: None  EBL: 100  IVF: 2500 mL crystalloid, 250 cc colloid  Specimens:  1. Left kidney, adrenal gland, ureter, and bladder cuff 2. Retroperitoneal lymph nodes  Disposition of specimens: Pathology   Drains:  1. # 15 Blake pelvic drain 2. 18 Fr Foley catheter  Indication:   Lawrence Hall is a 74 y.o. patient with upper tract urothelial carcinoma of the left kidney.  He was treated with neoadjuvant chemotherapy with a positive response. After a thorough review of the management options, he elected to proceed with surgical treatment and the above procedure. We have discussed the potential benefits and risks of the procedure, side effects of the proposed treatment, the likelihood of the patient achieving the goals of the procedure, and any potential problems that might occur during the procedure or recuperation. Informed consent has been obtained.   Description of procedure:   The patient was taken to the operating room and a general anesthetic was administered. The patient was given preoperative antibiotics, placed in the left modified flank position with care to pad all potential pressure points, and prepped and draped in the usual sterile fashion. Next a preoperative timeout was performed.   A site was selected on the left side of the umbilicus for  placement of the assistant port. This was placed using a standard open Hassan technique which allowed entry into the peritoneal cavity under direct vision and without difficulty. A 12 mm port was placed and a pneumoperitoneum established. The camera was then used to inspect the abdomen and there was no evidence of any intra-abdominal injuries or other abnormalities. The remaining abdominal ports were then placed. 8 mm robotic ports were placed in the left upper quadrant, left lower quadrant, and far left lateral abdominal wall. An 8 mm camera port was placed just lateral to the rectus muscle at the level of the umbilicus on the left side. All ports were placed under direct vision without difficulty.   Utilizing the cautery scissors, some adhesions between the colon and abdominal wall were taken down. The white line of Toldt was incised allowing the colon to be mobilized medially and the plane between the mesocolon and the anterior layer of Gerota's fascia to be developed and the kidney to be exposed. The ureter and gonadal vein were identified inferiorly and the ureter was lifted anteriorly off the psoas muscle. Dissection proceeded superiorly along the gonadal vein until it entered the inferior vena cava. The gonadal vein was divided after ligation with multiple Weck clips. The renal hilum was then encountered. There was a single renal vein and a single renal artery just superior to the renal vein.  Gerota's fascia was not entered due to concern the tumor may be invading the adrenal gland.  Dissection proceeded medial to the adrenal gland to remove it with the kidney.  The hepatorenal ligaments were divided. The lateral attachments to the kidney were then divided allowing the kidney to be freely mobile. Dissection then proceeded inferiorly  along the ureter toward the pelvis.  The gonadal vein was ligated and divided between Weck clips. The ureter was dissected free down the the common iliac vessels.   At this  point attention returned to the renal fossa. Hemostasis was ensured.  I then proceeded with a retroperitoneal lymphadenopathy.  The patient did have a left-sided inferior vena cava.  I therefore performed a pericaval lymph node dissection using bipolar cautery and Weck clips for lymph stasis.  The lymph node packet extending from the common iliac vein up to the renal hilum and from the left-sided inferior vena cava to the ureter was removed.   Attention then focused on the pelvic dissection. The robotic cart was undocked. An additional 8 mm robotic port was placed in the lower midline and the cart was redocked over the patient's hip. The ureter was further dissected freely into the pelvis after the overlying peritoneum was incised and the small vessels feeding the ureter was ligated with bipolar energy. A Weck clip was placed on the ureter distally to avoid any risk of tumor spillage. The detrusor muscle fibers were divided and the mucosa could be visualized. A 3-0 V-lock suture was secured at the lateral side of the margin of resection in the bladder. The mucosa was then entered and the ureter and a surrounding bladder cuff were removed. The cystotomy was then closed with the v-lock suture in a running fashion with a second imbricating layer as well. The bladder was then filled with saline and there was no evidence for urine leak.   A # 15 Blake drain was then brought through the lateral lower port site and positioned in the perivesical space. The specimen was placed in an Endocatch II bag and later removed through a lower midline incision extending from the robotic port site in that area. The 12 mm upper midline incision closed with two running # 1 PDS sutures. All other laparoscopic/robotic ports were removed under direct vision and the pneumoperitoneum let down with inspection of the operative field performed and hemostasis again confirmed. All incision sites were then injected with local anesthetic and  reapproximated at the skin level with 4-0 monocryl subcuticular closures. Dermabond was applied to the skin. The patient tolerated the procedure well and without complications. The patient was able to be extubated and transferred to the recovery unit in satisfactory condition.    Pryor Curia MD

## 2020-01-05 ENCOUNTER — Encounter (HOSPITAL_COMMUNITY): Payer: Self-pay | Admitting: Urology

## 2020-01-05 LAB — BASIC METABOLIC PANEL
Anion gap: 9 (ref 5–15)
BUN: 25 mg/dL — ABNORMAL HIGH (ref 8–23)
CO2: 26 mmol/L (ref 22–32)
Calcium: 8.5 mg/dL — ABNORMAL LOW (ref 8.9–10.3)
Chloride: 103 mmol/L (ref 98–111)
Creatinine, Ser: 1.74 mg/dL — ABNORMAL HIGH (ref 0.61–1.24)
GFR calc Af Amer: 43 mL/min — ABNORMAL LOW (ref >60)
GFR calc non Af Amer: 37 mL/min — ABNORMAL LOW (ref >60)
Glucose, Bld: 137 mg/dL — ABNORMAL HIGH (ref 70–99)
Potassium: 4.4 mmol/L (ref 3.5–5.1)
Sodium: 138 mmol/L (ref 135–145)

## 2020-01-05 LAB — CREATININE, FLUID (PLEURAL, PERITONEAL, JP DRAINAGE): Creat, Fluid: 1.6 mg/dL

## 2020-01-05 LAB — HEMOGLOBIN AND HEMATOCRIT, BLOOD
HCT: 27.2 % — ABNORMAL LOW (ref 39.0–52.0)
Hemoglobin: 9.1 g/dL — ABNORMAL LOW (ref 13.0–17.0)

## 2020-01-05 MED ORDER — BISACODYL 10 MG RE SUPP
10.0000 mg | Freq: Once | RECTAL | Status: AC
  Administered 2020-01-05: 10 mg via RECTAL
  Filled 2020-01-05: qty 1

## 2020-01-05 MED ORDER — TRAMADOL HCL 50 MG PO TABS
50.0000 mg | ORAL_TABLET | Freq: Four times a day (QID) | ORAL | Status: DC | PRN
  Administered 2020-01-05 – 2020-01-06 (×2): 50 mg via ORAL
  Filled 2020-01-05 (×2): qty 1

## 2020-01-05 MED ORDER — CHLORHEXIDINE GLUCONATE CLOTH 2 % EX PADS
6.0000 | MEDICATED_PAD | Freq: Every day | CUTANEOUS | Status: DC
  Administered 2020-01-05 – 2020-01-06 (×2): 6 via TOPICAL

## 2020-01-05 NOTE — Progress Notes (Signed)
Patient ID: Lawrence Hall, male   DOB: [birth year], 74 y.o.   MRN: [id number]  1 Day Post-Op Subjective: Emesis x 2 last night mostly associated with IV pain medication.  He has ambulated and passed a small amount of flatus this morning.  Pain controlled.  Objective: Vital signs in last 24 hours: Temp:  [97.6 F (36.4 C)-98 F (36.7 C)] 97.6 F (36.4 C) (09/17 0538) Pulse Rate:  [62-80] 62 (09/17 0538) Resp:  [16-22] 16 (09/17 0538) BP: (113-161)/(60-91) 120/60 (09/17 0538) SpO2:  [95 %-100 %] 95 % (09/17 0538) Weight:  [92.8 kg] 92.8 kg (09/16 1645)  Intake/Output from previous day: 09/16 0701 - 09/17 0700 In: 4315.7 [P.O.:240; I.V.:3525.9; IV Piggyback:549.8] Out: 2085 [Urine:1925; Drains:60; Blood:100] Intake/Output this shift: No intake/output data recorded.  Physical Exam:  General: Alert and oriented CV: RRR Lungs: Clear Abdomen: Soft, ND, positive BS, drain with minimal serosanguinous drainage Incisions: C/D/I GU: Urine clear Ext: NT, No erythema  Lab Results: Recent Labs    01/04/20 1218 01/05/20 0455  HGB 9.7* 9.1*  HCT 29.3* 27.2*   BMET Recent Labs    01/04/20 1218 01/05/20 0455  NA 140 138  K 4.3 4.4  CL 106 103  CO2 25 26  GLUCOSE 143* 137*  BUN 25* 25*  CREATININE 1.49* 1.74*  CALCIUM 8.8* 8.5*     Studies/Results: No results found.  Assessment/Plan: POD # 1 s/p left RAL nephroureterectomy  - Ambulate, IS - Oral pain meds - Advance diet as tolerated - Monitor renal function - Decreased IVF - Check drain Cr - Possible d/c later today or tomorrw    LOS: 1 day   Dutch Gray 01/05/2020, 7:43 AM

## 2020-01-06 MED ORDER — CEPHALEXIN 500 MG PO CAPS: 500.0000 mg | ORAL_CAPSULE | Freq: Two times a day (BID) | ORAL | 0 refills | Status: DC

## 2020-01-06 MED FILL — CEPHALEXIN 500 MG CAPSULE: 500 | 2 days supply | Qty: 5 | Fill #0

## 2020-01-06 NOTE — Progress Notes (Signed)
AVS given to patient and explained at the bedside. Medications and follow up appointments have been explained with pt verbalizing understanding. Leg bag and standard urinary drainage bag provided for home.

## 2020-01-06 NOTE — Discharge Summary (Signed)
Physician Discharge Summary  Patient ID: Lawrence Hall MRN: [id number] DOB/AGE: 05-04-1945 74 y.o.  Admit date: 01/04/2020 Discharge date: 01/06/2020  Admission Diagnoses: Left Renal Pelvis Cancer  Discharge Diagnoses:  Active Problems:   Malignant tumor of renal pelvis, left [facility])   Discharged Condition: good  Hospital Course: Pt under went LEFT robotic nephroureterectomy on 01/04/20 without acute complications. He was admitted to 4th floor Urology service post-op. By the AM of POD 2, the day of discharge, he is ambulatory, pain controlled on PO meds, maintaining PO nutrition / hydration and felt to be adequate for discharge. JP removed as output scant. Path pending. Cr 1.7, Hgb 9.1. He will keep foley at discharge.  Consults: None  Significant Diagnostic Studies: labs: as per above.  Treatments: surgery: as per above.   Discharge Exam: Blood pressure (!) 147/77, pulse 74, temperature 98.6 F (37 C), temperature source Oral, resp. rate 20, height 6' (1.829 m), weight 92.8 kg, SpO2 92 %. General appearance: alert, cooperative and wife at bedside. Both very pleasant.  Eyes: negative Nose: Nares normal. Septum midline. Mucosa normal. No drainage or sinus tenderness. Throat: lips, mucosa, and tongue normal; teeth and gums normal Neck: supple, symmetrical, trachea midline Back: symmetric, no curvature. ROM normal. No CVA tenderness. Resp: Non-labored on room air.  Cardio: Nl rate GI: soft, non-tender; bowel sounds normal; no masses,  no organomegaly and recent port and extraction sites c/d/i. NO distention. NO r/g. JP site bandage removed adn completely dry skin.  Male genitalia: normal Extremities: extremities normal, atraumatic, no cyanosis or edema Skin: Skin color, texture, turgor normal. No rashes or lesions Lymph nodes: Cervical, supraclavicular, and axillary nodes normal. Neurologic: Grossly normal  Foley in place with non-foul yellow urine  Disposition:  HOME   Allergies  as of 01/06/2020      Reactions   Other Itching, Other (See Comments)   Pet dander, seasonal allergies = Runny nose, itchy eyes      Medication List    STOP taking these medications   aspirin EC 81 MG tablet   Vitamin D-3 125 MCG (5000 UT) Tabs   vitamin E 180 MG (400 UNITS) capsule     TAKE these medications   amLODipine 2.5 MG tablet Commonly known as: NORVASC Take 1 tablet (2.5 mg total) by mouth daily. What changed: when to take this   atorvastatin 80 MG tablet Commonly known as: LIPITOR TAKE 1 TABLET ONCE DAILY AT 6 PM. What changed: See the new instructions.   cephALEXin 500 MG capsule Commonly known as: Keflex Take 1 capsule (500 mg total) by mouth 2 (two) times daily. Begin day before next Urology appointment.   chlorhexidine 0.12 % solution Commonly known as: PERIDEX Rinse with 15 mls twice daily for 30 seconds. Use after breakfast and at bedtime. Spit out excess. Do not swallow. What changed:   how much to take  how to take this  when to take this  additional instructions   metoprolol succinate 100 MG 24 hr tablet Commonly known as: TOPROL-XL TAKE 1 TABLET AT BEDTIME. TAKE WITH OR IMMEDIATELY FOLLOWING A MEAL. What changed: See the new instructions.   nitroGLYCERIN 0.4 MG SL tablet Commonly known as: NITROSTAT 1 TAB UNDER TONGUE AS NEEDED FOR CHEST PAIN. MAY REPEAT EVERY 5 MIN FOR A TOTAL OF 3 DOSES. What changed: See the new instructions.   prochlorperazine 10 MG tablet Commonly known as: COMPAZINE Take 10 mg by mouth every 6 (six) hours as needed for nausea or vomiting.  traMADol 50 MG tablet Commonly known as: Ultram Take 1-2 tablets (50-100 mg total) by mouth every 6 (six) hours as needed for moderate pain or severe pain.   traZODone 50 MG tablet Commonly known as: DESYREL 1/2-1 tablet at bedtime What changed:   how much to take  how to take this  when to take this  reasons to take this  additional instructions        Follow-up Information    Raynelle Bring, MD On 01/09/2020.   Specialty: Urology Why: at 8:45 Contact information: [address] [id number] [phone]               Signed: Alexis Frock 01/06/2020, 11:11 AM

## 2020-01-08 ENCOUNTER — Other Ambulatory Visit: Payer: Self-pay | Admitting: Cardiovascular Disease

## 2020-01-08 LAB — SURGICAL PATHOLOGY

## 2020-01-09 DIAGNOSIS — C652 Malignant neoplasm of left renal pelvis: Secondary | ICD-10-CM | POA: Diagnosis not present

## 2020-01-22 ENCOUNTER — Telehealth: Payer: Self-pay | Admitting: *Deleted

## 2020-01-22 NOTE — Telephone Encounter (Signed)
Patient called. Received chemotherapy during summer after diagnosis of bladder cancer. Had Left  nephroureterectomy on 01/04/20 by Dr. Alinda Money. Patient has appt with Dr. Alen Blew 11/17. He wants to know if he will be prescribed more chemotherapy at that time - "6 weeks is a long time to wait to hear that". He states he plans to discuss with Dr. Alen Blew at appointment 11/17, he just wants to have an idea of the plan.  Message routed to Dr. Alen Blew.

## 2020-01-23 NOTE — Telephone Encounter (Signed)
No additional chemotherapy needed. Will discuss next visit.

## 2020-01-24 ENCOUNTER — Ambulatory Visit (INDEPENDENT_AMBULATORY_CARE_PROVIDER_SITE_OTHER): Payer: PPO | Admitting: Family Medicine

## 2020-01-24 ENCOUNTER — Other Ambulatory Visit: Payer: Self-pay

## 2020-01-24 ENCOUNTER — Encounter: Payer: Self-pay | Admitting: Family Medicine

## 2020-01-24 VITALS — BP 120/62 | HR 63 | Temp 97.6°F | Ht 71.0 in | Wt 195.2 lb

## 2020-01-24 DIAGNOSIS — I10 Essential (primary) hypertension: Secondary | ICD-10-CM | POA: Diagnosis not present

## 2020-01-24 DIAGNOSIS — Z23 Encounter for immunization: Secondary | ICD-10-CM

## 2020-01-24 DIAGNOSIS — J4520 Mild intermittent asthma, uncomplicated: Secondary | ICD-10-CM

## 2020-01-24 DIAGNOSIS — Z0000 Encounter for general adult medical examination without abnormal findings: Secondary | ICD-10-CM

## 2020-01-24 DIAGNOSIS — I2510 Atherosclerotic heart disease of native coronary artery without angina pectoris: Secondary | ICD-10-CM | POA: Diagnosis not present

## 2020-01-24 DIAGNOSIS — E785 Hyperlipidemia, unspecified: Secondary | ICD-10-CM

## 2020-01-24 DIAGNOSIS — C652 Malignant neoplasm of left renal pelvis: Secondary | ICD-10-CM | POA: Diagnosis not present

## 2020-01-24 MED ORDER — SHINGRIX 50 MCG/0.5ML IM SUSR: 0.5000 mL | Freq: Once | INTRAMUSCULAR | 0 refills | Status: AC

## 2020-01-24 MED ORDER — ASPIRIN EC 81 MG PO TBEC: 81.0000 mg | DELAYED_RELEASE_TABLET | Freq: Every day | ORAL | 11 refills | Status: DC

## 2020-01-24 NOTE — Progress Notes (Signed)
Lawrence Hall DOB: [birth year] Encounter date: 01/24/2020  This is a 74 y.o. male who presents for complete physical   History of present illness/Additional concerns: He has been following up regularly with nephrology.  He was admitted on 01/04/2020 and discharged on 01/06/2020 for left robotic nephro ureterectomy.  Pathology revealed metastatic urothelial carcinoma in 1 of 5 lymph nodes, infiltrating high-grade urothelial carcinoma of the renal pelvis with tumor invasion through the kidney into the perinephric fat and renal sinus as well as lymphovascular invasion however all margins were negative for tumor. 2 weeks after treatment they did follow up bloodwork.   8/6 was last chemo treatment.   Has appointment on 17th at cancer center to get port flushed. He thinks that he is going to have port left for potential future use.   On 1/20 he follows with Dr. Alinda Money.   Working on preventative care, proper immune system. Eating healthy.   Not using tramadol.   Hasn't checked bp at home in awhile. Taking all meds in evening.  Amlodipine, troprol at night as well as lipitor.   Starting to get back into exercise; was very tired after surgery.    Past Medical History:  Diagnosis Date  . Arthritis    mild hands  . Asthma    pt denies 4/21   . Chest pain 07/25/2012  . Coronary artery disease   . Dysrhythmia    PAC'S, hx of atrial fib after OHS 04/2018  . ED (erectile dysfunction)   . H/O hiatal hernia   . Heart murmur   . Hypercholesterolemia   . Hypertension    MARGINALLY HIGH - NO MEDS -MONITORS  . Myocardial infarction ([place])    2017   . Pneumonia    hx of 2018   . PONV (postoperative nausea and vomiting)   . Prostate cancer ([place]) 03/27/2011   prostate bx=adenocarcinoma,  . Skin cancer 2008   basal cell face /removed  . Sleep apnea    borderline sleep apnea per patient no devices   . Urothelial cancer [facility])    Past Surgical History:  Procedure Laterality Date  . AORTIC VALVE  REPLACEMENT N/A 04/29/2018   Procedure: AORTIC VALVE REPLACEMENT (AVR) using INSPIRIS RESILIA  AORTIC VALVE 73mm;  Surgeon: Ivin Poot, MD;  Location: [place];  Service: Open Heart Surgery;  Laterality: N/A;  . basal cell cancer  2008   inside nose  . CARDIAC CATHETERIZATION N/A 06/03/2015   Procedure: Left Heart Cath and Coronary Angiography;  Surgeon: [name] Sine, MD;  Location: [place] CV LAB;  Service: Cardiovascular;  Laterality: N/A;  . CATARACT EXTRACTION, BILATERAL  2007  . CORONARY ARTERY BYPASS GRAFT N/A 04/29/2018   Procedure: CORONARY ARTERY BYPASS GRAFTING (CABG) X 3; Using Left Internal Mammary Artery (LIMA) and Right Leg Greater Saphenous Vein Graft (SVG);  LIMA to LAD, SVG to OM1, SVG to RCA,;  Surgeon: Ivin Poot, MD;  Location: [place];  Service: Open Heart Surgery;  Laterality: N/A;  . CYSTOSCOPY/RETROGRADE/URETEROSCOPY Left 08/21/2019   Procedure: CYSTOSCOPY/RETROGRADE/URETEROSCOPY/  LEFT BIOPSY/BRUSH BIOPSY/RENAL WASHINGS/ AND STENT PLACEMENT;  Surgeon: Raynelle Bring, MD;  Location: [facility];  Service: Urology;  Laterality: Left;  . IR IMAGING GUIDED PORT INSERTION  09/28/2019  . RIGHT/LEFT HEART CATH AND CORONARY ANGIOGRAPHY N/A 04/26/2018   Procedure: RIGHT/LEFT HEART CATH AND CORONARY ANGIOGRAPHY;  Surgeon: [name] Sine, MD;  Location: [place] CV LAB;  Service: Cardiovascular;  Laterality: N/A;  . ROBOT ASSISTED LAPAROSCOPIC RADICAL PROSTATECTOMY  08/10/2011   Procedure: ROBOTIC ASSISTED LAPAROSCOPIC RADICAL PROSTATECTOMY LEVEL 2;  Surgeon: Dutch Gray, MD;  Location: [facility];  Service: Urology;  Laterality: N/A;  . ROBOT ASSITED LAPAROSCOPIC NEPHROURETERECTOMY Left 01/04/2020   Procedure: XI ROBOT ASSITED LAPAROSCOPIC NEPHROURETERECTOMY;  Surgeon: Raynelle Bring, MD;  Location: [facility];  Service: Urology;  Laterality: Left;  NEEDS 240 MIN FOR PROCEDURE  . TEE WITHOUT CARDIOVERSION N/A 04/29/2018   Procedure: TRANSESOPHAGEAL ECHOCARDIOGRAM (TEE);  Surgeon: Prescott Gum,  Collier Salina, MD;  Location: [place];  Service: Open Heart Surgery;  Laterality: N/A;  . THYROID SURGERY  infant   uncertain details; no thyroid replacement listed  . WISDOM TOOTH EXTRACTION     Allergies  Allergen Reactions  . Other Itching and Other (See Comments)    Pet dander, seasonal allergies = Runny nose, itchy eyes   Current Meds  Medication Sig  . atorvastatin (LIPITOR) 80 MG tablet TAKE 1 TABLET ONCE DAILY AT 6 PM. (Patient taking differently: Take 80 mg by mouth daily at 6 PM. )  . metoprolol succinate (TOPROL-XL) 100 MG 24 hr tablet Take 1 tablet (100 mg total) by mouth at bedtime.  . nitroGLYCERIN (NITROSTAT) 0.4 MG SL tablet 1 TAB UNDER TONGUE AS NEEDED FOR CHEST PAIN. MAY REPEAT EVERY 5 MIN FOR A TOTAL OF 3 DOSES. (Patient taking differently: Place 0.4 mg under the tongue every 5 (five) minutes as needed for chest pain. )  . [DISCONTINUED] chlorhexidine (PERIDEX) 0.12 % solution Rinse with 15 mls twice daily for 30 seconds. Use after breakfast and at bedtime. Spit out excess. Do not swallow. (Patient taking differently: Use as directed 15 mLs in the mouth or throat daily. )  . [DISCONTINUED] prochlorperazine (COMPAZINE) 10 MG tablet Take 10 mg by mouth every 6 (six) hours as needed for nausea or vomiting.  . [DISCONTINUED] traMADol (ULTRAM) 50 MG tablet Take 1-2 tablets (50-100 mg total) by mouth every 6 (six) hours as needed for moderate pain or severe pain.  . [DISCONTINUED] traZODone (DESYREL) 50 MG tablet 1/2-1 tablet at bedtime (Patient taking differently: Take 50 mg by mouth at bedtime as needed for sleep. )   Social History   Tobacco Use  . Smoking status: Never Smoker  . Smokeless tobacco: Never Used  Substance Use Topics  . Alcohol use: Not Currently   Family History  Problem Relation Age of Onset  . Prostate cancer Father 53       in his 70's/other comorbidities/79 deceased  . Alcohol abuse Father   . Liver disease Father   . Heart attack Father 77  . Breast cancer  Mother 41       mets to bone,deceased age 72  . Cervical cancer Sister   . Cervical cancer Sister   . Breast cancer Sister   . CAD Brother        Died of sudden cardiac death/MI     Review of Systems  Constitutional: Positive for fatigue (improving). Negative for chills and fever.  Respiratory: Negative for cough, chest tightness, shortness of breath and wheezing.   Cardiovascular: Negative for chest pain, palpitations and leg swelling.  Genitourinary: Negative for difficulty urinating and dysuria.    CBC:  Lab Results  Component Value Date   WBC 6.3 01/01/2020   HGB 9.1 (L) 01/05/2020   HGB 8.2 (L) 11/24/2019   HCT 27.2 (L) 01/05/2020   MCH 31.1 01/01/2020   MCHC 33.2 01/01/2020   RDW 17.1 (H) 01/01/2020   PLT 218 01/01/2020   PLT  220 11/24/2019   CMP: Lab Results  Component Value Date   NA 138 01/05/2020   NA 137 04/07/2019   K 4.4 01/05/2020   CL 103 01/05/2020   CO2 26 01/05/2020   ANIONGAP 9 01/05/2020   GLUCOSE 137 (H) 01/05/2020   BUN 25 (H) 01/05/2020   BUN 29 (H) 04/07/2019   CREATININE 1.74 (H) 01/05/2020   CREATININE 1.06 11/24/2019   LABGLOB 2.8 04/07/2019   GFRAA 43 (L) 01/05/2020   GFRAA >60 11/24/2019   CALCIUM 8.5 (L) 01/05/2020   PROT 6.6 11/24/2019   PROT 6.9 04/07/2019   AGRATIO 1.5 04/07/2019   BILITOT 0.4 11/24/2019   ALKPHOS 70 11/24/2019   ALT 10 11/24/2019   AST 14 (L) 11/24/2019   LIPID: Lab Results  Component Value Date   CHOL 136 04/07/2019   TRIG 127 04/07/2019   HDL 42 04/07/2019   LDLCALC 71 04/07/2019   LABVLDL 23 04/07/2019    Objective:  BP 120/62 (BP Location: Left Arm, Patient Position: Sitting, Cuff Size: Large)   Pulse 63   Temp 97.6 F (36.4 C) (Oral)   Ht 5\' 11"  (1.803 m)   Wt 195 lb 3.2 oz (88.5 kg)   BMI 27.22 kg/m   Weight: 195 lb 3.2 oz (88.5 kg)   BP Readings from Last 3 Encounters:  01/24/20 120/62  01/06/20 130/79  01/01/20 130/69   Wt Readings from Last 3 Encounters:  01/24/20 195 lb 3.2  oz (88.5 kg)  01/04/20 204 lb 9.4 oz (92.8 kg)  01/01/20 198 lb (89.8 kg)    Physical Exam Constitutional:      General: He is not in acute distress.    Appearance: He is well-developed.  Cardiovascular:     Rate and Rhythm: Normal rate and regular rhythm.     Heart sounds: Normal heart sounds. No murmur heard.  No friction rub.  Pulmonary:     Effort: Pulmonary effort is normal. No respiratory distress.     Breath sounds: Normal breath sounds. No wheezing or rales.  Abdominal:     General: Abdomen is flat. Bowel sounds are normal.  Musculoskeletal:     Right lower leg: No edema.     Left lower leg: No edema.  Neurological:     Mental Status: He is alert and oriented to person, place, and time.  Psychiatric:        Behavior: Behavior normal.     Assessment/Plan: There are no preventive care reminders to display for this patient. Health Maintenance reviewed. 1. Preventative health care He is really working on staying healthy.  He is getting back into regular exercise and eating very healthy to feel his body.  We discussed eating well-rounded diet including "eating the rainbow".  We discussed that if he does this, supplements generally would not be needed for him.  2. Essential hypertension Blood pressures been well controlled.  Encouraged him to continue to check at home.  Continue amlodipine 2.5 mg and metoprolol milligrams daily.  3. Coronary artery disease involving native coronary artery of native heart without angina pectoris Does follow with cardiology regularly.  Blood pressure and cholesterol been well controlled.  Continue Lipitor 80 mg.  4. Mild intermittent asthma without complication Breathing has been well controlled.  Patient is using incentive spirometry regularly postoperatively.  Well exam is great today.  5. Malignant tumor of renal pelvis, left [facility]) He is following regularly with oncology as well as urology.  He states they are planning to repeat  blood  work when he has appointment next week.  6. Hyperlipidemia, unspecified hyperlipidemia type Continue Lipitor 80 mg daily.  He tolerates his medication well.  7. Need for immunization against influenza - Flu Vaccine QUAD High Dose(Fluad)    Return in about 6 months (around 07/24/2020) for physical exam.  [name], MD

## 2020-01-25 ENCOUNTER — Encounter: Payer: Self-pay | Admitting: Oncology

## 2020-01-26 ENCOUNTER — Telehealth: Payer: Self-pay

## 2020-01-26 NOTE — Telephone Encounter (Signed)
Sent patient MyChart message per Dr Alen Blew that patient will have a follow up visit in November and at visit concern with monitoring will be addressed at this appointment. Advised patient if he had any further questions to call the office or send a MyChart message.

## 2020-01-29 ENCOUNTER — Encounter: Payer: Self-pay | Admitting: *Deleted

## 2020-02-05 ENCOUNTER — Telehealth: Payer: Self-pay | Admitting: Cardiovascular Disease

## 2020-02-05 NOTE — Telephone Encounter (Signed)
Patient calling in regards to his mychart message. He states he had dizzy spell incidents last night and would like to know if he needs to be seen.

## 2020-02-06 ENCOUNTER — Encounter: Payer: Self-pay | Admitting: Family Medicine

## 2020-02-06 MED ORDER — METOPROLOL SUCCINATE ER 50 MG PO TB24
50.0000 mg | ORAL_TABLET | Freq: Every day | ORAL | 3 refills | Status: DC
Start: 2020-02-06 — End: 2021-06-16

## 2020-02-06 NOTE — Telephone Encounter (Signed)
Please see patient message note. Reduce metoprolol succinate to 50 mg daily with evening meal.

## 2020-02-06 NOTE — Telephone Encounter (Signed)
Left message to call back  

## 2020-02-06 NOTE — Telephone Encounter (Signed)
Called patient to discuss symptoms and schedule appointment.  Left message to call back.

## 2020-02-06 NOTE — Telephone Encounter (Signed)
Metoprolol Succinate 50 mg once daily has been sent in for the patient.

## 2020-02-06 NOTE — Telephone Encounter (Signed)
Pt is returning call.  

## 2020-02-06 NOTE — Telephone Encounter (Signed)
-    Pt states he had nephrectomy 4 weeks ago and has had no issues, labs were recently normal. -- At 4am, this morning, pt up to empty blader and as he stood at the toilet became light headheaded for less then a minute. -- Pt went from the bathroom to the living room and became lightheaded again. -- Pt states he blacked out and fell backwards across coffee table and the couch.  Pt was out for a few seconds and upon coming to became nauseated and diaphoretic. He states he took med for nausea and went back to bed.  Pt denies CP, SOB and any injury from passing out.  Pt states he is feeling fine today.  BP today is 127/79 with HR of 61.  Pt inquiring about follow up r/t incident.  Will forward information to Dr Sallyanne Kuster and RN for review and recommendations.  Reviewed ED protocol with pt.  Pt verbalizes understanding and agrees with current plan.

## 2020-02-06 NOTE — Telephone Encounter (Signed)
Spoke with pt.  Message has been addressed.  See phone encounter dated 02/06/2020.

## 2020-02-06 NOTE — Telephone Encounter (Signed)
   Pt is returning call, he said he was speaking to [place] and phone got cut off. He said to call his house phone instead

## 2020-03-06 ENCOUNTER — Inpatient Hospital Stay: Payer: PPO | Attending: Oncology | Admitting: Oncology

## 2020-03-06 ENCOUNTER — Inpatient Hospital Stay: Payer: PPO

## 2020-03-06 ENCOUNTER — Other Ambulatory Visit: Payer: Self-pay

## 2020-03-06 VITALS — BP 119/75 | HR 71 | Temp 97.5°F | Resp 18 | Ht 71.0 in | Wt 200.6 lb

## 2020-03-06 DIAGNOSIS — Z95828 Presence of other vascular implants and grafts: Secondary | ICD-10-CM

## 2020-03-06 DIAGNOSIS — T451X5A Adverse effect of antineoplastic and immunosuppressive drugs, initial encounter: Secondary | ICD-10-CM | POA: Insufficient documentation

## 2020-03-06 DIAGNOSIS — D630 Anemia in neoplastic disease: Secondary | ICD-10-CM | POA: Insufficient documentation

## 2020-03-06 DIAGNOSIS — Z9221 Personal history of antineoplastic chemotherapy: Secondary | ICD-10-CM | POA: Insufficient documentation

## 2020-03-06 DIAGNOSIS — C652 Malignant neoplasm of left renal pelvis: Secondary | ICD-10-CM | POA: Insufficient documentation

## 2020-03-06 DIAGNOSIS — Z79899 Other long term (current) drug therapy: Secondary | ICD-10-CM | POA: Insufficient documentation

## 2020-03-06 DIAGNOSIS — Z7982 Long term (current) use of aspirin: Secondary | ICD-10-CM | POA: Insufficient documentation

## 2020-03-06 DIAGNOSIS — C679 Malignant neoplasm of bladder, unspecified: Secondary | ICD-10-CM

## 2020-03-06 DIAGNOSIS — Z452 Encounter for adjustment and management of vascular access device: Secondary | ICD-10-CM | POA: Diagnosis not present

## 2020-03-06 LAB — CMP (CANCER CENTER ONLY)
ALT: 12 U/L (ref 0–44)
AST: 16 U/L (ref 15–41)
Albumin: 3.8 g/dL (ref 3.5–5.0)
Alkaline Phosphatase: 60 U/L (ref 38–126)
Anion gap: 7 (ref 5–15)
BUN: 31 mg/dL — ABNORMAL HIGH (ref 8–23)
CO2: 25 mmol/L (ref 22–32)
Calcium: 8.9 mg/dL (ref 8.9–10.3)
Chloride: 108 mmol/L (ref 98–111)
Creatinine: 1.74 mg/dL — ABNORMAL HIGH (ref 0.61–1.24)
GFR, Estimated: 40 mL/min — ABNORMAL LOW (ref >60)
Glucose, Bld: 117 mg/dL — ABNORMAL HIGH (ref 70–99)
Potassium: 4.5 mmol/L (ref 3.5–5.1)
Sodium: 140 mmol/L (ref 135–145)
Total Bilirubin: 0.8 mg/dL (ref 0.3–1.2)
Total Protein: 6.9 g/dL (ref 6.5–8.1)

## 2020-03-06 LAB — CBC WITH DIFFERENTIAL (CANCER CENTER ONLY)
Abs Immature Granulocytes: 0.03 10*3/uL (ref 0.00–0.07)
Basophils Absolute: 0 10*3/uL (ref 0.0–0.1)
Basophils Relative: 1 %
Eosinophils Absolute: 0.2 10*3/uL (ref 0.0–0.5)
Eosinophils Relative: 4 %
HCT: 34.6 % — ABNORMAL LOW (ref 39.0–52.0)
Hemoglobin: 11.7 g/dL — ABNORMAL LOW (ref 13.0–17.0)
Immature Granulocytes: 1 %
Lymphocytes Relative: 24 %
Lymphs Abs: 1.3 10*3/uL (ref 0.7–4.0)
MCH: 29.8 pg (ref 26.0–34.0)
MCHC: 33.8 g/dL (ref 30.0–36.0)
MCV: 88 fL (ref 80.0–100.0)
Monocytes Absolute: 0.5 10*3/uL (ref 0.1–1.0)
Monocytes Relative: 10 %
Neutro Abs: 3.2 10*3/uL (ref 1.7–7.7)
Neutrophils Relative %: 60 %
Platelet Count: 181 10*3/uL (ref 150–400)
RBC: 3.93 MIL/uL — ABNORMAL LOW (ref 4.22–5.81)
RDW: 12.7 % (ref 11.5–15.5)
WBC Count: 5.2 10*3/uL (ref 4.0–10.5)
nRBC: 0 % (ref 0.0–0.2)

## 2020-03-06 MED ORDER — SODIUM CHLORIDE 0.9% FLUSH
10.0000 mL | Freq: Once | INTRAVENOUS | Status: AC
  Administered 2020-03-06: 10 mL
  Filled 2020-03-06: qty 10

## 2020-03-06 MED ORDER — HEPARIN SOD (PORK) LOCK FLUSH 100 UNIT/ML IV SOLN
500.0000 [IU] | Freq: Once | INTRAVENOUS | Status: AC
  Administered 2020-03-06: 500 [IU]
  Filled 2020-03-06: qty 5

## 2020-03-06 NOTE — Progress Notes (Signed)
Hematology and Oncology Follow Up Visit  Lawrence Hall [id number] 1946/02/15 74 y.o. 03/06/2020 12:36 PM [name], MDKoberlein, [name], MD   Principle Diagnosis: 74 year old with left renal pelvis urothelial carcinoma diagnosed in May 2021.  He was found to have T4N1 disease after nephro ureterectomy in September 2021.  Prior Therapy: He is status post biopsy obtained on Aug 21, 2019 which showed high-grade urothelial carcinoma of the left renal pelvis.   He status post neoadjuvant chemotherapy utilizing gemcitabine and cisplatin given on day 1 and cisplatin given on day 8 start her on 10/06/2019.   He completed 3 cycles of therapy in August 2021.  He is status post left robotic assisted laparoscopic nephroureterectomy and retroperitoneal lymphadenectomy completed on 01/04/2020.  He was found to have a residual T4 disease with 1 out of 5 lymph nodes involved.  Current therapy: Active surveillance post surgery.   Interim History: Lawrence Hall presents today for a repeat evaluation.  Since the last visit, he underwent surgical resection with a left nephro ureterectomy and lymph node dissection in September 2021 with excellent tolerance.  He denies any residual complications at this time.  He denies any nausea, vomiting or abdominal pain.  He denies any frequency or dysuria.     Medications: Updated on review. Current Outpatient Medications  Medication Sig Dispense Refill  . amLODipine (NORVASC) 2.5 MG tablet Take 1 tablet (2.5 mg total) by mouth daily. (Patient taking differently: Take 2.5 mg by mouth at bedtime. ) 30 tablet 6  . aspirin EC 81 MG tablet Take 1 tablet (81 mg total) by mouth daily. Swallow whole. 30 tablet 11  . atorvastatin (LIPITOR) 80 MG tablet TAKE 1 TABLET ONCE DAILY AT 6 PM. (Patient taking differently: Take 80 mg by mouth daily at 6 PM. ) 90 tablet 2  . metoprolol succinate (TOPROL-XL) 50 MG 24 hr tablet Take 1 tablet (50 mg total) by mouth at bedtime. 90 tablet  3  . nitroGLYCERIN (NITROSTAT) 0.4 MG SL tablet 1 TAB UNDER TONGUE AS NEEDED FOR CHEST PAIN. MAY REPEAT EVERY 5 MIN FOR A TOTAL OF 3 DOSES. (Patient taking differently: Place 0.4 mg under the tongue every 5 (five) minutes as needed for chest pain. ) 25 tablet 4   No current facility-administered medications for this visit.     Allergies:  Allergies  Allergen Reactions  . Other Itching and Other (See Comments)    Pet dander, seasonal allergies = Runny nose, itchy eyes      Physical Exam:  Blood pressure 119/75, pulse 71, temperature (!) 97.5 F (36.4 C), temperature source Tympanic, resp. rate 18, height 5\' 11"  (1.803 m), weight 200 lb 9.6 oz (91 kg), SpO2 98 %.    ECOG: 1    General appearance: Alert, awake without any distress. Head: Atraumatic without abnormalities Oropharynx: Without any thrush or ulcers. Eyes: No scleral icterus. Lymph nodes: No lymphadenopathy noted in the cervical, supraclavicular, or axillary nodes Heart:regular rate and rhythm, without any murmurs or gallops.   Lung: Clear to auscultation without any rhonchi, wheezes or dullness to percussion. Abdomin: Soft, nontender without any shifting dullness or ascites. Musculoskeletal: No clubbing or cyanosis. Neurological: No motor or sensory deficits. Skin: No rashes or lesions.          Lab Results: Lab Results  Component Value Date   WBC 5.2 03/06/2020   HGB 11.7 (L) 03/06/2020   HCT 34.6 (L) 03/06/2020   MCV 88.0 03/06/2020   PLT 181 03/06/2020  Chemistry      Component Value Date/Time   NA 140 03/06/2020 1205   NA 137 04/07/2019 1021   K 4.5 03/06/2020 1205   CL 108 03/06/2020 1205   CO2 25 03/06/2020 1205   BUN 31 (H) 03/06/2020 1205   BUN 29 (H) 04/07/2019 1021   CREATININE 1.74 (H) 03/06/2020 1205      Component Value Date/Time   CALCIUM 8.9 03/06/2020 1205   ALKPHOS 60 03/06/2020 1205   AST 16 03/06/2020 1205   ALT 12 03/06/2020 1205   BILITOT 0.8 03/06/2020 1205        Impression and Plan:  74 year old man with:   1.    High-grade urothelial carcinoma of the left renal pelvis diagnosed in May 2021.  He was found to have a T4N1 residual disease after neoadjuvant chemotherapy and surgical resection.    The natural course of this disease was reviewed and additional treatment options were discussed at this time.  Continuing with platinum based chemotherapy adjuvantly versus immunotherapy versus active surveillance were reviewed.  I recommended repeat imaging studies in the near future before determining the best course of action.  If he has still residual disease or progressive metastasis, I would recommend treating with immunotherapy.  He is disease-free continued active surveillance may be reasonable at this time.  After discussion he is agreeable with this plan and will arrange for imaging studies in January 2022.   2. IV access: Port-A-Cath will continue to be in use and flushed periodically.   3. Antiemetics: No residual nausea or vomiting reported at this time.  4. Renal function surveillance:We will continue to monitor periodically with creatinine clearance currently around 40 cc/min.  5. Goals of care:  Therapy remains curative at this time with aggressive measures are warranted.  6.  Anemia: Related to his malignancy and chemotherapy.  His hemoglobin is improving.  7. Follow-up: In 6 weeks for repeat imaging studies.   30  minutes were dedicated to this encounter.  Time was spent on reviewing his disease status, discussing pathology results outlining future plan of care.     [name], MD 11/17/202112:36 PM

## 2020-03-06 NOTE — Patient Instructions (Signed)

## 2020-04-15 ENCOUNTER — Ambulatory Visit: Payer: PPO | Admitting: Cardiovascular Disease

## 2020-04-15 ENCOUNTER — Other Ambulatory Visit: Payer: Self-pay

## 2020-04-15 ENCOUNTER — Encounter: Payer: Self-pay | Admitting: Cardiovascular Disease

## 2020-04-15 VITALS — BP 137/72 | HR 75 | Ht 72.0 in | Wt 208.4 lb

## 2020-04-15 DIAGNOSIS — I459 Conduction disorder, unspecified: Secondary | ICD-10-CM | POA: Diagnosis not present

## 2020-04-15 DIAGNOSIS — E7800 Pure hypercholesterolemia, unspecified: Secondary | ICD-10-CM | POA: Diagnosis not present

## 2020-04-15 DIAGNOSIS — I2510 Atherosclerotic heart disease of native coronary artery without angina pectoris: Secondary | ICD-10-CM | POA: Diagnosis not present

## 2020-04-15 DIAGNOSIS — N1832 Chronic kidney disease, stage 3b: Secondary | ICD-10-CM

## 2020-04-15 DIAGNOSIS — Z953 Presence of xenogenic heart valve: Secondary | ICD-10-CM | POA: Diagnosis not present

## 2020-04-15 DIAGNOSIS — I4891 Unspecified atrial fibrillation: Secondary | ICD-10-CM | POA: Diagnosis not present

## 2020-04-15 DIAGNOSIS — C652 Malignant neoplasm of left renal pelvis: Secondary | ICD-10-CM

## 2020-04-15 DIAGNOSIS — I9789 Other postprocedural complications and disorders of the circulatory system, not elsewhere classified: Secondary | ICD-10-CM | POA: Diagnosis not present

## 2020-04-15 DIAGNOSIS — I10 Essential (primary) hypertension: Secondary | ICD-10-CM | POA: Diagnosis not present

## 2020-04-15 NOTE — Progress Notes (Signed)
Patient ID: Lawrence Hall, male   DOB: [birth year], 74 y.o.   MRN: [id number] Patient ID: Lawrence Hall, male   DOB: [birth year], 74 y.o.   MRN: [id number]    Cardiology Office Note    Date:  04/16/2020   ID:  Lawrence Hall, DOB [birth year], MRN [id number]  PCP:  Wynn BankerKoberlein, Junell C, MD  Cardiologist:   Thurmon FairMihai Tramane Gorum, MD   Chief Complaint  Patient presents with  . Coronary Artery Disease    History of Present Illness:  Lawrence Hall is a 74 y.o. male with hyperlipidemia, hypertension, coronary artery disease, history of non-ST segment elevation myocardial infarction in 2017 due to a high-grade stenosis in the (proximal left circumflex coronary drug-eluting stent, 3.534 Resolute), returning with exertional angina/dyspnea and hypotensive response to stress testing in January 2020, found to have left main and proximal right coronary stenoses.  He therefore underwent CABG x3 (LIMA to LAD, SVG to OM, SVG to RCA) and aortic valve replacement with a 25 mm Edwards pericardial valve by Dr. Donata Clayvan Trigt on April 29, 2018.  Postoperative course was complicated by atrial fibrillation and development of a left bundle branch block, superficial vein irritation probably from amiodarone.  He was diagnosed with urothelial carcinoma of the left renal pelvis and underwent robotic nephrectomy in September, after undergoing several cycles of chemotherapy.  Ultimately found to have T4N1 disease and is being followed by Dr. Clelia CroftShadad with plans for another CT scan in the first week of January.  His creatinine has stabilized at approximately 1.7 following the nephrectomy.  In mid October he had a brief syncopal spell with a pattern strongly suggestive of orthostatic hypotension.  He had lost some weight and we reduced the dose of his beta-blocker at that time.  He has not had any further episodes of syncope or near syncope. The patient specifically denies any chest pain at rest exertion, dyspnea at rest or with exertion, orthopnea,  paroxysmal nocturnal dyspnea, palpitations, focal neurological deficits, intermittent claudication, lower extremity edema, unexplained weight gain, cough, hemoptysis or wheezing.  Echocardiogram performed February 21, 2019 shows excellent aortic valve prosthesis parameters (peak gradient 16, mean gradient 10, dimensionless index 0.6) and normal left ventricular systolic function.  Past Medical History:  Diagnosis Date  . Arthritis    mild hands  . Asthma    pt denies 4/21   . Chest pain 07/25/2012  . Coronary artery disease   . Dysrhythmia    PAC'S, hx of atrial fib after OHS 04/2018  . ED (erectile dysfunction)   . H/O hiatal hernia   . Heart murmur   . Hypercholesterolemia   . Hypertension    MARGINALLY HIGH - NO MEDS -MONITORS  . Myocardial infarction (HCC)    2017   . Pneumonia    hx of 2018   . PONV (postoperative nausea and vomiting)   . Prostate cancer (HCC) 03/27/2011   prostate bx=adenocarcinoma,  . Skin cancer 2008   basal cell face /removed  . Sleep apnea    borderline sleep apnea per patient no devices   . Urothelial cancer [facility](HCC)     Past Surgical History:  Procedure Laterality Date  . AORTIC VALVE REPLACEMENT N/A 04/29/2018   Procedure: AORTIC VALVE REPLACEMENT (AVR) using INSPIRIS RESILIA  AORTIC VALVE 25mm;  Surgeon: Kerin PernaVan Trigt, Peter, MD;  [facility];  Service: Open Heart Surgery;  Laterality: N/A;  . basal cell cancer  2008   inside nose  . CARDIAC CATHETERIZATION N/A 06/03/2015  Procedure: Left Heart Cath and Coronary Angiography;  Surgeon: [name] Sine, MD;  Location: [place] CV LAB;  Service: Cardiovascular;  Laterality: N/A;  . CATARACT EXTRACTION, BILATERAL  2007  . CORONARY ARTERY BYPASS GRAFT N/A 04/29/2018   Procedure: CORONARY ARTERY BYPASS GRAFTING (CABG) X 3; Using Left Internal Mammary Artery (LIMA) and Right Leg Greater Saphenous Vein Graft (SVG);  LIMA to LAD, SVG to OM1, SVG to RCA,;  Surgeon: Ivin Poot, MD;  Location: [facility];   Service: Open Heart Surgery;  Laterality: N/A;  . CYSTOSCOPY/RETROGRADE/URETEROSCOPY Left 08/21/2019   Procedure: CYSTOSCOPY/RETROGRADE/URETEROSCOPY/  LEFT BIOPSY/BRUSH BIOPSY/RENAL WASHINGS/ AND STENT PLACEMENT;  Surgeon: Raynelle Bring, MD;  Location: [facility] ORS;  Service: Urology;  Laterality: Left;  . IR IMAGING GUIDED PORT INSERTION  09/28/2019  . RIGHT/LEFT HEART CATH AND CORONARY ANGIOGRAPHY N/A 04/26/2018   Procedure: RIGHT/LEFT HEART CATH AND CORONARY ANGIOGRAPHY;  Surgeon: [name] Sine, MD;  Location: [place] [facility] LAB;  Service: Cardiovascular;  Laterality: N/A;  . ROBOT ASSISTED LAPAROSCOPIC RADICAL PROSTATECTOMY  08/10/2011   Procedure: ROBOTIC ASSISTED LAPAROSCOPIC RADICAL PROSTATECTOMY LEVEL 2;  Surgeon: Dutch Gray, MD;  Location: [facility];  Service: Urology;  Laterality: N/A;  . ROBOT ASSITED LAPAROSCOPIC NEPHROURETERECTOMY Left 01/04/2020   Procedure: XI ROBOT ASSITED LAPAROSCOPIC NEPHROURETERECTOMY;  Surgeon: Raynelle Bring, MD;  Location: [facility];  Service: Urology;  Laterality: Left;  NEEDS 240 MIN FOR PROCEDURE  . TEE WITHOUT CARDIOVERSION N/A 04/29/2018   Procedure: TRANSESOPHAGEAL ECHOCARDIOGRAM (TEE);  Surgeon: Prescott Gum, Collier Salina, MD;  Location: [facility];  Service: Open Heart Surgery;  Laterality: N/A;  . THYROID SURGERY  infant   uncertain details; no thyroid replacement listed  . WISDOM TOOTH EXTRACTION      Current Medications: Outpatient Medications Prior to Visit  Medication Sig Dispense Refill  . amLODipine (NORVASC) 2.5 MG tablet Take 1 tablet (2.5 mg total) by mouth daily. (Patient taking differently: Take 2.5 mg by mouth at bedtime.) 30 tablet 6  . aspirin EC 81 MG tablet Take 1 tablet (81 mg total) by mouth daily. Swallow whole. 30 tablet 11  . atorvastatin (LIPITOR) 80 MG tablet TAKE 1 TABLET ONCE DAILY AT 6 PM. (Patient taking differently: Take 80 mg by mouth daily at 6 PM.) 90 tablet 2  . metoprolol succinate (TOPROL-XL) 50 MG 24 hr tablet Take 1 tablet (50 mg total) by  mouth at bedtime. 90 tablet 3  . Multiple Vitamins-Minerals (IMMUNE SUPPORT VITAMIN C PO) Take by mouth.    . nitroGLYCERIN (NITROSTAT) 0.4 MG SL tablet 1 TAB UNDER TONGUE AS NEEDED FOR CHEST PAIN. MAY REPEAT EVERY 5 MIN FOR A TOTAL OF 3 DOSES. (Patient taking differently: Place 0.4 mg under the tongue every 5 (five) minutes as needed for chest pain.) 25 tablet 4   No facility-administered medications prior to visit.     Allergies:   Other   Social History   Socioeconomic History  . Marital status: Married    Spouse name: [name]   . Number of children: 0  . Years of education: 15 years   . Highest education level: Not on file  Occupational History    Employer: [employer]    Comment: customer service [employer]  Tobacco Use  . Smoking status: Never Smoker  . Smokeless tobacco: Never Used  Vaping Use  . Vaping Use: Never used  Substance and Sexual Activity  . Alcohol use: Not Currently  . Drug use: No  . Sexual activity: Not on file  Other  Topics Concern  . Not on file  Social History Narrative  . Not on file   Social Determinants of Health   Financial Resource Strain: Not on file  Food Insecurity: Not on file  Transportation Needs: Not on file  Physical Activity: Not on file  Stress: Not on file  Social Connections: Not on file     Family History:  The patient's family history includes Alcohol abuse in his father; Breast cancer in his sister; Breast cancer (age of onset: 46) in his mother; CAD in his brother; Cervical cancer in his sister and sister; Heart attack (age of onset: 72) in his father; Liver disease in his father; Prostate cancer (age of onset: 23) in his father.   ROS:   Please see the history of present illness.    All other systems are reviewed and are negative.   PHYSICAL EXAM:   VS:  BP 137/72   Pulse 75   Ht 6' (1.829 m)   Wt 208 lb 6.4 oz (94.5 kg)   SpO2 98%   BMI 28.26 kg/m     General: Alert, oriented x3, no distress, moderately  overweight Head: no evidence of trauma, PERRL, EOMI, no exophtalmos or lid lag, no myxedema, no xanthelasma; normal ears, nose and oropharynx Neck: normal jugular venous pulsations and no hepatojugular reflux; brisk carotid pulses without delay and no carotid bruits Chest: clear to auscultation, no signs of consolidation by percussion or palpation, normal fremitus, symmetrical and full respiratory excursions Cardiovascular: normal position and quality of the apical impulse, regular rhythm, normal first and second heart sounds, no murmurs, rubs or gallops Abdomen: no tenderness or distention, no masses by palpation, no abnormal pulsatility or arterial bruits, normal bowel sounds, no hepatosplenomegaly Extremities: no clubbing, cyanosis or edema; 2+ radial, ulnar and brachial pulses bilaterally; 2+ right femoral, posterior tibial and dorsalis pedis pulses; 2+ left femoral, posterior tibial and dorsalis pedis pulses; no subclavian or femoral bruits Neurological: grossly nonfocal Psych: Normal mood and affect   Wt Readings from Last 3 Encounters:  04/15/20 208 lb 6.4 oz (94.5 kg)  03/06/20 200 lb 9.6 oz (91 kg)  01/24/20 195 lb 3.2 oz (88.5 kg)      Studies/Labs Reviewed:   ECHO 02/21/2019:   1. Left ventricular ejection fraction, by visual estimation, is 55 to 60%. The left ventricle has normal function. There is mildly increased left ventricular hypertrophy.  2. Left ventricular diastolic parameters are consistent with Grade I diastolic dysfunction (impaired relaxation).  3. Global right ventricle has normal systolic function.The right ventricular size is normal. No increase in right ventricular wall thickness.  4. Left atrial size was normal.  5. Right atrial size was normal.  6. The mitral valve is normal in structure. Trace mitral valve regurgitation. No evidence of mitral stenosis.  7. The tricuspid valve is normal in structure. Tricuspid valve regurgitation is mild.  8. The aortic valve  is normal in structure. Aortic valve regurgitation is not visualized. No evidence of aortic valve sclerosis or stenosis.  9. The pulmonic valve was normal in structure. Pulmonic valve regurgitation is not visualized. 10. Mildly elevated pulmonary artery systolic pressure. 11. The inferior vena cava is normal in size with greater than 50% respiratory variability, suggesting right atrial pressure of 3 mmHg.  EKG:  EKG is not ordered today.  Personally reviewed his EKG from August 2021 which shows sinus rhythm with first-degree AV block and a major atypical intraventricular conduction defect (QRS 132 ms), no clear evidence of  ischemic repolarization abnormalities, borderline QTC prolongation.  BMET    Component Value Date/Time   NA 140 03/06/2020 1205   NA 137 04/07/2019 1021   K 4.5 03/06/2020 1205   CL 108 03/06/2020 1205   CO2 25 03/06/2020 1205   GLUCOSE 117 (H) 03/06/2020 1205   BUN 31 (H) 03/06/2020 1205   BUN 29 (H) 04/07/2019 1021   CREATININE 1.74 (H) 03/06/2020 1205   CALCIUM 8.9 03/06/2020 1205   GFRNONAA 40 (L) 03/06/2020 1205   GFRAA 43 (L) 01/05/2020 0455   GFRAA >60 11/24/2019 1003   Lipid Panel     Component Value Date/Time   CHOL 136 04/07/2019 1021   TRIG 127 04/07/2019 1021   HDL 42 04/07/2019 1021   CHOLHDL 3.2 04/07/2019 1021   CHOLHDL 5.0 06/01/2015 1202   VLDL 52 (H) 06/01/2015 1202   LDLCALC 71 04/07/2019 1021   12/19/2019 Total cholesterol 142, HDL 51, LDL 60, triglycerides 157  ASSESSMENT:    1. Coronary artery disease involving native coronary artery of native heart without angina pectoris   2. History of aortic valve replacement with bioprosthetic valve   3. Postoperative atrial fibrillation (HCC)   4. Abnormal cardiac conduction   5. Essential hypertension   6. Hypercholesterolemia   7. Malignant tumor of renal pelvis, left ([place])   8. Stage 3b chronic kidney disease ([place])      PLAN:  In order of problems listed above:  1. [name]:  Remains free of angina ever since bypass surgery. 2. S/P AVR: Normal function of the biological prosthesis by clinical exam.  Normal findings on echo roughly 1 year ago. 3. AFib: Postoperative arrhythmia only, no recurrence to date. 4. IVCD/1st deg AVB: He has no signs or symptoms of high-grade AV block.  The syncopal event that occurred in October was consistent with hypotension.  Avoid higher doses of beta-blocker or other negative chronotropic agents. 5. HTN: Adequate control.  In October, when he had his brief syncope, he had lost roughly 20 pounds compared to a year ago and he has now gained all that back. 6. HLP: LDL in target range at 60.  Borderline elevated triglycerides do not require specific therapy.  Avoid additional weight gain. 7. Left kidney urothelial carcinoma status post nephrectomy: Making good progress postop. 8. CKD 3b: Following nephrectomy, GFR 40.  Medication Adjustments/Labs and Tests Ordered: Current medicines are reviewed at length with the patient today.  Concerns regarding medicines are outlined above.  Medication changes, Labs and Tests ordered today are listed in the Patient Instructions below. Patient Instructions  Medication Instructions:  No changes *If you need a refill on your cardiac medications before your next appointment, please call your pharmacy*   Lab Work: None ordered If you have labs (blood work) drawn today and your tests are completely normal, you will receive your results only by: [name] MyChart Message (if you have MyChart) OR . A paper copy in the mail If you have any lab test that is abnormal or we need to change your treatment, we will call you to review the results.   Testing/Procedures: None ordered   Follow-Up: At [facility], you and your health needs are our priority.  As part of our continuing mission to provide you with exceptional heart care, we have created designated Provider Care Teams.  These Care Teams include your primary  Cardiologist (physician) and Advanced Practice Providers (APPs -  Physician Assistants and Nurse Practitioners) who all work together to provide you  with the care you need, when you need it.  We recommend signing up for the patient portal called "MyChart".  Sign up information is provided on this After Visit Summary.  MyChart is used to connect with patients for Virtual Visits (Telemedicine).  Patients are able to view lab/test results, encounter notes, upcoming appointments, etc.  Non-urgent messages can be sent to your provider as well.   To learn more about what you can do with MyChart, go to [email or web address].    Your next appointment:   12 month(s)  The format for your next appointment:   In Person  Provider:   You may see Sanda Klein, MD or one of the following Advanced Practice Providers on your designated Care Team:    Almyra Deforest, PA-C  Fabian Sharp, [place] or   [name], PA-C      Signed, Sanda Klein, MD  04/16/2020 9:26 AM    [facility] [place], [place], [place]  [id number] Phone: [phone]; Fax: [phone]

## 2020-04-15 NOTE — Patient Instructions (Signed)

## 2020-04-16 ENCOUNTER — Encounter: Payer: Self-pay | Admitting: Cardiovascular Disease

## 2020-04-23 ENCOUNTER — Inpatient Hospital Stay: Payer: PPO

## 2020-04-23 ENCOUNTER — Encounter (HOSPITAL_COMMUNITY): Payer: Self-pay

## 2020-04-23 ENCOUNTER — Other Ambulatory Visit: Payer: PPO

## 2020-04-23 ENCOUNTER — Inpatient Hospital Stay: Payer: PPO | Attending: Oncology

## 2020-04-23 ENCOUNTER — Ambulatory Visit (HOSPITAL_COMMUNITY)
Admission: RE | Admit: 2020-04-23 | Discharge: 2020-04-23 | Disposition: A | Discharge status: Home / Self Care | Payer: PPO | Source: Ambulatory Visit | Attending: Oncology | Admitting: Oncology

## 2020-04-23 ENCOUNTER — Other Ambulatory Visit: Payer: Self-pay

## 2020-04-23 DIAGNOSIS — N281 Cyst of kidney, acquired: Secondary | ICD-10-CM | POA: Diagnosis not present

## 2020-04-23 DIAGNOSIS — C652 Malignant neoplasm of left renal pelvis: Secondary | ICD-10-CM | POA: Diagnosis not present

## 2020-04-23 DIAGNOSIS — Z79899 Other long term (current) drug therapy: Secondary | ICD-10-CM | POA: Diagnosis not present

## 2020-04-23 DIAGNOSIS — Z9221 Personal history of antineoplastic chemotherapy: Secondary | ICD-10-CM | POA: Insufficient documentation

## 2020-04-23 DIAGNOSIS — D649 Anemia, unspecified: Secondary | ICD-10-CM | POA: Diagnosis not present

## 2020-04-23 DIAGNOSIS — Z7982 Long term (current) use of aspirin: Secondary | ICD-10-CM | POA: Insufficient documentation

## 2020-04-23 DIAGNOSIS — I2510 Atherosclerotic heart disease of native coronary artery without angina pectoris: Secondary | ICD-10-CM | POA: Diagnosis not present

## 2020-04-23 DIAGNOSIS — C679 Malignant neoplasm of bladder, unspecified: Secondary | ICD-10-CM

## 2020-04-23 DIAGNOSIS — J984 Other disorders of lung: Secondary | ICD-10-CM | POA: Diagnosis not present

## 2020-04-23 DIAGNOSIS — Z8546 Personal history of malignant neoplasm of prostate: Secondary | ICD-10-CM | POA: Diagnosis not present

## 2020-04-23 DIAGNOSIS — M47814 Spondylosis without myelopathy or radiculopathy, thoracic region: Secondary | ICD-10-CM | POA: Diagnosis not present

## 2020-04-23 DIAGNOSIS — C642 Malignant neoplasm of left kidney, except renal pelvis: Secondary | ICD-10-CM | POA: Diagnosis not present

## 2020-04-23 DIAGNOSIS — Z95828 Presence of other vascular implants and grafts: Secondary | ICD-10-CM

## 2020-04-23 LAB — CMP (CANCER CENTER ONLY)
ALT: 15 U/L (ref 0–44)
AST: 17 U/L (ref 15–41)
Albumin: 3.9 g/dL (ref 3.5–5.0)
Alkaline Phosphatase: 65 U/L (ref 38–126)
Anion gap: 9 (ref 5–15)
BUN: 31 mg/dL — ABNORMAL HIGH (ref 8–23)
CO2: 23 mmol/L (ref 22–32)
Calcium: 9 mg/dL (ref 8.9–10.3)
Chloride: 107 mmol/L (ref 98–111)
Creatinine: 1.79 mg/dL — ABNORMAL HIGH (ref 0.61–1.24)
GFR, Estimated: 39 mL/min — ABNORMAL LOW (ref >60)
Glucose, Bld: 87 mg/dL (ref 70–99)
Potassium: 4.4 mmol/L (ref 3.5–5.1)
Sodium: 139 mmol/L (ref 135–145)
Total Bilirubin: 1.1 mg/dL (ref 0.3–1.2)
Total Protein: 7.3 g/dL (ref 6.5–8.1)

## 2020-04-23 LAB — CBC WITH DIFFERENTIAL (CANCER CENTER ONLY)
Abs Immature Granulocytes: 0.02 10*3/uL (ref 0.00–0.07)
Basophils Absolute: 0 10*3/uL (ref 0.0–0.1)
Basophils Relative: 1 %
Eosinophils Absolute: 0.2 10*3/uL (ref 0.0–0.5)
Eosinophils Relative: 4 %
HCT: 35.8 % — ABNORMAL LOW (ref 39.0–52.0)
Hemoglobin: 12.1 g/dL — ABNORMAL LOW (ref 13.0–17.0)
Immature Granulocytes: 0 %
Lymphocytes Relative: 22 %
Lymphs Abs: 1.3 10*3/uL (ref 0.7–4.0)
MCH: 29.2 pg (ref 26.0–34.0)
MCHC: 33.8 g/dL (ref 30.0–36.0)
MCV: 86.5 fL (ref 80.0–100.0)
Monocytes Absolute: 0.5 10*3/uL (ref 0.1–1.0)
Monocytes Relative: 9 %
Neutro Abs: 3.7 10*3/uL (ref 1.7–7.7)
Neutrophils Relative %: 64 %
Platelet Count: 189 10*3/uL (ref 150–400)
RBC: 4.14 MIL/uL — ABNORMAL LOW (ref 4.22–5.81)
RDW: 12.9 % (ref 11.5–15.5)
WBC Count: 5.7 10*3/uL (ref 4.0–10.5)
nRBC: 0 % (ref 0.0–0.2)

## 2020-04-23 MED ORDER — HEPARIN SOD (PORK) LOCK FLUSH 100 UNIT/ML IV SOLN
INTRAVENOUS | Status: AC
  Filled 2020-04-23: qty 5

## 2020-04-23 MED ORDER — HEPARIN SOD (PORK) LOCK FLUSH 100 UNIT/ML IV SOLN
500.0000 [IU] | Freq: Once | INTRAVENOUS | Status: AC
  Administered 2020-04-23: 500 [IU] via INTRAVENOUS

## 2020-04-23 MED ORDER — SODIUM CHLORIDE 0.9% FLUSH
10.0000 mL | Freq: Once | INTRAVENOUS | Status: AC
  Administered 2020-04-23: 10 mL
  Filled 2020-04-23: qty 10

## 2020-04-23 NOTE — Progress Notes (Signed)
Port left accessed for CT. Patient advised to make sure port needle is removed before leaving CT

## 2020-04-25 ENCOUNTER — Telehealth: Payer: Self-pay | Admitting: Oncology

## 2020-04-25 NOTE — Telephone Encounter (Signed)
Called patient regarding upcoming scheduled appointment, patient is notified. °

## 2020-04-26 ENCOUNTER — Inpatient Hospital Stay: Payer: PPO | Admitting: Oncology

## 2020-04-26 ENCOUNTER — Other Ambulatory Visit: Payer: Self-pay

## 2020-04-26 VITALS — BP 146/67 | HR 67 | Temp 97.7°F | Resp 17 | Ht 72.0 in | Wt 208.7 lb

## 2020-04-26 DIAGNOSIS — C652 Malignant neoplasm of left renal pelvis: Secondary | ICD-10-CM

## 2020-04-26 NOTE — Progress Notes (Signed)
Hematology and Oncology Follow Up Visit  Lawrence Hall [id number] November 18, 1945 75 y.o. 04/26/2020 12:46 PM [name], MDKoberlein, [name], MD   Principle Diagnosis: 75 year old with T4N1 left renal pelvis urothelial carcinoma diagnosed in May 2021.    Prior Therapy: He is status post biopsy obtained on Aug 21, 2019 which showed high-grade urothelial carcinoma of the left renal pelvis.   He status post neoadjuvant chemotherapy utilizing gemcitabine and cisplatin given on day 1 and cisplatin given on day 8 start her on 10/06/2019.   He completed 3 cycles of therapy in August 2021.  He is status post left robotic assisted laparoscopic nephroureterectomy and retroperitoneal lymphadenectomy completed on 01/04/2020.  He was found to have a residual T4 disease with 1 out of 5 lymph nodes involved.  Current therapy: Active surveillance   Interim History: Lawrence Hall is here for return follow-up. Since last visit, he reports no major changes in his health.  He has reported no recent hospitalization or illnesses.  His performance status and quality of life remained excellent.  He denied any flank pain or hematuria.     Medications: Unchanged on review. Current Outpatient Medications  Medication Sig Dispense Refill  . amLODipine (NORVASC) 2.5 MG tablet Take 1 tablet (2.5 mg total) by mouth daily. (Patient taking differently: Take 2.5 mg by mouth at bedtime.) 30 tablet 6  . aspirin EC 81 MG tablet Take 1 tablet (81 mg total) by mouth daily. Swallow whole. 30 tablet 11  . atorvastatin (LIPITOR) 80 MG tablet TAKE 1 TABLET ONCE DAILY AT 6 PM. (Patient taking differently: Take 80 mg by mouth daily at 6 PM.) 90 tablet 2  . metoprolol succinate (TOPROL-XL) 50 MG 24 hr tablet Take 1 tablet (50 mg total) by mouth at bedtime. 90 tablet 3  . Multiple Vitamins-Minerals (IMMUNE SUPPORT VITAMIN C PO) Take by mouth.    . nitroGLYCERIN (NITROSTAT) 0.4 MG SL tablet 1 TAB UNDER TONGUE AS NEEDED FOR CHEST PAIN.  MAY REPEAT EVERY 5 MIN FOR A TOTAL OF 3 DOSES. (Patient taking differently: Place 0.4 mg under the tongue every 5 (five) minutes as needed for chest pain.) 25 tablet 4   No current facility-administered medications for this visit.     Allergies:  Allergies  Allergen Reactions  . Other Itching and Other (See Comments)    Pet dander, seasonal allergies = Runny nose, itchy eyes      Physical Exam:   Blood pressure (!) 146/67, pulse 67, temperature 97.7 F (36.5 C), temperature source Tympanic, resp. rate 17, height 6' (1.829 m), weight 208 lb 11.2 oz (94.7 kg), SpO2 100 %.   ECOG: 1    General appearance: Comfortable appearing without any discomfort Head: Normocephalic without any trauma Oropharynx: Mucous membranes are moist and pink without any thrush or ulcers. Eyes: Pupils are equal and round reactive to light. Lymph nodes: No cervical, supraclavicular, inguinal or axillary lymphadenopathy.   Heart:regular rate and rhythm.  S1 and S2 without leg edema. Lung: Clear without any rhonchi or wheezes.  No dullness to percussion. Abdomin: Soft, nontender, nondistended with good bowel sounds.  No hepatosplenomegaly. Musculoskeletal: No joint deformity or effusion.  Full range of motion noted. Neurological: No deficits noted on motor, sensory and deep tendon reflex exam. Skin: No petechial rash or dryness.  Appeared moist.           Lab Results: Lab Results  Component Value Date   WBC 5.7 04/23/2020   HGB 12.1 (L) 04/23/2020  HCT 35.8 (L) 04/23/2020   MCV 86.5 04/23/2020   PLT 189 04/23/2020     Chemistry      Component Value Date/Time   NA 139 04/23/2020 1229   NA 137 04/07/2019 1021   K 4.4 04/23/2020 1229   CL 107 04/23/2020 1229   CO2 23 04/23/2020 1229   BUN 31 (H) 04/23/2020 1229   BUN 29 (H) 04/07/2019 1021   CREATININE 1.79 (H) 04/23/2020 1229      Component Value Date/Time   CALCIUM 9.0 04/23/2020 1229   ALKPHOS 65 04/23/2020 1229   AST 17  04/23/2020 1229   ALT 15 04/23/2020 1229   BILITOT 1.1 04/23/2020 1229     IMPRESSION: Status post left nephrectomy. Status post prostatectomy.  No evidence of recurrent or metastatic disease.  Cholelithiasis, without associated inflammatory changes.   Impression and Plan:  75 year old man with:   1.   T4N1 high-grade urothelial carcinoma of the left renal pelvis diagnosed in May 2021.      The natural course of his disease was updated at this time and treatment options were reviewed. CT scan on January fourth 2022 was personally discussed with the patient today and showed no evidence of metastatic disease. Treatment options moving forward including continued active surveillance versus starting adjuvant therapy with Pembrolizumab. After discussion today, he opted to defer this option unless he develops metastatic disease in the future. I recommended repeating imaging studies in 4 months.   2. IV access: Port-A-Cath remains in place and will be flushed periodically.   3. Renal function surveillance:Creatinine clearance remained stable around 40 cc/min.   4.  Anemia: Hemoglobin continues to improve and nearly corrected at this time. His hemoglobin now up to 12.1.  7. Follow-up: He will return in 4 months for repeat follow-up.   30  minutes were spent on this visit. The time was spent on dedicated to reviewing imaging studies, disease status update and outlining future plan of care.     [name], MD 1/7/202212:46 PM

## 2020-05-08 DIAGNOSIS — C652 Malignant neoplasm of left renal pelvis: Secondary | ICD-10-CM | POA: Diagnosis not present

## 2020-05-08 DIAGNOSIS — C61 Malignant neoplasm of prostate: Secondary | ICD-10-CM | POA: Diagnosis not present

## 2020-05-09 ENCOUNTER — Ambulatory Visit (INDEPENDENT_AMBULATORY_CARE_PROVIDER_SITE_OTHER): Payer: PPO

## 2020-05-09 ENCOUNTER — Other Ambulatory Visit: Payer: Self-pay

## 2020-05-09 VITALS — BP 124/72 | HR 73 | Temp 97.8°F | Ht 72.0 in | Wt 207.3 lb

## 2020-05-09 DIAGNOSIS — Z0000 Encounter for general adult medical examination without abnormal findings: Secondary | ICD-10-CM | POA: Diagnosis not present

## 2020-05-09 NOTE — Progress Notes (Signed)
Subjective:   Lawrence Hall is a 75 y.o. male who presents for Medicare Annual/Subsequent preventive examination.    Review of Systems    N/A  Cardiac Risk Factors include: advanced age (>65men, >26 women);male gender;family history of premature cardiovascular disease;dyslipidemia;hypertension     Objective:    Today's Vitals   05/09/20 1403  BP: 124/72  Pulse: 73  Temp: 97.8 F (36.6 C)  TempSrc: Oral  SpO2: 96%  Weight: 207 lb 5 oz (94 kg)  Height: 6' (1.829 m)   Body mass index is 28.12 kg/m.  Advanced Directives 05/09/2020 01/04/2020 01/04/2020 01/01/2020 11/24/2019 11/17/2019 11/02/2019  Does Patient Have a Medical Advance Directive? Yes Yes Yes Yes Yes Yes Yes  Type of Paramedic of [place];Living will Healthcare Power of [place] of [place];Living will  Does patient want to make changes to medical advance directive? No - Patient declined No - Patient declined - - No - Patient declined No - Patient declined -  Copy of [place] in Chart? Yes - validated most recent copy scanned in chart (See row information) Yes - validated most recent copy scanned in chart (See row information) - Yes - validated most recent copy scanned in chart (See row information) - - -  Would patient like information on creating a medical advance directive? - - - - - - -  Pre-existing out of facility DNR order (yellow form or pink MOST form) - - - - - - -    Current Medications (verified) Outpatient Encounter Medications as of 05/09/2020  Medication Sig  . amLODipine (NORVASC) 2.5 MG tablet Take 1 tablet (2.5 mg total) by mouth daily. (Patient taking differently: Take 2.5 mg by mouth at bedtime.)  . aspirin EC 81 MG tablet Take 1 tablet (81 mg total) by mouth daily. Swallow whole.  [name] atorvastatin (LIPITOR) 80 MG tablet TAKE 1 TABLET ONCE DAILY AT 6 PM. (Patient taking differently:  Take 80 mg by mouth daily at 6 PM.)  . Biotin 1000 MCG tablet Take 1,000 mcg by mouth 3 (three) times daily.  . metoprolol succinate (TOPROL-XL) 50 MG 24 hr tablet Take 1 tablet (50 mg total) by mouth at bedtime.  . Multiple Vitamins-Minerals (IMMUNE SUPPORT VITAMIN C PO) Take by mouth.  . nitroGLYCERIN (NITROSTAT) 0.4 MG SL tablet 1 TAB UNDER TONGUE AS NEEDED FOR CHEST PAIN. MAY REPEAT EVERY 5 MIN FOR A TOTAL OF 3 DOSES. (Patient not taking: Reported on 05/09/2020)   No facility-administered encounter medications on file as of 05/09/2020.    Allergies (verified) Other   History: Past Medical History:  Diagnosis Date  . Arthritis    mild hands  . Asthma    pt denies 4/21   . Chest pain 07/25/2012  . Coronary artery disease   . Dysrhythmia    PAC'S, hx of atrial fib after OHS 04/2018  . ED (erectile dysfunction)   . H/O hiatal hernia   . Heart murmur   . Hypercholesterolemia   . Hypertension    MARGINALLY HIGH - NO MEDS -MONITORS  . Myocardial infarction ([place])    2017   . Pneumonia    hx of 2018   . PONV (postoperative nausea and vomiting)   . Prostate cancer ([place]) 03/27/2011   prostate bx=adenocarcinoma,  . Skin cancer 2008   basal cell face /removed  . Sleep apnea    borderline sleep apnea per patient  no devices   . Urothelial cancer [facility])    Past Surgical History:  Procedure Laterality Date  . AORTIC VALVE REPLACEMENT N/A 04/29/2018   Procedure: AORTIC VALVE REPLACEMENT (AVR) using INSPIRIS RESILIA  AORTIC VALVE 38mm;  Surgeon: Ivin Poot, MD;  Location: [place];  Service: Open Heart Surgery;  Laterality: N/A;  . basal cell cancer  2008   inside nose  . CARDIAC CATHETERIZATION N/A 06/03/2015   Procedure: Left Heart Cath and Coronary Angiography;  Surgeon: [name] Sine, MD;  Location: [place] [facility];  Service: Cardiovascular;  Laterality: N/A;  . CATARACT EXTRACTION, BILATERAL  2007  . CORONARY ARTERY BYPASS GRAFT N/A 04/29/2018   Procedure: CORONARY ARTERY  BYPASS GRAFTING (CABG) X 3; Using Left Internal Mammary Artery (LIMA) and Right Leg Greater Saphenous Vein Graft (SVG);  LIMA to LAD, SVG to OM1, SVG to RCA,;  Surgeon: Ivin Poot, MD;  Location: [place];  Service: Open Heart Surgery;  Laterality: N/A;  . CYSTOSCOPY/RETROGRADE/URETEROSCOPY Left 08/21/2019   Procedure: CYSTOSCOPY/RETROGRADE/URETEROSCOPY/  LEFT BIOPSY/BRUSH BIOPSY/RENAL WASHINGS/ AND STENT PLACEMENT;  Surgeon: Raynelle Bring, MD;  Location[facility];  Service: Urology;  Laterality: Left;  . IR IMAGING GUIDED PORT INSERTION  09/28/2019  . RIGHT/LEFT HEART CATH AND CORONARY ANGIOGRAPHY N/A 04/26/2018   Procedure: RIGHT/LEFT HEART CATH AND CORONARY ANGIOGRAPHY;  Surgeon: [name] Sine, MD;  Location: [place] [facility];  Service: Cardiovascular;  Laterality: N/A;  . ROBOT ASSISTED LAPAROSCOPIC RADICAL PROSTATECTOMY  08/10/2011   Procedure: ROBOTIC ASSISTED LAPAROSCOPIC RADICAL PROSTATECTOMY LEVEL 2;  Surgeon: Dutch Gray, MD;  [facility];  Service: Urology;  Laterality: N/A;  . ROBOT ASSITED LAPAROSCOPIC NEPHROURETERECTOMY Left 01/04/2020   Procedure: XI ROBOT ASSITED LAPAROSCOPIC NEPHROURETERECTOMY;  Surgeon: Raynelle Bring, MD;  Location[facility];  Service: Urology;  Laterality: Left;  NEEDS 240 MIN FOR PROCEDURE  . TEE WITHOUT CARDIOVERSION N/A 04/29/2018   Procedure: TRANSESOPHAGEAL ECHOCARDIOGRAM (TEE);  Surgeon: Prescott Gum, Collier Salina, MD;  Location: [place];  Service: Open Heart Surgery;  Laterality: N/A;  . THYROID SURGERY  infant   uncertain details; no thyroid replacement listed  . WISDOM TOOTH EXTRACTION     Family History  Problem Relation Age of Onset  . Prostate cancer Father 22       in his 70's/other comorbidities/79 deceased  . Alcohol abuse Father   . Liver disease Father   . Heart attack Father 28  . Breast cancer Mother 49       mets to bone,deceased age 12  . Cervical cancer Sister   . Cervical cancer Sister   . Breast cancer Sister   . CAD Brother        Died of  sudden cardiac death/MI   Social History   Socioeconomic History  . Marital status: Married    Spouse name: [name]   . Number of children: 0  . Years of education: 15 years   . Highest education level: Not on file  Occupational History    Employer: [employer]    Comment: customer service [employer]  Tobacco Use  . Smoking status: Never Smoker  . Smokeless tobacco: Never Used  Vaping Use  . Vaping Use: Never used  Substance and Sexual Activity  . Alcohol use: Not Currently  . Drug use: No  . Sexual activity: Not on file  Other Topics Concern  . Not on file  Social History Narrative  . Not on file   Social Determinants of Health   Financial  Resource Strain: Low Risk   . Difficulty of Paying Living Expenses: Not hard at all  Food Insecurity: No Food Insecurity  . Worried About Charity fundraiser in the Last Year: Never true  . Ran Out of Food in the Last Year: Never true  Transportation Needs: No Transportation Needs  . Lack of Transportation (Medical): No  . Lack of Transportation (Non-Medical): No  Physical Activity: Inactive  . Days of Exercise per Week: 0 days  . Minutes of Exercise per Session: 0 min  Stress: No Stress Concern Present  . Feeling of Stress : Not at all  Social Connections: Moderately Integrated  . Frequency of Communication with Friends and Family: Once a week  . Frequency of Social Gatherings with Friends and Family: Twice a week  . Attends Religious Services: 1 to 4 times per year  . Active Member of Clubs or Organizations: No  . Attends Archivist Meetings: Never  . Marital Status: Married    Tobacco Counseling Counseling given: Not Answered   Clinical Intake:  Pre-visit preparation completed: Yes  Pain : 0-10 Pain Type: Acute pain Pain Location: Shoulder Pain Orientation: Right Pain Descriptors / Indicators: Aching Pain Onset: More than a month ago Pain Frequency: Intermittent Pain Relieving Factors: ice, heating  pad  Pain Relieving Factors: ice, heating pad  Nutritional Risks: None Diabetes: No  How often do you need to have someone help you when you read instructions, pamphlets, or other written materials from your doctor or pharmacy?: 1 - Never What is the last grade level you completed in school?: 3 years of college  Diabetic? No   Interpreter Needed?: No  Information entered by :: [place] of Daily Living In your present state of health, do you have any difficulty performing the following activities: 05/09/2020 01/04/2020  Hearing? N N  Vision? N N  Difficulty concentrating or making decisions? N N  Walking or climbing stairs? N N  Dressing or bathing? N N  Doing errands, shopping? N N  Preparing Food and eating ? N -  Using the Toilet? N -  In the past six months, have you accidently leaked urine? N -  Do you have problems with loss of bowel control? N -  Managing your Medications? N -  Managing your Finances? N -  Housekeeping or managing your Housekeeping? N -  Some recent data might be hidden    Patient Care Team: [name], MD as PCP - General (Family Medicine) [name], Dani Gobble, MD as PCP - Cardiology (Cardiology) Raynelle Bring, MD as Consulting Physician (Urology)  Indicate any recent Medical Services you may have received from other than [facility] providers in the past year (date may be approximate).     Assessment:   This is a routine wellness examination for Howard.  Hearing/Vision screen  Hearing Screening   125Hz  250Hz  500Hz  1000Hz  2000Hz  3000Hz  4000Hz  6000Hz  8000Hz   Right ear:           Left ear:           Vision Screening Comments: Patient states has not gotten eyes examined in 3 years   Dietary issues and exercise activities discussed: Current Exercise Habits: The patient does not participate in regular exercise at present  Goals    . Exercise 3x per week (30 min per time)    . Weight (lb) < 185 lb (83.9 kg)      Depression  Screen [facility] 2/9 Scores 05/09/2020 01/24/2020 10/07/2015 07/08/2015  PHQ - 2 Score 0 0 0 0  PHQ- 9 Score 0 - - -    Fall Risk Fall Risk  05/09/2020 01/24/2020 11/17/2018 07/02/2015 07/02/2015  Falls in the past year? 0 0 (No Data) No No  Comment - - Emmi Telephone Survey: data to providers prior to load - -  Number falls in past yr: 0 0 (No Data) - -  Comment - - Emmi Telephone Survey Actual Response =  - -  Injury with Fall? 0 - - - -  Follow up Falls evaluation completed;Falls prevention discussed - - - -    FALL RISK PREVENTION PERTAINING TO THE HOME:  Any stairs in or around the home? Yes  If so, are there any without handrails? No  Home free of loose throw rugs in walkways, pet beds, electrical cords, etc? Yes  Adequate lighting in your home to reduce risk of falls? Yes   ASSISTIVE DEVICES UTILIZED TO PREVENT FALLS:  Life alert? No  Use of a cane, walker or w/c? No  Grab bars in the bathroom? No  Shower chair or bench in shower? No  Elevated toilet seat or a handicapped toilet? No     Cognitive Function:   Normal cognitive status assessed by direct observation by this Nurse Health Advisor. No abnormalities found. ;      Immunizations Immunization History  Administered Date(s) Administered  . Fluad Quad(high Dose 65+) 01/24/2020  . Influenza,inj,Quad PF,6+ Mos 06/04/2015  . PFIZER(Purple Top)SARS-COV-2 Vaccination 06/11/2019, 07/11/2019, 12/19/2019  . Pneumococcal Conjugate-13 04/20/2016  . Pneumococcal Polysaccharide-23 04/20/2017  . Tdap 04/20/2018    TDAP status: Up to date  Flu Vaccine status: Up to date  Pneumococcal vaccine status: Up to date  Covid-19 vaccine status: Completed vaccines  Qualifies for Shingles Vaccine? Yes   Zostavax completed No   Shingrix Completed?: No.    Education has been provided regarding the importance of this vaccine. Patient has been advised to call insurance company to determine out of pocket expense if they have not yet received  this vaccine. Advised may also receive vaccine at local pharmacy or Health Dept. Verbalized acceptance and understanding.  Screening Tests Health Maintenance  Topic Date Due  . Hepatitis C Screening  07/23/2020 (Originally 04-Oct-1945)  . COVID-19 Vaccine (4 - Booster for [place] series) 06/17/2020  . TETANUS/TDAP  04/20/2028  . INFLUENZA VACCINE  Completed  . PNA vac Low Risk Adult  Completed    Health Maintenance  There are no preventive care reminders to display for this patient.  Colorectal Cancer Screening: Patient would like to hold off on scheduling his colonoscopy at this time   Lung Cancer Screening: (Low Dose CT Chest recommended if Age 7-80 years, 30 pack-year currently smoking OR have quit w/in 15years.) does not qualify.   Lung Cancer Screening Referral: N/A   Additional Screening:  Hepatitis C Screening: does qualify;   Vision Screening: Recommended annual ophthalmology exams for early detection of glaucoma and other disorders of the eye. Is the patient up to date with their annual eye exam?  No  Who is the provider or what is the name of the office in which the patient attends annual eye exams? Does not currently have an eye doctor  If pt is not established with a provider, would they like to be referred to a provider to establish care? No .   Dental Screening: Recommended annual dental exams for proper oral hygiene  Community Resource Referral / Chronic Care Management: CRR required this  visit?  No   CCM required this visit?  No      Plan:     I have personally reviewed and noted the following in the patient's chart:   . Medical and social history . Use of alcohol, tobacco or illicit drugs  . Current medications and supplements . Functional ability and status . Nutritional status . Physical activity . Advanced directives . List of other physicians . Hospitalizations, surgeries, and ER visits in previous 12 months . Vitals . Screenings to include  cognitive, depression, and falls . Referrals and appointments  In addition, I have reviewed and discussed with patient certain preventive protocols, quality metrics, and best practice recommendations. A written personalized care plan for preventive services as well as general preventive health recommendations were provided to patient.     Ofilia Neas, LPN   [id number]   Nurse Notes: None

## 2020-05-09 NOTE — Patient Instructions (Signed)
Lawrence Hall , Thank you for taking time to come for your Medicare Wellness Visit. I appreciate your ongoing commitment to your health goals. Please review the following plan we discussed and let me know if I can assist you in the future.   Screening recommendations/referrals: Colonoscopy: Currently due for Colonoscopy. Please let us know when you are ready to get scheduled  Recommended yearly ophthalmology/optometry visit for glaucoma screening and checkup Recommended yearly dental visit for hygiene and checkup  Vaccinations: Influenza vaccine: up to date, next due fall 75  Pneumococcal vaccine: Completed series  Tdap vaccine: Up to date, next due 04/20/2028 Shingles vaccine: Currently due for Shingrix, if you wish to receive we recommend that you do so at your local pharmacy.    Advanced directives: Copies on file   Conditions/risks identified: None   Next appointment: 07/24/2020 @ 9:30 am with Dr. Ethlyn Gallery   Preventive Care 77 Years and Older, Male Preventive care refers to lifestyle choices and visits with your health care provider that can promote health and wellness. What does preventive care include?  A yearly physical exam. This is also called an annual well check.  Dental exams once or twice a year.  Routine eye exams. Ask your health care provider how often you should have your eyes checked.  Personal lifestyle choices, including:  Daily care of your teeth and gums.  Regular physical activity.  Eating a healthy diet.  Avoiding tobacco and drug use.  Limiting alcohol use.  Practicing safe sex.  Taking low doses of aspirin every day.  Taking vitamin and mineral supplements as recommended by your health care provider. What happens during an annual well check? The services and screenings done by your health care provider during your annual well check will depend on your age, overall health, lifestyle risk factors, and family history of disease. Counseling  Your  health care provider may ask you questions about your:  Alcohol use.  Tobacco use.  Drug use.  Emotional well-being.  Home and relationship well-being.  Sexual activity.  Eating habits.  History of falls.  Memory and ability to understand (cognition).  Work and work Statistician. Screening  You may have the following tests or measurements:  Height, weight, and BMI.  Blood pressure.  Lipid and cholesterol levels. These may be checked every 5 years, or more frequently if you are over 46 years old.  Skin check.  Lung cancer screening. You may have this screening every year starting at age 77 if you have a 30-pack-year history of smoking and currently smoke or have quit within the past 15 years.  Fecal occult blood test (FOBT) of the stool. You may have this test every year starting at age 77.  Flexible sigmoidoscopy or colonoscopy. You may have a sigmoidoscopy every 5 years or a colonoscopy every 10 years starting at age 77.  Prostate cancer screening. Recommendations will vary depending on your family history and other risks.  Hepatitis C blood test.  Hepatitis B blood test.  Sexually transmitted disease (STD) testing.  Diabetes screening. This is done by checking your blood sugar (glucose) after you have not eaten for a while (fasting). You may have this done every 1-3 years.  Abdominal aortic aneurysm (AAA) screening. You may need this if you are a current or former smoker.  Osteoporosis. You may be screened starting at age 77 if you are at high risk. Talk with your health care provider about your test results, treatment options, and if necessary, the need for  more tests. Vaccines  Your health care provider may recommend certain vaccines, such as:  Influenza vaccine. This is recommended every 77  Tetanus, diphtheria, and acellular pertussis (Tdap, Td) vaccine. You may need a Td booster every 10 years.  Zoster vaccine. You may need this after age  77.  Pneumococcal 13-valent conjugate (PCV13) vaccine. One dose is recommended after age 60.  Pneumococcal polysaccharide (PPSV23) vaccine. One dose is recommended after age 45. Talk to your health care provider about which screenings and vaccines you need and how often you need them. This information is not intended to replace advice given to you by your health care provider. Make sure you discuss any questions you have with your health care provider. Document Released: 05/03/2015 Document Revised: 12/25/2015 Document Reviewed: 02/05/2015 Elsevier Interactive Patient Education  2017 [employer] Prevention in the Home Falls can cause injuries. They can happen to people of all ages. There are many things you can do to make your home safe and to help prevent falls. What can I do on the outside of my home?  Regularly fix the edges of walkways and driveways and fix any cracks.  Remove anything that might make you trip as you walk through a door, such as a raised step or threshold.  Trim any bushes or trees on the path to your home.  Use bright outdoor lighting.  Clear any walking paths of anything that might make someone trip, such as rocks or tools.  Regularly check to see if handrails are loose or broken. Make sure that both sides of any steps have handrails.  Any raised decks and porches should have guardrails on the edges.  Have any leaves, snow, or ice cleared regularly.  Use sand or salt on walking paths during winter.  Clean up any spills in your garage right away. This includes oil or grease spills. What can I do in the bathroom?  Use night lights.  Install grab bars by the toilet and in the tub and shower. Do not use towel bars as grab bars.  Use non-skid mats or decals in the tub or shower.  If you need to sit down in the shower, use a plastic, non-slip stool.  Keep the floor dry. Clean up any water that spills on the floor as soon as it happens.  Remove  soap buildup in the tub or shower regularly.  Attach bath mats securely with double-sided non-slip rug tape.  Do not have throw rugs and other things on the floor that can make you trip. What can I do in the bedroom?  Use night lights.  Make sure that you have a light by your bed that is easy to reach.  Do not use any sheets or blankets that are too big for your bed. They should not hang down onto the floor.  Have a firm chair that has side arms. You can use this for support while you get dressed.  Do not have throw rugs and other things on the floor that can make you trip. What can I do in the kitchen?  Clean up any spills right away.  Avoid walking on wet floors.  Keep items that you use a lot in easy-to-reach places.  If you need to reach something above you, use a strong step stool that has a grab bar.  Keep electrical cords out of the way.  Do not use floor polish or wax that makes floors slippery. If you must use wax, use non-skid  floor wax.  Do not have throw rugs and other things on the floor that can make you trip. What can I do with my stairs?  Do not leave any items on the stairs.  Make sure that there are handrails on both sides of the stairs and use them. Fix handrails that are broken or loose. Make sure that handrails are as long as the stairways.  Check any carpeting to make sure that it is firmly attached to the stairs. Fix any carpet that is loose or worn.  Avoid having throw rugs at the top or bottom of the stairs. If you do have throw rugs, attach them to the floor with carpet tape.  Make sure that you have a light switch at the top of the stairs and the bottom of the stairs. If you do not have them, ask someone to add them for you. What else can I do to help prevent falls?  Wear shoes that:  Do not have high heels.  Have rubber bottoms.  Are comfortable and fit you well.  Are closed at the toe. Do not wear sandals.  If you use a  stepladder:  Make sure that it is fully opened. Do not climb a closed stepladder.  Make sure that both sides of the stepladder are locked into place.  Ask someone to hold it for you, if possible.  Clearly mark and make sure that you can see:  Any grab bars or handrails.  First and last steps.  Where the edge of each step is.  Use tools that help you move around (mobility aids) if they are needed. These include:  Canes.  Walkers.  Scooters.  Crutches.  Turn on the lights when you go into a dark area. Replace any light bulbs as soon as they burn out.  Set up your furniture so you have a clear path. Avoid moving your furniture around.  If any of your floors are uneven, fix them.  If there are any pets around you, be aware of where they are.  Review your medicines with your doctor. Some medicines can make you feel dizzy. This can increase your chance of falling. Ask your doctor what other things that you can do to help prevent falls. This information is not intended to replace advice given to you by your health care provider. Make sure you discuss any questions you have with your health care provider. Document Released: 01/31/2009 Document Revised: 09/12/2015 Document Reviewed: 05/11/2014 Elsevier Interactive Patient Education  2017 [employer].

## 2020-05-14 ENCOUNTER — Other Ambulatory Visit: Payer: Self-pay | Admitting: Medical

## 2020-06-21 ENCOUNTER — Other Ambulatory Visit: Payer: Self-pay

## 2020-06-21 ENCOUNTER — Inpatient Hospital Stay: Payer: PPO | Attending: Oncology

## 2020-06-21 DIAGNOSIS — Z452 Encounter for adjustment and management of vascular access device: Secondary | ICD-10-CM | POA: Insufficient documentation

## 2020-06-21 DIAGNOSIS — C652 Malignant neoplasm of left renal pelvis: Secondary | ICD-10-CM | POA: Insufficient documentation

## 2020-06-21 DIAGNOSIS — Z95828 Presence of other vascular implants and grafts: Secondary | ICD-10-CM

## 2020-06-21 MED ORDER — HEPARIN SOD (PORK) LOCK FLUSH 100 UNIT/ML IV SOLN
250.0000 [IU] | Freq: Once | INTRAVENOUS | Status: AC
  Administered 2020-06-21: 500 [IU]
  Filled 2020-06-21: qty 5

## 2020-06-21 MED ORDER — SODIUM CHLORIDE 0.9% FLUSH
10.0000 mL | Freq: Once | INTRAVENOUS | Status: AC
  Administered 2020-06-21: 10 mL
  Filled 2020-06-21: qty 10

## 2020-06-21 NOTE — Patient Instructions (Signed)

## 2020-07-23 ENCOUNTER — Other Ambulatory Visit: Payer: Self-pay

## 2020-07-24 ENCOUNTER — Encounter: Payer: Self-pay | Admitting: Family Medicine

## 2020-07-24 ENCOUNTER — Ambulatory Visit (INDEPENDENT_AMBULATORY_CARE_PROVIDER_SITE_OTHER): Payer: PPO | Admitting: Family Medicine

## 2020-07-24 VITALS — BP 122/70 | HR 67 | Temp 97.7°F | Ht 72.0 in | Wt 207.3 lb

## 2020-07-24 DIAGNOSIS — G8929 Other chronic pain: Secondary | ICD-10-CM | POA: Diagnosis not present

## 2020-07-24 DIAGNOSIS — M25511 Pain in right shoulder: Secondary | ICD-10-CM | POA: Diagnosis not present

## 2020-07-24 DIAGNOSIS — I10 Essential (primary) hypertension: Secondary | ICD-10-CM | POA: Diagnosis not present

## 2020-07-24 DIAGNOSIS — E785 Hyperlipidemia, unspecified: Secondary | ICD-10-CM

## 2020-07-24 DIAGNOSIS — Z0000 Encounter for general adult medical examination without abnormal findings: Secondary | ICD-10-CM

## 2020-07-24 DIAGNOSIS — I2510 Atherosclerotic heart disease of native coronary artery without angina pectoris: Secondary | ICD-10-CM

## 2020-07-24 NOTE — Patient Instructions (Signed)
Please have [facility] send [place] fax with shingles vaccine and 4th covid shot

## 2020-07-24 NOTE — Progress Notes (Signed)
Lawrence Hall DOB: [birth year] Encounter date: 07/24/2020  This is a 75 y.o. male who presents for complete physical   History of present illness/Additional concerns: Last visit with me was 01/2020  He is following with Dr. Alen Blew for T4N1 left renal pelvis urothelial carcinoma dx 08/2019. Completed 3 cycles of chemo and s/p robotic assisted laparoscopic nephroureterectomy and retroperitoneal lymphadenectomy 01/04/20. Currently under active surveillance with plans to repeat imaging in May.   Feeling great. Here with wife, Lawrence Hall today. She is helping with keeping him medically well. Monitoring diet, limiting sodium.   Pleased with Dr. Tamala Julian and evaluation; shoulder has for the most part done well. About 95% healed. Still has some inflammation and gets occasional discomfort upper shoulder. Lidocaine, 500mg  tylenol.   Has had dry cough for 3-4 years. Wife states may be allergy, may me medicine; not sure cause. Does have cat allergy and they do have cats.   Past Medical History:  Diagnosis Date  . Arthritis    mild hands  . Asthma    pt denies 4/21   . Chest pain 07/25/2012  . Coronary artery disease   . Dysrhythmia    PAC'S, hx of atrial fib after OHS 04/2018  . ED (erectile dysfunction)   . H/O hiatal hernia   . Heart murmur   . Hypercholesterolemia   . Hypertension    MARGINALLY HIGH - NO MEDS -MONITORS  . Myocardial infarction ([place])    2017   . Pneumonia    hx of 2018   . PONV (postoperative nausea and vomiting)   . Prostate cancer ([place]) 03/27/2011   prostate bx=adenocarcinoma,  . Skin cancer 2008   basal cell face /removed  . Sleep apnea    borderline sleep apnea per patient no devices   . Urothelial cancer [facility])    Past Surgical History:  Procedure Laterality Date  . AORTIC VALVE REPLACEMENT N/A 04/29/2018   Procedure: AORTIC VALVE REPLACEMENT (AVR) using INSPIRIS RESILIA  AORTIC VALVE 39mm;  Surgeon: Ivin Poot, MD;  Location: [facility];  Service: Open Heart Surgery;   Laterality: N/A;  . basal cell cancer  2008   inside nose  . CARDIAC CATHETERIZATION N/A 06/03/2015   Procedure: Left Heart Cath and Coronary Angiography;  Surgeon: [name] Sine, MD;  Location: [facility];  Service: Cardiovascular;  Laterality: N/A;  . CATARACT EXTRACTION, BILATERAL  2007  . CORONARY ARTERY BYPASS GRAFT N/A 04/29/2018   Procedure: CORONARY ARTERY BYPASS GRAFTING (CABG) X 3; Using Left Internal Mammary Artery (LIMA) and Right Leg Greater Saphenous Vein Graft (SVG);  LIMA to LAD, SVG to OM1, SVG to RCA,;  Surgeon: Ivin Poot, MD;  Location: [facility];  Service: Open Heart Surgery;  Laterality: N/A;  . CYSTOSCOPY/RETROGRADE/URETEROSCOPY Left 08/21/2019   Procedure: CYSTOSCOPY/RETROGRADE/URETEROSCOPY/  LEFT BIOPSY/BRUSH BIOPSY/RENAL WASHINGS/ AND STENT PLACEMENT;  Surgeon: Raynelle Bring, MD;  Location: [facility];  Service: Urology;  Laterality: Left;  . IR IMAGING GUIDED PORT INSERTION  09/28/2019  . RIGHT/LEFT HEART CATH AND CORONARY ANGIOGRAPHY N/A 04/26/2018   Procedure: RIGHT/LEFT HEART CATH AND CORONARY ANGIOGRAPHY;  Surgeon: [name] Sine, MD;  Location: [facility];  Service: Cardiovascular;  Laterality: N/A;  . ROBOT ASSISTED LAPAROSCOPIC RADICAL PROSTATECTOMY  08/10/2011   Procedure: ROBOTIC ASSISTED LAPAROSCOPIC RADICAL PROSTATECTOMY LEVEL 2;  Surgeon: Dutch Gray, MD;  [facility];  Service: Urology;  Laterality: N/A;  . ROBOT ASSITED LAPAROSCOPIC NEPHROURETERECTOMY Left 01/04/2020   Procedure: XI ROBOT ASSITED LAPAROSCOPIC NEPHROURETERECTOMY;  Surgeon: Raynelle Bring, MD;  Location[facility];  Service: Urology;  Laterality: Left;  NEEDS 240 MIN FOR PROCEDURE  . TEE WITHOUT CARDIOVERSION N/A 04/29/2018   Procedure: TRANSESOPHAGEAL ECHOCARDIOGRAM (TEE);  Surgeon: Prescott Gum, Collier Salina, MD;  Location: [facility];  Service: Open Heart Surgery;  Laterality: N/A;  . THYROID SURGERY  infant   uncertain details; no thyroid replacement listed  . WISDOM TOOTH EXTRACTION      Allergies  Allergen Reactions  . Other Itching and Other (See Comments)    Pet dander, seasonal allergies = Runny nose, itchy eyes   Current Meds  Medication Sig  . amLODipine (NORVASC) 2.5 MG tablet TAKE 1 TABLET ONCE DAILY.  [name] aspirin EC 81 MG tablet Take 1 tablet (81 mg total) by mouth daily. Swallow whole.  [name] atorvastatin (LIPITOR) 80 MG tablet TAKE 1 TABLET ONCE DAILY AT 6 PM. (Patient taking differently: Take 80 mg by mouth daily at 6 PM.)  . Biotin 1000 MCG tablet Take 1,000 mcg by mouth once a week.  . metoprolol succinate (TOPROL-XL) 50 MG 24 hr tablet Take 1 tablet (50 mg total) by mouth at bedtime.  . Multiple Vitamins-Minerals (IMMUNE SUPPORT VITAMIN C PO) Take by mouth.  . nitroGLYCERIN (NITROSTAT) 0.4 MG SL tablet 1 TAB UNDER TONGUE AS NEEDED FOR CHEST PAIN. MAY REPEAT EVERY 5 MIN FOR A TOTAL OF 3 DOSES.   Social History   Tobacco Use  . Smoking status: Never Smoker  . Smokeless tobacco: Never Used  Substance Use Topics  . Alcohol use: Not Currently   Family History  Problem Relation Age of Onset  . Prostate cancer Father 24       in his 70's/other comorbidities/79 deceased  . Alcohol abuse Father   . Liver disease Father   . Heart attack Father 64  . Breast cancer Mother 29       mets to bone,deceased age 16  . Cervical cancer Sister   . Cervical cancer Sister   . Breast cancer Sister   . CAD Brother        Died of sudden cardiac death/MI     Review of Systems  Constitutional: Negative for activity change, appetite change, chills, fatigue, fever and unexpected weight change.  HENT: Negative for congestion, ear pain, hearing loss, sinus pressure, sinus pain, sore throat and trouble swallowing.   Eyes: Negative for pain and visual disturbance.  Respiratory: Negative for cough, chest tightness, shortness of breath and wheezing.   Cardiovascular: Negative for chest pain, palpitations and leg swelling.  Gastrointestinal: Negative for abdominal distention,  abdominal pain, blood in stool, constipation, diarrhea, nausea and vomiting.  Genitourinary: Negative for decreased urine volume, difficulty urinating, dysuria, penile pain and testicular pain.  Musculoskeletal: Positive for arthralgias. Negative for back pain and joint swelling.       Abduction limited at 90 degrees right shoulder.   Skin: Negative for rash.  Neurological: Negative for dizziness, weakness, numbness and headaches.  Hematological: Negative for adenopathy. Does not bruise/bleed easily.  Psychiatric/Behavioral: Negative for agitation, sleep disturbance and suicidal ideas. The patient is not nervous/anxious.     CBC:  Lab Results  Component Value Date   WBC 5.7 04/23/2020   WBC 6.3 01/01/2020   HGB 12.1 (L) 04/23/2020   HCT 35.8 (L) 04/23/2020   MCH 29.2 04/23/2020   MCHC 33.8 04/23/2020   RDW 12.9 04/23/2020   PLT 189 04/23/2020   CMP: Lab Results  Component Value Date   NA 139 04/23/2020  NA 137 04/07/2019   K 4.4 04/23/2020   CL 107 04/23/2020   CO2 23 04/23/2020   ANIONGAP 9 04/23/2020   GLUCOSE 87 04/23/2020   BUN 31 (H) 04/23/2020   BUN 29 (H) 04/07/2019   CREATININE 1.79 (H) 04/23/2020   LABGLOB 2.8 04/07/2019   GFRAA 43 (L) 01/05/2020   GFRAA >60 11/24/2019   CALCIUM 9.0 04/23/2020   PROT 7.3 04/23/2020   PROT 6.9 04/07/2019   AGRATIO 1.5 04/07/2019   BILITOT 1.1 04/23/2020   ALKPHOS 65 04/23/2020   ALT 15 04/23/2020   AST 17 04/23/2020   LIPID: Lab Results  Component Value Date   CHOL 136 04/07/2019   TRIG 127 04/07/2019   HDL 42 04/07/2019   LDLCALC 71 04/07/2019   LABVLDL 23 04/07/2019    Objective:  BP 122/70 (BP Location: Left Arm, Patient Position: Sitting, Cuff Size: Large)   Pulse 67   Temp 97.7 F (36.5 C) (Oral)   Ht 6' (1.829 m)   Wt 207 lb 4.8 oz (94 kg)   SpO2 97%   BMI 28.11 kg/m   Weight: 207 lb 4.8 oz (94 kg)   BP Readings from Last 3 Encounters:  07/24/20 122/70  05/09/20 124/72  04/26/20 (!) 146/67   Wt  Readings from Last 3 Encounters:  07/24/20 207 lb 4.8 oz (94 kg)  05/09/20 207 lb 5 oz (94 kg)  04/26/20 208 lb 11.2 oz (94.7 kg)    Physical Exam Constitutional:      General: He is not in acute distress.    Appearance: He is well-developed.  HENT:     Head: Normocephalic and atraumatic.     Right Ear: External ear normal.     Left Ear: External ear normal.     Nose: Nose normal.     Mouth/Throat:     Pharynx: No oropharyngeal exudate.  Eyes:     Conjunctiva/sclera: Conjunctivae normal.     Pupils: Pupils are equal, round, and reactive to light.  Neck:     Thyroid: No thyromegaly.  Cardiovascular:     Rate and Rhythm: Normal rate and regular rhythm.     Heart sounds: Normal heart sounds. No murmur heard. No friction rub. No gallop.   Pulmonary:     Effort: Pulmonary effort is normal. No respiratory distress.     Breath sounds: Normal breath sounds. No stridor. No wheezing or rales.  Abdominal:     General: Bowel sounds are normal.     Palpations: Abdomen is soft.  Musculoskeletal:        General: Normal range of motion.     Cervical back: Neck supple.  Skin:    General: Skin is warm and dry.  Neurological:     Mental Status: He is alert and oriented to person, place, and time.  Psychiatric:        Behavior: Behavior normal.        Thought Content: Thought content normal.        Judgment: Judgment normal.     Assessment/Plan: Health Maintenance Due  Topic Date Due  . COVID-19 Vaccine (4 - Booster for Pfizer series) 06/17/2020   Health Maintenance reviewed.  1. Preventative health care Keep up with healthy lifestyle, regular exercise. He is planning on getting 4th COVID in next week.  2. Essential hypertension Well controlled. Continue current medications.   3. Coronary artery disease involving native coronary artery of native heart without angina pectoris Follows with cardiology. Continue to monitor blood  pressure and cholesterol control. He will ask for  lipid panel at one of specialty visits.   4. Hyperlipidemia, unspecified hyperlipidemia type Continue lipitor 80mg  dialy - Lipid panel; Future  5. Chronic right shoulder pain He is going to work on exercises from Dr. Tamala Julian for at least a month and see how he progresses. Ok to refer to PT if he feels he is not making progress. We discussed goals - improved ROM - closer to other side - and pain control. He will ice after activity.   Return in about 6 months (around 01/23/2021) for Chronic condition visit.  [name], MD

## 2020-07-25 ENCOUNTER — Other Ambulatory Visit: Payer: Self-pay | Admitting: Cardiovascular Disease

## 2020-08-13 ENCOUNTER — Encounter: Payer: Self-pay | Admitting: Family Medicine

## 2020-08-20 NOTE — Progress Notes (Signed)
[facility] [address] [place] Phone: [phone] Subjective:   I Lawrence Hall am serving as a Education administrator for Dr. [name].  This visit occurred during the SARS-CoV-2 public health emergency.  Safety protocols were in place, including screening questions prior to the visit, additional usage of staff PPE, and extensive cleaning of exam room while observing appropriate contact time as indicated for disinfecting solutions.   I'm seeing this patient by the request  of:  [name], MD  CC: shoulder pain   OAC:[id number]   10/31/2019 Patient does have more of a rotator cuff tear.  Still noted some mild retraction of 1.5 cm.  With patient though having a chemotherapy I do not think that this changes any true medical management at this moment.  Patient wants to hold on any formal physical therapy still at this time.  Follow-up with me again 6 weeks.  At that time we will discuss either starting physical therapy or if any increasing weakness or pain MRI so patient does have the opportunity for potential surgical intervention if necessary.  Update 08/21/2020 Lawrence Hall is a 75 y.o. male coming in with complaint of right shoulder pain.  Patient was found to have a rotator cuff tear.  Last time we saw patient he was going through chemotherapy and we did discuss the possibility of MRI or physical therapy.  Patient was lost to follow-up.  Patient states he would like to know where he is in the healing process. States some days (5/7) he is 90% and other days (2/7) he is 70%. States that he had multiple tears in his shoulder. Was prescribed exercises and had xrays. Has been mainly stretching and doing weightless curls 3x15. States he still has inflammation. Certain movements will cause sharp pains. States he ices the shoulder before the bed, and uses topical cream. Inflammation mostly with activity. Has some questions about shoulder injections and side  effects.        Past Medical History:  Diagnosis Date  . Arthritis    mild hands  . Asthma    pt denies 4/21   . Chest pain 07/25/2012  . Coronary artery disease   . Dysrhythmia    PAC'S, hx of atrial fib after OHS 04/2018  . ED (erectile dysfunction)   . H/O hiatal hernia   . Heart murmur   . Hypercholesterolemia   . Hypertension    MARGINALLY HIGH - NO MEDS -MONITORS  . Myocardial infarction ([place])    2017   . Pneumonia    hx of 2018   . PONV (postoperative nausea and vomiting)   . Prostate cancer ([place]) 03/27/2011   prostate bx=adenocarcinoma,  . Skin cancer 2008   basal cell face /removed  . Sleep apnea    borderline sleep apnea per patient no devices   . Urothelial cancer [facility])    Past Surgical History:  Procedure Laterality Date  . AORTIC VALVE REPLACEMENT N/A 04/29/2018   Procedure: AORTIC VALVE REPLACEMENT (AVR) using INSPIRIS RESILIA  AORTIC VALVE 77mm;  Surgeon: Ivin Poot, MD;  Location: [facility];  Service: Open Heart Surgery;  Laterality: N/A;  . basal cell cancer  2008   inside nose  . CARDIAC CATHETERIZATION N/A 06/03/2015   Procedure: Left Heart Cath and Coronary Angiography;  Surgeon: [name] Sine, MD;  Location: [facility];  Service: Cardiovascular;  Laterality: N/A;  . CATARACT EXTRACTION, BILATERAL  2007  . CORONARY ARTERY  BYPASS GRAFT N/A 04/29/2018   Procedure: CORONARY ARTERY BYPASS GRAFTING (CABG) X 3; Using Left Internal Mammary Artery (LIMA) and Right Leg Greater Saphenous Vein Graft (SVG);  LIMA to LAD, SVG to OM1, SVG to RCA,;  Surgeon: Ivin Poot, MD;  Location: [facility];  Service: Open Heart Surgery;  Laterality: N/A;  . CYSTOSCOPY/RETROGRADE/URETEROSCOPY Left 08/21/2019   Procedure: CYSTOSCOPY/RETROGRADE/URETEROSCOPY/  LEFT BIOPSY/BRUSH BIOPSY/RENAL WASHINGS/ AND STENT PLACEMENT;  Surgeon: Raynelle Bring, MD;  Location[facility];  Service: Urology;  Laterality: Left;  . IR IMAGING GUIDED PORT INSERTION  09/28/2019  . RIGHT/LEFT HEART  CATH AND CORONARY ANGIOGRAPHY N/A 04/26/2018   Procedure: RIGHT/LEFT HEART CATH AND CORONARY ANGIOGRAPHY;  Surgeon: [name] Sine, MD;  Location: [facility];  Service: Cardiovascular;  Laterality: N/A;  . ROBOT ASSISTED LAPAROSCOPIC RADICAL PROSTATECTOMY  08/10/2011   Procedure: ROBOTIC ASSISTED LAPAROSCOPIC RADICAL PROSTATECTOMY LEVEL 2;  Surgeon: Dutch Gray, MD;  Location[facility];  Service: Urology;  Laterality: N/A;  . ROBOT ASSITED LAPAROSCOPIC NEPHROURETERECTOMY Left 01/04/2020   Procedure: XI ROBOT ASSITED LAPAROSCOPIC NEPHROURETERECTOMY;  Surgeon: Raynelle Bring, MD;  Location[facility];  Service: Urology;  Laterality: Left;  NEEDS 240 MIN FOR PROCEDURE  . TEE WITHOUT CARDIOVERSION N/A 04/29/2018   Procedure: TRANSESOPHAGEAL ECHOCARDIOGRAM (TEE);  Surgeon: Prescott Gum, Collier Salina, MD;  Location: [facility];  Service: Open Heart Surgery;  Laterality: N/A;  . THYROID SURGERY  infant   uncertain details; no thyroid replacement listed  . WISDOM TOOTH EXTRACTION     Social History   Socioeconomic History  . Marital status: Married    Spouse name: [name]   . Number of children: 0  . Years of education: 15 years   . Highest education level: Not on file  Occupational History    Employer: [employer]    Comment: customer service [employer]  Tobacco Use  . Smoking status: Never Smoker  . Smokeless tobacco: Never Used  Vaping Use  . Vaping Use: Never used  Substance and Sexual Activity  . Alcohol use: Not Currently  . Drug use: No  . Sexual activity: Not on file  Other Topics Concern  . Not on file  Social History Narrative  . Not on file   Social Determinants of Health   Financial Resource Strain: Low Risk   . Difficulty of Paying Living Expenses: Not hard at all  Food Insecurity: No Food Insecurity  . Worried About Charity fundraiser in the Last Year: Never true  . Ran Out of Food in the Last Year: Never true  Transportation Needs: No Transportation Needs  . Lack of Transportation  (Medical): No  . Lack of Transportation (Non-Medical): No  Physical Activity: Inactive  . Days of Exercise per Week: 0 days  . Minutes of Exercise per Session: 0 min  Stress: No Stress Concern Present  . Feeling of Stress : Not at all  Social Connections: Moderately Integrated  . Frequency of Communication with Friends and Family: Once a week  . Frequency of Social Gatherings with Friends and Family: Twice a week  . Attends Religious Services: 1 to 4 times per year  . Active Member of Clubs or Organizations: No  . Attends Archivist Meetings: Never  . Marital Status: Married   Allergies  Allergen Reactions  . Other Itching and Other (See Comments)    Pet dander, seasonal allergies = Runny nose, itchy eyes   Family History  Problem Relation Age of Onset  . Prostate cancer  Father 21       in his 70's/other comorbidities/79 deceased  . Alcohol abuse Father   . Liver disease Father   . Heart attack Father 35  . Breast cancer Mother 80       mets to bone,deceased age 51  . Cervical cancer Sister   . Cervical cancer Sister   . Breast cancer Sister   . CAD Brother        Died of sudden cardiac death/MI     Current Outpatient Medications (Cardiovascular):  .  amLODipine (NORVASC) 2.5 MG tablet, TAKE 1 TABLET ONCE DAILY. [name]  atorvastatin (LIPITOR) 80 MG tablet, TAKE 1 TABLET ONCE DAILY AT 6 PM. .  metoprolol succinate (TOPROL-XL) 50 MG 24 hr tablet, Take 1 tablet (50 mg total) by mouth at bedtime. .  nitroGLYCERIN (NITROSTAT) 0.4 MG SL tablet, 1 TAB UNDER TONGUE AS NEEDED FOR CHEST PAIN. MAY REPEAT EVERY 5 MIN FOR A TOTAL OF 3 DOSES.   Current Outpatient Medications (Analgesics):  .  aspirin EC 81 MG tablet, Take 1 tablet (81 mg total) by mouth daily. Swallow whole.   Current Outpatient Medications (Other):  .  Biotin 1000 MCG tablet, Take 1,000 mcg by mouth once a week. .  Multiple Vitamins-Minerals (IMMUNE SUPPORT VITAMIN C PO), Take by mouth.   Reviewed prior  external information including notes and imaging from  primary care provider As well as notes that were available from care everywhere and other healthcare systems.  Past medical history, social, surgical and family history all reviewed in electronic medical record.  No pertanent information unless stated regarding to the chief complaint.   Review of Systems:  No headache, visual changes, nausea, vomiting, diarrhea, constipation, dizziness, abdominal pain, skin rash, fevers, chills, night sweats, weight loss, swollen lymph nodes, body aches, joint swelling, chest pain, shortness of breath, mood changes. POSITIVE muscle aches  Objective  Blood pressure 130/80, pulse 75, height 6' (1.829 m), weight 210 lb (95.3 kg), SpO2 96 %.   General: No apparent distress alert and oriented x3 mood and affect normal, dressed appropriately.  HEENT: Pupils equal, extraocular movements intact  Respiratory: Patient's speak in full sentences and does not appear short of breath  Cardiovascular: No lower extremity edema, non tender, no erythema  Gait normal with good balance and coordination.  MSK: Right shoulder exam shows the patient does have a high riding humeral head.  Patient has significant weakness noted with 3 out of 5 strength of the rotator cuff compared to the contralateral side.  Positive empty can.  Positive drop arm.  Weakness with internal and external rotation as well.  Neurovascular intact distally.    Impression and Recommendations:     The above documentation has been reviewed and is accurate and complete Lyndal Pulley, DO

## 2020-08-21 ENCOUNTER — Other Ambulatory Visit: Payer: Self-pay

## 2020-08-21 ENCOUNTER — Ambulatory Visit (INDEPENDENT_AMBULATORY_CARE_PROVIDER_SITE_OTHER): Payer: PPO

## 2020-08-21 ENCOUNTER — Encounter: Payer: Self-pay | Admitting: Family Medicine

## 2020-08-21 ENCOUNTER — Ambulatory Visit: Payer: PPO | Admitting: Family Medicine

## 2020-08-21 VITALS — BP 130/80 | HR 75 | Ht 72.0 in | Wt 210.0 lb

## 2020-08-21 DIAGNOSIS — M25511 Pain in right shoulder: Secondary | ICD-10-CM | POA: Diagnosis not present

## 2020-08-21 DIAGNOSIS — G8929 Other chronic pain: Secondary | ICD-10-CM | POA: Diagnosis not present

## 2020-08-21 DIAGNOSIS — S46011A Strain of muscle(s) and tendon(s) of the rotator cuff of right shoulder, initial encounter: Secondary | ICD-10-CM | POA: Diagnosis not present

## 2020-08-21 NOTE — Patient Instructions (Addendum)
Good to see you MRI without contrast for right shoulder Ok to continue ice and voltaren New xray on the shoulder today on the way out [phone] to schedule MRI We will contact you with results

## 2020-08-21 NOTE — Assessment & Plan Note (Signed)
Patient does have a right rotator cuff tear.  We will get x-rays secondary to be history of cancer.  I do feel an MRI is necessary secondary to the weakness.  Patient was lost to follow-up secondary to his treatment for his cancer.  We will get an MRI with and without contrast secondary to patient only having 1 kidney.  See how patient's retraction is at this point.  Depending on findings we will see if patient could potentially still have any surgical intervention or if we need to consider more of a conservative approach.  Follow-up with me after the imaging.

## 2020-08-26 ENCOUNTER — Other Ambulatory Visit: Payer: Self-pay

## 2020-08-26 ENCOUNTER — Encounter: Payer: Self-pay | Admitting: Family Medicine

## 2020-08-26 DIAGNOSIS — M79601 Pain in right arm: Secondary | ICD-10-CM

## 2020-08-26 NOTE — Progress Notes (Unsigned)
Rig h

## 2020-08-27 ENCOUNTER — Other Ambulatory Visit: Payer: Self-pay

## 2020-08-27 ENCOUNTER — Ambulatory Visit (HOSPITAL_COMMUNITY)
Admission: RE | Admit: 2020-08-27 | Discharge: 2020-08-27 | Disposition: A | Discharge status: Home / Self Care | Payer: PPO | Source: Ambulatory Visit | Attending: Family Medicine | Admitting: Family Medicine

## 2020-08-27 ENCOUNTER — Encounter: Payer: Self-pay | Admitting: Family Medicine

## 2020-08-27 ENCOUNTER — Ambulatory Visit (HOSPITAL_BASED_OUTPATIENT_CLINIC_OR_DEPARTMENT_OTHER)
Admission: RE | Admit: 2020-08-27 | Discharge: 2020-08-27 | Disposition: A | Discharge status: Home / Self Care | Payer: PPO | Source: Ambulatory Visit | Attending: Family Medicine | Admitting: Family Medicine

## 2020-08-27 DIAGNOSIS — M79601 Pain in right arm: Secondary | ICD-10-CM | POA: Diagnosis not present

## 2020-08-27 DIAGNOSIS — M19011 Primary osteoarthritis, right shoulder: Secondary | ICD-10-CM | POA: Diagnosis not present

## 2020-08-27 DIAGNOSIS — Z905 Acquired absence of kidney: Secondary | ICD-10-CM | POA: Diagnosis not present

## 2020-08-27 DIAGNOSIS — M898X2 Other specified disorders of bone, upper arm: Secondary | ICD-10-CM | POA: Diagnosis not present

## 2020-08-27 DIAGNOSIS — R600 Localized edema: Secondary | ICD-10-CM | POA: Diagnosis not present

## 2020-08-28 ENCOUNTER — Encounter: Payer: Self-pay | Admitting: Family Medicine

## 2020-08-28 ENCOUNTER — Other Ambulatory Visit: Payer: Self-pay

## 2020-08-28 DIAGNOSIS — C4001 Malignant neoplasm of scapula and long bones of right upper limb: Secondary | ICD-10-CM

## 2020-08-28 DIAGNOSIS — C7951 Secondary malignant neoplasm of bone: Secondary | ICD-10-CM

## 2020-08-30 ENCOUNTER — Other Ambulatory Visit: Payer: PPO

## 2020-08-30 ENCOUNTER — Inpatient Hospital Stay: Payer: PPO | Attending: Oncology

## 2020-08-30 ENCOUNTER — Other Ambulatory Visit: Payer: Self-pay

## 2020-08-30 DIAGNOSIS — Z95828 Presence of other vascular implants and grafts: Secondary | ICD-10-CM

## 2020-08-30 DIAGNOSIS — Z79899 Other long term (current) drug therapy: Secondary | ICD-10-CM | POA: Diagnosis not present

## 2020-08-30 DIAGNOSIS — C7951 Secondary malignant neoplasm of bone: Secondary | ICD-10-CM | POA: Diagnosis not present

## 2020-08-30 DIAGNOSIS — Z7982 Long term (current) use of aspirin: Secondary | ICD-10-CM | POA: Diagnosis not present

## 2020-08-30 DIAGNOSIS — C787 Secondary malignant neoplasm of liver and intrahepatic bile duct: Secondary | ICD-10-CM | POA: Diagnosis not present

## 2020-08-30 DIAGNOSIS — C679 Malignant neoplasm of bladder, unspecified: Secondary | ICD-10-CM

## 2020-08-30 DIAGNOSIS — Z5112 Encounter for antineoplastic immunotherapy: Secondary | ICD-10-CM | POA: Diagnosis not present

## 2020-08-30 DIAGNOSIS — C652 Malignant neoplasm of left renal pelvis: Secondary | ICD-10-CM | POA: Insufficient documentation

## 2020-08-30 DIAGNOSIS — D508 Other iron deficiency anemias: Secondary | ICD-10-CM | POA: Diagnosis not present

## 2020-08-30 LAB — CBC WITH DIFFERENTIAL (CANCER CENTER ONLY)
Abs Immature Granulocytes: 0.02 10*3/uL (ref 0.00–0.07)
Basophils Absolute: 0.1 10*3/uL (ref 0.0–0.1)
Basophils Relative: 1 %
Eosinophils Absolute: 0.1 10*3/uL (ref 0.0–0.5)
Eosinophils Relative: 2 %
HCT: 32.3 % — ABNORMAL LOW (ref 39.0–52.0)
Hemoglobin: 11.1 g/dL — ABNORMAL LOW (ref 13.0–17.0)
Immature Granulocytes: 0 %
Lymphocytes Relative: 21 %
Lymphs Abs: 1.3 10*3/uL (ref 0.7–4.0)
MCH: 29.6 pg (ref 26.0–34.0)
MCHC: 34.4 g/dL (ref 30.0–36.0)
MCV: 86.1 fL (ref 80.0–100.0)
Monocytes Absolute: 0.6 10*3/uL (ref 0.1–1.0)
Monocytes Relative: 10 %
Neutro Abs: 4.1 10*3/uL (ref 1.7–7.7)
Neutrophils Relative %: 66 %
Platelet Count: 187 10*3/uL (ref 150–400)
RBC: 3.75 MIL/uL — ABNORMAL LOW (ref 4.22–5.81)
RDW: 12.9 % (ref 11.5–15.5)
WBC Count: 6.2 10*3/uL (ref 4.0–10.5)
nRBC: 0 % (ref 0.0–0.2)

## 2020-08-30 LAB — CMP (CANCER CENTER ONLY)
ALT: 12 U/L (ref 0–44)
AST: 22 U/L (ref 15–41)
Albumin: 3.8 g/dL (ref 3.5–5.0)
Alkaline Phosphatase: 81 U/L (ref 38–126)
Anion gap: 8 (ref 5–15)
BUN: 24 mg/dL — ABNORMAL HIGH (ref 8–23)
CO2: 25 mmol/L (ref 22–32)
Calcium: 8.9 mg/dL (ref 8.9–10.3)
Chloride: 105 mmol/L (ref 98–111)
Creatinine: 1.56 mg/dL — ABNORMAL HIGH (ref 0.61–1.24)
GFR, Estimated: 45 mL/min — ABNORMAL LOW (ref >60)
Glucose, Bld: 95 mg/dL (ref 70–99)
Potassium: 4.4 mmol/L (ref 3.5–5.1)
Sodium: 138 mmol/L (ref 135–145)
Total Bilirubin: 0.6 mg/dL (ref 0.3–1.2)
Total Protein: 7.2 g/dL (ref 6.5–8.1)

## 2020-08-30 MED ORDER — SODIUM CHLORIDE 0.9% FLUSH
10.0000 mL | Freq: Once | INTRAVENOUS | Status: AC
  Administered 2020-08-30: 10 mL
  Filled 2020-08-30: qty 10

## 2020-08-30 MED ORDER — HEPARIN SOD (PORK) LOCK FLUSH 100 UNIT/ML IV SOLN
500.0000 [IU] | Freq: Once | INTRAVENOUS | Status: AC
Start: 2020-08-30 — End: 2020-08-30
  Administered 2020-08-30: 500 [IU]
  Filled 2020-08-30: qty 5

## 2020-08-31 ENCOUNTER — Ambulatory Visit
Admission: RE | Admit: 2020-08-31 | Discharge: 2020-08-31 | Disposition: A | Discharge status: Home / Self Care | Payer: PPO | Source: Ambulatory Visit | Attending: Family Medicine | Admitting: Family Medicine

## 2020-08-31 DIAGNOSIS — M25511 Pain in right shoulder: Secondary | ICD-10-CM | POA: Diagnosis not present

## 2020-08-31 DIAGNOSIS — G8929 Other chronic pain: Secondary | ICD-10-CM

## 2020-09-03 ENCOUNTER — Encounter (HOSPITAL_COMMUNITY): Payer: Self-pay

## 2020-09-03 ENCOUNTER — Ambulatory Visit (HOSPITAL_COMMUNITY)
Admission: RE | Admit: 2020-09-03 | Discharge: 2020-09-03 | Disposition: A | Discharge status: Home / Self Care | Payer: PPO | Source: Ambulatory Visit | Attending: Oncology | Admitting: Oncology

## 2020-09-03 ENCOUNTER — Other Ambulatory Visit: Payer: Self-pay

## 2020-09-03 DIAGNOSIS — I77810 Thoracic aortic ectasia: Secondary | ICD-10-CM | POA: Diagnosis not present

## 2020-09-03 DIAGNOSIS — Z8546 Personal history of malignant neoplasm of prostate: Secondary | ICD-10-CM | POA: Diagnosis not present

## 2020-09-03 DIAGNOSIS — C652 Malignant neoplasm of left renal pelvis: Secondary | ICD-10-CM

## 2020-09-03 DIAGNOSIS — K8020 Calculus of gallbladder without cholecystitis without obstruction: Secondary | ICD-10-CM | POA: Diagnosis not present

## 2020-09-03 DIAGNOSIS — I700 Atherosclerosis of aorta: Secondary | ICD-10-CM | POA: Diagnosis not present

## 2020-09-03 DIAGNOSIS — K8080 Other cholelithiasis without obstruction: Secondary | ICD-10-CM | POA: Diagnosis not present

## 2020-09-03 HISTORY — DX: Secondary malignant neoplasm of bone: C79.51

## 2020-09-06 ENCOUNTER — Inpatient Hospital Stay: Payer: PPO | Admitting: Oncology

## 2020-09-06 ENCOUNTER — Encounter: Payer: Self-pay | Admitting: Family Medicine

## 2020-09-06 ENCOUNTER — Other Ambulatory Visit: Payer: Self-pay

## 2020-09-06 VITALS — BP 145/74 | HR 74 | Temp 98.0°F | Resp 17 | Wt 203.8 lb

## 2020-09-06 DIAGNOSIS — Z5112 Encounter for antineoplastic immunotherapy: Secondary | ICD-10-CM | POA: Diagnosis not present

## 2020-09-06 DIAGNOSIS — C679 Malignant neoplasm of bladder, unspecified: Secondary | ICD-10-CM | POA: Diagnosis not present

## 2020-09-06 DIAGNOSIS — Z7189 Other specified counseling: Secondary | ICD-10-CM | POA: Diagnosis not present

## 2020-09-06 NOTE — Progress Notes (Signed)
DISCONTINUE ON PATHWAY REGIMEN - Bladder     A cycle is every 21 days:     Gemcitabine      Cisplatin   **Always confirm dose/schedule in your pharmacy ordering system**  REASON: Disease Progression PRIOR TREATMENT: [id number]: Gemcitabine 1,000 mg/m2 D1, 8 + Cisplatin 70 mg/m2 D1 q21 Days x 4 Cycles TREATMENT RESPONSE: Partial Response (PR)  START ON PATHWAY REGIMEN - Bladder     A cycle is every 21 days:     Pembrolizumab   **Always confirm dose/schedule in your pharmacy ordering system**  Patient Characteristics: Advanced/Metastatic Disease, Second Line, FGFR2/FGFR3 Mutation Negative or Unknown, Prior Platinum-Based Therapy and No Prior PD-1/PD-L1 Inhibitor Therapeutic Status: Advanced/Metastatic Disease Line of Therapy: Second Line FGFR2/FGFR3 Mutation Status: Did Not Order Test Intent of Therapy: Non-Curative / Palliative Intent, Discussed with Patient

## 2020-09-06 NOTE — Progress Notes (Signed)
Hematology and Oncology Follow Up Visit  JADIEL SCHMIEDER [id number] 05/12/1945 75 y.o. 09/06/2020 8:37 AM [name], MDKoberlein, [name], MD   Principle Diagnosis: 75 year old with left renal pelvis urothelial carcinoma diagnosed in May 2021.  He was found to have T4N1 disease and developed stage IV in the bone and hepatic involvement.  Prior Therapy: He is status post biopsy obtained on Aug 21, 2019 which showed high-grade urothelial carcinoma of the left renal pelvis.   He status post neoadjuvant chemotherapy utilizing gemcitabine and cisplatin given on day 1 and cisplatin given on day 8 start her on 10/06/2019.   He completed 3 cycles of therapy in August 2021.  He is status post left robotic assisted laparoscopic nephroureterectomy and retroperitoneal lymphadenectomy completed on 01/04/2020.  He was found to have a residual T4 disease with 1 out of 5 lymph nodes involved.  Current therapy: Under consideration for additional therapy.   Interim History: Mr. Dalesandro is here for return evaluation.  Since the last visit, he was evaluated for right arm and shoulder pain.  MRI of the humerus obtained on Aug 27, 2020 showed a 4.8 x 4.2 x 3.2 cm lytic lesion involving the mid to right humerus.  He had completed staging scans of the chest abdomen and pelvis as well.  Clinically, he reports increase in his arm pain and a protrusion out of his right humerus.  He denies any constitutional symptoms of fatigue, tiredness or weakness.     Medications: Updated on review. Current Outpatient Medications  Medication Sig Dispense Refill  . amLODipine (NORVASC) 2.5 MG tablet TAKE 1 TABLET ONCE DAILY. 30 tablet 6  . aspirin EC 81 MG tablet Take 1 tablet (81 mg total) by mouth daily. Swallow whole. 30 tablet 11  . atorvastatin (LIPITOR) 80 MG tablet TAKE 1 TABLET ONCE DAILY AT 6 PM. 90 tablet 3  . Biotin 1000 MCG tablet Take 1,000 mcg by mouth once a week.    . metoprolol succinate (TOPROL-XL) 50 MG 24  hr tablet Take 1 tablet (50 mg total) by mouth at bedtime. 90 tablet 3  . Multiple Vitamins-Minerals (IMMUNE SUPPORT VITAMIN C PO) Take by mouth.    . nitroGLYCERIN (NITROSTAT) 0.4 MG SL tablet 1 TAB UNDER TONGUE AS NEEDED FOR CHEST PAIN. MAY REPEAT EVERY 5 MIN FOR A TOTAL OF 3 DOSES. 25 tablet 4   No current facility-administered medications for this visit.     Allergies:  Allergies  Allergen Reactions  . Other Itching and Other (See Comments)    Pet dander, seasonal allergies = Runny nose, itchy eyes      Physical Exam:   Blood pressure (!) 145/74, pulse 74, temperature 98 F (36.7 C), temperature source Tympanic, resp. rate 17, weight 203 lb 12.8 oz (92.4 kg), SpO2 96 %.    ECOG: 1     General appearance: Alert, awake without any distress. Head: Atraumatic without abnormalities Oropharynx: Without any thrush or ulcers. Eyes: No scleral icterus. Lymph nodes: No lymphadenopathy noted in the cervical, supraclavicular, or axillary nodes Heart:regular rate and rhythm, without any murmurs or gallops.   Lung: Clear to auscultation without any rhonchi, wheezes or dullness to percussion. Abdomin: Soft, nontender without any shifting dullness or ascites. Musculoskeletal: Protrusion noted of his right humerus.  Tender to touch without any erythema or induration. Neurological: No motor or sensory deficits. Skin: No rashes or lesions.          Lab Results: Lab Results  Component Value Date  WBC 6.2 08/30/2020   HGB 11.1 (L) 08/30/2020   HCT 32.3 (L) 08/30/2020   MCV 86.1 08/30/2020   PLT 187 08/30/2020     Chemistry      Component Value Date/Time   NA 138 08/30/2020 0930   NA 137 04/07/2019 1021   K 4.4 08/30/2020 0930   CL 105 08/30/2020 0930   CO2 25 08/30/2020 0930   BUN 24 (H) 08/30/2020 0930   BUN 29 (H) 04/07/2019 1021   CREATININE 1.56 (H) 08/30/2020 0930      Component Value Date/Time   CALCIUM 8.9 08/30/2020 0930   ALKPHOS 81 08/30/2020 0930    AST 22 08/30/2020 0930   ALT 12 08/30/2020 0930   BILITOT 0.6 08/30/2020 0930     IMPRESSION: 1. New/progressive metastatic disease to the right liver as detailed. Consider MRI abdomen without and with IV contrast for better characterization given limited visualization of these lesions on this noncontrast study. 2. No evidence of local tumor recurrence in the left nephrectomy bed. 3. No evidence of metastatic disease in the chest. 4. Chronic findings include: Stable dilated 4.0 cm ascending thoracic aorta. Left-sided IVC. Cholelithiasis. Aortic Atherosclerosis (ICD10-I70.0).  These results will be called to the ordering clinician or representative by the Radiologist Assistant, and communication documented in the PACS or [employer].    Impression and Plan:  75 year old man with:   1.    Left renal pelvis cancer diagnosed in May 2021.  He presented with T4N1 high-grade urothelial carcinoma and subsequently developed stage IV disease in May 2022.   His disease status was updated at this time including imaging studies of the humerus as well as the chest abdomen and pelvis.  He has very suspicious findings of metastatic disease especially in the humerus and highly suspicious hepatic involvement.  He will require systemic therapy and those options were reviewed at this time.  Single agent immunotherapy with Pembrolizumab, systemic chemotherapy versus antibiotic drug conjugate with Padcev were reviewed.  Given his poor response to chemotherapy he will be in favor of trying immunotherapy prior to trying antibody drug conjugate.  Complications associated with this treatment include nausea, fatigue, immune mediated complications among others.  Plan is to treat with Pembrolizumab 200 mg every 3 weeks which could be transition to every 6 weeks pending his tolerance.   After discussion today he is agreeable to proceed and we will update his staging scans after 3 months.  2. IV  access: Port-A-Cath currently in place and will be in use for future therapy.   3. Renal function surveillance:Creatinine clearance remained stable between 40 to 45 cc/min.   4.  Anemia: Related to his cancer and previous therapy.  Hemoglobin is around 11.  5.  Lytic lesion of the humerus: Evaluation by orthopedics is currently ongoing.  He will likely require surgical fixation.  Follow-up with radiation would be a possibility.  6.  Immune mediated complications.  I will continue to educate him about potential issues including pneumonitis, colitis and thyroid disease.  7. Follow-up: He will return in the next few weeks to start therapy.   30  minutes were spent on this visit. The time was spent on dedicated to reviewing imaging studies, disease status update and outlining future plan of care.     [name], MD 5/20/20228:37 AM

## 2020-09-11 DIAGNOSIS — C7951 Secondary malignant neoplasm of bone: Secondary | ICD-10-CM | POA: Diagnosis not present

## 2020-09-11 DIAGNOSIS — C652 Malignant neoplasm of left renal pelvis: Secondary | ICD-10-CM | POA: Diagnosis not present

## 2020-09-12 ENCOUNTER — Encounter: Payer: Self-pay | Admitting: Family Medicine

## 2020-09-12 ENCOUNTER — Encounter: Payer: Self-pay | Admitting: Oncology

## 2020-09-13 ENCOUNTER — Inpatient Hospital Stay: Payer: PPO

## 2020-09-13 ENCOUNTER — Other Ambulatory Visit: Payer: Self-pay

## 2020-09-13 ENCOUNTER — Telehealth: Payer: Self-pay | Admitting: Oncology

## 2020-09-13 VITALS — BP 140/83 | HR 64 | Temp 98.0°F | Resp 17

## 2020-09-13 DIAGNOSIS — C652 Malignant neoplasm of left renal pelvis: Secondary | ICD-10-CM

## 2020-09-13 DIAGNOSIS — Z5112 Encounter for antineoplastic immunotherapy: Secondary | ICD-10-CM | POA: Diagnosis not present

## 2020-09-13 DIAGNOSIS — C679 Malignant neoplasm of bladder, unspecified: Secondary | ICD-10-CM

## 2020-09-13 DIAGNOSIS — Z9189 Other specified personal risk factors, not elsewhere classified: Secondary | ICD-10-CM | POA: Diagnosis not present

## 2020-09-13 DIAGNOSIS — Z95828 Presence of other vascular implants and grafts: Secondary | ICD-10-CM

## 2020-09-13 DIAGNOSIS — C7951 Secondary malignant neoplasm of bone: Secondary | ICD-10-CM | POA: Diagnosis not present

## 2020-09-13 LAB — CMP (CANCER CENTER ONLY)
ALT: 16 U/L (ref 0–44)
AST: 29 U/L (ref 15–41)
Albumin: 3.7 g/dL (ref 3.5–5.0)
Alkaline Phosphatase: 84 U/L (ref 38–126)
Anion gap: 10 (ref 5–15)
BUN: 25 mg/dL — ABNORMAL HIGH (ref 8–23)
CO2: 24 mmol/L (ref 22–32)
Calcium: 9.2 mg/dL (ref 8.9–10.3)
Chloride: 106 mmol/L (ref 98–111)
Creatinine: 1.56 mg/dL — ABNORMAL HIGH (ref 0.61–1.24)
GFR, Estimated: 45 mL/min — ABNORMAL LOW (ref >60)
Glucose, Bld: 140 mg/dL — ABNORMAL HIGH (ref 70–99)
Potassium: 4 mmol/L (ref 3.5–5.1)
Sodium: 140 mmol/L (ref 135–145)
Total Bilirubin: 0.8 mg/dL (ref 0.3–1.2)
Total Protein: 7.3 g/dL (ref 6.5–8.1)

## 2020-09-13 LAB — CBC WITH DIFFERENTIAL (CANCER CENTER ONLY)
Abs Immature Granulocytes: 0.03 10*3/uL (ref 0.00–0.07)
Basophils Absolute: 0.1 10*3/uL (ref 0.0–0.1)
Basophils Relative: 1 %
Eosinophils Absolute: 0.2 10*3/uL (ref 0.0–0.5)
Eosinophils Relative: 3 %
HCT: 32.5 % — ABNORMAL LOW (ref 39.0–52.0)
Hemoglobin: 11 g/dL — ABNORMAL LOW (ref 13.0–17.0)
Immature Granulocytes: 0 %
Lymphocytes Relative: 18 %
Lymphs Abs: 1.2 10*3/uL (ref 0.7–4.0)
MCH: 29.1 pg (ref 26.0–34.0)
MCHC: 33.8 g/dL (ref 30.0–36.0)
MCV: 86 fL (ref 80.0–100.0)
Monocytes Absolute: 0.5 10*3/uL (ref 0.1–1.0)
Monocytes Relative: 8 %
Neutro Abs: 4.8 10*3/uL (ref 1.7–7.7)
Neutrophils Relative %: 70 %
Platelet Count: 220 10*3/uL (ref 150–400)
RBC: 3.78 MIL/uL — ABNORMAL LOW (ref 4.22–5.81)
RDW: 13.4 % (ref 11.5–15.5)
WBC Count: 6.9 10*3/uL (ref 4.0–10.5)
nRBC: 0 % (ref 0.0–0.2)

## 2020-09-13 LAB — TSH: TSH: 3.061 u[IU]/mL (ref 0.320–4.118)

## 2020-09-13 MED ORDER — SODIUM CHLORIDE 0.9% FLUSH
10.0000 mL | Freq: Once | INTRAVENOUS | Status: AC
Start: 2020-09-13 — End: 2020-09-13
  Administered 2020-09-13: 10 mL
  Filled 2020-09-13: qty 10

## 2020-09-13 MED ORDER — SODIUM CHLORIDE 0.9 % IV SOLN
Freq: Once | INTRAVENOUS | Status: AC
  Filled 2020-09-13: qty 250

## 2020-09-13 MED ORDER — SODIUM CHLORIDE 0.9 % IV SOLN
200.0000 mg | Freq: Once | INTRAVENOUS | Status: AC
  Administered 2020-09-13: 200 mg via INTRAVENOUS
  Filled 2020-09-13: qty 8

## 2020-09-13 NOTE — Telephone Encounter (Signed)
Scheduled appts per 5/26 sch msg. Called pt, no answer. Left msg with appts date and time. Also let pt know that appt is a phone visit.

## 2020-09-13 NOTE — Progress Notes (Signed)
Ok to proceed with treatment today per Dr. Alen Blew with elevated Scr.

## 2020-09-13 NOTE — Patient Instructions (Signed)
[facility]ICAL [facility]COLOGY  Discharge Instructions: ?Thank you for choosing [facility] to provide your oncology and hematology care.  ? ?If you have a lab appointment with the Cancer Center, please go directly to the Cancer Center and check in at the registration area. ?  ?Wear comfortable clothing and clothing appropriate for easy access to any Portacath or PICC line.  ? ?We strive to give you quality time with your provider. You may need to reschedule your appointment if you arrive late (15 or more minutes).  Arriving late affects you and other patients whose appointments are after yours.  Also, if you miss three or more appointments without notifying the office, you may be dismissed from the clinic at the provider?s discretion.    ?  ?For prescription refill requests, have your pharmacy contact our office and allow 72 hours for refills to be completed.   ? ?Today you received the following chemotherapy and/or immunotherapy agents: Keytruda ?  ?To help prevent nausea and vomiting after your treatment, we encourage you to take your nausea medication as directed. ? ?BELOW ARE SYMPTOMS THAT SHOULD BE REPORTED IMMEDIATELY: ?*FEVER GREATER THAN 100.4 F (38 ?C) OR HIGHER ?*CHILLS OR SWEATING ?*NAUSEA AND VOMITING THAT IS NOT CONTROLLED WITH YOUR NAUSEA MEDICATION ?*UNUSUAL SHORTNESS OF BREATH ?*UNUSUAL BRUISING OR BLEEDING ?*URINARY PROBLEMS (pain or burning when urinating, or frequent urination) ?*BOWEL PROBLEMS (unusual diarrhea, constipation, pain near the anus) ?TENDERNESS IN MOUTH AND THROAT WITH OR WITHOUT PRESENCE OF ULCERS (sore throat, sores in mouth, or a toothache) ?UNUSUAL RASH, SWELLING OR PAIN  ?UNUSUAL VAGINAL DISCHARGE OR ITCHING  ? ?Items with * indicate a potential emergency and should be followed up as soon as possible or go to the Emergency Department if any problems should occur. ? ?Please show the CHEMOTHERAPY ALERT CARD or IMMUNOTHERAPY ALERT CARD at check-in to the  Emergency Department and triage nurse. ? ?Should you have questions after your visit or need to cancel or reschedule your appointment, please contact [facility]  Dept: [phone]  and follow the prompts.  Office hours are 8:00 a.m. to 4:30 p.m. Monday - Friday. Please note that voicemails left after 4:00 p.m. may not be returned until the following business day.  We are closed weekends and major holidays. You have access to a nurse at all times for urgent questions. Please call the main number to the clinic Dept: [phone] and follow the prompts. ? ? ?For any non-urgent questions, you may also contact your provider using MyChart. We now offer e-Visits for anyone 18 and older to request care online for non-urgent symptoms. For details visit [email or web address]. ?  ?Also download the MyChart app! Go to the app store, search "MyChart", open the app, select [facility], and log in with your MyChart username and password. ? ?Due to Covid, a mask is required upon entering the hospital/clinic. If you do not have a mask, one will be given to you upon arrival. For doctor visits, patients may have 1 support person aged 18 or older with them. For treatment visits, patients cannot have anyone with them due to current Covid guidelines and our immunocompromised population.  ? ?

## 2020-09-14 ENCOUNTER — Encounter: Payer: Self-pay | Admitting: Family Medicine

## 2020-09-14 ENCOUNTER — Encounter: Payer: Self-pay | Admitting: Oncology

## 2020-09-15 ENCOUNTER — Encounter: Payer: Self-pay | Admitting: Family Medicine

## 2020-09-17 ENCOUNTER — Telehealth: Payer: Self-pay | Admitting: *Deleted

## 2020-09-17 ENCOUNTER — Encounter: Payer: Self-pay | Admitting: Oncology

## 2020-09-17 ENCOUNTER — Ambulatory Visit: Payer: PPO | Admitting: Oncology

## 2020-09-17 NOTE — Telephone Encounter (Signed)
-----   Message from Tildon Husky, RN sent at 09/13/2020 10:16 AM EDT ----- Regarding: first time treatment call Pt received [place] for the first time today. He is seen by Dr. Alen Blew. He tolerated treatment well :)

## 2020-09-17 NOTE — Telephone Encounter (Signed)
Called pt to see how he did with his recent treatment.  He reports doing well & no side effects from immunotherapy.  His treatments are on hold for surgery & other that some pain which he is managing with tylenol & lidocaine, he is doing OK. Reminded of f/u appt with Dr Alen Blew & suggested that if he is not able to make that appt to call.  He expressed understanding & appreciation for call.

## 2020-09-19 ENCOUNTER — Encounter: Payer: Self-pay | Admitting: Family Medicine

## 2020-09-19 DIAGNOSIS — C7951 Secondary malignant neoplasm of bone: Secondary | ICD-10-CM | POA: Diagnosis not present

## 2020-09-25 DIAGNOSIS — R2231 Localized swelling, mass and lump, right upper limb: Secondary | ICD-10-CM | POA: Diagnosis not present

## 2020-09-25 DIAGNOSIS — M899 Disorder of bone, unspecified: Secondary | ICD-10-CM | POA: Diagnosis not present

## 2020-09-27 IMAGING — DX DG CERVICAL SPINE COMPLETE 4+V
5 series · 5 of 5 positions shown · non-contrast
Comparison: None.

CLINICAL DATA: Neck pain and shoulder pain 4 months

EXAM:
CERVICAL SPINE - COMPLETE 4+ VIEW

[c-spine lat]
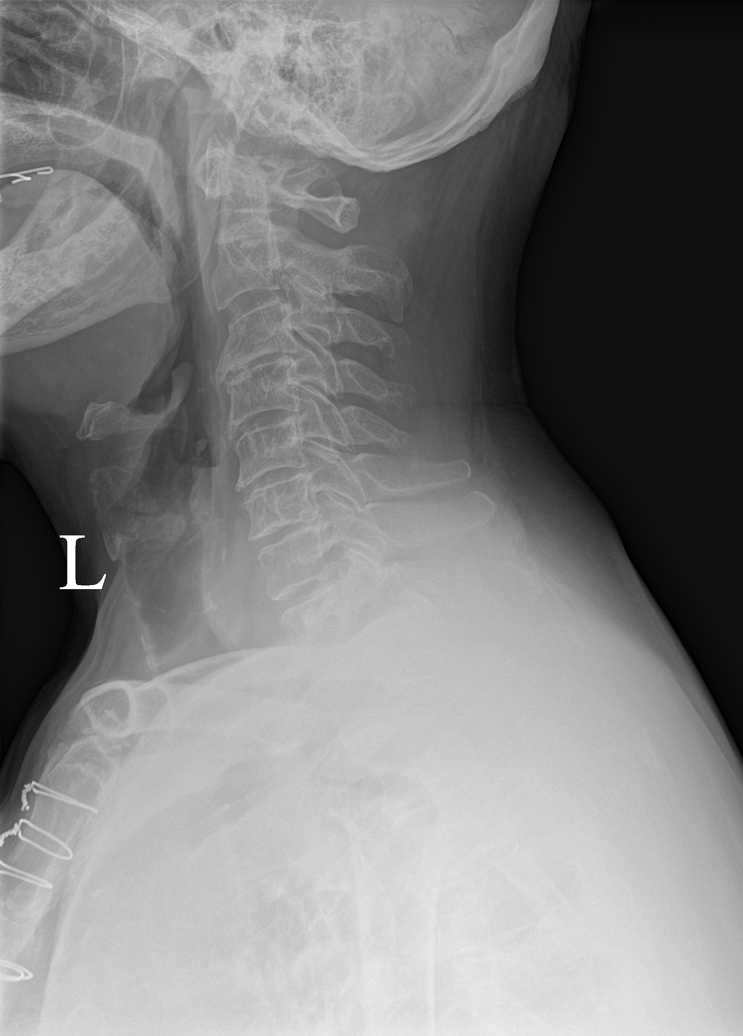

[c-spine obl (1 of 2)]
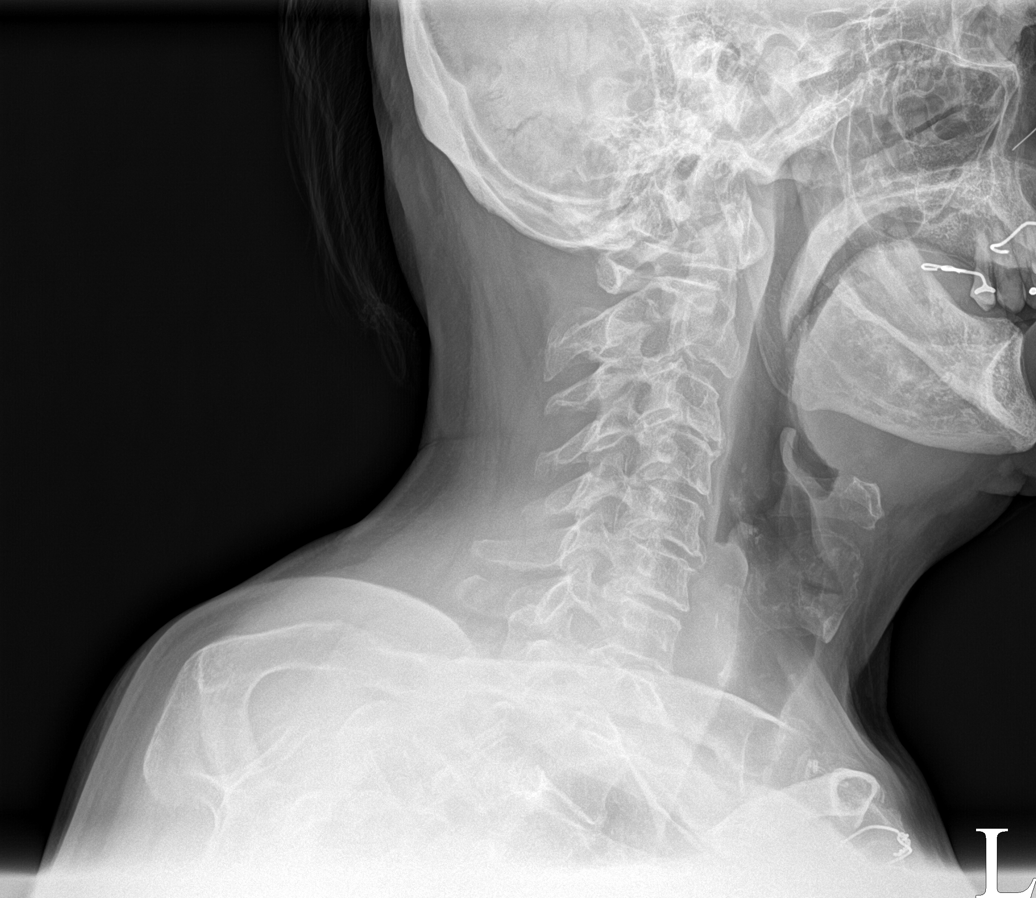

[c-spine obl (2 of 2)]
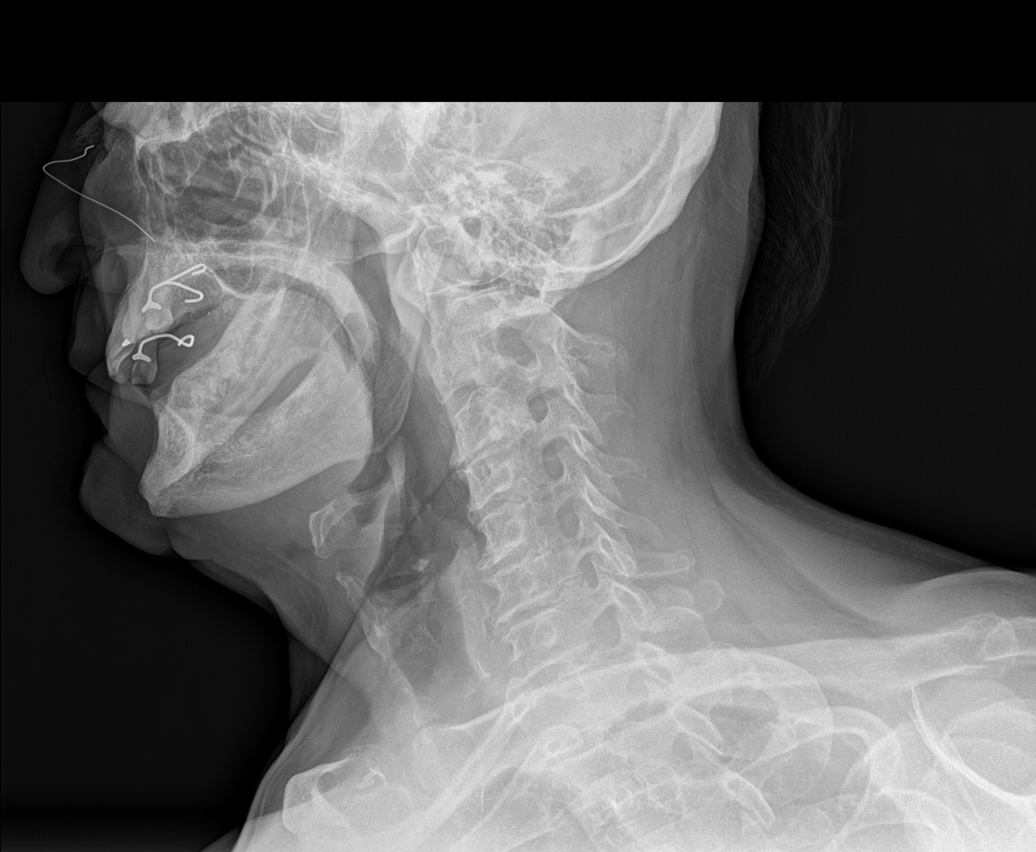

[c-spine ap]
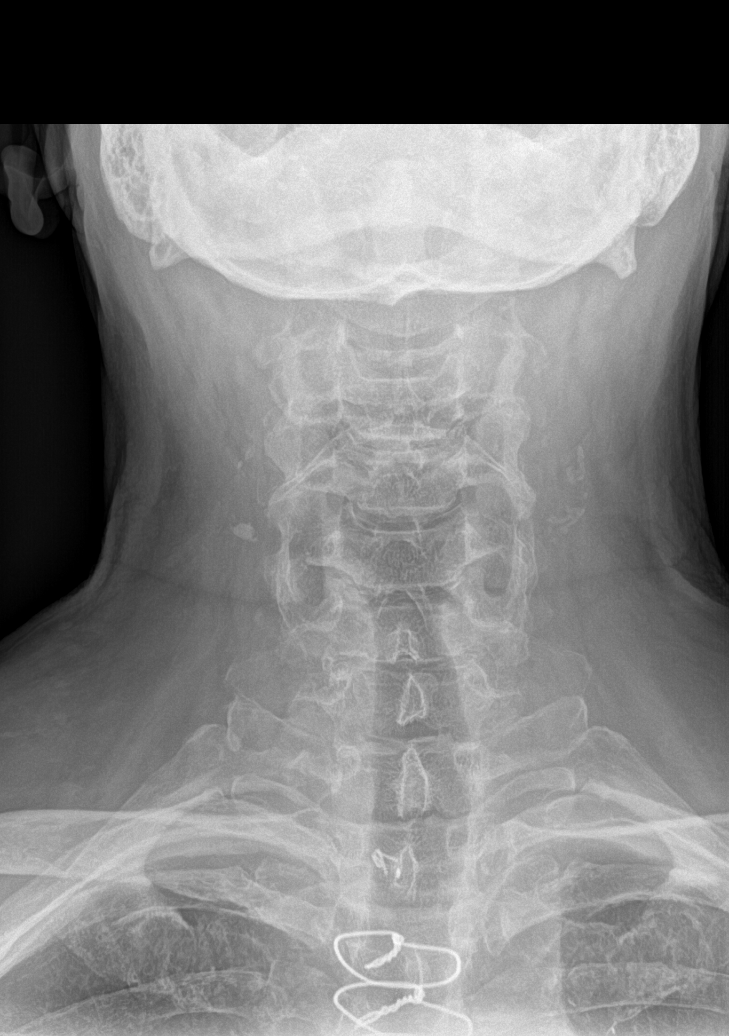

[c-spine open mouth]
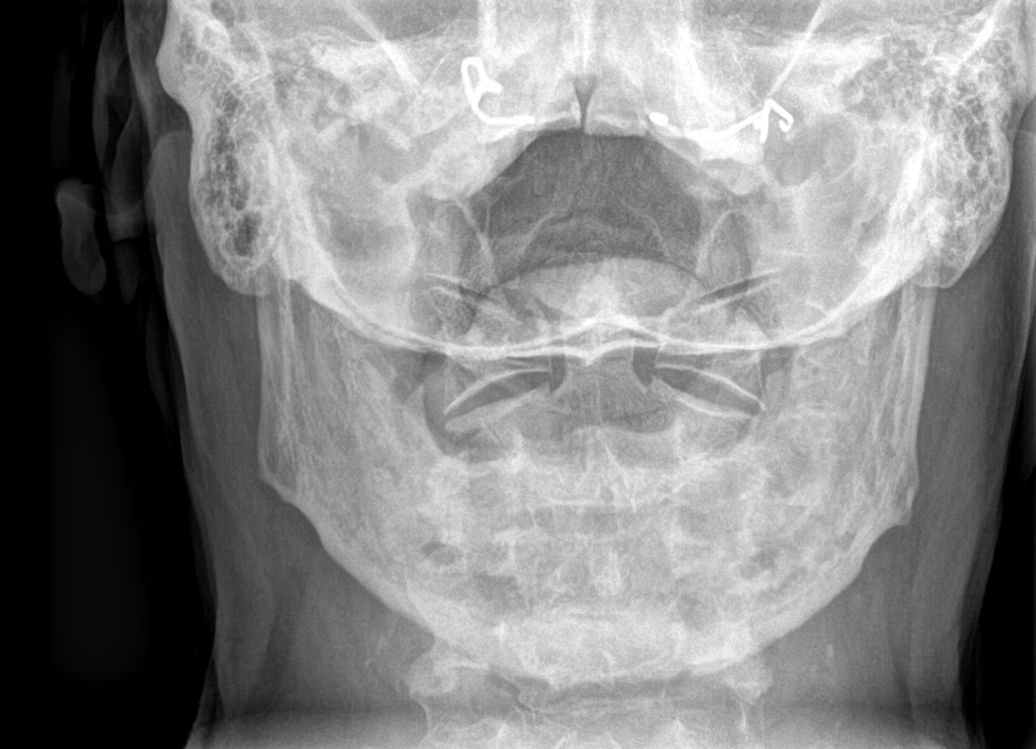

[5 of 5 positions shown; findings below may reference images not displayed]

FINDINGS: Straightening of the cervical lordosis without static listhesis. No
acute fractures seen. Intervertebral disc height loss and
degenerative endplate spurring is most prevalent at C3-4. Oblique
views reveal bony foraminal narrowing most prevalent on the right at
C3-4, C4-5, and C6-7. Left-sided neural foramina appear patent.
Prevertebral soft tissues are within normal limits. Bilateral
carotid calcifications.
IMPRESSION: 1. Degenerative disc disease most prevalent at C3-4.
2. Bony foraminal narrowing on the right at C3-4, C4-5, and C6-7.

## 2020-09-30 ENCOUNTER — Encounter: Payer: Self-pay | Admitting: Family Medicine

## 2020-10-01 ENCOUNTER — Telehealth: Payer: Self-pay | Admitting: Cardiovascular Disease

## 2020-10-01 ENCOUNTER — Telehealth: Payer: Self-pay | Admitting: Family Medicine

## 2020-10-01 DIAGNOSIS — Z905 Acquired absence of kidney: Secondary | ICD-10-CM | POA: Diagnosis not present

## 2020-10-01 DIAGNOSIS — M84421A Pathological fracture, right humerus, initial encounter for fracture: Secondary | ICD-10-CM | POA: Diagnosis not present

## 2020-10-01 DIAGNOSIS — Z7982 Long term (current) use of aspirin: Secondary | ICD-10-CM | POA: Diagnosis not present

## 2020-10-01 DIAGNOSIS — I2510 Atherosclerotic heart disease of native coronary artery without angina pectoris: Secondary | ICD-10-CM | POA: Diagnosis not present

## 2020-10-01 DIAGNOSIS — Z951 Presence of aortocoronary bypass graft: Secondary | ICD-10-CM | POA: Diagnosis not present

## 2020-10-01 DIAGNOSIS — C7951 Secondary malignant neoplasm of bone: Secondary | ICD-10-CM | POA: Diagnosis not present

## 2020-10-01 DIAGNOSIS — C7989 Secondary malignant neoplasm of other specified sites: Secondary | ICD-10-CM | POA: Diagnosis not present

## 2020-10-01 DIAGNOSIS — Z9889 Other specified postprocedural states: Secondary | ICD-10-CM | POA: Diagnosis not present

## 2020-10-01 DIAGNOSIS — R011 Cardiac murmur, unspecified: Secondary | ICD-10-CM | POA: Diagnosis not present

## 2020-10-01 DIAGNOSIS — Z952 Presence of prosthetic heart valve: Secondary | ICD-10-CM | POA: Diagnosis not present

## 2020-10-01 DIAGNOSIS — I350 Nonrheumatic aortic (valve) stenosis: Secondary | ICD-10-CM | POA: Diagnosis not present

## 2020-10-01 DIAGNOSIS — M84521A Pathological fracture in neoplastic disease, right humerus, initial encounter for fracture: Secondary | ICD-10-CM | POA: Diagnosis not present

## 2020-10-01 DIAGNOSIS — D487 Neoplasm of uncertain behavior of other specified sites: Secondary | ICD-10-CM | POA: Diagnosis not present

## 2020-10-01 DIAGNOSIS — I272 Pulmonary hypertension, unspecified: Secondary | ICD-10-CM | POA: Diagnosis not present

## 2020-10-01 DIAGNOSIS — Z79899 Other long term (current) drug therapy: Secondary | ICD-10-CM | POA: Diagnosis not present

## 2020-10-01 DIAGNOSIS — D649 Anemia, unspecified: Secondary | ICD-10-CM | POA: Diagnosis not present

## 2020-10-01 DIAGNOSIS — I252 Old myocardial infarction: Secondary | ICD-10-CM | POA: Diagnosis not present

## 2020-10-01 DIAGNOSIS — Z955 Presence of coronary angioplasty implant and graft: Secondary | ICD-10-CM | POA: Diagnosis not present

## 2020-10-01 DIAGNOSIS — C4001 Malignant neoplasm of scapula and long bones of right upper limb: Secondary | ICD-10-CM | POA: Diagnosis not present

## 2020-10-01 DIAGNOSIS — I10 Essential (primary) hypertension: Secondary | ICD-10-CM | POA: Diagnosis not present

## 2020-10-01 DIAGNOSIS — Z8554 Personal history of malignant neoplasm of ureter: Secondary | ICD-10-CM | POA: Diagnosis not present

## 2020-10-01 DIAGNOSIS — C649 Malignant neoplasm of unspecified kidney, except renal pelvis: Secondary | ICD-10-CM | POA: Diagnosis not present

## 2020-10-01 NOTE — Telephone Encounter (Signed)
Patient is calling and stated that he is in the hospital and has to have surgery on his right arm and he was advised to stop taking metoprolol succinate for 24 hours because beta blocker. Pt wanted to see what provider recommends, please advise. CB is [phone]

## 2020-10-01 NOTE — Telephone Encounter (Addendum)
Spoke with patient regarding surgery tomorrow. He was advised from surgeon to hold metoprolol succinate and ASA. He was concerned b/c Dr. Loletha Grayer previous told him in a message in reference to angiogram he should take metoprolol. Explained that he should follow surgeons recommendations. Also reached out to pre-op NP to review further  Sharee Pimple NP reviewed - saw no reason why he could NOT hold beta-blocker but advised to follow up with cardiologist covering for Dr. Loletha Grayer  _______  Called patient at 5:37 pm on 10/01/20 and advised him it was ok to hold (not take) the metoprolol tonight in anticipation of his surgery tomorrow. He thanked me for clarifying this for him.  Dr.H

## 2020-10-01 NOTE — Telephone Encounter (Signed)
Pt c/o medication issue:  1. Name of Medication:  metoprolol succinate (TOPROL-XL) 50 MG 24 hr tablet aspirin EC 81 MG tablet  2. How are you currently taking this medication (dosage and times per day)? As directed   3. Are you having a reaction (difficulty breathing--STAT)? No  4. What is your medication issue? Patient wanted Dr. Lurline Del Advice as to whether or not he should hold this medication for tonight. He is at the [facility] center waiting to have surgery. He was told that he should take this medication every day "no matter what" by Dr. Loletha Grayer.  The hospital staff is telling him to hold the medication prior to his surgery. The patient just wants to do what Dr. Loletha Grayer recommends

## 2020-10-02 NOTE — Telephone Encounter (Signed)
Looks like cardiology is also responding/check with provider, so would await their final say, but I would say ok to hold if surgeon is requesting. Previously it was recommended to take when he had angiogram, but this was different setting with need for more intense heart rate control. Wishing all the best with surgery!

## 2020-10-03 NOTE — Telephone Encounter (Signed)
Patient informed of the message below.  Patient stated he has had the surgery and is recovering now.

## 2020-10-04 ENCOUNTER — Other Ambulatory Visit: Payer: PPO

## 2020-10-04 ENCOUNTER — Ambulatory Visit: Payer: PPO | Admitting: Oncology

## 2020-10-04 ENCOUNTER — Ambulatory Visit: Payer: PPO

## 2020-10-07 ENCOUNTER — Encounter: Payer: Self-pay | Admitting: Family Medicine

## 2020-10-07 ENCOUNTER — Encounter: Payer: Self-pay | Admitting: Oncology

## 2020-10-08 ENCOUNTER — Other Ambulatory Visit: Payer: Self-pay

## 2020-10-08 ENCOUNTER — Telehealth: Payer: Self-pay

## 2020-10-08 ENCOUNTER — Inpatient Hospital Stay: Payer: PPO | Attending: Oncology

## 2020-10-08 DIAGNOSIS — Z95828 Presence of other vascular implants and grafts: Secondary | ICD-10-CM

## 2020-10-08 DIAGNOSIS — C652 Malignant neoplasm of left renal pelvis: Secondary | ICD-10-CM | POA: Insufficient documentation

## 2020-10-08 DIAGNOSIS — Z452 Encounter for adjustment and management of vascular access device: Secondary | ICD-10-CM | POA: Insufficient documentation

## 2020-10-08 MED ORDER — SODIUM CHLORIDE 0.9% FLUSH
10.0000 mL | Freq: Once | INTRAVENOUS | Status: AC
  Administered 2020-10-08: 10 mL
  Filled 2020-10-08: qty 10

## 2020-10-08 MED ORDER — HEPARIN SOD (PORK) LOCK FLUSH 100 UNIT/ML IV SOLN
500.0000 [IU] | Freq: Once | INTRAVENOUS | Status: AC
  Administered 2020-10-08: 500 [IU]
  Filled 2020-10-08: qty 5

## 2020-10-08 NOTE — Telephone Encounter (Signed)
Pt sent Mychart message with concern for port as his port is still accessed after d/c home from surgery 10/04/2020. This LPN called pt and he states, "I have two used IV lines hanging from my chest. There is a small bandage covering the IV site in my chest but the rest is exposed." This LPN offered pt a port flush appt and he will come in today at 1200.

## 2020-10-09 NOTE — Telephone Encounter (Signed)
Patient would like to come by next week to be fitted and get another sling ( lost during hospitalization). Can he do this with you or Kana, or does he need an appt with Tamala Julian.  He is currently using [phone], ok to leave msg.

## 2020-10-09 NOTE — Telephone Encounter (Signed)
Patient will come in on Monday at 11:00am to be fitted for sling.

## 2020-10-18 ENCOUNTER — Encounter: Payer: Self-pay | Admitting: Family Medicine

## 2020-10-18 ENCOUNTER — Encounter: Payer: Self-pay | Admitting: Oncology

## 2020-10-22 DIAGNOSIS — Z4789 Encounter for other orthopedic aftercare: Secondary | ICD-10-CM | POA: Diagnosis not present

## 2020-10-22 DIAGNOSIS — Z9889 Other specified postprocedural states: Secondary | ICD-10-CM | POA: Diagnosis not present

## 2020-10-25 ENCOUNTER — Other Ambulatory Visit: Payer: PPO

## 2020-10-25 ENCOUNTER — Inpatient Hospital Stay: Payer: PPO | Attending: Oncology | Admitting: Oncology

## 2020-10-25 ENCOUNTER — Other Ambulatory Visit: Payer: Self-pay

## 2020-10-25 VITALS — BP 116/70 | HR 86 | Temp 98.1°F | Resp 18 | Wt 189.4 lb

## 2020-10-25 DIAGNOSIS — C652 Malignant neoplasm of left renal pelvis: Secondary | ICD-10-CM | POA: Insufficient documentation

## 2020-10-25 DIAGNOSIS — C787 Secondary malignant neoplasm of liver and intrahepatic bile duct: Secondary | ICD-10-CM | POA: Diagnosis not present

## 2020-10-25 DIAGNOSIS — Z7982 Long term (current) use of aspirin: Secondary | ICD-10-CM | POA: Diagnosis not present

## 2020-10-25 DIAGNOSIS — D649 Anemia, unspecified: Secondary | ICD-10-CM | POA: Diagnosis not present

## 2020-10-25 DIAGNOSIS — C7951 Secondary malignant neoplasm of bone: Secondary | ICD-10-CM | POA: Diagnosis not present

## 2020-10-25 DIAGNOSIS — Z79899 Other long term (current) drug therapy: Secondary | ICD-10-CM | POA: Diagnosis not present

## 2020-10-25 DIAGNOSIS — Z5112 Encounter for antineoplastic immunotherapy: Secondary | ICD-10-CM | POA: Diagnosis not present

## 2020-10-25 NOTE — Progress Notes (Signed)
Hematology and Oncology Follow Up Visit  Lawrence Hall [id number] 09-28-1945 75 y.o. 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  He has bone and potentially hepatic involvement.    Prior Therapy: He is status post biopsy obtained on Aug 21, 2019 which showed high-grade urothelial carcinoma of the left renal pelvis.   He status post neoadjuvant chemotherapy utilizing gemcitabine and cisplatin given on day 1 and cisplatin given on day 8 start her on 10/06/2019.   He completed 3 cycles of therapy in August 2021.  He is status post left robotic assisted laparoscopic nephroureterectomy and retroperitoneal lymphadenectomy completed on 01/04/2020.  He was found to have a residual T4 disease with 1 out of 5 lymph nodes involved.  He is status post tumor resection from his right humerus with surgical fixation and rodding completed by Dr. Lovena Hall at [facility] completed in June 2022.  The final pathology confirmed the presence of metastatic urothelial carcinoma.  Current therapy: Pembrolizumab 200 mg started on Sep 13, 2020.  He will receive 100 mg starting with the next cycle of therapy.   Interim History: Lawrence Hall returns today for a follow-up visit.  Since the last visit, he underwent right humeral resection of his tumor with surgical fixation and riding completed in June 2022.  He recovered reasonably well without any major complications.  He denies any nausea, vomiting or abdominal pain.  He denies any arm pain or neuropathy.  He is currently undergoing exercises at home.     Medications: Reviewed without any changes. Current Outpatient Medications  Medication Sig Dispense Refill   acetaminophen (TYLENOL) 500 MG tablet Take 500 mg by mouth every 6 (six) hours as needed for moderate pain.     amLODipine (NORVASC) 2.5 MG tablet TAKE 1 TABLET ONCE  DAILY. 30 tablet 6   aspirin EC 81 MG tablet Take 1 tablet (81 mg total) by mouth daily. Swallow whole. 30 tablet 11   atorvastatin (LIPITOR) 80 MG tablet TAKE 1 TABLET ONCE DAILY AT 6 PM. 90 tablet 3   Biotin 1000 MCG tablet Take 1,000 mcg by mouth once a week.     metoprolol succinate (TOPROL-XL) 50 MG 24 hr tablet Take 1 tablet (50 mg total) by mouth at bedtime. 90 tablet 3   Multiple Vitamins-Minerals (IMMUNE SUPPORT VITAMIN C PO) Take by mouth.     nitroGLYCERIN (NITROSTAT) 0.4 MG SL tablet 1 TAB UNDER TONGUE AS NEEDED FOR CHEST PAIN. MAY REPEAT EVERY 5 MIN FOR A TOTAL OF 3 DOSES. 25 tablet 4   No current facility-administered medications for this visit.     Allergies:  Allergies  Allergen Reactions   Other Itching and Other (See Comments)    Pet dander, seasonal allergies = Runny nose, itchy eyes      Physical Exam:   Blood pressure 116/70, pulse 86, temperature 98.1 F (36.7 C), temperature source Oral, resp. rate 18, weight 189 lb 6 oz (85.9 kg), SpO2 97 %.    ECOG: 1    General appearance: Comfortable appearing without any discomfort Head: Normocephalic without any trauma Oropharynx: Mucous membranes are moist and pink without any thrush or ulcers. Eyes: Pupils are equal and round reactive to light. Lymph nodes: No cervical, supraclavicular, inguinal or axillary lymphadenopathy.   Heart:regular rate and rhythm.  S1 and S2 without leg edema. Lung: Clear without any rhonchi or wheezes.  No dullness to percussion. Abdomin: Soft, nontender, nondistended with good bowel sounds.  No hepatosplenomegaly. Musculoskeletal: No joint deformity or effusion.  Full range of motion noted. Neurological: No deficits noted on motor, sensory and deep tendon reflex exam. Skin: No petechial rash or dryness.  Appeared moist.            Lab Results: Lab Results  Component Value Date   WBC 6.9 09/13/2020   HGB 11.0 (L) 09/13/2020   HCT 32.5 (L) 09/13/2020   MCV 86.0  09/13/2020   PLT 220 09/13/2020     Chemistry      Component Value Date/Time   NA 140 09/13/2020 0742   NA 137 04/07/2019 1021   K 4.0 09/13/2020 0742   CL 106 09/13/2020 0742   CO2 24 09/13/2020 0742   BUN 25 (H) 09/13/2020 0742   BUN 29 (H) 04/07/2019 1021   CREATININE 1.56 (H) 09/13/2020 0742      Component Value Date/Time   CALCIUM 9.2 09/13/2020 0742   ALKPHOS 84 09/13/2020 0742   AST 29 09/13/2020 0742   ALT 16 09/13/2020 0742   BILITOT 0.8 09/13/2020 0742         Impression and Plan:  75 year old man with:     1.   Stage IV high-grade urothelial carcinoma of the renal pelvis with bone and hepatic involvement.     He received the first cycle of Pembrolizumab on Sep 13, 2020 and therapy interrupted because of his orthopedic surgery.  Risks and benefits of resuming Pembrolizumab were discussed at this time.  We will proceed with 400 mg every 6 weeks for ease of scheduling.  Complications were reiterated including nausea, fatigue immune mediated complications among others.     2.  IV access: Port-A-Cath will continue to be in use at this time.    3.  Renal function surveillance: His kidney function remained stable and will continue to monitor on therapy.    4.  Anemia: His hemoglobin is close to baseline of 11 without any issues at this time.  5.  Lytic lesion of the humerus: He is status post surgical fixation and rodding.  I will refer him to radiation oncology for evaluation and postoperative radiation.  He is interested in having that done locally.  6.  Immune mediated complications.  I continue to educate him about these complication clinic pneumonitis, colitis and thyroid disease.  7.  Follow-up: We will be in the near future to receive Pembrolizumab.  He will have repeat evaluation in 6 weeks for the next cycle of therapy.     30  minutes were dedicated to this encounter.  The time was spent on reviewing imaging studies, pathology results, treatment  choices and outlining future plan of care review.       Lawrence Button, MD 7/8/202211:25 AM

## 2020-10-27 ENCOUNTER — Encounter: Payer: Self-pay | Admitting: Oncology

## 2020-10-29 ENCOUNTER — Telehealth: Payer: Self-pay | Admitting: Radiation Oncology

## 2020-10-29 NOTE — Telephone Encounter (Signed)
Called patient to schedule appointment with Dr. Tammi Klippel. No answer, LVM for return call.

## 2020-10-30 DIAGNOSIS — C7951 Secondary malignant neoplasm of bone: Secondary | ICD-10-CM | POA: Insufficient documentation

## 2020-10-30 NOTE — Progress Notes (Signed)
Histology and Location of Primary Cancer:  Stage IV high-grade urothelial carcinoma of the renal pelvis with bone and hepatic involvement  Sites of Visceral and Bony Metastatic Disease:  CT C/A/P w/o Contrast 09/03/2020 --IMPRESSION: 1. New/progressive metastatic disease to the right liver as detailed. Consider MRI abdomen without and with IV contrast for better characterization given limited visualization of these lesions on this noncontrast study. 2. No evidence of local tumor recurrence in the left nephrectomy bed. 3. No evidence of metastatic disease in the chest. 4. Chronic findings include: Stable dilated 4.0 cm ascending thoracic aorta. Left-sided IVC. Cholelithiasis. Aortic Atherosclerosis (ICD10-I70.0).  MRI Right Humerus w/o Contrast 08/27/2020 --IMPRESSION: 1. Again demonstrated is a large lytic lesion involving the right humeral shaft with a significant exophytic soft tissue component. This finding remains most consistent with a metastasis and places the patient at risk for pathologic fracture. 2. Surrounding soft tissue edema without focal fluid collection.  CT Right Humerus w/o Contrast 08/27/2020 --IMPRESSION: Large metastatic deposit in the diaphysis of the humerus as described above. No other abnormality is identified.  Location(s) of Symptomatic Metastases:  Right humerus  Past/Anticipated chemotherapy by medical oncology, if any:  Under care of Dr. [name] 10/25/2020 Stage IV high-grade urothelial carcinoma of the renal pelvis with bone and hepatic involvement He received the first cycle of Pembrolizumab on Sep 13, 2020 and therapy interrupted because of his orthopedic surgery Risks and benefits of resuming Pembrolizumab were discussed at this time.  We will proceed with 400 mg every 6 weeks for ease of scheduling. Lytic lesion of the humerus: He is status post surgical fixation and rodding.   I will refer him to radiation oncology for evaluation and  postoperative radiation.  He is interested in having that done locally Follow-up: We will be in the near future to receive Pembrolizumab.  He will have repeat evaluation in 6 weeks for the next cycle of therapy  Pain on a scale of 0-10 is: Patient denies   Ambulatory status? Walker? Wheelchair?: Patient is ambulatory without assistive device   SAFETY ISSUES: Prior radiation? No Pacemaker/ICD? No Possible current pregnancy? N/A Is the patient on methotrexate? No  Current Complaints / other details:   Schedule for final post-op appointment with Dr. Lovena Le on 11/22/2020  10/02/2020 Dr. Gwyndolyn Saxon Ward ([facility]) RIGHT ARM AND HUMERUS RADICAL RESECTION OF CANCER WITH ADJUVANT TREATMENT WITH INTRAMEDULARY NAIL PLACEMENT AND CEMENTATION.   Patient has received the first 3 Pfizer vaccines as well as Moderna booster.

## 2020-10-30 NOTE — Progress Notes (Signed)
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    ICD-10-CM   1. Malignant tumor of renal pelvis, left (HCC)  C65.2     2. Metastasis to bone [facility])  C79.51       HISTORY OF PRESENT ILLNESS: Lawrence Hall is a 75 y.o. male seen at the request of Lawrence Hall.  He has a history of Gleason 3+4 adenocarcinoma the prostate diagnosed in December 2012 with a PSA of 8.36 at that time.  We met with the patient on April 23, 2011 to discuss treatment options but the patient ultimately elected to proceed with RALP which was performed by Dr. Alinda Hall on 08/10/2011.  Final surgical pathology confirmed PT2CN0, Gleason 3+4 disease with negative margins.  His PSA has remained undetectable since the time of surgery.  He developed gross hematuria in March 2021 and was found to have a 6 cm infiltrative left upper pole renal mass concerning for renal cell carcinoma versus urothelial carcinoma on CT A/P for work-up.  A biopsy performed on 08/21/2019 confirmed high-grade urothelial carcinoma.  Due to the large size of the lesion as well as associated small left hilar lymph node, he proceeded with neoadjuvant chemotherapy prior to surgical resection.  He completed 3 of 4 planned cycles of gemcitabine/cisplatin chemotherapy in August 2021 under the care of Lawrence Hall.  He subsequently proceeded with a left robot-assisted laparoscopic nephro ureterectomy and retroperitoneal lymphadenectomy on 01/04/2020 under the care of Dr. Alinda Hall.  Final pathology confirmed T4N1 disease and he continued to be  followed closely in active surveillance.  A restaging scan on 09/03/2020 showed new progressive disease in the right liver.    Due to some progressive right arm/shoulder pain, an x-ray was performed and he was found to have an 8 to 9 cm lytic, permeative lesion in the lateral aspect of the right humeral diaphysis with cortical disruption, worrisome for a metastatic lesion.  This was confirmed on CT and MRI scans performed 08/27/2020.        He was started on Keytruda immunotherapy on 09/13/2020 and underwent resection of the tumor from the right humerus with surgical fixation and rodding by Dr. Gwyndolyn Saxon Hall at [facility] on 10/02/2020.  Final pathology from this procedure confirmed metastatic urothelial carcinoma with multifocal positive margins.  He has been referred to [place] today to discuss the potential role of postoperative radiotherapy in the management of his disease.  PREVIOUS RADIATION THERAPY: No  PAST MEDICAL HISTORY:  Past Medical History:  Diagnosis Date   Arthritis    mild hands   Asthma    pt denies 4/21    Chest pain 07/25/2012   Coronary artery disease    Dysrhythmia    PAC'S, hx of atrial fib after OHS 04/2018   ED (erectile dysfunction)    H/O hiatal hernia    Heart murmur    Hypercholesterolemia    Hypertension    MARGINALLY HIGH - NO MEDS -MONITORS   Metastatic cancer to bone ([place]) dx'd 07/2020   rt humerus   Myocardial infarction ([place])    2017  Pneumonia    hx of 2018    PONV (postoperative nausea and vomiting)    Prostate cancer ([place]) 03/27/2011   prostate bx=adenocarcinoma,   Skin cancer 2008   basal cell face /removed   Sleep apnea    borderline sleep apnea per patient no devices    Urothelial cancer ([place]) dx'd 05/2019   left renal ca      PAST SURGICAL HISTORY: Past Surgical History:  Procedure Laterality Date   AORTIC VALVE REPLACEMENT N/A 04/29/2018   Procedure: AORTIC VALVE REPLACEMENT (AVR) using INSPIRIS RESILIA  AORTIC VALVE 60m;  Surgeon: Lawrence Hall;  [place] [facility]  Service: Open Heart Surgery;  Laterality: N/A;   basal cell cancer  2008   inside nose   CARDIAC CATHETERIZATION N/A 06/03/2015   Procedure: Left Heart Cath and Coronary Angiography;  Surgeon: TTroy Sine Hall;  [facility];  Service: Cardiovascular;  Laterality: N/A;   CATARACT EXTRACTION, BILATERAL  2007   CORONARY ARTERY BYPASS GRAFT N/A 04/29/2018   Procedure: CORONARY ARTERY BYPASS GRAFTING (CABG) X 3; Using Left Internal Mammary Artery (LIMA) and Right Leg Greater Saphenous Vein Graft (SVG);  LIMA to LAD, SVG to OM1, SVG to RCA,;  Surgeon: Lawrence Hall;  Location: [facility]  Service: Open Heart Surgery;  Laterality: N/A;   CYSTOSCOPY/RETROGRADE/URETEROSCOPY Left 08/21/2019   Procedure: CYSTOSCOPY/RETROGRADE/URETEROSCOPY/  LEFT BIOPSY/BRUSH BIOPSY/RENAL WASHINGS/ AND STENT PLACEMENT;  Surgeon: Lawrence Bring Hall;  Location[facility];  Service: Urology;  Laterality: Left;   IR IMAGING GUIDED PORT INSERTION  09/28/2019   RIGHT/LEFT HEART CATH AND CORONARY ANGIOGRAPHY N/A 04/26/2018   Procedure: RIGHT/LEFT HEART CATH AND CORONARY ANGIOGRAPHY;  Surgeon: KTroy Sine Hall;  [facility];  Service: Cardiovascular;  Laterality: N/A;   ROBOT ASSISTED LAPAROSCOPIC RADICAL PROSTATECTOMY  08/10/2011   Procedure: ROBOTIC ASSISTED LAPAROSCOPIC RADICAL PROSTATECTOMY LEVEL 2;  Surgeon: LDutch Gray Hall;  Location[facility];  Service: Urology;  Laterality: N/A;   ROBOT ASSITED LAPAROSCOPIC NEPHROURETERECTOMY Left 01/04/2020   Procedure: XI ROBOT ASSITED LAPAROSCOPIC NEPHROURETERECTOMY;  Surgeon: Lawrence Bring Hall;  [facility];  Service: Urology;  Laterality: Left;  NEEDS 240 MIN FOR PROCEDURE   TEE WITHOUT CARDIOVERSION N/A 04/29/2018   Procedure: TRANSESOPHAGEAL ECHOCARDIOGRAM (TEE);  Surgeon: VPrescott Hall PCollier Salina Hall;  [place] [facility]  Service: Open Heart Surgery;  Laterality: N/A;   THYROID SURGERY  infant   uncertain details; no thyroid  replacement listed   WISDOM TOOTH EXTRACTION      FAMILY HISTORY:  Family History  Problem Relation Age of Onset   Prostate cancer Father 737      in his 70's/other comorbidities/79 deceased   Alcohol abuse Father    Liver disease Father    Heart attack Father 766  Breast cancer Mother 669      mets to bone,deceased age 77  Cervical cancer Sister    Cervical cancer Sister    Breast cancer Sister    CAD Brother        Died of sudden cardiac death/MI    SOCIAL HISTORY:  Social History   Socioeconomic History   Marital status: Married    Spouse name: DHilda Blades   Number of children: 0   Years of education: 15 years    Highest education level: Not on file  Occupational History    Employer: [employer]    Comment: customer service [employer]  Tobacco Use  Smoking status: Never   Smokeless tobacco: Never  Vaping Use   Vaping Use: Never used  Substance and Sexual Activity   Alcohol use: Not Currently   Drug use: No   Sexual activity: Not on file  Other Topics Concern   Not on file  Social History Narrative   Not on file   Social Determinants of Health   Financial Resource Strain: Low Risk    Difficulty of Paying Living Expenses: Not hard at all  Food Insecurity: No Food Insecurity   Worried About Charity fundraiser in the Last Year: Never true   [place] in the Last Year: Never true  Transportation Needs: No Transportation Needs   Lack of Transportation (Medical): No   Lack of Transportation (Non-Medical): No  Physical Activity: Inactive   Days of Exercise per Week: 0 days   Minutes of Exercise per Session: 0 min  Stress: No Stress Concern Present   Feeling of Stress : Not at all  Social Connections: Moderately Integrated   Frequency of Communication with Friends and Family: Once a week   Frequency of Social Gatherings with Friends and Family: Twice a week   Attends Religious Services: 1 to 4 times per year   Active Member of [employer] or Organizations:  No   Attends Music therapist: Never   Marital Status: Married  Human resources officer Violence: Not At Risk   Fear of Current or Ex-Partner: No   Emotionally Abused: No   Physically Abused: No   Sexually Abused: No    ALLERGIES: Other  MEDICATIONS:  Current Outpatient Medications  Medication Sig Dispense Refill   acetaminophen (TYLENOL) 500 MG tablet Take 500 mg by mouth every 6 (six) hours as needed for moderate pain.     amLODipine (NORVASC) 2.5 MG tablet TAKE 1 TABLET ONCE DAILY. 30 tablet 6   aspirin EC 81 MG tablet Take 1 tablet (81 mg total) by mouth daily. Swallow whole. 30 tablet 11   atorvastatin (LIPITOR) 80 MG tablet TAKE 1 TABLET ONCE DAILY AT 6 PM. 90 tablet 3   Biotin 1000 MCG tablet Take 1,000 mcg by mouth once a week.     metoprolol succinate (TOPROL-XL) 50 MG 24 hr tablet Take 1 tablet (50 mg total) by mouth at bedtime. 90 tablet 3   Multiple Vitamins-Minerals (IMMUNE SUPPORT VITAMIN C PO) Take by mouth.     nitroGLYCERIN (NITROSTAT) 0.4 MG SL tablet 1 TAB UNDER TONGUE AS NEEDED FOR CHEST PAIN. MAY REPEAT EVERY 5 MIN FOR A TOTAL OF 3 DOSES. 25 tablet 4   No current facility-administered medications for this encounter.    REVIEW OF SYSTEMS:  On review of systems, the patient reports that he is doing well overall. h denies any chest pain, shortness of breath, cough, fevers, chills, night sweats, unintended weight changes. he denies any bowel or bladder disturbances, and denies abdominal pain, nausea or vomiting. he denies any new musculoskeletal or joint aches or pains. A complete review of systems is obtained and is otherwise negative.    PHYSICAL EXAM:  Wt Readings from Last 3 Encounters:  10/31/20 186 lb (84.4 kg)  10/25/20 189 lb 6 oz (85.9 kg)  09/06/20 203 lb 12.8 oz (92.4 kg)   Temp Readings from Last 3 Encounters:  10/31/20 (!) 97.3 F (36.3 C)  10/25/20 98.1 F (36.7 C) (Oral)  09/13/20 98 F (36.7 C) (Oral)   BP Readings from Last 3  Encounters:  10/31/20 119/71  10/25/20  116/70  09/13/20 140/83   Pulse Readings from Last 3 Encounters:  10/31/20 77  10/25/20 86  09/13/20 64    /10  In general this is a well appearing in no acute distress. He is alert and oriented x4 and appropriate throughout the examination.   On a limited examination of the right upper extremity, patient has a well-healed linear incision along the length of the right upper arm with 2 smaller incisions above the antecubital fossa.  KPS = 80  100 - Normal; no complaints; no evidence of disease. 90   - Able to carry on normal activity; minor signs or symptoms of disease. 80   - Normal activity with effort; some signs or symptoms of disease. 43   - Cares for self; unable to carry on normal activity or to do active work. 60   - Requires occasional assistance, but is able to care for most of his personal needs. 50   - Requires considerable assistance and frequent medical care. 49   - Disabled; requires special care and assistance. 70   - Severely disabled; hospital admission is indicated although death not imminent. 32   - Very sick; hospital admission necessary; active supportive treatment necessary. 10   - Moribund; fatal processes progressing rapidly. 0     - Dead  Karnofsky DA, Abelmann [place], Craver LS and Burchenal [facility] [phone]) The use of the nitrogen mustards in the palliative treatment of carcinoma: with particular reference to bronchogenic carcinoma Cancer 1 [id number]  LABORATORY DATA:  Lab Results  Component Value Date   WBC 6.9 09/13/2020   HGB 11.0 (L) 09/13/2020   HCT 32.5 (L) 09/13/2020   MCV 86.0 09/13/2020   PLT 220 09/13/2020   Lab Results  Component Value Date   NA 140 09/13/2020   K 4.0 09/13/2020   CL 106 09/13/2020   CO2 24 09/13/2020   Lab Results  Component Value Date   ALT 16 09/13/2020   AST 29 09/13/2020   ALKPHOS 84 09/13/2020   BILITOT 0.8 09/13/2020     RADIOGRAPHY: No results found.     IMPRESSION/PLAN: 1. 75 y.o. male with stage IV high-grade urothelial carcinoma of the renal pelvis with bone and potentially hepatic involvement  s/p resection and fixation of right humerus for metastatic disease.  Today, I talked to the patient and family about the findings and workup thus far. We discussed the natural history of metastatic urothelial carcinoma and general treatment, highlighting the role of radiotherapy in the management. We discussed the available radiation techniques, and focused on the details and logistics of delivery. We reviewed the anticipated acute and late sequelae associated with radiation in this setting. The patient was encouraged to ask questions that were answered to his/her satisfaction.  During our conversation today, the patient and his wife had several good questions and seemed understand the recommendation for postoperative radiation to the right humerus for the purpose of hardware stabilization and prevention of future pain.  Prior to proceeding with treatment, patient would like to follow-up with his orthopedist in early August and then undergo radiation thereafter.  Today, I scheduled the patient to return on August 16 at 2:30 for CT simulation.  I would anticipate treating the right humerus including the length of the hardware to a total dose of 30 Hall in 10 fractions excluding a strip of skin to help prevent lymphedema.  I personally spent 30 minutes in this encounter including chart review, reviewing radiological studies, meeting face-to-face with the patient,  entering orders and completing documentation.   ------------------------------------------------   Tyler Pita, Hall [place] Director and Director of Stereotactic Radiosurgery Direct Dial: [phone]  Fax: [phone] [email or web address]  Skype  LinkedIn

## 2020-10-31 ENCOUNTER — Ambulatory Visit: Payer: PPO | Admitting: Radiation Oncology

## 2020-10-31 ENCOUNTER — Other Ambulatory Visit: Payer: Self-pay

## 2020-10-31 ENCOUNTER — Ambulatory Visit
Admission: RE | Admit: 2020-10-31 | Discharge: 2020-10-31 | Disposition: A | Discharge status: Home / Self Care | Payer: PPO | Source: Ambulatory Visit | Attending: Radiation Oncology | Admitting: Radiation Oncology

## 2020-10-31 VITALS — BP 119/71 | HR 77 | Temp 97.3°F | Resp 18 | Wt 186.0 lb

## 2020-10-31 DIAGNOSIS — C652 Malignant neoplasm of left renal pelvis: Secondary | ICD-10-CM | POA: Diagnosis not present

## 2020-10-31 DIAGNOSIS — C7951 Secondary malignant neoplasm of bone: Secondary | ICD-10-CM | POA: Insufficient documentation

## 2020-10-31 DIAGNOSIS — Z8546 Personal history of malignant neoplasm of prostate: Secondary | ICD-10-CM | POA: Insufficient documentation

## 2020-10-31 DIAGNOSIS — C679 Malignant neoplasm of bladder, unspecified: Secondary | ICD-10-CM | POA: Diagnosis not present

## 2020-11-01 ENCOUNTER — Ambulatory Visit: Payer: PPO | Admitting: Family Medicine

## 2020-11-01 ENCOUNTER — Other Ambulatory Visit: Payer: Self-pay

## 2020-11-01 ENCOUNTER — Inpatient Hospital Stay: Payer: PPO

## 2020-11-01 VITALS — BP 149/73 | HR 79 | Temp 98.3°F | Resp 18 | Wt 187.8 lb

## 2020-11-01 DIAGNOSIS — C679 Malignant neoplasm of bladder, unspecified: Secondary | ICD-10-CM

## 2020-11-01 DIAGNOSIS — Z95828 Presence of other vascular implants and grafts: Secondary | ICD-10-CM

## 2020-11-01 DIAGNOSIS — C652 Malignant neoplasm of left renal pelvis: Secondary | ICD-10-CM

## 2020-11-01 DIAGNOSIS — Z5112 Encounter for antineoplastic immunotherapy: Secondary | ICD-10-CM | POA: Diagnosis not present

## 2020-11-01 LAB — CBC WITH DIFFERENTIAL (CANCER CENTER ONLY)
Abs Immature Granulocytes: 0.02 10*3/uL (ref 0.00–0.07)
Basophils Absolute: 0 10*3/uL (ref 0.0–0.1)
Basophils Relative: 1 %
Eosinophils Absolute: 0.2 10*3/uL (ref 0.0–0.5)
Eosinophils Relative: 3 %
HCT: 28.5 % — ABNORMAL LOW (ref 39.0–52.0)
Hemoglobin: 9.6 g/dL — ABNORMAL LOW (ref 13.0–17.0)
Immature Granulocytes: 0 %
Lymphocytes Relative: 14 %
Lymphs Abs: 0.9 10*3/uL (ref 0.7–4.0)
MCH: 28.7 pg (ref 26.0–34.0)
MCHC: 33.7 g/dL (ref 30.0–36.0)
MCV: 85.3 fL (ref 80.0–100.0)
Monocytes Absolute: 0.5 10*3/uL (ref 0.1–1.0)
Monocytes Relative: 8 %
Neutro Abs: 4.4 10*3/uL (ref 1.7–7.7)
Neutrophils Relative %: 74 %
Platelet Count: 238 10*3/uL (ref 150–400)
RBC: 3.34 MIL/uL — ABNORMAL LOW (ref 4.22–5.81)
RDW: 14.6 % (ref 11.5–15.5)
WBC Count: 6 10*3/uL (ref 4.0–10.5)
nRBC: 0 % (ref 0.0–0.2)

## 2020-11-01 LAB — CMP (CANCER CENTER ONLY)
ALT: 15 U/L (ref 0–44)
AST: 24 U/L (ref 15–41)
Albumin: 3.3 g/dL — ABNORMAL LOW (ref 3.5–5.0)
Alkaline Phosphatase: 107 U/L (ref 38–126)
Anion gap: 8 (ref 5–15)
BUN: 27 mg/dL — ABNORMAL HIGH (ref 8–23)
CO2: 24 mmol/L (ref 22–32)
Calcium: 9.1 mg/dL (ref 8.9–10.3)
Chloride: 105 mmol/L (ref 98–111)
Creatinine: 1.41 mg/dL — ABNORMAL HIGH (ref 0.61–1.24)
GFR, Estimated: 51 mL/min — ABNORMAL LOW (ref >60)
Glucose, Bld: 174 mg/dL — ABNORMAL HIGH (ref 70–99)
Potassium: 3.9 mmol/L (ref 3.5–5.1)
Sodium: 137 mmol/L (ref 135–145)
Total Bilirubin: 0.8 mg/dL (ref 0.3–1.2)
Total Protein: 7.4 g/dL (ref 6.5–8.1)

## 2020-11-01 LAB — TSH: TSH: 1.954 u[IU]/mL (ref 0.320–4.118)

## 2020-11-01 MED ORDER — SODIUM CHLORIDE 0.9 % IV SOLN
400.0000 mg | Freq: Once | INTRAVENOUS | Status: AC
  Administered 2020-11-01: 400 mg via INTRAVENOUS
  Filled 2020-11-01: qty 16

## 2020-11-01 MED ORDER — HEPARIN SOD (PORK) LOCK FLUSH 100 UNIT/ML IV SOLN
500.0000 [IU] | Freq: Once | INTRAVENOUS | Status: AC | PRN
  Administered 2020-11-01: 500 [IU]
  Filled 2020-11-01: qty 5

## 2020-11-01 MED ORDER — SODIUM CHLORIDE 0.9 % IV SOLN
Freq: Once | INTRAVENOUS | Status: AC
  Filled 2020-11-01: qty 250

## 2020-11-01 MED ORDER — SODIUM CHLORIDE 0.9% FLUSH
10.0000 mL | INTRAVENOUS | Status: DC | PRN
  Administered 2020-11-01: 10 mL
  Filled 2020-11-01: qty 10

## 2020-11-01 MED ORDER — SODIUM CHLORIDE 0.9% FLUSH
10.0000 mL | Freq: Once | INTRAVENOUS | Status: AC
Start: 2020-11-01 — End: 2020-11-01
  Administered 2020-11-01: 10 mL
  Filled 2020-11-01: qty 10

## 2020-11-01 NOTE — Patient Instructions (Signed)
[facility] MEDICAL ONCOLOGY  ° Discharge Instructions: °Thank you for choosing [facility] to provide your oncology and hematology care.  ° °If you have a lab appointment with the Cancer Center, please go directly to the Cancer Center and check in at the registration area. °  °Wear comfortable clothing and clothing appropriate for easy access to any Portacath or PICC line.  ° °We strive to give you quality time with your provider. You may need to reschedule your appointment if you arrive late (15 or more minutes).  Arriving late affects you and other patients whose appointments are after yours.  Also, if you miss three or more appointments without notifying the office, you may be dismissed from the clinic at the provider’s discretion.    °  °For prescription refill requests, have your pharmacy contact our office and allow 72 hours for refills to be completed.   ° °Today you received the following chemotherapy and/or immunotherapy agents: Pembrolizumab (Keytruda) °  °To help prevent nausea and vomiting after your treatment, we encourage you to take your nausea medication as directed. ° °BELOW ARE SYMPTOMS THAT SHOULD BE REPORTED IMMEDIATELY: °*FEVER GREATER THAN 100.4 F (38 °C) OR HIGHER °*CHILLS OR SWEATING °*NAUSEA AND VOMITING THAT IS NOT CONTROLLED WITH YOUR NAUSEA MEDICATION °*UNUSUAL SHORTNESS OF BREATH °*UNUSUAL BRUISING OR BLEEDING °*URINARY PROBLEMS (pain or burning when urinating, or frequent urination) °*BOWEL PROBLEMS (unusual diarrhea, constipation, pain near the anus) °TENDERNESS IN MOUTH AND THROAT WITH OR WITHOUT PRESENCE OF ULCERS (sore throat, sores in mouth, or a toothache) °UNUSUAL RASH, SWELLING OR PAIN  °UNUSUAL VAGINAL DISCHARGE OR ITCHING  ° °Items with * indicate a potential emergency and should be followed up as soon as possible or go to the Emergency Department if any problems should occur. ° °Please show the CHEMOTHERAPY ALERT CARD or IMMUNOTHERAPY ALERT CARD  at check-in to the Emergency Department and triage nurse. ° °Should you have questions after your visit or need to cancel or reschedule your appointment, please contact [facility]  Dept: [phone]  and follow the prompts.  Office hours are 8:00 a.m. to 4:30 p.m. Monday - Friday. Please note that voicemails left after 4:00 p.m. may not be returned until the following business day.  We are closed weekends and major holidays. You have access to a nurse at all times for urgent questions. Please call the main number to the clinic Dept: [phone] and follow the prompts. ° ° °For any non-urgent questions, you may also contact your provider using MyChart. We now offer e-Visits for anyone 18 and older to request care online for non-urgent symptoms. For details visit [email or web address]. °  °Also download the MyChart app! Go to the app store, search "MyChart", open the app, select [facility], and log in with your MyChart username and password. ° °Due to Covid, a mask is required upon entering the hospital/clinic. If you do not have a mask, one will be given to you upon arrival. For doctor visits, patients may have 1 support person aged 18 or older with them. For treatment visits, patients cannot have anyone with them due to current Covid guidelines and our immunocompromised population.  ° °

## 2020-11-08 ENCOUNTER — Telehealth: Payer: Self-pay | Admitting: Cardiovascular Disease

## 2020-11-08 NOTE — Telephone Encounter (Signed)
    STAT if patient feels like he/she is going to faint   Are you dizzy now? No  Do you feel faint or have you passed out? Yes  Do you have any other symptoms? Lightheadedness   Have you checked your HR and BP (record if available)? None   Pt's wife said, pt been feeling lightheadedness and sometimes feel like he is going to pass out, she said she is anxious because pt is getting ready to start getting radiation and immune infusion soon, she is concern because she doesn't know where his lightheadedness coming from, she wanted to to rule out pt's heart issue

## 2020-11-08 NOTE — Telephone Encounter (Signed)
LVM for return call. 

## 2020-11-20 NOTE — Telephone Encounter (Signed)
Left message for pt to call.

## 2020-11-22 ENCOUNTER — Other Ambulatory Visit: Payer: Self-pay

## 2020-11-22 ENCOUNTER — Encounter: Payer: Self-pay | Admitting: Family Medicine

## 2020-11-22 ENCOUNTER — Ambulatory Visit (INDEPENDENT_AMBULATORY_CARE_PROVIDER_SITE_OTHER): Payer: PPO | Admitting: Family Medicine

## 2020-11-22 VITALS — BP 108/58 | HR 76 | Temp 98.6°F | Ht 72.0 in | Wt 184.6 lb

## 2020-11-22 DIAGNOSIS — R634 Abnormal weight loss: Secondary | ICD-10-CM | POA: Diagnosis not present

## 2020-11-22 DIAGNOSIS — C7951 Secondary malignant neoplasm of bone: Secondary | ICD-10-CM

## 2020-11-22 DIAGNOSIS — R454 Irritability and anger: Secondary | ICD-10-CM

## 2020-11-22 DIAGNOSIS — R42 Dizziness and giddiness: Secondary | ICD-10-CM

## 2020-11-22 DIAGNOSIS — E785 Hyperlipidemia, unspecified: Secondary | ICD-10-CM

## 2020-11-22 DIAGNOSIS — D649 Anemia, unspecified: Secondary | ICD-10-CM | POA: Diagnosis not present

## 2020-11-22 DIAGNOSIS — I10 Essential (primary) hypertension: Secondary | ICD-10-CM | POA: Diagnosis not present

## 2020-11-22 DIAGNOSIS — R5383 Other fatigue: Secondary | ICD-10-CM | POA: Diagnosis not present

## 2020-11-22 LAB — COMPREHENSIVE METABOLIC PANEL
ALT: 19 U/L (ref 0–53)
AST: 24 U/L (ref 0–37)
Albumin: 3.8 g/dL (ref 3.5–5.2)
Alkaline Phosphatase: 107 U/L (ref 39–117)
BUN: 30 mg/dL — ABNORMAL HIGH (ref 6–23)
CO2: 25 mEq/L (ref 19–32)
Calcium: 9.2 mg/dL (ref 8.4–10.5)
Chloride: 101 mEq/L (ref 96–112)
Creatinine, Ser: 1.37 mg/dL (ref 0.40–1.50)
GFR: 49.84 mL/min — ABNORMAL LOW (ref >60.00)
Glucose, Bld: 126 mg/dL — ABNORMAL HIGH (ref 70–99)
Potassium: 3.9 mEq/L (ref 3.5–5.1)
Sodium: 137 mEq/L (ref 135–145)
Total Bilirubin: 0.8 mg/dL (ref 0.2–1.2)
Total Protein: 7.2 g/dL (ref 6.0–8.3)

## 2020-11-22 LAB — CBC WITH DIFFERENTIAL/PLATELET
Basophils Absolute: 0 10*3/uL (ref 0.0–0.1)
Basophils Relative: 0.5 % (ref 0.0–3.0)
Eosinophils Absolute: 0.2 10*3/uL (ref 0.0–0.7)
Eosinophils Relative: 2.3 % (ref 0.0–5.0)
HCT: 30.4 % — ABNORMAL LOW (ref 39.0–52.0)
Hemoglobin: 10.2 g/dL — ABNORMAL LOW (ref 13.0–17.0)
Lymphocytes Relative: 11.7 % — ABNORMAL LOW (ref 12.0–46.0)
Lymphs Abs: 0.8 10*3/uL (ref 0.7–4.0)
MCHC: 33.4 g/dL (ref 30.0–36.0)
MCV: 82.3 fl (ref 78.0–100.0)
Monocytes Absolute: 0.7 10*3/uL (ref 0.1–1.0)
Monocytes Relative: 9.4 % (ref 3.0–12.0)
Neutro Abs: 5.3 10*3/uL (ref 1.4–7.7)
Neutrophils Relative %: 76.1 % (ref 43.0–77.0)
Platelets: 304 10*3/uL (ref 150.0–400.0)
RBC: 3.7 Mil/uL — ABNORMAL LOW (ref 4.22–5.81)
RDW: 15.4 % (ref 11.5–15.5)
WBC: 7 10*3/uL (ref 4.0–10.5)

## 2020-11-22 LAB — VITAMIN B12: Vitamin B-12: 506 pg/mL (ref 211–911)

## 2020-11-22 LAB — TSH: TSH: 2.7 u[IU]/mL (ref 0.35–5.50)

## 2020-11-22 LAB — IBC + FERRITIN
Ferritin: 686.4 ng/mL — ABNORMAL HIGH (ref 22.0–322.0)
Iron: 39 ug/dL — ABNORMAL LOW (ref 42–165)
Saturation Ratios: 17.3 % — ABNORMAL LOW (ref 20.0–50.0)
Transferrin: 161 mg/dL — ABNORMAL LOW (ref 212.0–360.0)

## 2020-11-22 LAB — FOLATE: Folate: 6.2 ng/mL (ref >5.9)

## 2020-11-22 LAB — FERRITIN: Ferritin: 686.4 ng/mL — ABNORMAL HIGH (ref 22.0–322.0)

## 2020-11-22 LAB — VITAMIN D 25 HYDROXY (VIT D DEFICIENCY, FRACTURES): VITD: 45.09 ng/mL (ref 30.00–100.00)

## 2020-11-22 NOTE — Progress Notes (Signed)
Lawrence Hall DOB: 1945/05/27 Encounter date: 11/22/2020  This is a 75 y.o. male who presents with Chief Complaint  Patient presents with   Stress    History of present illness:  Dx  renal CA September. Treated and plan was nephrectomy. Plan was for monitoring a year. Then was referred to specialist for shoulder pain and was doing ok until May. This was when he had follow up for renal status. Also had imaging on arm and cancer was found in humerus. Oncologist also found some lesions in liver shortly after. Plan now is simulation for radiation on 8/16; then 10 days in a row of 15 min radiation and q 3-4 weeks for liver treatment. Follow up imaging towards end of year to check progress.   Felt psychological set back with these new diagnoses. Feels more easily overwhelmed. He reacts more negatively. Harder to control irritability.   Has episodes of feeling light headed, usually with walking and has to grab on to wall. He has lost weight since everything started. Metoprolol was cut from 100 down to '50mg'$  when he first complained of symptoms. Not regularly checking bp at home. Takes bp medications at bedtime. Light headed episodes are more in the afternoon.   Appetite is poor. Water intake is not great. Feels like he is having to force self to eat. Has carnation instant breakfast, boost, protein bars. Trying to do little bits of things throughout day.   Here with wife today. She states that she worries because historically he has minimized some very big issues (ie chest discomfort requiring cath/bypass) and he is worried about planning for upcoming procedures/details of upcoming treatments so he doesn't want to focus on the little symptoms along the way.    Allergies  Allergen Reactions   Other Itching and Other (See Comments)    Pet dander, seasonal allergies = Runny nose, itchy eyes   Current Meds  Medication Sig   acetaminophen (TYLENOL) 500 MG tablet Take 500 mg by mouth as needed for  moderate pain.   amLODipine (NORVASC) 2.5 MG tablet TAKE 1 TABLET ONCE DAILY.   aspirin EC 81 MG tablet Take 1 tablet (81 mg total) by mouth daily. Swallow whole.   atorvastatin (LIPITOR) 80 MG tablet TAKE 1 TABLET ONCE DAILY AT 6 PM.   cholecalciferol (VITAMIN D3) 25 MCG (1000 UNIT) tablet Take 1,000 Units by mouth daily.   metoprolol succinate (TOPROL-XL) 50 MG 24 hr tablet Take 1 tablet (50 mg total) by mouth at bedtime.   Milk Thistle 1000 MG CAPS Take 1 capsule by mouth daily.   Multiple Vitamins-Minerals (IMMUNE SUPPORT VITAMIN C PO) Take by mouth.   nitroGLYCERIN (NITROSTAT) 0.4 MG SL tablet 1 TAB UNDER TONGUE AS NEEDED FOR CHEST PAIN. MAY REPEAT EVERY 5 MIN FOR A TOTAL OF 3 DOSES.   VITAMIN E PO Take 500 mg by mouth.    Review of Systems  Constitutional:  Positive for fatigue. Negative for chills and fever.  Respiratory:  Negative for cough, chest tightness, shortness of breath and wheezing.   Cardiovascular:  Negative for chest pain, palpitations and leg swelling.  Psychiatric/Behavioral:  Positive for agitation.    Objective:  BP (!) 108/58 (BP Location: Left Arm, Patient Position: Sitting, Cuff Size: Large)   Pulse 76   Temp 98.6 F (37 C) (Oral)   Ht 6' (1.829 m)   Wt 184 lb 9.6 oz (83.7 kg)   SpO2 97%   BMI 25.04 kg/m   Weight: 184 lb 9.6  oz (83.7 kg)   BP Readings from Last 3 Encounters:  11/22/20 (!) 108/58  11/01/20 (!) 149/73  10/31/20 119/71   Wt Readings from Last 3 Encounters:  11/22/20 184 lb 9.6 oz (83.7 kg)  11/01/20 187 lb 12.8 oz (85.2 kg)  10/31/20 186 lb (84.4 kg)    Physical Exam Constitutional:      General: He is not in acute distress.    Appearance: He is well-developed.  Cardiovascular:     Rate and Rhythm: Normal rate and regular rhythm.     Heart sounds: Normal heart sounds. No murmur heard.   No friction rub.  Pulmonary:     Effort: Pulmonary effort is normal. No respiratory distress.     Breath sounds: Normal breath sounds. No  wheezing or rales.  Musculoskeletal:     Right lower leg: No edema.     Left lower leg: No edema.  Neurological:     Mental Status: He is alert and oriented to person, place, and time.  Psychiatric:        Behavior: Behavior normal.    Assessment/Plan 1. Weight loss We are going to start with bloodwork. He is working on getting more meals through day and states he is going to do a better job of this and not skipping meals.   2. Anemia, unspecified type No loss of blood in stool, urine. Checking for things that are more easy to supplement/correct to help with energy. - CBC with Differential/Platelet; Future - Vitamin B12; Future - Ferritin; Future - IBC + Ferritin; Future - Folate; Future - CBC with Differential/Platelet - Vitamin B12 - Ferritin - IBC + Ferritin - Folate  3. Fatigue, unspecified type Will monitor bp; gave instructions for this. Suspect this contributes to him not feeling as well, light headed. Consider decreased amlodipine ose. - Comprehensive metabolic panel; Future - TSH; Future - VITAMIN D 25 Hydroxy (Vit-D Deficiency, Fractures); Future - Comprehensive metabolic panel - TSH - VITAMIN D 25 Hydroxy (Vit-D Deficiency, Fractures)  4. Light-headed feeling See above.  5. Irritability Discussed therapy, ssri option or benzodiazepine if more anxiety. He has some interest in SSRI option. Given info on lexapro. He wants to see how bloodwork looks and how he feels with focus on drinking properly and eating well. He will let me know if interested in starting medication.   6. Essential hypertension Low today; see above.   7. Metastasis to bone [facility]) Following with oncology, rad onc. Has upcoming appointment and has questions prepared to ask specialist for more details of this planned treatment.  8. Hyperlipidemia, unspecified hyperlipidemia type Continue with lipitor '80mg'$  daily.   Return for pending lab results.      [name], MD

## 2020-11-22 NOTE — Telephone Encounter (Signed)
Patient was seen by his medical doctor today

## 2020-11-22 NOTE — Patient Instructions (Addendum)
*  check blood pressures daily at home. If you are regularly less than 115/65 then let myself or cardiology know. If pressure tonight is still less than 120/70 it would be ok to trial half tab of amlodipine and check bp.

## 2020-11-25 DIAGNOSIS — Z4789 Encounter for other orthopedic aftercare: Secondary | ICD-10-CM | POA: Diagnosis not present

## 2020-11-25 DIAGNOSIS — Z9889 Other specified postprocedural states: Secondary | ICD-10-CM | POA: Diagnosis not present

## 2020-11-27 ENCOUNTER — Encounter: Payer: Self-pay | Admitting: Radiation Oncology

## 2020-11-28 ENCOUNTER — Telehealth: Payer: Self-pay | Admitting: Family Medicine

## 2020-11-28 NOTE — Chronic Care Management (AMB) (Signed)
  Chronic Care Management   Outreach Note  11/28/2020 Name: [facility] MRN: [id number] DOB: [birth year]  Referred by: [name], MD Reason for referral : No chief complaint on file.   An unsuccessful telephone outreach was attempted today. The patient was referred to the pharmacist for assistance with care management and care coordination.   Follow Up Plan:   Tatjana Dellinger Upstream Scheduler

## 2020-11-28 NOTE — Progress Notes (Signed)
  Chronic Care Management   Note  11/28/2020 Name: [name] MRN: [id number] DOB: [birth year]  [name] is a 75 y.o. year old male who is a primary care patient of [name], [name], MD. I reached out to [name] by phone today in response to a referral sent by Mr. Jamale Petrak [place]'s PCP, [name], MD.   Mr. Senick was given information about Chronic Care Management services today including:  CCM service includes personalized support from designated clinical staff supervised by his physician, including individualized plan of care and coordination with other care providers 24/7 contact phone numbers for assistance for urgent and routine care needs. Service will only be billed when office clinical staff spend 20 minutes or more in a month to coordinate care. Only one practitioner may furnish and bill the service in a calendar month. The patient may stop CCM services at any time (effective at the end of the month) by phone call to the office staff.   Patient agreed to services and verbal consent obtained.   Follow up plan:   Tatjana Secretary/administrator

## 2020-12-03 ENCOUNTER — Ambulatory Visit
Admission: RE | Admit: 2020-12-03 | Discharge: 2020-12-03 | Disposition: A | Discharge status: Home / Self Care | Payer: PPO | Source: Ambulatory Visit | Attending: Radiation Oncology | Admitting: Radiation Oncology

## 2020-12-03 ENCOUNTER — Other Ambulatory Visit: Payer: Self-pay

## 2020-12-03 DIAGNOSIS — C7951 Secondary malignant neoplasm of bone: Secondary | ICD-10-CM | POA: Diagnosis not present

## 2020-12-03 DIAGNOSIS — Z510 Encounter for antineoplastic radiation therapy: Secondary | ICD-10-CM | POA: Diagnosis not present

## 2020-12-03 DIAGNOSIS — C659 Malignant neoplasm of unspecified renal pelvis: Secondary | ICD-10-CM | POA: Insufficient documentation

## 2020-12-03 DIAGNOSIS — C679 Malignant neoplasm of bladder, unspecified: Secondary | ICD-10-CM | POA: Diagnosis not present

## 2020-12-03 NOTE — Progress Notes (Signed)
  Radiation Oncology         [phone] ________________________________  Name: Lawrence Hall MRN: [id number]  Date: 12/03/2020  DOB: June 13, 1945  SIMULATION AND TREATMENT PLANNING NOTE    ICD-10-CM   1. 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  Identity was confirmed.  All relevant records and images related to the planned course of therapy were reviewed.  The patient freely provided informed written consent to proceed with treatment after reviewing the details related to the planned course of therapy. The consent form was witnessed and verified by the simulation staff.  Then, the patient was set-up in a stable reproducible  supine and arms akimbo position for radiation therapy.  CT images were obtained.  Surface markings were placed.  The CT images were loaded into the planning software.  Then the target and avoidance structures were contoured.  Treatment planning then occurred.  The radiation prescription was entered and confirmed.  Then, I designed and supervised the construction of a total of 2 medically necessary complex treatment devices, in the form of [place] collimators to spare a strip of uninvolved dermal lymphatics and lung.  I have requested : Isodose Plan.   PLAN:  The patient will receive 30 Gy in 10 fractions.  ________________________________  Sheral Apley Tammi Klippel, M.D.

## 2020-12-04 ENCOUNTER — Other Ambulatory Visit: Payer: Self-pay | Admitting: Family Medicine

## 2020-12-04 DIAGNOSIS — C679 Malignant neoplasm of bladder, unspecified: Secondary | ICD-10-CM | POA: Diagnosis not present

## 2020-12-04 DIAGNOSIS — C7951 Secondary malignant neoplasm of bone: Secondary | ICD-10-CM | POA: Diagnosis not present

## 2020-12-04 DIAGNOSIS — Z510 Encounter for antineoplastic radiation therapy: Secondary | ICD-10-CM | POA: Diagnosis not present

## 2020-12-09 ENCOUNTER — Ambulatory Visit
Admission: RE | Admit: 2020-12-09 | Discharge: 2020-12-09 | Disposition: A | Discharge status: Home / Self Care | Payer: PPO | Source: Ambulatory Visit | Attending: Radiation Oncology | Admitting: Radiation Oncology

## 2020-12-09 ENCOUNTER — Other Ambulatory Visit: Payer: Self-pay

## 2020-12-09 DIAGNOSIS — Z510 Encounter for antineoplastic radiation therapy: Secondary | ICD-10-CM | POA: Diagnosis not present

## 2020-12-09 DIAGNOSIS — C7951 Secondary malignant neoplasm of bone: Secondary | ICD-10-CM | POA: Diagnosis not present

## 2020-12-09 DIAGNOSIS — C679 Malignant neoplasm of bladder, unspecified: Secondary | ICD-10-CM | POA: Diagnosis not present

## 2020-12-10 ENCOUNTER — Ambulatory Visit
Admission: RE | Admit: 2020-12-10 | Discharge: 2020-12-10 | Disposition: A | Discharge status: Home / Self Care | Payer: PPO | Source: Ambulatory Visit | Attending: Radiation Oncology | Admitting: Radiation Oncology

## 2020-12-10 DIAGNOSIS — Z510 Encounter for antineoplastic radiation therapy: Secondary | ICD-10-CM | POA: Diagnosis not present

## 2020-12-11 ENCOUNTER — Ambulatory Visit
Admission: RE | Admit: 2020-12-11 | Discharge: 2020-12-11 | Disposition: A | Discharge status: Home / Self Care | Payer: PPO | Source: Ambulatory Visit | Attending: Radiation Oncology | Admitting: Radiation Oncology

## 2020-12-11 DIAGNOSIS — Z510 Encounter for antineoplastic radiation therapy: Secondary | ICD-10-CM | POA: Diagnosis not present

## 2020-12-12 ENCOUNTER — Other Ambulatory Visit: Payer: Self-pay

## 2020-12-12 ENCOUNTER — Ambulatory Visit
Admission: RE | Admit: 2020-12-12 | Discharge: 2020-12-12 | Disposition: A | Discharge status: Home / Self Care | Payer: PPO | Source: Ambulatory Visit | Attending: Radiation Oncology | Admitting: Radiation Oncology

## 2020-12-12 DIAGNOSIS — Z510 Encounter for antineoplastic radiation therapy: Secondary | ICD-10-CM | POA: Diagnosis not present

## 2020-12-13 ENCOUNTER — Inpatient Hospital Stay: Payer: PPO | Attending: Oncology

## 2020-12-13 ENCOUNTER — Ambulatory Visit
Admission: RE | Admit: 2020-12-13 | Discharge: 2020-12-13 | Disposition: A | Discharge status: Home / Self Care | Payer: PPO | Source: Ambulatory Visit | Attending: Radiation Oncology | Admitting: Radiation Oncology

## 2020-12-13 ENCOUNTER — Inpatient Hospital Stay: Payer: PPO

## 2020-12-13 ENCOUNTER — Inpatient Hospital Stay: Payer: PPO | Admitting: Oncology

## 2020-12-13 VITALS — BP 121/69 | HR 84 | Temp 98.1°F | Resp 18 | Ht 72.0 in | Wt 179.5 lb

## 2020-12-13 DIAGNOSIS — C652 Malignant neoplasm of left renal pelvis: Secondary | ICD-10-CM

## 2020-12-13 DIAGNOSIS — C679 Malignant neoplasm of bladder, unspecified: Secondary | ICD-10-CM | POA: Diagnosis not present

## 2020-12-13 DIAGNOSIS — Z510 Encounter for antineoplastic radiation therapy: Secondary | ICD-10-CM | POA: Diagnosis not present

## 2020-12-13 DIAGNOSIS — E079 Disorder of thyroid, unspecified: Secondary | ICD-10-CM | POA: Insufficient documentation

## 2020-12-13 DIAGNOSIS — Z79899 Other long term (current) drug therapy: Secondary | ICD-10-CM | POA: Diagnosis not present

## 2020-12-13 DIAGNOSIS — Z7982 Long term (current) use of aspirin: Secondary | ICD-10-CM | POA: Diagnosis not present

## 2020-12-13 DIAGNOSIS — D649 Anemia, unspecified: Secondary | ICD-10-CM | POA: Diagnosis not present

## 2020-12-13 DIAGNOSIS — Z5112 Encounter for antineoplastic immunotherapy: Secondary | ICD-10-CM | POA: Diagnosis not present

## 2020-12-13 DIAGNOSIS — C787 Secondary malignant neoplasm of liver and intrahepatic bile duct: Secondary | ICD-10-CM | POA: Diagnosis not present

## 2020-12-13 DIAGNOSIS — C7951 Secondary malignant neoplasm of bone: Secondary | ICD-10-CM | POA: Diagnosis not present

## 2020-12-13 LAB — CMP (CANCER CENTER ONLY)
ALT: 22 U/L (ref 0–44)
AST: 26 U/L (ref 15–41)
Albumin: 3.2 g/dL — ABNORMAL LOW (ref 3.5–5.0)
Alkaline Phosphatase: 115 U/L (ref 38–126)
Anion gap: 10 (ref 5–15)
BUN: 23 mg/dL (ref 8–23)
CO2: 24 mmol/L (ref 22–32)
Calcium: 9.2 mg/dL (ref 8.9–10.3)
Chloride: 104 mmol/L (ref 98–111)
Creatinine: 1.43 mg/dL — ABNORMAL HIGH (ref 0.61–1.24)
GFR, Estimated: 50 mL/min — ABNORMAL LOW (ref >60)
Glucose, Bld: 134 mg/dL — ABNORMAL HIGH (ref 70–99)
Potassium: 4.1 mmol/L (ref 3.5–5.1)
Sodium: 138 mmol/L (ref 135–145)
Total Bilirubin: 1 mg/dL (ref 0.3–1.2)
Total Protein: 7.5 g/dL (ref 6.5–8.1)

## 2020-12-13 LAB — CBC WITH DIFFERENTIAL (CANCER CENTER ONLY)
Abs Immature Granulocytes: 0.03 10*3/uL (ref 0.00–0.07)
Basophils Absolute: 0 10*3/uL (ref 0.0–0.1)
Basophils Relative: 1 %
Eosinophils Absolute: 0.2 10*3/uL (ref 0.0–0.5)
Eosinophils Relative: 2 %
HCT: 28.1 % — ABNORMAL LOW (ref 39.0–52.0)
Hemoglobin: 9.1 g/dL — ABNORMAL LOW (ref 13.0–17.0)
Immature Granulocytes: 0 %
Lymphocytes Relative: 10 %
Lymphs Abs: 0.8 10*3/uL (ref 0.7–4.0)
MCH: 27 pg (ref 26.0–34.0)
MCHC: 32.4 g/dL (ref 30.0–36.0)
MCV: 83.4 fL (ref 80.0–100.0)
Monocytes Absolute: 0.8 10*3/uL (ref 0.1–1.0)
Monocytes Relative: 10 %
Neutro Abs: 5.6 10*3/uL (ref 1.7–7.7)
Neutrophils Relative %: 77 %
Platelet Count: 240 10*3/uL (ref 150–400)
RBC: 3.37 MIL/uL — ABNORMAL LOW (ref 4.22–5.81)
RDW: 15 % (ref 11.5–15.5)
WBC Count: 7.4 10*3/uL (ref 4.0–10.5)
nRBC: 0 % (ref 0.0–0.2)

## 2020-12-13 LAB — TSH: TSH: 1.844 u[IU]/mL (ref 0.320–4.118)

## 2020-12-13 MED ORDER — SODIUM CHLORIDE 0.9 % IV SOLN
400.0000 mg | Freq: Once | INTRAVENOUS | Status: AC
  Administered 2020-12-13: 400 mg via INTRAVENOUS
  Filled 2020-12-13: qty 16

## 2020-12-13 MED ORDER — SODIUM CHLORIDE 0.9% FLUSH
10.0000 mL | INTRAVENOUS | Status: DC | PRN
  Administered 2020-12-13: 10 mL

## 2020-12-13 MED ORDER — SODIUM CHLORIDE 0.9 % IV SOLN: Freq: Once | INTRAVENOUS | Status: AC

## 2020-12-13 MED ORDER — HEPARIN SOD (PORK) LOCK FLUSH 100 UNIT/ML IV SOLN
500.0000 [IU] | Freq: Once | INTRAVENOUS | Status: AC | PRN
  Administered 2020-12-13: 500 [IU]

## 2020-12-13 NOTE — Progress Notes (Signed)
Hematology and Oncology Follow Up Visit  Lawrence Hall [id number] 11-05-1945 75 y.o. 12/13/2020 11:41 AM [name], MDKoberlein, [name], MD   Principle Diagnosis: 76 year old man with left renal pelvis tumor diagnosed in 2021.  He developed stage IV high-grade urothelial carcinoma with bone and hepatic involvement.   Prior Therapy: He is status post biopsy obtained on Aug 21, 2019 which showed high-grade urothelial carcinoma of the left renal pelvis.   He status post neoadjuvant chemotherapy utilizing gemcitabine and cisplatin given on day 1 and cisplatin given on day 8 start her on 10/06/2019.   He completed 3 cycles of therapy in August 2021.  He is status post left robotic assisted laparoscopic nephroureterectomy and retroperitoneal lymphadenectomy completed on 01/04/2020.  He was found to have a residual T4 disease with 1 out of 5 lymph nodes involved.  He is status post tumor resection from his right humerus with surgical fixation and rodding completed by Dr. Lovena Le at [facility] completed in June 2022.  The final pathology confirmed the presence of metastatic urothelial carcinoma.  Current therapy:   Pembrolizumab 200 mg started on Sep 13, 2020.  He is currently receiving 400 mg every 6 weeks starting with second cycle of therapy in July 2022.  Radiation therapy to be right humerus.  He is planned to complete 10 fractions for a total of 30 Gray.  Interim History: Lawrence Hall is here for return evaluation.  Since her last visit, he is receiving radiation therapy to the right humerus without any complications.  He tolerated the last cycle of Pembrolizumab without any major complaints.  He does report some mild fatigue tiredness no shortness of breath or dyspnea on exertion.  He is able to drive short distances but prefers not to.  His mobility continues to improve and is range of motion in the right arm is improving.  Denies any changes in his bowel habits including diarrhea or  abdominal pain.     Medications:  updated on review. Current Outpatient Medications  Medication Sig Dispense Refill   acetaminophen (TYLENOL) 500 MG tablet Take 500 mg by mouth as needed for moderate pain.     amLODipine (NORVASC) 2.5 MG tablet TAKE 1 TABLET ONCE DAILY. 30 tablet 6   aspirin EC 81 MG tablet Take 1 tablet (81 mg total) by mouth daily. Swallow whole. 30 tablet 11   atorvastatin (LIPITOR) 80 MG tablet TAKE 1 TABLET ONCE DAILY AT 6 PM. 90 tablet 3   cholecalciferol (VITAMIN D3) 25 MCG (1000 UNIT) tablet Take 1,000 Units by mouth daily.     metoprolol succinate (TOPROL-XL) 50 MG 24 hr tablet Take 1 tablet (50 mg total) by mouth at bedtime. 90 tablet 3   Milk Thistle 1000 MG CAPS Take 1 capsule by mouth daily.     Multiple Vitamins-Minerals (IMMUNE SUPPORT VITAMIN C PO) Take by mouth.     nitroGLYCERIN (NITROSTAT) 0.4 MG SL tablet 1 TAB UNDER TONGUE AS NEEDED FOR CHEST PAIN. MAY REPEAT EVERY 5 MIN FOR A TOTAL OF 3 DOSES. 25 tablet 4   VITAMIN E PO Take 500 mg by mouth.     No current facility-administered medications for this visit.     Allergies:  Allergies  Allergen Reactions   Other Itching and Other (See Comments)    Pet dander, seasonal allergies = Runny nose, itchy eyes      Physical Exam:   Blood pressure 121/69, pulse 84, temperature 98.1 F (36.7 C), temperature source Oral, resp. rate  18, height 6' (1.829 m), weight 179 lb 8 oz (81.4 kg), SpO2 100 %.    ECOG: 1     General appearance: Alert, awake without any distress. Head: Atraumatic without abnormalities Oropharynx: Without any thrush or ulcers. Eyes: No scleral icterus. Lymph nodes: No lymphadenopathy noted in the cervical, supraclavicular, or axillary nodes Heart:regular rate and rhythm, without any murmurs or gallops.   Lung: Clear to auscultation without any rhonchi, wheezes or dullness to percussion. Abdomin: Soft, nontender without any shifting dullness or ascites. Musculoskeletal: No  clubbing or cyanosis. Neurological: No motor or sensory deficits. Skin: No rashes or lesions.            Lab Results: Lab Results  Component Value Date   WBC 7.0 11/22/2020   HGB 10.2 (L) 11/22/2020   HCT 30.4 (L) 11/22/2020   MCV 82.3 11/22/2020   PLT 304.0 11/22/2020     Chemistry      Component Value Date/Time   NA 137 11/22/2020 1121   NA 137 04/07/2019 1021   K 3.9 11/22/2020 1121   CL 101 11/22/2020 1121   CO2 25 11/22/2020 1121   BUN 30 (H) 11/22/2020 1121   BUN 29 (H) 04/07/2019 1021   CREATININE 1.37 11/22/2020 1121   CREATININE 1.41 (H) 11/01/2020 1115      Component Value Date/Time   CALCIUM 9.2 11/22/2020 1121   ALKPHOS 107 11/22/2020 1121   AST 24 11/22/2020 1121   AST 24 11/01/2020 1115   ALT 19 11/22/2020 1121   ALT 15 11/01/2020 1115   BILITOT 0.8 11/22/2020 1121   BILITOT 0.8 11/01/2020 1115         Impression and Plan:  75 year old man with:     1.   Left renal pelvis tumor diagnosed in 2021.  The developed stage IV high-grade urothelial carcinoma with hepatic and bone involvement.   He is currently on systemic therapy utilizing Pembrolizumab without any major complications.  Risks and benefits of continuing this treatment were discussed at this time.  I recommended updating his staging scans before the next visit to determine the extent of his response.  Alternative treatment options including chemotherapy, antibiotic drug conjugate such as Padcev among other options.     2.  IV access: Port-A-Cath currently in use without any issues.    3.  Renal function surveillance: His creatinine clearance continues to be close to 50 cc/min.    4.  Anemia: Multifactorial in nature related to mild renal sufficiency, malignancy, surgery and chemotherapy.  Hemoglobin is adequate at this time.  Iron studies suggest chronic disease and malignancy related.  5.  Lytic lesion of the humerus: He is currently receiving radiation therapy after surgical  fixation without any issues.  6.  Immune mediated complications.  He has not experienced any immune mediated issues including pneumonitis, colitis and thyroid disease.  We will continue to educate him about potential issues.  7.  Follow-up: In 6 weeks for the next cycle of therapy.     30  minutes were spent on this visit.  The time was dedicated to reviewing laboratory data, disease status update and addressing complications related to cancer and cancer therapy.       [name], MD 8/26/202211:41 AM

## 2020-12-13 NOTE — Patient Instructions (Signed)
[facility]   Discharge Instructions: Thank you for choosing [facility] to provide your oncology and hematology care.   If you have a lab appointment with the Cancer Center, please go directly to the Cancer Center and check in at the registration area.   Wear comfortable clothing and clothing appropriate for easy access to any Portacath or PICC line.   We strive to give you quality time with your provider. You may need to reschedule your appointment if you arrive late (15 or more minutes).  Arriving late affects you and other patients whose appointments are after yours.  Also, if you miss three or more appointments without notifying the office, you may be dismissed from the clinic at the provider's discretion.      For prescription refill requests, have your pharmacy contact our office and allow 72 hours for refills to be completed.    Today you received the following chemotherapy and/or immunotherapy agents: Pembrolizumab.      To help prevent nausea and vomiting after your treatment, we encourage you to take your nausea medication as directed.  BELOW ARE SYMPTOMS THAT SHOULD BE REPORTED IMMEDIATELY: *FEVER GREATER THAN 100.4 F (38 C) OR HIGHER *CHILLS OR SWEATING *NAUSEA AND VOMITING THAT IS NOT CONTROLLED WITH YOUR NAUSEA MEDICATION *UNUSUAL SHORTNESS OF BREATH *UNUSUAL BRUISING OR BLEEDING *URINARY PROBLEMS (pain or burning when urinating, or frequent urination) *BOWEL PROBLEMS (unusual diarrhea, constipation, pain near the anus) TENDERNESS IN MOUTH AND THROAT WITH OR WITHOUT PRESENCE OF ULCERS (sore throat, sores in mouth, or a toothache) UNUSUAL RASH, SWELLING OR PAIN  UNUSUAL VAGINAL DISCHARGE OR ITCHING   Items with * indicate a potential emergency and should be followed up as soon as possible or go to the Emergency Department if any problems should occur.  Please show the CHEMOTHERAPY ALERT CARD or IMMUNOTHERAPY ALERT CARD at  check-in to the Emergency Department and triage nurse.  Should you have questions after your visit or need to cancel or reschedule your appointment, please contact [facility]  Dept: [phone]  and follow the prompts.  Office hours are 8:00 a.m. to 4:30 p.m. Monday - Friday. Please note that voicemails left after 4:00 p.m. may not be returned until the following business day.  We are closed weekends and major holidays. You have access to a nurse at all times for urgent questions. Please call the main number to the clinic Dept: [phone] and follow the prompts.   For any non-urgent questions, you may also contact your provider using MyChart. We now offer e-Visits for anyone 18 and older to request care online for non-urgent symptoms. For details visit [email or web address].   Also download the MyChart app! Go to the app store, search "MyChart", open the app, select [facility], and log in with your MyChart username and password.  Due to Covid, a mask is required upon entering the hospital/clinic. If you do not have a mask, one will be given to you upon arrival. For doctor visits, patients may have 1 support person aged 18 or older with them. For treatment visits, patients cannot have anyone with them due to current Covid guidelines and our immunocompromised population.   

## 2020-12-16 ENCOUNTER — Other Ambulatory Visit: Payer: Self-pay

## 2020-12-16 ENCOUNTER — Ambulatory Visit
Admission: RE | Admit: 2020-12-16 | Discharge: 2020-12-16 | Disposition: A | Discharge status: Home / Self Care | Payer: PPO | Source: Ambulatory Visit | Attending: Radiation Oncology | Admitting: Radiation Oncology

## 2020-12-16 DIAGNOSIS — Z510 Encounter for antineoplastic radiation therapy: Secondary | ICD-10-CM | POA: Diagnosis not present

## 2020-12-17 ENCOUNTER — Telehealth: Payer: Self-pay | Admitting: *Deleted

## 2020-12-17 ENCOUNTER — Ambulatory Visit
Admission: RE | Admit: 2020-12-17 | Discharge: 2020-12-17 | Disposition: A | Discharge status: Home / Self Care | Payer: PPO | Source: Ambulatory Visit | Attending: Radiation Oncology | Admitting: Radiation Oncology

## 2020-12-17 DIAGNOSIS — Z510 Encounter for antineoplastic radiation therapy: Secondary | ICD-10-CM | POA: Diagnosis not present

## 2020-12-17 NOTE — Telephone Encounter (Signed)
Patient called and requested to have a Port flush appt added to his lab appt on 01/24/21. He said his port needed flushing and he would rather have blood drawn from his port than his arm. Schedule message sent

## 2020-12-18 ENCOUNTER — Ambulatory Visit
Admission: RE | Admit: 2020-12-18 | Discharge: 2020-12-18 | Disposition: A | Discharge status: Home / Self Care | Payer: PPO | Source: Ambulatory Visit | Attending: Radiation Oncology | Admitting: Radiation Oncology

## 2020-12-18 ENCOUNTER — Other Ambulatory Visit: Payer: Self-pay

## 2020-12-18 DIAGNOSIS — Z510 Encounter for antineoplastic radiation therapy: Secondary | ICD-10-CM | POA: Diagnosis not present

## 2020-12-19 ENCOUNTER — Ambulatory Visit
Admission: RE | Admit: 2020-12-19 | Discharge: 2020-12-19 | Disposition: A | Discharge status: Home / Self Care | Payer: PPO | Source: Ambulatory Visit | Attending: Radiation Oncology | Admitting: Radiation Oncology

## 2020-12-19 DIAGNOSIS — C659 Malignant neoplasm of unspecified renal pelvis: Secondary | ICD-10-CM | POA: Insufficient documentation

## 2020-12-19 DIAGNOSIS — C7951 Secondary malignant neoplasm of bone: Secondary | ICD-10-CM | POA: Insufficient documentation

## 2020-12-19 DIAGNOSIS — Z510 Encounter for antineoplastic radiation therapy: Secondary | ICD-10-CM | POA: Insufficient documentation

## 2020-12-20 ENCOUNTER — Other Ambulatory Visit: Payer: Self-pay

## 2020-12-20 ENCOUNTER — Encounter: Payer: Self-pay | Admitting: Urology

## 2020-12-20 ENCOUNTER — Ambulatory Visit
Admission: RE | Admit: 2020-12-20 | Discharge: 2020-12-20 | Disposition: A | Discharge status: Home / Self Care | Payer: PPO | Source: Ambulatory Visit | Attending: Radiation Oncology | Admitting: Radiation Oncology

## 2020-12-20 DIAGNOSIS — C7951 Secondary malignant neoplasm of bone: Secondary | ICD-10-CM

## 2020-12-20 DIAGNOSIS — Z510 Encounter for antineoplastic radiation therapy: Secondary | ICD-10-CM | POA: Diagnosis not present

## 2020-12-20 DIAGNOSIS — C679 Malignant neoplasm of bladder, unspecified: Secondary | ICD-10-CM | POA: Diagnosis not present

## 2021-01-08 ENCOUNTER — Telehealth: Payer: Self-pay | Admitting: Pharmacist

## 2021-01-08 NOTE — Chronic Care Management (AMB) (Signed)
Chronic Care Management Pharmacy Assistant   Name: Lawrence Hall  MRN: [id number] DOB: January 15, 1946  Reason for Encounter: Chart prep for initial visit with Lawrence Hall the Clinical Pharmacist on    Conditions to be addressed/monitored: CAD, HTN, HLD, and Arthritis and Asthma   Recent office visits:  It has been over a year since last AWV.   11/22/2020 Lawrence Hall (PCP) - Patient was seen for weight loss and other issues. Discontinued Biotin. No follow up noted. Follow up in 6 months  07/24/2020 Lawrence Hall (PCP) - Patient was seen for preventative health care and additional issues. No medication changes. No follow up noted.  Recent consult visits:  12/13/2020 [facility] Hall (oncology) - Patient was seen for Malignant tumor of renal pelvis left. No medication changes. Follow up in 6 weeks.  11/25/2020 Lawrence Hall [facility] (Orthopedics) - Patient was seen for cancer metastatic to bone. No medication changes. Follow up in 3 months.  10/25/2020 Lawrence Hall (oncology) - Patient was seen for Malignant tumor of renal pelvis left. No medication changes. Follow up in 6 weeks.  10/22/2020 Lawrence Hall [facility] (Orthopedics) - Patient was seen post operative state. No mediations changes. Follow up in 5 weeks.  09/19/2020 [facility] (radiology) -  Patient was seen for cancer, metastatic to bone. No medication changes. No follow up noted.  09/06/2020 Lawrence Hall (oncology) - Patient was seen for Malignant neoplasm of urinary bladder and additional issue.  No medication changes. No follow up noted.   08/21/2020 Lawrence Saas DO (Sport Medicine) - Patient was seen for Chronic right shoulder pain and 1 additional issue.  No medication changes. Follow up after imaging. Hospital visits:  10/01/2020 Lawrence Hall(General Surgery) Admission to [facility] Medical Surgical Unit. Patient was admitted for surgery Right arm mass. Patient was discharged  10/04/2020.  Medications: Outpatient Encounter Medications as of 01/08/2021  Medication Sig Note   acetaminophen (TYLENOL) 500 MG tablet Take 500 mg by mouth as needed for moderate pain.    amLODipine (NORVASC) 2.5 MG tablet TAKE 1 TABLET ONCE DAILY.    aspirin EC 81 MG tablet Take 1 tablet (81 mg total) by mouth daily. Swallow whole.    atorvastatin (LIPITOR) 80 MG tablet TAKE 1 TABLET ONCE DAILY AT 6 PM.    cholecalciferol (VITAMIN D3) 25 MCG (1000 UNIT) tablet Take 1,000 Units by mouth daily.    metoprolol succinate (TOPROL-XL) 50 MG 24 hr tablet Take 1 tablet (50 mg total) by mouth at bedtime.    Milk Thistle 1000 MG CAPS Take 1 capsule by mouth daily.    Multiple Vitamins-Minerals (IMMUNE SUPPORT VITAMIN C PO) Take by mouth.    nitroGLYCERIN (NITROSTAT) 0.4 MG SL tablet 1 TAB UNDER TONGUE AS NEEDED FOR CHEST PAIN. MAY REPEAT EVERY 5 MIN FOR A TOTAL OF 3 DOSES. 08/21/2019: Never used   VITAMIN E PO Take 500 mg by mouth.    zinc gluconate 50 MG tablet Take 50 mg by mouth daily.    No facility-administered encounter medications on file as of 01/08/2021.   Fill History:  amlodipine 2.5 mg tablet 10/31/2020 30   atorvastatin 80 mg tablet 10/31/2020 90   METOPROLOL SUCC ER 50 MG TAB 05/10/2020 90   Have you seen any other providers since your last visit? **yes. Patient states he has been going for radiation therapy.  Any changes in your medications or health? Yes difficulty with appetite due to radiation therapy.  Any side effects from any medications? no  Do you have an symptoms or problems not managed by your medications? no  Any concerns about your health right now? Yes, weight loss, patient states he has gone from 220 lbs down to 170 lbs since the start of all his recent health issues. He is concerned about not having an appetite.  Has your provider asked that you check blood pressure, blood sugar, or follow special diet at home? Yes, patient is checking blood pressures daily, they  are running between 110/70 - 125/70. Patients diet is chicken once every two weeks, vegetables every other day, a plate of angelhair pasta daily, fruit daily, Ensure daily and a salad twice a week.  Do you get any type of exercise on a regular basis? Yes, patients activity is minimal however, he states he tries to keep moving and gets outside everyday.  Can you think of a goal you would like to reach for your health? Patient would like to put some weight back on.  Do you have any problems getting your medications? no  Is there anything that you would like to discuss during the appointment? Patient would like to discuss his weight.  Please bring medications supplements and BP machine to appointment  Care Gaps:  AWV - message sent to Lawrence Hall to schedule Flu vaccine - due  [facility] Drugs:  Atorvastatin 80mg  - last filled 10/31/2020 90DS at [facility] [phone]

## 2021-01-13 ENCOUNTER — Ambulatory Visit: Payer: PPO

## 2021-01-13 ENCOUNTER — Telehealth: Payer: Self-pay | Admitting: Pharmacist

## 2021-01-13 NOTE — Chronic Care Management (AMB) (Signed)
      Chronic Care Management Pharmacy Assistant   Name: Lawrence Hall  MRN: [id number] DOB: 05-Apr-1946  Patient has been notified and agree's to move his appointment with Jeni Salles, Clinical Pharmacist to Friday 01/17/2021 at 11:00. Watt Climes is to move the appointment as the schedule is currently blocked.  Care Gaps:  AWV - message sent to Ramond Craver to schedule Flu vaccine - due  [facility] Drugs:  Atorvastatin 80mg  - last filled 10/31/2020 90DS at [place] [phone]

## 2021-01-16 ENCOUNTER — Encounter (HOSPITAL_COMMUNITY): Payer: Self-pay | Admitting: Oncology

## 2021-01-16 ENCOUNTER — Emergency Department (HOSPITAL_COMMUNITY)
Admission: EM | Admit: 2021-01-16 | Discharge: 2021-01-16 | Disposition: A | Discharge status: Home / Self Care | Payer: PPO | Attending: Emergency Medicine | Admitting: Emergency Medicine

## 2021-01-16 ENCOUNTER — Other Ambulatory Visit: Payer: Self-pay

## 2021-01-16 ENCOUNTER — Telehealth: Payer: Self-pay | Admitting: Cardiology

## 2021-01-16 DIAGNOSIS — R55 Syncope and collapse: Secondary | ICD-10-CM | POA: Insufficient documentation

## 2021-01-16 DIAGNOSIS — I2510 Atherosclerotic heart disease of native coronary artery without angina pectoris: Secondary | ICD-10-CM | POA: Diagnosis not present

## 2021-01-16 DIAGNOSIS — Z79899 Other long term (current) drug therapy: Secondary | ICD-10-CM | POA: Insufficient documentation

## 2021-01-16 DIAGNOSIS — Z7982 Long term (current) use of aspirin: Secondary | ICD-10-CM | POA: Insufficient documentation

## 2021-01-16 DIAGNOSIS — I10 Essential (primary) hypertension: Secondary | ICD-10-CM | POA: Insufficient documentation

## 2021-01-16 DIAGNOSIS — R634 Abnormal weight loss: Secondary | ICD-10-CM | POA: Diagnosis not present

## 2021-01-16 DIAGNOSIS — R630 Anorexia: Secondary | ICD-10-CM | POA: Diagnosis not present

## 2021-01-16 DIAGNOSIS — Z85828 Personal history of other malignant neoplasm of skin: Secondary | ICD-10-CM | POA: Insufficient documentation

## 2021-01-16 DIAGNOSIS — Z85830 Personal history of malignant neoplasm of bone: Secondary | ICD-10-CM | POA: Insufficient documentation

## 2021-01-16 DIAGNOSIS — E860 Dehydration: Secondary | ICD-10-CM

## 2021-01-16 DIAGNOSIS — Z8546 Personal history of malignant neoplasm of prostate: Secondary | ICD-10-CM | POA: Diagnosis not present

## 2021-01-16 DIAGNOSIS — Z955 Presence of coronary angioplasty implant and graft: Secondary | ICD-10-CM | POA: Insufficient documentation

## 2021-01-16 DIAGNOSIS — Z85528 Personal history of other malignant neoplasm of kidney: Secondary | ICD-10-CM | POA: Diagnosis not present

## 2021-01-16 DIAGNOSIS — J45909 Unspecified asthma, uncomplicated: Secondary | ICD-10-CM | POA: Diagnosis not present

## 2021-01-16 LAB — COMPREHENSIVE METABOLIC PANEL
ALT: 45 U/L — ABNORMAL HIGH (ref 0–44)
AST: 44 U/L — ABNORMAL HIGH (ref 15–41)
Albumin: 3.2 g/dL — ABNORMAL LOW (ref 3.5–5.0)
Alkaline Phosphatase: 99 U/L (ref 38–126)
Anion gap: 6 (ref 5–15)
BUN: 28 mg/dL — ABNORMAL HIGH (ref 8–23)
CO2: 25 mmol/L (ref 22–32)
Calcium: 8.9 mg/dL (ref 8.9–10.3)
Chloride: 107 mmol/L (ref 98–111)
Creatinine, Ser: 1.21 mg/dL (ref 0.61–1.24)
GFR, Estimated: >60 mL/min (ref >60)
Glucose, Bld: 108 mg/dL — ABNORMAL HIGH (ref 70–99)
Potassium: 4.1 mmol/L (ref 3.5–5.1)
Sodium: 138 mmol/L (ref 135–145)
Total Bilirubin: 1.2 mg/dL (ref 0.3–1.2)
Total Protein: 7.3 g/dL (ref 6.5–8.1)

## 2021-01-16 LAB — CBC WITH DIFFERENTIAL/PLATELET
Abs Immature Granulocytes: 0.05 10*3/uL (ref 0.00–0.07)
Basophils Absolute: 0 10*3/uL (ref 0.0–0.1)
Basophils Relative: 1 %
Eosinophils Absolute: 0.1 10*3/uL (ref 0.0–0.5)
Eosinophils Relative: 2 %
HCT: 26.8 % — ABNORMAL LOW (ref 39.0–52.0)
Hemoglobin: 8.5 g/dL — ABNORMAL LOW (ref 13.0–17.0)
Immature Granulocytes: 1 %
Lymphocytes Relative: 10 %
Lymphs Abs: 0.6 10*3/uL — ABNORMAL LOW (ref 0.7–4.0)
MCH: 26.5 pg (ref 26.0–34.0)
MCHC: 31.7 g/dL (ref 30.0–36.0)
MCV: 83.5 fL (ref 80.0–100.0)
Monocytes Absolute: 0.6 10*3/uL (ref 0.1–1.0)
Monocytes Relative: 9 %
Neutro Abs: 5 10*3/uL (ref 1.7–7.7)
Neutrophils Relative %: 77 %
Platelets: 239 10*3/uL (ref 150–400)
RBC: 3.21 MIL/uL — ABNORMAL LOW (ref 4.22–5.81)
RDW: 16.8 % — ABNORMAL HIGH (ref 11.5–15.5)
WBC: 6.4 10*3/uL (ref 4.0–10.5)
nRBC: 0 % (ref 0.0–0.2)

## 2021-01-16 MED ORDER — LACTATED RINGERS IV BOLUS
1000.0000 mL | Freq: Once | INTRAVENOUS | Status: AC
  Administered 2021-01-16: 1000 mL via INTRAVENOUS

## 2021-01-16 MED ORDER — LACTATED RINGERS IV SOLN: INTRAVENOUS | Status: DC

## 2021-01-16 MED ORDER — HEPARIN SOD (PORK) LOCK FLUSH 100 UNIT/ML IV SOLN
500.0000 [IU] | Freq: Once | INTRAVENOUS | Status: AC
  Administered 2021-01-16: 500 [IU]
  Filled 2021-01-16: qty 5

## 2021-01-16 NOTE — Telephone Encounter (Signed)
Patient called to say that he is going to ER at the cancer because its either kidney failure or dehydration. And would like for the nurse to give him call back

## 2021-01-16 NOTE — ED Triage Notes (Signed)
Pt presents d/t multiple c/o. Per pt he has lost 10-15 lbs this month, poor PO intake, indigestion, concerns over liver enzymes and near syncopal episodes. Reports last episode was this morning.  Pt would like to have labs drawn to rule out dehydration as well as find out what his kidney function are. Last CA treatment 12/20/20.

## 2021-01-16 NOTE — ED Provider Notes (Signed)
[facility] DEPT Provider Note   CSN: [id number] Arrival date & time: 01/16/21  1304     History Chief Complaint  Patient presents with   Near Syncope    Lawrence Hall is a 75 y.o. male.  75 year old male presents for near syncope times several days.  Patient states that over the past month he has had 10 to 15 pound weight loss and has had decreased oral intake.  Denies any emesis currently.  No black or bloody stools.  No abdominal discomfort.  Patient does have a history of renal cell cancer, status post nephrectomy, with mets to his humerus.  Is very concerned about his liver function as well as his kidney function.  States his urine has been yellow.      Past Medical History:  Diagnosis Date   Arthritis    mild hands   Asthma    pt denies 4/21    Chest pain 07/25/2012   Coronary artery disease    Dysrhythmia    PAC'S, hx of atrial fib after OHS 04/2018   ED (erectile dysfunction)    H/O hiatal hernia    Heart murmur    Hypercholesterolemia    Hypertension    MARGINALLY HIGH - NO MEDS -MONITORS   Metastatic cancer to bone ([place]) dx'd 07/2020   rt humerus   Myocardial infarction ([place])    2017    Pneumonia    hx of 2018    PONV (postoperative nausea and vomiting)    Prostate cancer ([place]) 03/27/2011   prostate bx=adenocarcinoma,   Skin cancer 2008   basal cell face /removed   Sleep apnea    borderline sleep apnea per patient no devices    Urothelial cancer ([place]) dx'd 05/2019   left renal ca    Patient Active Problem List   Diagnosis Date Noted   Metastasis to bone ([place]) 10/30/2020   Goals of care, counseling/discussion 09/06/2020   Malignant tumor of renal pelvis, left ([place]) 01/04/2020   Port-A-Cath in place 11/02/2019   Right rotator cuff tear 09/19/2019   History of aortic valve replacement with bioprosthetic valve 05/18/2018   Postoperative atrial fibrillation ([place]) 05/18/2018   LBBB (left bundle branch block) 05/18/2018   S/P  CABG x 3 04/29/2018   CAD (coronary artery disease), native coronary artery 04/26/2018   Near syncope    Nonrheumatic aortic valve stenosis    Non-STEMI (non-ST elevated myocardial infarction) ([place])    Essential hypertension 06/01/2015   Asthma 05/31/2015   Elevated troponin 05/31/2015   Arthritis    Hyperlipidemia    ED (erectile dysfunction)     Past Surgical History:  Procedure Laterality Date   AORTIC VALVE REPLACEMENT N/A 04/29/2018   Procedure: AORTIC VALVE REPLACEMENT (AVR) using INSPIRIS RESILIA  AORTIC VALVE 1mm;  Surgeon: Ivin Poot, MD;  Location: [facility];  Service: Open Heart Surgery;  Laterality: N/A;   basal cell cancer  2008   inside nose   CARDIAC CATHETERIZATION N/A 06/03/2015   Procedure: Left Heart Cath and Coronary Angiography;  Surgeon: [name] Sine, MD;  Location: [place] [facility];  Service: Cardiovascular;  Laterality: N/A;   CATARACT EXTRACTION, BILATERAL  2007   CORONARY ARTERY BYPASS GRAFT N/A 04/29/2018   Procedure: CORONARY ARTERY BYPASS GRAFTING (CABG) X 3; Using Left Internal Mammary Artery (LIMA) and Right Leg Greater Saphenous Vein Graft (SVG);  LIMA to LAD, SVG to OM1, SVG to RCA,;  Surgeon: Ivin Poot, MD;  [facility]  OR;  Service: Open Heart Surgery;  Laterality: N/A;   CYSTOSCOPY/RETROGRADE/URETEROSCOPY Left 08/21/2019   Procedure: CYSTOSCOPY/RETROGRADE/URETEROSCOPY/  LEFT BIOPSY/BRUSH BIOPSY/RENAL WASHINGS/ AND STENT PLACEMENT;  Surgeon: Raynelle Bring, MD;  Location[facility];  Service: Urology;  Laterality: Left;   IR IMAGING GUIDED PORT INSERTION  09/28/2019   RIGHT/LEFT HEART CATH AND CORONARY ANGIOGRAPHY N/A 04/26/2018   Procedure: RIGHT/LEFT HEART CATH AND CORONARY ANGIOGRAPHY;  Surgeon: [name] Sine, MD;  Location: [place] [facility];  Service: Cardiovascular;  Laterality: N/A;   ROBOT ASSISTED LAPAROSCOPIC RADICAL PROSTATECTOMY  08/10/2011   Procedure: ROBOTIC ASSISTED LAPAROSCOPIC RADICAL PROSTATECTOMY LEVEL 2;  Surgeon: Dutch Gray,  MD;  Location: [facility];  Service: Urology;  Laterality: N/A;   ROBOT ASSITED LAPAROSCOPIC NEPHROURETERECTOMY Left 01/04/2020   Procedure: XI ROBOT ASSITED LAPAROSCOPIC NEPHROURETERECTOMY;  Surgeon: Raynelle Bring, MD;  Location: [facility];  Service: Urology;  Laterality: Left;  NEEDS 240 MIN FOR PROCEDURE   TEE WITHOUT CARDIOVERSION N/A 04/29/2018   Procedure: TRANSESOPHAGEAL ECHOCARDIOGRAM (TEE);  Surgeon: Prescott Gum, Collier Salina, MD;  Location: [place];  Service: Open Heart Surgery;  Laterality: N/A;   THYROID SURGERY  infant   uncertain details; no thyroid replacement listed   WISDOM TOOTH EXTRACTION         Family History  Problem Relation Age of Onset   Prostate cancer Father 58       in his 70's/other comorbidities/79 deceased   Alcohol abuse Father    Liver disease Father    Heart attack Father 5   Breast cancer Mother 82       mets to bone,deceased age 15   Cervical cancer Sister    Cervical cancer Sister    Breast cancer Sister    CAD Brother        Died of sudden cardiac death/MI    Social History   Tobacco Use   Smoking status: Never   Smokeless tobacco: Never  Vaping Use   Vaping Use: Never used  Substance Use Topics   Alcohol use: Not Currently   Drug use: No    Home Medications Prior to Admission medications   Medication Sig Start Date End Date Taking? Authorizing Provider  acetaminophen (TYLENOL) 500 MG tablet Take 500 mg by mouth as needed for moderate pain.    [provider]  amLODipine (NORVASC) 2.5 MG tablet TAKE 1 TABLET ONCE DAILY. 05/15/20   [name], Mihai, MD  aspirin EC 81 MG tablet Take 1 tablet (81 mg total) by mouth daily. Swallow whole. 01/24/20   [name], [name], MD  atorvastatin (LIPITOR) 80 MG tablet TAKE 1 TABLET ONCE DAILY AT 6 PM. 07/25/20   [name], Mihai, MD  cholecalciferol (VITAMIN D3) 25 MCG (1000 UNIT) tablet Take 1,000 Units by mouth daily.    [provider]  metoprolol succinate (TOPROL-XL) 50 MG 24 hr tablet Take 1  tablet (50 mg total) by mouth at bedtime. 02/06/20   [name], Mihai, MD  Milk Thistle 1000 MG CAPS Take 1 capsule by mouth daily.    [provider]  Multiple Vitamins-Minerals (IMMUNE SUPPORT VITAMIN C PO) Take by mouth.    [provider]  nitroGLYCERIN (NITROSTAT) 0.4 MG SL tablet 1 TAB UNDER TONGUE AS NEEDED FOR CHEST PAIN. MAY REPEAT EVERY 5 MIN FOR A TOTAL OF 3 DOSES. 06/21/19   [name], Mihai, MD  VITAMIN E PO Take 500 mg by mouth.    [provider]  zinc gluconate 50 MG tablet Take 50 mg by mouth daily.  [provider]    Allergies    Other  Review of Systems   Review of Systems  All other systems reviewed and are negative.  Physical Exam Updated Vital Signs BP 99/84 (BP Location: Left Arm)   Pulse 96   Temp 98.9 F (37.2 C) (Oral)   Resp 20   Ht 1.829 m (6')   Wt 76.7 kg   SpO2 100%   BMI 22.92 kg/m   Physical Exam Vitals and nursing note reviewed.  Constitutional:      General: He is not in acute distress.    Appearance: Normal appearance. He is well-developed. He is not toxic-appearing.  HENT:     Head: Normocephalic and atraumatic.  Eyes:     General: Lids are normal.     Conjunctiva/sclera: Conjunctivae normal.     Pupils: Pupils are equal, round, and reactive to light.  Neck:     Thyroid: No thyroid mass.     Trachea: No tracheal deviation.  Cardiovascular:     Rate and Rhythm: Normal rate and regular rhythm.     Heart sounds: Normal heart sounds. No murmur heard.   No gallop.  Pulmonary:     Effort: Pulmonary effort is normal. No respiratory distress.     Breath sounds: Normal breath sounds. No stridor. No decreased breath sounds, wheezing, rhonchi or rales.  Abdominal:     General: There is no distension.     Palpations: Abdomen is soft.     Tenderness: There is no abdominal tenderness. There is no rebound.  Musculoskeletal:        General: No tenderness. Normal range of motion.     Cervical back: Normal  range of motion and neck supple.  Skin:    General: Skin is warm and dry.     Findings: No abrasion or rash.  Neurological:     Mental Status: He is alert and oriented to person, place, and time. Mental status is at baseline.     GCS: GCS eye subscore is 4. GCS verbal subscore is 5. GCS motor subscore is 6.     Cranial Nerves: Cranial nerves are intact. No cranial nerve deficit.     Sensory: No sensory deficit.     Motor: Motor function is intact.  Psychiatric:        Attention and Perception: Attention normal.        Speech: Speech normal.        Behavior: Behavior normal.    ED Results / Procedures / Treatments   Labs (all labs ordered are listed, but only abnormal results are displayed) Labs Reviewed  CBC WITH DIFFERENTIAL/PLATELET  COMPREHENSIVE METABOLIC PANEL    EKG None  Radiology No results found.  Procedures Procedures   Medications Ordered in ED Medications  lactated ringers infusion (has no administration in time range)  lactated ringers bolus 1,000 mL (has no administration in time range)    ED Course  I have reviewed the triage vital signs and the nursing notes.  Pertinent labs & imaging results that were available during my care of the patient were reviewed by me and considered in my medical decision making (see chart for details).    MDM Rules/Calculators/A&P                           Patient given IV hydration here.  Labs overall reassuring.  We will follow-up in the cancer center Final Clinical Impression(s) / ED Diagnoses  Final diagnoses:  None    Rx / DC Orders ED Discharge Orders     None        Lacretia Leigh, MD 01/16/21 1620

## 2021-01-16 NOTE — Telephone Encounter (Signed)
Pt state he previously underwent cancer treatment and feel like he is significantly declining. He report his appetite has decreased, he is down 10 lbs, has no energy, dehydrated, and overall feels crappy. Pt state he wanted to make Dr. Loletha Grayer aware that he was going to [facility] ER for further evaluations because he suspect he is either severely dehydrated or in kidney failure.

## 2021-01-16 NOTE — Progress Notes (Deleted)
Chronic Care Management Pharmacy Note  01/16/2021 Name:  Lawrence Hall MRN:  [id number] DOB:  February 11, 1946  Summary: ***  Recommendations/Changes made from today's visit: ***  Plan: ***   Subjective: Lawrence Hall is an 75 y.o. year old male who is a primary patient of [name], [name], MD.  The CCM team was consulted for assistance with disease management and care coordination needs.    Engaged with patient face to face for initial visit in response to provider referral for pharmacy case management and/or care coordination services.   Consent to Services:  The patient was given the following information about Chronic Care Management services today, agreed to services, and gave verbal consent: 1. CCM service includes personalized support from designated clinical staff supervised by the primary care provider, including individualized plan of care and coordination with other care providers 2. 24/7 contact phone numbers for assistance for urgent and routine care needs. 3. Service will only be billed when office clinical staff spend 20 minutes or more in a month to coordinate care. 4. Only one practitioner may furnish and bill the service in a calendar month. 5.The patient may stop CCM services at any time (effective at the end of the month) by phone call to the office staff. 6. The patient will be responsible for cost sharing (co-pay) of up to 20% of the service fee (after annual deductible is met). Patient agreed to services and consent obtained.  Patient Care Team: [name], MD as PCP - General (Family Medicine) [name], Dani Gobble, MD as PCP - Cardiology (Cardiology) Raynelle Bring, MD as Consulting Physician (Urology) Viona Gilmore, [facility] as Pharmacist (Pharmacist)  Recent office visits: 11/22/2020 [name] (PCP) - Patient was seen for weight loss and other issues. Discontinued Biotin. No follow up noted. Follow up in 6 months   07/24/2020 [name]  (PCP) - Patient was seen for preventative health care and additional issues. No medication changes. No follow up noted.  Recent consult visits: 12/13/2020 [name] (oncology) - Patient was seen for Malignant tumor of renal pelvis left. No medication changes. Follow up in 6 weeks.   11/25/2020 [name] [facility] (Orthopedics) - Patient was seen for cancer metastatic to bone. No medication changes. Follow up in 3 months.   10/25/2020 [name] (oncology) - Patient was seen for Malignant tumor of renal pelvis left. No medication changes. Follow up in 6 weeks.   10/22/2020 [name] [facility] (Orthopedics) - Patient was seen post operative state. No mediations changes. Follow up in 5 weeks.   09/19/2020 [name] (radiology) -  Patient was seen for cancer, metastatic to bone. No medication changes. No follow up noted.   09/06/2020 [name] (oncology) - Patient was seen for Malignant neoplasm of urinary bladder and additional issue.  No medication changes. No follow up noted.    08/21/2020 [name] (Sport Medicine) - Patient was seen for Chronic right shoulder pain and 1 additional issue.  No medication changes. Follow up after imaging.  Hospital visits: 10/01/2020 [name](General Surgery) Admission to [facility] Medical Surgical Unit. Patient was admitted for surgery Right arm mass. Patient was discharged 10/04/2020.   Objective:  Lab Results  Component Value Date   CREATININE 1.21 01/16/2021   BUN 28 (H) 01/16/2021   GFR 49.84 (L) 11/22/2020   GFRNONAA >60 01/16/2021   GFRAA 43 (L) 01/05/2020   NA 138 01/16/2021   K 4.1 01/16/2021  CALCIUM 8.9 01/16/2021   CO2 25 01/16/2021   GLUCOSE 108 (H) 01/16/2021    Lab Results  Component Value Date/Time   HGBA1C 5.4 04/28/2018 03:48 AM   GFR 49.84 (L) 11/22/2020 11:21 AM    Last diabetic Eye exam: No results found for: HMDIABEYEEXA  Last diabetic Foot exam: No results found for:  HMDIABFOOTEX   Lab Results  Component Value Date   CHOL 136 04/07/2019   HDL 42 04/07/2019   LDLCALC 71 04/07/2019   TRIG 127 04/07/2019   CHOLHDL 3.2 04/07/2019    Hepatic Function Latest Ref Rng & Units 01/16/2021 12/13/2020 11/22/2020  Total Protein 6.5 - 8.1 g/dL 7.3 7.5 7.2  Albumin 3.5 - 5.0 g/dL 3.2(L) 3.2(L) 3.8  AST 15 - 41 U/L 44(H) 26 24  ALT 0 - 44 U/L 45(H) 22 19  Alk Phosphatase 38 - 126 U/L 99 115 107  Total Bilirubin 0.3 - 1.2 mg/dL 1.2 1.0 0.8  Bilirubin, Direct 0.0 - 0.2 mg/dL - - -    Lab Results  Component Value Date/Time   TSH 1.844 12/13/2020 12:13 PM   TSH 2.70 11/22/2020 11:21 AM   TSH 1.954 11/01/2020 11:15 AM   TSH 4.120 05/30/2018 10:44 AM   FREET4 0.74 (L) 04/28/2018 03:48 AM    CBC Latest Ref Rng & Units 01/16/2021 12/13/2020 11/22/2020  WBC 4.0 - 10.5 K/uL 6.4 7.4 7.0  Hemoglobin 13.0 - 17.0 g/dL 8.5(L) 9.1(L) 10.2(L)  Hematocrit 39.0 - 52.0 % 26.8(L) 28.1(L) 30.4(L)  Platelets 150 - 400 K/uL 239 240 304.0    Lab Results  Component Value Date/Time   VD25OH 45.09 11/22/2020 11:21 AM    Clinical ASCVD: Yes  The ASCVD Risk score (Arnett DK, et al., 2019) failed to calculate for the following reasons:   The patient has a prior MI or stroke diagnosis    Depression screen [facility] 2/9 07/24/2020 05/09/2020 01/24/2020  Decreased Interest 0 0 0  Down, Depressed, Hopeless 0 0 0  PHQ - 2 Score 0 0 0  Altered sleeping 1 0 -  Tired, decreased energy 1 0 -  Change in appetite 0 0 -  Feeling bad or failure about yourself  1 0 -  Trouble concentrating 0 0 -  Moving slowly or fidgety/restless 0 0 -  Suicidal thoughts 0 0 -  PHQ-9 Score 3 0 -  Difficult doing work/chores - Not difficult at all -  Some recent data might be hidden     Social History   Tobacco Use  Smoking Status Never  Smokeless Tobacco Never   BP Readings from Last 3 Encounters:  01/16/21 120/64  12/13/20 121/69  11/22/20 (!) 108/58   Pulse Readings from Last 3 Encounters:   01/16/21 69  12/13/20 84  11/22/20 76   Wt Readings from Last 3 Encounters:  01/16/21 169 lb (76.7 kg)  12/13/20 179 lb 8 oz (81.4 kg)  11/22/20 184 lb 9.6 oz (83.7 kg)   BMI Readings from Last 3 Encounters:  01/16/21 22.92 kg/m  12/13/20 24.34 kg/m  11/22/20 25.04 kg/m    Assessment/Interventions: Review of patient past medical history, allergies, medications, health status, including review of consultants reports, laboratory and other test data, was performed as part of comprehensive evaluation and provision of chronic care management services.   SDOH:  (Social Determinants of Health) assessments and interventions performed: {yes/no:20286}  SDOH Screenings   Alcohol Screen: Low Risk    Last Alcohol Screening Score (AUDIT): 0  Depression (PHQ2-9): Low Risk  PHQ-2 Score: 3  Financial Resource Strain: Low Risk    Difficulty of Paying Living Expenses: Not hard at all  Food Insecurity: No Food Insecurity   Worried About Charity fundraiser in the Last Year: Never true   Ran Out of Food in the Last Year: Never true  Housing: Low Risk    Last Housing Risk Score: 0  Physical Activity: Inactive   Days of Exercise per Week: 0 days   Minutes of Exercise per Session: 0 min  Social Connections: Moderately Integrated   Frequency of Communication with Friends and Family: Once a week   Frequency of Social Gatherings with Friends and Family: Twice a week   Attends Religious Services: 1 to 4 times per year   Active Member of [employer] or Organizations: No   Attends Music therapist: Never   Marital Status: Married  Stress: No Stress Concern Present   Feeling of Stress : Not at all  Tobacco Use: Low Risk    Smoking Tobacco Use: Never   Smokeless Tobacco Use: Never  Transportation Needs: No Transportation Needs   Lack of Transportation (Medical): No   Lack of Transportation (Non-Medical): No   Yes, patient is checking blood pressures daily, they are running between  110/70 - 125/70. Patients diet is chicken once every two weeks, vegetables every other day, a plate of angelhair pasta daily, fruit daily, Ensure daily and a salad twice a week.  Yes, patients activity is minimal however, he states he tries to keep moving and gets outside everyday.   CCM Care Plan  Allergies  Allergen Reactions   Other Itching and Other (See Comments)    Pet dander, seasonal allergies = Runny nose, itchy eyes    Medications Reviewed Today     Reviewed by Tyler Pita, MD (Physician) on 12/03/20 at 1314  Med List Status: <None>   Medication Order Taking? Sig Documenting Provider Last Dose Status Informant  acetaminophen (TYLENOL) 500 MG tablet [id number]  Take 500 mg by mouth as needed for moderate pain. [provider]  Active Self  amLODipine (NORVASC) 2.5 MG tablet [id number]  TAKE 1 TABLET ONCE DAILY. [name], Mihai, MD  Active   aspirin EC 81 MG tablet [id number]  Take 1 tablet (81 mg total) by mouth daily. Swallow whole. [name], MD  Active   atorvastatin (LIPITOR) 80 MG tablet [id number]  TAKE 1 TABLET ONCE DAILY AT 6 PM. [name], Mihai, MD  Active   cholecalciferol (VITAMIN D3) 25 MCG (1000 UNIT) tablet [id number]  Take 1,000 Units by mouth daily. [provider]  Active   metoprolol succinate (TOPROL-XL) 50 MG 24 hr tablet [id number]  Take 1 tablet (50 mg total) by mouth at bedtime. [name], Mihai, MD  Active   Milk Thistle 1000 MG CAPS [id number]  Take 1 capsule by mouth daily. [provider]  Active   Multiple Vitamins-Minerals (IMMUNE SUPPORT VITAMIN C PO) [id number]  Take by mouth. [provider]  Active   nitroGLYCERIN (NITROSTAT) 0.4 MG SL tablet [id number]  1 TAB UNDER TONGUE AS NEEDED FOR CHEST PAIN. MAY REPEAT EVERY 5 MIN FOR A TOTAL OF 3 DOSES. [name], Dani Gobble, MD  Active            Med Note Venda Rodes Aug 21, 2019  2:31 PM) Never used  VITAMIN E PO [id number]  Take 500 mg by mouth. [provider]  Active  Patient Active Problem List   Diagnosis Date Noted   Metastasis to bone ([place]) 10/30/2020   Goals of care, counseling/discussion 09/06/2020   Malignant tumor of renal pelvis, left ([place]) 01/04/2020   Port-A-Cath in place 11/02/2019   Right rotator cuff tear 09/19/2019   History of aortic valve replacement with bioprosthetic valve 05/18/2018   Postoperative atrial fibrillation ([place]) 05/18/2018   LBBB (left bundle branch block) 05/18/2018   S/P CABG x 3 04/29/2018   CAD (coronary artery disease), native coronary artery 04/26/2018   Near syncope    Nonrheumatic aortic valve stenosis    Non-STEMI (non-ST elevated myocardial infarction) ([place])    Essential hypertension 06/01/2015   Asthma 05/31/2015   Elevated troponin 05/31/2015   Arthritis    Hyperlipidemia    ED (erectile dysfunction)     Immunization History  Administered Date(s) Administered   Fluad Quad(high Dose 65+) 01/24/2020   Influenza,inj,Quad PF,6+ Mos 06/04/2015   Moderna Sars-Covid-2 Vaccination 08/06/2020   PFIZER(Purple Top)SARS-COV-2 Vaccination 06/11/2019, 07/11/2019, 12/19/2019, 08/22/2020   Pneumococcal Conjugate-13 04/20/2016   Pneumococcal Polysaccharide-23 04/20/2017   Tdap 04/20/2018   Zoster Recombinat (Shingrix) 02/16/2020, 07/03/2020    Conditions to be addressed/monitored:  Hypertension, Hyperlipidemia, and Coronary Artery Disease  There are no care plans that you recently modified to display for this patient.   Current Barriers:  {pharmacybarriers:24917}  Pharmacist Clinical Goal(s):  Patient will {PHARMACYGOALCHOICES:24921} through collaboration with PharmD and provider.   Interventions: 1:1 collaboration with [name], MD regarding development and update of comprehensive plan of care as evidenced by provider attestation and co-signature Inter-disciplinary care team collaboration (see longitudinal plan of care) Comprehensive medication review  performed; medication list updated in electronic medical record  BP Readings from Last 3 Encounters:  01/16/21 120/64  12/13/20 121/69  11/22/20 (!) 108/58     Hypertension (BP goal <130/80) -Controlled -Current treatment: Amlodipine 2.5 mg 1 tablet daily Metoprolol succinate 50 mg 1 tablet at bedtime -Medications previously tried: ***  -Current home readings: *** -Current dietary habits: *** -Current exercise habits: *** -{ACTIONS;DENIES/REPORTS:21021675::"Denies"} hypotensive/hypertensive symptoms -Educated on {CCM BP Counseling:25124} -Counseled to monitor BP at home ***, document, and provide log at future appointments -{CCMPHARMDINTERVENTION:25122}  Lab Results  Component Value Date   CHOL 136 04/07/2019   HDL 42 04/07/2019   LDLCALC 71 04/07/2019   TRIG 127 04/07/2019   CHOLHDL 3.2 04/07/2019     Hyperlipidemia: (LDL goal < 70) -Not ideally controlled -Current treatment: Atorvastatin 80 mg 1 tablet daily -Medications previously tried: ***  -Current dietary patterns: *** -Current exercise habits: *** -Educated on {CCM HLD Counseling:25126} -{CCMPHARMDINTERVENTION:25122}  CAD (Goal: ***) -{US controlled/uncontrolled:25276} -Current treatment  Aspirin 81 mg 1 tablet daily Atorvastatin 80 mg 1 tablet daily -Medications previously tried: ***  -{CCMPHARMDINTERVENTION:25122}  Last vitamin D Lab Results  Component Value Date   VD25OH 45.09 11/22/2020   Calcium? For bone metasasis?  Health Maintenance -Vaccine gaps: *** -Current therapy:  Acetaminophen 500 mg tablet as needed Vitamin D 1000 units daily Milk thistle 1000 mg daily Multivitamin 1 tablet daily Zinc 50 mg daily Vitamin E? Can he stop -Educated on {ccm supplement counseling:25128} -{CCM Patient satisfied:25129} -{CCMPHARMDINTERVENTION:25122}  Patient Goals/Self-Care Activities Patient will:  - {pharmacypatientgoals:24919}  Follow Up Plan: {CM FOLLOW UP EHOZ:22482}   Medication  Assistance: {MEDASSISTANCEINFO:25044}  Compliance/Adherence/Medication fill history: Care Gaps: Influenza vaccine  Star-Rating Drugs: Atorvastatin 18m - [facility] Patient's preferred [facility] is:  [facility]  [place] [address] Phone: [phone] Fax: [phone]  Uses pill box? {Yes or If no, why not?:20788} Pt endorses ***% compliance  We discussed: {Pharmacy options:24294} Patient decided to: {US Pharmacy ZCHY:85027}  Care Plan and Follow Up Patient Decision:  {FOLLOWUP:24991}  Plan: {CM FOLLOW UP XAJO:87867}  Jeni Salles, PharmD, [place] Pharmacist [place] at [place]

## 2021-01-17 ENCOUNTER — Ambulatory Visit: Payer: PPO

## 2021-01-19 ENCOUNTER — Encounter: Payer: Self-pay | Admitting: Oncology

## 2021-01-20 ENCOUNTER — Telehealth: Payer: Self-pay | Admitting: Pharmacist

## 2021-01-20 ENCOUNTER — Other Ambulatory Visit: Payer: Self-pay

## 2021-01-20 ENCOUNTER — Inpatient Hospital Stay: Payer: PPO | Attending: Oncology

## 2021-01-20 ENCOUNTER — Inpatient Hospital Stay: Payer: PPO | Admitting: Oncology

## 2021-01-20 VITALS — BP 128/70 | HR 85 | Temp 98.3°F | Resp 16

## 2021-01-20 VITALS — BP 121/62 | HR 109 | Temp 98.0°F | Resp 20 | Ht 72.0 in | Wt 165.6 lb

## 2021-01-20 DIAGNOSIS — R627 Adult failure to thrive: Secondary | ICD-10-CM | POA: Diagnosis not present

## 2021-01-20 DIAGNOSIS — E860 Dehydration: Secondary | ICD-10-CM | POA: Diagnosis not present

## 2021-01-20 DIAGNOSIS — Z923 Personal history of irradiation: Secondary | ICD-10-CM | POA: Diagnosis not present

## 2021-01-20 DIAGNOSIS — C652 Malignant neoplasm of left renal pelvis: Secondary | ICD-10-CM | POA: Insufficient documentation

## 2021-01-20 DIAGNOSIS — Z5112 Encounter for antineoplastic immunotherapy: Secondary | ICD-10-CM | POA: Insufficient documentation

## 2021-01-20 DIAGNOSIS — C787 Secondary malignant neoplasm of liver and intrahepatic bile duct: Secondary | ICD-10-CM | POA: Diagnosis not present

## 2021-01-20 DIAGNOSIS — D630 Anemia in neoplastic disease: Secondary | ICD-10-CM | POA: Insufficient documentation

## 2021-01-20 DIAGNOSIS — Z7982 Long term (current) use of aspirin: Secondary | ICD-10-CM | POA: Insufficient documentation

## 2021-01-20 DIAGNOSIS — Z95828 Presence of other vascular implants and grafts: Secondary | ICD-10-CM

## 2021-01-20 DIAGNOSIS — Z79899 Other long term (current) drug therapy: Secondary | ICD-10-CM | POA: Insufficient documentation

## 2021-01-20 DIAGNOSIS — R000 Tachycardia, unspecified: Secondary | ICD-10-CM | POA: Diagnosis not present

## 2021-01-20 MED ORDER — MEGESTROL ACETATE 400 MG/10ML PO SUSP: 400.0000 mg | Freq: Every day | ORAL | 1 refills | Status: DC

## 2021-01-20 MED ORDER — HEPARIN SOD (PORK) LOCK FLUSH 100 UNIT/ML IV SOLN
500.0000 [IU] | Freq: Once | INTRAVENOUS | Status: AC | PRN
  Administered 2021-01-20: 500 [IU]

## 2021-01-20 MED ORDER — SODIUM CHLORIDE 0.9% FLUSH
10.0000 mL | Freq: Once | INTRAVENOUS | Status: AC | PRN
  Administered 2021-01-20: 10 mL

## 2021-01-20 MED ORDER — SODIUM CHLORIDE 0.9 % IV SOLN: Freq: Once | INTRAVENOUS | Status: AC

## 2021-01-20 NOTE — Progress Notes (Signed)
Hematology and Oncology Follow Up Visit  Lawrence Hall [id number] [birth year] 75 y.o. 01/20/2021 10:51 AM Lawrence Hall, MDKoberlein, [name], MD   Principle Diagnosis: 75 year old man with stage IV high-grade urothelial carcinoma with bone and hepatic involvement diagnosed in 2021.   Prior Therapy: He is status post biopsy obtained on Aug 21, 2019 which showed high-grade urothelial carcinoma of the left renal pelvis.   He status post neoadjuvant chemotherapy utilizing gemcitabine and cisplatin given on day 1 and cisplatin given on day 8 start her on 10/06/2019.   He completed 3 cycles of therapy in August 2021.  He is status post left robotic assisted laparoscopic nephroureterectomy and retroperitoneal lymphadenectomy completed on 01/04/2020.  He was found to have a residual T4 disease with 1 out of 5 lymph nodes involved.  He is status post tumor resection from his right humerus with surgical fixation and rodding completed by Dr. Lovena Le at [facility] completed in June 2022.  The final pathology confirmed the presence of metastatic urothelial carcinoma.  Radiation therapy to be right humerus.  He completed 10 fractions for a total of 30 Gray completed in July 2022.  Current therapy:   Pembrolizumab 200 mg started on Sep 13, 2020.  He is currently receiving 400 mg every 6 weeks.  He completed cycle 3 on December 13, 2020.    Interim History: Lawrence Hall returns today for a follow-up visit.  Since the last visit, he has been doing poorly overall.  He tolerated the last treatment the end of August although through the last few weeks he has noticed decrease in his appetite and failure to thrive.  He was seen in the emergency department last week and received intravenous hydration.  IV fluids helped his symptoms but he is struggling with dyspepsia, indigestion and weight loss.  He denies any headaches or blurry vision.  He denies any neurological deficits.     Medications: Reviewed without  changes. Current Outpatient Medications  Medication Sig Dispense Refill   megestrol (MEGACE) 400 MG/10ML suspension Take 10 mLs (400 mg total) by mouth daily. 240 mL 1   acetaminophen (TYLENOL) 500 MG tablet Take 500 mg by mouth as needed for moderate pain.     amLODipine (NORVASC) 2.5 MG tablet TAKE 1 TABLET ONCE DAILY. 30 tablet 6   aspirin EC 81 MG tablet Take 1 tablet (81 mg total) by mouth daily. Swallow whole. 30 tablet 11   atorvastatin (LIPITOR) 80 MG tablet TAKE 1 TABLET ONCE DAILY AT 6 PM. 90 tablet 3   cholecalciferol (VITAMIN D3) 25 MCG (1000 UNIT) tablet Take 1,000 Units by mouth daily.     metoprolol succinate (TOPROL-XL) 50 MG 24 hr tablet Take 1 tablet (50 mg total) by mouth at bedtime. 90 tablet 3   Milk Thistle 1000 MG CAPS Take 1 capsule by mouth daily.     Multiple Vitamins-Minerals (IMMUNE SUPPORT VITAMIN C PO) Take by mouth.     nitroGLYCERIN (NITROSTAT) 0.4 MG SL tablet 1 TAB UNDER TONGUE AS NEEDED FOR CHEST PAIN. MAY REPEAT EVERY 5 MIN FOR A TOTAL OF 3 DOSES. 25 tablet 4   VITAMIN E PO Take 500 mg by mouth.     zinc gluconate 50 MG tablet Take 50 mg by mouth daily.     No current facility-administered medications for this visit.     Allergies:  Allergies  Allergen Reactions   Other Itching and Other (See Comments)    Pet dander, seasonal allergies = Runny nose, itchy  eyes      Physical Exam:   Blood pressure 121/62, pulse (!) 109, temperature 98 F (36.7 C), temperature source Oral, resp. rate 20, height 6' (1.829 m), weight 165 lb 9.6 oz (75.1 kg), SpO2 100 %.    ECOG: 1    General appearance: Comfortable appearing without any discomfort Head: Normocephalic without any trauma Oropharynx: Mucous membranes are moist and pink without any thrush or ulcers. Eyes: Pupils are equal and round reactive to light. Lymph nodes: No cervical, supraclavicular, inguinal or axillary lymphadenopathy.   Heart:regular rate and rhythm.  S1 and S2 without leg  edema. Lung: Clear without any rhonchi or wheezes.  No dullness to percussion. Abdomin: Soft, nontender, nondistended with good bowel sounds.  No hepatosplenomegaly. Musculoskeletal: No joint deformity or effusion.  Full range of motion noted. Neurological: No deficits noted on motor, sensory and deep tendon reflex exam. Skin: No petechial rash or dryness.  Appeared moist.             Lab Results: Lab Results  Component Value Date   WBC 6.4 01/16/2021   HGB 8.5 (L) 01/16/2021   HCT 26.8 (L) 01/16/2021   MCV 83.5 01/16/2021   PLT 239 01/16/2021     Chemistry      Component Value Date/Time   NA 138 01/16/2021 1457   NA 137 04/07/2019 1021   K 4.1 01/16/2021 1457   CL 107 01/16/2021 1457   CO2 25 01/16/2021 1457   BUN 28 (H) 01/16/2021 1457   BUN 29 (H) 04/07/2019 1021   CREATININE 1.21 01/16/2021 1457   CREATININE 1.43 (H) 12/13/2020 1214      Component Value Date/Time   CALCIUM 8.9 01/16/2021 1457   ALKPHOS 99 01/16/2021 1457   AST 44 (H) 01/16/2021 1457   AST 26 12/13/2020 1214   ALT 45 (H) 01/16/2021 1457   ALT 22 12/13/2020 1214   BILITOT 1.2 01/16/2021 1457   BILITOT 1.0 12/13/2020 1214         Impression and Plan:  75 year old man with:     1.  Stage IV high-grade urothelial carcinoma with hepatic and bone involvement diagnosed in 2021.  His disease status was updated at this time and treatment choices were reviewed.  He is currently on Pembrolizumab and the plan is to update his staging scans this week.  Given his recent functional decline it is reasonable to consider progression of disease at this time as a possibility.  Different salvage therapy options including Padcev among others will be considered after his CT scan.     2.  IV access: Port-A-Cath remains in use and will be used for hydration today.    3.  Weight loss, dehydration and failure to thrive: Unclear etiology at this time.  This could be related to side effects of treatment versus  disease progression.  We will proceed with IV hydration today and await the results of the scan.  He might require GI consult for possible endoscopy.  A prescription for Megace was also sent to his pharmacy with instructions to take it.    4.  Anemia: Hemoglobin is adequate at this time does not require any additional transfusion.   5.  Lytic lesion of the humerus: No pain reported currently.  He is status post radiation therapy.  6.  Immune mediated complications.  No delayed complications noted at this time.  His GI symptoms could be related to gastritis and will continue to monitor.   7.  Follow-up: will be  in the next few days to follow his progress.     30  minutes were dedicated to this encounter.  The time was spent on reviewing laboratory data, disease status update, treatment choices and addressing complications related to cancer and cancer therapy.       [name], MD 10/3/202210:51 AM

## 2021-01-20 NOTE — Patient Instructions (Signed)

## 2021-01-20 NOTE — Progress Notes (Deleted)
Chronic Care Management Pharmacy Note  01/20/2021 Name:  Lawrence Hall MRN:  [id number] DOB:  1945/08/20  Summary: ***  Recommendations/Changes made from today's visit: ***  Plan: ***   Subjective: Lawrence Hall is an 75 y.o. year old male who is a primary patient of [name], [name], MD.  The CCM team was consulted for assistance with disease management and care coordination needs.    Engaged with patient face to face for initial visit in response to provider referral for pharmacy case management and/or care coordination services.   Consent to Services:  The patient was given the following information about Chronic Care Management services today, agreed to services, and gave verbal consent: 1. CCM service includes personalized support from designated clinical staff supervised by the primary care provider, including individualized plan of care and coordination with other care providers 2. 24/7 contact phone numbers for assistance for urgent and routine care needs. 3. Service will only be billed when office clinical staff spend 20 minutes or more in a month to coordinate care. 4. Only one practitioner may furnish and bill the service in a calendar month. 5.The patient may stop CCM services at any time (effective at the end of the month) by phone call to the office staff. 6. The patient will be responsible for cost sharing (co-pay) of up to 20% of the service fee (after annual deductible is met). Patient agreed to services and consent obtained.  Patient Care Team: [name], MD as PCP - General (Family Medicine) [name], Dani Gobble, MD as PCP - Cardiology (Cardiology) Raynelle Bring, MD as Consulting Physician (Urology) Viona Gilmore, [facility] as Pharmacist (Pharmacist)  Recent office visits: 11/22/2020 [name] (PCP) - Patient was seen for weight loss and other issues. Discontinued Biotin. No follow up noted. Follow up in 6 months   07/24/2020 [name]  (PCP) - Patient was seen for preventative health care and additional issues. No medication changes. No follow up noted.  Recent consult visits: 12/13/2020 [name] (oncology) - Patient was seen for Malignant tumor of renal pelvis left. No medication changes. Follow up in 6 weeks.   11/25/2020 [name] [facility] (Orthopedics) - Patient was seen for cancer metastatic to bone. No medication changes. Follow up in 3 months.   10/25/2020 [name] (oncology) - Patient was seen for Malignant tumor of renal pelvis left. No medication changes. Follow up in 6 weeks.   10/22/2020 [name] [facility] (Orthopedics) - Patient was seen post operative state. No mediations changes. Follow up in 5 weeks.   09/19/2020 [name] (radiology) -  Patient was seen for cancer, metastatic to bone. No medication changes. No follow up noted.   09/06/2020 [name] (oncology) - Patient was seen for Malignant neoplasm of urinary bladder and additional issue.  No medication changes. No follow up noted.    08/21/2020 [name] (Sport Medicine) - Patient was seen for Chronic right shoulder pain and 1 additional issue.  No medication changes. Follow up after imaging.  Hospital visits: 10/01/2020 [name](General Surgery) Admission to [facility] Medical Surgical Unit. Patient was admitted for surgery Right arm mass. Patient was discharged 10/04/2020.   Objective:  Lab Results  Component Value Date   CREATININE 1.21 01/16/2021   BUN 28 (H) 01/16/2021   GFR 49.84 (L) 11/22/2020   GFRNONAA >60 01/16/2021   GFRAA 43 (L) 01/05/2020   NA 138 01/16/2021   K 4.1 01/16/2021  CALCIUM 8.9 01/16/2021   CO2 25 01/16/2021   GLUCOSE 108 (H) 01/16/2021    Lab Results  Component Value Date/Time   HGBA1C 5.4 04/28/2018 03:48 AM   GFR 49.84 (L) 11/22/2020 11:21 AM    Last diabetic Eye exam: No results found for: HMDIABEYEEXA  Last diabetic Foot exam: No results found for:  HMDIABFOOTEX   Lab Results  Component Value Date   CHOL 136 04/07/2019   HDL 42 04/07/2019   LDLCALC 71 04/07/2019   TRIG 127 04/07/2019   CHOLHDL 3.2 04/07/2019    Hepatic Function Latest Ref Rng & Units 01/16/2021 12/13/2020 11/22/2020  Total Protein 6.5 - 8.1 g/dL 7.3 7.5 7.2  Albumin 3.5 - 5.0 g/dL 3.2(L) 3.2(L) 3.8  AST 15 - 41 U/L 44(H) 26 24  ALT 0 - 44 U/L 45(H) 22 19  Alk Phosphatase 38 - 126 U/L 99 115 107  Total Bilirubin 0.3 - 1.2 mg/dL 1.2 1.0 0.8  Bilirubin, Direct 0.0 - 0.2 mg/dL - - -    Lab Results  Component Value Date/Time   TSH 1.844 12/13/2020 12:13 PM   TSH 2.70 11/22/2020 11:21 AM   TSH 1.954 11/01/2020 11:15 AM   TSH 4.120 05/30/2018 10:44 AM   FREET4 0.74 (L) 04/28/2018 03:48 AM    CBC Latest Ref Rng & Units 01/16/2021 12/13/2020 11/22/2020  WBC 4.0 - 10.5 K/uL 6.4 7.4 7.0  Hemoglobin 13.0 - 17.0 g/dL 8.5(L) 9.1(L) 10.2(L)  Hematocrit 39.0 - 52.0 % 26.8(L) 28.1(L) 30.4(L)  Platelets 150 - 400 K/uL 239 240 304.0    Lab Results  Component Value Date/Time   VD25OH 45.09 11/22/2020 11:21 AM    Clinical ASCVD: Yes  The ASCVD Risk score (Arnett DK, et al., 2019) failed to calculate for the following reasons:   The patient has a prior MI or stroke diagnosis    Depression screen [facility] 2/9 07/24/2020 05/09/2020 01/24/2020  Decreased Interest 0 0 0  Down, Depressed, Hopeless 0 0 0  PHQ - 2 Score 0 0 0  Altered sleeping 1 0 -  Tired, decreased energy 1 0 -  Change in appetite 0 0 -  Feeling bad or failure about yourself  1 0 -  Trouble concentrating 0 0 -  Moving slowly or fidgety/restless 0 0 -  Suicidal thoughts 0 0 -  PHQ-9 Score 3 0 -  Difficult doing work/chores - Not difficult at all -  Some recent data might be hidden     Social History   Tobacco Use  Smoking Status Never  Smokeless Tobacco Never   BP Readings from Last 3 Encounters:  01/20/21 128/70  01/20/21 121/62  01/16/21 120/64   Pulse Readings from Last 3 Encounters:  01/20/21 85   01/20/21 (!) 109  01/16/21 69   Wt Readings from Last 3 Encounters:  01/20/21 165 lb 9.6 oz (75.1 kg)  01/16/21 169 lb (76.7 kg)  12/13/20 179 lb 8 oz (81.4 kg)   BMI Readings from Last 3 Encounters:  01/20/21 22.46 kg/m  01/16/21 22.92 kg/m  12/13/20 24.34 kg/m    Assessment/Interventions: Review of patient past medical history, allergies, medications, health status, including review of consultants reports, laboratory and other test data, was performed as part of comprehensive evaluation and provision of chronic care management services.   SDOH:  (Social Determinants of Health) assessments and interventions performed: {yes/no:20286}  SDOH Screenings   Alcohol Screen: Low Risk    Last Alcohol Screening Score (AUDIT): 0  Depression (PHQ2-9): Low Risk  PHQ-2 Score: 3  Financial Resource Strain: Low Risk    Difficulty of Paying Living Expenses: Not hard at all  Food Insecurity: No Food Insecurity   Worried About Charity fundraiser in the Last Year: Never true   Ran Out of Food in the Last Year: Never true  Housing: Low Risk    Last Housing Risk Score: 0  Physical Activity: Inactive   Days of Exercise per Week: 0 days   Minutes of Exercise per Session: 0 min  Social Connections: Moderately Integrated   Frequency of Communication with Friends and Family: Once a week   Frequency of Social Gatherings with Friends and Family: Twice a week   Attends Religious Services: 1 to 4 times per year   Active Member of [employer] or Organizations: No   Attends Music therapist: Never   Marital Status: Married  Stress: No Stress Concern Present   Feeling of Stress : Not at all  Tobacco Use: Low Risk    Smoking Tobacco Use: Never   Smokeless Tobacco Use: Never  Transportation Needs: No Transportation Needs   Lack of Transportation (Medical): No   Lack of Transportation (Non-Medical): No   Yes, patient is checking blood pressures daily, they are running between 110/70 -  125/70. Patients diet is chicken once every two weeks, vegetables every other day, a plate of angelhair pasta daily, fruit daily, Ensure daily and a salad twice a week.  Yes, patients activity is minimal however, he states he tries to keep moving and gets outside everyday.  Patient presented with his wife in office. Patient's biggest concern today is his weight loss and lack of appetite, which have accompanied his cancer. He has lost about 15 lbs in 3 weeks and doesn't have much of an appetite at all. He  Lost 15 lbs in 3 weeks; doesn't have much of an appetite - boost ensure, PB crackers, mashed potatoes and mac n cheese; lots of dehydration  -   Upset stomach - indigestion but not burping; stomach is too empty; got a second IV yesterday  Lots of mood swings      CCM Care Plan  Allergies  Allergen Reactions   Other Itching and Other (See Comments)    Pet dander, seasonal allergies = Runny nose, itchy eyes    Medications Reviewed Today     Reviewed by Tyler Pita, MD (Physician) on 12/03/20 at 1314  Med List Status: <None>   Medication Order Taking? Sig Documenting Provider Last Dose Status Informant  acetaminophen (TYLENOL) 500 MG tablet [id number]  Take 500 mg by mouth as needed for moderate pain. [provider]  Active Self  amLODipine (NORVASC) 2.5 MG tablet [id number]  TAKE 1 TABLET ONCE DAILY. [name], Mihai, MD  Active   aspirin EC 81 MG tablet [id number]  Take 1 tablet (81 mg total) by mouth daily. Swallow whole. [name], MD  Active   atorvastatin (LIPITOR) 80 MG tablet [id number]  TAKE 1 TABLET ONCE DAILY AT 6 PM. [name], Mihai, MD  Active   cholecalciferol (VITAMIN D3) 25 MCG (1000 UNIT) tablet [id number]  Take 1,000 Units by mouth daily. [provider]  Active   metoprolol succinate (TOPROL-XL) 50 MG 24 hr tablet [id number]  Take 1 tablet (50 mg total) by mouth at bedtime. [name], Mihai, MD  Active   Milk Thistle 1000 MG CAPS [id number]   Take 1 capsule by mouth daily. [provider]  Active   Multiple Vitamins-Minerals (IMMUNE SUPPORT VITAMIN  C PO) [id number]  Take by mouth. [provider]  Active   nitroGLYCERIN (NITROSTAT) 0.4 MG SL tablet [id number]  1 TAB UNDER TONGUE AS NEEDED FOR CHEST PAIN. MAY REPEAT EVERY 5 MIN FOR A TOTAL OF 3 DOSES. [name], Dani Gobble, MD  Active            Med Note Venda Rodes Aug 21, 2019  2:31 PM) Never used  VITAMIN E PO [id number]  Take 500 mg by mouth. [provider]  Active             Patient Active Problem List   Diagnosis Date Noted   Metastasis to bone ([place]) 10/30/2020   Goals of care, counseling/discussion 09/06/2020   Malignant tumor of renal pelvis, left ([place]) 01/04/2020   Port-A-Cath in place 11/02/2019   Right rotator cuff tear 09/19/2019   History of aortic valve replacement with bioprosthetic valve 05/18/2018   Postoperative atrial fibrillation ([place]) 05/18/2018   LBBB (left bundle branch block) 05/18/2018   S/P CABG x 3 04/29/2018   CAD (coronary artery disease), native coronary artery 04/26/2018   Near syncope    Nonrheumatic aortic valve stenosis    Non-STEMI (non-ST elevated myocardial infarction) ([place])    Essential hypertension 06/01/2015   Asthma 05/31/2015   Elevated troponin 05/31/2015   Arthritis    Hyperlipidemia    ED (erectile dysfunction)     Immunization History  Administered Date(s) Administered   Fluad Quad(high Dose 65+) 01/24/2020   Influenza,inj,Quad PF,6+ Mos 06/04/2015   Moderna Sars-Covid-2 Vaccination 08/06/2020   PFIZER(Purple Top)SARS-COV-2 Vaccination 06/11/2019, 07/11/2019, 12/19/2019, 08/22/2020   Pneumococcal Conjugate-13 04/20/2016   Pneumococcal Polysaccharide-23 04/20/2017   Tdap 04/20/2018   Zoster Recombinat (Shingrix) 02/16/2020, 07/03/2020    Conditions to be addressed/monitored:  Hypertension, Hyperlipidemia, and Coronary Artery Disease  There are no care plans that you recently  modified to display for this patient.   Current Barriers:  {pharmacybarriers:24917}  Pharmacist Clinical Goal(s):  Patient will {PHARMACYGOALCHOICES:24921} through collaboration with PharmD and provider.   Interventions: 1:1 collaboration with [name], MD regarding development and update of comprehensive plan of care as evidenced by provider attestation and co-signature Inter-disciplinary care team collaboration (see longitudinal plan of care) Comprehensive medication review performed; medication list updated in electronic medical record  BP Readings from Last 3 Encounters:  01/20/21 128/70  01/20/21 121/62  01/16/21 120/64     Hypertension (BP goal <130/80) -Controlled -Current treatment: Amlodipine 2.5 mg 1 tablet daily Metoprolol succinate 50 mg 1 tablet at bedtime -Medications previously tried: amlodipine (stopped)  -Current home readings: *** (6pm before statin) 11pm - 90s  -Current dietary habits: *** -Current exercise habits: *** -{ACTIONS;DENIES/REPORTS:21021675::"Denies"} hypotensive/hypertensive symptoms -Educated on {CCM BP Counseling:25124} -Counseled to monitor BP at home ***, document, and provide log at future appointments -{CCMPHARMDINTERVENTION:25122} -stopped the amlodipine -feels lightheaded during the day - -send a message - lot more trouble with lightheaded -fell out of the shower and busted tooth  -mirtazapine -  -wakes up about every 3 hours; feeling exhausted  Only has 1 kidney   Tums helps immediately - indigestion - every other day  -is having consitpation every 10 days - Pepcid - haven't tried anything   Lab Results  Component Value Date   CHOL 136 04/07/2019   HDL 42 04/07/2019   LDLCALC 71 04/07/2019   TRIG 127 04/07/2019   CHOLHDL 3.2 04/07/2019    Hyperlipidemia: (LDL goal < 70) -Not ideally controlled -Current treatment: Atorvastatin 80 mg 1  tablet daily -Medications previously tried: ***  -Current dietary  patterns: *** -Current exercise habits: *** -Educated on {CCM HLD Counseling:25126} -{CCMPHARMDINTERVENTION:25122}  CAD (Goal: ***) -{US controlled/uncontrolled:25276} -Current treatment  Aspirin 81 mg 1 tablet daily Atorvastatin 80 mg 1 tablet daily -Medications previously tried: ***  -{CCMPHARMDINTERVENTION:25122}  Last vitamin D Lab Results  Component Value Date   VD25OH 45.09 11/22/2020   Calcium? For bone metasasis?  -recommended stopped vitamin E; immune system tablet - vitamin C and zinc  Health Maintenance -Vaccine gaps: influenza -Current therapy:  Acetaminophen 500 mg tablet as needed Vitamin D 1000 units daily Milk thistle 1000 mg daily Multivitamin 1 tablet daily Zinc 50 mg daily Vitamin E? Can he stop -Educated on {ccm supplement counseling:25128} -{CCM Patient satisfied:25129} -{CCMPHARMDINTERVENTION:25122}  Only 20 ounces of water a day - needs to get 48 to 60 ounces  Patient Goals/Self-Care Activities Patient will:  - {pharmacypatientgoals:24919}  Follow Up Plan: {CM FOLLOW UP RCVE:93810}  Medication Assistance: {MEDASSISTANCEINFO:25044}  Compliance/Adherence/Medication fill history: Care Gaps: Influenza vaccine  Star-Rating Drugs: Atorvastatin 80m - [facility] [facility]  [place] [address] [place]Phone: [phone]: [phone] Uses pill box? Yes - mon and through Sunday - AM/PM Pt endorses 90% compliance  We discussed: {Pharmacy options:24294} [facility] and Follow Up Patient Decision:  {FOLLOWUP:24991}  Plan: {CM FOLLOW UP PQQPY:19509} MJeni Salles PharmD, BSeconsett IslandPharmacist LPlus[place] BBlanch[place]

## 2021-01-20 NOTE — Chronic Care Management (AMB) (Signed)
    Chronic Care Management Pharmacy Assistant   Name: Lawrence Hall  MRN: [id number] DOB: 1945/10/30  01/21/2021 APPOINTMENT [place], No answer, left message of appointment on 01/21/2021 at 9:00 via office visit with Jeni Salles, Pharm D. Notified to have all medications, supplements, blood pressure cuff, blood pressure and/or blood sugar logs available during appointment and to return call if need to reschedule.  Care Gaps:  AWV - message sent to Ramond Craver to schedule Flu vaccine - due   Star Rating Drug: Atorvastatin 80mg  - last filled 10/31/2020 90DS at [facility]   Any gaps in medications fill history? No  [name] [phone]

## 2021-01-21 ENCOUNTER — Ambulatory Visit (INDEPENDENT_AMBULATORY_CARE_PROVIDER_SITE_OTHER): Payer: PPO | Admitting: Pharmacist

## 2021-01-21 DIAGNOSIS — E785 Hyperlipidemia, unspecified: Secondary | ICD-10-CM

## 2021-01-21 DIAGNOSIS — I10 Essential (primary) hypertension: Secondary | ICD-10-CM

## 2021-01-23 ENCOUNTER — Emergency Department (HOSPITAL_COMMUNITY): Payer: PPO

## 2021-01-23 ENCOUNTER — Ambulatory Visit: Payer: PPO | Admitting: Oncology

## 2021-01-23 ENCOUNTER — Ambulatory Visit (HOSPITAL_COMMUNITY): Payer: PPO

## 2021-01-23 ENCOUNTER — Inpatient Hospital Stay (HOSPITAL_COMMUNITY)
Admission: EM | Admit: 2021-01-23 | Discharge: 2021-01-26 | DRG: 436 | Disposition: A | Discharge status: Home / Self Care | Payer: PPO | Attending: Internal Medicine | Admitting: Internal Medicine

## 2021-01-23 ENCOUNTER — Encounter (HOSPITAL_COMMUNITY): Payer: Self-pay | Admitting: *Deleted

## 2021-01-23 ENCOUNTER — Other Ambulatory Visit: Payer: Self-pay

## 2021-01-23 ENCOUNTER — Ambulatory Visit: Payer: PPO

## 2021-01-23 ENCOUNTER — Encounter (HOSPITAL_COMMUNITY): Payer: Self-pay

## 2021-01-23 ENCOUNTER — Other Ambulatory Visit: Payer: PPO

## 2021-01-23 DIAGNOSIS — E860 Dehydration: Secondary | ICD-10-CM | POA: Diagnosis not present

## 2021-01-23 DIAGNOSIS — G4733 Obstructive sleep apnea (adult) (pediatric): Secondary | ICD-10-CM | POA: Diagnosis not present

## 2021-01-23 DIAGNOSIS — R079 Chest pain, unspecified: Secondary | ICD-10-CM | POA: Diagnosis not present

## 2021-01-23 DIAGNOSIS — R52 Pain, unspecified: Secondary | ICD-10-CM

## 2021-01-23 DIAGNOSIS — E785 Hyperlipidemia, unspecified: Secondary | ICD-10-CM | POA: Diagnosis not present

## 2021-01-23 DIAGNOSIS — M19042 Primary osteoarthritis, left hand: Secondary | ICD-10-CM | POA: Diagnosis present

## 2021-01-23 DIAGNOSIS — Z8546 Personal history of malignant neoplasm of prostate: Secondary | ICD-10-CM | POA: Diagnosis not present

## 2021-01-23 DIAGNOSIS — I10 Essential (primary) hypertension: Secondary | ICD-10-CM | POA: Diagnosis not present

## 2021-01-23 DIAGNOSIS — Z952 Presence of prosthetic heart valve: Secondary | ICD-10-CM

## 2021-01-23 DIAGNOSIS — Z85828 Personal history of other malignant neoplasm of skin: Secondary | ICD-10-CM | POA: Diagnosis not present

## 2021-01-23 DIAGNOSIS — R634 Abnormal weight loss: Secondary | ICD-10-CM | POA: Diagnosis not present

## 2021-01-23 DIAGNOSIS — Z951 Presence of aortocoronary bypass graft: Secondary | ICD-10-CM

## 2021-01-23 DIAGNOSIS — R112 Nausea with vomiting, unspecified: Secondary | ICD-10-CM | POA: Diagnosis present

## 2021-01-23 DIAGNOSIS — C7951 Secondary malignant neoplasm of bone: Secondary | ICD-10-CM | POA: Diagnosis present

## 2021-01-23 DIAGNOSIS — J9811 Atelectasis: Secondary | ICD-10-CM | POA: Diagnosis not present

## 2021-01-23 DIAGNOSIS — D649 Anemia, unspecified: Secondary | ICD-10-CM | POA: Diagnosis present

## 2021-01-23 DIAGNOSIS — I252 Old myocardial infarction: Secondary | ICD-10-CM | POA: Diagnosis not present

## 2021-01-23 DIAGNOSIS — I2510 Atherosclerotic heart disease of native coronary artery without angina pectoris: Secondary | ICD-10-CM | POA: Diagnosis not present

## 2021-01-23 DIAGNOSIS — G893 Neoplasm related pain (acute) (chronic): Secondary | ICD-10-CM | POA: Diagnosis not present

## 2021-01-23 DIAGNOSIS — N1831 Chronic kidney disease, stage 3a: Secondary | ICD-10-CM | POA: Diagnosis not present

## 2021-01-23 DIAGNOSIS — Z85528 Personal history of other malignant neoplasm of kidney: Secondary | ICD-10-CM | POA: Diagnosis not present

## 2021-01-23 DIAGNOSIS — M19041 Primary osteoarthritis, right hand: Secondary | ICD-10-CM | POA: Diagnosis not present

## 2021-01-23 DIAGNOSIS — R109 Unspecified abdominal pain: Secondary | ICD-10-CM | POA: Diagnosis not present

## 2021-01-23 DIAGNOSIS — C652 Malignant neoplasm of left renal pelvis: Secondary | ICD-10-CM | POA: Diagnosis present

## 2021-01-23 DIAGNOSIS — Z888 Allergy status to other drugs, medicaments and biological substances status: Secondary | ICD-10-CM

## 2021-01-23 DIAGNOSIS — D630 Anemia in neoplastic disease: Secondary | ICD-10-CM | POA: Diagnosis present

## 2021-01-23 DIAGNOSIS — R627 Adult failure to thrive: Secondary | ICD-10-CM | POA: Diagnosis present

## 2021-01-23 DIAGNOSIS — Z9221 Personal history of antineoplastic chemotherapy: Secondary | ICD-10-CM

## 2021-01-23 DIAGNOSIS — I129 Hypertensive chronic kidney disease with stage 1 through stage 4 chronic kidney disease, or unspecified chronic kidney disease: Secondary | ICD-10-CM | POA: Diagnosis present

## 2021-01-23 DIAGNOSIS — C799 Secondary malignant neoplasm of unspecified site: Secondary | ICD-10-CM

## 2021-01-23 DIAGNOSIS — E871 Hypo-osmolality and hyponatremia: Secondary | ICD-10-CM | POA: Diagnosis not present

## 2021-01-23 DIAGNOSIS — Z20822 Contact with and (suspected) exposure to covid-19: Secondary | ICD-10-CM | POA: Diagnosis present

## 2021-01-23 DIAGNOSIS — Z905 Acquired absence of kidney: Secondary | ICD-10-CM | POA: Diagnosis not present

## 2021-01-23 DIAGNOSIS — Z8701 Personal history of pneumonia (recurrent): Secondary | ICD-10-CM

## 2021-01-23 DIAGNOSIS — R197 Diarrhea, unspecified: Secondary | ICD-10-CM | POA: Diagnosis not present

## 2021-01-23 DIAGNOSIS — C688 Malignant neoplasm of overlapping sites of urinary organs: Secondary | ICD-10-CM | POA: Diagnosis not present

## 2021-01-23 DIAGNOSIS — K449 Diaphragmatic hernia without obstruction or gangrene: Secondary | ICD-10-CM | POA: Diagnosis not present

## 2021-01-23 DIAGNOSIS — C787 Secondary malignant neoplasm of liver and intrahepatic bile duct: Principal | ICD-10-CM | POA: Diagnosis present

## 2021-01-23 DIAGNOSIS — Z923 Personal history of irradiation: Secondary | ICD-10-CM

## 2021-01-23 DIAGNOSIS — Z79818 Long term (current) use of other agents affecting estrogen receptors and estrogen levels: Secondary | ICD-10-CM

## 2021-01-23 DIAGNOSIS — Z79899 Other long term (current) drug therapy: Secondary | ICD-10-CM

## 2021-01-23 DIAGNOSIS — Z7982 Long term (current) use of aspirin: Secondary | ICD-10-CM

## 2021-01-23 DIAGNOSIS — R1110 Vomiting, unspecified: Secondary | ICD-10-CM | POA: Diagnosis not present

## 2021-01-23 LAB — COMPREHENSIVE METABOLIC PANEL
ALT: 25 U/L (ref 0–44)
AST: 29 U/L (ref 15–41)
Albumin: 3.1 g/dL — ABNORMAL LOW (ref 3.5–5.0)
Alkaline Phosphatase: 91 U/L (ref 38–126)
Anion gap: 9 (ref 5–15)
BUN: 20 mg/dL (ref 8–23)
CO2: 22 mmol/L (ref 22–32)
Calcium: 8.7 mg/dL — ABNORMAL LOW (ref 8.9–10.3)
Chloride: 103 mmol/L (ref 98–111)
Creatinine, Ser: 1.1 mg/dL (ref 0.61–1.24)
GFR, Estimated: >60 mL/min (ref >60)
Glucose, Bld: 133 mg/dL — ABNORMAL HIGH (ref 70–99)
Potassium: 4.2 mmol/L (ref 3.5–5.1)
Sodium: 134 mmol/L — ABNORMAL LOW (ref 135–145)
Total Bilirubin: 1.2 mg/dL (ref 0.3–1.2)
Total Protein: 7.2 g/dL (ref 6.5–8.1)

## 2021-01-23 LAB — CBC
HCT: 25.9 % — ABNORMAL LOW (ref 39.0–52.0)
Hemoglobin: 8.2 g/dL — ABNORMAL LOW (ref 13.0–17.0)
MCH: 26.4 pg (ref 26.0–34.0)
MCHC: 31.7 g/dL (ref 30.0–36.0)
MCV: 83.3 fL (ref 80.0–100.0)
Platelets: 222 10*3/uL (ref 150–400)
RBC: 3.11 MIL/uL — ABNORMAL LOW (ref 4.22–5.81)
RDW: 17 % — ABNORMAL HIGH (ref 11.5–15.5)
WBC: 9 10*3/uL (ref 4.0–10.5)
nRBC: 0 % (ref 0.0–0.2)

## 2021-01-23 LAB — LIPASE, BLOOD: Lipase: 32 U/L (ref 11–51)

## 2021-01-23 MED ORDER — IOHEXOL 350 MG/ML SOLN
80.0000 mL | Freq: Once | INTRAVENOUS | Status: AC | PRN
  Administered 2021-01-23: 80 mL via INTRAVENOUS

## 2021-01-23 MED ORDER — SODIUM CHLORIDE 0.9 % IV BOLUS
1000.0000 mL | Freq: Once | INTRAVENOUS | Status: AC
  Administered 2021-01-23: 1000 mL via INTRAVENOUS

## 2021-01-23 MED ORDER — SODIUM CHLORIDE 0.9 % IV SOLN
12.5000 mg | Freq: Four times a day (QID) | INTRAVENOUS | Status: DC | PRN
  Administered 2021-01-23: 12.5 mg via INTRAVENOUS
  Filled 2021-01-23: qty 12.5

## 2021-01-23 MED ORDER — ONDANSETRON 4 MG PO TBDP
4.0000 mg | ORAL_TABLET | Freq: Once | ORAL | Status: AC | PRN
  Administered 2021-01-23: 4 mg via ORAL
  Filled 2021-01-23: qty 1

## 2021-01-23 MED ORDER — ASPIRIN EC 81 MG PO TBEC
81.0000 mg | DELAYED_RELEASE_TABLET | Freq: Every day | ORAL | Status: DC
  Administered 2021-01-24 – 2021-01-26 (×3): 81 mg via ORAL
  Filled 2021-01-23 (×3): qty 1

## 2021-01-23 MED ORDER — HYDROMORPHONE HCL 1 MG/ML IJ SOLN
1.0000 mg | INTRAMUSCULAR | Status: DC | PRN
Start: 2021-01-23 — End: 2021-01-25
  Administered 2021-01-23 – 2021-01-25 (×5): 1 mg via INTRAVENOUS
  Filled 2021-01-23 (×6): qty 1

## 2021-01-23 MED ORDER — PROCHLORPERAZINE EDISYLATE 10 MG/2ML IJ SOLN
10.0000 mg | Freq: Once | INTRAMUSCULAR | Status: AC
  Administered 2021-01-23: 10 mg via INTRAVENOUS
  Filled 2021-01-23: qty 2

## 2021-01-23 MED ORDER — NITROGLYCERIN 0.4 MG SL SUBL: 0.4000 mg | SUBLINGUAL_TABLET | SUBLINGUAL | Status: DC | PRN

## 2021-01-23 MED ORDER — ONDANSETRON HCL 4 MG PO TABS
4.0000 mg | ORAL_TABLET | Freq: Four times a day (QID) | ORAL | Status: DC | PRN
  Administered 2021-01-24 (×2): 4 mg via ORAL
  Filled 2021-01-23 (×2): qty 1

## 2021-01-23 MED ORDER — SENNOSIDES-DOCUSATE SODIUM 8.6-50 MG PO TABS
1.0000 | ORAL_TABLET | Freq: Two times a day (BID) | ORAL | Status: DC
  Administered 2021-01-24 – 2021-01-25 (×2): 1 via ORAL
  Filled 2021-01-23 (×3): qty 1

## 2021-01-23 MED ORDER — ONDANSETRON HCL 4 MG/2ML IJ SOLN
4.0000 mg | Freq: Once | INTRAMUSCULAR | Status: AC
  Administered 2021-01-23: 4 mg via INTRAVENOUS
  Filled 2021-01-23: qty 2

## 2021-01-23 MED ORDER — ENOXAPARIN SODIUM 40 MG/0.4ML IJ SOSY
40.0000 mg | PREFILLED_SYRINGE | INTRAMUSCULAR | Status: DC
  Administered 2021-01-23: 40 mg via SUBCUTANEOUS
  Filled 2021-01-23: qty 0.4

## 2021-01-23 MED ORDER — ATORVASTATIN CALCIUM 40 MG PO TABS
80.0000 mg | ORAL_TABLET | Freq: Every day | ORAL | Status: DC
  Administered 2021-01-24 – 2021-01-26 (×3): 80 mg via ORAL
  Filled 2021-01-23 (×3): qty 2

## 2021-01-23 MED ORDER — SODIUM CHLORIDE 0.9 % IV SOLN: INTRAVENOUS | Status: DC

## 2021-01-23 MED ORDER — ONDANSETRON HCL 4 MG/2ML IJ SOLN
4.0000 mg | Freq: Four times a day (QID) | INTRAMUSCULAR | Status: DC | PRN
  Administered 2021-01-23: 4 mg via INTRAVENOUS
  Filled 2021-01-23 (×2): qty 2

## 2021-01-23 MED ORDER — HYDROMORPHONE HCL 1 MG/ML IJ SOLN
1.0000 mg | Freq: Once | INTRAMUSCULAR | Status: AC
  Administered 2021-01-23: 1 mg via INTRAVENOUS
  Filled 2021-01-23: qty 1

## 2021-01-23 MED ORDER — OXYCODONE HCL 5 MG PO TABS
5.0000 mg | ORAL_TABLET | ORAL | Status: DC | PRN
  Administered 2021-01-24 – 2021-01-25 (×5): 5 mg via ORAL
  Filled 2021-01-23 (×5): qty 1

## 2021-01-23 MED ORDER — MORPHINE SULFATE (PF) 4 MG/ML IV SOLN
4.0000 mg | Freq: Once | INTRAVENOUS | Status: AC
Start: 2021-01-23 — End: 2021-01-23
  Administered 2021-01-23: 4 mg via INTRAVENOUS
  Filled 2021-01-23: qty 1

## 2021-01-23 MED ORDER — ACETAMINOPHEN 325 MG PO TABS: 650.0000 mg | ORAL_TABLET | Freq: Four times a day (QID) | ORAL | Status: DC | PRN

## 2021-01-23 MED ORDER — SODIUM CHLORIDE (PF) 0.9 % IJ SOLN
INTRAMUSCULAR | Status: AC
  Filled 2021-01-23: qty 50

## 2021-01-23 MED ORDER — ACETAMINOPHEN 650 MG RE SUPP: 650.0000 mg | Freq: Four times a day (QID) | RECTAL | Status: DC | PRN

## 2021-01-23 MED ORDER — METOPROLOL SUCCINATE ER 50 MG PO TB24
50.0000 mg | ORAL_TABLET | Freq: Every day | ORAL | Status: DC
  Administered 2021-01-24 – 2021-01-25 (×2): 50 mg via ORAL
  Filled 2021-01-23 (×2): qty 1

## 2021-01-23 NOTE — ED Provider Notes (Signed)
[facility] DEPT Provider Note   CSN: [id number] Arrival date & time: 01/23/21  0709     History Chief Complaint  Patient presents with   Emesis   Dehydration    Lawrence Hall is a 75 y.o. male. 75 year old man with stage IV high-grade urothelial carcinoma with bone and hepatic involvement diagnosed in 2021.  He is getting radiation and chemotherapy and follows with Dr. Alen Blew.  He has had a lot of weight loss and so was recently started on Megestrol 2 days ago.  He initially had some diarrhea but since 6 PM last night has had back pain abdominal pain and vomiting.  No fevers or chills.  No chest pain or shortness of breath.  Feels very weak and dehydrated now.  No blood in the vomitus or diarrhea.  No prior history of back issues.  Rates the pain as 10.  The history is provided by the patient.  Emesis Severity:  Moderate Duration:  6 hours Timing:  Intermittent Progression:  Unchanged Chronicity:  New Relieved by:  Nothing Worsened by:  Nothing Ineffective treatments:  None tried Associated symptoms: abdominal pain and diarrhea   Associated symptoms: no arthralgias, no chills, no cough, no fever, no headaches and no sore throat   Abdominal pain:    Location:  Generalized   Quality: cramping     Severity:  Moderate   Onset quality:  Gradual   Duration:  12 hours   Timing:  Intermittent   Progression:  Unchanged   Chronicity:  New Risk factors: no sick contacts       Past Medical History:  Diagnosis Date   Arthritis    mild hands   Asthma    pt denies 4/21    Chest pain 07/25/2012   Coronary artery disease    Dysrhythmia    PAC'S, hx of atrial fib after OHS 04/2018   ED (erectile dysfunction)    H/O hiatal hernia    Heart murmur    Hypercholesterolemia    Hypertension    MARGINALLY HIGH - NO MEDS -MONITORS   Metastatic cancer to bone ([place]) dx'd 07/2020   rt humerus   Myocardial infarction ([place])    2017    Pneumonia    hx of 2018     PONV (postoperative nausea and vomiting)    Prostate cancer ([place]) 03/27/2011   prostate bx=adenocarcinoma,   Skin cancer 2008   basal cell face /removed   Sleep apnea    borderline sleep apnea per patient no devices    Urothelial cancer ([place]) dx'd 05/2019   left renal ca    Patient Active Problem List   Diagnosis Date Noted   Metastasis to bone ([place]) 10/30/2020   Goals of care, counseling/discussion 09/06/2020   Malignant tumor of renal pelvis, left ([place]) 01/04/2020   Port-A-Cath in place 11/02/2019   Right rotator cuff tear 09/19/2019   History of aortic valve replacement with bioprosthetic valve 05/18/2018   Postoperative atrial fibrillation ([place]) 05/18/2018   LBBB (left bundle branch block) 05/18/2018   S/P CABG x 3 04/29/2018   CAD (coronary artery disease), native coronary artery 04/26/2018   Near syncope    Nonrheumatic aortic valve stenosis    Non-STEMI (non-ST elevated myocardial infarction) [facility])    Essential hypertension 06/01/2015   Asthma 05/31/2015   Elevated troponin 05/31/2015   Arthritis    Hyperlipidemia    ED (erectile dysfunction)     Past Surgical History:  Procedure Laterality Date  AORTIC VALVE REPLACEMENT N/A 04/29/2018   Procedure: AORTIC VALVE REPLACEMENT (AVR) using INSPIRIS RESILIA  AORTIC VALVE 48mm;  Surgeon: Ivin Poot, MD;  Location: [place];  Service: Open Heart Surgery;  Laterality: N/A;   basal cell cancer  2008   inside nose   CARDIAC CATHETERIZATION N/A 06/03/2015   Procedure: Left Heart Cath and Coronary Angiography;  Surgeon: [name] Sine, MD;  Location: [place] [facility];  Service: Cardiovascular;  Laterality: N/A;   CATARACT EXTRACTION, BILATERAL  2007   CORONARY ARTERY BYPASS GRAFT N/A 04/29/2018   Procedure: CORONARY ARTERY BYPASS GRAFTING (CABG) X 3; Using Left Internal Mammary Artery (LIMA) and Right Leg Greater Saphenous Vein Graft (SVG);  LIMA to LAD, SVG to OM1, SVG to RCA,;  Surgeon: Ivin Poot, MD;  Location: [place];  Service: Open Heart Surgery;  Laterality: N/A;   CYSTOSCOPY/RETROGRADE/URETEROSCOPY Left 08/21/2019   Procedure: CYSTOSCOPY/RETROGRADE/URETEROSCOPY/  LEFT BIOPSY/BRUSH BIOPSY/RENAL WASHINGS/ AND STENT PLACEMENT;  Surgeon: Raynelle Bring, MD;  Location: [facility];  Service: Urology;  Laterality: Left;   IR IMAGING GUIDED PORT INSERTION  09/28/2019   RIGHT/LEFT HEART CATH AND CORONARY ANGIOGRAPHY N/A 04/26/2018   Procedure: RIGHT/LEFT HEART CATH AND CORONARY ANGIOGRAPHY;  Surgeon: [name] Sine, MD;  Location: [place] [facility];  Service: Cardiovascular;  Laterality: N/A;   ROBOT ASSISTED LAPAROSCOPIC RADICAL PROSTATECTOMY  08/10/2011   Procedure: ROBOTIC ASSISTED LAPAROSCOPIC RADICAL PROSTATECTOMY LEVEL 2;  Surgeon: Dutch Gray, MD;  Location: [facility];  Service: Urology;  Laterality: N/A;   ROBOT ASSITED LAPAROSCOPIC NEPHROURETERECTOMY Left 01/04/2020   Procedure: XI ROBOT ASSITED LAPAROSCOPIC NEPHROURETERECTOMY;  Surgeon: Raynelle Bring, MD;  Location: [facility];  Service: Urology;  Laterality: Left;  NEEDS 240 MIN FOR PROCEDURE   TEE WITHOUT CARDIOVERSION N/A 04/29/2018   Procedure: TRANSESOPHAGEAL ECHOCARDIOGRAM (TEE);  Surgeon: Prescott Gum, Collier Salina, MD;  Location: [place];  Service: Open Heart Surgery;  Laterality: N/A;   THYROID SURGERY  infant   uncertain details; no thyroid replacement listed   WISDOM TOOTH EXTRACTION         Family History  Problem Relation Age of Onset   Prostate cancer Father 58       in his 70's/other comorbidities/79 deceased   Alcohol abuse Father    Liver disease Father    Heart attack Father 53   Breast cancer Mother 29       mets to bone,deceased age 71   Cervical cancer Sister    Cervical cancer Sister    Breast cancer Sister    CAD Brother        Died of sudden cardiac death/MI    Social History   Tobacco Use   Smoking status: Never   Smokeless tobacco: Never  Vaping Use   Vaping Use: Never used  Substance Use Topics   Alcohol use: Not Currently   Drug  use: No    Home Medications Prior to Admission medications   Medication Sig Start Date End Date Taking? Authorizing Provider  acetaminophen (TYLENOL) 500 MG tablet Take 500 mg by mouth as needed for moderate pain.    [provider]  aspirin EC 81 MG tablet Take 1 tablet (81 mg total) by mouth daily. Swallow whole. 01/24/20   [name], [name], MD  atorvastatin (LIPITOR) 80 MG tablet TAKE 1 TABLET ONCE DAILY AT 6 PM. 07/25/20   [name], Mihai, MD  cholecalciferol (VITAMIN D3) 25 MCG (1000 UNIT) tablet Take 1,000 Units by mouth daily.    [provider]  megestrol (MEGACE) 400 MG/10ML suspension Take 10 mLs (400 mg total) by mouth daily. 01/20/21   Wyatt Portela, MD  metoprolol succinate (TOPROL-XL) 50 MG 24 hr tablet Take 1 tablet (50 mg total) by mouth at bedtime. 02/06/20   [name], Mihai, MD  Milk Thistle 1000 MG CAPS Take 1 capsule by mouth daily.    [provider]  Multiple Vitamins-Minerals (IMMUNE SUPPORT VITAMIN C PO) Take by mouth.    [provider]  nitroGLYCERIN (NITROSTAT) 0.4 MG SL tablet 1 TAB UNDER TONGUE AS NEEDED FOR CHEST PAIN. MAY REPEAT EVERY 5 MIN FOR A TOTAL OF 3 DOSES. 06/21/19   [name], Mihai, MD  VITAMIN E PO Take 500 mg by mouth.    [provider]    Allergies    Other  Review of Systems   Review of Systems  Constitutional:  Negative for chills and fever.  HENT:  Negative for ear pain and sore throat.   Eyes:  Negative for pain and visual disturbance.  Respiratory:  Negative for cough and shortness of breath.   Cardiovascular:  Negative for chest pain and palpitations.  Gastrointestinal:  Positive for abdominal pain, diarrhea and vomiting.  Genitourinary:  Negative for dysuria and hematuria.  Musculoskeletal:  Positive for back pain. Negative for arthralgias.  Skin:  Negative for color change and rash.  Neurological:  Negative for seizures, syncope and headaches.  All other systems reviewed and are  negative.  Physical Exam Updated Vital Signs BP (!) 142/79   Pulse 78   Temp 98.3 F (36.8 C) (Oral)   Resp 16   SpO2 100%   Physical Exam Vitals and nursing note reviewed.  Constitutional:      Appearance: Normal appearance. He is well-developed.  HENT:     Head: Normocephalic and atraumatic.  Eyes:     Conjunctiva/sclera: Conjunctivae normal.  Cardiovascular:     Rate and Rhythm: Normal rate and regular rhythm.     Heart sounds: No murmur heard. Pulmonary:     Effort: Pulmonary effort is normal. No respiratory distress.     Breath sounds: Normal breath sounds.  Abdominal:     Palpations: Abdomen is soft.     Tenderness: There is no abdominal tenderness. There is no guarding or rebound.  Musculoskeletal:        General: No deformity or signs of injury. Normal range of motion.     Cervical back: Neck supple.  Skin:    General: Skin is warm and dry.  Neurological:     General: No focal deficit present.     Mental Status: He is alert.    ED Results / Procedures / Treatments   Labs (all labs ordered are listed, but only abnormal results are displayed) Labs Reviewed  COMPREHENSIVE METABOLIC PANEL - Abnormal; Notable for the following components:      Result Value   Sodium 134 (*)    Glucose, Bld 133 (*)    Calcium 8.7 (*)    Albumin 3.1 (*)    All other components within normal limits  CBC - Abnormal; Notable for the following components:   RBC 3.11 (*)    Hemoglobin 8.2 (*)    HCT 25.9 (*)    RDW 17.0 (*)    All other components within normal limits  RESP PANEL BY RT-PCR (FLU A&B, COVID) ARPGX2  LIPASE, BLOOD  URINALYSIS, ROUTINE W REFLEX MICROSCOPIC    EKG None  Radiology CT Chest W Contrast  Result Date:  01/23/2021 CLINICAL DATA:  Abdominal pain with vomiting, back pain, and diarrhea. History of metastatic left renal malignancy and history prostate cancer. EXAM: CT CHEST, ABDOMEN, AND PELVIS WITH CONTRAST TECHNIQUE: Multidetector CT imaging of the  chest, abdomen and pelvis was performed following the standard protocol during bolus administration of intravenous contrast. CONTRAST:  15mL OMNIPAQUE IOHEXOL 350 MG/ML SOLN COMPARISON:  Multiple exams, including 09/03/2020 FINDINGS: CT CHEST FINDINGS Cardiovascular: Right Port-A-Cath tip: Cavoatrial junction. Coronary, aortic arch, and branch vessel atherosclerotic vascular disease. Prior CABG. Mild cardiomegaly. Ascending thoracic aortic currently measured 3.8 cm. Mediastinum/Nodes: Unremarkable Lungs/Pleura: Mild scarring inferiorly in the right middle lobe appears unchanged. There is mild dependent atelectasis in both lower lobes. Mild left upper lobe scarring anteriorly appears unchanged. No compelling findings of metastatic disease to the lungs. Musculoskeletal: Postoperative findings in the right humerus. Healing fracture of the left anterior seventh rib on image 127 series 4, new compared to the prior exam of 09/03/2020. Lower thoracic spondylosis. CT ABDOMEN PELVIS FINDINGS Hepatobiliary: Scattered ill-defined hepatic metastatic lesions are present in the liver, and appear increased in size and number compared to previous. One of the larger lesions inferiorly in the right hepatic lobe measures 9.5 by 7.4 cm, previously measured at 4.9 by 4.7 cm. Another dominant lesion in the right hepatic lobe measures 7.3 by 4.5 cm, previously measured at 3.7 by 2.7 cm. Other scattered smaller hypodense lesions in the liver are present, some of these are new compared to 09/03/2020 although admittedly the use of contrast on today's exam might increase conspicuity substantially. Adjacent to the inferior right hepatic lobe lesion there is small amount of perihepatic ascites and stranding in the adjacent adipose tissues. Mild gallbladder wall thickening with multiple gallstones in the gallbladder. No biliary dilatation. Subtle periportal edema. Pancreas: Unremarkable Spleen: Small amount of fluid density along the medial  spleen increased from prior, image 58 series 2, potentially simply a small amount of local ascites. Accessory splenic tissue noted. Adrenals/Urinary Tract: Adrenal glands unremarkable. Left nephrectomy without findings of local recurrence. Small cyst of the right kidney lower pole, technically too small to characterize at 0.8 cm in diameter, although statistically likely to be benign. No hydronephrosis or hydroureter. Urinary bladder unremarkable. Stomach/Bowel: The hepatic flexure but some of the stranding adjacent to the liver, I do not see definite findings of abnormality in the colon, with areas of mild wall thickening in this vicinity probably related to nondistention. Vascular/Lymphatic: Atherosclerosis is present, including aortoiliac atherosclerotic disease. Infrarenal left-sided IVC, a venous variant. Chronic mural thrombus along the right proximal common iliac artery. Reproductive: Prostatectomy. Other: In addition to the perihepatic ascites there is a small amount of ascites in the right lower quadrant although the adjacent appendix appears normal. Musculoskeletal: Lucent lesion inferiorly in the L4 vertebral body is probably related to two adjacent Schmorl's nodes, and is unchanged from prior. IMPRESSION: 1. Enlarging hepatic metastatic lesions. Adjacent to the dominant inferior right hepatic lobe lesion there is capsular bulging, stranding in the adjacent perihepatic adipose tissues, and a small amount of perihepatic ascites. This raises the possibility of tumor extension or mild bleeding around the capsule leading to local inflammation. If the patient has had radiation therapy to the inferior right hepatic lobe lesion than that might be an alternative cause for the stranding in the perihepatic adipose tissue. Small amount of ascites is present along the gallbladder fossa and along the right lower paracolic gutter. There is also trace ascites along the medial margin of the spleen. 2. Currently no  findings of metastatic lesions aside from those throughout the liver. 3. Healing subacute fracture of the left anterior seventh rib. 4. Cholelithiasis (cannot exclude cholecystitis given the surrounding fluid, nuclear medicine hepatobiliary scan could help assess patency of the cystic duct if clinically warranted). 5. Other imaging findings of potential clinical significance: Aortic Atherosclerosis (ICD10-I70.0). Coronary atherosclerosis. Mild cardiomegaly. Left nephrectomy. Infrarenal left-sided IVC, a venous variant. Electronically Signed   By: Van Clines M.D.   On: 01/23/2021 12:43   CT Abdomen Pelvis W Contrast  Result Date: 01/23/2021 CLINICAL DATA:  Abdominal pain with vomiting, back pain, and diarrhea. History of metastatic left renal malignancy and history prostate cancer. EXAM: CT CHEST, ABDOMEN, AND PELVIS WITH CONTRAST TECHNIQUE: Multidetector CT imaging of the chest, abdomen and pelvis was performed following the standard protocol during bolus administration of intravenous contrast. CONTRAST:  32mL OMNIPAQUE IOHEXOL 350 MG/ML SOLN COMPARISON:  Multiple exams, including 09/03/2020 FINDINGS: CT CHEST FINDINGS Cardiovascular: Right Port-A-Cath tip: Cavoatrial junction. Coronary, aortic arch, and branch vessel atherosclerotic vascular disease. Prior CABG. Mild cardiomegaly. Ascending thoracic aortic currently measured 3.8 cm. Mediastinum/Nodes: Unremarkable Lungs/Pleura: Mild scarring inferiorly in the right middle lobe appears unchanged. There is mild dependent atelectasis in both lower lobes. Mild left upper lobe scarring anteriorly appears unchanged. No compelling findings of metastatic disease to the lungs. Musculoskeletal: Postoperative findings in the right humerus. Healing fracture of the left anterior seventh rib on image 127 series 4, new compared to the prior exam of 09/03/2020. Lower thoracic spondylosis. CT ABDOMEN PELVIS FINDINGS Hepatobiliary: Scattered ill-defined hepatic  metastatic lesions are present in the liver, and appear increased in size and number compared to previous. One of the larger lesions inferiorly in the right hepatic lobe measures 9.5 by 7.4 cm, previously measured at 4.9 by 4.7 cm. Another dominant lesion in the right hepatic lobe measures 7.3 by 4.5 cm, previously measured at 3.7 by 2.7 cm. Other scattered smaller hypodense lesions in the liver are present, some of these are new compared to 09/03/2020 although admittedly the use of contrast on today's exam might increase conspicuity substantially. Adjacent to the inferior right hepatic lobe lesion there is small amount of perihepatic ascites and stranding in the adjacent adipose tissues. Mild gallbladder wall thickening with multiple gallstones in the gallbladder. No biliary dilatation. Subtle periportal edema. Pancreas: Unremarkable Spleen: Small amount of fluid density along the medial spleen increased from prior, image 58 series 2, potentially simply a small amount of local ascites. Accessory splenic tissue noted. Adrenals/Urinary Tract: Adrenal glands unremarkable. Left nephrectomy without findings of local recurrence. Small cyst of the right kidney lower pole, technically too small to characterize at 0.8 cm in diameter, although statistically likely to be benign. No hydronephrosis or hydroureter. Urinary bladder unremarkable. Stomach/Bowel: The hepatic flexure but some of the stranding adjacent to the liver, I do not see definite findings of abnormality in the colon, with areas of mild wall thickening in this vicinity probably related to nondistention. Vascular/Lymphatic: Atherosclerosis is present, including aortoiliac atherosclerotic disease. Infrarenal left-sided IVC, a venous variant. Chronic mural thrombus along the right proximal common iliac artery. Reproductive: Prostatectomy. Other: In addition to the perihepatic ascites there is a small amount of ascites in the right lower quadrant although the  adjacent appendix appears normal. Musculoskeletal: Lucent lesion inferiorly in the L4 vertebral body is probably related to two adjacent Schmorl's nodes, and is unchanged from prior. IMPRESSION: 1. Enlarging hepatic metastatic lesions. Adjacent to the dominant inferior right hepatic lobe lesion there is capsular bulging, stranding  in the adjacent perihepatic adipose tissues, and a small amount of perihepatic ascites. This raises the possibility of tumor extension or mild bleeding around the capsule leading to local inflammation. If the patient has had radiation therapy to the inferior right hepatic lobe lesion than that might be an alternative cause for the stranding in the perihepatic adipose tissue. Small amount of ascites is present along the gallbladder fossa and along the right lower paracolic gutter. There is also trace ascites along the medial margin of the spleen. 2. Currently no findings of metastatic lesions aside from those throughout the liver. 3. Healing subacute fracture of the left anterior seventh rib. 4. Cholelithiasis (cannot exclude cholecystitis given the surrounding fluid, nuclear medicine hepatobiliary scan could help assess patency of the cystic duct if clinically warranted). 5. Other imaging findings of potential clinical significance: Aortic Atherosclerosis (ICD10-I70.0). Coronary atherosclerosis. Mild cardiomegaly. Left nephrectomy. Infrarenal left-sided IVC, a venous variant. Electronically Signed   By: Van Clines M.D.   On: 01/23/2021 12:43    Procedures Procedures   Medications Ordered in ED Medications  HYDROmorphone (DILAUDID) injection 1 mg (1 mg Intravenous Given 01/23/21 1703)  enoxaparin (LOVENOX) injection 40 mg (has no administration in time range)  acetaminophen (TYLENOL) tablet 650 mg (has no administration in time range)    Or  acetaminophen (TYLENOL) suppository 650 mg (has no administration in time range)  ondansetron (ZOFRAN) tablet 4 mg ( Oral See  Alternative 01/23/21 1702)    Or  ondansetron (ZOFRAN) injection 4 mg (4 mg Intravenous Given 01/23/21 1702)  0.9 %  sodium chloride infusion ( Intravenous New Bag/Given 01/23/21 1642)  ondansetron (ZOFRAN-ODT) disintegrating tablet 4 mg (4 mg Oral Given 01/23/21 0727)  morphine 4 MG/ML injection 4 mg (4 mg Intravenous Given 01/23/21 0917)  sodium chloride 0.9 % bolus 1,000 mL (1,000 mLs Intravenous Bolus 01/23/21 0917)  iohexol (OMNIPAQUE) 350 MG/ML injection 80 mL (80 mLs Intravenous Contrast Given 01/23/21 1135)  HYDROmorphone (DILAUDID) injection 1 mg (1 mg Intravenous Given 01/23/21 1129)  sodium chloride 0.9 % bolus 1,000 mL (1,000 mLs Intravenous Bolus 01/23/21 1132)  sodium chloride (PF) 0.9 % injection (  Given 01/23/21 1135)  ondansetron (ZOFRAN) injection 4 mg (4 mg Intravenous Given 01/23/21 1244)  prochlorperazine (COMPAZINE) injection 10 mg (10 mg Intravenous Given 01/23/21 1449)    ED Course  I have reviewed the triage vital signs and the nursing notes.  Pertinent labs & imaging results that were available during my care of the patient were reviewed by me and considered in my medical decision making (see chart for details).  Clinical Course as of 01/23/21 1814  Thu Jan 23, 2021  1305 Reviewed results of CT with Dr. Alen Blew.  He felt the patient could be discharged and follow-up outpatient with him but also was fine if he needed to stay for pain control and hydration.  Reviewed this with the patient.  He is going to discuss with his wife and get back to me with plan. [MB]  4098 Reviewed results and plan with patient.  He states he does not feel he would be able to be discharged today. [MB]  1191 Discussed with [facility] hospitalist Dr. Olevia Bowens who will evaluate the patient for admission. [MB]    Clinical Course User Index [MB] Hayden Rasmussen, MD   MDM Rules/Calculators/A&P                          This patient complains of back pain  abdominal pain nausea and vomiting; this involves an  extensive number of treatment Options and is a complaint that carries with it a high risk of complications and Morbidity. The differential includes obstruction, perforation, worsening malignancy, pancreatitis, colitis, dehydration, renal failure, metabolic derangement  I ordered, reviewed and interpreted labs, which included CBC with normal white count, hemoglobin slightly lower than priors, chemistries with mildly elevated glucose and low calcium, normal LFTs, lipase normal I ordered medication IV fluids IV nausea medication and pain medication with some improvement in his symptoms I ordered imaging studies which included CT abdomen pelvis and I independently    visualized and interpreted imaging which showed no obstruction.  Does have worsening of his metastatic disease in his liver. Additional history obtained from patient's wife Previous records obtained and reviewed in epic including recent oncology notes I consulted Dr. Alen Blew oncology and Dr. Olevia Bowens [facility] hospitalist and discussed lab and imaging findings  Critical Interventions: None  After the interventions stated above, I reevaluated the patient and found patient to be symptomatically improved although does not feel back to baseline.  He was given the option for treatment at home with pain and nausea medication versus admission to the hospital chooses to be admitted.  I let Dr. Alen Blew know and he will see the patient tomorrow.  Discussed with Dr. Olevia Bowens from [facility] hospitalist who will evaluate patient for admission.   Final Clinical Impression(s) / ED Diagnoses Final diagnoses:  Intractable nausea and vomiting  Metastatic malignant neoplasm, unspecified site [facility])  Intractable pain    Rx / DC Orders ED Discharge Orders     None        Hayden Rasmussen, MD 01/23/21 1820

## 2021-01-23 NOTE — ED Triage Notes (Signed)
Pt complains of stomach pain, vomiting, back pain, diarrhea.x 2 days. Feels dehydrated. He started taking megestrol earlier this week. Symptoms became worse last night. Last time taking megestrol was 11am yesterday.

## 2021-01-23 NOTE — Progress Notes (Signed)
Chronic Care Management Pharmacy Note  01/23/2021 Name:  Lawrence Hall MRN:  [id number] DOB:  06-19-1945  Summary: Pt is concerned with losing weight and is looking for something to help BP is at goal < 130/80  Recommendations/Changes made from today's visit: -Recommended stopping vitamin E supplementation due to additive risk for bleeding and removed from med list -Recommend mirtazapine to help with sleep and increased appetite -Recommend Pepcid for indigestion/upset stomach -Consider decreasing metoprolol due to dizziness  Plan: Follow up in 1 month on new medication assessment   Subjective: Lawrence Hall is an 75 y.o. year old male who is a primary patient of [name], [name], MD.  The CCM team was consulted for assistance with disease management and care coordination needs.    Engaged with patient face to face for initial visit in response to provider referral for pharmacy case management and/or care coordination services.   Consent to Services:  The patient was given the following information about Chronic Care Management services today, agreed to services, and gave verbal consent: 1. CCM service includes personalized support from designated clinical staff supervised by the primary care provider, including individualized plan of care and coordination with other care providers 2. 24/7 contact phone numbers for assistance for urgent and routine care needs. 3. Service will only be billed when office clinical staff spend 20 minutes or more in a month to coordinate care. 4. Only one practitioner may furnish and bill the service in a calendar month. 5.The patient may stop CCM services at any time (effective at the end of the month) by phone call to the office staff. 6. The patient will be responsible for cost sharing (co-pay) of up to 20% of the service fee (after annual deductible is met). Patient agreed to services and consent obtained.  Patient Care Team: [name], MD as  PCP - General (Family Medicine) [name], Dani Gobble, MD as PCP - Cardiology (Cardiology) Raynelle Bring, MD as Consulting Physician (Urology) Viona Gilmore, [facility] as Pharmacist (Pharmacist)  Recent office visits: 11/22/2020 [name] (PCP) - Patient was seen for weight loss and other issues. Discontinued Biotin. No follow up noted. Follow up in 6 months   07/24/2020 [name] (PCP) - Patient was seen for preventative health care and additional issues. No medication changes. No follow up noted.  Recent consult visits: 12/13/2020 [name] (oncology) - Patient was seen for Malignant tumor of renal pelvis left. No medication changes. Follow up in 6 weeks.   11/25/2020 [name] [facility] (Orthopedics) - Patient was seen for cancer metastatic to bone. No medication changes. Follow up in 3 months.   10/25/2020 [name] (oncology) - Patient was seen for Malignant tumor of renal pelvis left. No medication changes. Follow up in 6 weeks.   10/22/2020 [name] [facility] (Orthopedics) - Patient was seen post operative state. No mediations changes. Follow up in 5 weeks.   09/19/2020 [name] (radiology) -  Patient was seen for cancer, metastatic to bone. No medication changes. No follow up noted.   09/06/2020 [name] (oncology) - Patient was seen for Malignant neoplasm of urinary bladder and additional issue.  No medication changes. No follow up noted.    08/21/2020 [name] (Sport Medicine) - Patient was seen for Chronic right shoulder pain and 1 additional issue.  No medication changes. Follow up after imaging.  Hospital visits: 10/01/2020 [name](General Surgery) Admission to [facility]. Patient was admitted for surgery Right arm mass. Patient was discharged 10/04/2020.   Objective:  Lab Results  Component Value Date   CREATININE 1.10 01/23/2021   BUN 20 01/23/2021   GFR 49.84 (L) 11/22/2020    GFRNONAA >60 01/23/2021   GFRAA 43 (L) 01/05/2020   NA 134 (L) 01/23/2021   K 4.2 01/23/2021   CALCIUM 8.7 (L) 01/23/2021   CO2 22 01/23/2021   GLUCOSE 133 (H) 01/23/2021    Lab Results  Component Value Date/Time   HGBA1C 5.4 04/28/2018 03:48 AM   GFR 49.84 (L) 11/22/2020 11:21 AM    Last diabetic Eye exam: No results found for: HMDIABEYEEXA  Last diabetic Foot exam: No results found for: HMDIABFOOTEX   Lab Results  Component Value Date   CHOL 136 04/07/2019   HDL 42 04/07/2019   LDLCALC 71 04/07/2019   TRIG 127 04/07/2019   CHOLHDL 3.2 04/07/2019    Hepatic Function Latest Ref Rng & Units 01/23/2021 01/16/2021 12/13/2020  Total Protein 6.5 - 8.1 g/dL 7.2 7.3 7.5  Albumin 3.5 - 5.0 g/dL 3.1(L) 3.2(L) 3.2(L)  AST 15 - 41 U/L 29 44(H) 26  ALT 0 - 44 U/L 25 45(H) 22  Alk Phosphatase 38 - 126 U/L 91 99 115  Total Bilirubin 0.3 - 1.2 mg/dL 1.2 1.2 1.0  Bilirubin, Direct 0.0 - 0.2 mg/dL - - -    Lab Results  Component Value Date/Time   TSH 1.844 12/13/2020 12:13 PM   TSH 2.70 11/22/2020 11:21 AM   TSH 1.954 11/01/2020 11:15 AM   TSH 4.120 05/30/2018 10:44 AM   FREET4 0.74 (L) 04/28/2018 03:48 AM    CBC Latest Ref Rng & Units 01/23/2021 01/16/2021 12/13/2020  WBC 4.0 - 10.5 K/uL 9.0 6.4 7.4  Hemoglobin 13.0 - 17.0 g/dL 8.2(L) 8.5(L) 9.1(L)  Hematocrit 39.0 - 52.0 % 25.9(L) 26.8(L) 28.1(L)  Platelets 150 - 400 K/uL 222 239 240    Lab Results  Component Value Date/Time   VD25OH 45.09 11/22/2020 11:21 AM    Clinical ASCVD: Yes  The ASCVD Risk score (Arnett DK, et al., 2019) failed to calculate for the following reasons:   The patient has a prior MI or stroke diagnosis    Depression screen [facility] 2/9 07/24/2020 05/09/2020 01/24/2020  Decreased Interest 0 0 0  Down, Depressed, Hopeless 0 0 0  PHQ - 2 Score 0 0 0  Altered sleeping 1 0 -  Tired, decreased energy 1 0 -  Change in appetite 0 0 -  Feeling bad or failure about yourself  1 0 -  Trouble concentrating 0 0 -   Moving slowly or fidgety/restless 0 0 -  Suicidal thoughts 0 0 -  PHQ-9 Score 3 0 -  Difficult doing work/chores - Not difficult at all -  Some recent data might be hidden     Social History   Tobacco Use  Smoking Status Never  Smokeless Tobacco Never   BP Readings from Last 3 Encounters:  01/23/21 (!) 175/88  01/20/21 128/70  01/20/21 121/62   Pulse Readings from Last 3 Encounters:  01/23/21 70  01/20/21 85  01/20/21 (!) 109   Wt Readings from Last 3 Encounters:  01/20/21 165 lb 9.6 oz (75.1 kg)  01/16/21 169 lb (76.7 kg)  12/13/20 179 lb 8 oz (81.4 kg)   BMI Readings from Last 3 Encounters:  01/20/21 22.46 kg/m  01/16/21 22.92 kg/m  12/13/20 24.34 kg/m    Assessment/Interventions: Review of patient past medical  history, allergies, medications, health status, including review of consultants reports, laboratory and other test data, was performed as part of comprehensive evaluation and provision of chronic care management services.   SDOH:  (Social Determinants of Health) assessments and interventions performed: Yes SDOH Interventions    Flowsheet Row Most Recent Value  SDOH Interventions   Financial Strain Interventions Intervention Not Indicated  Transportation Interventions Intervention Not Indicated      SDOH Screenings   Alcohol Screen: Low Risk    Last Alcohol Screening Score (AUDIT): 0  Depression (PHQ2-9): Low Risk    PHQ-2 Score: 3  Financial Resource Strain: Low Risk    Difficulty of Paying Living Expenses: Not hard at all  Food Insecurity: No Food Insecurity   Worried About Charity fundraiser in the Last Year: Never true   Ran Out of Food in the Last Year: Never true  Housing: Low Risk    Last Housing Risk Score: 0  Physical Activity: Inactive   Days of Exercise per Week: 0 days   Minutes of Exercise per Session: 0 min  Social Connections: Moderately Integrated   Frequency of Communication with Friends and Family: Once a week   Frequency  of Social Gatherings with Friends and Family: Twice a week   Attends Religious Services: 1 to 4 times per year   Active Member of [employer] or Organizations: No   Attends Music therapist: Never   Marital Status: Married  Stress: No Stress Concern Present   Feeling of Stress : Not at all  Tobacco Use: Low Risk    Smoking Tobacco Use: Never   Smokeless Tobacco Use: Never  Transportation Needs: No Transportation Needs   Lack of Transportation (Medical): No   Lack of Transportation (Non-Medical): No   Patient presented with his wife in office. Patient's biggest concern today is his weight loss and lack of appetite, which have accompanied his cancer. He has lost about 15 lbs in 3 weeks and doesn't have much of an appetite at all. He usually drinks some boost or ensure throughout the day, PB crackers, mashed potatoes and mac n cheese. He has been having issues with dehydration recently and just received some IV fluids to help with that in oncology. His provider also prescribed Megesterol and he has a lot of questions about it and potential side effects.  He also notes that he has an upset stomach often but isn't able to burp to let it pass. He said he thinks it is from his stomach being "too empty" and describes it as similar to indigestion.   His wife is also concerned about his mood swings. He is constantly short with her and she wondered if there was anything to help with that.  He is limited in his ability to move around but tries to get out when he can. He feels weak very often right now especially due to the weight loss and is considering asking his oncologist to hold off on treatment for a few weeks to gain back some weight and energy.  He also notes difficulty sleeping and is unable to sleep for more than 3 hours at a time. He feels exhausted during the day as a result.   CCM Care Plan  Allergies  Allergen Reactions   Other Itching and Other (See Comments)    Pet dander,  seasonal allergies = Runny nose, itchy eyes    Medications Reviewed Today     Reviewed by Viona Gilmore, [facility] (Pharmacist) on  01/23/21 at 1022  Med List Status: <None>   Medication Order Taking? Sig Documenting Provider Last Dose Status Informant  acetaminophen (TYLENOL) 500 MG tablet [id number]  Take 500 mg by mouth as needed for moderate pain. [provider]  Active Self  aspirin EC 81 MG tablet [id number] Yes Take 1 tablet (81 mg total) by mouth daily. Swallow whole. [name], MD Taking Active   atorvastatin (LIPITOR) 80 MG tablet [id number] Yes TAKE 1 TABLET ONCE DAILY AT 6 PM. [name], Mihai, MD Taking Active   cholecalciferol (VITAMIN D3) 25 MCG (1000 UNIT) tablet [id number]  Take 1,000 Units by mouth daily. [provider]  Active   megestrol (MEGACE) 400 MG/10ML suspension [id number]  Take 10 mLs (400 mg total) by mouth daily. Wyatt Portela, MD  Active   metoprolol succinate (TOPROL-XL) 50 MG 24 hr tablet [id number] Yes Take 1 tablet (50 mg total) by mouth at bedtime. [name], Mihai, MD Taking Active   Milk Thistle 1000 MG CAPS [id number] Yes Take 1 capsule by mouth daily. [provider] Taking Active   Multiple Vitamins-Minerals (IMMUNE SUPPORT VITAMIN C PO) [id number] Yes Take by mouth. [provider] Taking Active   nitroGLYCERIN (NITROSTAT) 0.4 MG SL tablet [id number]  1 TAB UNDER TONGUE AS NEEDED FOR CHEST PAIN. MAY REPEAT EVERY 5 MIN FOR A TOTAL OF 3 DOSES. [name], Dani Gobble, MD  Active            Med Note Venda Rodes Aug 21, 2019  2:31 PM) Never used  VITAMIN E PO [id number]  Take 500 mg by mouth. [provider]  Active             Patient Active Problem List   Diagnosis Date Noted   Metastasis to bone ([place]) 10/30/2020   Goals of care, counseling/discussion 09/06/2020   Malignant tumor of renal pelvis, left ([place]) 01/04/2020   Port-A-Cath in place 11/02/2019   Right rotator cuff tear 09/19/2019    History of aortic valve replacement with bioprosthetic valve 05/18/2018   Postoperative atrial fibrillation ([place]) 05/18/2018   LBBB (left bundle branch block) 05/18/2018   S/P CABG x 3 04/29/2018   CAD (coronary artery disease), native coronary artery 04/26/2018   Near syncope    Nonrheumatic aortic valve stenosis    Non-STEMI (non-ST elevated myocardial infarction) ([place])    Essential hypertension 06/01/2015   Asthma 05/31/2015   Elevated troponin 05/31/2015   Arthritis    Hyperlipidemia    ED (erectile dysfunction)     Immunization History  Administered Date(s) Administered   Fluad Quad(high Dose 65+) 01/24/2020   Influenza,inj,Quad PF,6+ Mos 06/04/2015   Moderna Sars-Covid-2 Vaccination 08/06/2020   PFIZER(Purple Top)SARS-COV-2 Vaccination 06/11/2019, 07/11/2019, 12/19/2019, 08/22/2020   Pneumococcal Conjugate-13 04/20/2016   Pneumococcal Polysaccharide-23 04/20/2017   Tdap 04/20/2018   Zoster Recombinat (Shingrix) 02/16/2020, 07/03/2020    Conditions to be addressed/monitored:  Hypertension, Hyperlipidemia, Coronary Artery Disease, and GERD   Care Plan : CCM Pharmacy Care Plan  Updates made by Viona Gilmore, [place] since 01/23/2021 12:00 AM     Problem: Problem: Hypertension, Hyperlipidemia, Coronary Artery Disease, and GERD      Long-Range Goal: Patient-Specific Goal   Start Date: 01/21/2021  Expected End Date: 01/21/2022  This Visit's Progress: On track  Priority: High  Note:   Current Barriers:  Unable to independently monitor therapeutic efficacy Unable to achieve control of appetite loss and sleep   Pharmacist Clinical Goal(s):  Patient will achieve adherence  to monitoring guidelines and medication adherence to achieve therapeutic efficacy achieve control of appetite loss as evidenced by weight gain  through collaboration with PharmD and provider.   Interventions: 1:1 collaboration with [name], MD regarding development and update of  comprehensive plan of care as evidenced by provider attestation and co-signature Inter-disciplinary care team collaboration (see longitudinal plan of care) Comprehensive medication review performed; medication list updated in electronic medical record  Hypertension (BP goal <130/80) -Controlled -Current treatment: Metoprolol succinate 50 mg 1 tablet at bedtime -Medications previously tried: amlodipine (stopped)  -Current home readings: 120/70 (6pm before statin) 11pm - 90s  -Current dietary habits: limits salt intake -Current exercise habits: limited with weakness and fatigue -Reports hypotensive/hypertensive symptoms -Educated on BP goals and benefits of medications for prevention of heart attack, stroke and kidney damage; Importance of home blood pressure monitoring; Proper BP monitoring technique; Symptoms of hypotension and importance of maintaining adequate hydration; -Counseled to monitor BP at home twice weekly, document, and provide log at future appointments -Counseled on diet and exercise extensively Collaborated with cardiology to reduce dose of metoprolol.  Hyperlipidemia: (LDL goal < 70) -Not ideally controlled -Current treatment: Atorvastatin 80 mg 1 tablet daily -Medications previously tried: none  -Current dietary patterns: not much of an appetite -Current exercise habits: unable to do much with exhaustion -Educated on Cholesterol goals;  Benefits of statin for ASCVD risk reduction; -Counseled on diet and exercise extensively Recommended to continue current medication  CAD (Goal: prevent heart events) -Controlled -Current treatment  Aspirin 81 mg 1 tablet daily Atorvastatin 80 mg 1 tablet daily -Medications previously tried: none  -Recommended to continue current medication  GERD/indigestion (Goal: minimize symptoms) -Uncontrolled -Current treatment  Tums as needed -Medications previously tried: none  -Counseled on diet and exercise  extensively Recommended trial of Pepcid for indigestion.   Health Maintenance -Vaccine gaps: influenza -Current therapy:  Acetaminophen 500 mg tablet as needed Vitamin D 1000 units daily Milk thistle 1000 mg daily Multivitamin 1 tablet daily Immune system tablet (zinc and vitamin C) daily Vitamin E daily -Educated on Cost vs benefit of each product must be carefully weighed by individual consumer Supplements may interfere with prescription drugs -Patient is satisfied with current therapy and denies issues -Recommended stopping vitamin E due to additive bleeding risk  Patient Goals/Self-Care Activities Patient will:  - take medications as prescribed check blood pressure twice weekly, document, and provide at future appointments  Follow Up Plan: The care management team will reach out to the patient again over the next 30 days.         Medication Assistance: None required.  Patient affirms current coverage meets needs.  Compliance/Adherence/Medication fill history: Care Gaps: Influenza vaccine  Star-Rating Drugs: Atorvastatin 27m - [facility]  [id number]: [phone]: [phone] Uses pill box? Yes - mon and through Sunday - AM/PM Pt endorses 90% compliance  We discussed: Current pharmacy is preferred with insurance plan and patient is satisfied with pharmacy services Patient decided to: Continue current medication management strategy  Care Plan and Follow Up Patient Decision:  Patient agrees to Care Plan and Follow-up.  Plan: The care management team will reach out to the patient again over the next 30 days.  MJeni Salles PharmD, BPoint BlankPharmacist [place]

## 2021-01-23 NOTE — ED Notes (Signed)
Pt is aware that a urine sample is needed, urinal given. 

## 2021-01-23 NOTE — Patient Instructions (Addendum)
Hi Lak[place] and [name],  It was great to get to meet you in person! Below is a summary of some of the topics we discussed.   I'll let you know when I hear back from Dr. Ethlyn Gallery and your cardiologist!  Please reach out to me if you have any questions or need anything before our follow up!  Best, Maddie  Jeni Salles, PharmD, [place] [facility]   Visit Information   Goals Addressed             This Visit's Progress    Track and Manage My Blood Pressure-Hypertension       Timeframe:  Long-Range Goal Priority:  Medium Start Date:                             Expected End Date:                       Follow Up Date 03/23/21    - check blood pressure 3 times per week - choose a place to take my blood pressure (home, clinic or office, retail store) - write blood pressure results in a log or diary    Why is this important?   You won't feel high blood pressure, but it can still hurt your blood vessels.  High blood pressure can cause heart or kidney problems. It can also cause a stroke.  Making lifestyle changes like losing a little weight or eating less salt will help.  Checking your blood pressure at home and at different times of the day can help to control blood pressure.  If the doctor prescribes medicine remember to take it the way the doctor ordered.  Call the office if you cannot afford the medicine or if there are questions about it.     Notes:        Patient Care Plan: CCM Pharmacy Care Plan     Problem Identified: Problem: Hypertension, Hyperlipidemia, Coronary Artery Disease, and GERD      Long-Range Goal: Patient-Specific Goal   Start Date: 01/21/2021  Expected End Date: 01/21/2022  This Visit's Progress: On track  Priority: High  Note:   Current Barriers:  Unable to independently monitor therapeutic efficacy Unable to achieve control of appetite loss and sleep   Pharmacist Clinical Goal(s):  Patient  will achieve adherence to monitoring guidelines and medication adherence to achieve therapeutic efficacy achieve control of appetite loss as evidenced by weight gain  through collaboration with PharmD and provider.   Interventions: 1:1 collaboration with [name], MD regarding development and update of comprehensive plan of care as evidenced by provider attestation and co-signature Inter-disciplinary care team collaboration (see longitudinal plan of care) Comprehensive medication review performed; medication list updated in electronic medical record  Hypertension (BP goal <130/80) -Controlled -Current treatment: Metoprolol succinate 50 mg 1 tablet at bedtime -Medications previously tried: amlodipine (stopped)  -Current home readings: 120/70 (6pm before statin) 11pm - 90s  -Current dietary habits: limits salt intake -Current exercise habits: limited with weakness and fatigue -Reports hypotensive/hypertensive symptoms -Educated on BP goals and benefits of medications for prevention of heart attack, stroke and kidney damage; Importance of home blood pressure monitoring; Proper BP monitoring technique; Symptoms of hypotension and importance of maintaining adequate hydration; -Counseled to monitor BP at home twice weekly, document, and provide log at future appointments -Counseled on diet and exercise extensively Collaborated with cardiology to reduce  dose of metoprolol.  Hyperlipidemia: (LDL goal < 70) -Not ideally controlled -Current treatment: Atorvastatin 80 mg 1 tablet daily -Medications previously tried: none  -Current dietary patterns: not much of an appetite -Current exercise habits: unable to do much with exhaustion -Educated on Cholesterol goals;  Benefits of statin for ASCVD risk reduction; -Counseled on diet and exercise extensively Recommended to continue current medication  CAD (Goal: prevent heart events) -Controlled -Current treatment  Aspirin 81 mg 1  tablet daily Atorvastatin 80 mg 1 tablet daily -Medications previously tried: none  -Recommended to continue current medication  GERD/indigestion (Goal: minimize symptoms) -Uncontrolled -Current treatment  Tums as needed -Medications previously tried: none  -Counseled on diet and exercise extensively Recommended trial of Pepcid for indigestion.   Health Maintenance -Vaccine gaps: influenza -Current therapy:  Acetaminophen 500 mg tablet as needed Vitamin D 1000 units daily Milk thistle 1000 mg daily Multivitamin 1 tablet daily Immune system tablet (zinc and vitamin C) daily Vitamin E daily -Educated on Cost vs benefit of each product must be carefully weighed by individual consumer Supplements may interfere with prescription drugs -Patient is satisfied with current therapy and denies issues -Recommended stopping vitamin E due to additive bleeding risk  Patient Goals/Self-Care Activities Patient will:  - take medications as prescribed check blood pressure twice weekly, document, and provide at future appointments  Follow Up Plan: The care management team will reach out to the patient again over the next 30 days.       Mr. Fell was given information about Chronic Care Management services today including:  CCM service includes personalized support from designated clinical staff supervised by his physician, including individualized plan of care and coordination with other care providers 24/7 contact phone numbers for assistance for urgent and routine care needs. Standard insurance, coinsurance, copays and deductibles apply for chronic care management only during months in which we provide at least 20 minutes of these services. Most insurances cover these services at 100%, however patients may be responsible for any copay, coinsurance and/or deductible if applicable. This service may help you avoid the need for more expensive face-to-face services. Only one practitioner may  furnish and bill the service in a calendar month. The patient may stop CCM services at any time (effective at the end of the month) by phone call to the office staff.  Patient agreed to services and verbal consent obtained.   Patient verbalizes understanding of instructions provided today and agrees to view in [place].  The pharmacy team will reach out to the patient again over the next 30 days.   Viona Gilmore, [facility]

## 2021-01-23 NOTE — H&P (Signed)
History and Physical    Lawrence Hall QMV:[id number] DOB: [birth year] DOA: 01/23/2021  PCP: [name], MD   Patient coming from: Home.  I have personally briefly reviewed patient's old medical records in [place]  Chief Complaint: Abdominal pain.  HPI: Lawrence Hall is a 75 y.o. male with medical history significant of osteoarthritis of the hands, asthma, CAD, history of MI, history of PACs, ED, hiatal hernia, history of pneumonia, history of prostate cancer, history of basal cell carcinoma the face, sleep apnea not on CPAP who is presenting to the emergency department with complaints of increased abdominal pain associated with nausea in the past 2 days which he associates to starting Megace for appetite stimulation.  No emesis, diarrhea, melena or hematochezia.  Denied dysuria, frequency or hematuria.  No fever, dysuria, sore throat, wheezing or hemoptysis.  No chest pain, palpitations, left wrist, PND, orthopnea or pitting edema of the lower extremities.  Denied polyuria, polydipsia, polyphagia or blurred vision.  ED Course: Temperature 98.3 F, pulse 78, respirations 16, BP 142/79 mmHg O2 sat 100% on room air.  Dr. Melina Copa discussed the case with Dr. Alen Blew who stated that he will evaluate in the morning.  Lab work: CBC showed a white count 9.0, hemoglobin 8.2 g/dL platelets 222.  Lipase was normal.  Sodium was 134 mmol/L.  Glucose 133 mg/dL and albumin was 3.1 g/dL.  The rest of the CMP values were normal after calcium was corrected to low albumin level.  Imaging: CT chest/abdomen/pelvis with contrast showed enlarging hepatic metastasis.  Does have small amount of perihepatic ascites.  There is cholelithiasis.  Aortic and coronary atherosclerosis.  Mild cardiomegaly.  Left nephrectomy.  Please see images and full radiology report for further details.   Review of Systems: As per HPI otherwise all other systems reviewed and are negative.  Past Medical History:  Diagnosis Date    Arthritis    mild hands   Asthma    pt denies 4/21    Chest pain 07/25/2012   Coronary artery disease    Dysrhythmia    PAC'S, hx of atrial fib after OHS 04/2018   ED (erectile dysfunction)    H/O hiatal hernia    Heart murmur    Hypercholesterolemia    Hypertension    MARGINALLY HIGH - NO MEDS -MONITORS   Metastatic cancer to bone ([place]) dx'd 07/2020   rt humerus   Myocardial infarction ([place])    2017    Pneumonia    hx of 2018    PONV (postoperative nausea and vomiting)    Prostate cancer ([place]) 03/27/2011   prostate bx=adenocarcinoma,   Skin cancer 2008   basal cell face /removed   Sleep apnea    borderline sleep apnea per patient no devices    Urothelial cancer ([place]) dx'd 05/2019   left renal ca    Past Surgical History:  Procedure Laterality Date   AORTIC VALVE REPLACEMENT N/A 04/29/2018   Procedure: AORTIC VALVE REPLACEMENT (AVR) using INSPIRIS RESILIA  AORTIC VALVE 60mm;  Surgeon: Ivin Poot, MD;  Location: [place];  Service: Open Heart Surgery;  Laterality: N/A;   basal cell cancer  2008   inside nose   CARDIAC CATHETERIZATION N/A 06/03/2015   Procedure: Left Heart Cath and Coronary Angiography;  Surgeon: [name] Sine, MD;  Location: [facility];  Service: Cardiovascular;  Laterality: N/A;   CATARACT EXTRACTION, BILATERAL  2007   CORONARY ARTERY BYPASS GRAFT N/A 04/29/2018   Procedure:  CORONARY ARTERY BYPASS GRAFTING (CABG) X 3; Using Left Internal Mammary Artery (LIMA) and Right Leg Greater Saphenous Vein Graft (SVG);  LIMA to LAD, SVG to OM1, SVG to RCA,;  Surgeon: Ivin Poot, MD;  Location: [facility];  Service: Open Heart Surgery;  Laterality: N/A;   CYSTOSCOPY/RETROGRADE/URETEROSCOPY Left 08/21/2019   Procedure: CYSTOSCOPY/RETROGRADE/URETEROSCOPY/  LEFT BIOPSY/BRUSH BIOPSY/RENAL WASHINGS/ AND STENT PLACEMENT;  Surgeon: Raynelle Bring, MD;  Location: [facility];  Service: Urology;  Laterality: Left;   IR IMAGING GUIDED PORT INSERTION  09/28/2019   RIGHT/LEFT  HEART CATH AND CORONARY ANGIOGRAPHY N/A 04/26/2018   Procedure: RIGHT/LEFT HEART CATH AND CORONARY ANGIOGRAPHY;  Surgeon: [name] Sine, MD;  Location: [facility];  Service: Cardiovascular;  Laterality: N/A;   ROBOT ASSISTED LAPAROSCOPIC RADICAL PROSTATECTOMY  08/10/2011   Procedure: ROBOTIC ASSISTED LAPAROSCOPIC RADICAL PROSTATECTOMY LEVEL 2;  Surgeon: Dutch Gray, MD;  Location: [facility];  Service: Urology;  Laterality: N/A;   ROBOT ASSITED LAPAROSCOPIC NEPHROURETERECTOMY Left 01/04/2020   Procedure: XI ROBOT ASSITED LAPAROSCOPIC NEPHROURETERECTOMY;  Surgeon: Raynelle Bring, MD;  Location[facility];  Service: Urology;  Laterality: Left;  NEEDS 240 MIN FOR PROCEDURE   TEE WITHOUT CARDIOVERSION N/A 04/29/2018   Procedure: TRANSESOPHAGEAL ECHOCARDIOGRAM (TEE);  Surgeon: Prescott Gum, Collier Salina, MD;  Location: [facility];  Service: Open Heart Surgery;  Laterality: N/A;   THYROID SURGERY  infant   uncertain details; no thyroid replacement listed   WISDOM TOOTH EXTRACTION      Social History  reports that he has never smoked. He has never used smokeless tobacco. He reports that he does not currently use alcohol. He reports that he does not use drugs.  Allergies  Allergen Reactions   Other Itching and Other (See Comments)    Pet dander, seasonal allergies = Runny nose, itchy eyes    Family History  Problem Relation Age of Onset   Prostate cancer Father 22       in his 70's/other comorbidities/79 deceased   Alcohol abuse Father    Liver disease Father    Heart attack Father 69   Breast cancer Mother 85       mets to bone,deceased age 62   Cervical cancer Sister    Cervical cancer Sister    Breast cancer Sister    CAD Brother        Died of sudden cardiac death/MI   Prior to Admission medications   Medication Sig Start Date End Date Taking? Authorizing Provider  acetaminophen (TYLENOL) 500 MG tablet Take 500 mg by mouth as needed for moderate pain.    [provider]  aspirin EC 81 MG  tablet Take 1 tablet (81 mg total) by mouth daily. Swallow whole. 01/24/20   [name], [name], MD  atorvastatin (LIPITOR) 80 MG tablet TAKE 1 TABLET ONCE DAILY AT 6 PM. 07/25/20   [name], Mihai, MD  cholecalciferol (VITAMIN D3) 25 MCG (1000 UNIT) tablet Take 1,000 Units by mouth daily.    [provider]  megestrol (MEGACE) 400 MG/10ML suspension Take 10 mLs (400 mg total) by mouth daily. 01/20/21   Wyatt Portela, MD  metoprolol succinate (TOPROL-XL) 50 MG 24 hr tablet Take 1 tablet (50 mg total) by mouth at bedtime. 02/06/20   [name], Mihai, MD  Milk Thistle 1000 MG CAPS Take 1 capsule by mouth daily.    [provider]  Multiple Vitamins-Minerals (IMMUNE SUPPORT VITAMIN C PO) Take by mouth.    [provider]  nitroGLYCERIN (NITROSTAT) 0.4 MG  SL tablet 1 TAB UNDER TONGUE AS NEEDED FOR CHEST PAIN. MAY REPEAT EVERY 5 MIN FOR A TOTAL OF 3 DOSES. 06/21/19   [name], Dani Gobble, MD   Physical Exam: Vitals:   01/23/21 1201 01/23/21 1230 01/23/21 1300 01/23/21 1408  BP: (!) 146/73 (!) 148/93 (!) 155/80 (!) 154/83  Pulse: 84 81 75 77  Resp: 16 16 18 18   Temp:      TempSrc:      SpO2: 99% 98% 97% 96%   Constitutional: Chronically ill-appearing but in NAD. Eyes: PERRL, lids and conjunctivae normal.  Sclera is mildly injected. ENMT: Mucous membranes are mildly dry.  Posterior pharynx clear of any exudate or lesions. Neck: normal, supple, no masses, no thyromegaly Respiratory: clear to auscultation bilaterally, no wheezing, no crackles. Normal respiratory effort. No accessory muscle use.  Cardiovascular: Regular rate and rhythm, no murmurs / rubs / gallops. No extremity edema. 2+ pedal pulses. No carotid bruits.  Abdomen: No distention.  Bowel sounds positive.  Soft, positive RUQ tenderness, no guarding or rebound, no masses palpated. No hepatosplenomegaly.  Musculoskeletal: Mild to moderate generalized weakness.  No clubbing / cyanosis.  Good ROM, no contractures. Normal  muscle tone.  Skin: no acute rashes rashes, lesions, ulcers on very limited dermatological examination. Neurologic: CN 2-12 grossly intact. Sensation intact, DTR normal. Strength 5/5 in all 4.  Psychiatric: Normal judgment and insight. Alert and oriented x 3. Normal mood.   Labs on Admission: I have personally reviewed following labs and imaging studies  CBC: Recent Labs  Lab 01/23/21 0915  WBC 9.0  HGB 8.2*  HCT 25.9*  MCV 83.3  PLT 163    Basic Metabolic Panel: Recent Labs  Lab 01/23/21 0915  NA 134*  K 4.2  CL 103  CO2 22  GLUCOSE 133*  BUN 20  CREATININE 1.10  CALCIUM 8.7*    GFR: Estimated Creatinine Clearance: 59.7 mL/min (by C-G formula based on SCr of 1.1 mg/dL).  Liver Function Tests: Recent Labs  Lab 01/23/21 0915  AST 29  ALT 25  ALKPHOS 91  BILITOT 1.2  PROT 7.2  ALBUMIN 3.1*    Urine analysis:    Component Value Date/Time   COLORURINE RED (A) 10/09/2019 1900   APPEARANCEUR TURBID (A) 10/09/2019 1900   LABSPEC  10/09/2019 1900    TEST NOT REPORTED DUE TO COLOR INTERFERENCE OF URINE PIGMENT   PHURINE  10/09/2019 1900    TEST NOT REPORTED DUE TO COLOR INTERFERENCE OF URINE PIGMENT   GLUCOSEU (A) 10/09/2019 1900    TEST NOT REPORTED DUE TO COLOR INTERFERENCE OF URINE PIGMENT   HGBUR (A) 10/09/2019 1900    TEST NOT REPORTED DUE TO COLOR INTERFERENCE OF URINE PIGMENT   BILIRUBINUR (A) 10/09/2019 1900    TEST NOT REPORTED DUE TO COLOR INTERFERENCE OF URINE PIGMENT   KETONESUR (A) 10/09/2019 1900    TEST NOT REPORTED DUE TO COLOR INTERFERENCE OF URINE PIGMENT   PROTEINUR (A) 10/09/2019 1900    TEST NOT REPORTED DUE TO COLOR INTERFERENCE OF URINE PIGMENT   NITRITE (A) 10/09/2019 1900    TEST NOT REPORTED DUE TO COLOR INTERFERENCE OF URINE PIGMENT   LEUKOCYTESUR (A) 10/09/2019 1900    TEST NOT REPORTED DUE TO COLOR INTERFERENCE OF URINE PIGMENT    Radiological Exams on Admission: CT Chest W Contrast  Result Date: 01/23/2021 CLINICAL DATA:   Abdominal pain with vomiting, back pain, and diarrhea. History of metastatic left renal malignancy and history prostate cancer. EXAM: CT CHEST, ABDOMEN,  AND PELVIS WITH CONTRAST TECHNIQUE: Multidetector CT imaging of the chest, abdomen and pelvis was performed following the standard protocol during bolus administration of intravenous contrast. CONTRAST:  51mL OMNIPAQUE IOHEXOL 350 MG/ML SOLN COMPARISON:  Multiple exams, including 09/03/2020 FINDINGS: CT CHEST FINDINGS Cardiovascular: Right Port-A-Cath tip: Cavoatrial junction. Coronary, aortic arch, and branch vessel atherosclerotic vascular disease. Prior CABG. Mild cardiomegaly. Ascending thoracic aortic currently measured 3.8 cm. Mediastinum/Nodes: Unremarkable Lungs/Pleura: Mild scarring inferiorly in the right middle lobe appears unchanged. There is mild dependent atelectasis in both lower lobes. Mild left upper lobe scarring anteriorly appears unchanged. No compelling findings of metastatic disease to the lungs. Musculoskeletal: Postoperative findings in the right humerus. Healing fracture of the left anterior seventh rib on image 127 series 4, new compared to the prior exam of 09/03/2020. Lower thoracic spondylosis. CT ABDOMEN PELVIS FINDINGS Hepatobiliary: Scattered ill-defined hepatic metastatic lesions are present in the liver, and appear increased in size and number compared to previous. One of the larger lesions inferiorly in the right hepatic lobe measures 9.5 by 7.4 cm, previously measured at 4.9 by 4.7 cm. Another dominant lesion in the right hepatic lobe measures 7.3 by 4.5 cm, previously measured at 3.7 by 2.7 cm. Other scattered smaller hypodense lesions in the liver are present, some of these are new compared to 09/03/2020 although admittedly the use of contrast on today's exam might increase conspicuity substantially. Adjacent to the inferior right hepatic lobe lesion there is small amount of perihepatic ascites and stranding in the adjacent  adipose tissues. Mild gallbladder wall thickening with multiple gallstones in the gallbladder. No biliary dilatation. Subtle periportal edema. Pancreas: Unremarkable Spleen: Small amount of fluid density along the medial spleen increased from prior, image 58 series 2, potentially simply a small amount of local ascites. Accessory splenic tissue noted. Adrenals/Urinary Tract: Adrenal glands unremarkable. Left nephrectomy without findings of local recurrence. Small cyst of the right kidney lower pole, technically too small to characterize at 0.8 cm in diameter, although statistically likely to be benign. No hydronephrosis or hydroureter. Urinary bladder unremarkable. Stomach/Bowel: The hepatic flexure but some of the stranding adjacent to the liver, I do not see definite findings of abnormality in the colon, with areas of mild wall thickening in this vicinity probably related to nondistention. Vascular/Lymphatic: Atherosclerosis is present, including aortoiliac atherosclerotic disease. Infrarenal left-sided IVC, a venous variant. Chronic mural thrombus along the right proximal common iliac artery. Reproductive: Prostatectomy. Other: In addition to the perihepatic ascites there is a small amount of ascites in the right lower quadrant although the adjacent appendix appears normal. Musculoskeletal: Lucent lesion inferiorly in the L4 vertebral body is probably related to two adjacent Schmorl's nodes, and is unchanged from prior. IMPRESSION: 1. Enlarging hepatic metastatic lesions. Adjacent to the dominant inferior right hepatic lobe lesion there is capsular bulging, stranding in the adjacent perihepatic adipose tissues, and a small amount of perihepatic ascites. This raises the possibility of tumor extension or mild bleeding around the capsule leading to local inflammation. If the patient has had radiation therapy to the inferior right hepatic lobe lesion than that might be an alternative cause for the stranding in the  perihepatic adipose tissue. Small amount of ascites is present along the gallbladder fossa and along the right lower paracolic gutter. There is also trace ascites along the medial margin of the spleen. 2. Currently no findings of metastatic lesions aside from those throughout the liver. 3. Healing subacute fracture of the left anterior seventh rib. 4. Cholelithiasis (cannot exclude cholecystitis given  the surrounding fluid, nuclear medicine hepatobiliary scan could help assess patency of the cystic duct if clinically warranted). 5. Other imaging findings of potential clinical significance: Aortic Atherosclerosis (ICD10-I70.0). Coronary atherosclerosis. Mild cardiomegaly. Left nephrectomy. Infrarenal left-sided IVC, a venous variant. Electronically Signed   By: Van Clines M.D.   On: 01/23/2021 12:43   CT Abdomen Pelvis W Contrast  Result Date: 01/23/2021 CLINICAL DATA:  Abdominal pain with vomiting, back pain, and diarrhea. History of metastatic left renal malignancy and history prostate cancer. EXAM: CT CHEST, ABDOMEN, AND PELVIS WITH CONTRAST TECHNIQUE: Multidetector CT imaging of the chest, abdomen and pelvis was performed following the standard protocol during bolus administration of intravenous contrast. CONTRAST:  39mL OMNIPAQUE IOHEXOL 350 MG/ML SOLN COMPARISON:  Multiple exams, including 09/03/2020 FINDINGS: CT CHEST FINDINGS Cardiovascular: Right Port-A-Cath tip: Cavoatrial junction. Coronary, aortic arch, and branch vessel atherosclerotic vascular disease. Prior CABG. Mild cardiomegaly. Ascending thoracic aortic currently measured 3.8 cm. Mediastinum/Nodes: Unremarkable Lungs/Pleura: Mild scarring inferiorly in the right middle lobe appears unchanged. There is mild dependent atelectasis in both lower lobes. Mild left upper lobe scarring anteriorly appears unchanged. No compelling findings of metastatic disease to the lungs. Musculoskeletal: Postoperative findings in the right humerus. Healing  fracture of the left anterior seventh rib on image 127 series 4, new compared to the prior exam of 09/03/2020. Lower thoracic spondylosis. CT ABDOMEN PELVIS FINDINGS Hepatobiliary: Scattered ill-defined hepatic metastatic lesions are present in the liver, and appear increased in size and number compared to previous. One of the larger lesions inferiorly in the right hepatic lobe measures 9.5 by 7.4 cm, previously measured at 4.9 by 4.7 cm. Another dominant lesion in the right hepatic lobe measures 7.3 by 4.5 cm, previously measured at 3.7 by 2.7 cm. Other scattered smaller hypodense lesions in the liver are present, some of these are new compared to 09/03/2020 although admittedly the use of contrast on today's exam might increase conspicuity substantially. Adjacent to the inferior right hepatic lobe lesion there is small amount of perihepatic ascites and stranding in the adjacent adipose tissues. Mild gallbladder wall thickening with multiple gallstones in the gallbladder. No biliary dilatation. Subtle periportal edema. Pancreas: Unremarkable Spleen: Small amount of fluid density along the medial spleen increased from prior, image 58 series 2, potentially simply a small amount of local ascites. Accessory splenic tissue noted. Adrenals/Urinary Tract: Adrenal glands unremarkable. Left nephrectomy without findings of local recurrence. Small cyst of the right kidney lower pole, technically too small to characterize at 0.8 cm in diameter, although statistically likely to be benign. No hydronephrosis or hydroureter. Urinary bladder unremarkable. Stomach/Bowel: The hepatic flexure but some of the stranding adjacent to the liver, I do not see definite findings of abnormality in the colon, with areas of mild wall thickening in this vicinity probably related to nondistention. Vascular/Lymphatic: Atherosclerosis is present, including aortoiliac atherosclerotic disease. Infrarenal left-sided IVC, a venous variant. Chronic mural  thrombus along the right proximal common iliac artery. Reproductive: Prostatectomy. Other: In addition to the perihepatic ascites there is a small amount of ascites in the right lower quadrant although the adjacent appendix appears normal. Musculoskeletal: Lucent lesion inferiorly in the L4 vertebral body is probably related to two adjacent Schmorl's nodes, and is unchanged from prior. IMPRESSION: 1. Enlarging hepatic metastatic lesions. Adjacent to the dominant inferior right hepatic lobe lesion there is capsular bulging, stranding in the adjacent perihepatic adipose tissues, and a small amount of perihepatic ascites. This raises the possibility of tumor extension or mild bleeding around the capsule  leading to local inflammation. If the patient has had radiation therapy to the inferior right hepatic lobe lesion than that might be an alternative cause for the stranding in the perihepatic adipose tissue. Small amount of ascites is present along the gallbladder fossa and along the right lower paracolic gutter. There is also trace ascites along the medial margin of the spleen. 2. Currently no findings of metastatic lesions aside from those throughout the liver. 3. Healing subacute fracture of the left anterior seventh rib. 4. Cholelithiasis (cannot exclude cholecystitis given the surrounding fluid, nuclear medicine hepatobiliary scan could help assess patency of the cystic duct if clinically warranted). 5. Other imaging findings of potential clinical significance: Aortic Atherosclerosis (ICD10-I70.0). Coronary atherosclerosis. Mild cardiomegaly. Left nephrectomy. Infrarenal left-sided IVC, a venous variant. Electronically Signed   By: Van Clines M.D.   On: 01/23/2021 12:43    EKG: Independently reviewed.   Assessment/Plan Principal Problem:   Intractable vomiting with nausea and abdominal pain In the setting of  Metastatic urothelioma  of left renal pelvis Observation/MedSurg. Continue IV  fluids. Continue hydromorphone as needed. Continue antiemetics as needed. In or outpatient follow-up with oncology.  Active Problems:   Essential hypertension Continue Toprol-XL 50 mg daily. Monitor blood pressure and heart rate.    CAD of native coronary artery Continue ASA, beta-blocker and statin. Sublingual nitroglycerin as needed.    Normocytic anemia Monitor hematocrit and hemoglobin. Transfuse as needed.    Hyponatremia In the setting of decreased oral intake. Continue normal saline infusion. Follow-up sodium level.    DVT prophylaxis: Lovenox SQ. Code Status:   Full code. Family Communication:   Disposition Plan:   Patient is from:  Home.  Anticipated DC to:  Home.  Anticipated DC date:  01/24/2021 or 01/25/2021.  Anticipated DC barriers: Clinical status. Consults called:   Admission status:  Observation/MedSurg.  Severity of Illness:  High severity due to intractable abdominal pain with vomiting and nausea in the setting of metastatic renal pelvis urothelialoma to liver and bone.  The patient will remain in the hospital for symptom control.  Reubin Milan MD [facility]  How to contact the [facility] Attending or Consulting provider [place] or covering provider during after hours [place], for this patient?   Check the care team in [facility] and look for a) attending/consulting [facility] provider listed and b) the [facility] team listed Log into [email or web address] and use [place]'s universal password to access. If you do not have the password, please contact the hospital operator. Locate the [facility] provider you are looking for under [facility] and page to a number that you can be directly reached. If you still have difficulty reaching the provider, please page the [facility] (Director on Call) for the Hospitalists listed on amion for assistance.  01/23/2021, 3:56 PM   This document was prepared using Dragon voice recognition software and may contain some unintended  transcription errors.

## 2021-01-24 ENCOUNTER — Other Ambulatory Visit: Payer: Self-pay | Admitting: Oncology

## 2021-01-24 ENCOUNTER — Inpatient Hospital Stay: Payer: PPO

## 2021-01-24 ENCOUNTER — Inpatient Hospital Stay: Payer: PPO | Admitting: Oncology

## 2021-01-24 ENCOUNTER — Other Ambulatory Visit: Payer: PPO

## 2021-01-24 DIAGNOSIS — R627 Adult failure to thrive: Secondary | ICD-10-CM

## 2021-01-24 DIAGNOSIS — C787 Secondary malignant neoplasm of liver and intrahepatic bile duct: Principal | ICD-10-CM

## 2021-01-24 DIAGNOSIS — Z20822 Contact with and (suspected) exposure to covid-19: Secondary | ICD-10-CM | POA: Diagnosis present

## 2021-01-24 DIAGNOSIS — R197 Diarrhea, unspecified: Secondary | ICD-10-CM

## 2021-01-24 DIAGNOSIS — I252 Old myocardial infarction: Secondary | ICD-10-CM | POA: Diagnosis not present

## 2021-01-24 DIAGNOSIS — C7951 Secondary malignant neoplasm of bone: Secondary | ICD-10-CM | POA: Diagnosis present

## 2021-01-24 DIAGNOSIS — Z952 Presence of prosthetic heart valve: Secondary | ICD-10-CM | POA: Diagnosis not present

## 2021-01-24 DIAGNOSIS — Z79899 Other long term (current) drug therapy: Secondary | ICD-10-CM | POA: Diagnosis not present

## 2021-01-24 DIAGNOSIS — I2510 Atherosclerotic heart disease of native coronary artery without angina pectoris: Secondary | ICD-10-CM

## 2021-01-24 DIAGNOSIS — E785 Hyperlipidemia, unspecified: Secondary | ICD-10-CM | POA: Diagnosis present

## 2021-01-24 DIAGNOSIS — C652 Malignant neoplasm of left renal pelvis: Secondary | ICD-10-CM | POA: Diagnosis not present

## 2021-01-24 DIAGNOSIS — R634 Abnormal weight loss: Secondary | ICD-10-CM

## 2021-01-24 DIAGNOSIS — R112 Nausea with vomiting, unspecified: Secondary | ICD-10-CM | POA: Diagnosis present

## 2021-01-24 DIAGNOSIS — M19042 Primary osteoarthritis, left hand: Secondary | ICD-10-CM | POA: Diagnosis present

## 2021-01-24 DIAGNOSIS — N1831 Chronic kidney disease, stage 3a: Secondary | ICD-10-CM | POA: Diagnosis present

## 2021-01-24 DIAGNOSIS — M19041 Primary osteoarthritis, right hand: Secondary | ICD-10-CM | POA: Diagnosis present

## 2021-01-24 DIAGNOSIS — D649 Anemia, unspecified: Secondary | ICD-10-CM | POA: Diagnosis not present

## 2021-01-24 DIAGNOSIS — C688 Malignant neoplasm of overlapping sites of urinary organs: Secondary | ICD-10-CM | POA: Diagnosis not present

## 2021-01-24 DIAGNOSIS — K449 Diaphragmatic hernia without obstruction or gangrene: Secondary | ICD-10-CM | POA: Diagnosis present

## 2021-01-24 DIAGNOSIS — G4733 Obstructive sleep apnea (adult) (pediatric): Secondary | ICD-10-CM | POA: Diagnosis present

## 2021-01-24 DIAGNOSIS — Z905 Acquired absence of kidney: Secondary | ICD-10-CM | POA: Diagnosis not present

## 2021-01-24 DIAGNOSIS — Z8701 Personal history of pneumonia (recurrent): Secondary | ICD-10-CM | POA: Diagnosis not present

## 2021-01-24 DIAGNOSIS — E871 Hypo-osmolality and hyponatremia: Secondary | ICD-10-CM | POA: Diagnosis present

## 2021-01-24 DIAGNOSIS — E860 Dehydration: Secondary | ICD-10-CM | POA: Diagnosis present

## 2021-01-24 DIAGNOSIS — Z85828 Personal history of other malignant neoplasm of skin: Secondary | ICD-10-CM | POA: Diagnosis not present

## 2021-01-24 DIAGNOSIS — Z85528 Personal history of other malignant neoplasm of kidney: Secondary | ICD-10-CM | POA: Diagnosis not present

## 2021-01-24 DIAGNOSIS — Z8546 Personal history of malignant neoplasm of prostate: Secondary | ICD-10-CM | POA: Diagnosis not present

## 2021-01-24 DIAGNOSIS — I129 Hypertensive chronic kidney disease with stage 1 through stage 4 chronic kidney disease, or unspecified chronic kidney disease: Secondary | ICD-10-CM | POA: Diagnosis present

## 2021-01-24 DIAGNOSIS — D630 Anemia in neoplastic disease: Secondary | ICD-10-CM | POA: Diagnosis present

## 2021-01-24 DIAGNOSIS — I10 Essential (primary) hypertension: Secondary | ICD-10-CM | POA: Diagnosis not present

## 2021-01-24 MED ORDER — CHLORHEXIDINE GLUCONATE CLOTH 2 % EX PADS
6.0000 | MEDICATED_PAD | Freq: Every day | CUTANEOUS | Status: DC
  Administered 2021-01-24 – 2021-01-26 (×3): 6 via TOPICAL

## 2021-01-24 NOTE — Progress Notes (Signed)
PROGRESS NOTE    Lawrence Hall  HER:[id number] DOB: 25-Jul-1945 DOA: 01/23/2021 PCP: [name], MD    Brief Narrative:  Lawrence Hall was admitted to the hospital with the working diagnosis of intractable back pain, in the setting of metastatic urothelial carcinoma under chemotherapy.   75 year old male past medical history for asthma, coronary artery disease, prostate cancer, basal cell carcinoma of the face, obstructive sleep apnea in metastatic urothelial carcinoma, who presented with abdominal pain.  Reported 2 days of abdominal pain associated with nausea.  Because of persistent symptoms she came to the hospital for further evaluation.  On his initial physical examination his temperature was 98.3, heart rate 78, respiratory rate 16, blood pressure 142/79, oxygen saturation 100% on room air.  He had dry mucous membranes, his lungs are clear to auscultation bilaterally, heart S1-S2, present, rhythmic, abdomen tender to palpation right upper quadrant, no lower extremity edema.  Sodium 134, potassium 4.2, chloride 103, bicarb 22, glucose 133, BUN 20, creatinine 1.1.  Lipase 32, AST 29, LT 25, white count 9.0, hemoglobin 8.2, hematocrit 25.9, platelets 222.  CT chest, abdomen pelvis.  Enlarging hepatic metastatic lesions.  Small amount of ascites.  Cholelithiasis.  Assessment & Plan:   Principal Problem:   Intractable vomiting with nausea Active Problems:   Essential hypertension   CAD (coronary artery disease), native coronary artery   Malignant tumor of renal pelvis, left (HCC)   Normocytic anemia   Hyponatremia   Intractable nausea and vomiting   Intractable pain/ metastatic urothelial cancer/ liver metastasis. Patient this pm with improvement in pain but not back to baseline.  No nausea or vomiting, with antiemetic therapy.   Plan. Continue with hydromorphone 1 mg for severe pain and oxycodone 5 mg for moderate pain. Out of bed to chair tid with meals Bowel regimen with  miralax.   Follow up with oncology as outpatient for chemotherapy.   2. HTN/ dyslipidemia. Continue blood pressure control with metoprolol. Continue with atorvastatin.   3. CAD. No chest pain, continue with aspirin, and statin.   4. Chronic anemia. Malignancy related. Hgn stable at 8,2/   5. Hyponatremia. Na is 134, K 4.2 and renal function stable with serum cr 1,10.  Continue IV fluids for 24 hrs more.   Patient continue to be at high risk for worsening pain.   Status is: Inpatient  Remains inpatient appropriate because:Inpatient level of care appropriate due to severity of illness  Dispo: The patient is from: Home              Anticipated d/c is to: Home              Patient currently is not medically stable to d/c.   Difficult to place patient No  DVT prophylaxis: Enoxaprin   Code Status:    full  Family Communication:  No family at the bedside    Consultants:  Oncology    Subjective: Patient with improved pain but not back to baseline, no nausea and tolerating po .   Objective: Vitals:   01/23/21 1300 01/23/21 1408 01/23/21 2142 01/24/21 0931  BP: (!) 155/80 (!) 154/83 119/64 (!) 152/71  Pulse: 75 77 75 97  Resp: 18 18 18 20   Temp:   98.1 F (36.7 C) 98.1 F (36.7 C)  TempSrc:   Oral Oral  SpO2: 97% 96% 98% 98%    Intake/Output Summary (Last 24 hours) at 01/24/2021 1443 Last data filed at 01/24/2021 0315 Gross per 24 hour  Intake  626.02 ml  Output 1200 ml  Net -573.98 ml   There were no vitals filed for this visit.  Examination:   General: Not in pain or dyspnea.  Neurology: Awake and alert, non focal  E ENT:  positive pallor, no icterus, oral mucosa moist Cardiovascular: No JVD. S1-S2 present, rhythmic, no gallops, rubs, or murmurs. No lower extremity edema. Pulmonary: positive breath sounds bilaterally, adequate air movement, no wheezing, rhonchi or rales. Gastrointestinal. Abdomen soft and non tender Skin. No rashes Musculoskeletal: no joint  deformities     Data Reviewed: I have personally reviewed following labs and imaging studies  CBC: Recent Labs  Lab 01/23/21 0915  WBC 9.0  HGB 8.2*  HCT 25.9*  MCV 83.3  PLT 676   Basic Metabolic Panel: Recent Labs  Lab 01/23/21 0915  NA 134*  K 4.2  CL 103  CO2 22  GLUCOSE 133*  BUN 20  CREATININE 1.10  CALCIUM 8.7*   GFR: Estimated Creatinine Clearance: 59.7 mL/min (by C-G formula based on SCr of 1.1 mg/dL). Liver Function Tests: Recent Labs  Lab 01/23/21 0915  AST 29  ALT 25  ALKPHOS 91  BILITOT 1.2  PROT 7.2  ALBUMIN 3.1*   Recent Labs  Lab 01/23/21 0915  LIPASE 32   No results for input(s): AMMONIA in the last 168 hours. Coagulation Profile: No results for input(s): INR, PROTIME in the last 168 hours. Cardiac Enzymes: No results for input(s): CKTOTAL, CKMB, CKMBINDEX, TROPONINI in the last 168 hours. BNP (last 3 results) No results for input(s): PROBNP in the last 8760 hours. HbA1C: No results for input(s): HGBA1C in the last 72 hours. CBG: No results for input(s): GLUCAP in the last 168 hours. Lipid Profile: No results for input(s): CHOL, HDL, LDLCALC, TRIG, CHOLHDL, LDLDIRECT in the last 72 hours. Thyroid Function Tests: No results for input(s): TSH, T4TOTAL, FREET4, T3FREE, THYROIDAB in the last 72 hours. Anemia Panel: No results for input(s): VITAMINB12, FOLATE, FERRITIN, TIBC, IRON, RETICCTPCT in the last 72 hours.    Radiology Studies: I have reviewed all of the imaging during this hospital visit personally     Scheduled Meds:  aspirin EC  81 mg Oral Daily   atorvastatin  80 mg Oral Daily   Chlorhexidine Gluconate Cloth  6 each Topical Daily   enoxaparin (LOVENOX) injection  40 mg Subcutaneous Q24H   metoprolol succinate  50 mg Oral QHS   senna-docusate  1 tablet Oral BID   Continuous Infusions:  sodium chloride Stopped (01/23/21 2109)   promethazine (PHENERGAN) injection (IM or IVPB) Stopped (01/23/21 2125)     LOS: 0  days        Lawrence Maret Gerome Apley, MD

## 2021-01-24 NOTE — Progress Notes (Signed)
DISCONTINUE ON PATHWAY REGIMEN - Bladder     A cycle is every 21 days:     Pembrolizumab   **Always confirm dose/schedule in your pharmacy ordering system**  REASON: Disease Progression PRIOR TREATMENT: [id number]: Pembrolizumab 200 mg q21 Days Until Progression,  Unacceptable Toxicity, or up to 24 Months TREATMENT RESPONSE: Partial Response (PR)  START ON PATHWAY REGIMEN - Bladder     A cycle is every 28 days:     Enfortumab vedotin-ejfv   **Always confirm dose/schedule in your pharmacy ordering system**  Patient Characteristics: Advanced/Metastatic Disease, Third Line and Beyond Therapeutic Status: Advanced/Metastatic Disease Line of Therapy: Third Line and Beyond  Intent of Therapy: Non-Curative / Palliative Intent, Discussed with Patient

## 2021-01-24 NOTE — Progress Notes (Signed)
IP PROGRESS NOTE  Subjective:   Patient known to me with history of advanced renal pelvis tumor with hepatic and bone involvement.  He was hospitalized with to thrive, nausea, vomiting and diarrhea.  He received intravenous hydration and supportive management overnight with improvement in his symptoms.  He denies any shortness of breath or difficulty breathing.  He does report some abdominal discomfort and lower back pain.  He did report improvement in his symptoms overnight.  Objective:  Vital signs in last 24 hours: Temp:  [98.1 F (36.7 C)] 98.1 F (36.7 C) (10/06 2142) Pulse Rate:  [68-84] 75 (10/06 2142) Resp:  [16-18] 18 (10/06 2142) BP: (119-177)/(64-93) 119/64 (10/06 2142) SpO2:  [96 %-100 %] 98 % (10/06 2142) Weight change:  Last BM Date: 01/23/21  Intake/Output from previous day: 10/06 0701 - 10/07 0700 In: 626 [P.O.:120; I.V.:456; IV Piggyback:50] Out: 1200 [Urine:1200] General: Alert, awake without distress. Head: Normocephalic atraumatic. Mouth: mucous membranes moist, pharynx normal without lesions Eyes: No scleral icterus.  Pupils are equal and round reactive to light. Resp: clear to auscultation bilaterally without rhonchi or wheezes or dullness to percussion. Cardio: regular rate and rhythm, S1, S2 normal, no murmur, click, rub or gallop GI: soft, non-tender; bowel sounds normal; no masses,  no organomegaly Musculoskeletal: No joint deformity or effusion. Neurological: No motor, sensory deficits.  Intact deep tendon reflexes. Skin: No rashes or lesions.  Portacath accessed without any difficulties.  Lab Results: Recent Labs    01/23/21 0915  WBC 9.0  HGB 8.2*  HCT 25.9*  PLT 222    BMET Recent Labs    01/23/21 0915  NA 134*  K 4.2  CL 103  CO2 22  GLUCOSE 133*  BUN 20  CREATININE 1.10  CALCIUM 8.7*    Studies/Results: CT Chest W Contrast  Result Date: 01/23/2021 CLINICAL DATA:  Abdominal pain with vomiting, back pain, and diarrhea.  History of metastatic left renal malignancy and history prostate cancer. EXAM: CT CHEST, ABDOMEN, AND PELVIS WITH CONTRAST TECHNIQUE: Multidetector CT imaging of the chest, abdomen and pelvis was performed following the standard protocol during bolus administration of intravenous contrast. CONTRAST:  18mL OMNIPAQUE IOHEXOL 350 MG/ML SOLN COMPARISON:  Multiple exams, including 09/03/2020 FINDINGS: CT CHEST FINDINGS Cardiovascular: Right Port-A-Cath tip: Cavoatrial junction. Coronary, aortic arch, and branch vessel atherosclerotic vascular disease. Prior CABG. Mild cardiomegaly. Ascending thoracic aortic currently measured 3.8 cm. Mediastinum/Nodes: Unremarkable Lungs/Pleura: Mild scarring inferiorly in the right middle lobe appears unchanged. There is mild dependent atelectasis in both lower lobes. Mild left upper lobe scarring anteriorly appears unchanged. No compelling findings of metastatic disease to the lungs. Musculoskeletal: Postoperative findings in the right humerus. Healing fracture of the left anterior seventh rib on image 127 series 4, new compared to the prior exam of 09/03/2020. Lower thoracic spondylosis. CT ABDOMEN PELVIS FINDINGS Hepatobiliary: Scattered ill-defined hepatic metastatic lesions are present in the liver, and appear increased in size and number compared to previous. One of the larger lesions inferiorly in the right hepatic lobe measures 9.5 by 7.4 cm, previously measured at 4.9 by 4.7 cm. Another dominant lesion in the right hepatic lobe measures 7.3 by 4.5 cm, previously measured at 3.7 by 2.7 cm. Other scattered smaller hypodense lesions in the liver are present, some of these are new compared to 09/03/2020 although admittedly the use of contrast on today's exam might increase conspicuity substantially. Adjacent to the inferior right hepatic lobe lesion there is small amount of perihepatic ascites and stranding in the adjacent  adipose tissues. Mild gallbladder wall thickening with  multiple gallstones in the gallbladder. No biliary dilatation. Subtle periportal edema. Pancreas: Unremarkable Spleen: Small amount of fluid density along the medial spleen increased from prior, image 58 series 2, potentially simply a small amount of local ascites. Accessory splenic tissue noted. Adrenals/Urinary Tract: Adrenal glands unremarkable. Left nephrectomy without findings of local recurrence. Small cyst of the right kidney lower pole, technically too small to characterize at 0.8 cm in diameter, although statistically likely to be benign. No hydronephrosis or hydroureter. Urinary bladder unremarkable. Stomach/Bowel: The hepatic flexure but some of the stranding adjacent to the liver, I do not see definite findings of abnormality in the colon, with areas of mild wall thickening in this vicinity probably related to nondistention. Vascular/Lymphatic: Atherosclerosis is present, including aortoiliac atherosclerotic disease. Infrarenal left-sided IVC, a venous variant. Chronic mural thrombus along the right proximal common iliac artery. Reproductive: Prostatectomy. Other: In addition to the perihepatic ascites there is a small amount of ascites in the right lower quadrant although the adjacent appendix appears normal. Musculoskeletal: Lucent lesion inferiorly in the L4 vertebral body is probably related to two adjacent Schmorl's nodes, and is unchanged from prior. IMPRESSION: 1. Enlarging hepatic metastatic lesions. Adjacent to the dominant inferior right hepatic lobe lesion there is capsular bulging, stranding in the adjacent perihepatic adipose tissues, and a small amount of perihepatic ascites. This raises the possibility of tumor extension or mild bleeding around the capsule leading to local inflammation. If the patient has had radiation therapy to the inferior right hepatic lobe lesion than that might be an alternative cause for the stranding in the perihepatic adipose tissue. Small amount of ascites is  present along the gallbladder fossa and along the right lower paracolic gutter. There is also trace ascites along the medial margin of the spleen. 2. Currently no findings of metastatic lesions aside from those throughout the liver. 3. Healing subacute fracture of the left anterior seventh rib. 4. Cholelithiasis (cannot exclude cholecystitis given the surrounding fluid, nuclear medicine hepatobiliary scan could help assess patency of the cystic duct if clinically warranted). 5. Other imaging findings of potential clinical significance: Aortic Atherosclerosis (ICD10-I70.0). Coronary atherosclerosis. Mild cardiomegaly. Left nephrectomy. Infrarenal left-sided IVC, a venous variant. Electronically Signed   By: Van Clines M.D.   On: 01/23/2021 12:43   CT Abdomen Pelvis W Contrast  Result Date: 01/23/2021 CLINICAL DATA:  Abdominal pain with vomiting, back pain, and diarrhea. History of metastatic left renal malignancy and history prostate cancer. EXAM: CT CHEST, ABDOMEN, AND PELVIS WITH CONTRAST TECHNIQUE: Multidetector CT imaging of the chest, abdomen and pelvis was performed following the standard protocol during bolus administration of intravenous contrast. CONTRAST:  67mL OMNIPAQUE IOHEXOL 350 MG/ML SOLN COMPARISON:  Multiple exams, including 09/03/2020 FINDINGS: CT CHEST FINDINGS Cardiovascular: Right Port-A-Cath tip: Cavoatrial junction. Coronary, aortic arch, and branch vessel atherosclerotic vascular disease. Prior CABG. Mild cardiomegaly. Ascending thoracic aortic currently measured 3.8 cm. Mediastinum/Nodes: Unremarkable Lungs/Pleura: Mild scarring inferiorly in the right middle lobe appears unchanged. There is mild dependent atelectasis in both lower lobes. Mild left upper lobe scarring anteriorly appears unchanged. No compelling findings of metastatic disease to the lungs. Musculoskeletal: Postoperative findings in the right humerus. Healing fracture of the left anterior seventh rib on image 127  series 4, new compared to the prior exam of 09/03/2020. Lower thoracic spondylosis. CT ABDOMEN PELVIS FINDINGS Hepatobiliary: Scattered ill-defined hepatic metastatic lesions are present in the liver, and appear increased in size and number compared to previous. One of  the larger lesions inferiorly in the right hepatic lobe measures 9.5 by 7.4 cm, previously measured at 4.9 by 4.7 cm. Another dominant lesion in the right hepatic lobe measures 7.3 by 4.5 cm, previously measured at 3.7 by 2.7 cm. Other scattered smaller hypodense lesions in the liver are present, some of these are new compared to 09/03/2020 although admittedly the use of contrast on today's exam might increase conspicuity substantially. Adjacent to the inferior right hepatic lobe lesion there is small amount of perihepatic ascites and stranding in the adjacent adipose tissues. Mild gallbladder wall thickening with multiple gallstones in the gallbladder. No biliary dilatation. Subtle periportal edema. Pancreas: Unremarkable Spleen: Small amount of fluid density along the medial spleen increased from prior, image 58 series 2, potentially simply a small amount of local ascites. Accessory splenic tissue noted. Adrenals/Urinary Tract: Adrenal glands unremarkable. Left nephrectomy without findings of local recurrence. Small cyst of the right kidney lower pole, technically too small to characterize at 0.8 cm in diameter, although statistically likely to be benign. No hydronephrosis or hydroureter. Urinary bladder unremarkable. Stomach/Bowel: The hepatic flexure but some of the stranding adjacent to the liver, I do not see definite findings of abnormality in the colon, with areas of mild wall thickening in this vicinity probably related to nondistention. Vascular/Lymphatic: Atherosclerosis is present, including aortoiliac atherosclerotic disease. Infrarenal left-sided IVC, a venous variant. Chronic mural thrombus along the right proximal common iliac artery.  Reproductive: Prostatectomy. Other: In addition to the perihepatic ascites there is a small amount of ascites in the right lower quadrant although the adjacent appendix appears normal. Musculoskeletal: Lucent lesion inferiorly in the L4 vertebral body is probably related to two adjacent Schmorl's nodes, and is unchanged from prior. IMPRESSION: 1. Enlarging hepatic metastatic lesions. Adjacent to the dominant inferior right hepatic lobe lesion there is capsular bulging, stranding in the adjacent perihepatic adipose tissues, and a small amount of perihepatic ascites. This raises the possibility of tumor extension or mild bleeding around the capsule leading to local inflammation. If the patient has had radiation therapy to the inferior right hepatic lobe lesion than that might be an alternative cause for the stranding in the perihepatic adipose tissue. Small amount of ascites is present along the gallbladder fossa and along the right lower paracolic gutter. There is also trace ascites along the medial margin of the spleen. 2. Currently no findings of metastatic lesions aside from those throughout the liver. 3. Healing subacute fracture of the left anterior seventh rib. 4. Cholelithiasis (cannot exclude cholecystitis given the surrounding fluid, nuclear medicine hepatobiliary scan could help assess patency of the cystic duct if clinically warranted). 5. Other imaging findings of potential clinical significance: Aortic Atherosclerosis (ICD10-I70.0). Coronary atherosclerosis. Mild cardiomegaly. Left nephrectomy. Infrarenal left-sided IVC, a venous variant. Electronically Signed   By: Van Clines M.D.   On: 01/23/2021 12:43    Medications: I have reviewed the patient's current medications.  Assessment/Plan:  75 year old man with:     1.  Stage IV high-grade urothelial carcinoma with hepatic and bone involvement diagnosed in 2021.   CT scan obtained on January 23, 2021 was personally reviewed which showed  evidence of disease progression in the liver.  These results were discussed with the patient and different salvage therapy option is indicated.  Padcev therapy is approved for this particular indication and we will arrange to be started in the next week or 2 based on his wishes.  Complication associated with this treatment were reviewed today and he is agreeable to  proceed.        2.  Failure to thrive associated with diarrhea and weight loss: Likely related to malignancy and will receive supportive care at this time.     4.  Anemia: His hemoglobin is at baseline and is related to malignancy and previous treatment.  No need for transfusion at this time.        5.  Follow-up in disposition: We will arrange follow-up upon his discharge to start different salvage therapy.  I have no objection to discharge per the primary team.   35  minutes were dedicated to this visit.  50% of the time was face-to-face and the time was spent on reviewing laboratory data, imaging studies, discussing treatment options, and answering questions regarding future plan.       LOS: 0 days   [name] 01/24/2021, 7:26 AM

## 2021-01-25 DIAGNOSIS — I10 Essential (primary) hypertension: Secondary | ICD-10-CM

## 2021-01-25 LAB — BASIC METABOLIC PANEL
Anion gap: 7 (ref 5–15)
BUN: 17 mg/dL (ref 8–23)
CO2: 23 mmol/L (ref 22–32)
Calcium: 8 mg/dL — ABNORMAL LOW (ref 8.9–10.3)
Chloride: 104 mmol/L (ref 98–111)
Creatinine, Ser: 1.28 mg/dL — ABNORMAL HIGH (ref 0.61–1.24)
GFR, Estimated: 58 mL/min — ABNORMAL LOW (ref >60)
Glucose, Bld: 109 mg/dL — ABNORMAL HIGH (ref 70–99)
Potassium: 3.9 mmol/L (ref 3.5–5.1)
Sodium: 134 mmol/L — ABNORMAL LOW (ref 135–145)

## 2021-01-25 MED ORDER — POLYETHYLENE GLYCOL 3350 17 G PO PACK
17.0000 g | PACK | Freq: Two times a day (BID) | ORAL | Status: DC
  Administered 2021-01-25: 17 g via ORAL
  Filled 2021-01-25 (×2): qty 1

## 2021-01-25 MED ORDER — ACETAMINOPHEN 325 MG PO TABS
650.0000 mg | ORAL_TABLET | Freq: Four times a day (QID) | ORAL | Status: DC
  Administered 2021-01-25 – 2021-01-26 (×5): 650 mg via ORAL
  Filled 2021-01-25 (×5): qty 2

## 2021-01-25 MED ORDER — SODIUM CHLORIDE 0.9% FLUSH
10.0000 mL | INTRAVENOUS | Status: DC | PRN
  Administered 2021-01-26: 10 mL

## 2021-01-25 MED ORDER — HYDROMORPHONE HCL 2 MG PO TABS: 1.0000 mg | ORAL_TABLET | ORAL | Status: DC | PRN

## 2021-01-25 MED ORDER — DOCUSATE SODIUM 100 MG PO CAPS
100.0000 mg | ORAL_CAPSULE | Freq: Every day | ORAL | Status: DC | PRN
  Administered 2021-01-26: 100 mg via ORAL
  Filled 2021-01-25: qty 1

## 2021-01-25 NOTE — Plan of Care (Signed)

## 2021-01-25 NOTE — Plan of Care (Signed)
  Problem: Clinical Measurements: Goal: Respiratory complications will improve Outcome: Progressing   Problem: Clinical Measurements: Goal: Cardiovascular complication will be avoided Outcome: Progressing   Problem: Elimination: Goal: Will not experience complications related to bowel motility Outcome: Progressing   Problem: Skin Integrity: Goal: Risk for impaired skin integrity will decrease Outcome: Progressing   Problem: Safety: Goal: Ability to remain free from injury will improve Outcome: Progressing

## 2021-01-25 NOTE — Progress Notes (Signed)
Report called and given to [facility], Therapist, sports. On [address] and pt to be transferred momentarily.

## 2021-01-25 NOTE — Progress Notes (Signed)
PROGRESS NOTE    MIRANDA GARBER  VQM:[id number] DOB: 1946-03-20 DOA: 01/23/2021 PCP: [name], MD    Brief Narrative:  Mr. Mark was admitted to the hospital with the working diagnosis of intractable back pain, in the setting of metastatic urothelial carcinoma under chemotherapy.    75 year old male past medical history for asthma, coronary artery disease, prostate cancer, basal cell carcinoma of the face, obstructive sleep apnea in metastatic urothelial carcinoma, who presented with abdominal pain.  Reported 2 days of abdominal pain associated with nausea.  Because of persistent symptoms she came to the hospital for further evaluation.  On his initial physical examination his temperature was 98.3, heart rate 78, respiratory rate 16, blood pressure 142/79, oxygen saturation 100% on room air.  He had dry mucous membranes, his lungs are clear to auscultation bilaterally, heart S1-S2, present, rhythmic, abdomen tender to palpation right upper quadrant, no lower extremity edema.   Sodium 134, potassium 4.2, chloride 103, bicarb 22, glucose 133, BUN 20, creatinine 1.1.  Lipase 32, AST 29, LT 25, white count 9.0, hemoglobin 8.2, hematocrit 25.9, platelets 222.   CT chest, abdomen pelvis.  Enlarging hepatic metastatic lesions.  Small amount of ascites.  Cholelithiasis.  Patient was placed on hydromorphone and oxycodone combination with improvement in his abdominal pain.  Now transitioned to oral regimen and plan for possible dc home tomorrow.    Assessment & Plan:   Principal Problem:   Intractable vomiting with nausea Active Problems:   Essential hypertension   CAD (coronary artery disease), native coronary artery   Malignant tumor of renal pelvis, left (HCC)   Normocytic anemia   Hyponatremia   Intractable nausea and vomiting   Intractable pain/ metastatic urothelial cancer/ liver metastasis.  Combination of hydromorphone IV and oral oxycodone has been effective in controlling  his abdominal pain. He has been taking both agents together.    Plan. To use hydromorphone for severe pain and oxycodone for moderate pain, will tray not to give together to avoid side effects. Change acetaminophen to scheduled every 6 hr.    Oncology will follow as outpatient for chemotherapy in 2 weeks.    2. HTN/ dyslipidemia.  On metoprolol for blood pressure control. Continue with atorvastatin.    3. CAD. Sp CABG in the past. Continue with aspirin, and statin.    4. Chronic anemia. Malignancy related.  Follow up hgb as outpatient.    5. Hyponatremia, CKD stage 3a. Base cr 1,4.  Oral intake has improved, Na is 134 and serum cr 1,28 with K at 3,9 and bicarbonate at 23. Discontinue iV fluids. Follow up renal function as outpatient    Status is: Inpatient  Remains inpatient appropriate because:Inpatient level of care appropriate due to severity of illness  Dispo: The patient is from: Home              Anticipated d/c is to: Home              Patient currently is not medically stable to d/c.   Difficult to place patient No   DVT prophylaxis: Enoxaparin  Code Status:    full  Family Communication:   I spoke with patient's wife at the bedside, we talked in detail about patient's condition, plan of care and prognosis and all questions were addressed.    Consultants:  Oncology   Subjective: Patient with improvement in abdominal pain, no nausea or vomiting, no chest pain or dyspnea. He has been taking oxycodone and hydromorphone together.  Objective: Vitals:   01/24/21 1650 01/24/21 1930 01/24/21 2037 01/25/21 0551  BP: 126/72 138/72 138/72 129/70  Pulse: 96 87 87 81  Resp: 20 18 18 16   Temp: 98.4 F (36.9 C) 98.8 F (37.1 C) 98.8 F (37.1 C) 98.2 F (36.8 C)  TempSrc: Oral Oral Oral Oral  SpO2: 97%  98% 98%  Weight:  75.1 kg    Height:  6' (1.829 m)      Intake/Output Summary (Last 24 hours) at 01/25/2021 1138 Last data filed at 01/25/2021 1106 Gross per 24  hour  Intake 1297.42 ml  Output 975 ml  Net 322.42 ml   Filed Weights   01/24/21 1930  Weight: 75.1 kg    Examination:   General: Not in pain or dyspnea  Neurology: Awake and alert, non focal  E ENT:  mild pallor, no icterus, oral mucosa moist Cardiovascular: No JVD. S1-S2 present, rhythmic, no gallops, rubs, or murmurs. No lower extremity edema. Pulmonary: positive breath sounds bilaterally, adequate air movement, no wheezing, rhonchi or rales. Gastrointestinal. Abdomen soft and non tender Skin. No rashes Musculoskeletal: no joint deformities     Data Reviewed: I have personally reviewed following labs and imaging studies  CBC: Recent Labs  Lab 01/23/21 0915  WBC 9.0  HGB 8.2*  HCT 25.9*  MCV 83.3  PLT 998   Basic Metabolic Panel: Recent Labs  Lab 01/23/21 0915 01/25/21 0545  NA 134* 134*  K 4.2 3.9  CL 103 104  CO2 22 23  GLUCOSE 133* 109*  BUN 20 17  CREATININE 1.10 1.28*  CALCIUM 8.7* 8.0*   GFR: Estimated Creatinine Clearance: 51.3 mL/min (A) (by C-G formula based on SCr of 1.28 mg/dL (H)). Liver Function Tests: Recent Labs  Lab 01/23/21 0915  AST 29  ALT 25  ALKPHOS 91  BILITOT 1.2  PROT 7.2  ALBUMIN 3.1*   Recent Labs  Lab 01/23/21 0915  LIPASE 32   No results for input(s): AMMONIA in the last 168 hours. Coagulation Profile: No results for input(s): INR, PROTIME in the last 168 hours. Cardiac Enzymes: No results for input(s): CKTOTAL, CKMB, CKMBINDEX, TROPONINI in the last 168 hours. BNP (last 3 results) No results for input(s): PROBNP in the last 8760 hours. HbA1C: No results for input(s): HGBA1C in the last 72 hours. CBG: No results for input(s): GLUCAP in the last 168 hours. Lipid Profile: No results for input(s): CHOL, HDL, LDLCALC, TRIG, CHOLHDL, LDLDIRECT in the last 72 hours. Thyroid Function Tests: No results for input(s): TSH, T4TOTAL, FREET4, T3FREE, THYROIDAB in the last 72 hours. Anemia Panel: No results for  input(s): VITAMINB12, FOLATE, FERRITIN, TIBC, IRON, RETICCTPCT in the last 72 hours.    Radiology Studies: I have reviewed all of the imaging during this hospital visit personally     Scheduled Meds:  acetaminophen  650 mg Oral Q6H   aspirin EC  81 mg Oral Daily   atorvastatin  80 mg Oral Daily   Chlorhexidine Gluconate Cloth  6 each Topical Daily   metoprolol succinate  50 mg Oral QHS   polyethylene glycol  17 g Oral BID   Continuous Infusions:  promethazine (PHENERGAN) injection (IM or IVPB) Stopped (01/23/21 2125)     LOS: 1 day        Amaurie Schreckengost Gerome Apley, MD

## 2021-01-26 MED ORDER — OXYCODONE HCL 5 MG PO TABS: 5.0000 mg | ORAL_TABLET | ORAL | 0 refills | Status: DC | PRN

## 2021-01-26 MED ORDER — HEPARIN SOD (PORK) LOCK FLUSH 100 UNIT/ML IV SOLN
500.0000 [IU] | INTRAVENOUS | Status: AC | PRN
  Administered 2021-01-26: 500 [IU]
  Filled 2021-01-26: qty 5

## 2021-01-26 MED ORDER — POLYETHYLENE GLYCOL 3350 17 G PO PACK: 17.0000 g | PACK | Freq: Every day | ORAL | 0 refills | Status: AC

## 2021-01-26 MED ORDER — HYDROMORPHONE HCL 2 MG PO TABS: 1.0000 mg | ORAL_TABLET | Freq: Four times a day (QID) | ORAL | 0 refills | Status: DC | PRN

## 2021-01-26 MED ORDER — POLYETHYLENE GLYCOL 3350 17 G PO PACK: 17.0000 g | PACK | Freq: Every day | ORAL | Status: DC

## 2021-01-26 NOTE — Plan of Care (Signed)
  Problem: Clinical Measurements: Goal: Respiratory complications will improve Outcome: Progressing   Problem: Clinical Measurements: Goal: Cardiovascular complication will be avoided Outcome: Progressing   Problem: Coping: Goal: Level of anxiety will decrease Outcome: Progressing   Problem: Elimination: Goal: Will not experience complications related to bowel motility Outcome: Progressing   Problem: Pain Managment: Goal: General experience of comfort will improve Outcome: Progressing

## 2021-01-26 NOTE — Discharge Summary (Signed)
Physician Discharge Summary  [facility] VCB:[id number] DOB: 1945-12-19 DOA: 01/23/2021  PCP: [name], MD  Admit date: 01/23/2021 Discharge date: 01/26/2021  Admitted From: Home  Disposition:  Home   Recommendations for Outpatient Follow-up and new medication changes:  Follow up with Dr. Ethlyn Gallery in 7 to 10 days.  Patient bas been placed on oxycodone and hydromorphone for pain control.  Follow up with Dr Alen Blew from [facility] as scheduled.   Home Health: no   Equipment/Devices: na    Discharge Condition: stable  CODE STATUS: full  Diet recommendation:  regular.   Brief/Interim Summary: [name] was admitted to the hospital with the working diagnosis of intractable back pain, in the setting of metastatic urothelial carcinoma under chemotherapy.    75 year old male past medical history for asthma, coronary artery disease, prostate cancer, basal cell carcinoma of the face, obstructive sleep apnea in metastatic urothelial carcinoma, who presented with abdominal pain.  Reported 2 days of abdominal pain associated with nausea.  Because of persistent symptoms he came to the hospital for further evaluation.  On his initial physical examination his temperature was 98.3, heart rate 78, respiratory rate 16, blood pressure 142/79, oxygen saturation 100% on room air.  He had dry mucous membranes, his lungs were clear to auscultation bilaterally, heart S1-S2, present, rhythmic, abdomen tender to palpation right upper quadrant, no lower extremity edema.   Sodium 134, potassium 4.2, chloride 103, bicarb 22, glucose 133, BUN 20, creatinine 1.1.  Lipase 32, AST 29, LT 25, white count 9.0, hemoglobin 8.2, hematocrit 25.9, platelets 222.   CT chest, abdomen pelvis.  Enlarging hepatic metastatic lesions.  Small amount of ascites.  Cholelithiasis.   Patient was placed on hydromorphone and oxycodone combination with improvement in his abdominal pain.   Transitioned to po analgesics with good  toleration.   Intractable pain, due to liver metastasis due to urothelial cancer.  Patient was admitted to the medical ward, placed on IV fluids and IV analgesics. Received IV hydromorphone and oral oxycodone with improvement in his abdominal pain. No further nausea or vomiting.   He was transitioned to oral hydromorphone with good toleration. Plan to follow up as outpatient with the cancer center.   2. HTN, dyslipidemia.  Blood pressure remained well controlled with metoprolol. Continue with statin therapy.   3. CAD. Sp CABG, no chest pain, continue with aspirin and satin therapy.   4. Chronic anemia due to malignancy. Hgb and Hct remained stable.   5. Hyponatremia with CKD stage 3a. Patient received IV fluids with good toleration.  Improve oral intake.  At discharge serum Na 134, with K at 3,9 and serum bicarbonate at 23.   Discharge Diagnoses:  Principal Problem:   Intractable vomiting with nausea Active Problems:   Essential hypertension   CAD (coronary artery disease), native coronary artery   Malignant tumor of renal pelvis, left (HCC)   Normocytic anemia   Hyponatremia   Intractable nausea and vomiting    Discharge Instructions   Allergies as of 01/26/2021       Reactions   Other Itching, Other (See Comments)   Pet dander, seasonal allergies = Runny nose, itchy eyes        Medication List     TAKE these medications    acetaminophen 500 MG tablet Commonly known as: TYLENOL Take 500 mg by mouth as needed for moderate pain.   aspirin EC 81 MG tablet Take 1 tablet (81 mg total) by mouth daily. Swallow whole.  atorvastatin 80 MG tablet Commonly known as: LIPITOR TAKE 1 TABLET ONCE DAILY AT 6 PM. What changed: See the new instructions.   cholecalciferol 25 MCG (1000 UNIT) tablet Commonly known as: VITAMIN D3 Take 1,000 Units by mouth daily.   HYDROmorphone 2 MG tablet Commonly known as: DILAUDID Take 0.5 tablets (1 mg total) by mouth every 6  (six) hours as needed for severe pain.   megestrol 400 MG/10ML suspension Commonly known as: MEGACE Take 10 mLs (400 mg total) by mouth daily.   metoprolol succinate 50 MG 24 hr tablet Commonly known as: TOPROL-XL Take 1 tablet (50 mg total) by mouth at bedtime.   Milk Thistle 1000 MG Caps Take 1 capsule by mouth daily.   nitroGLYCERIN 0.4 MG SL tablet Commonly known as: NITROSTAT 1 TAB UNDER TONGUE AS NEEDED FOR CHEST PAIN. MAY REPEAT EVERY 5 MIN FOR A TOTAL OF 3 DOSES. What changed: See the new instructions.   oxyCODONE 5 MG immediate release tablet Commonly known as: Oxy IR/ROXICODONE Take 1 tablet (5 mg total) by mouth every 4 (four) hours as needed for breakthrough pain or moderate pain.   polyethylene glycol 17 g packet Commonly known as: MIRALAX / GLYCOLAX Take 17 g by mouth daily.        Allergies  Allergen Reactions   Other Itching and Other (See Comments)    Pet dander, seasonal allergies = Runny nose, itchy eyes    Consultations: Oncology    Procedures/Studies: CT Chest W Contrast  Result Date: 01/23/2021 CLINICAL DATA:  Abdominal pain with vomiting, back pain, and diarrhea. History of metastatic left renal malignancy and history prostate cancer. EXAM: CT CHEST, ABDOMEN, AND PELVIS WITH CONTRAST TECHNIQUE: Multidetector CT imaging of the chest, abdomen and pelvis was performed following the standard protocol during bolus administration of intravenous contrast. CONTRAST:  57mL OMNIPAQUE IOHEXOL 350 MG/ML SOLN COMPARISON:  Multiple exams, including 09/03/2020 FINDINGS: CT CHEST FINDINGS Cardiovascular: Right Port-A-Cath tip: Cavoatrial junction. Coronary, aortic arch, and branch vessel atherosclerotic vascular disease. Prior CABG. Mild cardiomegaly. Ascending thoracic aortic currently measured 3.8 cm. Mediastinum/Nodes: Unremarkable Lungs/Pleura: Mild scarring inferiorly in the right middle lobe appears unchanged. There is mild dependent atelectasis in both lower  lobes. Mild left upper lobe scarring anteriorly appears unchanged. No compelling findings of metastatic disease to the lungs. Musculoskeletal: Postoperative findings in the right humerus. Healing fracture of the left anterior seventh rib on image 127 series 4, new compared to the prior exam of 09/03/2020. Lower thoracic spondylosis. CT ABDOMEN PELVIS FINDINGS Hepatobiliary: Scattered ill-defined hepatic metastatic lesions are present in the liver, and appear increased in size and number compared to previous. One of the larger lesions inferiorly in the right hepatic lobe measures 9.5 by 7.4 cm, previously measured at 4.9 by 4.7 cm. Another dominant lesion in the right hepatic lobe measures 7.3 by 4.5 cm, previously measured at 3.7 by 2.7 cm. Other scattered smaller hypodense lesions in the liver are present, some of these are new compared to 09/03/2020 although admittedly the use of contrast on today's exam might increase conspicuity substantially. Adjacent to the inferior right hepatic lobe lesion there is small amount of perihepatic ascites and stranding in the adjacent adipose tissues. Mild gallbladder wall thickening with multiple gallstones in the gallbladder. No biliary dilatation. Subtle periportal edema. Pancreas: Unremarkable Spleen: Small amount of fluid density along the medial spleen increased from prior, image 58 series 2, potentially simply a small amount of local ascites. Accessory splenic tissue noted. Adrenals/Urinary Tract: Adrenal glands unremarkable.  Left nephrectomy without findings of local recurrence. Small cyst of the right kidney lower pole, technically too small to characterize at 0.8 cm in diameter, although statistically likely to be benign. No hydronephrosis or hydroureter. Urinary bladder unremarkable. Stomach/Bowel: The hepatic flexure but some of the stranding adjacent to the liver, I do not see definite findings of abnormality in the colon, with areas of mild wall thickening in this  vicinity probably related to nondistention. Vascular/Lymphatic: Atherosclerosis is present, including aortoiliac atherosclerotic disease. Infrarenal left-sided IVC, a venous variant. Chronic mural thrombus along the right proximal common iliac artery. Reproductive: Prostatectomy. Other: In addition to the perihepatic ascites there is a small amount of ascites in the right lower quadrant although the adjacent appendix appears normal. Musculoskeletal: Lucent lesion inferiorly in the L4 vertebral body is probably related to two adjacent Schmorl's nodes, and is unchanged from prior. IMPRESSION: 1. Enlarging hepatic metastatic lesions. Adjacent to the dominant inferior right hepatic lobe lesion there is capsular bulging, stranding in the adjacent perihepatic adipose tissues, and a small amount of perihepatic ascites. This raises the possibility of tumor extension or mild bleeding around the capsule leading to local inflammation. If the patient has had radiation therapy to the inferior right hepatic lobe lesion than that might be an alternative cause for the stranding in the perihepatic adipose tissue. Small amount of ascites is present along the gallbladder fossa and along the right lower paracolic gutter. There is also trace ascites along the medial margin of the spleen. 2. Currently no findings of metastatic lesions aside from those throughout the liver. 3. Healing subacute fracture of the left anterior seventh rib. 4. Cholelithiasis (cannot exclude cholecystitis given the surrounding fluid, nuclear medicine hepatobiliary scan could help assess patency of the cystic duct if clinically warranted). 5. Other imaging findings of potential clinical significance: Aortic Atherosclerosis (ICD10-I70.0). Coronary atherosclerosis. Mild cardiomegaly. Left nephrectomy. Infrarenal left-sided IVC, a venous variant. Electronically Signed   By: Van Clines M.D.   On: 01/23/2021 12:43   CT Abdomen Pelvis W Contrast  Result  Date: 01/23/2021 CLINICAL DATA:  Abdominal pain with vomiting, back pain, and diarrhea. History of metastatic left renal malignancy and history prostate cancer. EXAM: CT CHEST, ABDOMEN, AND PELVIS WITH CONTRAST TECHNIQUE: Multidetector CT imaging of the chest, abdomen and pelvis was performed following the standard protocol during bolus administration of intravenous contrast. CONTRAST:  6mL OMNIPAQUE IOHEXOL 350 MG/ML SOLN COMPARISON:  Multiple exams, including 09/03/2020 FINDINGS: CT CHEST FINDINGS Cardiovascular: Right Port-A-Cath tip: Cavoatrial junction. Coronary, aortic arch, and branch vessel atherosclerotic vascular disease. Prior CABG. Mild cardiomegaly. Ascending thoracic aortic currently measured 3.8 cm. Mediastinum/Nodes: Unremarkable Lungs/Pleura: Mild scarring inferiorly in the right middle lobe appears unchanged. There is mild dependent atelectasis in both lower lobes. Mild left upper lobe scarring anteriorly appears unchanged. No compelling findings of metastatic disease to the lungs. Musculoskeletal: Postoperative findings in the right humerus. Healing fracture of the left anterior seventh rib on image 127 series 4, new compared to the prior exam of 09/03/2020. Lower thoracic spondylosis. CT ABDOMEN PELVIS FINDINGS Hepatobiliary: Scattered ill-defined hepatic metastatic lesions are present in the liver, and appear increased in size and number compared to previous. One of the larger lesions inferiorly in the right hepatic lobe measures 9.5 by 7.4 cm, previously measured at 4.9 by 4.7 cm. Another dominant lesion in the right hepatic lobe measures 7.3 by 4.5 cm, previously measured at 3.7 by 2.7 cm. Other scattered smaller hypodense lesions in the liver are present, some of these are  new compared to 09/03/2020 although admittedly the use of contrast on today's exam might increase conspicuity substantially. Adjacent to the inferior right hepatic lobe lesion there is small amount of perihepatic ascites  and stranding in the adjacent adipose tissues. Mild gallbladder wall thickening with multiple gallstones in the gallbladder. No biliary dilatation. Subtle periportal edema. Pancreas: Unremarkable Spleen: Small amount of fluid density along the medial spleen increased from prior, image 58 series 2, potentially simply a small amount of local ascites. Accessory splenic tissue noted. Adrenals/Urinary Tract: Adrenal glands unremarkable. Left nephrectomy without findings of local recurrence. Small cyst of the right kidney lower pole, technically too small to characterize at 0.8 cm in diameter, although statistically likely to be benign. No hydronephrosis or hydroureter. Urinary bladder unremarkable. Stomach/Bowel: The hepatic flexure but some of the stranding adjacent to the liver, I do not see definite findings of abnormality in the colon, with areas of mild wall thickening in this vicinity probably related to nondistention. Vascular/Lymphatic: Atherosclerosis is present, including aortoiliac atherosclerotic disease. Infrarenal left-sided IVC, a venous variant. Chronic mural thrombus along the right proximal common iliac artery. Reproductive: Prostatectomy. Other: In addition to the perihepatic ascites there is a small amount of ascites in the right lower quadrant although the adjacent appendix appears normal. Musculoskeletal: Lucent lesion inferiorly in the L4 vertebral body is probably related to two adjacent Schmorl's nodes, and is unchanged from prior. IMPRESSION: 1. Enlarging hepatic metastatic lesions. Adjacent to the dominant inferior right hepatic lobe lesion there is capsular bulging, stranding in the adjacent perihepatic adipose tissues, and a small amount of perihepatic ascites. This raises the possibility of tumor extension or mild bleeding around the capsule leading to local inflammation. If the patient has had radiation therapy to the inferior right hepatic lobe lesion than that might be an alternative  cause for the stranding in the perihepatic adipose tissue. Small amount of ascites is present along the gallbladder fossa and along the right lower paracolic gutter. There is also trace ascites along the medial margin of the spleen. 2. Currently no findings of metastatic lesions aside from those throughout the liver. 3. Healing subacute fracture of the left anterior seventh rib. 4. Cholelithiasis (cannot exclude cholecystitis given the surrounding fluid, nuclear medicine hepatobiliary scan could help assess patency of the cystic duct if clinically warranted). 5. Other imaging findings of potential clinical significance: Aortic Atherosclerosis (ICD10-I70.0). Coronary atherosclerosis. Mild cardiomegaly. Left nephrectomy. Infrarenal left-sided IVC, a venous variant. Electronically Signed   By: Van Clines M.D.   On: 01/23/2021 12:43     Subjective: Patient is feeling better, his pain is well controlled, he had night sweats yesterday but no fever.   Discharge Exam: Vitals:   01/25/21 2037 01/26/21 0507  BP: 137/84 114/68  Pulse: 75 78  Resp: 14 16  Temp: 98.7 F (37.1 C) 99.5 F (37.5 C)  SpO2: 99% 98%   Vitals:   01/25/21 0551 01/25/21 1347 01/25/21 2037 01/26/21 0507  BP: 129/70 (!) 109/50 137/84 114/68  Pulse: 81 84 75 78  Resp: 16 16 14 16   Temp: 98.2 F (36.8 C) 98 F (36.7 C) 98.7 F (37.1 C) 99.5 F (37.5 C)  TempSrc: Oral Oral Oral Oral  SpO2: 98% 97% 99% 98%  Weight:      Height:        General: Not in pain or dyspnea  Neurology: Awake and alert, non focal  E ENT: positive pallor, no icterus, oral mucosa moist Cardiovascular: No JVD. S1-S2 present, rhythmic, no gallops,  rubs, or murmurs. No lower extremity edema. Pulmonary: positive breath sounds bilaterally, adequate air movement, no wheezing, rhonchi or rales. Gastrointestinal. Abdomen soft and non tender Skin. No rashes Musculoskeletal: no joint deformities   The results of significant diagnostics from this  hospitalization (including imaging, microbiology, ancillary and laboratory) are listed below for reference.     Microbiology: No results found for this or any previous visit (from the past 240 hour(s)).   Labs: BNP (last 3 results) No results for input(s): BNP in the last 8760 hours. Basic Metabolic Panel: Recent Labs  Lab 01/23/21 0915 01/25/21 0545  NA 134* 134*  K 4.2 3.9  CL 103 104  CO2 22 23  GLUCOSE 133* 109*  BUN 20 17  CREATININE 1.10 1.28*  CALCIUM 8.7* 8.0*   Liver Function Tests: Recent Labs  Lab 01/23/21 0915  AST 29  ALT 25  ALKPHOS 91  BILITOT 1.2  PROT 7.2  ALBUMIN 3.1*   Recent Labs  Lab 01/23/21 0915  LIPASE 32   No results for input(s): AMMONIA in the last 168 hours. CBC: Recent Labs  Lab 01/23/21 0915  WBC 9.0  HGB 8.2*  HCT 25.9*  MCV 83.3  PLT 222   Cardiac Enzymes: No results for input(s): CKTOTAL, CKMB, CKMBINDEX, TROPONINI in the last 168 hours. BNP: Invalid input(s): POCBNP CBG: No results for input(s): GLUCAP in the last 168 hours. D-Dimer No results for input(s): DDIMER in the last 72 hours. Hgb A1c No results for input(s): HGBA1C in the last 72 hours. Lipid Profile No results for input(s): CHOL, HDL, LDLCALC, TRIG, CHOLHDL, LDLDIRECT in the last 72 hours. Thyroid function studies No results for input(s): TSH, T4TOTAL, T3FREE, THYROIDAB in the last 72 hours.  Invalid input(s): FREET3 Anemia work up No results for input(s): VITAMINB12, FOLATE, FERRITIN, TIBC, IRON, RETICCTPCT in the last 72 hours. Urinalysis    Component Value Date/Time   COLORURINE RED (A) 10/09/2019 1900   APPEARANCEUR TURBID (A) 10/09/2019 1900   LABSPEC  10/09/2019 1900    TEST NOT REPORTED DUE TO COLOR INTERFERENCE OF URINE PIGMENT   PHURINE  10/09/2019 1900    TEST NOT REPORTED DUE TO COLOR INTERFERENCE OF URINE PIGMENT   GLUCOSEU (A) 10/09/2019 1900    TEST NOT REPORTED DUE TO COLOR INTERFERENCE OF URINE PIGMENT   HGBUR (A) 10/09/2019  1900    TEST NOT REPORTED DUE TO COLOR INTERFERENCE OF URINE PIGMENT   BILIRUBINUR (A) 10/09/2019 1900    TEST NOT REPORTED DUE TO COLOR INTERFERENCE OF URINE PIGMENT   KETONESUR (A) 10/09/2019 1900    TEST NOT REPORTED DUE TO COLOR INTERFERENCE OF URINE PIGMENT   PROTEINUR (A) 10/09/2019 1900    TEST NOT REPORTED DUE TO COLOR INTERFERENCE OF URINE PIGMENT   NITRITE (A) 10/09/2019 1900    TEST NOT REPORTED DUE TO COLOR INTERFERENCE OF URINE PIGMENT   LEUKOCYTESUR (A) 10/09/2019 1900    TEST NOT REPORTED DUE TO COLOR INTERFERENCE OF URINE PIGMENT   Sepsis Labs Invalid input(s): PROCALCITONIN,  WBC,  LACTICIDVEN Microbiology No results found for this or any previous visit (from the past 240 hour(s)).   Time coordinating discharge: 45 minutes  SIGNED:   Tawni Millers, MD  [facility] 01/26/2021, 10:14 AM

## 2021-01-26 NOTE — Plan of Care (Addendum)
Pt to d/c home with family. No needs at time of d/c instructions and education. Rn did encourage family and pt to reach out to registered dietician at cancer center for patient lack of appetite. Rn awaiting iv team to disconnect port prior to pt going home.

## 2021-01-27 ENCOUNTER — Other Ambulatory Visit: Payer: Self-pay | Admitting: Oncology

## 2021-01-27 ENCOUNTER — Telehealth: Payer: Self-pay | Admitting: Oncology

## 2021-01-27 ENCOUNTER — Encounter: Payer: Self-pay | Admitting: Urology

## 2021-01-27 ENCOUNTER — Encounter: Payer: Self-pay | Admitting: Oncology

## 2021-01-27 ENCOUNTER — Telehealth: Payer: Self-pay

## 2021-01-27 NOTE — Telephone Encounter (Signed)
Transition Care Management Unsuccessful Follow-up Telephone Call  Date of discharge and from where:  01/26/2021  [facility]   Attempts:  1st Attempt  Reason for unsuccessful TCM follow-up call:  Unable to reach patient

## 2021-01-27 NOTE — Telephone Encounter (Signed)
Received approval from Dr Julien Nordmann for me to proceed with ordering and to let Dr Alen Blew sign order after

## 2021-01-27 NOTE — Telephone Encounter (Signed)
Sch per 10/10 inbasket,pt aware

## 2021-01-29 ENCOUNTER — Encounter: Payer: Self-pay | Admitting: Urology

## 2021-01-29 ENCOUNTER — Encounter: Payer: Self-pay | Admitting: Oncology

## 2021-01-29 NOTE — Progress Notes (Signed)
Patient reports occasional dizziness, fatigue, constipation and abdominal pain, due worsening liver cysts that caused a 4 day visit to [facility].  Meaningful use complete.  Patient notified of 1:00pm-01/30/21 telephone appointment and verbalized understanding.

## 2021-01-30 ENCOUNTER — Ambulatory Visit
Admission: RE | Admit: 2021-01-30 | Discharge: 2021-01-30 | Disposition: A | Discharge status: Home / Self Care | Payer: PPO | Source: Ambulatory Visit | Attending: Urology | Admitting: Urology

## 2021-01-30 ENCOUNTER — Encounter: Payer: Self-pay | Admitting: Oncology

## 2021-01-30 DIAGNOSIS — C7951 Secondary malignant neoplasm of bone: Secondary | ICD-10-CM

## 2021-01-30 NOTE — Telephone Encounter (Signed)
Transition Care Management Unsuccessful Follow-up Telephone Call  Date of discharge and from where:  [facility] 01/26/2021 Attempts:  2nd Attempt  Reason for unsuccessful TCM follow-up call:  Unable to reach patient

## 2021-01-30 NOTE — Progress Notes (Signed)
  Radiation Oncology         [phone] ________________________________  Name: [facility] MRN: [id number]  Date: 12/20/2020  DOB: 05-Oct-1945  End of Treatment Note  Diagnosis:   75 year old with stage IV high-grade urothelial carcinoma of the renal pelvis with bone and potentially hepatic involvement s/p resection and fixation of right humerus for metastatic disease.     Indication for treatment:  Palliative post-operative radiation       Radiation treatment dates:   12/09/20 - 12/20/20  Site/dose:   The right humerus, including the length of the hardware was treated to 30 Gy in 10 fractions.  Beams/energy:   A 3D field set-up was employed with 6 MV X-rays  Narrative: The patient tolerated radiation treatment relatively well with only mild skin irritation/dryness and swelling in his right hand.  Plan: The patient has completed radiation treatment. The patient will return to radiation oncology clinic for routine followup in one month. I advised him to call or return sooner if he has any questions or concerns related to his recovery or treatment. ________________________________  Sheral Apley. Tammi Klippel, M.D.

## 2021-01-30 NOTE — Progress Notes (Signed)
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  Interval Since Last Radiation:  6 weeks  12/09/20 - 12/20/20:  The right humerus, including the length of the hardware was treated to 30 Gy in 10 fractions.  Narrative:  I spoke with the patient to conduct his routine scheduled 1 month follow up visit via telephone to spare the patient unnecessary potential exposure in the healthcare setting during the current COVID-19 pandemic.  The patient was notified in advance and gave permission to proceed with this visit format.  He tolerated radiation treatment relatively well with only mild skin irritation/dryness and swelling in his right hand.                              On review of systems, the patient states that he continues with some swelling in the right hand and wrist but denies pain. He also has some dry, itchy skin in the radiation field but denies wound issues. He was recently seen in the ED with c/o abdominal pain, N/V and anorexia. CT C/A/P performed on admission showed progressive disease in the liver. He is currently scheduled for a follow up visit with Dr. Alen Blew on 02/05/21 in anticipation of beginning a new immunotherapy with Padcev.  He continues with significant generalized weakness, fatigue and anorexia but reports that the abdominal pain, N/V are well controlled at this time.  ALLERGIES:  is allergic to other.  Meds: Current Outpatient Medications  Medication Sig Dispense Refill   acetaminophen (TYLENOL) 500 MG tablet Take 500 mg by mouth as needed for moderate pain.     aspirin EC 81 MG tablet Take 1 tablet  (81 mg total) by mouth daily. Swallow whole. 30 tablet 11   atorvastatin (LIPITOR) 80 MG tablet TAKE 1 TABLET ONCE DAILY AT 6 PM. (Patient taking differently: Take 80 mg by mouth daily.) 90 tablet 3   cholecalciferol (VITAMIN D3) 25 MCG (1000 UNIT) tablet Take 1,000 Units by mouth daily.     HYDROmorphone (DILAUDID) 2 MG tablet Take 0.5 tablets (1 mg total) by mouth every 6 (six) hours as needed for severe pain. 10 tablet 0   megestrol (MEGACE) 400 MG/10ML suspension Take 10 mLs (400 mg total) by mouth daily. 240 mL 1   metoprolol succinate (TOPROL-XL) 50 MG 24 hr tablet Take 1 tablet (50 mg total) by mouth at bedtime. 90 tablet 3   Milk Thistle 1000 MG CAPS Take 1 capsule by mouth daily.     nitroGLYCERIN (NITROSTAT) 0.4 MG SL tablet 1 TAB UNDER TONGUE AS NEEDED FOR CHEST PAIN. MAY REPEAT EVERY 5 MIN FOR A TOTAL OF 3 DOSES. (Patient taking differently: Place 0.4 mg under the tongue every 5 (five) minutes as needed for chest pain.) 25 tablet 4   oxyCODONE (OXY IR/ROXICODONE) 5 MG immediate release tablet Take 1 tablet (5 mg total) by mouth every 4 (four) hours as needed for breakthrough pain or moderate pain. 20 tablet 0   polyethylene glycol (MIRALAX / GLYCOLAX) 17 g packet Take 17 g by mouth daily. 14 each 0   prochlorperazine (  COMPAZINE) 10 MG tablet TAKE 1 TABLET EVERY 6 HOURS AS NEEDED FOR NAUSEA & VOMITING. 30 tablet 0   No current facility-administered medications for this encounter.    Physical Findings:  vitals were not taken for this visit.   /10 Unable to assess due to telephone follow up visit format.  Lab Findings: Lab Results  Component Value Date   WBC 9.0 01/23/2021   HGB 8.2 (L) 01/23/2021   HCT 25.9 (L) 01/23/2021   MCV 83.3 01/23/2021   PLT 222 01/23/2021     Radiographic Findings: CT Chest W Contrast  Result Date: 01/23/2021 CLINICAL DATA:  Abdominal pain with vomiting, back pain, and diarrhea. History of metastatic left renal malignancy and history prostate  cancer. EXAM: CT CHEST, ABDOMEN, AND PELVIS WITH CONTRAST TECHNIQUE: Multidetector CT imaging of the chest, abdomen and pelvis was performed following the standard protocol during bolus administration of intravenous contrast. CONTRAST:  1mL OMNIPAQUE IOHEXOL 350 MG/ML SOLN COMPARISON:  Multiple exams, including 09/03/2020 FINDINGS: CT CHEST FINDINGS Cardiovascular: Right Port-A-Cath tip: Cavoatrial junction. Coronary, aortic arch, and branch vessel atherosclerotic vascular disease. Prior CABG. Mild cardiomegaly. Ascending thoracic aortic currently measured 3.8 cm. Mediastinum/Nodes: Unremarkable Lungs/Pleura: Mild scarring inferiorly in the right middle lobe appears unchanged. There is mild dependent atelectasis in both lower lobes. Mild left upper lobe scarring anteriorly appears unchanged. No compelling findings of metastatic disease to the lungs. Musculoskeletal: Postoperative findings in the right humerus. Healing fracture of the left anterior seventh rib on image 127 series 4, new compared to the prior exam of 09/03/2020. Lower thoracic spondylosis. CT ABDOMEN PELVIS FINDINGS Hepatobiliary: Scattered ill-defined hepatic metastatic lesions are present in the liver, and appear increased in size and number compared to previous. One of the larger lesions inferiorly in the right hepatic lobe measures 9.5 by 7.4 cm, previously measured at 4.9 by 4.7 cm. Another dominant lesion in the right hepatic lobe measures 7.3 by 4.5 cm, previously measured at 3.7 by 2.7 cm. Other scattered smaller hypodense lesions in the liver are present, some of these are new compared to 09/03/2020 although admittedly the use of contrast on today's exam might increase conspicuity substantially. Adjacent to the inferior right hepatic lobe lesion there is small amount of perihepatic ascites and stranding in the adjacent adipose tissues. Mild gallbladder wall thickening with multiple gallstones in the gallbladder. No biliary dilatation. Subtle  periportal edema. Pancreas: Unremarkable Spleen: Small amount of fluid density along the medial spleen increased from prior, image 58 series 2, potentially simply a small amount of local ascites. Accessory splenic tissue noted. Adrenals/Urinary Tract: Adrenal glands unremarkable. Left nephrectomy without findings of local recurrence. Small cyst of the right kidney lower pole, technically too small to characterize at 0.8 cm in diameter, although statistically likely to be benign. No hydronephrosis or hydroureter. Urinary bladder unremarkable. Stomach/Bowel: The hepatic flexure but some of the stranding adjacent to the liver, I do not see definite findings of abnormality in the colon, with areas of mild wall thickening in this vicinity probably related to nondistention. Vascular/Lymphatic: Atherosclerosis is present, including aortoiliac atherosclerotic disease. Infrarenal left-sided IVC, a venous variant. Chronic mural thrombus along the right proximal common iliac artery. Reproductive: Prostatectomy. Other: In addition to the perihepatic ascites there is a small amount of ascites in the right lower quadrant although the adjacent appendix appears normal. Musculoskeletal: Lucent lesion inferiorly in the L4 vertebral body is probably related to two adjacent Schmorl's nodes, and is unchanged from prior. IMPRESSION: 1. Enlarging hepatic metastatic lesions. Adjacent to  the dominant inferior right hepatic lobe lesion there is capsular bulging, stranding in the adjacent perihepatic adipose tissues, and a small amount of perihepatic ascites. This raises the possibility of tumor extension or mild bleeding around the capsule leading to local inflammation. If the patient has had radiation therapy to the inferior right hepatic lobe lesion than that might be an alternative cause for the stranding in the perihepatic adipose tissue. Small amount of ascites is present along the gallbladder fossa and along the right lower paracolic  gutter. There is also trace ascites along the medial margin of the spleen. 2. Currently no findings of metastatic lesions aside from those throughout the liver. 3. Healing subacute fracture of the left anterior seventh rib. 4. Cholelithiasis (cannot exclude cholecystitis given the surrounding fluid, nuclear medicine hepatobiliary scan could help assess patency of the cystic duct if clinically warranted). 5. Other imaging findings of potential clinical significance: Aortic Atherosclerosis (ICD10-I70.0). Coronary atherosclerosis. Mild cardiomegaly. Left nephrectomy. Infrarenal left-sided IVC, a venous variant. Electronically Signed   By: Van Clines M.D.   On: 01/23/2021 12:43   CT Abdomen Pelvis W Contrast  Result Date: 01/23/2021 CLINICAL DATA:  Abdominal pain with vomiting, back pain, and diarrhea. History of metastatic left renal malignancy and history prostate cancer. EXAM: CT CHEST, ABDOMEN, AND PELVIS WITH CONTRAST TECHNIQUE: Multidetector CT imaging of the chest, abdomen and pelvis was performed following the standard protocol during bolus administration of intravenous contrast. CONTRAST:  34mL OMNIPAQUE IOHEXOL 350 MG/ML SOLN COMPARISON:  Multiple exams, including 09/03/2020 FINDINGS: CT CHEST FINDINGS Cardiovascular: Right Port-A-Cath tip: Cavoatrial junction. Coronary, aortic arch, and branch vessel atherosclerotic vascular disease. Prior CABG. Mild cardiomegaly. Ascending thoracic aortic currently measured 3.8 cm. Mediastinum/Nodes: Unremarkable Lungs/Pleura: Mild scarring inferiorly in the right middle lobe appears unchanged. There is mild dependent atelectasis in both lower lobes. Mild left upper lobe scarring anteriorly appears unchanged. No compelling findings of metastatic disease to the lungs. Musculoskeletal: Postoperative findings in the right humerus. Healing fracture of the left anterior seventh rib on image 127 series 4, new compared to the prior exam of 09/03/2020. Lower thoracic  spondylosis. CT ABDOMEN PELVIS FINDINGS Hepatobiliary: Scattered ill-defined hepatic metastatic lesions are present in the liver, and appear increased in size and number compared to previous. One of the larger lesions inferiorly in the right hepatic lobe measures 9.5 by 7.4 cm, previously measured at 4.9 by 4.7 cm. Another dominant lesion in the right hepatic lobe measures 7.3 by 4.5 cm, previously measured at 3.7 by 2.7 cm. Other scattered smaller hypodense lesions in the liver are present, some of these are new compared to 09/03/2020 although admittedly the use of contrast on today's exam might increase conspicuity substantially. Adjacent to the inferior right hepatic lobe lesion there is small amount of perihepatic ascites and stranding in the adjacent adipose tissues. Mild gallbladder wall thickening with multiple gallstones in the gallbladder. No biliary dilatation. Subtle periportal edema. Pancreas: Unremarkable Spleen: Small amount of fluid density along the medial spleen increased from prior, image 58 series 2, potentially simply a small amount of local ascites. Accessory splenic tissue noted. Adrenals/Urinary Tract: Adrenal glands unremarkable. Left nephrectomy without findings of local recurrence. Small cyst of the right kidney lower pole, technically too small to characterize at 0.8 cm in diameter, although statistically likely to be benign. No hydronephrosis or hydroureter. Urinary bladder unremarkable. Stomach/Bowel: The hepatic flexure but some of the stranding adjacent to the liver, I do not see definite findings of abnormality in the colon, with areas of  mild wall thickening in this vicinity probably related to nondistention. Vascular/Lymphatic: Atherosclerosis is present, including aortoiliac atherosclerotic disease. Infrarenal left-sided IVC, a venous variant. Chronic mural thrombus along the right proximal common iliac artery. Reproductive: Prostatectomy. Other: In addition to the perihepatic  ascites there is a small amount of ascites in the right lower quadrant although the adjacent appendix appears normal. Musculoskeletal: Lucent lesion inferiorly in the L4 vertebral body is probably related to two adjacent Schmorl's nodes, and is unchanged from prior. IMPRESSION: 1. Enlarging hepatic metastatic lesions. Adjacent to the dominant inferior right hepatic lobe lesion there is capsular bulging, stranding in the adjacent perihepatic adipose tissues, and a small amount of perihepatic ascites. This raises the possibility of tumor extension or mild bleeding around the capsule leading to local inflammation. If the patient has had radiation therapy to the inferior right hepatic lobe lesion than that might be an alternative cause for the stranding in the perihepatic adipose tissue. Small amount of ascites is present along the gallbladder fossa and along the right lower paracolic gutter. There is also trace ascites along the medial margin of the spleen. 2. Currently no findings of metastatic lesions aside from those throughout the liver. 3. Healing subacute fracture of the left anterior seventh rib. 4. Cholelithiasis (cannot exclude cholecystitis given the surrounding fluid, nuclear medicine hepatobiliary scan could help assess patency of the cystic duct if clinically warranted). 5. Other imaging findings of potential clinical significance: Aortic Atherosclerosis (ICD10-I70.0). Coronary atherosclerosis. Mild cardiomegaly. Left nephrectomy. Infrarenal left-sided IVC, a venous variant. Electronically Signed   By: Van Clines M.D.   On: 01/23/2021 12:43    Impression/Plan: 69. 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 He appears to have recovered from the effects of radiation and is currently without complaints associated with this treatment aside from some  mild, persistent dry skin in the treatment field. We will leave some RadiaPlex with Dr. Hazeline Junker nurse so that they can pick this up at the time of his next visit 02/05/21. We discussed that while we are happy to continue to participate in his care if clinically indicated, we will plan to see him back on an as needed basis. He will continue in routine follow up under the care and direction of Dr. Alen Blew and we will look forward to continuing to follow his progress via correspondence. They know that they are welcome to call with any questions or concerns related to his previous radiation.    Nicholos Johns, PA-C

## 2021-01-31 ENCOUNTER — Encounter: Payer: Self-pay | Admitting: Oncology

## 2021-01-31 NOTE — Telephone Encounter (Signed)
Transition Care Management Unsuccessful Follow-up Telephone Call  Date of discharge and from where:  [facility] 01/26/2021  Attempts:  3rd Attempt  Reason for unsuccessful TCM follow-up call:  Unable to reach patient

## 2021-02-03 ENCOUNTER — Ambulatory Visit (INDEPENDENT_AMBULATORY_CARE_PROVIDER_SITE_OTHER): Payer: PPO | Admitting: Family Medicine

## 2021-02-03 ENCOUNTER — Encounter: Payer: Self-pay | Admitting: Family Medicine

## 2021-02-03 ENCOUNTER — Other Ambulatory Visit: Payer: Self-pay

## 2021-02-03 VITALS — BP 120/60 | HR 86 | Temp 98.4°F | Ht 72.0 in | Wt 161.2 lb

## 2021-02-03 DIAGNOSIS — I10 Essential (primary) hypertension: Secondary | ICD-10-CM

## 2021-02-03 DIAGNOSIS — E785 Hyperlipidemia, unspecified: Secondary | ICD-10-CM | POA: Diagnosis not present

## 2021-02-03 DIAGNOSIS — C652 Malignant neoplasm of left renal pelvis: Secondary | ICD-10-CM

## 2021-02-03 DIAGNOSIS — F321 Major depressive disorder, single episode, moderate: Secondary | ICD-10-CM

## 2021-02-03 DIAGNOSIS — R110 Nausea: Secondary | ICD-10-CM

## 2021-02-03 DIAGNOSIS — R1110 Vomiting, unspecified: Secondary | ICD-10-CM

## 2021-02-03 MED ORDER — ONDANSETRON HCL 4 MG/2ML IJ SOLN
4.0000 mg | Freq: Once | INTRAMUSCULAR | Status: AC
  Administered 2021-02-03: 4 mg via INTRAMUSCULAR

## 2021-02-03 MED ORDER — MIRTAZAPINE 15 MG PO TABS: 15.0000 mg | ORAL_TABLET | Freq: Every day | ORAL | 1 refills | Status: DC

## 2021-02-03 NOTE — Progress Notes (Signed)
Lawrence Hall DOB: 29-May-1945 Encounter date: 02/03/2021  This is a 75 y.o. male who presents with Chief Complaint  Patient presents with   Hospitalization Follow-up    History of present illness: Hard to eat - cheerios, mashed potatoes, part of boost this morning. Does get nauseated in morning if he doesn't eat enough.   Brought summary of what he has been through since last visit. 06/2019 dx cancer left kidney, had 4-5 mo chemo before surgery. Kidney removed. Then 8 mo follow up/monitoring. Then 06/2020 found ca in liver on imaging and right humerus. Had humerus surgery in June - part of bone removed. Monthly infusions after that with keytruda x 4-5 mo. 10 rounds radiation right arm after surgery and last infusion was 8/26. Was supposed to get break in treatment, but started with weight loss, everything went downward. This is lowest weight he has been at since 20's.   Present issues: appetite diminished, daily nausea, pain in back/stomach cramps. 9/26 and 10/6 ER visits. Had just started megace prior to 10/6 ER - tasted terrible - made him sick, vomiting, threw out back - in intense pain. CT repeated during hosp stay - found enlarging hepatic metastatic lesions. Consider new treatment. Has oncology visit this week. He wanted to cancel visit this week due to not feeling well; doesn't feel like he can start a new treatment at this time.   Has compazine, dilaudid/oxycodone, tylenol from hospital. Has been trying to control back pain with tylenol. If not controlled with tylenol will use oxycodone. Trying not to use dilaudid due to it being so strong.   90 percent of time with nausea starts in the morning.   Hydration issue is big issue for him. He has always had trouble with drinking water. Has been dehydrated both of his hospital visits.   Does have supply of boost and ensure around - hard to drink the whole thing when not feeling well.   Tried miralax for several days, but it didn't help. Does  have some constipation with not eating.   Using senekot, dulcolax, colace. Then if he eats ceasar salad it helps when he eats this.   Mood has been tough. The worse he feels the less patient he is. Feeling terrible and isn't kind with his words, volume, request. Feels that this is directly related to how badly he is feeling. Then feels terrible for how he is acting. Didn't feel anxious until he found out that cancer was in liver/arm. Just felt like removal of kidney was going to be the end of things.   Did monitor bp at home over last 7-8 days: in morning takes shower, shaves - pressures are usually in 140/90 and then later in day 114/72 (83 pulse) average. Lowest 109/73 in last couple of weeks.   Had discussed remeron with pharmacist. Interested in starting this. Now on compazine so we will hold off on adding more meds like pepcid.   Allergies  Allergen Reactions   Other Itching and Other (See Comments)    Pet dander, seasonal allergies = Runny nose, itchy eyes   Current Meds  Medication Sig   acetaminophen (TYLENOL) 500 MG tablet Take 500 mg by mouth as needed for moderate pain.   aspirin EC 81 MG tablet Take 1 tablet (81 mg total) by mouth daily. Swallow whole.   atorvastatin (LIPITOR) 80 MG tablet TAKE 1 TABLET ONCE DAILY AT 6 PM. (Patient taking differently: Take 80 mg by mouth daily.)   cholecalciferol (VITAMIN D3)  25 MCG (1000 UNIT) tablet Take 1,000 Units by mouth daily.   HYDROmorphone (DILAUDID) 2 MG tablet Take 0.5 tablets (1 mg total) by mouth every 6 (six) hours as needed for severe pain.   metoprolol succinate (TOPROL-XL) 50 MG 24 hr tablet Take 1 tablet (50 mg total) by mouth at bedtime.   Milk Thistle 1000 MG CAPS Take 1 capsule by mouth daily.   mirtazapine (REMERON) 15 MG tablet Take 1 tablet (15 mg total) by mouth at bedtime.   nitroGLYCERIN (NITROSTAT) 0.4 MG SL tablet 1 TAB UNDER TONGUE AS NEEDED FOR CHEST PAIN. MAY REPEAT EVERY 5 MIN FOR A TOTAL OF 3 DOSES. (Patient  taking differently: Place 0.4 mg under the tongue every 5 (five) minutes as needed for chest pain.)   oxyCODONE (OXY IR/ROXICODONE) 5 MG immediate release tablet Take 1 tablet (5 mg total) by mouth every 4 (four) hours as needed for breakthrough pain or moderate pain.   polyethylene glycol (MIRALAX / GLYCOLAX) 17 g packet Take 17 g by mouth daily.   prochlorperazine (COMPAZINE) 10 MG tablet TAKE 1 TABLET EVERY 6 HOURS AS NEEDED FOR NAUSEA & VOMITING.   [DISCONTINUED] megestrol (MEGACE) 400 MG/10ML suspension Take 10 mLs (400 mg total) by mouth daily.    Review of Systems  Constitutional:  Positive for activity change, appetite change and unexpected weight change. Negative for chills, fatigue and fever.  Respiratory:  Negative for cough, chest tightness, shortness of breath and wheezing.   Cardiovascular:  Negative for chest pain, palpitations and leg swelling.  Gastrointestinal:  Positive for nausea and vomiting.  Musculoskeletal:  Positive for back pain.  Psychiatric/Behavioral:  Positive for agitation and sleep disturbance. The patient is nervous/anxious.    Objective:  BP 120/60 (BP Location: Left Arm, Patient Position: Sitting, Cuff Size: Normal)   Pulse 86   Temp 98.4 F (36.9 C) (Oral)   Ht 6' (1.829 m)   Wt 161 lb 3.2 oz (73.1 kg)   SpO2 96%   BMI 21.86 kg/m   Weight: 161 lb 3.2 oz (73.1 kg)   BP Readings from Last 3 Encounters:  02/03/21 120/60  01/26/21 114/68  01/20/21 128/70   Wt Readings from Last 3 Encounters:  02/03/21 161 lb 3.2 oz (73.1 kg)  01/24/21 165 lb 9.6 oz (75.1 kg)  01/20/21 165 lb 9.6 oz (75.1 kg)    Physical Exam Constitutional:      General: He is not in acute distress.    Appearance: He is well-developed.  Cardiovascular:     Rate and Rhythm: Normal rate and regular rhythm.     Heart sounds: Murmur heard.  Systolic murmur is present with a grade of 2/6.    No friction rub.  Pulmonary:     Effort: Pulmonary effort is normal. No respiratory  distress.     Breath sounds: Normal breath sounds. No wheezing or rales.  Musculoskeletal:     Right lower leg: No edema.     Left lower leg: No edema.  Neurological:     Mental Status: He is alert and oriented to person, place, and time.  Psychiatric:        Attention and Perception: Attention normal.        Speech: Speech normal.        Behavior: Behavior normal.        Thought Content: Thought content normal.     Comments: Patient is irritable with wife in the office. He stops her with talking and is easily agitated  by questions she is asking. He is occasionally tearful when talking about what he is currently experiencing and then when talking about his agitation and being negative towards wife when she is trying to help him.     Assessment/Plan  1. Vomiting, unspecified vomiting type, unspecified whether nausea present Vomiting resolved with Zofran given in the office today.  We did discuss using Compazine first thing in the morning before taking other medications or eating to help with morning nausea.  It seems that his nausea does improve by afternoon. - ondansetron (ZOFRAN) injection 4 mg  2. Nausea See above  3. Depression, major, single episode, moderate (HCC) We are going to add remeron for benefit of anti-depressant as well as appetite and sleep. I have asked him to update me with any problems along the way.  We will set a close 2-week follow-up for him. - mirtazapine (REMERON) 15 MG tablet; Take 1 tablet (15 mg total) by mouth at bedtime.  Dispense: 90 tablet; Refill: 1  4. Essential hypertension Blood pressures well controlled.  May need to decrease on metoprolol if he continues to lose weight.  He is monitoring blood pressures at home.  He is considering following back up with cardiology, but as long as blood pressures are staying stable and in the current range, I feel he is okay with current medication dose.  5. Malignant tumor of renal pelvis, left [facility]) Following  regularly with oncology.  He has a follow-up visit in 2 days to discuss further treatment plans.  6. Hyperlipidemia, unspecified hyperlipidemia type Currently on Lipitor 80 mg daily.    Return in about 2 weeks (around 02/17/2021) for virtual visit is ok.  50 minutes spent in time with patient, chart review, charting, exam, follow up treatment plan, discussion of importance of hydration/suggestions for hydration/appetite at home.     [name], MD

## 2021-02-03 NOTE — Patient Instructions (Addendum)
*  take the miralax daily - just will take regular use to help with this.   *try taking the compazine first thing in the morning prior to eating/drinking. If you have afternoon nausea, then I would schedule the compazine.   *

## 2021-02-04 ENCOUNTER — Ambulatory Visit: Payer: PPO

## 2021-02-04 MED FILL — Dexamethasone Sodium Phosphate Inj 100 MG/10ML: INTRAMUSCULAR | Qty: 1 | Status: AC

## 2021-02-05 ENCOUNTER — Inpatient Hospital Stay: Payer: PPO

## 2021-02-05 ENCOUNTER — Other Ambulatory Visit: Payer: Self-pay

## 2021-02-05 ENCOUNTER — Inpatient Hospital Stay: Payer: PPO | Admitting: Oncology

## 2021-02-05 VITALS — BP 101/63 | HR 87 | Temp 98.0°F | Resp 18

## 2021-02-05 DIAGNOSIS — C652 Malignant neoplasm of left renal pelvis: Secondary | ICD-10-CM

## 2021-02-05 DIAGNOSIS — Z79899 Other long term (current) drug therapy: Secondary | ICD-10-CM | POA: Diagnosis not present

## 2021-02-05 DIAGNOSIS — Z95828 Presence of other vascular implants and grafts: Secondary | ICD-10-CM

## 2021-02-05 DIAGNOSIS — D630 Anemia in neoplastic disease: Secondary | ICD-10-CM | POA: Diagnosis not present

## 2021-02-05 DIAGNOSIS — Z5112 Encounter for antineoplastic immunotherapy: Secondary | ICD-10-CM | POA: Diagnosis not present

## 2021-02-05 DIAGNOSIS — E860 Dehydration: Secondary | ICD-10-CM | POA: Diagnosis not present

## 2021-02-05 DIAGNOSIS — Z923 Personal history of irradiation: Secondary | ICD-10-CM | POA: Diagnosis not present

## 2021-02-05 DIAGNOSIS — Z7982 Long term (current) use of aspirin: Secondary | ICD-10-CM | POA: Diagnosis not present

## 2021-02-05 DIAGNOSIS — R627 Adult failure to thrive: Secondary | ICD-10-CM | POA: Diagnosis not present

## 2021-02-05 DIAGNOSIS — D649 Anemia, unspecified: Secondary | ICD-10-CM

## 2021-02-05 DIAGNOSIS — R000 Tachycardia, unspecified: Secondary | ICD-10-CM | POA: Diagnosis not present

## 2021-02-05 DIAGNOSIS — C787 Secondary malignant neoplasm of liver and intrahepatic bile duct: Secondary | ICD-10-CM | POA: Diagnosis not present

## 2021-02-05 DIAGNOSIS — C679 Malignant neoplasm of bladder, unspecified: Secondary | ICD-10-CM

## 2021-02-05 LAB — CMP (CANCER CENTER ONLY)
ALT: 22 U/L (ref 0–44)
AST: 28 U/L (ref 15–41)
Albumin: 2.9 g/dL — ABNORMAL LOW (ref 3.5–5.0)
Alkaline Phosphatase: 141 U/L — ABNORMAL HIGH (ref 38–126)
Anion gap: 12 (ref 5–15)
BUN: 28 mg/dL — ABNORMAL HIGH (ref 8–23)
CO2: 20 mmol/L — ABNORMAL LOW (ref 22–32)
Calcium: 9.4 mg/dL (ref 8.9–10.3)
Chloride: 106 mmol/L (ref 98–111)
Creatinine: 1.32 mg/dL — ABNORMAL HIGH (ref 0.61–1.24)
GFR, Estimated: 56 mL/min — ABNORMAL LOW (ref >60)
Glucose, Bld: 106 mg/dL — ABNORMAL HIGH (ref 70–99)
Potassium: 4.2 mmol/L (ref 3.5–5.1)
Sodium: 138 mmol/L (ref 135–145)
Total Bilirubin: 1.1 mg/dL (ref 0.3–1.2)
Total Protein: 7.6 g/dL (ref 6.5–8.1)

## 2021-02-05 LAB — CBC WITH DIFFERENTIAL (CANCER CENTER ONLY)
Abs Immature Granulocytes: 0.05 10*3/uL (ref 0.00–0.07)
Basophils Absolute: 0 10*3/uL (ref 0.0–0.1)
Basophils Relative: 0 %
Eosinophils Absolute: 0.1 10*3/uL (ref 0.0–0.5)
Eosinophils Relative: 2 %
HCT: 25 % — ABNORMAL LOW (ref 39.0–52.0)
Hemoglobin: 8.1 g/dL — ABNORMAL LOW (ref 13.0–17.0)
Immature Granulocytes: 1 %
Lymphocytes Relative: 7 %
Lymphs Abs: 0.7 10*3/uL (ref 0.7–4.0)
MCH: 25.9 pg — ABNORMAL LOW (ref 26.0–34.0)
MCHC: 32.4 g/dL (ref 30.0–36.0)
MCV: 79.9 fL — ABNORMAL LOW (ref 80.0–100.0)
Monocytes Absolute: 0.9 10*3/uL (ref 0.1–1.0)
Monocytes Relative: 9 %
Neutro Abs: 7.7 10*3/uL (ref 1.7–7.7)
Neutrophils Relative %: 81 %
Platelet Count: 303 10*3/uL (ref 150–400)
RBC: 3.13 MIL/uL — ABNORMAL LOW (ref 4.22–5.81)
RDW: 17.1 % — ABNORMAL HIGH (ref 11.5–15.5)
WBC Count: 9.5 10*3/uL (ref 4.0–10.5)
nRBC: 0 % (ref 0.0–0.2)

## 2021-02-05 LAB — TSH: TSH: 3.254 u[IU]/mL (ref 0.320–4.118)

## 2021-02-05 MED ORDER — SODIUM CHLORIDE 0.9 % IV SOLN: Freq: Once | INTRAVENOUS | Status: DC

## 2021-02-05 MED ORDER — SODIUM CHLORIDE 0.9 % IV SOLN: Freq: Once | INTRAVENOUS | Status: AC

## 2021-02-05 MED ORDER — SODIUM CHLORIDE 0.9% FLUSH
10.0000 mL | Freq: Once | INTRAVENOUS | Status: AC
Start: 2021-02-05 — End: 2021-02-05
  Administered 2021-02-05: 10 mL

## 2021-02-05 MED ORDER — ACETAMINOPHEN 325 MG PO TABS
650.0000 mg | ORAL_TABLET | Freq: Once | ORAL | Status: AC
  Administered 2021-02-05: 650 mg via ORAL
  Filled 2021-02-05: qty 2

## 2021-02-05 MED ORDER — ONDANSETRON HCL 4 MG/2ML IJ SOLN
8.0000 mg | Freq: Once | INTRAMUSCULAR | Status: AC
  Administered 2021-02-05: 8 mg via INTRAVENOUS
  Filled 2021-02-05: qty 4

## 2021-02-05 NOTE — Progress Notes (Signed)
Hematology and Oncology Follow Up Visit  Lawrence Hall [id number] 03/30/1946 75 y.o. 02/05/2021 10:24 AM [name], MDKoberlein, [name], MD   Principle Diagnosis: 75 year old man with left renal pelvis tumor diagnosed in 2021.  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   Prior Therapy: He is status post biopsy obtained on Aug 21, 2019 which showed high-grade urothelial carcinoma of the left renal pelvis.   He status post neoadjuvant chemotherapy utilizing gemcitabine and cisplatin given on day 1 and cisplatin given on day 8 start her on 10/06/2019.   He completed 3 cycles of therapy in August 2021.  He is status post left robotic assisted laparoscopic nephroureterectomy and retroperitoneal lymphadenectomy completed on 01/04/2020.  He was found to have a residual T4 disease with 1 out of 5 lymph nodes involved.  He is status post tumor resection from his right humerus with surgical fixation and rodding completed by Dr. Lovena Le at [facility] completed in June 2022.  The final pathology confirmed the presence of metastatic urothelial carcinoma.  Radiation therapy to be right humerus.  He completed 10 fractions for a total of 30 Gray completed in July 2022.  Pembrolizumab 200 mg started on Sep 13, 2020.  He is currently receiving 400 mg every 6 weeks.  He completed cycle 3 on December 13, 2020.  Current therapy: Padcev 1 mg/kg every other week with cycle 1 starting on 02/12/2021.  He will receive treatment day 1 and day 15 out of a 28-day cycle.      Interim History: [name] presents today for repeat evaluation.  Since last visit, he reports few complaints that are still lingering although slightly improved.  He is still quite debilitated, fatigue with poor p.o. intake.  He denies any vomiting but does report some nausea.  He denies any shortness of breath or difficulty breathing.  He denies any worsening diarrhea.  He denies  any headaches, blurry vision or syncope.     Medications: Updated on review. Current Outpatient Medications  Medication Sig Dispense Refill   acetaminophen (TYLENOL) 500 MG tablet Take 500 mg by mouth as needed for moderate pain.     aspirin EC 81 MG tablet Take 1 tablet (81 mg total) by mouth daily. Swallow whole. 30 tablet 11   atorvastatin (LIPITOR) 80 MG tablet TAKE 1 TABLET ONCE DAILY AT 6 PM. (Patient taking differently: Take 80 mg by mouth daily.) 90 tablet 3   cholecalciferol (VITAMIN D3) 25 MCG (1000 UNIT) tablet Take 1,000 Units by mouth daily.     HYDROmorphone (DILAUDID) 2 MG tablet Take 0.5 tablets (1 mg total) by mouth every 6 (six) hours as needed for severe pain. 10 tablet 0   metoprolol succinate (TOPROL-XL) 50 MG 24 hr tablet Take 1 tablet (50 mg total) by mouth at bedtime. 90 tablet 3   Milk Thistle 1000 MG CAPS Take 1 capsule by mouth daily.     mirtazapine (REMERON) 15 MG tablet Take 1 tablet (15 mg total) by mouth at bedtime. 90 tablet 1   nitroGLYCERIN (NITROSTAT) 0.4 MG SL tablet 1 TAB UNDER TONGUE AS NEEDED FOR CHEST PAIN. MAY REPEAT EVERY 5 MIN FOR A TOTAL OF 3 DOSES. (Patient taking differently: Place 0.4 mg under the tongue every 5 (five) minutes as needed for chest pain.) 25 tablet 4   oxyCODONE (OXY IR/ROXICODONE) 5 MG immediate release tablet Take 1 tablet (5 mg total) by mouth every 4 (four) hours as needed  for breakthrough pain or moderate pain. 20 tablet 0   polyethylene glycol (MIRALAX / GLYCOLAX) 17 g packet Take 17 g by mouth daily. 14 each 0   prochlorperazine (COMPAZINE) 10 MG tablet TAKE 1 TABLET EVERY 6 HOURS AS NEEDED FOR NAUSEA & VOMITING. 30 tablet 0   No current facility-administered medications for this visit.     Allergies:  Allergies  Allergen Reactions   Other Itching and Other (See Comments)    Pet dander, seasonal allergies = Runny nose, itchy eyes      Physical Exam:     Blood pressure 101/63, pulse 87, temperature 98 F (36.7  C), temperature source Axillary, resp. rate 18, SpO2 100 %.   ECOG: 2     General appearance: Alert, awake appeared chronically ill. Head: Atraumatic without abnormalities Oropharynx: Without any thrush or ulcers. Eyes: No scleral icterus. Lymph nodes: No lymphadenopathy noted in the cervical, supraclavicular, or axillary nodes Heart:regular rate and rhythm, without any murmurs or gallops.   Lung: Clear to auscultation without any rhonchi, wheezes or dullness to percussion. Abdomin: Soft, nontender without any shifting dullness or ascites. Musculoskeletal: No clubbing or cyanosis. Neurological: No motor or sensory deficits. Skin: No rashes or lesions.            Lab Results: Lab Results  Component Value Date   WBC 9.0 01/23/2021   HGB 8.2 (L) 01/23/2021   HCT 25.9 (L) 01/23/2021   MCV 83.3 01/23/2021   PLT 222 01/23/2021     Chemistry      Component Value Date/Time   NA 134 (L) 01/25/2021 0545   NA 137 04/07/2019 1021   K 3.9 01/25/2021 0545   CL 104 01/25/2021 0545   CO2 23 01/25/2021 0545   BUN 17 01/25/2021 0545   BUN 29 (H) 04/07/2019 1021   CREATININE 1.28 (H) 01/25/2021 0545   CREATININE 1.43 (H) 12/13/2020 1214      Component Value Date/Time   CALCIUM 8.0 (L) 01/25/2021 0545   ALKPHOS 91 01/23/2021 0915   AST 29 01/23/2021 0915   AST 26 12/13/2020 1214   ALT 25 01/23/2021 0915   ALT 22 12/13/2020 1214   BILITOT 1.2 01/23/2021 0915   BILITOT 1.0 12/13/2020 1214         Impression and Plan:  75 year old man with:     1.  Left renal pelvis tumor diagnosed in 2021.  He developed stage IV high-grade urothelial carcinoma with hepatic and bone involvement.   The natural course of this disease was reviewed at this time and treatment options were reiterated.  He has disease progression noted on imaging studies completed on January 23, 2021.  Risks and benefits of proceeding with Padcev were discussed.  Complication include nausea, vomiting,  myelosuppression, neuropathy and hyperglycemia were reiterated.  After discussion today, he opted to proceed with this treatment a week from today and will receive IV hydration today.     2.  IV access: Port-A-Cath will continue to be in use at this time.    3.  Weight loss, dehydration and failure to thrive: Likely related to his disease progression.  We will arrange for IV infusion periodically for supportive measures.      4.  Anemia: Related to malignancy and will continue to follow and transfuse as needed.  Risks and benefits of transfusion were discussed at this time and will await results of his laboratory testing to arrange for the.   5.  Prognosis and goals of care: His disease  is incurable and any treatment is palliative.  He understands he has disease that is progressing and that without systemic treatment soon will continue to decline and likely require hospice.  His performance status remains reasonable and he is willing to try treatment.  We will continue to address this if he declines further consideration for hospice will be revisited.    6.  Follow-up: He will return on October 26 for day 1 of chemotherapy.     30  minutes were spent on this visit.  Time was dedicated to reviewing laboratory data, disease status update, discussing treatment choices and outlining future plan of care.       [name], MD 10/19/202210:24 AM

## 2021-02-05 NOTE — Patient Instructions (Signed)

## 2021-02-05 NOTE — Progress Notes (Signed)
Pharmacist Chemotherapy Monitoring - Initial Assessment    Anticipated start date: 02/12/21   The following has been reviewed per standard work regarding the patient's treatment regimen: The patient's diagnosis, treatment plan and drug doses, and organ/hematologic function Lab orders and baseline tests specific to treatment regimen  The treatment plan start date, drug sequencing, and pre-medications Prior authorization status  Patient's documented medication list, including drug-drug interaction screen and prescriptions for anti-emetics and supportive care specific to the treatment regimen The drug concentrations, fluid compatibility, administration routes, and timing of the medications to be used The patient's access for treatment and lifetime cumulative dose history, if applicable  The patient's medication allergies and previous infusion related reactions, if applicable   Changes made to treatment plan:  N/A  Follow up needed:  N/A   Kennith Center, Pharm.D., CPP 02/05/2021@2 :29 PM

## 2021-02-05 NOTE — Progress Notes (Signed)
Patient states he is very weak, is not sure he can tolerate tx today.  Pt brought directly to exam room, did not have port accessed or labs drawn, wants to discuss situation with Dr. Alen Blew before proceeding.  MD in exam room to speak with patient.  Patient will not be receiving chemo today, only fluids.

## 2021-02-11 ENCOUNTER — Other Ambulatory Visit: Payer: Self-pay | Admitting: *Deleted

## 2021-02-11 ENCOUNTER — Encounter (HOSPITAL_COMMUNITY): Payer: Self-pay

## 2021-02-11 ENCOUNTER — Other Ambulatory Visit: Payer: PPO

## 2021-02-11 ENCOUNTER — Other Ambulatory Visit: Payer: Self-pay

## 2021-02-11 ENCOUNTER — Emergency Department (HOSPITAL_COMMUNITY)
Admission: EM | Admit: 2021-02-11 | Discharge: 2021-02-11 | Disposition: A | Discharge status: Home / Self Care | Payer: PPO | Attending: Emergency Medicine | Admitting: Emergency Medicine

## 2021-02-11 ENCOUNTER — Inpatient Hospital Stay (HOSPITAL_BASED_OUTPATIENT_CLINIC_OR_DEPARTMENT_OTHER): Payer: PPO | Admitting: Physician Assistant

## 2021-02-11 ENCOUNTER — Other Ambulatory Visit: Payer: Self-pay | Admitting: Physician Assistant

## 2021-02-11 ENCOUNTER — Telehealth: Payer: Self-pay | Admitting: *Deleted

## 2021-02-11 ENCOUNTER — Encounter: Payer: PPO | Admitting: Physician Assistant

## 2021-02-11 ENCOUNTER — Inpatient Hospital Stay: Payer: PPO

## 2021-02-11 VITALS — BP 120/81 | HR 117 | Temp 99.2°F | Resp 18

## 2021-02-11 DIAGNOSIS — Z95828 Presence of other vascular implants and grafts: Secondary | ICD-10-CM | POA: Diagnosis not present

## 2021-02-11 DIAGNOSIS — R112 Nausea with vomiting, unspecified: Secondary | ICD-10-CM | POA: Insufficient documentation

## 2021-02-11 DIAGNOSIS — Z8505 Personal history of malignant neoplasm of liver: Secondary | ICD-10-CM | POA: Insufficient documentation

## 2021-02-11 DIAGNOSIS — Z5112 Encounter for antineoplastic immunotherapy: Secondary | ICD-10-CM | POA: Diagnosis not present

## 2021-02-11 DIAGNOSIS — R531 Weakness: Secondary | ICD-10-CM | POA: Diagnosis not present

## 2021-02-11 DIAGNOSIS — Z5321 Procedure and treatment not carried out due to patient leaving prior to being seen by health care provider: Secondary | ICD-10-CM | POA: Insufficient documentation

## 2021-02-11 LAB — CBC WITH DIFFERENTIAL/PLATELET
Abs Immature Granulocytes: 0.06 10*3/uL (ref 0.00–0.07)
Basophils Absolute: 0.1 10*3/uL (ref 0.0–0.1)
Basophils Relative: 1 %
Eosinophils Absolute: 0.1 10*3/uL (ref 0.0–0.5)
Eosinophils Relative: 1 %
HCT: 28.7 % — ABNORMAL LOW (ref 39.0–52.0)
Hemoglobin: 9 g/dL — ABNORMAL LOW (ref 13.0–17.0)
Immature Granulocytes: 1 %
Lymphocytes Relative: 10 %
Lymphs Abs: 0.9 10*3/uL (ref 0.7–4.0)
MCH: 26.3 pg (ref 26.0–34.0)
MCHC: 31.4 g/dL (ref 30.0–36.0)
MCV: 83.9 fL (ref 80.0–100.0)
Monocytes Absolute: 0.9 10*3/uL (ref 0.1–1.0)
Monocytes Relative: 10 %
Neutro Abs: 7.4 10*3/uL (ref 1.7–7.7)
Neutrophils Relative %: 77 %
Platelets: 296 10*3/uL (ref 150–400)
RBC: 3.42 MIL/uL — ABNORMAL LOW (ref 4.22–5.81)
RDW: 16.7 % — ABNORMAL HIGH (ref 11.5–15.5)
WBC: 9.5 10*3/uL (ref 4.0–10.5)
nRBC: 0 % (ref 0.0–0.2)

## 2021-02-11 LAB — COMPREHENSIVE METABOLIC PANEL
ALT: 18 U/L (ref 0–44)
AST: 30 U/L (ref 15–41)
Albumin: 3.4 g/dL — ABNORMAL LOW (ref 3.5–5.0)
Alkaline Phosphatase: 135 U/L — ABNORMAL HIGH (ref 38–126)
Anion gap: 13 (ref 5–15)
BUN: 37 mg/dL — ABNORMAL HIGH (ref 8–23)
CO2: 20 mmol/L — ABNORMAL LOW (ref 22–32)
Calcium: 9.4 mg/dL (ref 8.9–10.3)
Chloride: 105 mmol/L (ref 98–111)
Creatinine, Ser: 1.49 mg/dL — ABNORMAL HIGH (ref 0.61–1.24)
GFR, Estimated: 48 mL/min — ABNORMAL LOW (ref >60)
Glucose, Bld: 145 mg/dL — ABNORMAL HIGH (ref 70–99)
Potassium: 3.8 mmol/L (ref 3.5–5.1)
Sodium: 138 mmol/L (ref 135–145)
Total Bilirubin: 1.6 mg/dL — ABNORMAL HIGH (ref 0.3–1.2)
Total Protein: 8.4 g/dL — ABNORMAL HIGH (ref 6.5–8.1)

## 2021-02-11 LAB — MAGNESIUM: Magnesium: 2.4 mg/dL (ref 1.7–2.4)

## 2021-02-11 MED ORDER — SODIUM CHLORIDE 0.9% FLUSH
10.0000 mL | Freq: Once | INTRAVENOUS | Status: AC
  Administered 2021-02-11: 10 mL

## 2021-02-11 MED ORDER — SODIUM CHLORIDE 0.9 % IV SOLN
Freq: Once | INTRAVENOUS | Status: AC
Start: 2021-02-11 — End: 2021-02-11

## 2021-02-11 MED ORDER — SODIUM CHLORIDE 0.9 % IV SOLN: INTRAVENOUS | Status: DC

## 2021-02-11 MED ORDER — ONDANSETRON HCL 4 MG/2ML IJ SOLN
4.0000 mg | Freq: Once | INTRAMUSCULAR | Status: DC
  Administered 2021-02-11: 4 mg via INTRAVENOUS

## 2021-02-11 MED ORDER — HEPARIN SOD (PORK) LOCK FLUSH 100 UNIT/ML IV SOLN
500.0000 [IU] | Freq: Once | INTRAVENOUS | Status: AC
  Administered 2021-02-11: 500 [IU]

## 2021-02-11 MED ORDER — SODIUM CHLORIDE 0.9% FLUSH: 10.0000 mL | Freq: Once | INTRAVENOUS | Status: DC

## 2021-02-11 MED FILL — Dexamethasone Sodium Phosphate Inj 100 MG/10ML: INTRAMUSCULAR | Qty: 1 | Status: AC

## 2021-02-11 NOTE — ED Provider Notes (Signed)
Emergency Medicine Provider Triage Evaluation Note  Lawrence Hall , a 75 y.o. male  was evaluated in triage.  Pt complains of dehydration and intractable vomiting.  He has been having ongoing chronic generalized weakness secondary to his stage IV renal cancer.  He is scheduled to have an infusion in the next couple days but feels very washed out.  Denies any fever, chills, cough, congestion, abdominal pain, diarrhea, or urinary complaints.  Review of Systems  Positive:  Negative: See above  Physical Exam  BP 121/75 (BP Location: Left Arm)   Pulse (!) 115   Temp 97.7 F (36.5 C) (Oral)   Resp 18   Ht 6' (1.829 m)   Wt 73.1 kg   SpO2 100%   BMI 21.86 kg/m  Gen:   Awake, no distress   Resp:  Normal effort  MSK:   Moves extremities without difficulty  Other:    Medical Decision Making  Medically screening exam initiated at 9:53 AM.  Appropriate orders placed.  [name] was informed that the remainder of the evaluation will be completed by another provider, this initial triage assessment does not replace that evaluation, and the importance of remaining in the ED until their evaluation is complete.     Myna Bright [place], PA-C 02/11/21 0786    Sherwood Gambler, MD 02/11/21 1159

## 2021-02-11 NOTE — Patient Instructions (Signed)

## 2021-02-11 NOTE — Progress Notes (Signed)
Symptom Management Consult note [place]   [phone] [phone]    Patient Care Team: [name], MD as PCP - General (Family Medicine) [name], Dani Gobble, MD as PCP - Cardiology (Cardiology) Raynelle Bring, MD as Consulting Physician (Urology) Viona Gilmore, [facility] as Pharmacist (Pharmacist)    Name of the patient: Lawrence Hall  [id number]  01-21-1946   Date of visit: 02/11/2021    Chief complaint/ Reason for visit- nausea and generalized weakness  Oncology History  Malignant tumor of renal pelvis ([place]) (Resolved)  09/12/2019 Initial Diagnosis   Malignant tumor of renal pelvis ([place])   10/06/2019 - 11/24/2019 Chemotherapy          Malignant tumor of renal pelvis, left ([place])  01/04/2020 Initial Diagnosis   Malignant tumor of renal pelvis, left ([place])   09/06/2020 Cancer Staging   Staging form: Renal Pelvis and Ureter, AJCC 8th Edition - Clinical: Stage IV (cT4, cN1, cM1) - Signed by Wyatt Portela, MD on 09/06/2020    09/13/2020 - 12/13/2020 Chemotherapy   Patient is on Treatment Plan : HEAD/NECK Pembrolizumab Q21D     02/12/2021 -  Chemotherapy   Patient is on Treatment Plan : UROTHELIAL LOCALLY ADVANCED, METASTATIC Enfortumab D1,8,15 q28d       Current Therapy: to start Enfortumab tomorrow  Interval history- [place]. Ruddy Swire is a 75 yo male with above mentioned history presenting to [facility] today with chief complaint of nausea and generalized weakness that has been progressively worsening x3 days.  Patient states he is absolutely no energy.  He has been unable to tolerate any solid foods, he states he is trying to stay well-hydrated drinking at least 24 ounces of fluids daily.  Patient states his nausea has been intermittent and he has been taking Compazine at home for this, last dose was at 9 AM this morning and he did not feel it was helpful.  Denies any emesis today.  Patient is scheduled to start new treatment tomorrow and is  worried he is too weak for this to happen.  No sick contacts or known COVID exposures.  He admits to history of constipation although had a normal bowel movement yesterday.  No urinary symptoms.   REVIEW OF SYSTEMS- Review of Systems  Constitutional:  Negative for chills, fever and malaise/fatigue.  HENT:  Negative for congestion, ear pain and sinus pain.   Eyes:  Negative for pain.  Respiratory:  Negative for cough, shortness of breath and wheezing.   Cardiovascular:  Negative for chest pain and leg swelling.  Gastrointestinal:  Positive for constipation and nausea. Negative for abdominal pain, blood in stool, diarrhea and vomiting.  Genitourinary:  Negative for dysuria, frequency, hematuria and urgency.  Musculoskeletal:  Negative for back pain and falls.  Neurological:  Positive for weakness (generalized). Negative for dizziness and focal weakness.  Psychiatric/Behavioral:  The patient is nervous/anxious.       Allergies  Allergen Reactions   Other Itching and Other (See Comments)    Pet dander, seasonal allergies = Runny nose, itchy eyes     Past Medical History:  Diagnosis Date   Arthritis    mild hands   Asthma    pt denies 4/21    Chest pain 07/25/2012   Coronary artery disease    Dysrhythmia    PAC'S, hx of atrial fib after OHS 04/2018   ED (erectile dysfunction)    H/O hiatal hernia    Heart murmur    Hypercholesterolemia  Hypertension    MARGINALLY HIGH - NO MEDS -MONITORS   Metastatic cancer to bone ([place]) dx'd 07/2020   rt humerus   Myocardial infarction ([place])    2017    Pneumonia    hx of 2018    PONV (postoperative nausea and vomiting)    Prostate cancer ([place]) 03/27/2011   prostate bx=adenocarcinoma,   Skin cancer 2008   basal cell face /removed   Sleep apnea    borderline sleep apnea per patient no devices    Urothelial cancer ([place]) dx'd 05/2019   left renal ca     Past Surgical History:  Procedure Laterality Date   AORTIC VALVE REPLACEMENT N/A  04/29/2018   Procedure: AORTIC VALVE REPLACEMENT (AVR) using INSPIRIS RESILIA  AORTIC VALVE 90mm;  Surgeon: Ivin Poot, MD;  Location: [place];  Service: Open Heart Surgery;  Laterality: N/A;   basal cell cancer  2008   inside nose   CARDIAC CATHETERIZATION N/A 06/03/2015   Procedure: Left Heart Cath and Coronary Angiography;  Surgeon: [name] Sine, MD;  Location: [place] [facility];  Service: Cardiovascular;  Laterality: N/A;   CATARACT EXTRACTION, BILATERAL  2007   CORONARY ARTERY BYPASS GRAFT N/A 04/29/2018   Procedure: CORONARY ARTERY BYPASS GRAFTING (CABG) X 3; Using Left Internal Mammary Artery (LIMA) and Right Leg Greater Saphenous Vein Graft (SVG);  LIMA to LAD, SVG to OM1, SVG to RCA,;  Surgeon: Ivin Poot, MD;  Location: [place];  Service: Open Heart Surgery;  Laterality: N/A;   CYSTOSCOPY/RETROGRADE/URETEROSCOPY Left 08/21/2019   Procedure: CYSTOSCOPY/RETROGRADE/URETEROSCOPY/  LEFT BIOPSY/BRUSH BIOPSY/RENAL WASHINGS/ AND STENT PLACEMENT;  Surgeon: Raynelle Bring, MD;  Location: [facility];  Service: Urology;  Laterality: Left;   IR IMAGING GUIDED PORT INSERTION  09/28/2019   RIGHT/LEFT HEART CATH AND CORONARY ANGIOGRAPHY N/A 04/26/2018   Procedure: RIGHT/LEFT HEART CATH AND CORONARY ANGIOGRAPHY;  Surgeon: [name] Sine, MD;  Location: [place] [facility];  Service: Cardiovascular;  Laterality: N/A;   ROBOT ASSISTED LAPAROSCOPIC RADICAL PROSTATECTOMY  08/10/2011   Procedure: ROBOTIC ASSISTED LAPAROSCOPIC RADICAL PROSTATECTOMY LEVEL 2;  Surgeon: Dutch Gray, MD;  Location: [facility];  Service: Urology;  Laterality: N/A;   ROBOT ASSITED LAPAROSCOPIC NEPHROURETERECTOMY Left 01/04/2020   Procedure: XI ROBOT ASSITED LAPAROSCOPIC NEPHROURETERECTOMY;  Surgeon: Raynelle Bring, MD;  Location: [facility];  Service: Urology;  Laterality: Left;  NEEDS 240 MIN FOR PROCEDURE   TEE WITHOUT CARDIOVERSION N/A 04/29/2018   Procedure: TRANSESOPHAGEAL ECHOCARDIOGRAM (TEE);  Surgeon: Prescott Gum, Collier Salina, MD;  Location: [place];  Service: Open Heart Surgery;  Laterality: N/A;   THYROID SURGERY  infant   uncertain details; no thyroid replacement listed   WISDOM TOOTH EXTRACTION      Social History   Socioeconomic History   Marital status: Married    Spouse name: [name]    Number of children: 0   Years of education: 15 years    Highest education level: Not on file  Occupational History    Employer: [employer]    Comment: customer service [employer]  Tobacco Use   Smoking status: Never   Smokeless tobacco: Never  Vaping Use   Vaping Use: Never used  Substance and Sexual Activity   Alcohol use: Not Currently   Drug use: No   Sexual activity: Not on file  Other Topics Concern   Not on file  Social History Narrative   Not on file   Social Determinants of Health   Financial Resource Strain: Low Risk  Difficulty of Paying Living Expenses: Not hard at all  Food Insecurity: No Food Insecurity   Worried About [place] in the Last Year: Never true   Ran Out of Food in the Last Year: Never true  Transportation Needs: No Transportation Needs   Lack of Transportation (Medical): No   Lack of Transportation (Non-Medical): No  Physical Activity: Inactive   Days of Exercise per Week: 0 days   Minutes of Exercise per Session: 0 min  Stress: No Stress Concern Present   Feeling of Stress : Not at all  Social Connections: Moderately Integrated   Frequency of Communication with Friends and Family: Once a week   Frequency of Social Gatherings with Friends and Family: Twice a week   Attends Religious Services: 1 to 4 times per year   Active Member of [employer] or Organizations: No   Attends Music therapist: Never   Marital Status: Married  Human resources officer Violence: Not At Risk   Fear of Current or Ex-Partner: No   Emotionally Abused: No   Physically Abused: No   Sexually Abused: No    Family History  Problem Relation Age of Onset   Prostate cancer Father 18       in his  70's/other comorbidities/79 deceased   Alcohol abuse Father    Liver disease Father    Heart attack Father 82   Breast cancer Mother 5       mets to bone,deceased age 79   Cervical cancer Sister    Cervical cancer Sister    Breast cancer Sister    CAD Brother        Died of sudden cardiac death/MI     Current Outpatient Medications:    acetaminophen (TYLENOL) 500 MG tablet, Take 500 mg by mouth as needed for moderate pain., Disp: , Rfl:    aspirin EC 81 MG tablet, Take 1 tablet (81 mg total) by mouth daily. Swallow whole., Disp: 30 tablet, Rfl: 11   atorvastatin (LIPITOR) 80 MG tablet, TAKE 1 TABLET ONCE DAILY AT 6 PM. (Patient taking differently: Take 80 mg by mouth daily.), Disp: 90 tablet, Rfl: 3   cholecalciferol (VITAMIN D3) 25 MCG (1000 UNIT) tablet, Take 1,000 Units by mouth daily., Disp: , Rfl:    HYDROmorphone (DILAUDID) 2 MG tablet, Take 0.5 tablets (1 mg total) by mouth every 6 (six) hours as needed for severe pain., Disp: 10 tablet, Rfl: 0   metoprolol succinate (TOPROL-XL) 50 MG 24 hr tablet, Take 1 tablet (50 mg total) by mouth at bedtime., Disp: 90 tablet, Rfl: 3   Milk Thistle 1000 MG CAPS, Take 1 capsule by mouth daily., Disp: , Rfl:    mirtazapine (REMERON) 15 MG tablet, Take 1 tablet (15 mg total) by mouth at bedtime., Disp: 90 tablet, Rfl: 1   nitroGLYCERIN (NITROSTAT) 0.4 MG SL tablet, 1 TAB UNDER TONGUE AS NEEDED FOR CHEST PAIN. MAY REPEAT EVERY 5 MIN FOR A TOTAL OF 3 DOSES. (Patient taking differently: Place 0.4 mg under the tongue every 5 (five) minutes as needed for chest pain.), Disp: 25 tablet, Rfl: 4   oxyCODONE (OXY IR/ROXICODONE) 5 MG immediate release tablet, Take 1 tablet (5 mg total) by mouth every 4 (four) hours as needed for breakthrough pain or moderate pain., Disp: 20 tablet, Rfl: 0   polyethylene glycol (MIRALAX / GLYCOLAX) 17 g packet, Take 17 g by mouth daily., Disp: 14 each, Rfl: 0   prochlorperazine (COMPAZINE) 10 MG tablet, TAKE 1 TABLET  EVERY 6  HOURS AS NEEDED FOR NAUSEA & VOMITING., Disp: 30 tablet, Rfl: 0  PHYSICAL EXAM: ECOG FS:1 - Symptomatic but completely ambulatory    Vitals:   02/11/21 1337  BP: 120/81  Pulse: (!) 117  Resp: 18  Temp: 99.2 F (37.3 C)  TempSrc: Oral  SpO2: 100%   Physical Exam Vitals and nursing note reviewed.  Constitutional:      General: He is not in acute distress.    Appearance: He is not ill-appearing.  HENT:     Head: Normocephalic and atraumatic.     Right Ear: Tympanic membrane and external ear normal.     Left Ear: Tympanic membrane and external ear normal.     Nose: Nose normal.     Mouth/Throat:     Mouth: Mucous membranes are moist.     Pharynx: Oropharynx is clear.  Eyes:     General: No scleral icterus.       Right eye: No discharge.        Left eye: No discharge.     Extraocular Movements: Extraocular movements intact.     Conjunctiva/sclera: Conjunctivae normal.     Pupils: Pupils are equal, round, and reactive to light.  Neck:     Vascular: No JVD.  Cardiovascular:     Rate and Rhythm: Regular rhythm. Tachycardia present.     Pulses: Normal pulses.          Radial pulses are 2+ on the right side and 2+ on the left side.     Heart sounds: Normal heart sounds.  Pulmonary:     Comments: Lungs clear to auscultation in all fields. Symmetric chest rise. No wheezing, rales, or rhonchi. Abdominal:     Comments: Abdomen is soft, non-distended, and non-tender in all quadrants. No rigidity, no guarding. No peritoneal signs.  Musculoskeletal:        General: Normal range of motion.     Cervical back: Normal range of motion.  Skin:    General: Skin is warm and dry.     Capillary Refill: Capillary refill takes less than 2 seconds.  Neurological:     Mental Status: He is oriented to person, place, and time.     GCS: GCS eye subscore is 4. GCS verbal subscore is 5. GCS motor subscore is 6.     Comments: Fluent speech, no facial droop.  Psychiatric:        Mood and Affect:  Mood is anxious. Affect is tearful.        Behavior: Behavior normal.       LABORATORY DATA: I have reviewed the data as listed CBC Latest Ref Rng & Units 02/11/2021 02/05/2021 01/23/2021  WBC 4.0 - 10.5 K/uL 9.5 9.5 9.0  Hemoglobin 13.0 - 17.0 g/dL 9.0(L) 8.1(L) 8.2(L)  Hematocrit 39.0 - 52.0 % 28.7(L) 25.0(L) 25.9(L)  Platelets 150 - 400 K/uL 296 303 222     CMP Latest Ref Rng & Units 02/11/2021 02/05/2021 01/25/2021  Glucose 70 - 99 mg/dL 145(H) 106(H) 109(H)  BUN 8 - 23 mg/dL 37(H) 28(H) 17  Creatinine 0.61 - 1.24 mg/dL 1.49(H) 1.32(H) 1.28(H)  Sodium 135 - 145 mmol/L 138 138 134(L)  Potassium 3.5 - 5.1 mmol/L 3.8 4.2 3.9  Chloride 98 - 111 mmol/L 105 106 104  CO2 22 - 32 mmol/L 20(L) 20(L) 23  Calcium 8.9 - 10.3 mg/dL 9.4 9.4 8.0(L)  Total Protein 6.5 - 8.1 g/dL 8.4(H) 7.6 -  Total Bilirubin 0.3 - 1.2 mg/dL 1.6(H)  1.1 -  Alkaline Phos 38 - 126 U/L 135(H) 141(H) -  AST 15 - 41 U/L 30 28 -  ALT 0 - 44 U/L 18 22 -       RADIOGRAPHIC STUDIES: I have personally reviewed the radiological images as listed and agreed with the findings in the report. No images are attached to the encounter. CT Chest W Contrast  Result Date: 01/23/2021 CLINICAL DATA:  Abdominal pain with vomiting, back pain, and diarrhea. History of metastatic left renal malignancy and history prostate cancer. EXAM: CT CHEST, ABDOMEN, AND PELVIS WITH CONTRAST TECHNIQUE: Multidetector CT imaging of the chest, abdomen and pelvis was performed following the standard protocol during bolus administration of intravenous contrast. CONTRAST:  30mL OMNIPAQUE IOHEXOL 350 MG/ML SOLN COMPARISON:  Multiple exams, including 09/03/2020 FINDINGS: CT CHEST FINDINGS Cardiovascular: Right Port-A-Cath tip: Cavoatrial junction. Coronary, aortic arch, and branch vessel atherosclerotic vascular disease. Prior CABG. Mild cardiomegaly. Ascending thoracic aortic currently measured 3.8 cm. Mediastinum/Nodes: Unremarkable Lungs/Pleura: Mild  scarring inferiorly in the right middle lobe appears unchanged. There is mild dependent atelectasis in both lower lobes. Mild left upper lobe scarring anteriorly appears unchanged. No compelling findings of metastatic disease to the lungs. Musculoskeletal: Postoperative findings in the right humerus. Healing fracture of the left anterior seventh rib on image 127 series 4, new compared to the prior exam of 09/03/2020. Lower thoracic spondylosis. CT ABDOMEN PELVIS FINDINGS Hepatobiliary: Scattered ill-defined hepatic metastatic lesions are present in the liver, and appear increased in size and number compared to previous. One of the larger lesions inferiorly in the right hepatic lobe measures 9.5 by 7.4 cm, previously measured at 4.9 by 4.7 cm. Another dominant lesion in the right hepatic lobe measures 7.3 by 4.5 cm, previously measured at 3.7 by 2.7 cm. Other scattered smaller hypodense lesions in the liver are present, some of these are new compared to 09/03/2020 although admittedly the use of contrast on today's exam might increase conspicuity substantially. Adjacent to the inferior right hepatic lobe lesion there is small amount of perihepatic ascites and stranding in the adjacent adipose tissues. Mild gallbladder wall thickening with multiple gallstones in the gallbladder. No biliary dilatation. Subtle periportal edema. Pancreas: Unremarkable Spleen: Small amount of fluid density along the medial spleen increased from prior, image 58 series 2, potentially simply a small amount of local ascites. Accessory splenic tissue noted. Adrenals/Urinary Tract: Adrenal glands unremarkable. Left nephrectomy without findings of local recurrence. Small cyst of the right kidney lower pole, technically too small to characterize at 0.8 cm in diameter, although statistically likely to be benign. No hydronephrosis or hydroureter. Urinary bladder unremarkable. Stomach/Bowel: The hepatic flexure but some of the stranding adjacent to  the liver, I do not see definite findings of abnormality in the colon, with areas of mild wall thickening in this vicinity probably related to nondistention. Vascular/Lymphatic: Atherosclerosis is present, including aortoiliac atherosclerotic disease. Infrarenal left-sided IVC, a venous variant. Chronic mural thrombus along the right proximal common iliac artery. Reproductive: Prostatectomy. Other: In addition to the perihepatic ascites there is a small amount of ascites in the right lower quadrant although the adjacent appendix appears normal. Musculoskeletal: Lucent lesion inferiorly in the L4 vertebral body is probably related to two adjacent Schmorl's nodes, and is unchanged from prior. IMPRESSION: 1. Enlarging hepatic metastatic lesions. Adjacent to the dominant inferior right hepatic lobe lesion there is capsular bulging, stranding in the adjacent perihepatic adipose tissues, and a small amount of perihepatic ascites. This raises the possibility of tumor extension or mild  bleeding around the capsule leading to local inflammation. If the patient has had radiation therapy to the inferior right hepatic lobe lesion than that might be an alternative cause for the stranding in the perihepatic adipose tissue. Small amount of ascites is present along the gallbladder fossa and along the right lower paracolic gutter. There is also trace ascites along the medial margin of the spleen. 2. Currently no findings of metastatic lesions aside from those throughout the liver. 3. Healing subacute fracture of the left anterior seventh rib. 4. Cholelithiasis (cannot exclude cholecystitis given the surrounding fluid, nuclear medicine hepatobiliary scan could help assess patency of the cystic duct if clinically warranted). 5. Other imaging findings of potential clinical significance: Aortic Atherosclerosis (ICD10-I70.0). Coronary atherosclerosis. Mild cardiomegaly. Left nephrectomy. Infrarenal left-sided IVC, a venous variant.  Electronically Signed   By: Van Clines M.D.   On: 01/23/2021 12:43   CT Abdomen Pelvis W Contrast  Result Date: 01/23/2021 CLINICAL DATA:  Abdominal pain with vomiting, back pain, and diarrhea. History of metastatic left renal malignancy and history prostate cancer. EXAM: CT CHEST, ABDOMEN, AND PELVIS WITH CONTRAST TECHNIQUE: Multidetector CT imaging of the chest, abdomen and pelvis was performed following the standard protocol during bolus administration of intravenous contrast. CONTRAST:  20mL OMNIPAQUE IOHEXOL 350 MG/ML SOLN COMPARISON:  Multiple exams, including 09/03/2020 FINDINGS: CT CHEST FINDINGS Cardiovascular: Right Port-A-Cath tip: Cavoatrial junction. Coronary, aortic arch, and branch vessel atherosclerotic vascular disease. Prior CABG. Mild cardiomegaly. Ascending thoracic aortic currently measured 3.8 cm. Mediastinum/Nodes: Unremarkable Lungs/Pleura: Mild scarring inferiorly in the right middle lobe appears unchanged. There is mild dependent atelectasis in both lower lobes. Mild left upper lobe scarring anteriorly appears unchanged. No compelling findings of metastatic disease to the lungs. Musculoskeletal: Postoperative findings in the right humerus. Healing fracture of the left anterior seventh rib on image 127 series 4, new compared to the prior exam of 09/03/2020. Lower thoracic spondylosis. CT ABDOMEN PELVIS FINDINGS Hepatobiliary: Scattered ill-defined hepatic metastatic lesions are present in the liver, and appear increased in size and number compared to previous. One of the larger lesions inferiorly in the right hepatic lobe measures 9.5 by 7.4 cm, previously measured at 4.9 by 4.7 cm. Another dominant lesion in the right hepatic lobe measures 7.3 by 4.5 cm, previously measured at 3.7 by 2.7 cm. Other scattered smaller hypodense lesions in the liver are present, some of these are new compared to 09/03/2020 although admittedly the use of contrast on today's exam might increase  conspicuity substantially. Adjacent to the inferior right hepatic lobe lesion there is small amount of perihepatic ascites and stranding in the adjacent adipose tissues. Mild gallbladder wall thickening with multiple gallstones in the gallbladder. No biliary dilatation. Subtle periportal edema. Pancreas: Unremarkable Spleen: Small amount of fluid density along the medial spleen increased from prior, image 58 series 2, potentially simply a small amount of local ascites. Accessory splenic tissue noted. Adrenals/Urinary Tract: Adrenal glands unremarkable. Left nephrectomy without findings of local recurrence. Small cyst of the right kidney lower pole, technically too small to characterize at 0.8 cm in diameter, although statistically likely to be benign. No hydronephrosis or hydroureter. Urinary bladder unremarkable. Stomach/Bowel: The hepatic flexure but some of the stranding adjacent to the liver, I do not see definite findings of abnormality in the colon, with areas of mild wall thickening in this vicinity probably related to nondistention. Vascular/Lymphatic: Atherosclerosis is present, including aortoiliac atherosclerotic disease. Infrarenal left-sided IVC, a venous variant. Chronic mural thrombus along the right proximal common iliac artery.  Reproductive: Prostatectomy. Other: In addition to the perihepatic ascites there is a small amount of ascites in the right lower quadrant although the adjacent appendix appears normal. Musculoskeletal: Lucent lesion inferiorly in the L4 vertebral body is probably related to two adjacent Schmorl's nodes, and is unchanged from prior. IMPRESSION: 1. Enlarging hepatic metastatic lesions. Adjacent to the dominant inferior right hepatic lobe lesion there is capsular bulging, stranding in the adjacent perihepatic adipose tissues, and a small amount of perihepatic ascites. This raises the possibility of tumor extension or mild bleeding around the capsule leading to local  inflammation. If the patient has had radiation therapy to the inferior right hepatic lobe lesion than that might be an alternative cause for the stranding in the perihepatic adipose tissue. Small amount of ascites is present along the gallbladder fossa and along the right lower paracolic gutter. There is also trace ascites along the medial margin of the spleen. 2. Currently no findings of metastatic lesions aside from those throughout the liver. 3. Healing subacute fracture of the left anterior seventh rib. 4. Cholelithiasis (cannot exclude cholecystitis given the surrounding fluid, nuclear medicine hepatobiliary scan could help assess patency of the cystic duct if clinically warranted). 5. Other imaging findings of potential clinical significance: Aortic Atherosclerosis (ICD10-I70.0). Coronary atherosclerosis. Mild cardiomegaly. Left nephrectomy. Infrarenal left-sided IVC, a venous variant. Electronically Signed   By: Van Clines M.D.   On: 01/23/2021 12:43     ASSESSMENT & PLAN: Patient is a 75 y.o. male with past medical history of stage IV high-grade urothelial carcinoma with bone and hepatic involvement diagnosed in 2021 followed by oncologist Dr. Alen Blew.  1) Nausea and generalized weakness- Patient seen by provider in triage in the ED however left before full evaluation due to long wait time.  Patient did have labs drawn in triage prior to leaving which results I viewed.  CBC without leukocytosis, anemia consistent with baseline, normal platelets, ANC within normal range.  CMP without severe electrolyte derangement, no transaminitis, slight bump in BUN/Cr suggestive of dehydration. Patient given IV hydration here with 1 L NS and IV zofran. Patient is tolerating PO intake.  2) Tachycardia- Patient tachycardic on arrival to 117. He is anxious about not feeling well and clinically appears dehydrated.  Tachycardia improved after fluids.  I checked patient's heart rate prior to leaving clinic and it  is in the 90s.  Patient denied any symptoms of chest pain or shortness of breath, monitor here shows sinus rhythm.  3) stage IV high-grade urothelial carcinoma with bone and hepatic involvement- continue treatment per primary oncologist. Patient with CT 01/23/21 showing evidence of disease progression in the liver. Patient to start Padcev therapy tomorrow.   This is a shared visit with Dr. Alen Blew.  Visit Diagnosis: 1. Port-A-Cath in place   2. Nausea and vomiting, unspecified vomiting type      No orders of the defined types were placed in this encounter.   All questions were answered. The patient knows to call the clinic with any problems, questions or concerns. No barriers to learning was detected.  I have spent a total of 25 minutes of face-to-face and non-face-to-face time, preparing to see the patient, obtaining and/or reviewing separately obtained history, performing a medically appropriate examination, counseling and educating the patient, ordering medications/tests/procedures, referring and communicating with other health care professionals, documenting clinical information in the electronic health record, independently interpreting results and communicating results to the patient, and care coordination.    Thank you for allowing me to participate  in the care of this patient.    Barrie Folk, PA-C 02/11/2021 3:57 PM  The patient seen and examined personally and plan was discussed with him in detail.  He continues to struggle with poor oral intake and failure to thrive and currently receiving IV hydration.  Treatment options were discussed at this time which continued supportive management with IV hydration and attempt to start salvage chemotherapy which is scheduled to start next week.  I also gave him the option to discontinue all therapy and consider hospice enrollment.  He is still willing to try systemic chemotherapy at least for the time being.  He understands also  if his condition continues to deteriorate he will require hospice sooner than later.  He also inquired about the hospitalization which may be needed if he is unable to maintain adequate hydration or nutrition.  Laboratory data reviewed and showed slight increase in his BUN and creatinine which will be improved with intravenous hydration.  Will continue to follow status closely.

## 2021-02-11 NOTE — ED Triage Notes (Signed)
Patient c/o weakness and nausea/vomiting x 48 hours. Patient reports that he is too weak to stand.  Patient is currently being treated for liver cancer. Patient denies any abdominal pain and /or fever.  Patient states his last chemo was 12/20/20.

## 2021-02-11 NOTE — Telephone Encounter (Signed)
RN called to [facility] lobby to speak with with Lawrence Hall, patient's wife.  She states patient has been vomiting "for days" and that he is very weak.  He is currently in [facility] but has been told that he will most likely have a 10-hour wait & he is very upset.  She is asking if anything can be done for him here.  I spoke with [facility] staff, was told he can be seen at 1:00.  Informed patient's wife, she spoke with him & he does want to be seen here.  Spoke with Dr. Alen Blew, verbal order for IV fluids received.

## 2021-02-12 ENCOUNTER — Inpatient Hospital Stay: Payer: PPO | Admitting: Oncology

## 2021-02-12 ENCOUNTER — Inpatient Hospital Stay: Payer: PPO

## 2021-02-12 VITALS — BP 107/74 | HR 100 | Temp 96.8°F | Resp 18 | Ht 72.0 in | Wt 157.7 lb

## 2021-02-12 DIAGNOSIS — C679 Malignant neoplasm of bladder, unspecified: Secondary | ICD-10-CM

## 2021-02-12 DIAGNOSIS — C652 Malignant neoplasm of left renal pelvis: Secondary | ICD-10-CM

## 2021-02-12 DIAGNOSIS — Z5112 Encounter for antineoplastic immunotherapy: Secondary | ICD-10-CM | POA: Diagnosis not present

## 2021-02-12 DIAGNOSIS — Z95828 Presence of other vascular implants and grafts: Secondary | ICD-10-CM

## 2021-02-12 DIAGNOSIS — D649 Anemia, unspecified: Secondary | ICD-10-CM

## 2021-02-12 LAB — CBC WITH DIFFERENTIAL (CANCER CENTER ONLY)
Abs Immature Granulocytes: 0.04 10*3/uL (ref 0.00–0.07)
Basophils Absolute: 0.1 10*3/uL (ref 0.0–0.1)
Basophils Relative: 1 %
Eosinophils Absolute: 0.1 10*3/uL (ref 0.0–0.5)
Eosinophils Relative: 2 %
HCT: 24.3 % — ABNORMAL LOW (ref 39.0–52.0)
Hemoglobin: 7.6 g/dL — ABNORMAL LOW (ref 13.0–17.0)
Immature Granulocytes: 1 %
Lymphocytes Relative: 10 %
Lymphs Abs: 0.7 10*3/uL (ref 0.7–4.0)
MCH: 25.2 pg — ABNORMAL LOW (ref 26.0–34.0)
MCHC: 31.3 g/dL (ref 30.0–36.0)
MCV: 80.7 fL (ref 80.0–100.0)
Monocytes Absolute: 0.7 10*3/uL (ref 0.1–1.0)
Monocytes Relative: 10 %
Neutro Abs: 5.7 10*3/uL (ref 1.7–7.7)
Neutrophils Relative %: 76 %
Platelet Count: 251 10*3/uL (ref 150–400)
RBC: 3.01 MIL/uL — ABNORMAL LOW (ref 4.22–5.81)
RDW: 16.8 % — ABNORMAL HIGH (ref 11.5–15.5)
WBC Count: 7.4 10*3/uL (ref 4.0–10.5)
nRBC: 0 % (ref 0.0–0.2)

## 2021-02-12 LAB — SAMPLE TO BLOOD BANK

## 2021-02-12 LAB — CMP (CANCER CENTER ONLY)
ALT: 15 U/L (ref 0–44)
AST: 27 U/L (ref 15–41)
Albumin: 2.8 g/dL — ABNORMAL LOW (ref 3.5–5.0)
Alkaline Phosphatase: 131 U/L — ABNORMAL HIGH (ref 38–126)
Anion gap: 11 (ref 5–15)
BUN: 32 mg/dL — ABNORMAL HIGH (ref 8–23)
CO2: 21 mmol/L — ABNORMAL LOW (ref 22–32)
Calcium: 9.2 mg/dL (ref 8.9–10.3)
Chloride: 108 mmol/L (ref 98–111)
Creatinine: 1.28 mg/dL — ABNORMAL HIGH (ref 0.61–1.24)
GFR, Estimated: 58 mL/min — ABNORMAL LOW (ref >60)
Glucose, Bld: 126 mg/dL — ABNORMAL HIGH (ref 70–99)
Potassium: 3.7 mmol/L (ref 3.5–5.1)
Sodium: 140 mmol/L (ref 135–145)
Total Bilirubin: 1.1 mg/dL (ref 0.3–1.2)
Total Protein: 7.3 g/dL (ref 6.5–8.1)

## 2021-02-12 LAB — TSH: TSH: 2.04 u[IU]/mL (ref 0.320–4.118)

## 2021-02-12 MED ORDER — SODIUM CHLORIDE 0.9 % IV SOLN: INTRAVENOUS | Status: DC

## 2021-02-12 MED ORDER — SODIUM CHLORIDE 0.9% FLUSH
10.0000 mL | INTRAVENOUS | Status: DC | PRN
  Administered 2021-02-12: 10 mL

## 2021-02-12 MED ORDER — SODIUM CHLORIDE 0.9 % IV SOLN
0.9300 mg/kg | Freq: Once | INTRAVENOUS | Status: AC
  Administered 2021-02-12: 70 mg via INTRAVENOUS
  Filled 2021-02-12: qty 3

## 2021-02-12 MED ORDER — PALONOSETRON HCL INJECTION 0.25 MG/5ML
0.2500 mg | Freq: Once | INTRAVENOUS | Status: AC
  Administered 2021-02-12: 0.25 mg via INTRAVENOUS
  Filled 2021-02-12: qty 5

## 2021-02-12 MED ORDER — HEPARIN SOD (PORK) LOCK FLUSH 100 UNIT/ML IV SOLN
500.0000 [IU] | Freq: Once | INTRAVENOUS | Status: AC | PRN
  Administered 2021-02-12: 500 [IU]

## 2021-02-12 MED ORDER — SODIUM CHLORIDE 0.9% FLUSH
10.0000 mL | Freq: Once | INTRAVENOUS | Status: AC
  Administered 2021-02-12: 10 mL

## 2021-02-12 MED ORDER — SODIUM CHLORIDE 0.9 % IV SOLN
10.0000 mg | Freq: Once | INTRAVENOUS | Status: AC
  Administered 2021-02-12: 10 mg via INTRAVENOUS
  Filled 2021-02-12: qty 1
  Filled 2021-02-12: qty 10

## 2021-02-12 NOTE — Patient Instructions (Signed)
[facility]  Discharge Instructions: Thank you for choosing [facility] to provide your oncology and hematology care.   If you have a lab appointment with the Cancer Center, please go directly to the Cancer Center and check in at the registration area.   Wear comfortable clothing and clothing appropriate for easy access to any Portacath or PICC line.   We strive to give you quality time with your provider. You may need to reschedule your appointment if you arrive late (15 or more minutes).  Arriving late affects you and other patients whose appointments are after yours.  Also, if you miss three or more appointments without notifying the office, you may be dismissed from the clinic at the provider's discretion.      For prescription refill requests, have your pharmacy contact our office and allow 72 hours for refills to be completed.    Today you received the following chemotherapy and/or immunotherapy agents: Padcev   To help prevent nausea and vomiting after your treatment, we encourage you to take your nausea medication as directed.  BELOW ARE SYMPTOMS THAT SHOULD BE REPORTED IMMEDIATELY: *FEVER GREATER THAN 100.4 F (38 C) OR HIGHER *CHILLS OR SWEATING *NAUSEA AND VOMITING THAT IS NOT CONTROLLED WITH YOUR NAUSEA MEDICATION *UNUSUAL SHORTNESS OF BREATH *UNUSUAL BRUISING OR BLEEDING *URINARY PROBLEMS (pain or burning when urinating, or frequent urination) *BOWEL PROBLEMS (unusual diarrhea, constipation, pain near the anus) TENDERNESS IN MOUTH AND THROAT WITH OR WITHOUT PRESENCE OF ULCERS (sore throat, sores in mouth, or a toothache) UNUSUAL RASH, SWELLING OR PAIN  UNUSUAL VAGINAL DISCHARGE OR ITCHING   Items with * indicate a potential emergency and should be followed up as soon as possible or go to the Emergency Department if any problems should occur.  Please show the CHEMOTHERAPY ALERT CARD or IMMUNOTHERAPY ALERT CARD at check-in to the  Emergency Department and triage nurse.  Should you have questions after your visit or need to cancel or reschedule your appointment, please contact [facility]  Dept: [phone]  and follow the prompts.  Office hours are 8:00 a.m. to 4:30 p.m. Monday - Friday. Please note that voicemails left after 4:00 p.m. may not be returned until the following business day.  We are closed weekends and major holidays. You have access to a nurse at all times for urgent questions. Please call the main number to the clinic Dept: [phone] and follow the prompts.   For any non-urgent questions, you may also contact your provider using MyChart. We now offer e-Visits for anyone 18 and older to request care online for non-urgent symptoms. For details visit [email or web address].   Also download the MyChart app! Go to the app store, search "MyChart", open the app, select [facility], and log in with your MyChart username and password.  Due to Covid, a mask is required upon entering the hospital/clinic. If you do not have a mask, one will be given to you upon arrival. For doctor visits, patients may have 1 support person aged 18 or older with them. For treatment visits, patients cannot have anyone with them due to current Covid guidelines and our immunocompromised population.   

## 2021-02-12 NOTE — Progress Notes (Signed)
Ok to treat today with Padcev with Hemoglobin 7.6 per Dr. Alen Blew

## 2021-02-12 NOTE — Addendum Note (Signed)
Addended by: Ishmael Holter on: 02/12/2021 03:14 PM   Modules accepted: Orders

## 2021-02-12 NOTE — Progress Notes (Signed)
Pharmacist Chemotherapy Monitoring - Initial Assessment    Anticipated start date: 02/19/21   The following has been reviewed per standard work regarding the patient's treatment regimen: The patient's diagnosis, treatment plan and drug doses, and organ/hematologic function Lab orders and baseline tests specific to treatment regimen  The treatment plan start date, drug sequencing, and pre-medications Prior authorization status  Patient's documented medication list, including drug-drug interaction screen and prescriptions for anti-emetics and supportive care specific to the treatment regimen The drug concentrations, fluid compatibility, administration routes, and timing of the medications to be used The patient's access for treatment and lifetime cumulative dose history, if applicable  The patient's medication allergies and previous infusion related reactions, if applicable   Changes made to treatment plan:  N/A  Follow up needed:  N/A   Lawrence Hall K, RPH, 02/12/2021  3:21 PM

## 2021-02-12 NOTE — Progress Notes (Signed)
Hematology and Oncology Follow Up Visit  Lawrence Hall [id number] 06/01/1945 75 y.o. 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   Prior Therapy: He is status post biopsy obtained on Aug 21, 2019 which showed high-grade urothelial carcinoma of the left renal pelvis.   He status post neoadjuvant chemotherapy utilizing gemcitabine and cisplatin given on day 1 and cisplatin given on day 8 start her on 10/06/2019.   He completed 3 cycles of therapy in August 2021.  He is status post left robotic assisted laparoscopic nephroureterectomy and retroperitoneal lymphadenectomy completed on 01/04/2020.  He was found to have a residual T4 disease with 1 out of 5 lymph nodes involved.  He is status post tumor resection from his right humerus with surgical fixation and rodding completed by Dr. Lovena Hall at [place] completed in June 2022.  The final pathology confirmed the presence of metastatic urothelial carcinoma.  Radiation therapy to be right humerus.  He completed 10 fractions for a total of 30 Gray completed in July 2022.  Pembrolizumab 200 mg started on Sep 13, 2020.  He is currently receiving 400 mg every 6 weeks.  He completed cycle 3 on December 13, 2020.  Current therapy: Padcev 1 mg/kg every other week with cycle 1 starting on 02/12/2021.  He will receive treatment day 1 and day 15 out of a 28-day cycle starting on February 12, 2021.      Interim History: Lawrence Hall returns today for a follow-up visit.  Since the last visit, he continues to experience difficulties with failure to thrive and dehydration but has benefited from IV fluids yesterday.  He had denies any nausea or vomiting but has reported some dyspepsia.  He is using Remeron at nighttime which helps his sleep, mood but not his appetite as of  yet.     Medications: Reviewed without changes. Current Outpatient Medications  Medication Sig Dispense Refill   acetaminophen (TYLENOL) 500 MG tablet Take 500 mg by mouth as needed for moderate pain.     aspirin EC 81 MG tablet Take 1 tablet (81 mg total) by mouth daily. Swallow whole. 30 tablet 11   atorvastatin (LIPITOR) 80 MG tablet TAKE 1 TABLET ONCE DAILY AT 6 PM. (Patient taking differently: Take 80 mg by mouth daily.) 90 tablet 3   cholecalciferol (VITAMIN D3) 25 MCG (1000 UNIT) tablet Take 1,000 Units by mouth daily.     HYDROmorphone (DILAUDID) 2 MG tablet Take 0.5 tablets (1 mg total) by mouth every 6 (six) hours as needed for severe pain. 10 tablet 0   metoprolol succinate (TOPROL-XL) 50 MG 24 hr tablet Take 1 tablet (50 mg total) by mouth at bedtime. 90 tablet 3   Milk Thistle 1000 MG CAPS Take 1 capsule by mouth daily.     mirtazapine (REMERON) 15 MG tablet Take 1 tablet (15 mg total) by mouth at bedtime. 90 tablet 1   nitroGLYCERIN (NITROSTAT) 0.4 MG SL tablet 1 TAB UNDER TONGUE AS NEEDED FOR CHEST PAIN. MAY REPEAT EVERY 5 MIN FOR A TOTAL OF 3 DOSES. (Patient taking differently: Place 0.4 mg under the tongue every 5 (five) minutes as needed for chest pain.) 25 tablet 4   oxyCODONE (OXY IR/ROXICODONE) 5 MG immediate release tablet Take 1 tablet (5 mg total) by mouth every 4 (four) hours as needed for breakthrough pain or moderate  pain. 20 tablet 0   polyethylene glycol (MIRALAX / GLYCOLAX) 17 g packet Take 17 g by mouth daily. 14 each 0   prochlorperazine (COMPAZINE) 10 MG tablet TAKE 1 TABLET EVERY 6 HOURS AS NEEDED FOR NAUSEA & VOMITING. 30 tablet 0   No current facility-administered medications for this visit.     Allergies:  Allergies  Allergen Reactions   Other Itching and Other (See Comments)    Pet dander, seasonal allergies = Runny nose, itchy eyes      Physical Exam:    Blood pressure 107/74, pulse 100, temperature (!) 96.8 F (36 C), temperature source  Tympanic, resp. rate 18, height 6' (1.829 m), weight 157 lb 11.2 oz (71.5 kg), SpO2 100 %.     ECOG: 2    General appearance: Comfortable appearing without any discomfort Head: Normocephalic without any trauma Oropharynx: Mucous membranes are moist and pink without any thrush or ulcers. Eyes: Pupils are equal and round reactive to light. Lymph nodes: No cervical, supraclavicular, inguinal or axillary lymphadenopathy.   Heart:regular rate and rhythm.  S1 and S2 without leg edema. Lung: Clear without any rhonchi or wheezes.  No dullness to percussion. Abdomin: Soft, nontender, nondistended with good bowel sounds.  No hepatosplenomegaly. Musculoskeletal: No joint deformity or effusion.  Full range of motion noted. Neurological: No deficits noted on motor, sensory and deep tendon reflex exam. Skin: No petechial rash or dryness.  Appeared moist.             Lab Results: Lab Results  Component Value Date   WBC 9.5 02/11/2021   HGB 9.0 (L) 02/11/2021   HCT 28.7 (L) 02/11/2021   MCV 83.9 02/11/2021   PLT 296 02/11/2021     Chemistry      Component Value Date/Time   NA 138 02/11/2021 1135   NA 137 04/07/2019 1021   K 3.8 02/11/2021 1135   CL 105 02/11/2021 1135   CO2 20 (L) 02/11/2021 1135   BUN 37 (H) 02/11/2021 1135   BUN 29 (H) 04/07/2019 1021   CREATININE 1.49 (H) 02/11/2021 1135   CREATININE 1.32 (H) 02/05/2021 1104      Component Value Date/Time   CALCIUM 9.4 02/11/2021 1135   ALKPHOS 135 (H) 02/11/2021 1135   AST 30 02/11/2021 1135   AST 28 02/05/2021 1104   ALT 18 02/11/2021 1135   ALT 22 02/05/2021 1104   BILITOT 1.6 (H) 02/11/2021 1135   BILITOT 1.1 02/05/2021 1104         Impression and Plan:  75 year old man with:     1.  Left renal pelvis tumor diagnosed in 2021.  He developed stage IV high-grade urothelial carcinoma with hepatic and bone involvement.   His disease status was updated again and treatment choices were reviewed.  He continues  to experience clinical decline in his overall health and the option of starting salvage chemotherapy versus palliative care was reiterated.  After discussion today, he opted to proceed with Padcev and will reevaluate this treatment regularly with potentially repeat scan in 2 months.     2.  IV access: Port-A-Cath continue to be in use without any issues.    3.  Weight loss, dehydration and failure to thrive: We will continue with intermittent hydration between chemotherapy to avoid hospitalization if possible.  This inevitably related to disease progression.      4.  Anemia: Related to malignancy and will continue to follow and transfuse as needed.  Risks and benefits of transfusion were  discussed at this time and will await results of his laboratory testing to arrange for the.   5.  Prognosis and goals of care: Therapy remains palliative at this time and he is approaching end-stage status and need for hospice.  This will continue to be reiterated.   6.  Follow-up: He will continue to follow weekly for hydration in addition to every other week with chemotherapy.     30  minutes were dedicated to this encounter.  The time was spent on reviewing laboratory data, disease status update and outlining future plan of care.       [name], MD 10/26/202212:39 PM

## 2021-02-15 ENCOUNTER — Other Ambulatory Visit: Payer: Self-pay

## 2021-02-15 ENCOUNTER — Emergency Department (HOSPITAL_COMMUNITY): Payer: PPO

## 2021-02-15 ENCOUNTER — Inpatient Hospital Stay (HOSPITAL_COMMUNITY)
Admission: EM | Admit: 2021-02-15 | Discharge: 2021-02-22 | DRG: 640 | Disposition: A | Discharge status: Hospice | Payer: PPO | Attending: Internal Medicine | Admitting: Internal Medicine

## 2021-02-15 ENCOUNTER — Encounter (HOSPITAL_COMMUNITY): Payer: Self-pay

## 2021-02-15 DIAGNOSIS — D649 Anemia, unspecified: Secondary | ICD-10-CM | POA: Diagnosis not present

## 2021-02-15 DIAGNOSIS — R079 Chest pain, unspecified: Secondary | ICD-10-CM | POA: Diagnosis not present

## 2021-02-15 DIAGNOSIS — E785 Hyperlipidemia, unspecified: Secondary | ICD-10-CM | POA: Diagnosis present

## 2021-02-15 DIAGNOSIS — E43 Unspecified severe protein-calorie malnutrition: Secondary | ICD-10-CM | POA: Insufficient documentation

## 2021-02-15 DIAGNOSIS — Z8249 Family history of ischemic heart disease and other diseases of the circulatory system: Secondary | ICD-10-CM

## 2021-02-15 DIAGNOSIS — C652 Malignant neoplasm of left renal pelvis: Secondary | ICD-10-CM | POA: Diagnosis present

## 2021-02-15 DIAGNOSIS — D6481 Anemia due to antineoplastic chemotherapy: Secondary | ICD-10-CM | POA: Diagnosis present

## 2021-02-15 DIAGNOSIS — I10 Essential (primary) hypertension: Secondary | ICD-10-CM | POA: Diagnosis present

## 2021-02-15 DIAGNOSIS — Z20822 Contact with and (suspected) exposure to covid-19: Secondary | ICD-10-CM | POA: Diagnosis present

## 2021-02-15 DIAGNOSIS — C787 Secondary malignant neoplasm of liver and intrahepatic bile duct: Secondary | ICD-10-CM | POA: Diagnosis present

## 2021-02-15 DIAGNOSIS — Z803 Family history of malignant neoplasm of breast: Secondary | ICD-10-CM

## 2021-02-15 DIAGNOSIS — Z951 Presence of aortocoronary bypass graft: Secondary | ICD-10-CM

## 2021-02-15 DIAGNOSIS — C679 Malignant neoplasm of bladder, unspecified: Secondary | ICD-10-CM | POA: Diagnosis present

## 2021-02-15 DIAGNOSIS — I493 Ventricular premature depolarization: Secondary | ICD-10-CM | POA: Diagnosis present

## 2021-02-15 DIAGNOSIS — I2510 Atherosclerotic heart disease of native coronary artery without angina pectoris: Secondary | ICD-10-CM | POA: Diagnosis present

## 2021-02-15 DIAGNOSIS — C649 Malignant neoplasm of unspecified kidney, except renal pelvis: Secondary | ICD-10-CM | POA: Diagnosis present

## 2021-02-15 DIAGNOSIS — C7951 Secondary malignant neoplasm of bone: Secondary | ICD-10-CM | POA: Diagnosis present

## 2021-02-15 DIAGNOSIS — E860 Dehydration: Principal | ICD-10-CM | POA: Diagnosis present

## 2021-02-15 DIAGNOSIS — I447 Left bundle-branch block, unspecified: Secondary | ICD-10-CM | POA: Diagnosis present

## 2021-02-15 DIAGNOSIS — Z79899 Other long term (current) drug therapy: Secondary | ICD-10-CM

## 2021-02-15 DIAGNOSIS — Z85828 Personal history of other malignant neoplasm of skin: Secondary | ICD-10-CM

## 2021-02-15 DIAGNOSIS — D630 Anemia in neoplastic disease: Secondary | ICD-10-CM | POA: Diagnosis present

## 2021-02-15 DIAGNOSIS — Z811 Family history of alcohol abuse and dependence: Secondary | ICD-10-CM

## 2021-02-15 DIAGNOSIS — Z8049 Family history of malignant neoplasm of other genital organs: Secondary | ICD-10-CM

## 2021-02-15 DIAGNOSIS — R112 Nausea with vomiting, unspecified: Secondary | ICD-10-CM | POA: Diagnosis present

## 2021-02-15 DIAGNOSIS — Z6821 Body mass index (BMI) 21.0-21.9, adult: Secondary | ICD-10-CM

## 2021-02-15 DIAGNOSIS — I951 Orthostatic hypotension: Secondary | ICD-10-CM | POA: Diagnosis present

## 2021-02-15 DIAGNOSIS — R531 Weakness: Secondary | ICD-10-CM

## 2021-02-15 DIAGNOSIS — R627 Adult failure to thrive: Secondary | ICD-10-CM | POA: Diagnosis present

## 2021-02-15 DIAGNOSIS — Z7982 Long term (current) use of aspirin: Secondary | ICD-10-CM | POA: Diagnosis not present

## 2021-02-15 DIAGNOSIS — Z515 Encounter for palliative care: Secondary | ICD-10-CM | POA: Diagnosis not present

## 2021-02-15 DIAGNOSIS — E876 Hypokalemia: Secondary | ICD-10-CM | POA: Diagnosis present

## 2021-02-15 DIAGNOSIS — T451X5A Adverse effect of antineoplastic and immunosuppressive drugs, initial encounter: Secondary | ICD-10-CM | POA: Diagnosis present

## 2021-02-15 DIAGNOSIS — R1111 Vomiting without nausea: Secondary | ICD-10-CM | POA: Diagnosis not present

## 2021-02-15 DIAGNOSIS — E861 Hypovolemia: Secondary | ICD-10-CM | POA: Diagnosis present

## 2021-02-15 DIAGNOSIS — Z8042 Family history of malignant neoplasm of prostate: Secondary | ICD-10-CM | POA: Diagnosis not present

## 2021-02-15 DIAGNOSIS — K5900 Constipation, unspecified: Secondary | ICD-10-CM | POA: Diagnosis present

## 2021-02-15 DIAGNOSIS — Z952 Presence of prosthetic heart valve: Secondary | ICD-10-CM

## 2021-02-15 DIAGNOSIS — Y92009 Unspecified place in unspecified non-institutional (private) residence as the place of occurrence of the external cause: Secondary | ICD-10-CM

## 2021-02-15 DIAGNOSIS — R55 Syncope and collapse: Secondary | ICD-10-CM | POA: Diagnosis present

## 2021-02-15 LAB — COMPREHENSIVE METABOLIC PANEL
ALT: 15 U/L (ref 0–44)
AST: 28 U/L (ref 15–41)
Albumin: 3.1 g/dL — ABNORMAL LOW (ref 3.5–5.0)
Alkaline Phosphatase: 109 U/L (ref 38–126)
Anion gap: 12 (ref 5–15)
BUN: 28 mg/dL — ABNORMAL HIGH (ref 8–23)
CO2: 21 mmol/L — ABNORMAL LOW (ref 22–32)
Calcium: 9.2 mg/dL (ref 8.9–10.3)
Chloride: 104 mmol/L (ref 98–111)
Creatinine, Ser: 1.03 mg/dL (ref 0.61–1.24)
GFR, Estimated: >60 mL/min (ref >60)
Glucose, Bld: 138 mg/dL — ABNORMAL HIGH (ref 70–99)
Potassium: 3.7 mmol/L (ref 3.5–5.1)
Sodium: 137 mmol/L (ref 135–145)
Total Bilirubin: 1.3 mg/dL — ABNORMAL HIGH (ref 0.3–1.2)
Total Protein: 7.5 g/dL (ref 6.5–8.1)

## 2021-02-15 LAB — CBC WITH DIFFERENTIAL/PLATELET
Abs Immature Granulocytes: 0.07 10*3/uL (ref 0.00–0.07)
Basophils Absolute: 0 10*3/uL (ref 0.0–0.1)
Basophils Relative: 0 %
Eosinophils Absolute: 0 10*3/uL (ref 0.0–0.5)
Eosinophils Relative: 0 %
HCT: 27.1 % — ABNORMAL LOW (ref 39.0–52.0)
Hemoglobin: 8.4 g/dL — ABNORMAL LOW (ref 13.0–17.0)
Immature Granulocytes: 1 %
Lymphocytes Relative: 6 %
Lymphs Abs: 0.6 10*3/uL — ABNORMAL LOW (ref 0.7–4.0)
MCH: 25.8 pg — ABNORMAL LOW (ref 26.0–34.0)
MCHC: 31 g/dL (ref 30.0–36.0)
MCV: 83.4 fL (ref 80.0–100.0)
Monocytes Absolute: 1 10*3/uL (ref 0.1–1.0)
Monocytes Relative: 9 %
Neutro Abs: 8.7 10*3/uL — ABNORMAL HIGH (ref 1.7–7.7)
Neutrophils Relative %: 84 %
Platelets: 264 10*3/uL (ref 150–400)
RBC: 3.25 MIL/uL — ABNORMAL LOW (ref 4.22–5.81)
RDW: 17.3 % — ABNORMAL HIGH (ref 11.5–15.5)
WBC: 10.4 10*3/uL (ref 4.0–10.5)
nRBC: 0 % (ref 0.0–0.2)

## 2021-02-15 LAB — TROPONIN I (HIGH SENSITIVITY)
Troponin I (High Sensitivity): 8 ng/L (ref <18)
Troponin I (High Sensitivity): 8 ng/L (ref <18)

## 2021-02-15 LAB — RESP PANEL BY RT-PCR (FLU A&B, COVID) ARPGX2
Influenza A by PCR: NEGATIVE
Influenza B by PCR: NEGATIVE
SARS Coronavirus 2 by RT PCR: NEGATIVE

## 2021-02-15 LAB — LACTIC ACID, PLASMA: Lactic Acid, Venous: 1.1 mmol/L (ref 0.5–1.9)

## 2021-02-15 LAB — BRAIN NATRIURETIC PEPTIDE: B Natriuretic Peptide: 99.5 pg/mL (ref 0.0–100.0)

## 2021-02-15 MED ORDER — SODIUM CHLORIDE 0.9% FLUSH
3.0000 mL | Freq: Two times a day (BID) | INTRAVENOUS | Status: DC
  Administered 2021-02-15 – 2021-02-22 (×12): 3 mL via INTRAVENOUS

## 2021-02-15 MED ORDER — LACTATED RINGERS IV SOLN: INTRAVENOUS | Status: AC

## 2021-02-15 MED ORDER — SODIUM CHLORIDE 0.9 % IV BOLUS
1000.0000 mL | Freq: Once | INTRAVENOUS | Status: AC
  Administered 2021-02-15: 1000 mL via INTRAVENOUS

## 2021-02-15 MED ORDER — ONDANSETRON HCL 4 MG/2ML IJ SOLN
4.0000 mg | Freq: Four times a day (QID) | INTRAMUSCULAR | Status: DC | PRN
  Administered 2021-02-15 – 2021-02-16 (×3): 4 mg via INTRAVENOUS
  Filled 2021-02-15 (×3): qty 2

## 2021-02-15 MED ORDER — METOCLOPRAMIDE HCL 5 MG/ML IJ SOLN
10.0000 mg | Freq: Once | INTRAMUSCULAR | Status: AC
  Administered 2021-02-15: 10 mg via INTRAVENOUS
  Filled 2021-02-15: qty 2

## 2021-02-15 MED ORDER — ACETAMINOPHEN 650 MG RE SUPP: 650.0000 mg | Freq: Four times a day (QID) | RECTAL | Status: DC | PRN

## 2021-02-15 MED ORDER — METOCLOPRAMIDE HCL 5 MG/ML IJ SOLN
10.0000 mg | Freq: Three times a day (TID) | INTRAMUSCULAR | Status: DC
  Administered 2021-02-15 – 2021-02-18 (×8): 10 mg via INTRAVENOUS
  Filled 2021-02-15 (×8): qty 2

## 2021-02-15 MED ORDER — ASPIRIN EC 81 MG PO TBEC
81.0000 mg | DELAYED_RELEASE_TABLET | Freq: Every day | ORAL | Status: DC
  Administered 2021-02-16 – 2021-02-22 (×7): 81 mg via ORAL
  Filled 2021-02-15 (×7): qty 1

## 2021-02-15 MED ORDER — POLYETHYLENE GLYCOL 3350 17 G PO PACK: 17.0000 g | PACK | Freq: Every day | ORAL | Status: DC

## 2021-02-15 MED ORDER — ENOXAPARIN SODIUM 40 MG/0.4ML IJ SOSY
40.0000 mg | PREFILLED_SYRINGE | INTRAMUSCULAR | Status: DC
  Administered 2021-02-15 – 2021-02-20 (×6): 40 mg via SUBCUTANEOUS
  Filled 2021-02-15 (×7): qty 0.4

## 2021-02-15 MED ORDER — SENNOSIDES-DOCUSATE SODIUM 8.6-50 MG PO TABS
1.0000 | ORAL_TABLET | Freq: Every evening | ORAL | Status: DC | PRN
  Administered 2021-02-16: 1 via ORAL
  Filled 2021-02-15: qty 1

## 2021-02-15 MED ORDER — ATORVASTATIN CALCIUM 40 MG PO TABS
80.0000 mg | ORAL_TABLET | Freq: Every day | ORAL | Status: DC
  Administered 2021-02-16 – 2021-02-21 (×5): 80 mg via ORAL
  Filled 2021-02-15 (×5): qty 2

## 2021-02-15 MED ORDER — MELATONIN 3 MG PO TABS
3.0000 mg | ORAL_TABLET | Freq: Every day | ORAL | Status: DC
  Administered 2021-02-15 – 2021-02-21 (×7): 3 mg via ORAL
  Filled 2021-02-15 (×7): qty 1

## 2021-02-15 MED ORDER — ONDANSETRON HCL 4 MG PO TABS
4.0000 mg | ORAL_TABLET | Freq: Four times a day (QID) | ORAL | Status: DC | PRN
  Administered 2021-02-22: 4 mg via ORAL
  Filled 2021-02-15: qty 1

## 2021-02-15 MED ORDER — IOHEXOL 350 MG/ML SOLN
80.0000 mL | Freq: Once | INTRAVENOUS | Status: AC | PRN
  Administered 2021-02-15: 80 mL via INTRAVENOUS

## 2021-02-15 MED ORDER — MIRTAZAPINE 15 MG PO TABS
15.0000 mg | ORAL_TABLET | Freq: Every day | ORAL | Status: DC
  Administered 2021-02-15 – 2021-02-21 (×7): 15 mg via ORAL
  Filled 2021-02-15 (×3): qty 1
  Filled 2021-02-15: qty 2
  Filled 2021-02-15 (×3): qty 1

## 2021-02-15 MED ORDER — ACETAMINOPHEN 325 MG PO TABS
650.0000 mg | ORAL_TABLET | Freq: Four times a day (QID) | ORAL | Status: DC | PRN
  Administered 2021-02-16: 650 mg via ORAL
  Filled 2021-02-15: qty 2

## 2021-02-15 NOTE — ED Notes (Signed)
Pt c/o nausea and chills.

## 2021-02-15 NOTE — ED Provider Notes (Signed)
[facility] DEPT Provider Note   CSN: [id number] Arrival date & time: 02/15/21  1328     History Chief Complaint  Patient presents with   Loss of Consciousness   Emesis    Lawrence Hall is a 75 y.o. male.  HPI Patient presents after episode of syncope, with ongoing weakness. Patient has multiple medical issues, most importantly ongoing chemotherapy for metastatic renal cancer. This morning, and the hours prior to ED arrival he had an episode of syncope.  He has had several days of the past few months has been seen and evaluated multiple prior times.  Today with ongoing weakness the lost consciousness for few moments.  Around the time he developed back pain, that was constant, but is not currently present.  There is some associated dyspnea, as well as nausea, but no focal chest pain.  He notes that beyond his chemotherapy no recent medication changes. Wife notes that during this illness the patient has lost greater than 75 pounds, has been progressively weaker, with diminished p.o. intake.    Past Medical History:  Diagnosis Date   Arthritis    mild hands   Asthma    pt denies 4/21    Chest pain 07/25/2012   Coronary artery disease    Dysrhythmia    PAC'S, hx of atrial fib after OHS 04/2018   ED (erectile dysfunction)    H/O hiatal hernia    Heart murmur    Hypercholesterolemia    Hypertension    MARGINALLY HIGH - NO MEDS -MONITORS   Metastatic cancer to bone ([place]) dx'd 07/2020   rt humerus   Myocardial infarction ([place])    2017    Pneumonia    hx of 2018    PONV (postoperative nausea and vomiting)    Prostate cancer ([place]) 03/27/2011   prostate bx=adenocarcinoma,   Skin cancer 2008   basal cell face /removed   Sleep apnea    borderline sleep apnea per patient no devices    Urothelial cancer ([place]) dx'd 05/2019   left renal ca    Patient Active Problem List   Diagnosis Date Noted   Intractable nausea and vomiting 01/24/2021   Intractable  vomiting with nausea 01/23/2021   Normocytic anemia 01/23/2021   Hyponatremia 01/23/2021   Metastasis to bone ([place]) 10/30/2020   Goals of care, counseling/discussion 09/06/2020   Malignant tumor of renal pelvis, left ([place]) 01/04/2020   Port-A-Cath in place 11/02/2019   Right rotator cuff tear 09/19/2019   History of aortic valve replacement with bioprosthetic valve 05/18/2018   Postoperative atrial fibrillation ([place]) 05/18/2018   LBBB (left bundle branch block) 05/18/2018   S/P CABG x 3 04/29/2018   CAD (coronary artery disease), native coronary artery 04/26/2018   Near syncope    Nonrheumatic aortic valve stenosis    Non-STEMI (non-ST elevated myocardial infarction) ([place] [facility])    Essential hypertension 06/01/2015   Asthma 05/31/2015   Elevated troponin 05/31/2015   Arthritis    Hyperlipidemia    ED (erectile dysfunction)     Past Surgical History:  Procedure Laterality Date   AORTIC VALVE REPLACEMENT N/A 04/29/2018   Procedure: AORTIC VALVE REPLACEMENT (AVR) using INSPIRIS RESILIA  AORTIC VALVE 14mm;  Surgeon: Ivin Poot, MD;  Location: [facility];  Service: Open Heart Surgery;  Laterality: N/A;   basal cell cancer  2008   inside nose   CARDIAC CATHETERIZATION N/A 06/03/2015   Procedure: Left Heart Cath and Coronary Angiography;  Surgeon: [name] Sine,  MD;  Location: [facility];  Service: Cardiovascular;  Laterality: N/A;   CATARACT EXTRACTION, BILATERAL  2007   CORONARY ARTERY BYPASS GRAFT N/A 04/29/2018   Procedure: CORONARY ARTERY BYPASS GRAFTING (CABG) X 3; Using Left Internal Mammary Artery (LIMA) and Right Leg Greater Saphenous Vein Graft (SVG);  LIMA to LAD, SVG to OM1, SVG to RCA,;  Surgeon: Ivin Poot, MD;  Location: [facility];  Service: Open Heart Surgery;  Laterality: N/A;   CYSTOSCOPY/RETROGRADE/URETEROSCOPY Left 08/21/2019   Procedure: CYSTOSCOPY/RETROGRADE/URETEROSCOPY/  LEFT BIOPSY/BRUSH BIOPSY/RENAL WASHINGS/ AND STENT PLACEMENT;  Surgeon: Raynelle Bring, MD;   Location: [facility];  Service: Urology;  Laterality: Left;   IR IMAGING GUIDED PORT INSERTION  09/28/2019   RIGHT/LEFT HEART CATH AND CORONARY ANGIOGRAPHY N/A 04/26/2018   Procedure: RIGHT/LEFT HEART CATH AND CORONARY ANGIOGRAPHY;  Surgeon: [name] Sine, MD;  Location: [facility];  Service: Cardiovascular;  Laterality: N/A;   ROBOT ASSISTED LAPAROSCOPIC RADICAL PROSTATECTOMY  08/10/2011   Procedure: ROBOTIC ASSISTED LAPAROSCOPIC RADICAL PROSTATECTOMY LEVEL 2;  Surgeon: Dutch Gray, MD;  Location: [facility];  Service: Urology;  Laterality: N/A;   ROBOT ASSITED LAPAROSCOPIC NEPHROURETERECTOMY Left 01/04/2020   Procedure: XI ROBOT ASSITED LAPAROSCOPIC NEPHROURETERECTOMY;  Surgeon: Raynelle Bring, MD;  Location: [facility];  Service: Urology;  Laterality: Left;  NEEDS 240 MIN FOR PROCEDURE   TEE WITHOUT CARDIOVERSION N/A 04/29/2018   Procedure: TRANSESOPHAGEAL ECHOCARDIOGRAM (TEE);  Surgeon: Prescott Gum, Collier Salina, MD;  Location: [facility];  Service: Open Heart Surgery;  Laterality: N/A;   THYROID SURGERY  infant   uncertain details; no thyroid replacement listed   WISDOM TOOTH EXTRACTION         Family History  Problem Relation Age of Onset   Prostate cancer Father 31       in his 70's/other comorbidities/79 deceased   Alcohol abuse Father    Liver disease Father    Heart attack Father 56   Breast cancer Mother 69       mets to bone,deceased age 65   Cervical cancer Sister    Cervical cancer Sister    Breast cancer Sister    CAD Brother        Died of sudden cardiac death/MI    Social History   Tobacco Use   Smoking status: Never   Smokeless tobacco: Never  Vaping Use   Vaping Use: Never used  Substance Use Topics   Alcohol use: Not Currently   Drug use: No    Home Medications Prior to Admission medications   Medication Sig Start Date End Date Taking? Authorizing Provider  acetaminophen (TYLENOL) 500 MG tablet Take 500 mg by mouth as needed for moderate pain.    [provider]   aspirin EC 81 MG tablet Take 1 tablet (81 mg total) by mouth daily. Swallow whole. 01/24/20   [name], [name], MD  atorvastatin (LIPITOR) 80 MG tablet TAKE 1 TABLET ONCE DAILY AT 6 PM. Patient taking differently: Take 80 mg by mouth daily. 07/25/20   [name], Mihai, MD  cholecalciferol (VITAMIN D3) 25 MCG (1000 UNIT) tablet Take 1,000 Units by mouth daily.    [provider]  HYDROmorphone (DILAUDID) 2 MG tablet Take 0.5 tablets (1 mg total) by mouth every 6 (six) hours as needed for severe pain. 01/26/21   Arrien, Jimmy Picket, MD  metoprolol succinate (TOPROL-XL) 50 MG 24 hr tablet Take 1 tablet (50 mg total) by mouth at bedtime. 02/06/20   [name], Dani Gobble, MD  Milk Thistle  1000 MG CAPS Take 1 capsule by mouth daily.    [provider]  mirtazapine (REMERON) 15 MG tablet Take 1 tablet (15 mg total) by mouth at bedtime. 02/03/21   [name], [name], MD  nitroGLYCERIN (NITROSTAT) 0.4 MG SL tablet 1 TAB UNDER TONGUE AS NEEDED FOR CHEST PAIN. MAY REPEAT EVERY 5 MIN FOR A TOTAL OF 3 DOSES. Patient taking differently: Place 0.4 mg under the tongue every 5 (five) minutes as needed for chest pain. 06/21/19   [name], Mihai, MD  oxyCODONE (OXY IR/ROXICODONE) 5 MG immediate release tablet Take 1 tablet (5 mg total) by mouth every 4 (four) hours as needed for breakthrough pain or moderate pain. 01/26/21   Arrien, Jimmy Picket, MD  polyethylene glycol (MIRALAX / GLYCOLAX) 17 g packet Take 17 g by mouth daily. 01/26/21   Arrien, Jimmy Picket, MD  prochlorperazine (COMPAZINE) 10 MG tablet TAKE 1 TABLET EVERY 6 HOURS AS NEEDED FOR NAUSEA & VOMITING. 01/27/21   Wyatt Portela, MD    Allergies    Other  Review of Systems   Review of Systems  Constitutional:        Per HPI, otherwise negative  HENT:         Per HPI, otherwise negative  Respiratory:         Per HPI, otherwise negative  Cardiovascular:        Per HPI, otherwise negative  Gastrointestinal:  Negative for  vomiting.  Endocrine:       Negative aside from HPI  Genitourinary:        Neg aside from HPI   Musculoskeletal:        Per HPI, otherwise negative  Skin: Negative.   Allergic/Immunologic: Positive for immunocompromised state.  Neurological:  Positive for syncope.   Physical Exam Updated Vital Signs BP 136/87   Pulse (!) 107   Temp (!) 97.4 F (36.3 C) (Oral)   Resp (!) 23   Ht 6' (1.829 m)   Wt 71.5 kg   SpO2 99%   BMI 21.39 kg/m   Physical Exam Vitals and nursing note reviewed.  Constitutional:      General: He is not in acute distress.    Appearance: He is well-developed.  HENT:     Head: Normocephalic and atraumatic.  Eyes:     Conjunctiva/sclera: Conjunctivae normal.  Cardiovascular:     Rate and Rhythm: Regular rhythm. Tachycardia present.  Pulmonary:     Effort: Tachypnea present. No respiratory distress.     Breath sounds: No stridor.  Abdominal:     General: There is no distension.  Skin:    General: Skin is warm and dry.  Neurological:     Mental Status: He is alert and oriented to person, place, and time.    ED Results / Procedures / Treatments   Labs (all labs ordered are listed, but only abnormal results are displayed) Labs Reviewed  COMPREHENSIVE METABOLIC PANEL - Abnormal; Notable for the following components:      Result Value   CO2 21 (*)    Glucose, Bld 138 (*)    BUN 28 (*)    Albumin 3.1 (*)    Total Bilirubin 1.3 (*)    All other components within normal limits  CBC WITH DIFFERENTIAL/PLATELET - Abnormal; Notable for the following components:   RBC 3.25 (*)    Hemoglobin 8.4 (*)    HCT 27.1 (*)    MCH 25.8 (*)    RDW 17.3 (*)  Neutro Abs 8.7 (*)    Lymphs Abs 0.6 (*)    All other components within normal limits  RESP PANEL BY RT-PCR (FLU A&B, COVID) [id number]  CULTURE, BLOOD (ROUTINE X 2)  CULTURE, BLOOD (ROUTINE X 2)  BRAIN NATRIURETIC PEPTIDE  LACTIC ACID, PLASMA  TROPONIN I (HIGH SENSITIVITY)  TROPONIN I (HIGH SENSITIVITY)     EKG EKG Interpretation  Date/Time:  Saturday February 15 2021 14:36:16 EDT Ventricular Rate:  98 PR Interval:  219 QRS Duration: 122 QT Interval:  355 QTC Calculation: 295 R Axis:   46 Text Interpretation: Sinus tachycardia Atrial premature complexes Prolonged PR interval Left bundle branch block Abnormal ECG Confirmed by Carmin Muskrat [phone]) on 02/15/2021 3:37:36 PM  Radiology CT Angio Chest PE W and/or Wo Contrast  Result Date: 02/15/2021 CLINICAL DATA:  75 year old male with acute chest pain and syncope. EXAM: CT ANGIOGRAPHY CHEST WITH CONTRAST TECHNIQUE: Multidetector CT imaging of the chest was performed using the standard protocol during bolus administration of intravenous contrast. Multiplanar CT image reconstructions and MIPs were obtained to evaluate the vascular anatomy. CONTRAST:  73mL OMNIPAQUE IOHEXOL 350 MG/ML SOLN COMPARISON:  09/03/2020 and prior CTs FINDINGS: Cardiovascular: This is a technically adequate study but respiratory motion artifact in the LOWER lungs slightly decreases sensitivity. No pulmonary emboli are identified. CABG aortic valve replacement noted. Thoracic aortic atherosclerotic calcifications are noted without aneurysm. No pericardial effusion. A RIGHT IJ Port-A-Cath is noted with tip at the SUPERIOR cavoatrial junction. Mediastinum/Nodes: No enlarged mediastinal, hilar, or axillary lymph nodes. Thyroid gland, trachea, and esophagus demonstrate no significant findings. Lungs/Pleura: Lungs are clear. No pleural effusion or pneumothorax. Upper Abdomen: No acute abnormality. Musculoskeletal: No acute or suspicious bony abnormalities are noted. Surgical hardware within the proximal RIGHT humerus and median sternotomy wires are present. Review of the MIP images confirms the above findings. IMPRESSION: No evidence of acute abnormality. No evidence of pulmonary emboli. Aortic Atherosclerosis (ICD10-I70.0). Electronically Signed   By: Margarette Canada M.D.   On:  02/15/2021 16:58   DG Chest Portable 1 View  Result Date: 02/15/2021 CLINICAL DATA:  Syncopal episode. EXAM: PORTABLE CHEST 1 VIEW COMPARISON:  Chest CT 09/03/2020 FINDINGS: The cardiac silhouette, mediastinal and hilar contours are within normal limits. Stable surgical changes from bypass surgery. Right IJ power port is in good position without complicating features. No acute pulmonary findings. No pulmonary lesions or pleural effusions. The bony thorax is intact. IMPRESSION: No acute cardiopulmonary findings. Electronically Signed   By: Marijo Sanes M.D.   On: 02/15/2021 14:56    Procedures Procedures   Medications Ordered in ED Medications  sodium chloride 0.9 % bolus 1,000 mL (has no administration in time range)  sodium chloride 0.9 % bolus 1,000 mL (0 mLs Intravenous Stopped 02/15/21 1549)  metoCLOPramide (REGLAN) injection 10 mg (10 mg Intravenous Given 02/15/21 1549)  iohexol (OMNIPAQUE) 350 MG/ML injection 80 mL (80 mLs Intravenous Contrast Given 02/15/21 1607)    ED Course  I have reviewed the triage vital signs and the nursing notes.  Pertinent labs & imaging results that were available during my care of the patient were reviewed by me and considered in my medical decision making (see chart for details). 6:19 PM CT results reviewed, discussed with patient and his wife.  He is receiving fluids, and after initial bolus his heart rate has decreased from 115, now 105/110.  No additional episodes syncope, no current complaints.  7:31 PM Now, following 2 L fluid resuscitation heart rate remains roughly 100, patient  continues to weakness, nausea, and with consideration for his episode of syncope, p.o. intolerance, ongoing chemotherapy and immunocompromise status I discussed his case with internal medicine colleagues for admission for ongoing resuscitation, monitoring, management.  No early evidence for PE, ACS, sustained arrhythmia, bacteremia, sepsis.   MDM  Rules/Calculators/A&P MDM Number of Diagnoses or Management Options Dehydration: new, needed workup Syncope and collapse: new, needed workup Weakness: new, needed workup   Amount and/or Complexity of Data Reviewed Clinical lab tests: ordered and reviewed Tests in the radiology section of CPT: ordered and reviewed Tests in the medicine section of CPT: reviewed and ordered Decide to obtain previous medical records or to obtain history from someone other than the patient: yes Obtain history from someone other than the patient: yes Review and summarize past medical records: yes Discuss the patient with other providers: yes Independent visualization of images, tracings, or specimens: yes  Risk of Complications, Morbidity, and/or Mortality Presenting problems: high Diagnostic procedures: high Management options: high  Critical Care Total time providing critical care: < 30 minutes  Patient Progress Patient progress: stable   Final Clinical Impression(s) / ED Diagnoses Final diagnoses:  Syncope and collapse  Dehydration  Weakness     Carmin Muskrat, MD 02/15/21 1932

## 2021-02-15 NOTE — ED Triage Notes (Signed)
Per EMS- patient reports last chemo treatment was 3 days ago. Patient states he has not felt well since the chemo treatment.  Patient had a syncopal episode at 0400 today and has had N/V x 3 days

## 2021-02-15 NOTE — ED Provider Notes (Signed)
Emergency Medicine Provider Triage Evaluation Note  Lawrence Hall , a 75 y.o. male  was evaluated in triage.  Pt complains of syncopal episode.  Patient is undergoing chemo treatment, he l had a syncopal episode earlier today and fell forward hitting his head.  Denies any chest pain, endorses back pain that started suddenly about 5 hours ago and is constant.  No chest pain or shortness of breath, endorses nausea..  Review of Systems  Positive: Back pain, syncope, nausea, vomiting, dizziness Negative: Chest pain, shortness of breath  Physical Exam  BP 100/70 (BP Location: Left Arm)   Pulse (!) 115   Temp 98.7 F (37.1 C) (Oral)   Resp 18   Ht 6' (1.829 m)   Wt 71.5 kg   SpO2 99%   BMI 21.39 kg/m  Gen:   Unwell appearing, patient appears chronically ill.  Looks uncomfortable. Resp:  Patient mildly tachypneic MSK:   Moves extremities without difficulty  Other:  Tachycardic, radial pulse equal bilaterally  Medical Decision Making  Medically screening exam initiated at 1:43 PM.  Appropriate orders placed.  [name] was informed that the remainder of the evaluation will be completed by another provider, this initial triage assessment does not replace that evaluation, and the importance of remaining in the ED until their evaluation is complete.  Soft BP, tachycardia and tachypnea.  Will initiate sepsis work-up.  Patient will need a room, PE and aortic dissection are on the differential.   Sherrill Raring, PA-C 02/15/21 [place], [place], MD 02/16/21 [phone]

## 2021-02-15 NOTE — H&P (Signed)
History and Physical    Lawrence Hall QQI:[id number] DOB: [birth year] DOA: 02/15/2021  PCP: [name], MD  Patient coming from: Home via EMS  I have personally briefly reviewed patient's old medical records in [place]  Chief Complaint: Nausea, vomiting, syncopal episode  HPI: Lawrence Hall is a 75 y.o. male with medical history significant for stage IV urothelial carcinoma with hepatic and osseous metastases (s/p radical resection to right humerus) recently started on salvage chemotherapy, [name], moderate AS s/p AVR, postoperative A. fib without recurrence, first-degree AV block, LBBB, anemia of chronic disease, HTN, and HLD who presented to the ED for evaluation after syncopal episode around 0400 on 02/15/21.  Patient recently started on palliative chemotherapy with Padcev on 10/26.  He was hydrated before and after treatment and states that he did well for the first day.  However since then he has been having significant fatigue with generalized weakness, nausea with vomiting, inability maintain any adequate oral intake, and lightheadedness.  This morning around 4 AM he was walking towards the bathroom when he became very lightheaded and lost consciousness.  He says that he hit the front of his face on the floor which resulted in small abrasions to his right eyebrow and nose.  He says he remained on the floor most of the morning until his wife was able to call EMS.  He otherwise denies any chest pain, dyspnea, dysuria, diarrhea, focal weakness.  He has had a nearly 80 pound weight loss over the last 1.5 years.  ED Course:  Initial vitals showed BP 100/70, pulse 118, RR 18, temp 98.7 F, SPO2 99% on room air.  Labs show WBC 10.4, hemoglobin 8.4, platelets 264,000, sodium 137, potassium 3.7, bicarb 21, BUN 28, creatinine 1.03, serum glucose 138, AST 28, ALT 15, alkaline phosphatase 109, total bilirubin 1.3, BNP nine 9.5, high-sensitivity troponin 8x2, lactic acid 1.1.   Blood cultures collected and pending.  SARS-CoV-2 and influenza PCR's are negative.  Portable chest x-Hall is negative for focal consolidation, edema, or effusion.  Right IJ PowerPort appears in good position.  Stable post CABG changes noted.  CTA chest PE study is negative for evidence of PE or acute abnormality.  Patient was given 2 L normal saline, 10 mg IV Reglan.  The hospitalist service was consulted to admit for further evaluation and management.  Review of Systems: All systems reviewed and are negative except as documented in history of present illness above.   Past Medical History:  Diagnosis Date   Arthritis    mild hands   Asthma    pt denies 4/21    Chest pain 07/25/2012   Coronary artery disease    Dysrhythmia    PAC'S, hx of atrial fib after OHS 04/2018   ED (erectile dysfunction)    H/O hiatal hernia    Heart murmur    Hypercholesterolemia    Hypertension    MARGINALLY HIGH - NO MEDS -MONITORS   Metastatic cancer to bone ([place]) dx'd 07/2020   rt humerus   Myocardial infarction ([place])    2017    Pneumonia    hx of 2018    PONV (postoperative nausea and vomiting)    Prostate cancer ([place]) 03/27/2011   prostate bx=adenocarcinoma,   Skin cancer 2008   basal cell face /removed   Sleep apnea    borderline sleep apnea per patient no devices    Urothelial cancer ([place]) dx'd 05/2019   left renal ca  Past Surgical History:  Procedure Laterality Date   AORTIC VALVE REPLACEMENT N/A 04/29/2018   Procedure: AORTIC VALVE REPLACEMENT (AVR) using INSPIRIS RESILIA  AORTIC VALVE 17mm;  Surgeon: Ivin Poot, MD;  Location: [facility];  Service: Open Heart Surgery;  Laterality: N/A;   basal cell cancer  2008   inside nose   CARDIAC CATHETERIZATION N/A 06/03/2015   Procedure: Left Heart Cath and Coronary Angiography;  Surgeon: [name] Sine, MD;  Location: [facility];  Service: Cardiovascular;  Laterality: N/A;   CATARACT EXTRACTION, BILATERAL  2007   CORONARY ARTERY  BYPASS GRAFT N/A 04/29/2018   Procedure: CORONARY ARTERY BYPASS GRAFTING (CABG) X 3; Using Left Internal Mammary Artery (LIMA) and Right Leg Greater Saphenous Vein Graft (SVG);  LIMA to LAD, SVG to OM1, SVG to RCA,;  Surgeon: Ivin Poot, MD;  Location: [facility];  Service: Open Heart Surgery;  Laterality: N/A;   CYSTOSCOPY/RETROGRADE/URETEROSCOPY Left 08/21/2019   Procedure: CYSTOSCOPY/RETROGRADE/URETEROSCOPY/  LEFT BIOPSY/BRUSH BIOPSY/RENAL WASHINGS/ AND STENT PLACEMENT;  Surgeon: Raynelle Bring, MD;  Location: [facility];  Service: Urology;  Laterality: Left;   IR IMAGING GUIDED PORT INSERTION  09/28/2019   RIGHT/LEFT HEART CATH AND CORONARY ANGIOGRAPHY N/A 04/26/2018   Procedure: RIGHT/LEFT HEART CATH AND CORONARY ANGIOGRAPHY;  Surgeon: [name] Sine, MD;  Location: [facility];  Service: Cardiovascular;  Laterality: N/A;   ROBOT ASSISTED LAPAROSCOPIC RADICAL PROSTATECTOMY  08/10/2011   Procedure: ROBOTIC ASSISTED LAPAROSCOPIC RADICAL PROSTATECTOMY LEVEL 2;  Surgeon: Dutch Gray, MD;  Location: [facility];  Service: Urology;  Laterality: N/A;   ROBOT ASSITED LAPAROSCOPIC NEPHROURETERECTOMY Left 01/04/2020   Procedure: XI ROBOT ASSITED LAPAROSCOPIC NEPHROURETERECTOMY;  Surgeon: Raynelle Bring, MD;  Location: [facility];  Service: Urology;  Laterality: Left;  NEEDS 240 MIN FOR PROCEDURE   TEE WITHOUT CARDIOVERSION N/A 04/29/2018   Procedure: TRANSESOPHAGEAL ECHOCARDIOGRAM (TEE);  Surgeon: Prescott Gum, Collier Salina, MD;  Location: [facility];  Service: Open Heart Surgery;  Laterality: N/A;   THYROID SURGERY  infant   uncertain details; no thyroid replacement listed   WISDOM TOOTH EXTRACTION      Social History:  reports that he has never smoked. He has never used smokeless tobacco. He reports that he does not currently use alcohol. He reports that he does not use drugs.  Allergies  Allergen Reactions   Other Itching and Other (See Comments)    Pet dander, seasonal allergies = Runny nose, itchy eyes    Family  History  Problem Relation Age of Onset   Prostate cancer Father 13       in his 70's/other comorbidities/79 deceased   Alcohol abuse Father    Liver disease Father    Heart attack Father 52   Breast cancer Mother 27       mets to bone,deceased age 27   Cervical cancer Sister    Cervical cancer Sister    Breast cancer Sister    CAD Brother        Died of sudden cardiac death/MI     Prior to Admission medications   Medication Sig Start Date End Date Taking? Authorizing Provider  acetaminophen (TYLENOL) 500 MG tablet Take 500 mg by mouth as needed for moderate pain.    [provider]  aspirin EC 81 MG tablet Take 1 tablet (81 mg total) by mouth daily. Swallow whole. 01/24/20   [name], [name], MD  atorvastatin (LIPITOR) 80 MG tablet TAKE 1 TABLET ONCE DAILY AT 6 PM. Patient taking differently: Take  80 mg by mouth daily. 07/25/20   [name], Mihai, MD  cholecalciferol (VITAMIN D3) 25 MCG (1000 UNIT) tablet Take 1,000 Units by mouth daily.    [provider]  HYDROmorphone (DILAUDID) 2 MG tablet Take 0.5 tablets (1 mg total) by mouth every 6 (six) hours as needed for severe pain. 01/26/21   Arrien, Jimmy Picket, MD  metoprolol succinate (TOPROL-XL) 50 MG 24 hr tablet Take 1 tablet (50 mg total) by mouth at bedtime. 02/06/20   [name], Mihai, MD  Milk Thistle 1000 MG CAPS Take 1 capsule by mouth daily.    [provider]  mirtazapine (REMERON) 15 MG tablet Take 1 tablet (15 mg total) by mouth at bedtime. 02/03/21   [name], [name], MD  nitroGLYCERIN (NITROSTAT) 0.4 MG SL tablet 1 TAB UNDER TONGUE AS NEEDED FOR CHEST PAIN. MAY REPEAT EVERY 5 MIN FOR A TOTAL OF 3 DOSES. Patient taking differently: Place 0.4 mg under the tongue every 5 (five) minutes as needed for chest pain. 06/21/19   [name], Mihai, MD  oxyCODONE (OXY IR/ROXICODONE) 5 MG immediate release tablet Take 1 tablet (5 mg total) by mouth every 4 (four) hours as needed for breakthrough pain or  moderate pain. 01/26/21   Arrien, Jimmy Picket, MD  polyethylene glycol (MIRALAX / GLYCOLAX) 17 g packet Take 17 g by mouth daily. 01/26/21   Arrien, Jimmy Picket, MD  prochlorperazine (COMPAZINE) 10 MG tablet TAKE 1 TABLET EVERY 6 HOURS AS NEEDED FOR NAUSEA & VOMITING. 01/27/21   Wyatt Portela, MD    Physical Exam: Vitals:   02/15/21 1830 02/15/21 1833 02/15/21 1845 02/15/21 1900  BP: (!) 150/83  (!) 153/81 (!) 158/88  Pulse: 96 100 100 (!) 105  Resp: (!) 30 (!) 21 18 (!) 22  Temp:      TempSrc:      SpO2: 100% 99% 100% 100%  Weight:      Height:       Constitutional: Elderly man resting supine in bed, appears fatigued and NAD, calm, comfortable Eyes: PERRL, lids and conjunctivae normal ENMT: Mucous membranes are dry. Posterior pharynx clear of any exudate or lesions.Normal dentition.  Neck: normal, supple, no masses. Respiratory: clear to auscultation bilaterally, no wheezing, no crackles. Normal respiratory effort. No accessory muscle use.  Cardiovascular: Regular rate and rhythm, systolic murmur present. No extremity edema. 2+ pedal pulses. Abdomen: Mild generalized tenderness, no masses palpated. No hepatosplenomegaly. Bowel sounds positive.  Musculoskeletal: no clubbing / cyanosis. No joint deformity upper and lower extremities. Good ROM, no contractures. Normal muscle tone.  Skin: Small abrasions to the medial right eyebrow and bridge of nose. No induration.  Skin is dry. Neurologic: CN 2-12 grossly intact. Sensation intact. Strength 5/5 in all 4.  Psychiatric: Normal judgment and insight. Alert and oriented x 3. Normal mood.   Labs on Admission: I have personally reviewed following labs and imaging studies  CBC: Recent Labs  Lab 02/11/21 1135 02/12/21 1322 02/15/21 1448  WBC 9.5 7.4 10.4  NEUTROABS 7.4 5.7 8.7*  HGB 9.0* 7.6* 8.4*  HCT 28.7* 24.3* 27.1*  MCV 83.9 80.7 83.4  PLT 296 251 086   Basic Metabolic Panel: Recent Labs  Lab 02/11/21 1135  02/12/21 1322 02/15/21 1448  NA 138 140 137  K 3.8 3.7 3.7  CL 105 108 104  CO2 20* 21* 21*  GLUCOSE 145* 126* 138*  BUN 37* 32* 28*  CREATININE 1.49* 1.28* 1.03  CALCIUM 9.4 9.2 9.2  MG 2.4  --   --  GFR: Estimated Creatinine Clearance: 60.7 mL/min (by C-G formula based on SCr of 1.03 mg/dL). Liver Function Tests: Recent Labs  Lab 02/11/21 1135 02/12/21 1322 02/15/21 1448  AST 30 27 28   ALT 18 15 15   ALKPHOS 135* 131* 109  BILITOT 1.6* 1.1 1.3*  PROT 8.4* 7.3 7.5  ALBUMIN 3.4* 2.8* 3.1*   No results for input(s): LIPASE, AMYLASE in the last 168 hours. No results for input(s): AMMONIA in the last 168 hours. Coagulation Profile: No results for input(s): INR, PROTIME in the last 168 hours. Cardiac Enzymes: No results for input(s): CKTOTAL, CKMB, CKMBINDEX, TROPONINI in the last 168 hours. BNP (last 3 results) No results for input(s): PROBNP in the last 8760 hours. HbA1C: No results for input(s): HGBA1C in the last 72 hours. CBG: No results for input(s): GLUCAP in the last 168 hours. Lipid Profile: No results for input(s): CHOL, HDL, LDLCALC, TRIG, CHOLHDL, LDLDIRECT in the last 72 hours. Thyroid Function Tests: No results for input(s): TSH, T4TOTAL, FREET4, T3FREE, THYROIDAB in the last 72 hours. Anemia Panel: No results for input(s): VITAMINB12, FOLATE, FERRITIN, TIBC, IRON, RETICCTPCT in the last 72 hours. Urine analysis:    Component Value Date/Time   COLORURINE RED (A) 10/09/2019 1900   APPEARANCEUR TURBID (A) 10/09/2019 1900   LABSPEC  10/09/2019 1900    TEST NOT REPORTED DUE TO COLOR INTERFERENCE OF URINE PIGMENT   PHURINE  10/09/2019 1900    TEST NOT REPORTED DUE TO COLOR INTERFERENCE OF URINE PIGMENT   GLUCOSEU (A) 10/09/2019 1900    TEST NOT REPORTED DUE TO COLOR INTERFERENCE OF URINE PIGMENT   HGBUR (A) 10/09/2019 1900    TEST NOT REPORTED DUE TO COLOR INTERFERENCE OF URINE PIGMENT   BILIRUBINUR (A) 10/09/2019 1900    TEST NOT REPORTED DUE TO  COLOR INTERFERENCE OF URINE PIGMENT   KETONESUR (A) 10/09/2019 1900    TEST NOT REPORTED DUE TO COLOR INTERFERENCE OF URINE PIGMENT   PROTEINUR (A) 10/09/2019 1900    TEST NOT REPORTED DUE TO COLOR INTERFERENCE OF URINE PIGMENT   NITRITE (A) 10/09/2019 1900    TEST NOT REPORTED DUE TO COLOR INTERFERENCE OF URINE PIGMENT   LEUKOCYTESUR (A) 10/09/2019 1900    TEST NOT REPORTED DUE TO COLOR INTERFERENCE OF URINE PIGMENT    Radiological Exams on Admission: CT Angio Chest PE W and/or Wo Contrast  Result Date: 02/15/2021 CLINICAL DATA:  75 year old male with acute chest pain and syncope. EXAM: CT ANGIOGRAPHY CHEST WITH CONTRAST TECHNIQUE: Multidetector CT imaging of the chest was performed using the standard protocol during bolus administration of intravenous contrast. Multiplanar CT image reconstructions and MIPs were obtained to evaluate the vascular anatomy. CONTRAST:  55mL OMNIPAQUE IOHEXOL 350 MG/ML SOLN COMPARISON:  09/03/2020 and prior CTs FINDINGS: Cardiovascular: This is a technically adequate study but respiratory motion artifact in the LOWER lungs slightly decreases sensitivity. No pulmonary emboli are identified. CABG aortic valve replacement noted. Thoracic aortic atherosclerotic calcifications are noted without aneurysm. No pericardial effusion. A RIGHT IJ Port-A-Cath is noted with tip at the SUPERIOR cavoatrial junction. Mediastinum/Nodes: No enlarged mediastinal, hilar, or axillary lymph nodes. Thyroid gland, trachea, and esophagus demonstrate no significant findings. Lungs/Pleura: Lungs are clear. No pleural effusion or pneumothorax. Upper Abdomen: No acute abnormality. Musculoskeletal: No acute or suspicious bony abnormalities are noted. Surgical hardware within the proximal RIGHT humerus and median sternotomy wires are present. Review of the MIP images confirms the above findings. IMPRESSION: No evidence of acute abnormality. No evidence of pulmonary emboli. Aortic  Atherosclerosis  (ICD10-I70.0). Electronically Signed   By: Margarette Canada M.D.   On: 02/15/2021 16:58   DG Chest Portable 1 View  Result Date: 02/15/2021 CLINICAL DATA:  Syncopal episode. EXAM: PORTABLE CHEST 1 VIEW COMPARISON:  Chest CT 09/03/2020 FINDINGS: The cardiac silhouette, mediastinal and hilar contours are within normal limits. Stable surgical changes from bypass surgery. Right IJ power port is in good position without complicating features. No acute pulmonary findings. No pulmonary lesions or pleural effusions. The bony thorax is intact. IMPRESSION: No acute cardiopulmonary findings. Electronically Signed   By: Marijo Sanes M.D.   On: 02/15/2021 14:56    EKG: Personally reviewed. Sinus tachycardia, rate 101, PVCs present, LBBB.  Rate is faster when compared to prior.  Assessment/Plan Principal Problem:   Syncope Active Problems:   Essential hypertension   CAD (coronary artery disease), native coronary artery   Malignant tumor of renal pelvis, left (HCC)   Lawrence Hall is a 75 y.o. male with medical history significant for stage IV urothelial carcinoma with hepatic and osseous metastases (s/p radical resection to right humerus) recently started on salvage chemotherapy, [name], moderate AS s/p AVR, postoperative A. fib without recurrence, first-degree AV block, LBBB, anemia of chronic disease, HTN, and HLD who is admitted after syncopal episode.  Syncope: Likely due to hypovolemia/dehydration/orthostasis in setting of nausea/vomiting, decreased oral intake after recently started on chemotherapy. -Start IV fluid hydration overnight -Check orthostatic vitals -Fall precautions, PT/OT eval -Hold antihypertensives for now -Monitor on telemetry  Nausea/vomiting/appetite: Has progressive failure to thrive related to disease progression of his cancer, acutely worsened in setting of chemotherapy.  Will trial scheduled IV Reglan with Zofran as needed.  Continue Remeron.  Advance diet as  tolerated.  Stage IV urothelial carcinoma with hepatic and osseous metastases: Follows with oncology, Dr. Alen Blew.  Started on chemotherapy with Padcev 10/26.  Per Dr. Hazeline Junker recent note, patient is approaching end-stage status and need for hospice.  Would benefit from further palliative discussions.  Anemia chronic disease: Currently stable, no indication for transfusion at this time.  Hypertension: Holding antihypertensives.  [name] Moderate AS s/p AVR Postoperative A. fib without recurrence Hyperlipidemia: Chronic conditions which appear to be stable.  Continue aspirin and statin.  Holding Toprol-XL for now.  DVT prophylaxis: Lovenox Code Status: Full code, would benefit from further palliative care discussions Family Communication: Discussed with patient's spouse at bedside Disposition Plan: From home, dispo pending clinical progress Consults called: None Level of care: Telemetry Admission status:  Status is: Observation  The patient remains OBS appropriate and will d/c before 2 midnights.  Zada Finders MD [facility]  If 7PM-7AM, please contact night-coverage [email or web address]  02/15/2021, 8:09 PM

## 2021-02-16 DIAGNOSIS — E43 Unspecified severe protein-calorie malnutrition: Secondary | ICD-10-CM | POA: Diagnosis present

## 2021-02-16 DIAGNOSIS — R112 Nausea with vomiting, unspecified: Secondary | ICD-10-CM | POA: Diagnosis present

## 2021-02-16 DIAGNOSIS — C652 Malignant neoplasm of left renal pelvis: Secondary | ICD-10-CM

## 2021-02-16 DIAGNOSIS — Z20822 Contact with and (suspected) exposure to covid-19: Secondary | ICD-10-CM | POA: Diagnosis present

## 2021-02-16 DIAGNOSIS — C649 Malignant neoplasm of unspecified kidney, except renal pelvis: Secondary | ICD-10-CM | POA: Diagnosis present

## 2021-02-16 DIAGNOSIS — Z8049 Family history of malignant neoplasm of other genital organs: Secondary | ICD-10-CM | POA: Diagnosis not present

## 2021-02-16 DIAGNOSIS — I2510 Atherosclerotic heart disease of native coronary artery without angina pectoris: Secondary | ICD-10-CM | POA: Diagnosis present

## 2021-02-16 DIAGNOSIS — E860 Dehydration: Secondary | ICD-10-CM | POA: Insufficient documentation

## 2021-02-16 DIAGNOSIS — R55 Syncope and collapse: Secondary | ICD-10-CM | POA: Diagnosis not present

## 2021-02-16 DIAGNOSIS — I10 Essential (primary) hypertension: Secondary | ICD-10-CM

## 2021-02-16 DIAGNOSIS — C679 Malignant neoplasm of bladder, unspecified: Secondary | ICD-10-CM | POA: Diagnosis present

## 2021-02-16 DIAGNOSIS — E785 Hyperlipidemia, unspecified: Secondary | ICD-10-CM | POA: Diagnosis not present

## 2021-02-16 DIAGNOSIS — D630 Anemia in neoplastic disease: Secondary | ICD-10-CM | POA: Diagnosis present

## 2021-02-16 DIAGNOSIS — D649 Anemia, unspecified: Secondary | ICD-10-CM | POA: Diagnosis not present

## 2021-02-16 DIAGNOSIS — R627 Adult failure to thrive: Secondary | ICD-10-CM | POA: Diagnosis present

## 2021-02-16 DIAGNOSIS — E876 Hypokalemia: Secondary | ICD-10-CM | POA: Diagnosis present

## 2021-02-16 DIAGNOSIS — Z6821 Body mass index (BMI) 21.0-21.9, adult: Secondary | ICD-10-CM | POA: Diagnosis not present

## 2021-02-16 DIAGNOSIS — Z515 Encounter for palliative care: Secondary | ICD-10-CM | POA: Diagnosis not present

## 2021-02-16 DIAGNOSIS — Z8042 Family history of malignant neoplasm of prostate: Secondary | ICD-10-CM | POA: Diagnosis not present

## 2021-02-16 DIAGNOSIS — Y92009 Unspecified place in unspecified non-institutional (private) residence as the place of occurrence of the external cause: Secondary | ICD-10-CM | POA: Diagnosis not present

## 2021-02-16 DIAGNOSIS — C787 Secondary malignant neoplasm of liver and intrahepatic bile duct: Secondary | ICD-10-CM | POA: Diagnosis present

## 2021-02-16 DIAGNOSIS — E861 Hypovolemia: Secondary | ICD-10-CM | POA: Diagnosis present

## 2021-02-16 DIAGNOSIS — C7951 Secondary malignant neoplasm of bone: Secondary | ICD-10-CM | POA: Diagnosis present

## 2021-02-16 DIAGNOSIS — Z803 Family history of malignant neoplasm of breast: Secondary | ICD-10-CM | POA: Diagnosis not present

## 2021-02-16 DIAGNOSIS — Z811 Family history of alcohol abuse and dependence: Secondary | ICD-10-CM | POA: Diagnosis not present

## 2021-02-16 DIAGNOSIS — Z79899 Other long term (current) drug therapy: Secondary | ICD-10-CM | POA: Diagnosis not present

## 2021-02-16 DIAGNOSIS — K5900 Constipation, unspecified: Secondary | ICD-10-CM | POA: Diagnosis present

## 2021-02-16 DIAGNOSIS — Z7982 Long term (current) use of aspirin: Secondary | ICD-10-CM | POA: Diagnosis not present

## 2021-02-16 DIAGNOSIS — I951 Orthostatic hypotension: Secondary | ICD-10-CM | POA: Diagnosis present

## 2021-02-16 LAB — CBC
HCT: 23.3 % — ABNORMAL LOW (ref 39.0–52.0)
Hemoglobin: 7.3 g/dL — ABNORMAL LOW (ref 13.0–17.0)
MCH: 26.2 pg (ref 26.0–34.0)
MCHC: 31.3 g/dL (ref 30.0–36.0)
MCV: 83.5 fL (ref 80.0–100.0)
Platelets: 217 10*3/uL (ref 150–400)
RBC: 2.79 MIL/uL — ABNORMAL LOW (ref 4.22–5.81)
RDW: 17.4 % — ABNORMAL HIGH (ref 11.5–15.5)
WBC: 12.1 10*3/uL — ABNORMAL HIGH (ref 4.0–10.5)
nRBC: 0 % (ref 0.0–0.2)

## 2021-02-16 LAB — URINALYSIS, ROUTINE W REFLEX MICROSCOPIC
Bilirubin Urine: NEGATIVE
Glucose, UA: NEGATIVE mg/dL
Hgb urine dipstick: NEGATIVE
Ketones, ur: NEGATIVE mg/dL
Leukocytes,Ua: NEGATIVE
Nitrite: NEGATIVE
Protein, ur: NEGATIVE mg/dL
Specific Gravity, Urine: 1.029 (ref 1.005–1.030)
pH: 5 (ref 5.0–8.0)

## 2021-02-16 LAB — BASIC METABOLIC PANEL
Anion gap: 10 (ref 5–15)
BUN: 23 mg/dL (ref 8–23)
CO2: 21 mmol/L — ABNORMAL LOW (ref 22–32)
Calcium: 8.6 mg/dL — ABNORMAL LOW (ref 8.9–10.3)
Chloride: 107 mmol/L (ref 98–111)
Creatinine, Ser: 1.11 mg/dL (ref 0.61–1.24)
GFR, Estimated: >60 mL/min (ref >60)
Glucose, Bld: 118 mg/dL — ABNORMAL HIGH (ref 70–99)
Potassium: 3.8 mmol/L (ref 3.5–5.1)
Sodium: 138 mmol/L (ref 135–145)

## 2021-02-16 LAB — HEMOGLOBIN AND HEMATOCRIT, BLOOD
HCT: 22.6 % — ABNORMAL LOW (ref 39.0–52.0)
Hemoglobin: 7.1 g/dL — ABNORMAL LOW (ref 13.0–17.0)

## 2021-02-16 LAB — CBG MONITORING, ED: Glucose-Capillary: 114 mg/dL — ABNORMAL HIGH (ref 70–99)

## 2021-02-16 LAB — PREPARE RBC (CROSSMATCH)

## 2021-02-16 MED ORDER — FUROSEMIDE 10 MG/ML IJ SOLN
20.0000 mg | Freq: Once | INTRAMUSCULAR | Status: AC
  Administered 2021-02-16: 20 mg via INTRAVENOUS
  Filled 2021-02-16: qty 2

## 2021-02-16 MED ORDER — METOPROLOL SUCCINATE ER 50 MG PO TB24: 50.0000 mg | ORAL_TABLET | Freq: Every day | ORAL | Status: DC

## 2021-02-16 MED ORDER — ACETAMINOPHEN 325 MG PO TABS
650.0000 mg | ORAL_TABLET | Freq: Once | ORAL | Status: AC
  Administered 2021-02-16: 650 mg via ORAL
  Filled 2021-02-16: qty 2

## 2021-02-16 MED ORDER — METOPROLOL TARTRATE 5 MG/5ML IV SOLN
2.5000 mg | Freq: Once | INTRAVENOUS | Status: AC
  Administered 2021-02-16: 2.5 mg via INTRAVENOUS
  Filled 2021-02-16: qty 5

## 2021-02-16 MED ORDER — LACTATED RINGERS IV SOLN: INTRAVENOUS | Status: AC

## 2021-02-16 MED ORDER — HYDROMORPHONE HCL 2 MG PO TABS: 1.0000 mg | ORAL_TABLET | Freq: Four times a day (QID) | ORAL | Status: DC | PRN

## 2021-02-16 MED ORDER — CHLORHEXIDINE GLUCONATE CLOTH 2 % EX PADS
6.0000 | MEDICATED_PAD | Freq: Every day | CUTANEOUS | Status: DC
  Administered 2021-02-17 – 2021-02-22 (×6): 6 via TOPICAL

## 2021-02-16 MED ORDER — SODIUM CHLORIDE 0.9% IV SOLUTION: Freq: Once | INTRAVENOUS | Status: AC

## 2021-02-16 MED ORDER — OXYCODONE HCL 5 MG PO TABS
5.0000 mg | ORAL_TABLET | ORAL | Status: DC | PRN
Start: 2021-02-16 — End: 2021-02-22
  Administered 2021-02-22 (×2): 5 mg via ORAL
  Filled 2021-02-16 (×2): qty 1

## 2021-02-16 MED ORDER — POLYETHYLENE GLYCOL 3350 17 G PO PACK
17.0000 g | PACK | Freq: Two times a day (BID) | ORAL | Status: DC
  Filled 2021-02-16: qty 1

## 2021-02-16 MED ORDER — BISACODYL 10 MG RE SUPP: 10.0000 mg | Freq: Once | RECTAL | Status: DC

## 2021-02-16 MED ORDER — METOPROLOL SUCCINATE ER 50 MG PO TB24
50.0000 mg | ORAL_TABLET | Freq: Every day | ORAL | Status: DC
  Administered 2021-02-16 – 2021-02-21 (×6): 50 mg via ORAL
  Filled 2021-02-16 (×6): qty 1

## 2021-02-16 MED ORDER — DIPHENHYDRAMINE HCL 25 MG PO CAPS
25.0000 mg | ORAL_CAPSULE | Freq: Once | ORAL | Status: AC
  Administered 2021-02-16: 25 mg via ORAL
  Filled 2021-02-16: qty 1

## 2021-02-16 NOTE — ED Notes (Signed)
Patient aware urine sample is needed.  

## 2021-02-16 NOTE — Progress Notes (Signed)
OT Cancellation Note  Patient Details Name: Lawrence Hall MRN: [id number] DOB: 1945/07/26   Cancelled Treatment:    Reason Eval/Treat Not Completed: Medical issues which prohibited therapy patient is currently in RED MEWS. Will check back when patient is more medically stable. OT to continue to follow.   Jackelyn Poling OTR/L, [facility] Office# [phone] Pager# [phone]    02/16/2021, 9:48 AM

## 2021-02-16 NOTE — Progress Notes (Signed)
PROGRESS NOTE    Lawrence Hall  RKY:[id number] DOB: 1945/12/18 DOA: 02/15/2021 PCP: [name], MD    Chief Complaint  Patient presents with   Loss of Consciousness   Emesis    Brief Narrative: Patient 75 year old gentleman history of stage IV urothelial carcinoma with hepatic and osseous mets status post radical resection to right humerus recently started on salvage chemotherapy, history of CAD status post CABG, moderate AAS status post AVR, postop A. fib without recurrence, first-degree AV block, LBBB, anemia of chronic disease, hypertension, hyperlipidemia presenting to the ED after syncopal episode around 4 AM on the day of admission.  Patient noted to have started recent palliative chemotherapy 02/12/2021 received hydration pre and posttreatment did well on the first day however since then has had fatigue, generalized weakness, nausea vomiting, inability to maintain any adequate oral intake with lightheadedness resulting in a syncopal episode.  On presentation to the ED patient noted to have a blood pressure 100/70, pulse of 118, sats of 99% on room air.  Cysts COVID-19 PCR negative.  Chest x-ray negative for any acute infiltrates.  CTA chest negative for PE.  Patient placed on IV Reglan, IV fluids, supportive care.   Assessment & Plan:   Principal Problem:   Syncope Active Problems:   Essential hypertension   CAD (coronary artery disease), native coronary artery   Malignant tumor of renal pelvis, left (HCC)   Anemia   Nausea and vomiting   #1 syncope -Likely secondary to hypovolemia secondary to dehydration/orthostasis in the setting of ongoing nausea vomiting decreased oral intake after recent chemotherapy. -Patient on IV fluids with some clinical improvement. -Patient tolerating clear liquids. -Patient noted a regular diet however at this time would like his diet downgraded which we will do and decrease to a full liquid diet at this time. -Continue IV Reglan, IV  fluids, IV antiemetics, supportive care.  2.  Dehydration -IV fluids.  3.  Anemia of chronic disease -Likely secondary to recent chemotherapy. -Patient with no overt bleeding. -Follow H&H. -Transfusion threshold hemoglobin < 7.  4.  Nausea/vomiting/decreased appetite -Patient with progressive failure to thrive related to disease progression of cancer, acutely worsened in the setting of chemotherapy. -Patient started on IV Reglan with Zofran as needed with some clinical improvement. -Patient tolerating clear liquids. -Patient noted to be on a regular diet which we will downgrade down to full liquid diet per patient request. -IV fluids, supportive care.  5.  Stage IV urothelial carcinoma with hepatic and osseous mets -Patient being followed by oncologist Dr. Alen Blew who has been notified via epic of patient's admission. -Patient noted to have been started on Padcev 10/26. -Per oncology's most recent note patient likely approaching end-stage status and may need hospice. -Defer further management to oncology.  6.  Hypertension -We will resume patient's Toprol-XL this evening as blood pressure improving.  7.  Coronary artery disease status post CABG/moderate AS status post AVR/postop A. fib without recurrence/hyperlipidemia -Stable. -Continue aspirin, statin. -Resume Toprol-XL this evening.   DVT prophylaxis: Lovenox Code Status: Full Family Communication: Updated patient and wife at bedside. Disposition:   Status is: Observation  The patient will require care spanning > 2 midnights and should be moved to inpatient because: Requirement of IV antibiotics, nausea vomiting inability to tolerate anything on admission, inpatient treatment necessary at this time.      Consultants:  Oncology informed via epic of admission.  Procedures:  CT angiogram chest 02/15/2021 Chest x-ray 02/15/2021    Antimicrobials:  None  Subjective: Patient laying on gurney.  Complaining of  pain in his back felt likely secondary to being on gurney for a while.  No chest pain.  No shortness of breath.  Was able to tolerate some liquids this morning.  No further nausea or emesis today.  Per wife patient constipated.  Overall feeling a little bit better than on presentation.  Objective: Vitals:   02/16/21 1000 02/16/21 1130 02/16/21 1200 02/16/21 1400  BP: 120/74 137/82 127/73 138/74  Pulse: (!) 103 (!) 109 86 100  Resp: 16 16 (!) 24 19  Temp:      TempSrc:      SpO2: 98% 100% 98% 99%  Weight:      Height:        Intake/Output Summary (Last 24 hours) at 02/16/2021 1513 Last data filed at 02/15/2021 2028 Gross per 24 hour  Intake 1000 ml  Output --  Net 1000 ml   Filed Weights   02/15/21 1341  Weight: 71.5 kg    Examination:  General exam: Appears calm and comfortable.  Pallor.  Dry mucous membranes. Respiratory system: Clear to auscultation.  No wheezes, no crackles, no rhonchi.  Respiratory effort normal. Cardiovascular system: S1 & S2 heard, RRR. No JVD, murmurs, rubs, gallops or clicks. No pedal edema. Gastrointestinal system: Abdomen is nondistended, soft and nontender. No organomegaly or masses felt. Normal bowel sounds heard. Central nervous system: Alert and oriented. No focal neurological deficits. Extremities: Symmetric 5 x 5 power. Skin: No rashes, lesions or ulcers Psychiatry: Judgement and insight appear normal. Mood & affect appropriate.     Data Reviewed: I have personally reviewed following labs and imaging studies  CBC: Recent Labs  Lab 02/11/21 1135 02/12/21 1322 02/15/21 1448 02/16/21 0338  WBC 9.5 7.4 10.4 12.1*  NEUTROABS 7.4 5.7 8.7*  --   HGB 9.0* 7.6* 8.4* 7.3*  HCT 28.7* 24.3* 27.1* 23.3*  MCV 83.9 80.7 83.4 83.5  PLT 296 251 264 119    Basic Metabolic Panel: Recent Labs  Lab 02/11/21 1135 02/12/21 1322 02/15/21 1448 02/16/21 0338  NA 138 140 137 138  K 3.8 3.7 3.7 3.8  CL 105 108 104 107  CO2 20* 21* 21* 21*   GLUCOSE 145* 126* 138* 118*  BUN 37* 32* 28* 23  CREATININE 1.49* 1.28* 1.03 1.11  CALCIUM 9.4 9.2 9.2 8.6*  MG 2.4  --   --   --     GFR: Estimated Creatinine Clearance: 56.4 mL/min (by C-G formula based on SCr of 1.11 mg/dL).  Liver Function Tests: Recent Labs  Lab 02/11/21 1135 02/12/21 1322 02/15/21 1448  AST 30 27 28   ALT 18 15 15   ALKPHOS 135* 131* 109  BILITOT 1.6* 1.1 1.3*  PROT 8.4* 7.3 7.5  ALBUMIN 3.4* 2.8* 3.1*    CBG: Recent Labs  Lab 02/16/21 0513  GLUCAP 114*     Recent Results (from the past 240 hour(s))  Blood culture (routine x 2)     Status: None (Preliminary result)   Collection Time: 02/15/21  2:14 PM   Specimen: BLOOD RIGHT HAND  Result Value Ref Range Status   Specimen Description   Final    BLOOD RIGHT HAND Performed at [facility], [place] [address] [place], [place] [address]    Special Requests   Final    BOTTLES DRAWN AEROBIC AND ANAEROBIC Blood Culture results may not be optimal due to an inadequate volume of blood received in culture bottles Performed at [facility], [place] [address], [place], [place] [address]    Culture   Final    NO GROWTH < 24 HOURS Performed at [facility] [address] [place], [place] [address]    Report Status PENDING  Incomplete  Resp Panel by RT-PCR (Flu A&B, Covid) Nasopharyngeal Swab     Status: None   Collection Time: 02/15/21  2:48 PM   Specimen: Nasopharyngeal Swab; Nasopharyngeal(NP) swabs in vial transport medium  Result Value Ref Range Status   SARS Coronavirus 2 by RT PCR NEGATIVE NEGATIVE Final    Comment: (NOTE) SARS-CoV-2 target nucleic acids are NOT DETECTED.  The SARS-CoV-2 RNA is generally detectable in upper respiratory specimens during the acute phase of infection. The lowest concentration of SARS-CoV-2 viral copies this assay can detect is 138 copies/mL. A negative result does not preclude SARS-Cov-2 infection and should not be used as  the sole basis for treatment or other patient management decisions. A negative result may occur with  improper specimen collection/handling, submission of specimen other than nasopharyngeal swab, presence of viral mutation(s) within the areas targeted by this assay, and inadequate number of viral copies(<138 copies/mL). A negative result must be combined with clinical observations, patient history, and epidemiological information. The expected result is Negative.  Fact Sheet for Patients:  EntrepreneurPulse.com.au  Fact Sheet for Healthcare Providers:  IncredibleEmployment.be  This test is no t yet approved or cleared by the [place] FDA and  has been authorized for detection and/or diagnosis of SARS-CoV-2 by FDA under an Emergency Use Authorization (EUA). This EUA will remain  in effect (meaning this test can be used) for the duration of the COVID-19 declaration under Section 564(b)(1) of the Act, 21 U.S.C.section 360bbb-3(b)(1), unless the authorization is terminated  or revoked sooner.       Influenza A by PCR NEGATIVE NEGATIVE Final   Influenza B by PCR NEGATIVE NEGATIVE Final    Comment: (NOTE) The Xpert Xpress SARS-CoV-2/FLU/RSV plus assay is intended as an aid in the diagnosis of influenza from Nasopharyngeal swab specimens and should not be used as a sole basis for treatment. Nasal washings and aspirates are unacceptable for Xpert Xpress SARS-CoV-2/FLU/RSV testing.  Fact Sheet for Patients: EntrepreneurPulse.com.au  Fact Sheet for Healthcare Providers: IncredibleEmployment.be  This test is not yet approved or cleared by the [place] FDA and has been authorized for detection and/or diagnosis of SARS-CoV-2 by FDA under an Emergency Use Authorization (EUA). This EUA will remain in effect (meaning this test can be used) for the duration of the COVID-19 declaration under Section 564(b)(1) of  the Act, 21 U.S.C. section 360bbb-3(b)(1), unless the authorization is terminated or revoked.  Performed at [facility], [place] [address], [place], [place] [address]   Blood culture (routine x 2)     Status: None (Preliminary result)   Collection Time: 02/15/21  2:49 PM   Specimen: BLOOD  Result Value Ref Range Status   Specimen Description   Final    BLOOD PORTA CATH Performed at [facility] [address], [place], [place] [address]    Special Requests   Final    BOTTLES DRAWN AEROBIC AND ANAEROBIC Blood Culture adequate volume Performed at [facility] [address] [place], [place] [address]    Culture   Final    NO GROWTH < 24 HOURS Performed at [facility] [address] [place],  [address]    Report Status PENDING  Incomplete  Radiology Studies: CT Angio Chest PE W and/or Wo Contrast  Result Date: 02/15/2021 CLINICAL DATA:  75 year old male with acute chest pain and syncope. EXAM: CT ANGIOGRAPHY CHEST WITH CONTRAST TECHNIQUE: Multidetector CT imaging of the chest was performed using the standard protocol during bolus administration of intravenous contrast. Multiplanar CT image reconstructions and MIPs were obtained to evaluate the vascular anatomy. CONTRAST:  87mL OMNIPAQUE IOHEXOL 350 MG/ML SOLN COMPARISON:  09/03/2020 and prior CTs FINDINGS: Cardiovascular: This is a technically adequate study but respiratory motion artifact in the LOWER lungs slightly decreases sensitivity. No pulmonary emboli are identified. CABG aortic valve replacement noted. Thoracic aortic atherosclerotic calcifications are noted without aneurysm. No pericardial effusion. A RIGHT IJ Port-A-Cath is noted with tip at the SUPERIOR cavoatrial junction. Mediastinum/Nodes: No enlarged mediastinal, hilar, or axillary lymph nodes. Thyroid gland, trachea, and esophagus demonstrate no significant findings. Lungs/Pleura: Lungs  are clear. No pleural effusion or pneumothorax. Upper Abdomen: No acute abnormality. Musculoskeletal: No acute or suspicious bony abnormalities are noted. Surgical hardware within the proximal RIGHT humerus and median sternotomy wires are present. Review of the MIP images confirms the above findings. IMPRESSION: No evidence of acute abnormality. No evidence of pulmonary emboli. Aortic Atherosclerosis (ICD10-I70.0). Electronically Signed   By: Margarette Canada M.D.   On: 02/15/2021 16:58   DG Chest Portable 1 View  Result Date: 02/15/2021 CLINICAL DATA:  Syncopal episode. EXAM: PORTABLE CHEST 1 VIEW COMPARISON:  Chest CT 09/03/2020 FINDINGS: The cardiac silhouette, mediastinal and hilar contours are within normal limits. Stable surgical changes from bypass surgery. Right IJ power port is in good position without complicating features. No acute pulmonary findings. No pulmonary lesions or pleural effusions. The bony thorax is intact. IMPRESSION: No acute cardiopulmonary findings. Electronically Signed   By: Marijo Sanes M.D.   On: 02/15/2021 14:56        Scheduled Meds:  aspirin EC  81 mg Oral Daily   atorvastatin  80 mg Oral q1800   bisacodyl  10 mg Rectal Once   enoxaparin (LOVENOX) injection  40 mg Subcutaneous Q24H   melatonin  3 mg Oral QHS   metoCLOPramide (REGLAN) injection  10 mg Intravenous Q8H   metoprolol succinate  50 mg Oral QHS   mirtazapine  15 mg Oral QHS   sodium chloride flush  3 mL Intravenous Q12H   Continuous Infusions:  lactated ringers 100 mL/hr at 02/16/21 1131     LOS: 0 days    Time spent: 40 minutes    Irine Seal, MD [facility]   To contact the attending provider between 7A-7P or the covering provider during after hours 7P-7A, please log into the web site [email or web address] and access using universal [facility] password for that web site. If you do not have the password, please call the hospital operator.  02/16/2021, 3:13 PM

## 2021-02-17 DIAGNOSIS — R627 Adult failure to thrive: Secondary | ICD-10-CM | POA: Diagnosis not present

## 2021-02-17 DIAGNOSIS — D649 Anemia, unspecified: Secondary | ICD-10-CM | POA: Diagnosis not present

## 2021-02-17 DIAGNOSIS — E860 Dehydration: Principal | ICD-10-CM

## 2021-02-17 DIAGNOSIS — E785 Hyperlipidemia, unspecified: Secondary | ICD-10-CM

## 2021-02-17 DIAGNOSIS — E876 Hypokalemia: Secondary | ICD-10-CM

## 2021-02-17 DIAGNOSIS — E43 Unspecified severe protein-calorie malnutrition: Secondary | ICD-10-CM

## 2021-02-17 DIAGNOSIS — R55 Syncope and collapse: Secondary | ICD-10-CM

## 2021-02-17 DIAGNOSIS — I10 Essential (primary) hypertension: Secondary | ICD-10-CM

## 2021-02-17 DIAGNOSIS — C679 Malignant neoplasm of bladder, unspecified: Secondary | ICD-10-CM

## 2021-02-17 DIAGNOSIS — R531 Weakness: Secondary | ICD-10-CM

## 2021-02-17 LAB — BASIC METABOLIC PANEL
Anion gap: 8 (ref 5–15)
BUN: 19 mg/dL (ref 8–23)
CO2: 23 mmol/L (ref 22–32)
Calcium: 8.4 mg/dL — ABNORMAL LOW (ref 8.9–10.3)
Chloride: 104 mmol/L (ref 98–111)
Creatinine, Ser: 1.14 mg/dL (ref 0.61–1.24)
GFR, Estimated: >60 mL/min (ref >60)
Glucose, Bld: 105 mg/dL — ABNORMAL HIGH (ref 70–99)
Potassium: 3.2 mmol/L — ABNORMAL LOW (ref 3.5–5.1)
Sodium: 135 mmol/L (ref 135–145)

## 2021-02-17 LAB — URINE CULTURE: Culture: NO GROWTH

## 2021-02-17 LAB — CBC WITH DIFFERENTIAL/PLATELET
Abs Immature Granulocytes: 0.07 10*3/uL (ref 0.00–0.07)
Basophils Absolute: 0 10*3/uL (ref 0.0–0.1)
Basophils Relative: 0 %
Eosinophils Absolute: 0.2 10*3/uL (ref 0.0–0.5)
Eosinophils Relative: 1 %
HCT: 27.2 % — ABNORMAL LOW (ref 39.0–52.0)
Hemoglobin: 8.8 g/dL — ABNORMAL LOW (ref 13.0–17.0)
Immature Granulocytes: 1 %
Lymphocytes Relative: 8 %
Lymphs Abs: 0.9 10*3/uL (ref 0.7–4.0)
MCH: 27.2 pg (ref 26.0–34.0)
MCHC: 32.4 g/dL (ref 30.0–36.0)
MCV: 84 fL (ref 80.0–100.0)
Monocytes Absolute: 0.9 10*3/uL (ref 0.1–1.0)
Monocytes Relative: 8 %
Neutro Abs: 8.6 10*3/uL — ABNORMAL HIGH (ref 1.7–7.7)
Neutrophils Relative %: 82 %
Platelets: 206 10*3/uL (ref 150–400)
RBC: 3.24 MIL/uL — ABNORMAL LOW (ref 4.22–5.81)
RDW: 17 % — ABNORMAL HIGH (ref 11.5–15.5)
WBC: 10.7 10*3/uL — ABNORMAL HIGH (ref 4.0–10.5)
nRBC: 0 % (ref 0.0–0.2)

## 2021-02-17 LAB — GLUCOSE, CAPILLARY: Glucose-Capillary: 95 mg/dL (ref 70–99)

## 2021-02-17 LAB — MAGNESIUM: Magnesium: 1.9 mg/dL (ref 1.7–2.4)

## 2021-02-17 MED ORDER — ENSURE ENLIVE PO LIQD
237.0000 mL | Freq: Two times a day (BID) | ORAL | Status: DC
  Administered 2021-02-17 (×2): 237 mL via ORAL

## 2021-02-17 MED ORDER — POTASSIUM CHLORIDE CRYS ER 20 MEQ PO TBCR
40.0000 meq | EXTENDED_RELEASE_TABLET | Freq: Once | ORAL | Status: AC
  Administered 2021-02-17: 40 meq via ORAL
  Filled 2021-02-17: qty 2

## 2021-02-17 MED ORDER — ADULT MULTIVITAMIN W/MINERALS CH
1.0000 | ORAL_TABLET | Freq: Every day | ORAL | Status: DC
  Administered 2021-02-22: 1 via ORAL
  Filled 2021-02-17 (×5): qty 1

## 2021-02-17 MED ORDER — MAGIC MOUTHWASH W/LIDOCAINE
15.0000 mL | Freq: Four times a day (QID) | ORAL | Status: DC
  Administered 2021-02-18 – 2021-02-19 (×4): 15 mL via ORAL
  Filled 2021-02-17 (×22): qty 15

## 2021-02-17 MED ORDER — SODIUM CHLORIDE 0.9% FLUSH: 10.0000 mL | INTRAVENOUS | Status: DC | PRN

## 2021-02-17 MED ORDER — ENSURE ENLIVE PO LIQD
237.0000 mL | Freq: Three times a day (TID) | ORAL | Status: DC
  Administered 2021-02-17 – 2021-02-22 (×9): 237 mL via ORAL

## 2021-02-17 NOTE — Evaluation (Signed)
Physical Therapy Evaluation Patient Details Name: ARNIE CLINGENPEEL MRN: [id number] DOB: 1945-09-03 Today's Date: 02/17/2021  History of Present Illness  75 year old male who presented to the hospital after a fall at home. Patient was admitted with syncope, FTT with anemia and dehydration. XTA:VWPVX IV urothelial carcinoma with hepatic and osseous mets, [name], a fib, and hyperlipidemia.  Clinical Impression  On eval, pt required Min A for mobility. He walked ~10 feet around the room with support of IV pole. Pt presents with general weakness, decreased activity tolerance, and impaired gait and balance. He did c/o dizziness towards end of activity. He is motivated to go to rehab with hopes of regaining his strength and prior level of function.   Will continue to follow and progress activity as tolerated.      Recommendations for follow up therapy are one component of a multi-disciplinary discharge planning process, led by the attending physician.  Recommendations may be updated based on patient status, additional functional criteria and insurance authorization.  Follow Up Recommendations Skilled nursing-short term rehab (<3 hours/day)    Assistance Recommended at Discharge Frequent or constant Supervision/Assistance  Functional Status Assessment Patient has had a recent decline in their functional status and demonstrates the ability to make significant improvements in function in a reasonable and predictable amount of time.  Equipment Recommendations   (TBD at next venue)    Recommendations for Other Services       Precautions / Restrictions Precautions Precautions: Fall Precaution Comments: orthostatic Restrictions Weight Bearing Restrictions: No      Mobility  Bed Mobility Overal bed mobility: Needs Assistance Bed Mobility: Supine to Sit;Rolling;Sit to Supine Rolling: Modified independent (Device/Increase time)   Supine to sit: Modified independent (Device/Increase time) Sit  to supine: Min assist   General bed mobility comments: Small amount of assist for LEs back onto bed. Increased time. Sat EOB with cues to tap toes for a minute or two. Pt denied dizziness.    Transfers Overall transfer level: Needs assistance Equipment used: None Transfers: Sit to/from [facility] Sit to Stand: Min guard Stand pivot transfers: Min assist         General transfer comment: Close Min guard. Increased time. Pt deneid dizziness so proceeded with side steps along beside then pivot over to recliner.    Ambulation/Gait Ambulation/Gait assistance: Min Web designer (Feet): 10 Feet Assistive device: IV Pole Gait Pattern/deviations: Step-through pattern;Decreased stride length     General Gait Details: Min A to steady pt. Pt required 2 hands on IV pole for support. Observed LE weakness. Pt c/o weakness, fatigue, and some dizziness after short walk.  Stairs            Wheelchair Mobility    Modified Rankin (Stroke Patients Only)       Balance Overall balance assessment: History of Falls;Needs assistance         Standing balance support: Bilateral upper extremity supported;During functional activity Standing balance-Leahy Scale: Poor                               Pertinent Vitals/Pain Pain Assessment: No/denies pain    Home Living Family/patient expects to be discharged to:: Skilled nursing facility Living Arrangements: Spouse/significant other Available Help at Discharge: Family;Available 24 hours/day Type of Home: House Home Access: Stairs to enter Entrance Stairs-Rails: Can reach both Entrance Stairs-Number of Steps: 10 front entrance   Home Layout: One level Home  Equipment: None Additional Comments: wife was not present in room. patient was noted to have some confusion during session.    Prior Function Prior Level of Function : Independent/Modified Independent                     Hand Dominance    Dominant Hand: Right    Extremity/Trunk Assessment   Upper Extremity Assessment Upper Extremity Assessment: Defer to OT evaluation    Lower Extremity Assessment Lower Extremity Assessment: Generalized weakness    Cervical / Trunk Assessment Cervical / Trunk Assessment: Kyphotic  Communication   Communication: No difficulties  Cognition Arousal/Alertness: Awake/alert Behavior During Therapy: Flat affect Overall Cognitive Status: Within Functional Limits for tasks assessed                                 General Comments: patient was noted to be fatigued during session with increased time to answer questions.        General Comments      Exercises     Assessment/Plan    PT Assessment Patient needs continued PT services  PT Problem List Decreased strength;Decreased mobility;Decreased activity tolerance;Decreased balance;Decreased knowledge of use of DME       PT Treatment Interventions DME instruction;Gait training;Therapeutic activities;Therapeutic exercise;Patient/family education;Balance training;Functional mobility training    PT Goals (Current goals can be found in the Care Plan section)  Acute Rehab PT Goals Patient Stated Goal: to fight (not ready to give up) PT Goal Formulation: With patient Time For Goal Achievement: 03/03/21 Potential to Achieve Goals: Good    Frequency Min 3X/week   Barriers to discharge        Co-evaluation               AM-PAC PT "6 Clicks" Mobility  Outcome Measure Help needed turning from your back to your side while in a flat bed without using bedrails?: None Help needed moving from lying on your back to sitting on the side of a flat bed without using bedrails?: None Help needed moving to and from a bed to a chair (including a wheelchair)?: A Little Help needed standing up from a chair using your arms (e.g., wheelchair or bedside chair)?: A Little Help needed to walk in hospital room?: A Little Help  needed climbing 3-5 steps with a railing? : A Lot 6 Click Score: 19    End of Session Equipment Utilized During Treatment: Gait belt Activity Tolerance: Patient limited by fatigue Patient left: in bed;with call bell/phone within reach;with bed alarm set   PT Visit Diagnosis: Muscle weakness (generalized) (M62.81);Difficulty in walking, not elsewhere classified (R26.2)    Time: 7048-8891 PT Time Calculation (min) (ACUTE ONLY): 21 min   Charges:   PT Evaluation $PT Eval Moderate Complexity: 1 Mod             Doreatha Massed, PT Acute Rehabilitation  Office: [phone] Pager: [phone]

## 2021-02-17 NOTE — Progress Notes (Signed)
PROGRESS NOTE    Lawrence Hall  HKV:[id number] DOB: 08-04-1945 DOA: 02/15/2021 PCP: [name], MD    Chief Complaint  Patient presents with   Loss of Consciousness   Emesis    Brief Narrative: Patient 75 year old gentleman history of stage IV urothelial carcinoma with hepatic and osseous mets status post radical resection to right humerus recently started on salvage chemotherapy, history of CAD status post CABG, moderate AAS status post AVR, postop A. fib without recurrence, first-degree AV block, LBBB, anemia of chronic disease, hypertension, hyperlipidemia presenting to the ED after syncopal episode around 4 AM on the day of admission.  Patient noted to have started recent palliative chemotherapy 02/12/2021 received hydration pre and posttreatment did well on the first day however since then has had fatigue, generalized weakness, nausea vomiting, inability to maintain any adequate oral intake with lightheadedness resulting in a syncopal episode.  On presentation to the ED patient noted to have a blood pressure 100/70, pulse of 118, sats of 99% on room air.  Cysts COVID-19 PCR negative.  Chest x-ray negative for any acute infiltrates.  CTA chest negative for PE.  Patient placed on IV Reglan, IV fluids, supportive care.   Assessment & Plan:   Principal Problem:   Syncope Active Problems:   Essential hypertension   CAD (coronary artery disease), native coronary artery   Malignant tumor of renal pelvis, left (HCC)   Anemia   Nausea and vomiting   Protein-calorie malnutrition, severe   Hypokalemia   1 syncope -Likely secondary to hypovolemia secondary to dehydration/orthostasis in the setting of ongoing nausea vomiting decreased oral intake after recent chemotherapy. -Patient on IV fluids with some clinical improvement. -Patient tolerating clear liquids and asking for diet to be advanced to a soft diet.. -We will place on a soft diet.   -Continue scheduled IV Reglan, IV  fluids, IV antiemetics, supportive care.    2.  Dehydration -IV fluids.  3.  Anemia of chronic disease -Likely secondary to recent chemotherapy. -Patient with no overt bleeding. -Hemoglobin noted at 7.1 yesterday afternoon and patient transfused 2 units packed red blood cells. -Posttransfusion hemoglobin at 8.8. -Follow H&H. -Transfusion threshold hemoglobin < 7.  4.  Nausea/vomiting/decreased appetite -Patient with progressive failure to thrive related to disease progression of cancer, acutely worsened in the setting of chemotherapy. -Patient started on scheduled IV Reglan with Zofran as needed with some clinical improvement. -Patient tolerating clear liquids and asking for diet to be advanced to a soft diet. -Continue IV fluids, antiemetics.  Supportive care..  5.  Stage IV urothelial carcinoma with hepatic and osseous mets -Patient being followed by oncologist Dr. Alen Blew who has been notified via epic of patient's admission. -Patient noted to have been started on Padcev 10/26. -Per oncology's most recent note patient likely approaching end-stage status and may need hospice. -Defer further management to oncology.  6.  Hypertension -Toprol-XL resumed.  Follow .  7.  Coronary artery disease status post CABG/moderate AS status post AVR/postop A. fib without recurrence/hyperlipidemia -Stable. -Continue statin, aspirin, Toprol-XL.  8.  Hypokalemia -Likely secondary to GI losses. -Replete.  9.  Severe protein calorie malnutrition -Nutritional supplementation.   DVT prophylaxis: Lovenox Code Status: Full Family Communication: Updated patient and wife at bedside. Disposition:   Status: Inpatient  The patient will require care spanning > 2 midnights and should be moved to inpatient because: Requirement of IV antibiotics, nausea vomiting inability to tolerate anything on admission, inpatient treatment necessary at this time.  Unsafe discharge  likely will require SNF  placement      Consultants:  Oncology: Dr. Alen Blew 02/17/2021  Procedures:  CT angiogram chest 02/15/2021 Chest x-ray 02/15/2021 Transfusion 2 units packed red blood cells.    Antimicrobials:  None   Subjective: Patient laying in bed.  States he is feeling some better today.  Tolerating full liquid diet.  Denies any significant nausea vomiting.  Overall feeling better but significantly weak.  Wife at bedside.  Objective: Vitals:   02/17/21 0937 02/17/21 1348 02/17/21 1500 02/17/21 1742  BP: (S) 135/72 123/66 (!) 129/58 131/80  Pulse:  99  (!) 102  Resp: (S) (!) 23 20  20   Temp:  99.4 F (37.4 C)  99.8 F (37.7 C)  TempSrc:  Oral  Oral  SpO2:  96%  96%  Weight:      Height:        Intake/Output Summary (Last 24 hours) at 02/17/2021 1805 Last data filed at 02/17/2021 0558 Gross per 24 hour  Intake 1761.67 ml  Output --  Net 1761.67 ml   Filed Weights   02/15/21 1341  Weight: 71.5 kg    Examination:  General exam: Dry mucous membranes. Respiratory system: Lungs clear to auscultation bilaterally.  No wheezes, no crackles, no rhonchi.  Normal respiratory effort.  Speaking in full sentences. Cardiovascular system: Regular rate rhythm no murmurs rubs or gallops.  No JVD.  No lower extremity edema.  Gastrointestinal system: Abdomen is soft, nontender, nondistended, positive bowel sounds.  No rebound.  No guarding.  Central nervous system: Alert and oriented. No focal neurological deficits. Extremities: Symmetric 5 x 5 power. Skin: No rashes, lesions or ulcers Psychiatry: Judgement and insight appear normal. Mood & affect appropriate.     Data Reviewed: I have personally reviewed following labs and imaging studies  CBC: Recent Labs  Lab 02/11/21 1135 02/12/21 1322 02/15/21 1448 02/16/21 0338 02/16/21 1500 02/17/21 0821  WBC 9.5 7.4 10.4 12.1*  --  10.7*  NEUTROABS 7.4 5.7 8.7*  --   --  8.6*  HGB 9.0* 7.6* 8.4* 7.3* 7.1* 8.8*  HCT 28.7* 24.3* 27.1*  23.3* 22.6* 27.2*  MCV 83.9 80.7 83.4 83.5  --  84.0  PLT 296 251 264 217  --  245    Basic Metabolic Panel: Recent Labs  Lab 02/11/21 1135 02/12/21 1322 02/15/21 1448 02/16/21 0338 02/17/21 1021  NA 138 140 137 138 135  K 3.8 3.7 3.7 3.8 3.2*  CL 105 108 104 107 104  CO2 20* 21* 21* 21* 23  GLUCOSE 145* 126* 138* 118* 105*  BUN 37* 32* 28* 23 19  CREATININE 1.49* 1.28* 1.03 1.11 1.14  CALCIUM 9.4 9.2 9.2 8.6* 8.4*  MG 2.4  --   --   --  1.9    GFR: Estimated Creatinine Clearance: 54.9 mL/min (by C-G formula based on SCr of 1.14 mg/dL).  Liver Function Tests: Recent Labs  Lab 02/11/21 1135 02/12/21 1322 02/15/21 1448  AST 30 27 28   ALT 18 15 15   ALKPHOS 135* 131* 109  BILITOT 1.6* 1.1 1.3*  PROT 8.4* 7.3 7.5  ALBUMIN 3.4* 2.8* 3.1*    CBG: Recent Labs  Lab 02/16/21 0513 02/17/21 0826  GLUCAP 114* 95     Recent Results (from the past 240 hour(s))  Blood culture (routine x 2)     Status: None (Preliminary result)   Collection Time: 02/15/21  2:14 PM   Specimen: BLOOD RIGHT HAND  Result Value Ref Range Status  Specimen Description   Final    BLOOD RIGHT HAND Performed at [facility] [address], [place], [place] [address]    Special Requests   Final    BOTTLES DRAWN AEROBIC AND ANAEROBIC Blood Culture results may not be optimal due to an inadequate volume of blood received in culture bottles Performed at [facility] [address], [place], [place] [address]    Culture   Final    NO GROWTH 2 DAYS Performed at [facility] [address] [place], [place] [address]    Report Status PENDING  Incomplete  Resp Panel by RT-PCR (Flu A&B, Covid) Nasopharyngeal Swab     Status: None   Collection Time: 02/15/21  2:48 PM   Specimen: Nasopharyngeal Swab; Nasopharyngeal(NP) swabs in vial transport medium  Result Value Ref Range Status   SARS Coronavirus 2 by RT PCR NEGATIVE NEGATIVE Final    Comment:  (NOTE) SARS-CoV-2 target nucleic acids are NOT DETECTED.  The SARS-CoV-2 RNA is generally detectable in upper respiratory specimens during the acute phase of infection. The lowest concentration of SARS-CoV-2 viral copies this assay can detect is 138 copies/mL. A negative result does not preclude SARS-Cov-2 infection and should not be used as the sole basis for treatment or other patient management decisions. A negative result may occur with  improper specimen collection/handling, submission of specimen other than nasopharyngeal swab, presence of viral mutation(s) within the areas targeted by this assay, and inadequate number of viral copies(<138 copies/mL). A negative result must be combined with clinical observations, patient history, and epidemiological information. The expected result is Negative.  Fact Sheet for Patients:  EntrepreneurPulse.com.au  Fact Sheet for Healthcare Providers:  IncredibleEmployment.be  This test is no t yet approved or cleared by the [place] FDA and  has been authorized for detection and/or diagnosis of SARS-CoV-2 by FDA under an Emergency Use Authorization (EUA). This EUA will remain  in effect (meaning this test can be used) for the duration of the COVID-19 declaration under Section 564(b)(1) of the Act, 21 U.S.C.section 360bbb-3(b)(1), unless the authorization is terminated  or revoked sooner.       Influenza A by PCR NEGATIVE NEGATIVE Final   Influenza B by PCR NEGATIVE NEGATIVE Final    Comment: (NOTE) The Xpert Xpress SARS-CoV-2/FLU/RSV plus assay is intended as an aid in the diagnosis of influenza from Nasopharyngeal swab specimens and should not be used as a sole basis for treatment. Nasal washings and aspirates are unacceptable for Xpert Xpress SARS-CoV-2/FLU/RSV testing.  Fact Sheet for Patients: EntrepreneurPulse.com.au  Fact Sheet for Healthcare  Providers: IncredibleEmployment.be  This test is not yet approved or cleared by the [place] FDA and has been authorized for detection and/or diagnosis of SARS-CoV-2 by FDA under an Emergency Use Authorization (EUA). This EUA will remain in effect (meaning this test can be used) for the duration of the COVID-19 declaration under Section 564(b)(1) of the Act, 21 U.S.C. section 360bbb-3(b)(1), unless the authorization is terminated or revoked.  Performed at [facility], [place] [address] [place], [place] [address]   Blood culture (routine x 2)     Status: None (Preliminary result)   Collection Time: 02/15/21  2:49 PM   Specimen: BLOOD  Result Value Ref Range Status   Specimen Description   Final    BLOOD PORTA CATH Performed at [facility] [address] [place], [place] [address]    Special Requests   Final  BOTTLES DRAWN AEROBIC AND ANAEROBIC Blood Culture adequate volume Performed at [facility] [address], [place], [place] [address]    Culture   Final    NO GROWTH 2 DAYS Performed at [facility] [address] [place], [place] [address]    Report Status PENDING  Incomplete  Urine Culture     Status: None   Collection Time: 02/16/21  2:50 PM   Specimen: Urine, Clean Catch  Result Value Ref Range Status   Specimen Description   Final    URINE, CLEAN CATCH Performed at [facility], [place] [address], [place], [place] [address]    Special Requests   Final    NONE Performed at [facility], [place] [address] [place], [place] [address]    Culture   Final    NO GROWTH Performed at [facility], [place] [address], [place], [place] [address]    Report Status 02/17/2021 FINAL  Final         Radiology Studies: No results found.      Scheduled Meds:  aspirin EC  81 mg Oral Daily   atorvastatin  80 mg Oral q1800   bisacodyl  10 mg  Rectal Once   Chlorhexidine Gluconate Cloth  6 each Topical Daily   enoxaparin (LOVENOX) injection  40 mg Subcutaneous Q24H   feeding supplement  237 mL Oral TID BM   magic mouthwash w/lidocaine  15 mL Oral QID   melatonin  3 mg Oral QHS   metoCLOPramide (REGLAN) injection  10 mg Intravenous Q8H   metoprolol succinate  50 mg Oral QHS   mirtazapine  15 mg Oral QHS   multivitamin with minerals  1 tablet Oral Daily   sodium chloride flush  3 mL Intravenous Q12H   Continuous Infusions:  lactated ringers 100 mL/hr at 02/16/21 1131     LOS: 1 day    Time spent: 35 minutes    Irine Seal, MD [facility]   To contact the attending provider between 7A-7P or the covering provider during after hours 7P-7A, please log into the web site [email or web address] and access using universal [place] password for that web site. If you do not have the password, please call the hospital operator.  02/17/2021, 6:05 PM

## 2021-02-17 NOTE — Progress Notes (Signed)
Initial Nutrition Assessment  DOCUMENTATION CODES:   Severe malnutrition in context of chronic illness  INTERVENTION:  - continue Ensure Enlive but will increase from BID to TID, each supplement provides 350 kcal and 20 grams of protein. - will order 1 tablet multivitamin with minerals. - if poor PO intakes persist and patient to remain Full Code, recommend small bore NGT and initiation of TF.   NUTRITION DIAGNOSIS:   Severe Malnutrition related to chronic illness as evidenced by severe fat depletion, severe muscle depletion, percent weight loss.  GOAL:   Patient will meet greater than or equal to 90% of their needs  MONITOR:   PO intake, Supplement acceptance, Labs, Weight trends  REASON FOR ASSESSMENT:   Malnutrition Screening Tool  ASSESSMENT:   75 year old male with medical history of stage 4 urothelial carcinoma with hepatic and osseous mets s/p radical resection to R humerus recently started on salvage/palliative chemo, [name], moderate AAS status post AVR, postop A. fib without recurrence, first-degree AV block, LBBB, anemia of chronic disease, HTN, and HLD. He presented to the ED after a syncopal episode. He has been experiencing N/V, generalized weakness, fatigue, and inability to maintain adequate oral intake with associated lightheadedness.  Patient laying in bed with no family or visitors present at the time of RD visit. Patient had just worked with PT.   Patient had taken a few small bites of lunch meal of spaghetti with meat sauce. He shares that this is something that he often tolerates well. He states that he has had a poor appetite for the past ~2 months and that he has been drinking Ensure (or equivalent) at home and tolerates these well.  He has near constant nausea and has compazine at home which he does find helpful. He takes it 30 minutes before eating and this is beneficial, but he still struggles with lack of appetite and interest in eating and has  also had difficulty with swallowing. He shares that this is due to very dry mouth and throat and that he recently has also been having oral blisters/ulcers which make eating painful and difficult.  He states that when he was dx with cancer 18 months ago he weighed 233 lb. Weight on 10/29 was 158 lb and weight on 9/29 was 169 lb. This indicates 11 lb weight loss (6.5% body weight) in the past 1 month; significant for time frame.   Based on reported weight at time of dx, he has lost 75 lb (32% body weight) in the past 18 months; significant for time frame.  He is R-handed and has had weakness in R hand and arm s/p humerus surgery and is not able to fully close his hand.    Labs reviewed; CBG: 95 mg/dl, K: 3.2 mmol/l, Ca: 8.4 mg/dl. Medications reviewed; 3 mg melatonin/day, 10 mg IV reglan TID, 40 mEq Klor-Con x1 dose 10/31.  IVF; LR @ 100 ml/hr.     NUTRITION - FOCUSED PHYSICAL EXAM:  Flowsheet Row Most Recent Value  Orbital Region Moderate depletion  Upper Arm Region Severe depletion  Thoracic and Lumbar Region Unable to assess  Buccal Region Severe depletion  Temple Region Moderate depletion  Clavicle Bone Region Severe depletion  Clavicle and Acromion Bone Region Severe depletion  Scapular Bone Region Unable to assess  Dorsal Hand Mild depletion  Patellar Region Severe depletion  Anterior Thigh Region Severe depletion  Posterior Calf Region Moderate depletion  Edema (RD Assessment) None  Hair Reviewed  Eyes Reviewed  Mouth Reviewed  [  oral blisters/ulcers]  Skin Reviewed  Nails Reviewed       Diet Order:   Diet Order             DIET SOFT Room service appropriate? Yes; Fluid consistency: Thin  Diet effective now                   EDUCATION NEEDS:   No education needs have been identified at this time  Skin:  Skin Assessment: Reviewed RN Assessment  Last BM:  10/31 (type 6 x1)  Height:   Ht Readings from Last 1 Encounters:  02/15/21 6' (1.829 m)     Weight:   Wt Readings from Last 1 Encounters:  02/15/21 71.5 kg     Estimated Nutritional Needs:  Kcal:  2150-2300 kcal Protein:  115-125 grams Fluid:  >/= 2.5 L/day     Jarome Matin, MS, RD, LDN, CNSC Inpatient Clinical Dietitian RD pager # available in AMION  After hours/weekend pager # available in [facility]

## 2021-02-17 NOTE — Plan of Care (Signed)
  Problem: Education: Goal: Knowledge of General Education information will improve Description: Including pain rating scale, medication(s)/side effects and non-pharmacologic comfort measures Outcome: Progressing   Problem: Nutrition: Goal: Adequate nutrition will be maintained Outcome: Progressing   Problem: Coping: Goal: Level of anxiety will decrease Outcome: Progressing   

## 2021-02-17 NOTE — TOC Initial Note (Signed)
Transition of Care [facility]) - Initial/Assessment Note    Patient Details  Name: Lawrence Hall MRN: [id number] Date of Birth: 01-18-1946  [facility] CM/SW Contact:    Dessa Phi, RN Phone Number: 02/17/2021, 3:53 PM  Clinical Narrative:  referral for asst w/d/c plans. From home w/spouse. OT recc SNF vs HH-will await PT recc.                Expected Discharge Plan:  (TBD) Barriers to Discharge: Continued Medical Work up   Patient Goals and CMS Choice Patient states their goals for this hospitalization and ongoing recovery are:: go home CMS Medicare.gov Compare Post Acute Care list provided to:: Patient Represenative (must comment) (Debra spouse) Choice offered to / list presented to : Spouse  Expected Discharge Plan and Services Expected Discharge Plan:  (TBD)   Discharge Planning Services: CM Consult   Living arrangements for the past 2 months: Single Family Home                                      Prior Living Arrangements/Services Living arrangements for the past 2 months: Single Family Home Lives with:: Spouse Patient language and need for interpreter reviewed:: Yes Do you feel safe going back to the place where you live?: Yes      Need for Family Participation in Patient Care: No (Comment) Care giver support system in place?: Yes (comment)   Criminal Activity/Legal Involvement Pertinent to Current Situation/Hospitalization: No - Comment as needed  Activities of Daily Living Home Assistive Devices/Equipment: Eyeglasses ADL Screening (condition at time of admission) Patient's cognitive ability adequate to safely complete daily activities?: Yes Is the patient deaf or have difficulty hearing?: No Does the patient have difficulty seeing, even when wearing glasses/contacts?: No Does the patient have difficulty concentrating, remembering, or making decisions?: No Patient able to express need for assistance with ADLs?: Yes Does the patient have  difficulty dressing or bathing?: Yes Independently performs ADLs?: Yes (appropriate for developmental age) Does the patient have difficulty walking or climbing stairs?: Yes Weakness of Legs: Both Weakness of Arms/Hands: None  Permission Sought/Granted Permission sought to share information with : Case Manager Permission granted to share information with : Yes, Verbal Permission Granted  Share Information with NAME: Case manager           Emotional Assessment Appearance:: Appears stated age Attitude/Demeanor/Rapport: Gracious Affect (typically observed): Accepting Orientation: : Oriented to Self Alcohol / Substance Use: Not Applicable Psych Involvement: No (comment)  Admission diagnosis:  Syncope and collapse [R55] Dehydration [E86.0] Syncope [R55] Weakness [R53.1] Patient Active Problem List   Diagnosis Date Noted   Dehydration    Syncope and collapse    Syncope 02/15/2021   Nausea and vomiting 01/24/2021   Intractable vomiting with nausea 01/23/2021   Anemia 01/23/2021   Hyponatremia 01/23/2021   Metastasis to bone ([place]) 10/30/2020   Goals of care, counseling/discussion 09/06/2020   Malignant tumor of renal pelvis, left ([place]) 01/04/2020   Port-A-Cath in place 11/02/2019   Right rotator cuff tear 09/19/2019   History of aortic valve replacement with bioprosthetic valve 05/18/2018   Postoperative atrial fibrillation ([place]) 05/18/2018   LBBB (left bundle branch block) 05/18/2018   S/P CABG x 3 04/29/2018   CAD (coronary artery disease), native coronary artery 04/26/2018   Near syncope    Nonrheumatic aortic valve stenosis    Non-STEMI (non-ST elevated myocardial infarction) ([place])  Essential hypertension 06/01/2015   Asthma 05/31/2015   Elevated troponin 05/31/2015   Arthritis    Hyperlipidemia    ED (erectile dysfunction)    PCP:  [name], MD Pharmacy:   [facility], [place] [address] Phone: [phone] Fax: [phone]     Social Determinants of Health (SDOH) Interventions    Readmission Risk Interventions No flowsheet data found.

## 2021-02-17 NOTE — Evaluation (Signed)
Occupational Therapy Evaluation Patient Details Name: Lawrence Hall MRN: [id number] DOB: October 03, 1945 Today's Date: 02/17/2021   History of Present Illness Patient is a 75 year old male who presented to the hosptial after a fall at home. patient was admitted with syncope, and dehydration. KNL:ZJQBH IV urothelial carcinoma with hepatic and osseous mets, [name], a fib, and hyperlipidemia.   Clinical Impression   Patient is a 75 year old male who was living at home with wife with independence in ADL prior level. Currently, Patient was noted to have increased fatigue today with orthostatic BP with sitting on edge of bed. Patient was noted to have decreased functional activity tolerance, decreased endurance, decreased sitting balance, decreased cardiopulmonary tolerance impacting participation in ADLs. Patient would continue to benefit from skilled OT services at this time while admitted and after d/c to address noted deficits in order to improve overall safety and independence in ADLs.    Patients blood pressures: Supine: 151/86 mmhg HR: 90 bpm Sitting EOB : 136/78 mmhg HR: 99bpm Sitting Eob after 2 mins 135/72 mmhg  Patient endorsed dizziness sitting on edge of bed. Nurse made aware.      Recommendations for follow up therapy are one component of a multi-disciplinary discharge planning process, led by the attending physician.  Recommendations may be updated based on patient status, additional functional criteria and insurance authorization.   Follow Up Recommendations  Skilled nursing-short term rehab (<3 hours/day) or Economy pending progress   Assistance Recommended at Discharge Frequent or constant Supervision/Assistance  Functional Status Assessment  Patient has had a recent decline in their functional status and demonstrates the ability to make significant improvements in function in a reasonable and predictable amount of time.  Equipment Recommendations       Recommendations for Other  Services       Precautions / Restrictions Precautions Precautions: Fall Precaution Comments: orthostatic Restrictions Weight Bearing Restrictions: No      Mobility Bed Mobility Overal bed mobility: Needs Assistance Bed Mobility: Supine to Sit;Sit to Supine     Supine to sit: Min assist;HOB elevated Sit to supine: Min assist   General bed mobility comments: patient needed increased time to participate in all tasks. patient was able to sit EOB for about 5 mins with increased dizziness. returned to supine    Transfers                          Balance Overall balance assessment: Mild deficits observed, not formally tested                                         ADL either performed or assessed with clinical judgement   ADL Overall ADL's : Needs assistance/impaired   Eating/Feeding Details (indicate cue type and reason): patient is on full fluid diet. Grooming: Wash/dry face;Sitting Grooming Details (indicate cue type and reason): EOB Upper Body Bathing: Minimal assistance;Sitting   Lower Body Bathing: Sitting/lateral leans;Moderate assistance   Upper Body Dressing : Sitting;Minimal assistance   Lower Body Dressing: Sitting/lateral leans;Moderate assistance     Toilet Transfer Details (indicate cue type and reason): unable to transfer with increased dizziness and drop in BP with increased time sitting on edge of bed. nurse made aware. Toileting- Clothing Manipulation and Hygiene: Bed level;Moderate assistance               Vision  Patient Visual Report: No change from baseline       Perception     Praxis      Pertinent Vitals/Pain Pain Assessment: No/denies pain     Hand Dominance Right   Extremity/Trunk Assessment Upper Extremity Assessment Upper Extremity Assessment: Generalized weakness   Lower Extremity Assessment Lower Extremity Assessment: Defer to PT evaluation   Cervical / Trunk Assessment Cervical / Trunk  Assessment: Kyphotic   Communication Communication Communication: No difficulties   Cognition Arousal/Alertness: Awake/alert Behavior During Therapy: Flat affect Overall Cognitive Status: Within Functional Limits for tasks assessed                                 General Comments: patient was noted to be fatigued during session with increased time to answer questions.     General Comments       Exercises     Shoulder Instructions      Home Living Family/patient expects to be discharged to:: Private residence Living Arrangements: Spouse/significant other Available Help at Discharge: Family;Available 24 hours/day Type of Home: House Home Access: Stairs to enter [employer] of Steps: 10 front entrance Entrance Stairs-Rails: Can reach both Home Layout: One level               Home Equipment: None   Additional Comments: wife was not present in room. patient was noted to have some confusion during session.      Prior Functioning/Environment Prior Level of Function : Independent/Modified Independent                        OT Problem List: Decreased strength;Decreased activity tolerance;Impaired balance (sitting and/or standing);Decreased safety awareness;Decreased cognition;Cardiopulmonary status limiting activity;Decreased knowledge of precautions;Decreased knowledge of use of DME or AE      OT Treatment/Interventions:      OT Goals(Current goals can be found in the care plan section) Acute Rehab OT Goals Patient Stated Goal: to see his wife OT Goal Formulation: With patient Time For Goal Achievement: 03/03/21 Potential to Achieve Goals: Good  OT Frequency:     Barriers to D/C:            Co-evaluation              AM-PAC OT "6 Clicks" Daily Activity     Outcome Measure Help from another person eating meals?: A Little Help from another person taking care of personal grooming?: A Little Help from another person  toileting, which includes using toliet, bedpan, or urinal?: A Lot Help from another person bathing (including washing, rinsing, drying)?: A Lot Help from another person to put on and taking off regular upper body clothing?: A Little Help from another person to put on and taking off regular lower body clothing?: A Lot 6 Click Score: 15   End of Session Nurse Communication: Other (comment) (orthostatic BP and patients position at end of session)  Activity Tolerance: Patient limited by fatigue;Patient limited by lethargy Patient left: in bed;with call bell/phone within reach;with bed alarm set  OT Visit Diagnosis: Unsteadiness on feet (R26.81);Muscle weakness (generalized) (M62.81)                Time: 7989-2119 OT Time Calculation (min): 23 min Charges:  OT General Charges $OT Visit: 1 Visit OT Evaluation $OT Eval Moderate Complexity: 1 Mod OT Treatments $Self Care/Home Management : 8-22 mins  Lawrence Hall OTR/L, MS Acute Rehabilitation Department  Office# [phone] Pager# [phone]   Lawrence Hall 02/17/2021, 1:06 PM

## 2021-02-17 NOTE — Progress Notes (Signed)
IP PROGRESS NOTE  Subjective:   Events noted over the last few days.  Patient hospitalized after sustaining syncopal episode related to dehydration and anemia.  He received 2 units of packed red cells and is feeling better.  He has been eating clear diet and developed to try more solid foods.  He denies any chest pain shortness of breath or difficulty breathing.  Objective:  Vital signs in last 24 hours: Temp:  [98 F (36.7 C)-99 F (37.2 C)] 98.3 F (36.8 C) (10/31 0557) Pulse Rate:  [79-116] 79 (10/31 0557) Resp:  [7-26] 20 (10/31 0557) BP: (102-154)/(62-83) 124/78 (10/31 0557) SpO2:  [97 %-100 %] 99 % (10/31 0557) Weight change:     Intake/Output from previous day: 10/30 0701 - 10/31 0700 In: 1761.7 [I.V.:1131.7; Blood:630] Out: -  General: Alert, awake without distress.  Chronically ill-appearing. Head: Normocephalic atraumatic. Mouth: mucous membranes moist, pharynx normal without lesions Eyes: No scleral icterus.  Pupils are equal and round reactive to light. Resp: clear to auscultation bilaterally without rhonchi or wheezes or dullness to percussion. Cardio: regular rate and rhythm, S1, S2 normal, no murmur, click, rub or gallop GI: soft, non-tender; bowel sounds normal; no masses,  no organomegaly Musculoskeletal: No joint deformity or effusion. Neurological: No motor, sensory deficits.  Intact deep tendon reflexes. Skin: No rashes or lesions.  Portacath without erythema  Lab Results: Recent Labs    02/15/21 1448 02/16/21 0338 02/16/21 1500  WBC 10.4 12.1*  --   HGB 8.4* 7.3* 7.1*  HCT 27.1* 23.3* 22.6*  PLT 264 217  --     BMET Recent Labs    02/15/21 1448 02/16/21 0338  NA 137 138  K 3.7 3.8  CL 104 107  CO2 21* 21*  GLUCOSE 138* 118*  BUN 28* 23  CREATININE 1.03 1.11  CALCIUM 9.2 8.6*    Studies/Results: CT Angio Chest PE W and/or Wo Contrast  Result Date: 02/15/2021 CLINICAL DATA:  75 year old male with acute chest pain and syncope.  EXAM: CT ANGIOGRAPHY CHEST WITH CONTRAST TECHNIQUE: Multidetector CT imaging of the chest was performed using the standard protocol during bolus administration of intravenous contrast. Multiplanar CT image reconstructions and MIPs were obtained to evaluate the vascular anatomy. CONTRAST:  35mL OMNIPAQUE IOHEXOL 350 MG/ML SOLN COMPARISON:  09/03/2020 and prior CTs FINDINGS: Cardiovascular: This is a technically adequate study but respiratory motion artifact in the LOWER lungs slightly decreases sensitivity. No pulmonary emboli are identified. CABG aortic valve replacement noted. Thoracic aortic atherosclerotic calcifications are noted without aneurysm. No pericardial effusion. A RIGHT IJ Port-A-Cath is noted with tip at the SUPERIOR cavoatrial junction. Mediastinum/Nodes: No enlarged mediastinal, hilar, or axillary lymph nodes. Thyroid gland, trachea, and esophagus demonstrate no significant findings. Lungs/Pleura: Lungs are clear. No pleural effusion or pneumothorax. Upper Abdomen: No acute abnormality. Musculoskeletal: No acute or suspicious bony abnormalities are noted. Surgical hardware within the proximal RIGHT humerus and median sternotomy wires are present. Review of the MIP images confirms the above findings. IMPRESSION: No evidence of acute abnormality. No evidence of pulmonary emboli. Aortic Atherosclerosis (ICD10-I70.0). Electronically Signed   By: Margarette Canada M.D.   On: 02/15/2021 16:58   DG Chest Portable 1 View  Result Date: 02/15/2021 CLINICAL DATA:  Syncopal episode. EXAM: PORTABLE CHEST 1 VIEW COMPARISON:  Chest CT 09/03/2020 FINDINGS: The cardiac silhouette, mediastinal and hilar contours are within normal limits. Stable surgical changes from bypass surgery. Right IJ power port is in good position without complicating features. No acute pulmonary findings. No  pulmonary lesions or pleural effusions. The bony thorax is intact. IMPRESSION: No acute cardiopulmonary findings. Electronically Signed    By: Marijo Sanes M.D.   On: 02/15/2021 14:56    Medications: I have reviewed the patient's current medications.  Assessment/Plan:  75 year old with:  1.  Syncope and failure to thrive: Multifactorial in nature related to dehydration and anemia.  He received intravenous fluids and packed red cell transfusion with improvement in his symptoms.  CT scan of the chest did not show any pulmonary embolism.  His symptoms appear to be improving.  2.  Bladder cancer: He is currently on palliative chemotherapy and remains marginal candidate for any additional treatment.  Risks and benefits of continuing treatment were discussed and at this time he desired to continue aggressive measures which remains reasonable given his recent improvement.  3.  Anemia: Multifactorial related to malignancy and chemotherapy.  I agree with the transfusion and repeat CBC is pending.  4.  Follow-up in disposition: I have no objections to discharge once I feel medically ready by the primary team.  Follow-up at the cancer center is already established.  25  minutes were dedicated to this visit.  50% of the time was face-to-face and the the time was spent on reviewing laboratory data, imaging studies, discussing treatment options, and answering questions regarding future plan.    LOS: 1 day   [name] 02/17/2021, 7:46 AM

## 2021-02-18 LAB — TYPE AND SCREEN
ABO/RH(D): A POS
Antibody Screen: NEGATIVE
Unit division: 0
Unit division: 0

## 2021-02-18 LAB — CBC
HCT: 27.6 % — ABNORMAL LOW (ref 39.0–52.0)
Hemoglobin: 9 g/dL — ABNORMAL LOW (ref 13.0–17.0)
MCH: 27.2 pg (ref 26.0–34.0)
MCHC: 32.6 g/dL (ref 30.0–36.0)
MCV: 83.4 fL (ref 80.0–100.0)
Platelets: 199 10*3/uL (ref 150–400)
RBC: 3.31 MIL/uL — ABNORMAL LOW (ref 4.22–5.81)
RDW: 16.7 % — ABNORMAL HIGH (ref 11.5–15.5)
WBC: 10.1 10*3/uL (ref 4.0–10.5)
nRBC: 0 % (ref 0.0–0.2)

## 2021-02-18 LAB — BPAM RBC
Blood Product Expiration Date: 202211272359
Blood Product Expiration Date: 202211272359
ISSUE DATE / TIME: 202210302324
ISSUE DATE / TIME: 202210310234
Unit Type and Rh: 6200
Unit Type and Rh: 6200

## 2021-02-18 LAB — BASIC METABOLIC PANEL
Anion gap: 8 (ref 5–15)
BUN: 16 mg/dL (ref 8–23)
CO2: 23 mmol/L (ref 22–32)
Calcium: 8.3 mg/dL — ABNORMAL LOW (ref 8.9–10.3)
Chloride: 102 mmol/L (ref 98–111)
Creatinine, Ser: 1.12 mg/dL (ref 0.61–1.24)
GFR, Estimated: >60 mL/min (ref >60)
Glucose, Bld: 110 mg/dL — ABNORMAL HIGH (ref 70–99)
Potassium: 3.6 mmol/L (ref 3.5–5.1)
Sodium: 133 mmol/L — ABNORMAL LOW (ref 135–145)

## 2021-02-18 MED ORDER — LACTATED RINGERS IV SOLN: INTRAVENOUS | Status: AC

## 2021-02-18 MED ORDER — BISACODYL 10 MG RE SUPP: 10.0000 mg | Freq: Once | RECTAL | Status: DC

## 2021-02-18 MED ORDER — LIP MEDEX EX OINT
TOPICAL_OINTMENT | CUTANEOUS | Status: DC | PRN
  Administered 2021-02-18: 75 via TOPICAL
  Filled 2021-02-18: qty 7

## 2021-02-18 MED ORDER — POTASSIUM CHLORIDE CRYS ER 20 MEQ PO TBCR
40.0000 meq | EXTENDED_RELEASE_TABLET | Freq: Once | ORAL | Status: AC
  Administered 2021-02-18: 40 meq via ORAL
  Filled 2021-02-18: qty 2

## 2021-02-18 MED ORDER — METOCLOPRAMIDE HCL 5 MG/ML IJ SOLN: 5.0000 mg | Freq: Three times a day (TID) | INTRAMUSCULAR | Status: DC | PRN

## 2021-02-18 NOTE — Progress Notes (Signed)
PROGRESS NOTE    Lawrence Hall  VFI:[id number] DOB: 11-16-1945 DOA: 02/15/2021 PCP: [name], MD    Chief Complaint  Patient presents with   Loss of Consciousness   Emesis    Brief Narrative: Patient 75 year old gentleman history of stage IV urothelial carcinoma with hepatic and osseous mets status post radical resection to right humerus recently started on salvage chemotherapy, history of CAD status post CABG, moderate AAS status post AVR, postop A. fib without recurrence, first-degree AV block, LBBB, anemia of chronic disease, hypertension, hyperlipidemia presenting to the ED after syncopal episode around 4 AM on the day of admission.  Patient noted to have started recent palliative chemotherapy 02/12/2021 received hydration pre and posttreatment did well on the first day however since then has had fatigue, generalized weakness, nausea vomiting, inability to maintain any adequate oral intake with lightheadedness resulting in a syncopal episode.  On presentation to the ED patient noted to have a blood pressure 100/70, pulse of 118, sats of 99% on room air.  Cysts COVID-19 PCR negative.  Chest x-ray negative for any acute infiltrates.  CTA chest negative for PE.  Patient placed on IV Reglan, IV fluids, supportive care.   Assessment & Plan:   Principal Problem:   Syncope Active Problems:   Essential hypertension   CAD (coronary artery disease), native coronary artery   Malignant tumor of renal pelvis, left (HCC)   Anemia   Nausea and vomiting   Protein-calorie malnutrition, severe   Hypokalemia   Weakness   1 syncope -Likely secondary to hypovolemia secondary to dehydration/orthostasis in the setting of ongoing nausea vomiting decreased oral intake after recent chemotherapy. -Patient on IV fluids and improving clinically.   -Patient was on clears which he tolerated diet advanced to a soft diet which he is tolerating.   -Discontinue scheduled IV Reglan.   -Continue IV  antiemetics, supportive care .  2.  Dehydration -IV fluids.  3.  Anemia of chronic disease -Likely secondary to recent chemotherapy. -Patient with no overt bleeding. -Hemoglobin noted at 7.1 on 02/16/2021, status post transfusion 2 units packed red blood cells hemoglobin currently at 9.0.  -Follow H&H. -Transfusion threshold hemoglobin < 7.  4.  Nausea/vomiting/decreased appetite -Patient with progressive failure to thrive related to disease progression of cancer, acutely worsened in the setting of chemotherapy. -Patient started on scheduled IV Reglan with Zofran as needed with some clinical improvement. -Patient was started on clears diet advanced to a soft diet which she is tolerating.   -Change scheduled IV Reglan to as needed.   -Continue antiemetics, supportive care.   5.  Stage IV urothelial carcinoma with hepatic and osseous mets -Patient being followed by oncologist Dr. Alen Blew who has been notified via epic of patient's admission. -Patient noted to have been started on Padcev 10/26. -Per oncology's most recent office note patient likely approaching end-stage status and may need hospice. -Patient seen by oncology and patient noted to be on Palliative chemo and felt to be a marginal candidate for any additional treatment and per oncology note patient desires to continue aggressive measures. -Per oncology.  6.  Hypertension -Continue Toprol-XL.    7.  Coronary artery disease status post CABG/moderate AS status post AVR/postop A. fib without recurrence/hyperlipidemia -Stable. -Continue statin, aspirin, Toprol-XL.  8.  Hypokalemia -Likely secondary to GI losses. -Potassium at 3.6.   -Follow.   9.  Severe protein calorie malnutrition -Continue nutritional supplementation.    DVT prophylaxis: Lovenox Code Status: Full Family Communication: Updated patient.  No family at bedside. Disposition:   Status: Inpatient  The patient will require care spanning > 2 midnights  and should be moved to inpatient because: Requirement of IV antibiotics, nausea vomiting inability to tolerate anything on admission, inpatient treatment necessary at this time.  Unsafe discharge likely will require SNF placement      Consultants:  Oncology: Dr. Alen Blew 02/17/2021  Procedures:  CT angiogram chest 02/15/2021 Chest x-ray 02/15/2021 Transfusion 2 units packed red blood cells.    Antimicrobials:  None   Subjective: More alert, following commands.  Feels weakness is slowly improving.  No chest pain.  No shortness of breath.  Tolerating soft diet.  States appetite is improving.   Objective: Vitals:   02/17/21 2347 02/18/21 0424 02/18/21 0800 02/18/21 1147  BP: 140/73 111/63 133/75 133/78  Pulse: (!) 116 (!) 108 95 92  Resp: (!) 24 18 (!) 22 20  Temp: 99.1 F (37.3 C) (!) 100.5 F (38.1 C) 98.3 F (36.8 C) 98.6 F (37 C)  TempSrc: Oral Oral Oral Oral  SpO2: 98% 97% 99% 97%  Weight:      Height:        Intake/Output Summary (Last 24 hours) at 02/18/2021 1330 Last data filed at 02/18/2021 0845 Gross per 24 hour  Intake 200 ml  Output --  Net 200 ml    Filed Weights   02/15/21 1341  Weight: 71.5 kg    Examination:  General exam: : NAD Respiratory system: CTA B.  No wheezes, no rhonchi.  Speaking in full sentences.  Normal respiratory effort. Cardiovascular system: Regular rate and rhythm no murmurs rubs or gallops.  No JVD.  No lower extremity edema.  Gastrointestinal system: Abdomen soft, nontender, nondistended, positive bowel sounds.  No rebound.  No guarding. Central nervous system: Alert and oriented. No focal neurological deficits. Extremities: Symmetric 5 x 5 power. Skin: No rashes, lesions or ulcers Psychiatry: Judgement and insight appear normal. Mood & affect appropriate.   Data Reviewed: I have personally reviewed following labs and imaging studies  CBC: Recent Labs  Lab 02/12/21 1322 02/15/21 1448 02/16/21 0338 02/16/21 1500  02/17/21 0821 02/18/21 0248  WBC 7.4 10.4 12.1*  --  10.7* 10.1  NEUTROABS 5.7 8.7*  --   --  8.6*  --   HGB 7.6* 8.4* 7.3* 7.1* 8.8* 9.0*  HCT 24.3* 27.1* 23.3* 22.6* 27.2* 27.6*  MCV 80.7 83.4 83.5  --  84.0 83.4  PLT 251 264 217  --  206 199     Basic Metabolic Panel: Recent Labs  Lab 02/12/21 1322 02/15/21 1448 02/16/21 0338 02/17/21 1021 02/18/21 0248  NA 140 137 138 135 133*  K 3.7 3.7 3.8 3.2* 3.6  CL 108 104 107 104 102  CO2 21* 21* 21* 23 23  GLUCOSE 126* 138* 118* 105* 110*  BUN 32* 28* 23 19 16   CREATININE 1.28* 1.03 1.11 1.14 1.12  CALCIUM 9.2 9.2 8.6* 8.4* 8.3*  MG  --   --   --  1.9  --      GFR: Estimated Creatinine Clearance: 55.9 mL/min (by C-G formula based on SCr of 1.12 mg/dL).  Liver Function Tests: Recent Labs  Lab 02/12/21 1322 02/15/21 1448  AST 27 28  ALT 15 15  ALKPHOS 131* 109  BILITOT 1.1 1.3*  PROT 7.3 7.5  ALBUMIN 2.8* 3.1*     CBG: Recent Labs  Lab 02/16/21 0513 02/17/21 0826  GLUCAP 114* 95      Recent Results (from  the past 240 hour(s))  Blood culture (routine x 2)     Status: None (Preliminary result)   Collection Time: 02/15/21  2:14 PM   Specimen: BLOOD RIGHT HAND  Result Value Ref Range Status   Specimen Description   Final    BLOOD RIGHT HAND Performed at [facility] [address], [place], [place] [address]    Special Requests   Final    BOTTLES DRAWN AEROBIC AND ANAEROBIC Blood Culture results may not be optimal due to an inadequate volume of blood received in culture bottles Performed at [facility] [address] [place], [place] [address]    Culture   Final    NO GROWTH 3 DAYS Performed at [facility], [place] [address] [place], [place] [address]    Report Status PENDING  Incomplete  Resp Panel by RT-PCR (Flu A&B, Covid) Nasopharyngeal Swab     Status: None   Collection Time: 02/15/21  2:48 PM   Specimen: Nasopharyngeal Swab; Nasopharyngeal(NP)  swabs in vial transport medium  Result Value Ref Range Status   SARS Coronavirus 2 by RT PCR NEGATIVE NEGATIVE Final    Comment: (NOTE) SARS-CoV-2 target nucleic acids are NOT DETECTED.  The SARS-CoV-2 RNA is generally detectable in upper respiratory specimens during the acute phase of infection. The lowest concentration of SARS-CoV-2 viral copies this assay can detect is 138 copies/mL. A negative result does not preclude SARS-Cov-2 infection and should not be used as the sole basis for treatment or other patient management decisions. A negative result may occur with  improper specimen collection/handling, submission of specimen other than nasopharyngeal swab, presence of viral mutation(s) within the areas targeted by this assay, and inadequate number of viral copies(<138 copies/mL). A negative result must be combined with clinical observations, patient history, and epidemiological information. The expected result is Negative.  Fact Sheet for Patients:  EntrepreneurPulse.com.au  Fact Sheet for Healthcare Providers:  IncredibleEmployment.be  This test is no t yet approved or cleared by the [place] FDA and  has been authorized for detection and/or diagnosis of SARS-CoV-2 by FDA under an Emergency Use Authorization (EUA). This EUA will remain  in effect (meaning this test can be used) for the duration of the COVID-19 declaration under Section 564(b)(1) of the Act, 21 U.S.C.section 360bbb-3(b)(1), unless the authorization is terminated  or revoked sooner.       Influenza A by PCR NEGATIVE NEGATIVE Final   Influenza B by PCR NEGATIVE NEGATIVE Final    Comment: (NOTE) The Xpert Xpress SARS-CoV-2/FLU/RSV plus assay is intended as an aid in the diagnosis of influenza from Nasopharyngeal swab specimens and should not be used as a sole basis for treatment. Nasal washings and aspirates are unacceptable for Xpert Xpress  SARS-CoV-2/FLU/RSV testing.  Fact Sheet for Patients: EntrepreneurPulse.com.au  Fact Sheet for Healthcare Providers: IncredibleEmployment.be  This test is not yet approved or cleared by the [place] FDA and has been authorized for detection and/or diagnosis of SARS-CoV-2 by FDA under an Emergency Use Authorization (EUA). This EUA will remain in effect (meaning this test can be used) for the duration of the COVID-19 declaration under Section 564(b)(1) of the Act, 21 U.S.C. section 360bbb-3(b)(1), unless the authorization is terminated or revoked.  Performed at [facility], [place] [address] [place],  [address]   Blood culture (routine x 2)     Status: None (Preliminary result)   Collection Time: 02/15/21  2:49 PM   Specimen: BLOOD  Result Value Ref Range Status   Specimen Description   Final    BLOOD PORTA CATH Performed at [facility] [address], [place], [place] [address]    Special Requests   Final    BOTTLES DRAWN AEROBIC AND ANAEROBIC Blood Culture adequate volume Performed at [facility] [address] [place], [place] [address]    Culture   Final    NO GROWTH 3 DAYS Performed at [facility], [place] [address], [place], [place] [address]    Report Status PENDING  Incomplete  Urine Culture     Status: None   Collection Time: 02/16/21  2:50 PM   Specimen: Urine, Clean Catch  Result Value Ref Range Status   Specimen Description   Final    URINE, CLEAN CATCH Performed at [facility], [place] [address], [place], [place] [address]    Special Requests   Final    NONE Performed at [facility], [place] [address], [place],  [address]    Culture   Final    NO GROWTH Performed at [facility], [place] [address] [place],  [address]    Report Status 02/17/2021 FINAL  Final          Radiology  Studies: No results found.      Scheduled Meds:  aspirin EC  81 mg Oral Daily   atorvastatin  80 mg Oral q1800   bisacodyl  10 mg Rectal Once   Chlorhexidine Gluconate Cloth  6 each Topical Daily   enoxaparin (LOVENOX) injection  40 mg Subcutaneous Q24H   feeding supplement  237 mL Oral TID BM   magic mouthwash w/lidocaine  15 mL Oral QID   melatonin  3 mg Oral QHS   metoprolol succinate  50 mg Oral QHS   mirtazapine  15 mg Oral QHS   multivitamin with minerals  1 tablet Oral Daily   sodium chloride flush  3 mL Intravenous Q12H   Continuous Infusions:     LOS: 2 days    Time spent: 35 minutes    Irine Seal, MD [facility]   To contact the attending provider between 7A-7P or the covering provider during after hours 7P-7A, please log into the web site [email or web address] and access using universal [place] password for that web site. If you do not have the password, please call the hospital operator.  02/18/2021, 1:30 PM

## 2021-02-18 NOTE — NC FL2 (Signed)
[place] LEVEL OF CARE SCREENING TOOL     IDENTIFICATION  Patient Name: Lawrence Hall Birthdate: 1945/08/14 Sex: male Admission Date (Current Location): 02/15/2021  [facility] and [place] Number:  Herbalist and Address:  [facility],  [place], [place]      Provider Number: [id number]  Attending Physician Name and Address:  Eugenie Filler, MD  Relative Name and Phone Number:  Viraaj Vorndran spouse [phone]    Current Level of Care: Hospital Recommended Level of Care: [place] Prior Approval Number:    Date Approved/Denied:   PASRR Number: [id number]  Discharge Plan: SNF    Current Diagnoses: Patient Active Problem List   Diagnosis Date Noted   Protein-calorie malnutrition, severe 02/17/2021   Hypokalemia 02/17/2021   Weakness    Dehydration    Syncope and collapse    Syncope 02/15/2021   Nausea and vomiting 01/24/2021   Intractable vomiting with nausea 01/23/2021   Anemia 01/23/2021   Hyponatremia 01/23/2021   Metastasis to bone ([place]) 10/30/2020   Goals of care, counseling/discussion 09/06/2020   Malignant tumor of renal pelvis, left ([place]) 01/04/2020   Port-A-Cath in place 11/02/2019   Right rotator cuff tear 09/19/2019   History of aortic valve replacement with bioprosthetic valve 05/18/2018   Postoperative atrial fibrillation ([place]) 05/18/2018   LBBB (left bundle branch block) 05/18/2018   S/P CABG x 3 04/29/2018   CAD (coronary artery disease), native coronary artery 04/26/2018   Near syncope    Nonrheumatic aortic valve stenosis    Non-STEMI (non-ST elevated myocardial infarction) ([place])    Essential hypertension 06/01/2015   Asthma 05/31/2015   Elevated troponin 05/31/2015   Arthritis    Hyperlipidemia    ED (erectile dysfunction)     Orientation RESPIRATION BLADDER Height & Weight     Self, Time, Situation, Place  Normal Continent Weight: 71.5 kg Height:  6' (182.9 cm)   BEHAVIORAL SYMPTOMS/MOOD NEUROLOGICAL BOWEL NUTRITION STATUS      Continent Diet (Soft)  AMBULATORY STATUS COMMUNICATION OF NEEDS Skin   Limited Assist Verbally Normal                       Personal Care Assistance Level of Assistance  Bathing, Feeding, Dressing Bathing Assistance: Limited assistance Feeding assistance: Limited assistance Dressing Assistance: Limited assistance     Functional Limitations Info  Sight, Hearing, Speech Sight Info: Impaired (Reading glasses) Hearing Info: Adequate Speech Info: Adequate    SPECIAL CARE FACTORS FREQUENCY  PT (By licensed PT), OT (By licensed OT)     PT Frequency:  (5x week) OT Frequency:  (5x week)            Contractures Contractures Info: Not present    Additional Factors Info  Code Status, Allergies Code Status Info:  (Full) Allergies Info:  (Pet dander, seasonal allergies = Runny nose, itchy eyes)           Current Medications (02/18/2021):  This is the current hospital active medication list Current Facility-Administered Medications  Medication Dose Route Frequency Provider Last Rate Last Admin   acetaminophen (TYLENOL) tablet 650 mg  650 mg Oral Q6H PRN Lenore Cordia, MD   650 mg at 02/16/21 9735   Or   acetaminophen (TYLENOL) suppository 650 mg  650 mg Rectal Q6H PRN Lenore Cordia, MD       aspirin EC tablet 81 mg  81 mg Oral Daily Posey Pronto,  Vishal R, MD   81 mg at 02/18/21 0959   atorvastatin (LIPITOR) tablet 80 mg  80 mg Oral q1800 Lenore Cordia, MD   80 mg at 02/17/21 1814   bisacodyl (DULCOLAX) suppository 10 mg  10 mg Rectal Once Eugenie Filler, MD       Chlorhexidine Gluconate Cloth 2 % PADS 6 each  6 each Topical Daily Eugenie Filler, MD   6 each at 02/18/21 1006   enoxaparin (LOVENOX) injection 40 mg  40 mg Subcutaneous Q24H Zada Finders R, MD   40 mg at 02/17/21 2143   feeding supplement (ENSURE ENLIVE / ENSURE PLUS) liquid 237 mL  237 mL Oral TID BM Eugenie Filler, MD   237 mL at  02/18/21 1006   lactated ringers infusion   Intravenous Continuous Eugenie Filler, MD 75 mL/hr at 02/18/21 1358 Bolus from Bag at 02/18/21 1358   lip balm (CARMEX) ointment   Topical PRN Eugenie Filler, MD       magic mouthwash w/lidocaine  15 mL Oral QID Eugenie Filler, MD   15 mL at 02/18/21 0959   melatonin tablet 3 mg  3 mg Oral QHS Zada Finders R, MD   3 mg at 02/17/21 2143   metoCLOPramide (REGLAN) injection 5 mg  5 mg Intravenous Q8H PRN Eugenie Filler, MD       metoprolol succinate (TOPROL-XL) 24 hr tablet 50 mg  50 mg Oral QHS Eugenie Filler, MD   50 mg at 02/17/21 2143   mirtazapine (REMERON) tablet 15 mg  15 mg Oral QHS Lenore Cordia, MD   15 mg at 02/17/21 2143   multivitamin with minerals tablet 1 tablet  1 tablet Oral Daily Eugenie Filler, MD       ondansetron [facility]) tablet 4 mg  4 mg Oral Q6H PRN Lenore Cordia, MD       Or   ondansetron [place]) injection 4 mg  4 mg Intravenous Q6H PRN Lenore Cordia, MD   4 mg at 02/16/21 2350   oxyCODONE (Oxy IR/ROXICODONE) immediate release tablet 5 mg  5 mg Oral Q4H PRN Eugenie Filler, MD       senna-docusate (Senokot-S) tablet 1 tablet  1 tablet Oral QHS PRN Lenore Cordia, MD   1 tablet at 02/16/21 2103   sodium chloride flush (NS) 0.9 % injection 10-40 mL  10-40 mL Intracatheter PRN Wyatt Portela, MD       sodium chloride flush (NS) 0.9 % injection 3 mL  3 mL Intravenous Q12H Lenore Cordia, MD   3 mL at 02/18/21 1231     Discharge Medications: Please see discharge summary for a list of discharge medications.  Relevant Imaging Results:  Relevant Lab Results:   Advertising account planner x2/Booster x2;ss#[phone], [place], [place]

## 2021-02-19 ENCOUNTER — Inpatient Hospital Stay: Payer: PPO

## 2021-02-19 LAB — CBC
HCT: 25.9 % — ABNORMAL LOW (ref 39.0–52.0)
Hemoglobin: 8.4 g/dL — ABNORMAL LOW (ref 13.0–17.0)
MCH: 27.1 pg (ref 26.0–34.0)
MCHC: 32.4 g/dL (ref 30.0–36.0)
MCV: 83.5 fL (ref 80.0–100.0)
Platelets: 179 10*3/uL (ref 150–400)
RBC: 3.1 MIL/uL — ABNORMAL LOW (ref 4.22–5.81)
RDW: 16.9 % — ABNORMAL HIGH (ref 11.5–15.5)
WBC: 9.1 10*3/uL (ref 4.0–10.5)
nRBC: 0 % (ref 0.0–0.2)

## 2021-02-19 LAB — BASIC METABOLIC PANEL
Anion gap: 7 (ref 5–15)
BUN: 16 mg/dL (ref 8–23)
CO2: 24 mmol/L (ref 22–32)
Calcium: 8.2 mg/dL — ABNORMAL LOW (ref 8.9–10.3)
Chloride: 101 mmol/L (ref 98–111)
Creatinine, Ser: 1.09 mg/dL (ref 0.61–1.24)
GFR, Estimated: >60 mL/min (ref >60)
Glucose, Bld: 100 mg/dL — ABNORMAL HIGH (ref 70–99)
Potassium: 4 mmol/L (ref 3.5–5.1)
Sodium: 132 mmol/L — ABNORMAL LOW (ref 135–145)

## 2021-02-19 LAB — MAGNESIUM: Magnesium: 1.7 mg/dL (ref 1.7–2.4)

## 2021-02-19 NOTE — Progress Notes (Signed)
Physical Therapy Treatment Patient Details Name: Lawrence Hall MRN: [id number] DOB: 1945-11-16 Today's Date: 02/19/2021   History of Present Illness 75 year old male who presented to the hospital after a fall at home. Patient was admitted with syncope, FTT with anemia and dehydration. XLK:GMWNU IV urothelial carcinoma with hepatic and osseous mets, [name], a fib, and hyperlipidemia.    PT Comments    Progressing with mobility. Pt denied dizziness during session. See orthostatic vitals section. Pt remains weak and fatigues easily. Continue to recommend ST rehab at SNF to regain strength and functional independence.    Recommendations for follow up therapy are one component of a multi-disciplinary discharge planning process, led by the attending physician.  Recommendations may be updated based on patient status, additional functional criteria and insurance authorization.  Follow Up Recommendations  Skilled nursing-short term rehab (<3 hours/day)     Assistance Recommended at Discharge Frequent or constant Supervision/Assistance  Equipment Recommendations   (TBD at next venue)    Recommendations for Other Services       Precautions / Restrictions Precautions Precautions: Fall Precaution Comments: monitor BP Restrictions Weight Bearing Restrictions: No     Mobility  Bed Mobility Overal bed mobility: Needs Assistance Bed Mobility: Supine to Sit     Supine to sit: Modified independent (Device/Increase time)     General bed mobility comments: Increased time.    Transfers Overall transfer level: Needs assistance Equipment used: Rolling walker (2 wheels) Transfers: Sit to/from Stand Sit to Stand: Min guard           General transfer comment: Close Min guard. Increased time. Pt denied dizziness.    Ambulation/Gait Ambulation/Gait assistance: Min assist Gait Distance (Feet): 40 Feet Assistive device: Rolling walker (2 wheels) Gait Pattern/deviations:  Step-through pattern;Decreased stride length     General Gait Details: Min A to steady throughout distance. Cues for safety, posture, RW proximity. Pt denied dizziness but continues to report weakness and fatigue.   Stairs             Wheelchair Mobility    Modified Rankin (Stroke Patients Only)       Balance Overall balance assessment: Needs assistance;History of Falls         Standing balance support: Bilateral upper extremity supported;During functional activity Standing balance-Leahy Scale: Poor                              Cognition Arousal/Alertness: Awake/alert Behavior During Therapy: Flat affect Overall Cognitive Status: Within Functional Limits for tasks assessed                                          Exercises      General Comments        Pertinent Vitals/Pain Pain Assessment: Faces Faces Pain Scale: Hurts little more Pain Location: back Pain Descriptors / Indicators: Sore;Aching Pain Intervention(s): Monitored during session;Repositioned    Home Living                          Prior Function            PT Goals (current goals can now be found in the care plan section) Progress towards PT goals: Progressing toward goals    Frequency    Min 3X/week  PT Plan Current plan remains appropriate    Co-evaluation              AM-PAC PT "6 Clicks" Mobility   Outcome Measure  Help needed turning from your back to your side while in a flat bed without using bedrails?: None Help needed moving from lying on your back to sitting on the side of a flat bed without using bedrails?: None Help needed moving to and from a bed to a chair (including a wheelchair)?: A Little Help needed standing up from a chair using your arms (e.g., wheelchair or bedside chair)?: A Little Help needed to walk in hospital room?: A Little Help needed climbing 3-5 steps with a railing? : A Lot 6 Click Score: 19     End of Session Equipment Utilized During Treatment: Gait belt Activity Tolerance: Patient limited by fatigue Patient left: in chair;with call bell/phone within reach;with family/visitor present;with chair alarm set   PT Visit Diagnosis: Muscle weakness (generalized) (M62.81);Difficulty in walking, not elsewhere classified (R26.2)     Time: 4709-2957 PT Time Calculation (min) (ACUTE ONLY): 18 min  Charges:  $Gait Training: 8-22 mins                         Doreatha Massed, PT Acute Rehabilitation  Office: [phone] Pager: [phone]

## 2021-02-19 NOTE — Care Management Important Message (Signed)
Important Message  Patient Details IM Letter given to the Patient. Name: Lawrence Hall MRN: [id number] Date of Birth: 1945-06-24   Medicare Important Message Given:  Yes     Kerin Salen 02/19/2021, 12:55 PM

## 2021-02-19 NOTE — Progress Notes (Signed)
PROGRESS NOTE    Lawrence Hall  ATF:[id number] DOB: 1946/03/13 DOA: 02/15/2021 PCP: [name], MD    Brief Narrative:  75 year old gentleman history of stage IV urothelial carcinoma with hepatic and osseous mets status post radical resection to right humerus recently started on salvage chemotherapy, history of CAD status post CABG, moderate AAS status post AVR, postop A. fib without recurrence, first-degree AV block, LBBB, anemia of chronic disease, hypertension, hyperlipidemia presenting to the ED after syncopal episode around 4 AM on the day of admission.  Patient noted to have started recent palliative chemotherapy 02/12/2021 received hydration pre and posttreatment did well on the first day however since then has had fatigue, generalized weakness, nausea vomiting, inability to maintain any adequate oral intake with lightheadedness resulting in a syncopal episode.  On presentation to the ED patient noted to have a blood pressure 100/70, pulse of 118, sats of 99% on room air.  Cysts COVID-19 PCR negative.  Chest x-ray negative for any acute infiltrates.  CTA chest negative for PE.  Patient placed on IV Reglan, IV fluids, supportive care  Assessment & Plan:   Principal Problem:   Syncope Active Problems:   Essential hypertension   CAD (coronary artery disease), native coronary artery   Malignant tumor of renal pelvis, left (HCC)   Anemia   Nausea and vomiting   Protein-calorie malnutrition, severe   Hypokalemia   Weakness  1 syncope -Likely secondary to hypovolemia secondary to dehydration/orthostasis in the setting of ongoing nausea vomiting decreased oral intake after recent chemotherapy. -Patient on IV fluids and slowly improving -Slowly advancing diet, will advance to regular diet today -Discontinue scheduled IV Reglan.   -Continue IV antiemetics, supportive care .   2.  Dehydration -IV fluids as tolerated  3.  Anemia of chronic disease -Likely secondary to recent  chemotherapy. -Patient with no overt bleeding. -Hemoglobin noted at 7.1 on 02/16/2021, status post transfusion 2 units packed red blood cells hemoglobin currently at 9.0.  -Continue to follow CBC trends -Transfusion threshold hemoglobin < 7.  4.  Nausea/vomiting/decreased appetite -Patient with progressive failure to thrive related to disease progression of cancer, acutely worsened in the setting of chemotherapy. -Patient started on scheduled IV Reglan with Zofran as needed with some clinical improvement. -Patient was started on clears diet advanced to a soft diet which she is tolerating.   -Change scheduled IV Reglan to as needed.   -Continue antiemetics, supportive care.   5.  Stage IV urothelial carcinoma with hepatic and osseous mets -Patient being followed by oncologist Dr. Alen Blew who has been notified via epic of patient's admission. -Patient noted to have been started on Padcev 10/26. -Per oncology's most recent office note patient likely approaching end-stage status and may need hospice. -Patient seen by oncology and patient noted to be on Palliative chemo and felt to be a marginal candidate for any additional treatment and per oncology note patient desires to continue aggressive measures. -Per oncology.  6.  Hypertension -Continue Toprol-XL as tolerated  7.  Coronary artery disease status post CABG/moderate AS status post AVR/postop A. fib without recurrence/hyperlipidemia -Stable. -Continue statin, aspirin, Toprol-XL.  8.  Hypokalemia -Likely secondary to GI losses. -Corrected -Continue to follow trends  9.  Severe protein calorie malnutrition -Continue nutritional supplementation.    DVT prophylaxis: Lovenox subq Code Status: Full Family Communication: Pt in room, family not at bedside  Status is: Inpatient  Remains inpatient appropriate because: Severity of illness   Consultants:  Oncology  Procedures:  CT  angiogram chest 02/15/2021 Chest x-ray  02/15/2021 Transfusion 2 units packed red blood cells.  Antimicrobials: Anti-infectives (From admission, onward)    None       Subjective: Still feeling somewhat nauseated. Tolerated soft diet today  Objective: Vitals:   02/19/21 0551 02/19/21 0600 02/19/21 1039 02/19/21 1403  BP: 133/80  120/76 123/81  Pulse: 99  95 93  Resp: 17  16 16   Temp: 98.3 F (36.8 C)  98.3 F (36.8 C) 99.1 F (37.3 C)  TempSrc: Oral  Oral Oral  SpO2: 95%  97% 97%  Weight:  71.9 kg    Height:        Intake/Output Summary (Last 24 hours) at 02/19/2021 1638 Last data filed at 02/19/2021 0940 Gross per 24 hour  Intake 1851.25 ml  Output --  Net 1851.25 ml   Filed Weights   02/15/21 1341 02/19/21 0600  Weight: 71.5 kg 71.9 kg    Examination: General exam: Awake, laying in bed, in nad Respiratory system: Normal respiratory effort, no wheezing Cardiovascular system: regular rate, s1, s2 Gastrointestinal system: Soft, nondistended, positive BS Central nervous system: CN2-12 grossly intact, strength intact Extremities: Perfused, no clubbing Skin: Normal skin turgor, no notable skin lesions seen Psychiatry: Mood normal // no visual hallucinations   Data Reviewed: I have personally reviewed following labs and imaging studies  CBC: Recent Labs  Lab 02/15/21 1448 02/16/21 0338 02/16/21 1500 02/17/21 0821 02/18/21 0248 02/19/21 0653  WBC 10.4 12.1*  --  10.7* 10.1 9.1  NEUTROABS 8.7*  --   --  8.6*  --   --   HGB 8.4* 7.3* 7.1* 8.8* 9.0* 8.4*  HCT 27.1* 23.3* 22.6* 27.2* 27.6* 25.9*  MCV 83.4 83.5  --  84.0 83.4 83.5  PLT 264 217  --  206 199 400   Basic Metabolic Panel: Recent Labs  Lab 02/15/21 1448 02/16/21 0338 02/17/21 1021 02/18/21 0248 02/19/21 0653  NA 137 138 135 133* 132*  K 3.7 3.8 3.2* 3.6 4.0  CL 104 107 104 102 101  CO2 21* 21* 23 23 24   GLUCOSE 138* 118* 105* 110* 100*  BUN 28* 23 19 16 16   CREATININE 1.03 1.11 1.14 1.12 1.09  CALCIUM 9.2 8.6* 8.4* 8.3*  8.2*  MG  --   --  1.9  --  1.7   GFR: Estimated Creatinine Clearance: 57.7 mL/min (by C-G formula based on SCr of 1.09 mg/dL). Liver Function Tests: Recent Labs  Lab 02/15/21 1448  AST 28  ALT 15  ALKPHOS 109  BILITOT 1.3*  PROT 7.5  ALBUMIN 3.1*   No results for input(s): LIPASE, AMYLASE in the last 168 hours. No results for input(s): AMMONIA in the last 168 hours. Coagulation Profile: No results for input(s): INR, PROTIME in the last 168 hours. Cardiac Enzymes: No results for input(s): CKTOTAL, CKMB, CKMBINDEX, TROPONINI in the last 168 hours. BNP (last 3 results) No results for input(s): PROBNP in the last 8760 hours. HbA1C: No results for input(s): HGBA1C in the last 72 hours. CBG: Recent Labs  Lab 02/16/21 0513 02/17/21 0826  GLUCAP 114* 95   Lipid Profile: No results for input(s): CHOL, HDL, LDLCALC, TRIG, CHOLHDL, LDLDIRECT in the last 72 hours. Thyroid Function Tests: No results for input(s): TSH, T4TOTAL, FREET4, T3FREE, THYROIDAB in the last 72 hours. Anemia Panel: No results for input(s): VITAMINB12, FOLATE, FERRITIN, TIBC, IRON, RETICCTPCT in the last 72 hours. Sepsis Labs: Recent Labs  Lab 02/15/21 1448  LATICACIDVEN 1.1    Recent  Results (from the past 240 hour(s))  Blood culture (routine x 2)     Status: None (Preliminary result)   Collection Time: 02/15/21  2:14 PM   Specimen: BLOOD RIGHT HAND  Result Value Ref Range Status   Specimen Description   Final    BLOOD RIGHT HAND Performed at [facility] [address], [place], [place] [address]    Special Requests   Final    BOTTLES DRAWN AEROBIC AND ANAEROBIC Blood Culture results may not be optimal due to an inadequate volume of blood received in culture bottles Performed at [facility] [place] [address], [place], [place] [address]    Culture   Final    NO GROWTH 4 DAYS Performed at [facility], [place] [address], [place], [place] [address]     Report Status PENDING  Incomplete  Resp Panel by RT-PCR (Flu A&B, Covid) Nasopharyngeal Swab     Status: None   Collection Time: 02/15/21  2:48 PM   Specimen: Nasopharyngeal Swab; Nasopharyngeal(NP) swabs in vial transport medium  Result Value Ref Range Status   SARS Coronavirus 2 by RT PCR NEGATIVE NEGATIVE Final    Comment: (NOTE) SARS-CoV-2 target nucleic acids are NOT DETECTED.  The SARS-CoV-2 RNA is generally detectable in upper respiratory specimens during the acute phase of infection. The lowest concentration of SARS-CoV-2 viral copies this assay can detect is 138 copies/mL. A negative result does not preclude SARS-Cov-2 infection and should not be used as the sole basis for treatment or other patient management decisions. A negative result may occur with  improper specimen collection/handling, submission of specimen other than nasopharyngeal swab, presence of viral mutation(s) within the areas targeted by this assay, and inadequate number of viral copies(<138 copies/mL). A negative result must be combined with clinical observations, patient history, and epidemiological information. The expected result is Negative.  Fact Sheet for Patients:  EntrepreneurPulse.com.au  Fact Sheet for Healthcare Providers:  IncredibleEmployment.be  This test is no t yet approved or cleared by the [place] FDA and  has been authorized for detection and/or diagnosis of SARS-CoV-2 by FDA under an Emergency Use Authorization (EUA). This EUA will remain  in effect (meaning this test can be used) for the duration of the COVID-19 declaration under Section 564(b)(1) of the Act, 21 U.S.C.section 360bbb-3(b)(1), unless the authorization is terminated  or revoked sooner.       Influenza A by PCR NEGATIVE NEGATIVE Final   Influenza B by PCR NEGATIVE NEGATIVE Final    Comment: (NOTE) The Xpert Xpress SARS-CoV-2/FLU/RSV plus assay is intended as an aid in the  diagnosis of influenza from Nasopharyngeal swab specimens and should not be used as a sole basis for treatment. Nasal washings and aspirates are unacceptable for Xpert Xpress SARS-CoV-2/FLU/RSV testing.  Fact Sheet for Patients: EntrepreneurPulse.com.au  Fact Sheet for Healthcare Providers: IncredibleEmployment.be  This test is not yet approved or cleared by the [place] FDA and has been authorized for detection and/or diagnosis of SARS-CoV-2 by FDA under an Emergency Use Authorization (EUA). This EUA will remain in effect (meaning this test can be used) for the duration of the COVID-19 declaration under Section 564(b)(1) of the Act, 21 U.S.C. section 360bbb-3(b)(1), unless the authorization is terminated or revoked.  Performed at [facility], [place] [address], [place], [place] [address]   Blood culture (routine x 2)     Status: None (Preliminary result)   Collection Time: 02/15/21  2:49 PM   Specimen:  BLOOD  Result Value Ref Range Status   Specimen Description   Final    BLOOD PORTA CATH Performed at [facility] [address], [place], [place] [address]    Special Requests   Final    BOTTLES DRAWN AEROBIC AND ANAEROBIC Blood Culture adequate volume Performed at [facility] [address] [place], [place] [address]    Culture   Final    NO GROWTH 4 DAYS Performed at [facility], [place] [address] [place], [place] [address]    Report Status PENDING  Incomplete  Urine Culture     Status: None   Collection Time: 02/16/21  2:50 PM   Specimen: Urine, Clean Catch  Result Value Ref Range Status   Specimen Description   Final    URINE, CLEAN CATCH Performed at [facility], [place] [address], [place], [place] [address]    Special Requests   Final    NONE Performed at [facility], [place] [address], [place], [place] [address]    Culture    Final    NO GROWTH Performed at [facility], [place] [address] [place], [place] [address]    Report Status 02/17/2021 FINAL  Final     Radiology Studies: No results found.  Scheduled Meds:  aspirin EC  81 mg Oral Daily   atorvastatin  80 mg Oral q1800   bisacodyl  10 mg Rectal Once   Chlorhexidine Gluconate Cloth  6 each Topical Daily   enoxaparin (LOVENOX) injection  40 mg Subcutaneous Q24H   feeding supplement  237 mL Oral TID BM   magic mouthwash w/lidocaine  15 mL Oral QID   melatonin  3 mg Oral QHS   metoprolol succinate  50 mg Oral QHS   mirtazapine  15 mg Oral QHS   multivitamin with minerals  1 tablet Oral Daily   sodium chloride flush  3 mL Intravenous Q12H   Continuous Infusions:  lactated ringers 75 mL/hr at 02/18/21 1730     LOS: 3 days   Marylu Lund, MD [facility] Pager On Amion  If 7PM-7AM, please contact night-coverage 02/19/2021, 4:38 PM

## 2021-02-19 NOTE — TOC Progression Note (Signed)
Transition of Care [facility]) - Progression Note    Patient Details  Name: NEEKO PHARO MRN: [id number] Date of Birth: 11/06/1945  Transition of Care [facility]) CM/SW Contact  Rumaisa Schnetzer, Juliann Pulse, RN Phone Number: 02/19/2021, 10:33 AM  Clinical Narrative:  Await bed choice prior auth.   1. 1.3 mi [facility] [place], [place] [address] [phone] Overall rating Above average 2. 1.6 mi [facility] [place], [place] [address] [phone] Overall rating Much below average 3. 2.1 mi [place], [place] [address] [phone] Overall rating Much below average 4. 2.5 mi [facility] [place], [place] [address] [phone] Overall rating Below average 5. 2.8 mi [facility] [place], [place] [address] [phone] Overall rating Average 6. 2.8 mi [place] [address] [place], [place] [address] [phone] Overall rating Much below average 7. 3 mi [facility] [place], [place] [address] [phone] Overall rating Above average 8. 3.6 [place] [address] [place], [place] [address] [phone] Overall rating Average 9. 3.6 mi [facility] [address], [place] [address] [phone] Overall rating Much below average 10. 3.9 mi [facility] [place], [place] [address] [phone] Overall rating Much below average 11. 4.4 mi [facility] [place], [place] [address] [phone] Overall rating Much above average 12. 4.6 mi [facility] [address] [place], [place]  [address] [phone] Overall rating Much above average 13. 5.5 mi [facility] [address] [place], [place] [address] [phone] Overall rating Above average 14. 8.2 [place], [place] [address] [phone] Overall rating Much above average 15. 9 mi [facility] [address] [place], [place] [address] [phone] Overall rating Below average <Previous  Expected Discharge Plan:  (TBD) Barriers to Discharge: Continued Medical Work up  Expected Discharge Plan and Services Expected Discharge Plan:  (TBD)   Discharge Planning Services: CM Consult   Living arrangements for the past 2 months: Single Family Home                                       Social Determinants of Health (SDOH) Interventions    Readmission Risk Interventions No flowsheet data found.

## 2021-02-20 ENCOUNTER — Telehealth: Payer: Self-pay | Admitting: Pharmacist

## 2021-02-20 LAB — CULTURE, BLOOD (ROUTINE X 2)
Culture: NO GROWTH
Culture: NO GROWTH
Special Requests: ADEQUATE

## 2021-02-20 LAB — RESP PANEL BY RT-PCR (FLU A&B, COVID) ARPGX2
Influenza A by PCR: NEGATIVE
Influenza B by PCR: NEGATIVE
SARS Coronavirus 2 by RT PCR: NEGATIVE

## 2021-02-20 LAB — GLUCOSE, CAPILLARY: Glucose-Capillary: 107 mg/dL — ABNORMAL HIGH (ref 70–99)

## 2021-02-20 MED ORDER — OXYCODONE HCL 5 MG PO TABS: 5.0000 mg | ORAL_TABLET | ORAL | 0 refills | Status: DC | PRN

## 2021-02-20 NOTE — Progress Notes (Signed)
Occupational Therapy Treatment Patient Details Name: Lawrence Hall MRN: [id number] DOB: May 03, 1945 Today's Date: 02/20/2021   History of present illness 75 year old male who presented to the hospital after a fall at home. Patient was admitted with syncope, FTT with anemia and dehydration. PRF:FMBWG IV urothelial carcinoma with hepatic and osseous mets, [name], a fib, and hyperlipidemia.   OT comments  Treatment focused on self care tasks out of bed and activity tolerance. Patient able to stand at sink to wash face, don underwear with only min guard and ambulate over 1 minute in hallway with Rw. No overt loss of balance and no complaints of dizziness. Cont POC.   Recommendations for follow up therapy are one component of a multi-disciplinary discharge planning process, led by the attending physician.  Recommendations may be updated based on patient status, additional functional criteria and insurance authorization.    Follow Up Recommendations  Skilled nursing-short term rehab (<3 hours/day)    Assistance Recommended at Discharge Frequent or constant Supervision/Assistance  Equipment Recommendations  Other (comment) (Defer to Next Venue)    Recommendations for Other Services      Precautions / Restrictions Precautions Precautions: Fall Precaution Comments: monitor BP Restrictions Weight Bearing Restrictions: No       Mobility Bed Mobility Overal bed mobility: Needs Assistance Bed Mobility: Sit to Supine       Sit to supine: Independent        Transfers Overall transfer level: Needs assistance Equipment used: Rolling walker (2 wheels) Transfers: Sit to/from Stand   Stand pivot transfers: Min guard         General transfer comment: MIn guard to ambulate in hall with RW.     Balance Overall balance assessment: Mild deficits observed, not formally tested                                         ADL either performed or assessed with clinical  judgement   ADL Overall ADL's : Needs assistance/impaired     Grooming: Min guard;Wash/dry face;Standing Grooming Details (indicate cue type and reason): stood at sink to wash face             Lower Body Dressing: Set up;Min guard;Sit to/from stand Lower Body Dressing Details (indicate cue type and reason): able to don underwear without assistance                     Vision Patient Visual Report: No change from baseline     Perception     Praxis      Cognition Arousal/Alertness: Awake/alert Behavior During Therapy: WFL for tasks assessed/performed Overall Cognitive Status: Within Functional Limits for tasks assessed                                            Exercises     Shoulder Instructions       General Comments      Pertinent Vitals/ Pain       Pain Assessment: No/denies pain  Home Living                                          Prior  Functioning/Environment              Frequency  Min 2X/week        Progress Toward Goals  OT Goals(current goals can now be found in the care plan section)  Progress towards OT goals: Progressing toward goals  Acute Rehab OT Goals Patient Stated Goal: to return to independence (yard work and driving ) OT Goal Formulation: With patient Time For Goal Achievement: 03/03/21 Potential to Achieve Goals: Good  Plan Discharge plan remains appropriate    Co-evaluation                 AM-PAC OT "6 Clicks" Daily Activity     Outcome Measure   Help from another person eating meals?: None Help from another person taking care of personal grooming?: A Little Help from another person toileting, which includes using toliet, bedpan, or urinal?: A Little Help from another person bathing (including washing, rinsing, drying)?: A Little Help from another person to put on and taking off regular upper body clothing?: A Little Help from another person to put on and taking  off regular lower body clothing?: A Little 6 Click Score: 19    End of Session Equipment Utilized During Treatment: Rolling walker (2 wheels)  OT Visit Diagnosis: Unsteadiness on feet (R26.81);Muscle weakness (generalized) (M62.81)   Activity Tolerance Patient tolerated treatment well   Patient Left in chair;with chair alarm set   Nurse Communication Other (comment) (okay to see per RN)        Time: 1243-1300 OT Time Calculation (min): 17 min  Charges: OT General Charges $OT Visit: 1 Visit OT Treatments $Self Care/Home Management : 8-22 mins  Derl Barrow, OTR/L [place]  Office [phone] Pager: [place] 02/20/2021, 2:05 PM

## 2021-02-20 NOTE — TOC Progression Note (Signed)
Transition of Care [facility]) - Progression Note    Patient Details  Name: Lawrence Hall MRN: [id number] Date of Birth: [birth year]  Transition of Care [facility]) CM/SW Contact  Tasneem Cormier, Juliann Pulse, RN Phone Number: 02/20/2021, 10:03 AM  Clinical Narrative:   [facility] [address] [facility] following can accept today if d/c summary by 3p.auth initiated w/HTA rep [facility] [place] following.Family to transport by w/c.     Expected Discharge Plan: Skilled Nursing Facility Barriers to Discharge: Insurance Authorization  Expected Discharge Plan and Services Expected Discharge Plan: [place]   Discharge Planning Services: CM Consult   Living arrangements for the past 2 months: Single Family Home                                       Social Determinants of Health (SDOH) Interventions    Readmission Risk Interventions No flowsheet data found.

## 2021-02-20 NOTE — Discharge Summary (Addendum)
Physician Discharge Summary  Lawrence Hall GYK:[id number] DOB: [birth year] DOA: 02/15/2021  PCP: [name], MD  Admit date: 02/15/2021 Discharge date: 02/22/2021  Admitted From: Home Disposition:  SNF  Recommendations for Outpatient Follow-up:  Follow up with PCP in 1-2 weeks  Discharge Condition:Stable CODE STATUS:Full Diet recommendation: Regular   Brief/Interim Summary: 75 year old gentleman history of stage IV urothelial carcinoma with hepatic and osseous mets status post radical resection to right humerus recently started on salvage chemotherapy, history of CAD status post CABG, moderate AAS status post AVR, postop A. fib without recurrence, first-degree AV block, LBBB, anemia of chronic disease, hypertension, hyperlipidemia presenting to the ED after syncopal episode around 4 AM on the day of admission.  Patient noted to have started recent palliative chemotherapy 02/12/2021 received hydration pre and posttreatment did well on the first day however since then has had fatigue, generalized weakness, nausea vomiting, inability to maintain any adequate oral intake with lightheadedness resulting in a syncopal episode.  On presentation to the ED patient noted to have a blood pressure 100/70, pulse of 118, sats of 99% on room air.  Cysts COVID-19 PCR negative.  Chest x-ray negative for any acute infiltrates.  CTA chest negative for PE.  Patient placed on IV Reglan, IV fluids, supportive care    Discharge Diagnoses:  Principal Problem:   Syncope Active Problems:   Essential hypertension   CAD (coronary artery disease), native coronary artery   Malignant tumor of renal pelvis, left (HCC)   Anemia   Nausea and vomiting   Protein-calorie malnutrition, severe   Hypokalemia   Weakness  1 syncope -Likely secondary to hypovolemia secondary to dehydration/orthostasis in the setting of ongoing nausea vomiting decreased oral intake after recent chemotherapy. -Pt improved with  IVF -Successfully advanced to regular diet -Continued antiemetics, supportive care .   2.  Dehydration -IV fluids were given as tolerated  3.  Anemia of chronic disease -Likely secondary to recent chemotherapy. -Patient with no overt bleeding. -Hemoglobin noted at 7.1 on 02/16/2021, status post transfusion 2 units packed red blood cells hemoglobin remained stable  4.  Nausea/vomiting/decreased appetite -Patient with progressive failure to thrive related to disease progression of cancer, acutely worsened in the setting of chemotherapy. -Improved with antiemetics clinical improvement. -Patient was started on clears diet advanced to a regular diet, reported eating "like a horse" at time of d/c  5.  Stage IV urothelial carcinoma with hepatic and osseous mets -Patient being followed by oncologist Dr. Alen Blew who has been notified via epic of patient's admission. -Patient noted to have been started on Padcev 10/26. -Per oncology's most recent office note patient likely approaching end-stage status and may need hospice. -Patient seen by oncology and patient noted to be on Palliative chemo and felt to be a marginal candidate for any additional treatment  -Appreciate input by Palliative Care. Plan d/c with home hospice. Pt reports improvement with steroids, will prescribe on d/c  6.  Hypertension -Continue Toprol-XL as tolerated  7.  Coronary artery disease status post CABG/moderate AS status post AVR/postop A. fib without recurrence/hyperlipidemia -Stable. -Continue statin, aspirin, Toprol-XL.  8.  Hypokalemia -Likely secondary to GI losses. -Corrected  9.  Severe protein calorie malnutrition -Continue nutritional supplementation.     Discharge Instructions   Allergies as of 02/22/2021       Reactions   Other Itching, Other (See Comments)   Pet dander, seasonal allergies = Runny nose, itchy eyes        Medication List  STOP taking these medications    HYDROmorphone 2  MG tablet Commonly known as: DILAUDID   prochlorperazine 10 MG tablet Commonly known as: COMPAZINE       TAKE these medications    aspirin EC 81 MG tablet Take 1 tablet (81 mg total) by mouth daily. Swallow whole. What changed: when to take this   atorvastatin 80 MG tablet Commonly known as: LIPITOR TAKE 1 TABLET ONCE DAILY AT 6 PM. What changed: See the new instructions.   dexamethasone 4 MG tablet Commonly known as: DECADRON Take 1 tablet (4 mg total) by mouth daily.   metoprolol succinate 50 MG 24 hr tablet Commonly known as: TOPROL-XL Take 1 tablet (50 mg total) by mouth at bedtime.   Milk Thistle 1000 MG Caps Take 1,000 mg by mouth every other day.   mirtazapine 15 MG tablet Commonly known as: REMERON Take 1 tablet (15 mg total) by mouth at bedtime.   nitroGLYCERIN 0.4 MG SL tablet Commonly known as: NITROSTAT 1 TAB UNDER TONGUE AS NEEDED FOR CHEST PAIN. MAY REPEAT EVERY 5 MIN FOR A TOTAL OF 3 DOSES. What changed: See the new instructions.   oxyCODONE 5 MG immediate release tablet Commonly known as: Oxy IR/ROXICODONE Take 1 tablet (5 mg total) by mouth every 4 (four) hours as needed for breakthrough pain or moderate pain.   polyethylene glycol 17 g packet Commonly known as: MIRALAX / GLYCOLAX Take 17 g by mouth daily. What changed:  when to take this reasons to take this   Vitamin D 50 MCG (2000 UT) tablet Take 2,000 Units by mouth daily with lunch.        Follow-up Information     [name], MD Follow up in 2 week(s).   Specialty: Family Medicine Why: Hospital follow up Contact information: [place] [address] [phone]         Sanda Klein, MD .   Specialty: Cardiology Contact information: [address] [place] [address] [phone]         Sanda Klein, MD .   Specialty: Cardiology Contact information: [address] [place]  [address] [phone]         Wyatt Portela, MD Follow up.   Specialty: Oncology Why: as scheduled Contact information: [place] [address] [phone]         [facility] Follow up.   Specialty: Hospice and Palliative Medicine Why: Home Hospice services Contact information: [place] [address] [phone]               Allergies  Allergen Reactions   Other Itching and Other (See Comments)    Pet dander, seasonal allergies = Runny nose, itchy eyes    Consultations: Oncology Palliative Care  Procedures/Studies: CT Angio Chest PE W and/or Wo Contrast  Result Date: 02/15/2021 CLINICAL DATA:  75 year old male with acute chest pain and syncope. EXAM: CT ANGIOGRAPHY CHEST WITH CONTRAST TECHNIQUE: Multidetector CT imaging of the chest was performed using the standard protocol during bolus administration of intravenous contrast. Multiplanar CT image reconstructions and MIPs were obtained to evaluate the vascular anatomy. CONTRAST:  22mL OMNIPAQUE IOHEXOL 350 MG/ML SOLN COMPARISON:  09/03/2020 and prior CTs FINDINGS: Cardiovascular: This is a technically adequate study but respiratory motion artifact in the LOWER lungs slightly decreases sensitivity. No pulmonary emboli are identified. CABG aortic valve replacement noted. Thoracic aortic atherosclerotic calcifications are noted without aneurysm. No pericardial effusion.  A RIGHT IJ Port-A-Cath is noted with tip at the SUPERIOR cavoatrial junction. Mediastinum/Nodes: No enlarged mediastinal, hilar, or axillary lymph nodes. Thyroid gland, trachea, and esophagus demonstrate no significant findings. Lungs/Pleura: Lungs are clear. No pleural effusion or pneumothorax. Upper Abdomen: No acute abnormality. Musculoskeletal: No acute or suspicious bony abnormalities are noted. Surgical hardware within the proximal RIGHT humerus and median sternotomy wires are present. Review  of the MIP images confirms the above findings. IMPRESSION: No evidence of acute abnormality. No evidence of pulmonary emboli. Aortic Atherosclerosis (ICD10-I70.0). Electronically Signed   By: Margarette Canada M.D.   On: 02/15/2021 16:58   DG Chest Portable 1 View  Result Date: 02/15/2021 CLINICAL DATA:  Syncopal episode. EXAM: PORTABLE CHEST 1 VIEW COMPARISON:  Chest CT 09/03/2020 FINDINGS: The cardiac silhouette, mediastinal and hilar contours are within normal limits. Stable surgical changes from bypass surgery. Right IJ power port is in good position without complicating features. No acute pulmonary findings. No pulmonary lesions or pleural effusions. The bony thorax is intact. IMPRESSION: No acute cardiopulmonary findings. Electronically Signed   By: Marijo Sanes M.D.   On: 02/15/2021 14:56    Subjective: Eager to go home  Discharge Exam: Vitals:   02/22/21 0450 02/22/21 1407  BP: 127/76 119/81  Pulse: 79 67  Resp:  16  Temp: 97.6 F (36.4 C) 97.7 F (36.5 C)  SpO2: 95% 97%   Vitals:   02/21/21 2100 02/21/21 2303 02/22/21 0450 02/22/21 1407  BP: 117/72  127/76 119/81  Pulse: 87  79 67  Resp: 17   16  Temp: 98.2 F (36.8 C)  97.6 F (36.4 C) 97.7 F (36.5 C)  TempSrc: Oral  Oral Oral  SpO2: 97%  95% 97%  Weight:  72.1 kg 73 kg   Height:        General: Pt is alert, awake, not in acute distress Cardiovascular: RRR, S1/S2 + Respiratory: CTA bilaterally, no wheezing, no rhonchi Abdominal: Soft, NT, ND, bowel sounds + Extremities: no edema, no cyanosis   The results of significant diagnostics from this hospitalization (including imaging, microbiology, ancillary and laboratory) are listed below for reference.     Microbiology: Recent Results (from the past 240 hour(s))  Blood culture (routine x 2)     Status: None   Collection Time: 02/15/21  2:14 PM   Specimen: BLOOD RIGHT HAND  Result Value Ref Range Status   Specimen Description   Final    BLOOD RIGHT HAND Performed  at [facility], [place] [address], [place], [place] [address]    Special Requests   Final    BOTTLES DRAWN AEROBIC AND ANAEROBIC Blood Culture results may not be optimal due to an inadequate volume of blood received in culture bottles Performed at [place] [address] [place], [place] [address]    Culture   Final    NO GROWTH 5 DAYS Performed at [facility], [place] [address] [place], [place] [address]    Report Status 02/20/2021 FINAL  Final  Resp Panel by RT-PCR (Flu A&B, Covid) Nasopharyngeal Swab     Status: None   Collection Time: 02/15/21  2:48 PM   Specimen: Nasopharyngeal Swab; Nasopharyngeal(NP) swabs in vial transport medium  Result Value Ref Range Status   SARS Coronavirus 2 by RT PCR NEGATIVE NEGATIVE Final    Comment: (NOTE) SARS-CoV-2 target nucleic acids are NOT DETECTED.  The SARS-CoV-2 RNA is generally detectable in upper respiratory specimens during the acute phase of  infection. The lowest concentration of SARS-CoV-2 viral copies this assay can detect is 138 copies/mL. A negative result does not preclude SARS-Cov-2 infection and should not be used as the sole basis for treatment or other patient management decisions. A negative result may occur with  improper specimen collection/handling, submission of specimen other than nasopharyngeal swab, presence of viral mutation(s) within the areas targeted by this assay, and inadequate number of viral copies(<138 copies/mL). A negative result must be combined with clinical observations, patient history, and epidemiological information. The expected result is Negative.  Fact Sheet for Patients:  EntrepreneurPulse.com.au  Fact Sheet for Healthcare Providers:  IncredibleEmployment.be  This test is no t yet approved or cleared by the [place] FDA and  has been authorized for detection and/or diagnosis of SARS-CoV-2 by FDA under an  Emergency Use Authorization (EUA). This EUA will remain  in effect (meaning this test can be used) for the duration of the COVID-19 declaration under Section 564(b)(1) of the Act, 21 U.S.C.section 360bbb-3(b)(1), unless the authorization is terminated  or revoked sooner.       Influenza A by PCR NEGATIVE NEGATIVE Final   Influenza B by PCR NEGATIVE NEGATIVE Final    Comment: (NOTE) The Xpert Xpress SARS-CoV-2/FLU/RSV plus assay is intended as an aid in the diagnosis of influenza from Nasopharyngeal swab specimens and should not be used as a sole basis for treatment. Nasal washings and aspirates are unacceptable for Xpert Xpress SARS-CoV-2/FLU/RSV testing.  Fact Sheet for Patients: EntrepreneurPulse.com.au  Fact Sheet for Healthcare Providers: IncredibleEmployment.be  This test is not yet approved or cleared by the [place] FDA and has been authorized for detection and/or diagnosis of SARS-CoV-2 by FDA under an Emergency Use Authorization (EUA). This EUA will remain in effect (meaning this test can be used) for the duration of the COVID-19 declaration under Section 564(b)(1) of the Act, 21 U.S.C. section 360bbb-3(b)(1), unless the authorization is terminated or revoked.  Performed at [facility], [place] [address] [place], [place] [address]   Blood culture (routine x 2)     Status: None   Collection Time: 02/15/21  2:49 PM   Specimen: BLOOD  Result Value Ref Range Status   Specimen Description   Final    BLOOD PORTA CATH Performed at [place] [address] [place], [place] [address]    Special Requests   Final    BOTTLES DRAWN AEROBIC AND ANAEROBIC Blood Culture adequate volume Performed at [place] [address] [place], [place] [address]    Culture   Final    NO GROWTH 5 DAYS Performed at [facility], [place] [address] [place], [place] [address]     Report Status 02/20/2021 FINAL  Final  Urine Culture     Status: None   Collection Time: 02/16/21  2:50 PM   Specimen: Urine, Clean Catch  Result Value Ref Range Status   Specimen Description   Final    URINE, CLEAN CATCH Performed at [facility], [place] [address], [place], [place] [address]    Special Requests   Final    NONE Performed at [facility], [place] [address] [place] [place], [place] [address]    Culture   Final    NO GROWTH Performed at [facility], [place] [address] [place], [place] [address]    Report Status 02/17/2021 FINAL  Final  Resp Panel by RT-PCR (Flu A&B, Covid) Nasopharyngeal Swab  Status: None   Collection Time: 02/20/21  1:17 PM   Specimen: Nasopharyngeal Swab; Nasopharyngeal(NP) swabs in vial transport medium  Result Value Ref Range Status   SARS Coronavirus 2 by RT PCR NEGATIVE NEGATIVE Final    Comment: (NOTE) SARS-CoV-2 target nucleic acids are NOT DETECTED.  The SARS-CoV-2 RNA is generally detectable in upper respiratory specimens during the acute phase of infection. The lowest concentration of SARS-CoV-2 viral copies this assay can detect is 138 copies/mL. A negative result does not preclude SARS-Cov-2 infection and should not be used as the sole basis for treatment or other patient management decisions. A negative result may occur with  improper specimen collection/handling, submission of specimen other than nasopharyngeal swab, presence of viral mutation(s) within the areas targeted by this assay, and inadequate number of viral copies(<138 copies/mL). A negative result must be combined with clinical observations, patient history, and epidemiological information. The expected result is Negative.  Fact Sheet for Patients:  EntrepreneurPulse.com.au  Fact Sheet for Healthcare Providers:  IncredibleEmployment.be  This test is no t yet approved or cleared by the [place]  FDA and  has been authorized for detection and/or diagnosis of SARS-CoV-2 by FDA under an Emergency Use Authorization (EUA). This EUA will remain  in effect (meaning this test can be used) for the duration of the COVID-19 declaration under Section 564(b)(1) of the Act, 21 U.S.C.section 360bbb-3(b)(1), unless the authorization is terminated  or revoked sooner.       Influenza A by PCR NEGATIVE NEGATIVE Final   Influenza B by PCR NEGATIVE NEGATIVE Final    Comment: (NOTE) The Xpert Xpress SARS-CoV-2/FLU/RSV plus assay is intended as an aid in the diagnosis of influenza from Nasopharyngeal swab specimens and should not be used as a sole basis for treatment. Nasal washings and aspirates are unacceptable for Xpert Xpress SARS-CoV-2/FLU/RSV testing.  Fact Sheet for Patients: EntrepreneurPulse.com.au  Fact Sheet for Healthcare Providers: IncredibleEmployment.be  This test is not yet approved or cleared by the [place] FDA and has been authorized for detection and/or diagnosis of SARS-CoV-2 by FDA under an Emergency Use Authorization (EUA). This EUA will remain in effect (meaning this test can be used) for the duration of the COVID-19 declaration under Section 564(b)(1) of the Act, 21 U.S.C. section 360bbb-3(b)(1), unless the authorization is terminated or revoked.  Performed at [facility], [place] [address] [place], [place] [address]      Labs: BNP (last 3 results) Recent Labs    02/15/21 1448  BNP 00.7   Basic Metabolic Panel: Recent Labs  Lab 02/16/21 0338 02/17/21 1021 02/18/21 0248 02/19/21 0653  NA 138 135 133* 132*  K 3.8 3.2* 3.6 4.0  CL 107 104 102 101  CO2 21* 23 23 24   GLUCOSE 118* 105* 110* 100*  BUN 23 19 16 16   CREATININE 1.11 1.14 1.12 1.09  CALCIUM 8.6* 8.4* 8.3* 8.2*  MG  --  1.9  --  1.7   Liver Function Tests: No results for input(s): AST, ALT, ALKPHOS, BILITOT, PROT, ALBUMIN in the  last 168 hours.  No results for input(s): LIPASE, AMYLASE in the last 168 hours. No results for input(s): AMMONIA in the last 168 hours. CBC: Recent Labs  Lab 02/16/21 0338 02/16/21 1500 02/17/21 0821 02/18/21 0248 02/19/21 0653  WBC 12.1*  --  10.7* 10.1 9.1  NEUTROABS  --   --  8.6*  --   --   HGB 7.3* 7.1* 8.8* 9.0* 8.4*  HCT 23.3* 22.6* 27.2*  27.6* 25.9*  MCV 83.5  --  84.0 83.4 83.5  PLT 217  --  206 199 179   Cardiac Enzymes: No results for input(s): CKTOTAL, CKMB, CKMBINDEX, TROPONINI in the last 168 hours. BNP: Invalid input(s): POCBNP CBG: Recent Labs  Lab 02/16/21 0513 02/17/21 0826 02/20/21 0526 02/21/21 0425 02/22/21 0614  GLUCAP 114* 95 107* 97 184*   D-Dimer No results for input(s): DDIMER in the last 72 hours. Hgb A1c No results for input(s): HGBA1C in the last 72 hours. Lipid Profile No results for input(s): CHOL, HDL, LDLCALC, TRIG, CHOLHDL, LDLDIRECT in the last 72 hours. Thyroid function studies No results for input(s): TSH, T4TOTAL, T3FREE, THYROIDAB in the last 72 hours.  Invalid input(s): FREET3 Anemia work up No results for input(s): VITAMINB12, FOLATE, FERRITIN, TIBC, IRON, RETICCTPCT in the last 72 hours. Urinalysis    Component Value Date/Time   COLORURINE YELLOW 02/16/2021 1450   APPEARANCEUR CLEAR 02/16/2021 1450   LABSPEC 1.029 02/16/2021 1450   PHURINE 5.0 02/16/2021 1450   GLUCOSEU NEGATIVE 02/16/2021 1450   HGBUR NEGATIVE 02/16/2021 1450   BILIRUBINUR NEGATIVE 02/16/2021 1450   KETONESUR NEGATIVE 02/16/2021 1450   PROTEINUR NEGATIVE 02/16/2021 1450   NITRITE NEGATIVE 02/16/2021 1450   LEUKOCYTESUR NEGATIVE 02/16/2021 1450   Sepsis Labs Invalid input(s): PROCALCITONIN,  WBC,  LACTICIDVEN Microbiology Recent Results (from the past 240 hour(s))  Blood culture (routine x 2)     Status: None   Collection Time: 02/15/21  2:14 PM   Specimen: BLOOD RIGHT HAND  Result Value Ref Range Status   Specimen Description   Final     BLOOD RIGHT HAND Performed at [facility], [place] [address] [place], [place] [address]    Special Requests   Final    BOTTLES DRAWN AEROBIC AND ANAEROBIC Blood Culture results may not be optimal due to an inadequate volume of blood received in culture bottles Performed at [place] [address] [place], [place] [address]    Culture   Final    NO GROWTH 5 DAYS Performed at [facility], [place] [address] [place], [place] [address]    Report Status 02/20/2021 FINAL  Final  Resp Panel by RT-PCR (Flu A&B, Covid) Nasopharyngeal Swab     Status: None   Collection Time: 02/15/21  2:48 PM   Specimen: Nasopharyngeal Swab; Nasopharyngeal(NP) swabs in vial transport medium  Result Value Ref Range Status   SARS Coronavirus 2 by RT PCR NEGATIVE NEGATIVE Final    Comment: (NOTE) SARS-CoV-2 target nucleic acids are NOT DETECTED.  The SARS-CoV-2 RNA is generally detectable in upper respiratory specimens during the acute phase of infection. The lowest concentration of SARS-CoV-2 viral copies this assay can detect is 138 copies/mL. A negative result does not preclude SARS-Cov-2 infection and should not be used as the sole basis for treatment or other patient management decisions. A negative result may occur with  improper specimen collection/handling, submission of specimen other than nasopharyngeal swab, presence of viral mutation(s) within the areas targeted by this assay, and inadequate number of viral copies(<138 copies/mL). A negative result must be combined with clinical observations, patient history, and epidemiological information. The expected result is Negative.  Fact Sheet for Patients:  EntrepreneurPulse.com.au  Fact Sheet for Healthcare Providers:  IncredibleEmployment.be  This test is no t yet approved or cleared by the [place] FDA and  has been authorized for detection and/or diagnosis of  SARS-CoV-2 by FDA under an Emergency Use Authorization (EUA).  This EUA will remain  in effect (meaning this test can be used) for the duration of the COVID-19 declaration under Section 564(b)(1) of the Act, 21 U.S.C.section 360bbb-3(b)(1), unless the authorization is terminated  or revoked sooner.       Influenza A by PCR NEGATIVE NEGATIVE Final   Influenza B by PCR NEGATIVE NEGATIVE Final    Comment: (NOTE) The Xpert Xpress SARS-CoV-2/FLU/RSV plus assay is intended as an aid in the diagnosis of influenza from Nasopharyngeal swab specimens and should not be used as a sole basis for treatment. Nasal washings and aspirates are unacceptable for Xpert Xpress SARS-CoV-2/FLU/RSV testing.  Fact Sheet for Patients: EntrepreneurPulse.com.au  Fact Sheet for Healthcare Providers: IncredibleEmployment.be  This test is not yet approved or cleared by the [place] FDA and has been authorized for detection and/or diagnosis of SARS-CoV-2 by FDA under an Emergency Use Authorization (EUA). This EUA will remain in effect (meaning this test can be used) for the duration of the COVID-19 declaration under Section 564(b)(1) of the Act, 21 U.S.C. section 360bbb-3(b)(1), unless the authorization is terminated or revoked.  Performed at [facility], [place] [address], [place], [place] [address]   Blood culture (routine x 2)     Status: None   Collection Time: 02/15/21  2:49 PM   Specimen: BLOOD  Result Value Ref Range Status   Specimen Description   Final    BLOOD PORTA CATH Performed at [place] [address] [place], [place] [address]    Special Requests   Final    BOTTLES DRAWN AEROBIC AND ANAEROBIC Blood Culture adequate volume Performed at [place] [address] [place], [place] [address]    Culture   Final    NO GROWTH 5 DAYS Performed at [facility], [place] [address],  [place], [place] [address]    Report Status 02/20/2021 FINAL  Final  Urine Culture     Status: None   Collection Time: 02/16/21  2:50 PM   Specimen: Urine, Clean Catch  Result Value Ref Range Status   Specimen Description   Final    URINE, CLEAN CATCH Performed at [facility], [place] [address] [place], [place] [address]    Special Requests   Final    NONE Performed at [facility], [place] [address], [place], [place] [address]    Culture   Final    NO GROWTH Performed at [facility], [place] [address], [place],  [address]    Report Status 02/17/2021 FINAL  Final  Resp Panel by RT-PCR (Flu A&B, Covid) Nasopharyngeal Swab     Status: None   Collection Time: 02/20/21  1:17 PM   Specimen: Nasopharyngeal Swab; Nasopharyngeal(NP) swabs in vial transport medium  Result Value Ref Range Status   SARS Coronavirus 2 by RT PCR NEGATIVE NEGATIVE Final    Comment: (NOTE) SARS-CoV-2 target nucleic acids are NOT DETECTED.  The SARS-CoV-2 RNA is generally detectable in upper respiratory specimens during the acute phase of infection. The lowest concentration of SARS-CoV-2 viral copies this assay can detect is 138 copies/mL. A negative result does not preclude SARS-Cov-2 infection and should not be used as the sole basis for treatment or other patient management decisions. A negative result may occur with  improper specimen collection/handling, submission of specimen other than nasopharyngeal swab, presence of viral mutation(s) within the areas targeted by this assay, and inadequate number of viral copies(<138 copies/mL). A negative result  must be combined with clinical observations, patient history, and epidemiological information. The expected result is Negative.  Fact Sheet for Patients:  EntrepreneurPulse.com.au  Fact Sheet for Healthcare Providers:  IncredibleEmployment.be  This test is no t yet approved or  cleared by the [place] FDA and  has been authorized for detection and/or diagnosis of SARS-CoV-2 by FDA under an Emergency Use Authorization (EUA). This EUA will remain  in effect (meaning this test can be used) for the duration of the COVID-19 declaration under Section 564(b)(1) of the Act, 21 U.S.C.section 360bbb-3(b)(1), unless the authorization is terminated  or revoked sooner.       Influenza A by PCR NEGATIVE NEGATIVE Final   Influenza B by PCR NEGATIVE NEGATIVE Final    Comment: (NOTE) The Xpert Xpress SARS-CoV-2/FLU/RSV plus assay is intended as an aid in the diagnosis of influenza from Nasopharyngeal swab specimens and should not be used as a sole basis for treatment. Nasal washings and aspirates are unacceptable for Xpert Xpress SARS-CoV-2/FLU/RSV testing.  Fact Sheet for Patients: EntrepreneurPulse.com.au  Fact Sheet for Healthcare Providers: IncredibleEmployment.be  This test is not yet approved or cleared by the [place] FDA and has been authorized for detection and/or diagnosis of SARS-CoV-2 by FDA under an Emergency Use Authorization (EUA). This EUA will remain in effect (meaning this test can be used) for the duration of the COVID-19 declaration under Section 564(b)(1) of the Act, 21 U.S.C. section 360bbb-3(b)(1), unless the authorization is terminated or revoked.  Performed at [facility], [place] [address], [place], [place] [address]    Time spent: 30 min  SIGNED:   Marylu Lund, MD  [facility] 02/22/2021, 6:03 PM  If 7PM-7AM, please contact night-coverage

## 2021-02-20 NOTE — Chronic Care Management (AMB) (Deleted)
Chronic Care Management Pharmacy Assistant   Name: Lawrence Hall  MRN: [id number] DOB: 02-May-1945  Reason for Encounter: Disease State / Hypertension and Hyperlipidemia Assessment Call   Conditions to be addressed/monitored: HTN and HLD  Primary concerns for visit include: ***   Recent office visits:  ***  Recent consult visits:  [facility] visits:  Medication Reconciliation was completed by comparing discharge summary, patient's EMR and Pharmacy list, and upon discussion with patient.  Admitted to the hospital on *** due to ***. Discharge date was ***. Discharged from *** [place]?Medications Started at [facility] Discharge:?? -started *** due to ***  Medication Changes at Hospital Discharge: -Changed ***  Medications Discontinued at Hospital Discharge: -Stopped *** due to ***  Medications that remain the same after Hospital Discharge:??  -All other medications will remain the same.    Medications: Facility-Administered Encounter Medications as of 02/20/2021  Medication   acetaminophen (TYLENOL) tablet 650 mg   Or   acetaminophen (TYLENOL) suppository 650 mg   aspirin EC tablet 81 mg   atorvastatin (LIPITOR) tablet 80 mg   bisacodyl (DULCOLAX) suppository 10 mg   Chlorhexidine Gluconate Cloth 2 % PADS 6 each   enoxaparin (LOVENOX) injection 40 mg   feeding supplement (ENSURE ENLIVE / ENSURE PLUS) liquid 237 mL   lactated ringers infusion   lip balm (CARMEX) ointment   magic mouthwash w/lidocaine   melatonin tablet 3 mg   metoCLOPramide (REGLAN) injection 5 mg   metoprolol succinate (TOPROL-XL) 24 hr tablet 50 mg   mirtazapine (REMERON) tablet 15 mg   multivitamin with minerals tablet 1 tablet   ondansetron (ZOFRAN) tablet 4 mg   Or   ondansetron (ZOFRAN) injection 4 mg   oxyCODONE (Oxy IR/ROXICODONE) immediate release tablet 5 mg   senna-docusate (Senokot-S) tablet 1 tablet   sodium chloride flush (NS) 0.9 % injection 10-40 mL   sodium  chloride flush (NS) 0.9 % injection 3 mL   Outpatient Encounter Medications as of 02/20/2021  Medication Sig   aspirin EC 81 MG tablet Take 1 tablet (81 mg total) by mouth daily. Swallow whole. (Patient taking differently: Take 81 mg by mouth at bedtime. Swallow whole.)   atorvastatin (LIPITOR) 80 MG tablet TAKE 1 TABLET ONCE DAILY AT 6 PM. (Patient taking differently: Take 80 mg by mouth daily at 6 PM.)   Cholecalciferol (VITAMIN D) 50 MCG (2000 UT) tablet Take 2,000 Units by mouth daily with lunch.   HYDROmorphone (DILAUDID) 2 MG tablet Take 0.5 tablets (1 mg total) by mouth every 6 (six) hours as needed for severe pain.   metoprolol succinate (TOPROL-XL) 50 MG 24 hr tablet Take 1 tablet (50 mg total) by mouth at bedtime.   Milk Thistle 1000 MG CAPS Take 1,000 mg by mouth every other day.   mirtazapine (REMERON) 15 MG tablet Take 1 tablet (15 mg total) by mouth at bedtime.   nitroGLYCERIN (NITROSTAT) 0.4 MG SL tablet 1 TAB UNDER TONGUE AS NEEDED FOR CHEST PAIN. MAY REPEAT EVERY 5 MIN FOR A TOTAL OF 3 DOSES. (Patient taking differently: Place 0.4 mg under the tongue every 5 (five) minutes as needed for chest pain.)   oxyCODONE (OXY IR/ROXICODONE) 5 MG immediate release tablet Take 1 tablet (5 mg total) by mouth every 4 (four) hours as needed for breakthrough pain or moderate pain.   polyethylene glycol (MIRALAX / GLYCOLAX) 17 g packet Take 17 g by mouth daily. (Patient taking differently: Take 17 g by mouth  daily as needed (constipation).)   prochlorperazine (COMPAZINE) 10 MG tablet TAKE 1 TABLET EVERY 6 HOURS AS NEEDED FOR NAUSEA & VOMITING. (Patient taking differently: Take 10 mg by mouth every 6 (six) hours as needed for nausea or vomiting.)   SCHEDULE FOLLOW UP April > Fill History:   Care Gaps: AWV - message sent to Ramond Craver to schedule Flu vaccine - due  Star Rating Drugs: Atorvastatin 80mg  - last filled 02/06/2021 90DS at [place] Pharmacist  Assistant [phone]

## 2021-02-20 NOTE — Progress Notes (Signed)
error 

## 2021-02-21 LAB — GLUCOSE, CAPILLARY: Glucose-Capillary: 97 mg/dL (ref 70–99)

## 2021-02-21 MED ORDER — DEXAMETHASONE SODIUM PHOSPHATE 10 MG/ML IJ SOLN
8.0000 mg | INTRAMUSCULAR | Status: DC
  Administered 2021-02-21: 8 mg via INTRAVENOUS
  Filled 2021-02-21 (×2): qty 1

## 2021-02-21 MED ORDER — ALBUTEROL SULFATE (2.5 MG/3ML) 0.083% IN NEBU: 3.0000 mL | INHALATION_SOLUTION | RESPIRATORY_TRACT | Status: DC | PRN

## 2021-02-21 MED ORDER — LIDOCAINE 5 % EX PTCH
1.0000 | MEDICATED_PATCH | CUTANEOUS | Status: DC
  Administered 2021-02-21: 1 via TRANSDERMAL
  Filled 2021-02-21 (×2): qty 1

## 2021-02-21 NOTE — Consult Note (Addendum)
Palliative Care Consultation Note  75 yo man with advanced metastatic urothelial cancer who has disease progression despite multiple courses of chemotherapy. He has been declining over the past few weeks in terms of his functional status, worsening pain in his back, poor appetite and nausea. On his last OP visit with Dr. Alen Blew the option of hospice care was introduced and he is not felt to be a good candidate for further chemotherapy.   We discussed the strong possibility that further chemo could actually make his life shorter and that some people live better and even longer when they shift to a hospice/palliative care approach given the point at which he is in his illness.   Lawrence Hall openly discusses that he has not shared this news or his prognosis with his wife and is worried about upsetting her. His wife arrived at bedside during our conversation and I was able to deliver this news and also determine what her questions and concerns were.   Lawrence Hall has a good understanding now of his current disease state and asked excellent questions about the care options. He explains to me that his wife is very concerned about the condition of their home-which he has neglected due to his health problems over the years and his wife has been very hesitant to let people come in to their home. He does want to be at home and we discussed how he would like to spend the time he has left.  I provided education to his wife about anticipated disease trajectory and clarified her understand of what a SNF/Rehab facility option would look like for him. Lawrence Hall explained to his wife that he doesn't want to be in a facility and does not want to use the energy he has to attempt rehab. His wife believed rehab would help him rest and rejuvenate.  At this time he is able to get OOB and ambulate independently and safely- he is weak and is having pain in his lumbar spine -limiting him in endurance. He is also cognitively intact  and making sound decisions about his care.  At this time we have agreed on the following:  Will start decadron to stimulate his appetite and help with his energy level and pain. Will give him an IV dose now and transition to oral at discharge. Will titrate to lowest effective dose. Focus on pain control. He has received almost no pain medication, but endorses pain during my visit-mostly in his back. PRN oxycodone-may need low dose scheduled medication. SNF rehab issues-he does not appear to be a candidate for SNF rehab- he is likely at his baseline physical function. We discussed HTA  decision and the reasons why he wasn't a candidate for rehab.   He agrees that hospice and palliative care is the right approach for him and he is able articulate to his wife a good summary of what I explained to him about this shift in care. Discharge with home hospice services and maximize his symptom management. Provide caregiver reassurance and support to his wife.  He is hopeful that being off chemotherapy and with better symptom control he can feel better - he would like to get his affairs in order and try to make best of his remaining time.  Lane Hacker, DO Palliative Medicine  Time:70 min Greater than 50%  of this time was spent counseling and coordinating care related to the above assessment and plan.

## 2021-02-21 NOTE — Progress Notes (Signed)
Physical Therapy Treatment Patient Details Name: Lawrence Hall MRN: [id number] DOB: [birth year] Today's Date: 02/21/2021   History of Present Illness 75 year old male who presented to the hospital after a fall at home. Patient was admitted with syncope, FTT with anemia and dehydration. [name], [name], a fib, and hyperlipidemia.    PT Comments    General Comments: AxO x 3 very sweet Gentleman but feeling "tired" Assisted OOB tio amb in hallway.  General bed mobility comments: Increased time, increased effort and use of rails.  General transfer comment: increased time and effort with 25% VC's on proper hand placement with stand to sit.  Also assisted to [facility].  Assisted with peri care due to unstable static standing balance.General Gait Details: MIn guard to ambulate in hall with RW with 25% VC's on upright posture.  Increased assist for safety with turns. Assisted back to bed. Pt will need ST Rehab at SNF to increase safety with transfers and gait as he plans to return home with spouse   Recommendations for follow up therapy are one component of a multi-disciplinary discharge planning process, led by the attending physician.  Recommendations may be updated based on patient status, additional functional criteria and insurance authorization.  Follow Up Recommendations  Skilled nursing-short term rehab (<3 hours/day)     Assistance Recommended at Discharge Frequent or constant Supervision/Assistance  Equipment Recommendations       Recommendations for Other Services       Precautions / Restrictions Precautions Precautions: Fall Precaution Comments: monitor BP     Mobility  Bed Mobility Overal bed mobility: Needs Assistance Bed Mobility: Supine to Sit;Sit to Supine     Supine to sit: Supervision;Min guard Sit to supine: Min guard;Min assist   General bed mobility comments: Increased time, increased effort and use of  rails    Transfers Overall transfer level: Needs assistance Equipment used: Rolling walker (2 wheels) Transfers: Sit to/from [facility] Sit to Stand: Min guard Stand pivot transfers: Min guard;Min assist         General transfer comment: increased time and effort with 25% VC's on proper hand placement with stand to sit.  Also assisted to [facility].  Assisted with peri care due to unstable static standing balance.    Ambulation/Gait Ambulation/Gait assistance: Min assist Gait Distance (Feet): 34 Feet Assistive device: Rolling walker (2 wheels) Gait Pattern/deviations: Step-through pattern;Decreased stride length Gait velocity: decreased   General Gait Details: MIn guard to ambulate in hall with RW with 25% VC's on upright posture.  Increased assist for safety with turns.   Stairs             Wheelchair Mobility    Modified Rankin (Stroke Patients Only)       Balance                                            Cognition Arousal/Alertness: Awake/alert Behavior During Therapy: WFL for tasks assessed/performed Overall Cognitive Status: Within Functional Limits for tasks assessed                                 General Comments: AxO x 3 very sweet Gentleman but feeling "tired"        Exercises  General Comments        Pertinent Vitals/Pain Pain Assessment: Faces Faces Pain Scale: Hurts a little bit Pain Location: back Pain Descriptors / Indicators: Sore;Aching;Grimacing Pain Intervention(s): Monitored during session;Repositioned    Home Living                          Prior Function            PT Goals (current goals can now be found in the care plan section) Progress towards PT goals: Progressing toward goals    Frequency    Min 3X/week      PT Plan Current plan remains appropriate    Co-evaluation              AM-PAC PT "6 Clicks" Mobility   Outcome Measure  Help  needed turning from your back to your side while in a flat bed without using bedrails?: A Little Help needed moving from lying on your back to sitting on the side of a flat bed without using bedrails?: A Little Help needed moving to and from a bed to a chair (including a wheelchair)?: A Little Help needed standing up from a chair using your arms (e.g., wheelchair or bedside chair)?: A Little Help needed to walk in hospital room?: A Lot Help needed climbing 3-5 steps with a railing? : A Lot 6 Click Score: 16    End of Session Equipment Utilized During Treatment: Gait belt Activity Tolerance: Patient limited by fatigue Patient left: with call bell/phone within reach;with family/visitor present;in bed;with bed alarm set Nurse Communication: Mobility status PT Visit Diagnosis: Muscle weakness (generalized) (M62.81);Difficulty in walking, not elsewhere classified (R26.2)     Time: 0923-3007 PT Time Calculation (min) (ACUTE ONLY): 24 min  Charges:  $Gait Training: 8-22 mins $Therapeutic Activity: 8-22 mins                     {Lawrence Hall  PTA Acute  Rehabilitation Services Pager      [phone] Office      [phone]

## 2021-02-21 NOTE — TOC Progression Note (Addendum)
Transition of Care [facility]) - Progression Note    Patient Details  Name: Lawrence Hall MRN: [id number] Date of Birth: 09-13-1945  Transition of Care [facility]) CM/SW Contact  Mariadejesus Cade, Juliann Pulse, RN Phone Number: 02/21/2021, 12:21 PM  Clinical Narrative: awaiting post peer to peer outcome.  2p-Presented denial letter from [employer] to patient on denial of SNF-[address]. Noted Dr. Hilma Favors has had discussions with patient. CM continue to follow for d/c plans.     Expected Discharge Plan: Skilled Nursing Facility Barriers to Discharge: Insurance Authorization  Expected Discharge Plan and Services Expected Discharge Plan: [place]   Discharge Planning Services: CM Consult   Living arrangements for the past 2 months: Single Family Home                                       Social Determinants of Health (SDOH) Interventions    Readmission Risk Interventions No flowsheet data found.

## 2021-02-21 NOTE — TOC Progression Note (Signed)
Transition of Care [facility]) - Progression Note    Patient Details  Name: Lawrence Hall MRN: [id number] Date of Birth: [birth year]  Transition of Care [facility]) CM/SW Contact  Maimuna Leaman, Juliann Pulse, RN Phone Number: 02/21/2021, 9:30 AM  Clinical Narrative: Awaiting outcom of peer to peer by attending & HTA med advisor Dr. Lynder Parents [phone] by 12p today.      Expected Discharge Plan: Skilled Nursing Facility Barriers to Discharge: Insurance Authorization  Expected Discharge Plan and Services Expected Discharge Plan: [place]   Discharge Planning Services: CM Consult   Living arrangements for the past 2 months: Single Family Home                                       Social Determinants of Health (SDOH) Interventions    Readmission Risk Interventions No flowsheet data found.

## 2021-02-21 NOTE — Progress Notes (Signed)
PROGRESS NOTE    Lawrence Hall  TTS:[id number] DOB: 11-09-1945 DOA: 02/15/2021 PCP: [name], MD    Brief Narrative:  75 year old gentleman history of stage IV urothelial carcinoma with hepatic and osseous mets status post radical resection to right humerus recently started on salvage chemotherapy, history of CAD status post CABG, moderate AAS status post AVR, postop A. fib without recurrence, first-degree AV block, LBBB, anemia of chronic disease, hypertension, hyperlipidemia presenting to the ED after syncopal episode around 4 AM on the day of admission.  Patient noted to have started recent palliative chemotherapy 02/12/2021 received hydration pre and posttreatment did well on the first day however since then has had fatigue, generalized weakness, nausea vomiting, inability to maintain any adequate oral intake with lightheadedness resulting in a syncopal episode.  On presentation to the ED patient noted to have a blood pressure 100/70, pulse of 118, sats of 99% on room air.  Cysts COVID-19 PCR negative.  Chest x-ray negative for any acute infiltrates.  CTA chest negative for PE.  Patient placed on IV Reglan, IV fluids, supportive care  Assessment & Plan:   Principal Problem:   Syncope Active Problems:   Essential hypertension   CAD (coronary artery disease), native coronary artery   Malignant tumor of renal pelvis, left (HCC)   Anemia   Nausea and vomiting   Protein-calorie malnutrition, severe   Hypokalemia   Weakness  1 syncope -Likely secondary to hypovolemia secondary to dehydration/orthostasis in the setting of ongoing nausea vomiting decreased oral intake after recent chemotherapy. -resolved -advanced to regular diet -Discontinue scheduled IV Reglan.     2.  Dehydration -was given IV fluids as tolerated  3.  Anemia of chronic disease -Likely secondary to recent chemotherapy. -Patient with no overt bleeding. -Hemoglobin noted at 7.1 on 02/16/2021, status post  transfusion 2 units packed red blood cells hemoglobin currently at 9.0.  -Transfusion threshold hemoglobin < 7.  4.  Nausea/vomiting/decreased appetite -Patient with progressive failure to thrive related to disease progression of cancer, acutely worsened in the setting of chemotherapy. -Patient started on scheduled IV Reglan with Zofran as needed with some clinical improvement. -Patient was started on clears diet advanced to a soft diet which she is tolerating.   -Change scheduled IV Reglan to as needed.   -stable. Tolerating diet  5.  Stage IV urothelial carcinoma with hepatic and osseous mets -Patient being followed by oncologist Dr. Alen Blew who has been notified via epic of patient's admission. -Patient noted to have been started on Padcev 10/26. -Per oncology's most recent office note patient likely approaching end-stage status and may need hospice. -Patient seen by oncology and patient noted to be on Palliative chemo and felt to be a marginal candidate for any additional treatment and per oncology note patient desires to continue aggressive measures. -cont per oncology  6.  Hypertension -Continue Toprol-XL as tolerated  7.  Coronary artery disease status post CABG/moderate AS status post AVR/postop A. fib without recurrence/hyperlipidemia -Stable. -continued on statin, aspirin, Toprol-XL.  8.  Hypokalemia -Likely secondary to GI losses. -Corrected  9.  Severe protein calorie malnutrition -Continue nutritional supplementation.    DVT prophylaxis: Lovenox subq Code Status: Full Family Communication: Pt in room, family not at bedside  Status is: Inpatient  Remains inpatient appropriate because: Severity of illness   Consultants:  Oncology  Procedures:  CT angiogram chest 02/15/2021 Chest x-ray 02/15/2021 Transfusion 2 units packed red blood cells.  Antimicrobials: Anti-infectives (From admission, onward)    None  Subjective: Reports overall decreased  appeitite but does better in the evening  Objective: Vitals:   02/20/21 1504 02/20/21 2212 02/21/21 0409 02/21/21 1338  BP: 140/75 124/71 123/66 138/81  Pulse: 84 90 88 77  Resp: 18 18 18    Temp:  98.1 F (36.7 C) 98.6 F (37 C)   TempSrc:  Oral Oral   SpO2: 99% 97% 93% 97%  Weight:      Height:       No intake or output data in the 24 hours ending 02/21/21 1827  Filed Weights   02/15/21 1341 02/19/21 0600 02/20/21 0519  Weight: 71.5 kg 71.9 kg 72.8 kg    Examination: General exam: Conversant, in no acute distress Respiratory system: normal chest rise, clear, no audible wheezing Cardiovascular system: regular rhythm, s1-s2 Gastrointestinal system: Nondistended, nontender, pos BS Central nervous system: No seizures, no tremors Extremities: No cyanosis, no joint deformities Skin: No rashes, no pallor Psychiatry: Affect normal // no auditory hallucinations   Data Reviewed: I have personally reviewed following labs and imaging studies  CBC: Recent Labs  Lab 02/15/21 1448 02/16/21 0338 02/16/21 1500 02/17/21 0821 02/18/21 0248 02/19/21 0653  WBC 10.4 12.1*  --  10.7* 10.1 9.1  NEUTROABS 8.7*  --   --  8.6*  --   --   HGB 8.4* 7.3* 7.1* 8.8* 9.0* 8.4*  HCT 27.1* 23.3* 22.6* 27.2* 27.6* 25.9*  MCV 83.4 83.5  --  84.0 83.4 83.5  PLT 264 217  --  206 199 671    Basic Metabolic Panel: Recent Labs  Lab 02/15/21 1448 02/16/21 0338 02/17/21 1021 02/18/21 0248 02/19/21 0653  NA 137 138 135 133* 132*  K 3.7 3.8 3.2* 3.6 4.0  CL 104 107 104 102 101  CO2 21* 21* 23 23 24   GLUCOSE 138* 118* 105* 110* 100*  BUN 28* 23 19 16 16   CREATININE 1.03 1.11 1.14 1.12 1.09  CALCIUM 9.2 8.6* 8.4* 8.3* 8.2*  MG  --   --  1.9  --  1.7    GFR: Estimated Creatinine Clearance: 58.4 mL/min (by C-G formula based on SCr of 1.09 mg/dL). Liver Function Tests: Recent Labs  Lab 02/15/21 1448  AST 28  ALT 15  ALKPHOS 109  BILITOT 1.3*  PROT 7.5  ALBUMIN 3.1*    No results  for input(s): LIPASE, AMYLASE in the last 168 hours. No results for input(s): AMMONIA in the last 168 hours. Coagulation Profile: No results for input(s): INR, PROTIME in the last 168 hours. Cardiac Enzymes: No results for input(s): CKTOTAL, CKMB, CKMBINDEX, TROPONINI in the last 168 hours. BNP (last 3 results) No results for input(s): PROBNP in the last 8760 hours. HbA1C: No results for input(s): HGBA1C in the last 72 hours. CBG: Recent Labs  Lab 02/16/21 0513 02/17/21 0826 02/20/21 0526 02/21/21 0425  GLUCAP 114* 95 107* 97    Lipid Profile: No results for input(s): CHOL, HDL, LDLCALC, TRIG, CHOLHDL, LDLDIRECT in the last 72 hours. Thyroid Function Tests: No results for input(s): TSH, T4TOTAL, FREET4, T3FREE, THYROIDAB in the last 72 hours. Anemia Panel: No results for input(s): VITAMINB12, FOLATE, FERRITIN, TIBC, IRON, RETICCTPCT in the last 72 hours. Sepsis Labs: Recent Labs  Lab 02/15/21 1448  LATICACIDVEN 1.1     Recent Results (from the past 240 hour(s))  Blood culture (routine x 2)     Status: None   Collection Time: 02/15/21  2:14 PM   Specimen: BLOOD RIGHT HAND  Result Value Ref Range Status  Specimen Description   Final    BLOOD RIGHT HAND Performed at [facility] [address] [place], [place] [address]    Special Requests   Final    BOTTLES DRAWN AEROBIC AND ANAEROBIC Blood Culture results may not be optimal due to an inadequate volume of blood received in culture bottles Performed at [facility] [address], [place], [place] [address]    Culture   Final    NO GROWTH 5 DAYS Performed at [facility], [place] [address], [place], [place] [address]    Report Status 02/20/2021 FINAL  Final  Resp Panel by RT-PCR (Flu A&B, Covid) Nasopharyngeal Swab     Status: None   Collection Time: 02/15/21  2:48 PM   Specimen: Nasopharyngeal Swab; Nasopharyngeal(NP) swabs in vial transport medium  Result Value Ref  Range Status   SARS Coronavirus 2 by RT PCR NEGATIVE NEGATIVE Final    Comment: (NOTE) SARS-CoV-2 target nucleic acids are NOT DETECTED.  The SARS-CoV-2 RNA is generally detectable in upper respiratory specimens during the acute phase of infection. The lowest concentration of SARS-CoV-2 viral copies this assay can detect is 138 copies/mL. A negative result does not preclude SARS-Cov-2 infection and should not be used as the sole basis for treatment or other patient management decisions. A negative result may occur with  improper specimen collection/handling, submission of specimen other than nasopharyngeal swab, presence of viral mutation(s) within the areas targeted by this assay, and inadequate number of viral copies(<138 copies/mL). A negative result must be combined with clinical observations, patient history, and epidemiological information. The expected result is Negative.  Fact Sheet for Patients:  EntrepreneurPulse.com.au  Fact Sheet for Healthcare Providers:  IncredibleEmployment.be  This test is no t yet approved or cleared by the [place] FDA and  has been authorized for detection and/or diagnosis of SARS-CoV-2 by FDA under an Emergency Use Authorization (EUA). This EUA will remain  in effect (meaning this test can be used) for the duration of the COVID-19 declaration under Section 564(b)(1) of the Act, 21 U.S.C.section 360bbb-3(b)(1), unless the authorization is terminated  or revoked sooner.       Influenza A by PCR NEGATIVE NEGATIVE Final   Influenza B by PCR NEGATIVE NEGATIVE Final    Comment: (NOTE) The Xpert Xpress SARS-CoV-2/FLU/RSV plus assay is intended as an aid in the diagnosis of influenza from Nasopharyngeal swab specimens and should not be used as a sole basis for treatment. Nasal washings and aspirates are unacceptable for Xpert Xpress SARS-CoV-2/FLU/RSV testing.  Fact Sheet for  Patients: EntrepreneurPulse.com.au  Fact Sheet for Healthcare Providers: IncredibleEmployment.be  This test is not yet approved or cleared by the [place] FDA and has been authorized for detection and/or diagnosis of SARS-CoV-2 by FDA under an Emergency Use Authorization (EUA). This EUA will remain in effect (meaning this test can be used) for the duration of the COVID-19 declaration under Section 564(b)(1) of the Act, 21 U.S.C. section 360bbb-3(b)(1), unless the authorization is terminated or revoked.  Performed at [facility], [place] [address] [place], [place] [address]   Blood culture (routine x 2)     Status: None   Collection Time: 02/15/21  2:49 PM   Specimen: BLOOD  Result Value Ref Range Status   Specimen Description   Final    BLOOD PORTA CATH Performed at [facility] [address], [place], [place] [address]    Special Requests   Final  BOTTLES DRAWN AEROBIC AND ANAEROBIC Blood Culture adequate volume Performed at [facility] [address] [place], [place] [address]    Culture   Final    NO GROWTH 5 DAYS Performed at [facility], [place] [address], [place], [place] [address]    Report Status 02/20/2021 FINAL  Final  Urine Culture     Status: None   Collection Time: 02/16/21  2:50 PM   Specimen: Urine, Clean Catch  Result Value Ref Range Status   Specimen Description   Final    URINE, CLEAN CATCH Performed at [facility], [place] [address] [place], [place] [address]    Special Requests   Final    NONE Performed at [facility], [place] [address] [place], [place] [address]    Culture   Final    NO GROWTH Performed at [facility], [place] [address] [place], [place] [address]    Report Status 02/17/2021 FINAL  Final  Resp Panel by RT-PCR (Flu A&B, Covid) Nasopharyngeal Swab     Status: None   Collection Time:  02/20/21  1:17 PM   Specimen: Nasopharyngeal Swab; Nasopharyngeal(NP) swabs in vial transport medium  Result Value Ref Range Status   SARS Coronavirus 2 by RT PCR NEGATIVE NEGATIVE Final    Comment: (NOTE) SARS-CoV-2 target nucleic acids are NOT DETECTED.  The SARS-CoV-2 RNA is generally detectable in upper respiratory specimens during the acute phase of infection. The lowest concentration of SARS-CoV-2 viral copies this assay can detect is 138 copies/mL. A negative result does not preclude SARS-Cov-2 infection and should not be used as the sole basis for treatment or other patient management decisions. A negative result may occur with  improper specimen collection/handling, submission of specimen other than nasopharyngeal swab, presence of viral mutation(s) within the areas targeted by this assay, and inadequate number of viral copies(<138 copies/mL). A negative result must be combined with clinical observations, patient history, and epidemiological information. The expected result is Negative.  Fact Sheet for Patients:  EntrepreneurPulse.com.au  Fact Sheet for Healthcare Providers:  IncredibleEmployment.be  This test is no t yet approved or cleared by the [place] FDA and  has been authorized for detection and/or diagnosis of SARS-CoV-2 by FDA under an Emergency Use Authorization (EUA). This EUA will remain  in effect (meaning this test can be used) for the duration of the COVID-19 declaration under Section 564(b)(1) of the Act, 21 U.S.C.section 360bbb-3(b)(1), unless the authorization is terminated  or revoked sooner.       Influenza A by PCR NEGATIVE NEGATIVE Final   Influenza B by PCR NEGATIVE NEGATIVE Final    Comment: (NOTE) The Xpert Xpress SARS-CoV-2/FLU/RSV plus assay is intended as an aid in the diagnosis of influenza from Nasopharyngeal swab specimens and should not be used as a sole basis for treatment. Nasal washings  and aspirates are unacceptable for Xpert Xpress SARS-CoV-2/FLU/RSV testing.  Fact Sheet for Patients: EntrepreneurPulse.com.au  Fact Sheet for Healthcare Providers: IncredibleEmployment.be  This test is not yet approved or cleared by the [place] FDA and has been authorized for detection and/or diagnosis of SARS-CoV-2 by FDA under an Emergency Use Authorization (EUA). This EUA will remain in effect (meaning this test can be used) for the duration of the COVID-19 declaration under Section 564(b)(1) of the Act, 21 U.S.C. section 360bbb-3(b)(1), unless the authorization is terminated or revoked.  Performed at [facility], [place] [address], [place],  [address]  Radiology Studies: No results found.  Scheduled Meds:  aspirin EC  81 mg Oral Daily   atorvastatin  80 mg Oral q1800   bisacodyl  10 mg Rectal Once   Chlorhexidine Gluconate Cloth  6 each Topical Daily   dexamethasone (DECADRON) injection  8 mg Intravenous Q24H   enoxaparin (LOVENOX) injection  40 mg Subcutaneous Q24H   feeding supplement  237 mL Oral TID BM   lidocaine  1 patch Transdermal Q24H   magic mouthwash w/lidocaine  15 mL Oral QID   melatonin  3 mg Oral QHS   metoprolol succinate  50 mg Oral QHS   mirtazapine  15 mg Oral QHS   multivitamin with minerals  1 tablet Oral Daily   sodium chloride flush  3 mL Intravenous Q12H   Continuous Infusions:     LOS: 5 days   Marylu Lund, MD [facility] Pager On Amion  If 7PM-7AM, please contact night-coverage 02/21/2021, 6:27 PM

## 2021-02-22 LAB — GLUCOSE, CAPILLARY: Glucose-Capillary: 184 mg/dL — ABNORMAL HIGH (ref 70–99)

## 2021-02-22 MED ORDER — HEPARIN SOD (PORK) LOCK FLUSH 100 UNIT/ML IV SOLN
500.0000 [IU] | INTRAVENOUS | Status: AC | PRN
  Administered 2021-02-22: 500 [IU]
  Filled 2021-02-22: qty 5

## 2021-02-22 MED ORDER — DEXAMETHASONE 4 MG PO TABS: 4.0000 mg | ORAL_TABLET | Freq: Every day | ORAL | 0 refills | Status: DC

## 2021-02-22 NOTE — TOC Progression Note (Signed)
Transition of Care [facility]) - Progression Note    Patient Details  Name: Lawrence Hall MRN: [id number] Date of Birth: 1945/10/07  Transition of Care [facility]) CM/SW Contact  Adithi Gammon, Marjie Skiff, RN Phone Number: 02/22/2021, 1:38 PM  Clinical Narrative:    [facility] consult for home hospice services. Choice offered to pt and [facility] chosen. [facility] liaison contacted for referral.   Expected Discharge Plan: Home w Hospice Care Barriers to Discharge: No Barriers Identified  Expected Discharge Plan and Services Expected Discharge Plan: [place]   Discharge Planning Services: CM Consult Post Acute Care Choice: Hospice Living arrangements for the past 2 months: Single Family Home                           HH Arranged: Disease Management [place] Agency: Hospice and [place] Determinants of Health (SDOH) Interventions    Readmission Risk Interventions Readmission Risk Prevention Plan 02/22/2021  Transportation Screening Complete  Medication Review [employer]) Complete  PCP or Specialist appointment within 3-5 days of discharge Complete  HRI or [place] Complete  SW Recovery Care/Counseling Consult Complete  [place] Not Applicable  Some recent data might be hidden

## 2021-02-22 NOTE — Progress Notes (Signed)
1700- Discharge instructions reviewed with pt and pt' wife. Paperwork and requested hospital items given to pt's wife.   1830- Pt taken out to pt's car via w/c. No distress noted.

## 2021-02-22 NOTE — Progress Notes (Signed)
PROGRESS NOTE    Lawrence Hall  XQJ:[id number] DOB: 07/17/1945 DOA: 02/15/2021 PCP: [name], MD    Brief Narrative:  75 year old gentleman history of stage IV urothelial carcinoma with hepatic and osseous mets status post radical resection to right humerus recently started on salvage chemotherapy, history of CAD status post CABG, moderate AAS status post AVR, postop A. fib without recurrence, first-degree AV block, LBBB, anemia of chronic disease, hypertension, hyperlipidemia presenting to the ED after syncopal episode around 4 AM on the day of admission.  Patient noted to have started recent palliative chemotherapy 02/12/2021 received hydration pre and posttreatment did well on the first day however since then has had fatigue, generalized weakness, nausea vomiting, inability to maintain any adequate oral intake with lightheadedness resulting in a syncopal episode.  On presentation to the ED patient noted to have a blood pressure 100/70, pulse of 118, sats of 99% on room air.  Cysts COVID-19 PCR negative.  Chest x-ray negative for any acute infiltrates.  CTA chest negative for PE.  Patient placed on IV Reglan, IV fluids, supportive care  Assessment & Plan:   Principal Problem:   Syncope Active Problems:   Essential hypertension   CAD (coronary artery disease), native coronary artery   Malignant tumor of renal pelvis, left (HCC)   Anemia   Nausea and vomiting   Protein-calorie malnutrition, severe   Hypokalemia   Weakness  1 syncope -Likely secondary to hypovolemia secondary to dehydration/orthostasis in the setting of ongoing nausea vomiting decreased oral intake after recent chemotherapy. -Pt improved with IVF -Successfully advanced to regular diet -Continued antiemetics, supportive care .   2.  Dehydration -IV fluids were given as tolerated  3.  Anemia of chronic disease -Likely secondary to recent chemotherapy. -Patient with no overt bleeding. -Hemoglobin noted at  7.1 on 02/16/2021, status post transfusion 2 units packed red blood cells hemoglobin remained stable  4.  Nausea/vomiting/decreased appetite -Patient with progressive failure to thrive related to disease progression of cancer, acutely worsened in the setting of chemotherapy. -Improved with antiemetics clinical improvement. -Patient was started on clears diet advanced to a regular diet, reported eating "like a horse" at time of d/c  5.  Stage IV urothelial carcinoma with hepatic and osseous mets -Patient being followed by oncologist Dr. Alen Blew who has been notified via epic of patient's admission. -Patient noted to have been started on Padcev 10/26. -Per oncology's most recent office note patient likely approaching end-stage status and may need hospice. -Patient seen by oncology and patient noted to be on Palliative chemo and felt to be a marginal candidate for any additional treatment  -Appreciate input by Palliative Care. Plan d/c with home hospice. Pt reports improvement with steroids, will prescribe on d/c  6.  Hypertension -Continue Toprol-XL as tolerated  7.  Coronary artery disease status post CABG/moderate AS status post AVR/postop A. fib without recurrence/hyperlipidemia -Stable. -Continue statin, aspirin, Toprol-XL.  8.  Hypokalemia -Likely secondary to GI losses. -Corrected  9.  Severe protein calorie malnutrition -Continue nutritional supplementation.    DVT prophylaxis: Lovenox subq Code Status: Full Family Communication: Pt in room, family not at bedside  Status is: Inpatient  Remains inpatient appropriate because: Severity of illness   Consultants:  Oncology  Procedures:  CT angiogram chest 02/15/2021 Chest x-ray 02/15/2021 Transfusion 2 units packed red blood cells.  Antimicrobials: Anti-infectives (From admission, onward)    None       Subjective: Eager to go home  Objective: Vitals:   02/21/21  1338 02/21/21 2100 02/21/21 2303 02/22/21 0450   BP: 138/81 117/72  127/76  Pulse: 77 87  79  Resp:  17    Temp:  98.2 F (36.8 C)  97.6 F (36.4 C)  TempSrc:  Oral  Oral  SpO2: 97% 97%  95%  Weight:   72.1 kg 73 kg  Height:       No intake or output data in the 24 hours ending 02/22/21 1332  Filed Weights   02/20/21 0519 02/21/21 2303 02/22/21 0450  Weight: 72.8 kg 72.1 kg 73 kg    Examination: General exam: Awake, laying in bed, in nad Respiratory system: Normal respiratory effort, no wheezing  Data Reviewed: I have personally reviewed following labs and imaging studies  CBC: Recent Labs  Lab 02/15/21 1448 02/16/21 0338 02/16/21 1500 02/17/21 0821 02/18/21 0248 02/19/21 0653  WBC 10.4 12.1*  --  10.7* 10.1 9.1  NEUTROABS 8.7*  --   --  8.6*  --   --   HGB 8.4* 7.3* 7.1* 8.8* 9.0* 8.4*  HCT 27.1* 23.3* 22.6* 27.2* 27.6* 25.9*  MCV 83.4 83.5  --  84.0 83.4 83.5  PLT 264 217  --  206 199 295    Basic Metabolic Panel: Recent Labs  Lab 02/15/21 1448 02/16/21 0338 02/17/21 1021 02/18/21 0248 02/19/21 0653  NA 137 138 135 133* 132*  K 3.7 3.8 3.2* 3.6 4.0  CL 104 107 104 102 101  CO2 21* 21* 23 23 24   GLUCOSE 138* 118* 105* 110* 100*  BUN 28* 23 19 16 16   CREATININE 1.03 1.11 1.14 1.12 1.09  CALCIUM 9.2 8.6* 8.4* 8.3* 8.2*  MG  --   --  1.9  --  1.7    GFR: Estimated Creatinine Clearance: 58.6 mL/min (by C-G formula based on SCr of 1.09 mg/dL). Liver Function Tests: Recent Labs  Lab 02/15/21 1448  AST 28  ALT 15  ALKPHOS 109  BILITOT 1.3*  PROT 7.5  ALBUMIN 3.1*    No results for input(s): LIPASE, AMYLASE in the last 168 hours. No results for input(s): AMMONIA in the last 168 hours. Coagulation Profile: No results for input(s): INR, PROTIME in the last 168 hours. Cardiac Enzymes: No results for input(s): CKTOTAL, CKMB, CKMBINDEX, TROPONINI in the last 168 hours. BNP (last 3 results) No results for input(s): PROBNP in the last 8760 hours. HbA1C: No results for input(s): HGBA1C in the  last 72 hours. CBG: Recent Labs  Lab 02/16/21 0513 02/17/21 0826 02/20/21 0526 02/21/21 0425 02/22/21 0614  GLUCAP 114* 95 107* 97 184*    Lipid Profile: No results for input(s): CHOL, HDL, LDLCALC, TRIG, CHOLHDL, LDLDIRECT in the last 72 hours. Thyroid Function Tests: No results for input(s): TSH, T4TOTAL, FREET4, T3FREE, THYROIDAB in the last 72 hours. Anemia Panel: No results for input(s): VITAMINB12, FOLATE, FERRITIN, TIBC, IRON, RETICCTPCT in the last 72 hours. Sepsis Labs: Recent Labs  Lab 02/15/21 1448  LATICACIDVEN 1.1     Recent Results (from the past 240 hour(s))  Blood culture (routine x 2)     Status: None   Collection Time: 02/15/21  2:14 PM   Specimen: BLOOD RIGHT HAND  Result Value Ref Range Status   Specimen Description   Final    BLOOD RIGHT HAND Performed at [facility], [place] [address] [place], [place] [address]    Special Requests   Final    BOTTLES DRAWN AEROBIC AND ANAEROBIC Blood Culture results may not be optimal due  to an inadequate volume of blood received in culture bottles Performed at [place] [address], [place], [place] [address]    Culture   Final    NO GROWTH 5 DAYS Performed at [facility], [place] [address], [place], [place] [address]    Report Status 02/20/2021 FINAL  Final  Resp Panel by RT-PCR (Flu A&B, Covid) Nasopharyngeal Swab     Status: None   Collection Time: 02/15/21  2:48 PM   Specimen: Nasopharyngeal Swab; Nasopharyngeal(NP) swabs in vial transport medium  Result Value Ref Range Status   SARS Coronavirus 2 by RT PCR NEGATIVE NEGATIVE Final    Comment: (NOTE) SARS-CoV-2 target nucleic acids are NOT DETECTED.  The SARS-CoV-2 RNA is generally detectable in upper respiratory specimens during the acute phase of infection. The lowest concentration of SARS-CoV-2 viral copies this assay can detect is 138 copies/mL. A negative result does not preclude SARS-Cov-2 infection  and should not be used as the sole basis for treatment or other patient management decisions. A negative result may occur with  improper specimen collection/handling, submission of specimen other than nasopharyngeal swab, presence of viral mutation(s) within the areas targeted by this assay, and inadequate number of viral copies(<138 copies/mL). A negative result must be combined with clinical observations, patient history, and epidemiological information. The expected result is Negative.  Fact Sheet for Patients:  EntrepreneurPulse.com.au  Fact Sheet for Healthcare Providers:  IncredibleEmployment.be  This test is no t yet approved or cleared by the [place] FDA and  has been authorized for detection and/or diagnosis of SARS-CoV-2 by FDA under an Emergency Use Authorization (EUA). This EUA will remain  in effect (meaning this test can be used) for the duration of the COVID-19 declaration under Section 564(b)(1) of the Act, 21 U.S.C.section 360bbb-3(b)(1), unless the authorization is terminated  or revoked sooner.       Influenza A by PCR NEGATIVE NEGATIVE Final   Influenza B by PCR NEGATIVE NEGATIVE Final    Comment: (NOTE) The Xpert Xpress SARS-CoV-2/FLU/RSV plus assay is intended as an aid in the diagnosis of influenza from Nasopharyngeal swab specimens and should not be used as a sole basis for treatment. Nasal washings and aspirates are unacceptable for Xpert Xpress SARS-CoV-2/FLU/RSV testing.  Fact Sheet for Patients: EntrepreneurPulse.com.au  Fact Sheet for Healthcare Providers: IncredibleEmployment.be  This test is not yet approved or cleared by the [place] FDA and has been authorized for detection and/or diagnosis of SARS-CoV-2 by FDA under an Emergency Use Authorization (EUA). This EUA will remain in effect (meaning this test can be used) for the duration of the COVID-19 declaration  under Section 564(b)(1) of the Act, 21 U.S.C. section 360bbb-3(b)(1), unless the authorization is terminated or revoked.  Performed at [facility], [place] [address], [place], [place] [address]   Blood culture (routine x 2)     Status: None   Collection Time: 02/15/21  2:49 PM   Specimen: BLOOD  Result Value Ref Range Status   Specimen Description   Final    BLOOD PORTA CATH Performed at [place] [address] [place], [place] [address]    Special Requests   Final    BOTTLES DRAWN AEROBIC AND ANAEROBIC Blood Culture adequate volume Performed at [facility] [address] [place], [place] [address]    Culture   Final    NO GROWTH 5 DAYS Performed at [facility], [place],  [place] [address]    Report Status 02/20/2021 FINAL  Final  Urine Culture     Status: None   Collection Time: 02/16/21  2:50 PM   Specimen: Urine, Clean Catch  Result Value Ref Range Status   Specimen Description   Final    URINE, CLEAN CATCH Performed at [facility], [place] [address] [place], [place] [address]    Special Requests   Final    NONE Performed at [facility], [place] [address] [place], [place] [address]    Culture   Final    NO GROWTH Performed at [facility], [place] [address], [place], [place] [address]    Report Status 02/17/2021 FINAL  Final  Resp Panel by RT-PCR (Flu A&B, Covid) Nasopharyngeal Swab     Status: None   Collection Time: 02/20/21  1:17 PM   Specimen: Nasopharyngeal Swab; Nasopharyngeal(NP) swabs in vial transport medium  Result Value Ref Range Status   SARS Coronavirus 2 by RT PCR NEGATIVE NEGATIVE Final    Comment: (NOTE) SARS-CoV-2 target nucleic acids are NOT DETECTED.  The SARS-CoV-2 RNA is generally detectable in upper respiratory specimens during the acute phase of infection. The lowest concentration of SARS-CoV-2 viral copies this assay  can detect is 138 copies/mL. A negative result does not preclude SARS-Cov-2 infection and should not be used as the sole basis for treatment or other patient management decisions. A negative result may occur with  improper specimen collection/handling, submission of specimen other than nasopharyngeal swab, presence of viral mutation(s) within the areas targeted by this assay, and inadequate number of viral copies(<138 copies/mL). A negative result must be combined with clinical observations, patient history, and epidemiological information. The expected result is Negative.  Fact Sheet for Patients:  EntrepreneurPulse.com.au  Fact Sheet for Healthcare Providers:  IncredibleEmployment.be  This test is no t yet approved or cleared by the [place] FDA and  has been authorized for detection and/or diagnosis of SARS-CoV-2 by FDA under an Emergency Use Authorization (EUA). This EUA will remain  in effect (meaning this test can be used) for the duration of the COVID-19 declaration under Section 564(b)(1) of the Act, 21 U.S.C.section 360bbb-3(b)(1), unless the authorization is terminated  or revoked sooner.       Influenza A by PCR NEGATIVE NEGATIVE Final   Influenza B by PCR NEGATIVE NEGATIVE Final    Comment: (NOTE) The Xpert Xpress SARS-CoV-2/FLU/RSV plus assay is intended as an aid in the diagnosis of influenza from Nasopharyngeal swab specimens and should not be used as a sole basis for treatment. Nasal washings and aspirates are unacceptable for Xpert Xpress SARS-CoV-2/FLU/RSV testing.  Fact Sheet for Patients: EntrepreneurPulse.com.au  Fact Sheet for Healthcare Providers: IncredibleEmployment.be  This test is not yet approved or cleared by the [place] FDA and has been authorized for detection and/or diagnosis of SARS-CoV-2 by FDA under an Emergency Use Authorization (EUA). This EUA will  remain in effect (meaning this test can be used) for the duration of the COVID-19 declaration under Section 564(b)(1) of the Act, 21 U.S.C. section 360bbb-3(b)(1), unless the authorization is terminated or revoked.  Performed at [facility], [place] [address], [place],  [address]       Radiology Studies: No results found.  Scheduled Meds:  aspirin EC  81 mg Oral Daily   atorvastatin  80 mg Oral q1800   bisacodyl  10 mg Rectal Once   Chlorhexidine Gluconate Cloth  6 each Topical Daily  dexamethasone (DECADRON) injection  8 mg Intravenous Q24H   enoxaparin (LOVENOX) injection  40 mg Subcutaneous Q24H   feeding supplement  237 mL Oral TID BM   lidocaine  1 patch Transdermal Q24H   magic mouthwash w/lidocaine  15 mL Oral QID   melatonin  3 mg Oral QHS   metoprolol succinate  50 mg Oral QHS   mirtazapine  15 mg Oral QHS   multivitamin with minerals  1 tablet Oral Daily   sodium chloride flush  3 mL Intravenous Q12H   Continuous Infusions:     LOS: 6 days   Marylu Lund, MD [facility] Pager On Amion  If 7PM-7AM, please contact night-coverage 02/22/2021, 1:32 PM

## 2021-02-22 NOTE — Progress Notes (Signed)
Pt taken out to car with wife via w/c. NAD noted.

## 2021-02-22 NOTE — Progress Notes (Signed)
[facility])  Referral received for hospice services at home.  Spoke with patient and his wife. Confirmed interested, provided support.  No current DME needs.  Pt wife will transport him home once dc is completed.  If appropriate, please arrange for any comfort meds that may be needed so there is no lapse in his comfort prior to hospice arriving in the home.  Venia Carbon BSN, RN [facility] Liaison

## 2021-02-25 MED FILL — Dexamethasone Sodium Phosphate Inj 100 MG/10ML: INTRAMUSCULAR | Qty: 1 | Status: AC

## 2021-02-26 ENCOUNTER — Other Ambulatory Visit: Payer: Self-pay

## 2021-02-26 ENCOUNTER — Inpatient Hospital Stay: Payer: PPO | Admitting: Oncology

## 2021-02-26 ENCOUNTER — Inpatient Hospital Stay: Payer: PPO | Attending: Oncology

## 2021-02-26 ENCOUNTER — Inpatient Hospital Stay: Payer: PPO

## 2021-02-26 VITALS — BP 135/71 | HR 88 | Temp 97.4°F | Resp 18 | Wt 160.4 lb

## 2021-02-26 DIAGNOSIS — Z95828 Presence of other vascular implants and grafts: Secondary | ICD-10-CM

## 2021-02-26 DIAGNOSIS — C688 Malignant neoplasm of overlapping sites of urinary organs: Secondary | ICD-10-CM | POA: Insufficient documentation

## 2021-02-26 DIAGNOSIS — C7951 Secondary malignant neoplasm of bone: Secondary | ICD-10-CM | POA: Insufficient documentation

## 2021-02-26 DIAGNOSIS — C679 Malignant neoplasm of bladder, unspecified: Secondary | ICD-10-CM | POA: Diagnosis not present

## 2021-02-26 DIAGNOSIS — D649 Anemia, unspecified: Secondary | ICD-10-CM | POA: Diagnosis not present

## 2021-02-26 DIAGNOSIS — Z79899 Other long term (current) drug therapy: Secondary | ICD-10-CM | POA: Diagnosis not present

## 2021-02-26 DIAGNOSIS — C787 Secondary malignant neoplasm of liver and intrahepatic bile duct: Secondary | ICD-10-CM | POA: Diagnosis not present

## 2021-02-26 LAB — CMP (CANCER CENTER ONLY)
ALT: 18 U/L (ref 0–44)
AST: 24 U/L (ref 15–41)
Albumin: 2.7 g/dL — ABNORMAL LOW (ref 3.5–5.0)
Alkaline Phosphatase: 124 U/L (ref 38–126)
Anion gap: 7 (ref 5–15)
BUN: 33 mg/dL — ABNORMAL HIGH (ref 8–23)
CO2: 24 mmol/L (ref 22–32)
Calcium: 8.4 mg/dL — ABNORMAL LOW (ref 8.9–10.3)
Chloride: 104 mmol/L (ref 98–111)
Creatinine: 0.96 mg/dL (ref 0.61–1.24)
GFR, Estimated: >60 mL/min (ref >60)
Glucose, Bld: 108 mg/dL — ABNORMAL HIGH (ref 70–99)
Potassium: 4.4 mmol/L (ref 3.5–5.1)
Sodium: 135 mmol/L (ref 135–145)
Total Bilirubin: 0.6 mg/dL (ref 0.3–1.2)
Total Protein: 6.5 g/dL (ref 6.5–8.1)

## 2021-02-26 LAB — CBC WITH DIFFERENTIAL (CANCER CENTER ONLY)
Abs Immature Granulocytes: 0.2 10*3/uL — ABNORMAL HIGH (ref 0.00–0.07)
Basophils Absolute: 0 10*3/uL (ref 0.0–0.1)
Basophils Relative: 0 %
Eosinophils Absolute: 0 10*3/uL (ref 0.0–0.5)
Eosinophils Relative: 0 %
HCT: 28.9 % — ABNORMAL LOW (ref 39.0–52.0)
Hemoglobin: 9.1 g/dL — ABNORMAL LOW (ref 13.0–17.0)
Immature Granulocytes: 2 %
Lymphocytes Relative: 9 %
Lymphs Abs: 1.1 10*3/uL (ref 0.7–4.0)
MCH: 26.2 pg (ref 26.0–34.0)
MCHC: 31.5 g/dL (ref 30.0–36.0)
MCV: 83.3 fL (ref 80.0–100.0)
Monocytes Absolute: 0.9 10*3/uL (ref 0.1–1.0)
Monocytes Relative: 7 %
Neutro Abs: 10.2 10*3/uL — ABNORMAL HIGH (ref 1.7–7.7)
Neutrophils Relative %: 82 %
Platelet Count: 335 10*3/uL (ref 150–400)
RBC: 3.47 MIL/uL — ABNORMAL LOW (ref 4.22–5.81)
RDW: 17.2 % — ABNORMAL HIGH (ref 11.5–15.5)
WBC Count: 12.6 10*3/uL — ABNORMAL HIGH (ref 4.0–10.5)
nRBC: 0 % (ref 0.0–0.2)

## 2021-02-26 LAB — SAMPLE TO BLOOD BANK

## 2021-02-26 LAB — TSH: TSH: 1.672 u[IU]/mL (ref 0.320–4.118)

## 2021-02-26 MED ORDER — SODIUM CHLORIDE 0.9 % IV SOLN: Freq: Once | INTRAVENOUS | Status: AC

## 2021-02-26 MED ORDER — SODIUM CHLORIDE 0.9 % IV SOLN: Freq: Once | INTRAVENOUS | Status: DC

## 2021-02-26 MED ORDER — SODIUM CHLORIDE 0.9% FLUSH
10.0000 mL | Freq: Once | INTRAVENOUS | Status: AC | PRN
  Administered 2021-02-26: 10 mL

## 2021-02-26 MED ORDER — SODIUM CHLORIDE 0.9% FLUSH
10.0000 mL | Freq: Once | INTRAVENOUS | Status: AC
  Administered 2021-02-26: 10 mL

## 2021-02-26 MED ORDER — ONDANSETRON HCL 4 MG/2ML IJ SOLN: 8.0000 mg | Freq: Once | INTRAMUSCULAR | Status: DC

## 2021-02-26 MED ORDER — HEPARIN SOD (PORK) LOCK FLUSH 100 UNIT/ML IV SOLN
500.0000 [IU] | Freq: Once | INTRAVENOUS | Status: AC | PRN
  Administered 2021-02-26: 500 [IU]

## 2021-02-26 NOTE — Patient Instructions (Signed)

## 2021-02-26 NOTE — Progress Notes (Signed)
Hematology and Oncology Follow Up Visit  Lawrence Hall [id number] March 15, 1946 75 y.o. 02/26/2021 10:45 AM [name], MDKoberlein, [name], MD   Principle Diagnosis: 75 year old man with left renal pelvis tumor diagnosed in 2021.  He developed stage IV high-grade urothelial carcinoma with bone and hepatic metastasis.   Prior Therapy: He is status post biopsy obtained on Aug 21, 2019 which showed high-grade urothelial carcinoma of the left renal pelvis.   He status post neoadjuvant chemotherapy utilizing gemcitabine and cisplatin given on day 1 and cisplatin given on day 8 start her on 10/06/2019.   He completed 3 cycles of therapy in August 2021.  He is status post left robotic assisted laparoscopic nephroureterectomy and retroperitoneal lymphadenectomy completed on 01/04/2020.  He was found to have a residual T4 disease with 1 out of 5 lymph nodes involved.  He is status post tumor resection from his right humerus with surgical fixation and rodding completed by Dr. Lovena Le at [place] completed in June 2022.  The final pathology confirmed the presence of metastatic urothelial carcinoma.  Radiation therapy to be right humerus.  He completed 10 fractions for a total of 30 Gray completed in July 2022.  Pembrolizumab 200 mg started on Sep 13, 2020.  He is currently receiving 400 mg every 6 weeks.  He completed cycle 3 on December 13, 2020.  Current therapy: Padcev 1 mg/kg day 1 and day 15 out of a 28-day cycle started on February 12, 2021.      Interim History: Mr. Copes returns today for repeat evaluation.  Since the last visit, he was hospitalized between October 29 and February 22, 2021 due to failure to thrive.  He had received intravenous hydration and supportive management and subsequently was discharged with hospice care.  Since his discharge, he has reported improvement in his performance status and quality of life.  He was started on dexamethasone which have helped his symptoms.   He is eating better and ambulating with little difficulties.  He has gained 2 pounds.  His oral intake although improved still lacking and susceptible to dehydration.    Medications: Updated on review. Current Outpatient Medications  Medication Sig Dispense Refill   aspirin EC 81 MG tablet Take 1 tablet (81 mg total) by mouth daily. Swallow whole. (Patient taking differently: Take 81 mg by mouth at bedtime. Swallow whole.) 30 tablet 11   atorvastatin (LIPITOR) 80 MG tablet TAKE 1 TABLET ONCE DAILY AT 6 PM. (Patient taking differently: Take 80 mg by mouth daily at 6 PM.) 90 tablet 3   Cholecalciferol (VITAMIN D) 50 MCG (2000 UT) tablet Take 2,000 Units by mouth daily with lunch.     dexamethasone (DECADRON) 4 MG tablet Take 1 tablet (4 mg total) by mouth daily. 30 tablet 0   metoprolol succinate (TOPROL-XL) 50 MG 24 hr tablet Take 1 tablet (50 mg total) by mouth at bedtime. 90 tablet 3   Milk Thistle 1000 MG CAPS Take 1,000 mg by mouth every other day.     mirtazapine (REMERON) 15 MG tablet Take 1 tablet (15 mg total) by mouth at bedtime. 90 tablet 1   nitroGLYCERIN (NITROSTAT) 0.4 MG SL tablet 1 TAB UNDER TONGUE AS NEEDED FOR CHEST PAIN. MAY REPEAT EVERY 5 MIN FOR A TOTAL OF 3 DOSES. (Patient taking differently: Place 0.4 mg under the tongue every 5 (five) minutes as needed for chest pain.) 25 tablet 4   oxyCODONE (OXY IR/ROXICODONE) 5 MG immediate release tablet Take 1 tablet (5  mg total) by mouth every 4 (four) hours as needed for breakthrough pain or moderate pain. 20 tablet 0   polyethylene glycol (MIRALAX / GLYCOLAX) 17 g packet Take 17 g by mouth daily. (Patient taking differently: Take 17 g by mouth daily as needed (constipation).) 14 each 0   No current facility-administered medications for this visit.     Allergies:  Allergies  Allergen Reactions   Other Itching and Other (See Comments)    Pet dander, seasonal allergies = Runny nose, itchy eyes      Physical Exam:   Blood  pressure 135/71, pulse 88, temperature (!) 97.4 F (36.3 C), resp. rate 18, weight 160 lb 6.4 oz (72.8 kg), SpO2 100 %.       ECOG: 2    General appearance: Alert, awake without any distress. Head: Atraumatic without abnormalities Oropharynx: Without any thrush or ulcers. Eyes: No scleral icterus. Lymph nodes: No lymphadenopathy noted in the cervical, supraclavicular, or axillary nodes Heart:regular rate and rhythm, without any murmurs or gallops.   Lung: Clear to auscultation without any rhonchi, wheezes or dullness to percussion. Abdomin: Soft, nontender without any shifting dullness or ascites. Musculoskeletal: No clubbing or cyanosis. Neurological: No motor or sensory deficits. Skin: No rashes or lesions.             Lab Results: Lab Results  Component Value Date   WBC 9.1 02/19/2021   HGB 8.4 (L) 02/19/2021   HCT 25.9 (L) 02/19/2021   MCV 83.5 02/19/2021   PLT 179 02/19/2021     Chemistry      Component Value Date/Time   NA 132 (L) 02/19/2021 0653   NA 137 04/07/2019 1021   K 4.0 02/19/2021 0653   CL 101 02/19/2021 0653   CO2 24 02/19/2021 0653   BUN 16 02/19/2021 0653   BUN 29 (H) 04/07/2019 1021   CREATININE 1.09 02/19/2021 0653   CREATININE 1.28 (H) 02/12/2021 1322      Component Value Date/Time   CALCIUM 8.2 (L) 02/19/2021 0653   ALKPHOS 109 02/15/2021 1448   AST 28 02/15/2021 1448   AST 27 02/12/2021 1322   ALT 15 02/15/2021 1448   ALT 15 02/12/2021 1322   BILITOT 1.3 (H) 02/15/2021 1448   BILITOT 1.1 02/12/2021 1322         Impression and Plan:  75 year old man with:     1. Stage IV high-grade urothelial carcinoma of the renal pelvis with hepatic and bone involvement.  Treatment options moving forward were discussed at this time.  His overall health and performance status has declined and palliative chemotherapy does not appear to have impacted his overall prognosis is poor with limited life expectancy.  Risks and benefits of  resuming chemotherapy versus continuing hospice care were discussed.  After discussion today, we opted to continue to hold chemotherapy and resume IV hydration on a weekly basis as planned.  We will assess the need for chemotherapy restart in the future.     2.  IV access: Port-A-Cath remains in use and will be flushed periodically.    3.  Failure to thrive: Appears to be multifactorial predominantly related to malignancy rather than chemotherapy.  He still appears dehydrated and will continue with IV fluid hydration on a weekly basis.      4.  Anemia: Related to malignancy and chemotherapy.  Hemoglobin from today appears adequate above 9 and does not require transfusion.   5.  Prognosis and goals of care: His prognosis is poor with  limited life expectancy and currently approaching end-of-life care.   6.  Follow-up: He will continue to follow weekly for IV fluids and MD follow-up in 4 weeks.     30  minutes were spent on this visit.  The time was dedicated to reviewing laboratory data, disease status update and outlining future plan of care.       [name], MD 11/9/202210:45 AM

## 2021-02-28 ENCOUNTER — Encounter: Payer: Self-pay | Admitting: Family Medicine

## 2021-03-02 ENCOUNTER — Encounter: Payer: Self-pay | Admitting: Oncology

## 2021-03-04 ENCOUNTER — Encounter: Payer: Self-pay | Admitting: Oncology

## 2021-03-04 ENCOUNTER — Encounter: Payer: Self-pay | Admitting: Cardiovascular Disease

## 2021-03-04 ENCOUNTER — Other Ambulatory Visit: Payer: Self-pay

## 2021-03-04 ENCOUNTER — Ambulatory Visit: Payer: PPO | Admitting: Cardiovascular Disease

## 2021-03-04 VITALS — BP 130/77 | HR 78 | Ht 72.0 in | Wt 167.4 lb

## 2021-03-04 DIAGNOSIS — I447 Left bundle-branch block, unspecified: Secondary | ICD-10-CM | POA: Diagnosis not present

## 2021-03-04 DIAGNOSIS — Z953 Presence of xenogenic heart valve: Secondary | ICD-10-CM | POA: Diagnosis not present

## 2021-03-04 DIAGNOSIS — I9789 Other postprocedural complications and disorders of the circulatory system, not elsewhere classified: Secondary | ICD-10-CM

## 2021-03-04 DIAGNOSIS — E7800 Pure hypercholesterolemia, unspecified: Secondary | ICD-10-CM

## 2021-03-04 DIAGNOSIS — I2510 Atherosclerotic heart disease of native coronary artery without angina pectoris: Secondary | ICD-10-CM | POA: Diagnosis not present

## 2021-03-04 DIAGNOSIS — C7910 Secondary malignant neoplasm of unspecified urinary organs: Secondary | ICD-10-CM

## 2021-03-04 DIAGNOSIS — I4891 Unspecified atrial fibrillation: Secondary | ICD-10-CM

## 2021-03-04 DIAGNOSIS — I10 Essential (primary) hypertension: Secondary | ICD-10-CM | POA: Diagnosis not present

## 2021-03-04 DIAGNOSIS — Z905 Acquired absence of kidney: Secondary | ICD-10-CM

## 2021-03-04 NOTE — Telephone Encounter (Signed)
I called to speak with pt to clarify his question. Pt wanted to know if he has had a CT scan to monitor his tumor sizes since his last tx. Pt was advised, no, he has not. Pt expressed understanding.

## 2021-03-04 NOTE — Patient Instructions (Signed)
Medication Instructions:  No changes *If you need a refill on your cardiac medications before your next appointment, please call your pharmacy*   Lab Work: Your provider would like for you to have the following labs: fasting lipid  If you have labs (blood work) drawn today and your tests are completely normal, you will receive your results only by: [place] (if you have MyChart) OR A paper copy in the mail If you have any lab test that is abnormal or we need to change your treatment, we will call you to review the results.   Testing/Procedures: None ordered   Follow-Up: At [facility], you and your health needs are our priority.  As part of our continuing mission to provide you with exceptional heart care, we have created designated Provider Care Teams.  These Care Teams include your primary Cardiologist (physician) and Advanced Practice Providers (APPs -  Physician Assistants and Nurse Practitioners) who all work together to provide you with the care you need, when you need it.  We recommend signing up for the patient portal called "MyChart".  Sign up information is provided on this After Visit Summary.  MyChart is used to connect with patients for Virtual Visits (Telemedicine).  Patients are able to view lab/test results, encounter notes, upcoming appointments, etc.  Non-urgent messages can be sent to your provider as well.   To learn more about what you can do with MyChart, go to [email or web address].    Your next appointment:   12 month(s)  The format for your next appointment:   In Person  Provider:   Sanda Klein, MD

## 2021-03-04 NOTE — Progress Notes (Signed)
Patient ID: Lawrence Hall, male   DOB: 05-Dec-1945, 75 y.o.   MRN: [id number] Patient ID: Lawrence Hall, male   DOB: [birth year], 75 y.o.   MRN: [id number]    Cardiology Office Note    Date:  03/04/2021   ID:  Lawrence Hall, DOB 1946-03-17, MRN [id number]  PCP:  [name], MD  Cardiologist:   Sanda Klein, MD   Chief Complaint  Patient presents with   Coronary Artery Disease     History of Present Illness:  Lawrence Hall is a 75 y.o. male with hyperlipidemia, hypertension, coronary artery disease, history of non-ST segment elevation myocardial infarction in 2017 due to a high-grade stenosis in the (proximal left circumflex coronary drug-eluting stent, 3.534 Resolute), returning with exertional angina/dyspnea and hypotensive response to stress testing in January 2020, found to have left main and proximal right coronary stenoses.  He therefore underwent CABG x3 (LIMA to LAD, SVG to OM, SVG to RCA) and aortic valve replacement with a 25 mm Edwards pericardial valve by Dr. Prescott Gum on April 29, 2018.  Postoperative course was complicated by atrial fibrillation and development of a left bundle branch block, superficial vein irritation probably from amiodarone.  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  Ultimately found to have T4N1 disease and is being followed by Dr. Alen Blew.  His creatinine has stabilized at approximately 1.7 following the nephrectomy.  He has evidence of bony metastases to the left humerus (underwent surgical resection and reconstruction followed by radiation therapy) and subsequently to the liver.  He started new palliative chemotherapy (padcev) last month and had to be hospitalized due to side effects (syncope due to nausea, vomiting, poor p.o. intake).  He has recovered slowly since then and is gaining back some weight.  He is still on  dexamethasone.  He has a follow-up visit scheduled with Dr. Alen Blew on December 7 to discuss future plans for therapy.  He denies problems with chest pain or dyspnea either at rest or with activity.  Has not had palpitations.  He has not had dizziness and syncope outside of the episode of dehydration.  He denies orthopnea, PND, claudication and focal neurological events.  Echocardiogram performed February 21, 2019 shows excellent aortic valve prosthesis parameters (peak gradient 16, mean gradient 10, dimensionless index 0.6) and normal left ventricular systolic function.  Had a follow-up echocardiogram since.  Past Medical History:  Diagnosis Date   Arthritis    mild hands   Asthma    pt denies 4/21    Chest pain 07/25/2012   Coronary artery disease    Dysrhythmia    PAC'S, hx of atrial fib after OHS 04/2018   ED (erectile dysfunction)    H/O hiatal hernia    Heart murmur    Hypercholesterolemia    Hypertension    MARGINALLY HIGH - NO MEDS -MONITORS   Metastatic cancer to bone ([place]) dx'd 07/2020   rt humerus   Myocardial infarction ([place])    2017    Pneumonia    hx of 2018    PONV (postoperative nausea and vomiting)    Prostate cancer ([place]) 03/27/2011   prostate bx=adenocarcinoma,   Skin cancer 2008   basal cell face /removed   Sleep apnea    borderline sleep apnea per patient no devices    Urothelial cancer ([place]) dx'd 05/2019   left renal ca  Past Surgical History:  Procedure Laterality Date   AORTIC VALVE REPLACEMENT N/A 04/29/2018   Procedure: AORTIC VALVE REPLACEMENT (AVR) using INSPIRIS RESILIA  AORTIC VALVE 31mm;  Surgeon: Ivin Poot, MD;  Location: [facility];  Service: Open Heart Surgery;  Laterality: N/A;   basal cell cancer  2008   inside nose   CARDIAC CATHETERIZATION N/A 06/03/2015   Procedure: Left Heart Cath and Coronary Angiography;  Surgeon: [name] Sine, MD;  Location: [facility];  Service: Cardiovascular;  Laterality: N/A;   CATARACT EXTRACTION,  BILATERAL  2007   CORONARY ARTERY BYPASS GRAFT N/A 04/29/2018   Procedure: CORONARY ARTERY BYPASS GRAFTING (CABG) X 3; Using Left Internal Mammary Artery (LIMA) and Right Leg Greater Saphenous Vein Graft (SVG);  LIMA to LAD, SVG to OM1, SVG to RCA,;  Surgeon: Ivin Poot, MD;  Location: [facility];  Service: Open Heart Surgery;  Laterality: N/A;   CYSTOSCOPY/RETROGRADE/URETEROSCOPY Left 08/21/2019   Procedure: CYSTOSCOPY/RETROGRADE/URETEROSCOPY/  LEFT BIOPSY/BRUSH BIOPSY/RENAL WASHINGS/ AND STENT PLACEMENT;  Surgeon: Raynelle Bring, MD;  Location: [facility];  Service: Urology;  Laterality: Left;   IR IMAGING GUIDED PORT INSERTION  09/28/2019   RIGHT/LEFT HEART CATH AND CORONARY ANGIOGRAPHY N/A 04/26/2018   Procedure: RIGHT/LEFT HEART CATH AND CORONARY ANGIOGRAPHY;  Surgeon: [name] Sine, MD;  Location: [facility];  Service: Cardiovascular;  Laterality: N/A;   ROBOT ASSISTED LAPAROSCOPIC RADICAL PROSTATECTOMY  08/10/2011   Procedure: ROBOTIC ASSISTED LAPAROSCOPIC RADICAL PROSTATECTOMY LEVEL 2;  Surgeon: Dutch Gray, MD;  [facility];  Service: Urology;  Laterality: N/A;   ROBOT ASSITED LAPAROSCOPIC NEPHROURETERECTOMY Left 01/04/2020   Procedure: XI ROBOT ASSITED LAPAROSCOPIC NEPHROURETERECTOMY;  Surgeon: Raynelle Bring, MD;  [facility];  Service: Urology;  Laterality: Left;  NEEDS 240 MIN FOR PROCEDURE   TEE WITHOUT CARDIOVERSION N/A 04/29/2018   Procedure: TRANSESOPHAGEAL ECHOCARDIOGRAM (TEE);  Surgeon: Prescott Gum, Collier Salina, MD;  Location: [facility];  Service: Open Heart Surgery;  Laterality: N/A;   THYROID SURGERY  infant   uncertain details; no thyroid replacement listed   WISDOM TOOTH EXTRACTION      Current Medications: Outpatient Medications Prior to Visit  Medication Sig Dispense Refill   aspirin EC 81 MG tablet Take 1 tablet (81 mg total) by mouth daily. Swallow whole. (Patient taking differently: Take 81 mg by mouth at bedtime. Swallow whole.) 30 tablet 11   atorvastatin (LIPITOR) 80  MG tablet TAKE 1 TABLET ONCE DAILY AT 6 PM. (Patient taking differently: Take 80 mg by mouth daily at 6 PM.) 90 tablet 3   Cholecalciferol (VITAMIN D) 50 MCG (2000 UT) tablet Take 2,000 Units by mouth daily with lunch.     dexamethasone (DECADRON) 4 MG tablet Take 1 tablet (4 mg total) by mouth daily. 30 tablet 0   metoprolol succinate (TOPROL-XL) 50 MG 24 hr tablet Take 1 tablet (50 mg total) by mouth at bedtime. 90 tablet 3   Milk Thistle 1000 MG CAPS Take 1,000 mg by mouth every other day.     mirtazapine (REMERON) 15 MG tablet Take 1 tablet (15 mg total) by mouth at bedtime. 90 tablet 1   nitroGLYCERIN (NITROSTAT) 0.4 MG SL tablet 1 TAB UNDER TONGUE AS NEEDED FOR CHEST PAIN. MAY REPEAT EVERY 5 MIN FOR A TOTAL OF 3 DOSES. (Patient taking differently: Place 0.4 mg under the tongue every 5 (five) minutes as needed for chest pain.) 25 tablet 4   oxyCODONE (OXY IR/ROXICODONE) 5 MG immediate release tablet Take 1 tablet (5  mg total) by mouth every 4 (four) hours as needed for breakthrough pain or moderate pain. 20 tablet 0   polyethylene glycol (MIRALAX / GLYCOLAX) 17 g packet Take 17 g by mouth daily. (Patient taking differently: Take 17 g by mouth daily as needed (constipation).) 14 each 0   No facility-administered medications prior to visit.     Allergies:   Other   Social History   Socioeconomic History   Marital status: Married    Spouse name: [name]    Number of children: 0   Years of education: 15 years    Highest education level: Not on file  Occupational History    Employer: [employer]    Comment: customer service [employer]  Tobacco Use   Smoking status: Never   Smokeless tobacco: Never  Vaping Use   Vaping Use: Never used  Substance and Sexual Activity   Alcohol use: Not Currently   Drug use: No   Sexual activity: Not on file  Other Topics Concern   Not on file  Social History Narrative   Not on file   Social Determinants of Health   Financial Resource Strain: Low  Risk    Difficulty of Paying Living Expenses: Not hard at all  Food Insecurity: No Food Insecurity   Worried About Charity fundraiser in the Last Year: Never true   [place] in the Last Year: Never true  Transportation Needs: No Transportation Needs   Lack of Transportation (Medical): No   Lack of Transportation (Non-Medical): No  Physical Activity: Inactive   Days of Exercise per Week: 0 days   Minutes of Exercise per Session: 0 min  Stress: No Stress Concern Present   Feeling of Stress : Not at all  Social Connections: Moderately Integrated   Frequency of Communication with Friends and Family: Once a week   Frequency of Social Gatherings with Friends and Family: Twice a week   Attends Religious Services: 1 to 4 times per year   Active Member of [employer] or Organizations: No   Attends Music therapist: Never   Marital Status: Married     Family History:  The patient's family history includes Alcohol abuse in his father; Breast cancer in his sister; Breast cancer (age of onset: 69) in his mother; CAD in his brother; Cervical cancer in his sister and sister; Heart attack (age of onset: 13) in his father; Liver disease in his father; Prostate cancer (age of onset: 82) in his father.   ROS:   Please see the history of present illness.    All other systems are reviewed and are negative.   PHYSICAL EXAM:   VS:  BP 130/77   Pulse 78   Ht 6' (1.829 m)   Wt 167 lb 6.4 oz (75.9 kg)   SpO2 99%   BMI 22.70 kg/m      General: Alert, oriented x3, no distress, lean Head: no evidence of trauma, PERRL, EOMI, no exophtalmos or lid lag, no myxedema, no xanthelasma; normal ears, nose and oropharynx Neck: normal jugular venous pulsations and no hepatojugular reflux; brisk carotid pulses without delay and no carotid bruits Chest: clear to auscultation, no signs of consolidation by percussion or palpation, normal fremitus, symmetrical and full respiratory  excursions Cardiovascular: normal position and quality of the apical impulse, regular rhythm, normal first and second heart sounds, barely audible systolic ejection murmur, no diastolic murmurs, rubs or gallops Abdomen: no tenderness or distention, no masses by palpation,  no abnormal pulsatility or arterial bruits, normal bowel sounds, no hepatosplenomegaly Extremities: no clubbing, cyanosis or edema; 2+ radial, ulnar and brachial pulses bilaterally; 2+ right femoral, posterior tibial and dorsalis pedis pulses; 2+ left femoral, posterior tibial and dorsalis pedis pulses; no subclavian or femoral bruits Neurological: grossly nonfocal Psych: Normal mood and affect    Wt Readings from Last 3 Encounters:  03/04/21 167 lb 6.4 oz (75.9 kg)  02/26/21 160 lb 6.4 oz (72.8 kg)  02/22/21 160 lb 15 oz (73 kg)      Studies/Labs Reviewed:   ECHO 02/21/2019:   1. Left ventricular ejection fraction, by visual estimation, is 55 to 60%. The left ventricle has normal function. There is mildly increased left ventricular hypertrophy.  2. Left ventricular diastolic parameters are consistent with Grade I diastolic dysfunction (impaired relaxation).  3. Global right ventricle has normal systolic function.The right ventricular size is normal. No increase in right ventricular wall thickness.  4. Left atrial size was normal.  5. Right atrial size was normal.  6. The mitral valve is normal in structure. Trace mitral valve regurgitation. No evidence of mitral stenosis.  7. The tricuspid valve is normal in structure. Tricuspid valve regurgitation is mild.  8. The aortic valve is normal in structure. Aortic valve regurgitation is not visualized. No evidence of aortic valve sclerosis or stenosis.  9. The pulmonic valve was normal in structure. Pulmonic valve regurgitation is not visualized. 10. Mildly elevated pulmonary artery systolic pressure. 11. The inferior vena cava is normal in size with greater than 50%  respiratory variability, suggesting right atrial pressure of 3 mmHg.   EKG:  EKG is not ordered today.  Personally reviewed his EKG from A 02/15/2021 which shows sinus rhythm with long PR interval and left bundle branch block (QRS 120 ms), a single PVC.  TC 468 ms  BMET    Component Value Date/Time   NA 135 02/26/2021 1111   NA 137 04/07/2019 1021   K 4.4 02/26/2021 1111   CL 104 02/26/2021 1111   CO2 24 02/26/2021 1111   GLUCOSE 108 (H) 02/26/2021 1111   BUN 33 (H) 02/26/2021 1111   BUN 29 (H) 04/07/2019 1021   CREATININE 0.96 02/26/2021 1111   CALCIUM 8.4 (L) 02/26/2021 1111   GFRNONAA >60 02/26/2021 1111   GFRAA 43 (L) 01/05/2020 0455   GFRAA >60 11/24/2019 1003   Lipid Panel     Component Value Date/Time   CHOL 136 04/07/2019 1021   TRIG 127 04/07/2019 1021   HDL 42 04/07/2019 1021   CHOLHDL 3.2 04/07/2019 1021   CHOLHDL 5.0 06/01/2015 1202   VLDL 52 (H) 06/01/2015 1202   LDLCALC 71 04/07/2019 1021   12/19/2019 Total cholesterol 142, HDL 51, LDL 60, triglycerides 157  ASSESSMENT:    1. Coronary artery disease involving native coronary artery of native heart without angina pectoris   2. History of aortic valve replacement with bioprosthetic valve   3. Postoperative atrial fibrillation (HCC)   4. LBBB (left bundle branch block)   5. Essential hypertension   6. Hypercholesterolemia   7. Metastatic urothelial carcinoma ([place])   8. H/O left nephrectomy       PLAN:  In order of problems listed above:  [name]: Asymptomatic since bypass surgery. S/P AVR: Barely audible systolic murmur.  Normal function of the aortic valve prosthesis on echo in November 2020. AFib: This occurred only postoperatively after bypass/valve replacement and he has not had a clinical recurrence since. IVCD/1st deg  AVB: His syncopal events have always been associated with hypotension and occurred in setting of dehydration/hypovolemia.  He does have conduction abnormalities and avoid  increasing the dose of beta-blockers or using any other medication with negative chronotropic effect . HTN: Well-controlled.  Option to decrease the beta-blocker further or even stop it gradually, if he continues to lose weight or is dehydrated. HLP: His most recent lipid profile roughly 2 years ago showed an LDL cholesterol of 71 and he has lost a lot of weight since then..  On statin. Left kidney urothelial carcinoma with metastatic disease: Palliative chemotherapy may have caused worse side effects than benefit.  He has a follow-up with his oncologist in a couple of weeks to discuss future plans of care. CKD 2: Recent renal parameters showed substantial improvement from his immediate post nephrectomy values, he has essentially normal kidney function.  Medication Adjustments/Labs and Tests Ordered: Current medicines are reviewed at length with the patient today.  Concerns regarding medicines are outlined above.  Medication changes, Labs and Tests ordered today are listed in the Patient Instructions below. Patient Instructions  Medication Instructions:  No changes *If you need a refill on your cardiac medications before your next appointment, please call your pharmacy*   Lab Work: Your provider would like for you to have the following labs: fasting lipid  If you have labs (blood work) drawn today and your tests are completely normal, you will receive your results only by: [place] (if you have MyChart) OR A paper copy in the mail If you have any lab test that is abnormal or we need to change your treatment, we will call you to review the results.   Testing/Procedures: None ordered   Follow-Up: At [facility], you and your health needs are our priority.  As part of our continuing mission to provide you with exceptional heart care, we have created designated Provider Care Teams.  These Care Teams include your primary Cardiologist (physician) and Advanced Practice Providers (APPs  -  Physician Assistants and Nurse Practitioners) who all work together to provide you with the care you need, when you need it.  We recommend signing up for the patient portal called "MyChart".  Sign up information is provided on this After Visit Summary.  MyChart is used to connect with patients for Virtual Visits (Telemedicine).  Patients are able to view lab/test results, encounter notes, upcoming appointments, etc.  Non-urgent messages can be sent to your provider as well.   To learn more about what you can do with MyChart, go to [email or web address].    Your next appointment:   12 month(s)  The format for your next appointment:   In Person  Provider:   Sanda Klein, MD      Signed, Sanda Klein, MD  03/04/2021 1:01 PM    [facility] [place], [place], [place]  [address] Phone: [phone]; Fax: [phone]

## 2021-03-05 ENCOUNTER — Inpatient Hospital Stay: Payer: PPO

## 2021-03-05 ENCOUNTER — Encounter: Payer: Self-pay | Admitting: Oncology

## 2021-03-05 VITALS — BP 128/67 | HR 87 | Temp 98.2°F | Resp 18

## 2021-03-05 DIAGNOSIS — Z95828 Presence of other vascular implants and grafts: Secondary | ICD-10-CM

## 2021-03-05 DIAGNOSIS — C688 Malignant neoplasm of overlapping sites of urinary organs: Secondary | ICD-10-CM | POA: Diagnosis not present

## 2021-03-05 DIAGNOSIS — D649 Anemia, unspecified: Secondary | ICD-10-CM

## 2021-03-05 DIAGNOSIS — C679 Malignant neoplasm of bladder, unspecified: Secondary | ICD-10-CM

## 2021-03-05 LAB — CMP (CANCER CENTER ONLY)
ALT: 39 U/L (ref 0–44)
AST: 38 U/L (ref 15–41)
Albumin: 2.8 g/dL — ABNORMAL LOW (ref 3.5–5.0)
Alkaline Phosphatase: 129 U/L — ABNORMAL HIGH (ref 38–126)
Anion gap: 8 (ref 5–15)
BUN: 35 mg/dL — ABNORMAL HIGH (ref 8–23)
CO2: 24 mmol/L (ref 22–32)
Calcium: 8.1 mg/dL — ABNORMAL LOW (ref 8.9–10.3)
Chloride: 104 mmol/L (ref 98–111)
Creatinine: 1.13 mg/dL (ref 0.61–1.24)
GFR, Estimated: >60 mL/min (ref >60)
Glucose, Bld: 97 mg/dL (ref 70–99)
Potassium: 4.3 mmol/L (ref 3.5–5.1)
Sodium: 136 mmol/L (ref 135–145)
Total Bilirubin: 0.7 mg/dL (ref 0.3–1.2)
Total Protein: 6 g/dL — ABNORMAL LOW (ref 6.5–8.1)

## 2021-03-05 LAB — CBC WITH DIFFERENTIAL (CANCER CENTER ONLY)
Abs Immature Granulocytes: 0.73 10*3/uL — ABNORMAL HIGH (ref 0.00–0.07)
Basophils Absolute: 0.1 10*3/uL (ref 0.0–0.1)
Basophils Relative: 1 %
Eosinophils Absolute: 0.1 10*3/uL (ref 0.0–0.5)
Eosinophils Relative: 0 %
HCT: 27.8 % — ABNORMAL LOW (ref 39.0–52.0)
Hemoglobin: 8.8 g/dL — ABNORMAL LOW (ref 13.0–17.0)
Immature Granulocytes: 5 %
Lymphocytes Relative: 10 %
Lymphs Abs: 1.4 10*3/uL (ref 0.7–4.0)
MCH: 27.2 pg (ref 26.0–34.0)
MCHC: 31.7 g/dL (ref 30.0–36.0)
MCV: 85.8 fL (ref 80.0–100.0)
Monocytes Absolute: 1 10*3/uL (ref 0.1–1.0)
Monocytes Relative: 7 %
Neutro Abs: 10.4 10*3/uL — ABNORMAL HIGH (ref 1.7–7.7)
Neutrophils Relative %: 77 %
Platelet Count: 228 10*3/uL (ref 150–400)
RBC: 3.24 MIL/uL — ABNORMAL LOW (ref 4.22–5.81)
RDW: 19.9 % — ABNORMAL HIGH (ref 11.5–15.5)
WBC Count: 13.7 10*3/uL — ABNORMAL HIGH (ref 4.0–10.5)
nRBC: 0.1 % (ref 0.0–0.2)

## 2021-03-05 LAB — SAMPLE TO BLOOD BANK

## 2021-03-05 LAB — TSH: TSH: 2.892 u[IU]/mL (ref 0.320–4.118)

## 2021-03-05 MED ORDER — HEPARIN SOD (PORK) LOCK FLUSH 100 UNIT/ML IV SOLN
500.0000 [IU] | Freq: Once | INTRAVENOUS | Status: AC | PRN
  Administered 2021-03-05: 500 [IU]

## 2021-03-05 MED ORDER — SODIUM CHLORIDE 0.9 % IV SOLN
Freq: Once | INTRAVENOUS | Status: AC
Start: 2021-03-05 — End: 2021-03-05

## 2021-03-05 MED ORDER — SODIUM CHLORIDE 0.9% FLUSH: 10.0000 mL | Freq: Once | INTRAVENOUS | Status: DC

## 2021-03-05 MED ORDER — SODIUM CHLORIDE 0.9% FLUSH
10.0000 mL | Freq: Once | INTRAVENOUS | Status: AC | PRN
  Administered 2021-03-05: 10 mL

## 2021-03-05 NOTE — Patient Instructions (Signed)

## 2021-03-10 ENCOUNTER — Telehealth: Payer: Self-pay | Admitting: Pharmacist

## 2021-03-10 NOTE — Chronic Care Management (AMB) (Signed)
Chronic Care Management Pharmacy Assistant   Name: Lawrence Hall  MRN: [id number] DOB: 04-Aug-1945  Reason for Encounter: Disease State / Hypertension and Hyperlipidemia Assessment Call   Conditions to be addressed/monitored: HTN and HLD  Recent office visits:  02/03/2021 Lawrence Rough MD - Patient was seen for vomiting unspecified and additional issues. Started Mirtazapine 15 mg  daily and discontinued Megestrol. Follow up in 2 weeks.  Recent consult visits:  03/04/2021 Lawrence Klein MD (cardiology) - Patient was seen for coronary artery disease and additional issues. No medication changes. Follow up in 12 months.  02/26/2021 Lawrence Portela MD (oncology) - Patient was seen for Anemia and an additional issue. No medication changes. Follow up in 4 weeks.  02/05/2021 Lawrence Portela MD (oncology) - Patient was seen for Malignant tumor of renal pelvis. No medication changes. No follow up noted.  Hospital visits: Admitted to [facility] on 02/15/2021 due to Syncope. Discharge date was 02/22/2021. Discharged from [facility]?Medications Started at [facility] Discharge:?? -started Dexamethasone 4 mg daily Medication Changes at Hospital Discharge: -None Medications Discontinued at Hospital Discharge: -Stopped Dilaudid and Compazine?  -All other medications will remain the same.     Admitted to [facility] on 01/23/2021 due to Intractable vomiting with nausea. Discharge date was 01/26/2021. Discharged from [place]?Medications Started at [facility] Discharge:?? -None  Medication Changes at Hospital Discharge: -None Medications Discontinued at Hospital Discharge: -None  -All other medications will remain the same.   Medications: Outpatient Encounter Medications as of 03/10/2021  Medication Sig   aspirin EC 81 MG tablet Take 1 tablet (81 mg total) by mouth daily. Swallow whole. (Patient  taking differently: Take 81 mg by mouth at bedtime. Swallow whole.)   atorvastatin (LIPITOR) 80 MG tablet TAKE 1 TABLET ONCE DAILY AT 6 PM. (Patient taking differently: Take 80 mg by mouth daily at 6 PM.)   Cholecalciferol (VITAMIN D) 50 MCG (2000 UT) tablet Take 2,000 Units by mouth daily with lunch.   dexamethasone (DECADRON) 4 MG tablet Take 1 tablet (4 mg total) by mouth daily.   metoprolol succinate (TOPROL-XL) 50 MG 24 hr tablet Take 1 tablet (50 mg total) by mouth at bedtime.   Milk Thistle 1000 MG CAPS Take 1,000 mg by mouth every other day.   mirtazapine (REMERON) 15 MG tablet Take 1 tablet (15 mg total) by mouth at bedtime.   nitroGLYCERIN (NITROSTAT) 0.4 MG SL tablet 1 TAB UNDER TONGUE AS NEEDED FOR CHEST PAIN. MAY REPEAT EVERY 5 MIN FOR A TOTAL OF 3 DOSES. (Patient taking differently: Place 0.4 mg under the tongue every 5 (five) minutes as needed for chest pain.)   oxyCODONE (OXY IR/ROXICODONE) 5 MG immediate release tablet Take 1 tablet (5 mg total) by mouth every 4 (four) hours as needed for breakthrough pain or moderate pain.   polyethylene glycol (MIRALAX / GLYCOLAX) 17 g packet Take 17 g by mouth daily. (Patient taking differently: Take 17 g by mouth daily as needed (constipation).)   No facility-administered encounter medications on file as of 03/10/2021.   Fill History: atorvastatin 80 mg tablet 02/06/2021 90   dexamethasone 4 mg tablet 02/22/2021 30   metoprolol succinate ER 50 mg tablet,extended release 24 hr 01/30/2021 90   mirtazapine 15 mg tablet 02/03/2021 90  Reviewed chart prior to disease state call. Spoke with patient regarding BP  Recent Office Vitals: BP Readings from Last 3  Encounters:  03/05/21 128/67  03/04/21 130/77  02/26/21 135/71   Pulse Readings from Last 3 Encounters:  03/05/21 87  03/04/21 78  02/26/21 88    Wt Readings from Last 3 Encounters:  03/04/21 167 lb 6.4 oz (75.9 kg)  02/26/21 160 lb 6.4 oz (72.8 kg)  02/22/21 160 lb 15 oz (73  kg)     Kidney Function Lab Results  Component Value Date/Time   CREATININE 1.13 03/05/2021 02:05 PM   CREATININE 0.96 02/26/2021 11:11 AM   GFR 49.84 (L) 11/22/2020 11:21 AM   GFRNONAA >60 03/05/2021 02:05 PM   GFRAA 43 (L) 01/05/2020 04:55 AM   GFRAA >60 11/24/2019 10:03 AM    BMP Latest Ref Rng & Units 03/05/2021 02/26/2021 02/19/2021  Glucose 70 - 99 mg/dL 97 108(H) 100(H)  BUN 8 - 23 mg/dL 35(H) 33(H) 16  Creatinine 0.61 - 1.24 mg/dL 1.13 0.96 1.09  BUN/Creat Ratio 10 - 24 - - -  Sodium 135 - 145 mmol/L 136 135 132(L)  Potassium 3.5 - 5.1 mmol/L 4.3 4.4 4.0  Chloride 98 - 111 mmol/L 104 104 101  CO2 22 - 32 mmol/L 24 24 24   Calcium 8.9 - 10.3 mg/dL 8.1(L) 8.4(L) 8.2(L)   SCHEDULE FOLLOW UP (set for 07/07/21 at 3:00)  Current antihypertensive regimen:  Metoprolol 50 mg once daily  How often are you checking your Blood Pressure?   Current home BP readings:   What recent interventions/DTPs have been made by any provider to improve Blood Pressure control since last CPP Visit: None  Any recent hospitalizations or ED visits since last visit with CPP? Yes  What diet changes have been made to improve Blood Pressure Control?    What exercise is being done to improve your Blood Pressure Control?    Adherence Review: Is the patient currently on ACE/ARB medication? No Does the patient have >5 day gap between last estimated fill dates? No  03/10/2021 Name: Lawrence Hall MRN: [id number] DOB: 05-15-1945 Lawrence Hall is a 75 y.o. year old male who is a primary care patient of [name], [name], MD.  Comprehensive medication review performed; Spoke to patient regarding cholesterol  Lipid Panel    Component Value Date/Time   CHOL 136 04/07/2019 1021   TRIG 127 04/07/2019 1021   HDL 42 04/07/2019 1021   [place] 71 04/07/2019 1021    10-year ASCVD risk score: The ASCVD Risk score (Arnett DK, et al., 2019) failed to calculate for the following reasons:   The patient has a  prior MI or stroke diagnosis  Current antihyperlipidemic regimen:  Atorvastatin 80 mg daily   Previous antihyperlipidemic medications tried: Simvastatin  ASCVD risk enhancing conditions: age >77 and HTN  What recent interventions/DTPs have been made by any provider to improve Cholesterol control since last CPP Visit: None  Any recent hospitalizations or ED visits since last visit with CPP? Yes   Adherence Review: Does the patient have >5 day gap between last estimated fill dates? No  Unable to reach patient after several attempts.  Scheduled appointment, left message with date and time, also notified wife yesterday.  Patients wife was unable to answer any questions yesterday and said to call again today.  Care Gaps: AWV - previous message sent to Ramond Craver to schedule Last BP - 130/77 on 03/04/2021  Star Rating Drugs: Atorvastatin 80mg  - last filled 02/06/2021 90DS at [facility] Pharmacist Assistant [phone]

## 2021-03-12 ENCOUNTER — Inpatient Hospital Stay: Payer: PPO

## 2021-03-12 ENCOUNTER — Other Ambulatory Visit: Payer: Self-pay | Admitting: Internal Medicine

## 2021-03-12 ENCOUNTER — Other Ambulatory Visit: Payer: Self-pay

## 2021-03-12 VITALS — BP 132/75 | HR 60 | Resp 18

## 2021-03-12 DIAGNOSIS — Z95828 Presence of other vascular implants and grafts: Secondary | ICD-10-CM

## 2021-03-12 DIAGNOSIS — C688 Malignant neoplasm of overlapping sites of urinary organs: Secondary | ICD-10-CM | POA: Diagnosis not present

## 2021-03-12 DIAGNOSIS — C679 Malignant neoplasm of bladder, unspecified: Secondary | ICD-10-CM

## 2021-03-12 DIAGNOSIS — D649 Anemia, unspecified: Secondary | ICD-10-CM

## 2021-03-12 LAB — CBC WITH DIFFERENTIAL (CANCER CENTER ONLY)
Abs Immature Granulocytes: 0.08 10*3/uL — ABNORMAL HIGH (ref 0.00–0.07)
Basophils Absolute: 0 10*3/uL (ref 0.0–0.1)
Basophils Relative: 0 %
Eosinophils Absolute: 0 10*3/uL (ref 0.0–0.5)
Eosinophils Relative: 0 %
HCT: 23.7 % — ABNORMAL LOW (ref 39.0–52.0)
Hemoglobin: 7.6 g/dL — ABNORMAL LOW (ref 13.0–17.0)
Immature Granulocytes: 1 %
Lymphocytes Relative: 6 %
Lymphs Abs: 0.8 10*3/uL (ref 0.7–4.0)
MCH: 26.8 pg (ref 26.0–34.0)
MCHC: 32.1 g/dL (ref 30.0–36.0)
MCV: 83.5 fL (ref 80.0–100.0)
Monocytes Absolute: 0.5 10*3/uL (ref 0.1–1.0)
Monocytes Relative: 5 %
Neutro Abs: 10.3 10*3/uL — ABNORMAL HIGH (ref 1.7–7.7)
Neutrophils Relative %: 88 %
Platelet Count: 233 10*3/uL (ref 150–400)
RBC: 2.84 MIL/uL — ABNORMAL LOW (ref 4.22–5.81)
RDW: 18.8 % — ABNORMAL HIGH (ref 11.5–15.5)
WBC Count: 11.6 10*3/uL — ABNORMAL HIGH (ref 4.0–10.5)
nRBC: 0 % (ref 0.0–0.2)

## 2021-03-12 LAB — CMP (CANCER CENTER ONLY)
ALT: 18 U/L (ref 0–44)
AST: 19 U/L (ref 15–41)
Albumin: 2.3 g/dL — ABNORMAL LOW (ref 3.5–5.0)
Alkaline Phosphatase: 113 U/L (ref 38–126)
Anion gap: 11 (ref 5–15)
BUN: 51 mg/dL — ABNORMAL HIGH (ref 8–23)
CO2: 20 mmol/L — ABNORMAL LOW (ref 22–32)
Calcium: 8.6 mg/dL — ABNORMAL LOW (ref 8.9–10.3)
Chloride: 104 mmol/L (ref 98–111)
Creatinine: 1.27 mg/dL — ABNORMAL HIGH (ref 0.61–1.24)
GFR, Estimated: 58 mL/min — ABNORMAL LOW (ref >60)
Glucose, Bld: 156 mg/dL — ABNORMAL HIGH (ref 70–99)
Potassium: 4.5 mmol/L (ref 3.5–5.1)
Sodium: 135 mmol/L (ref 135–145)
Total Bilirubin: 1.4 mg/dL — ABNORMAL HIGH (ref 0.3–1.2)
Total Protein: 6.5 g/dL (ref 6.5–8.1)

## 2021-03-12 LAB — SAMPLE TO BLOOD BANK

## 2021-03-12 MED ORDER — ONDANSETRON HCL 4 MG/2ML IJ SOLN
8.0000 mg | Freq: Once | INTRAMUSCULAR | Status: AC
  Administered 2021-03-12: 8 mg via INTRAVENOUS
  Filled 2021-03-12: qty 4

## 2021-03-12 MED ORDER — SODIUM CHLORIDE 0.9% FLUSH
10.0000 mL | Freq: Once | INTRAVENOUS | Status: AC
  Administered 2021-03-12: 10 mL

## 2021-03-12 MED ORDER — HEPARIN SOD (PORK) LOCK FLUSH 100 UNIT/ML IV SOLN
500.0000 [IU] | Freq: Once | INTRAVENOUS | Status: AC | PRN
  Administered 2021-03-12: 500 [IU]

## 2021-03-12 MED ORDER — SODIUM CHLORIDE 0.9 % IV SOLN
Freq: Once | INTRAVENOUS | Status: AC
Start: 2021-03-12 — End: 2021-03-12

## 2021-03-12 MED ORDER — SODIUM CHLORIDE 0.9 % IV SOLN: Freq: Once | INTRAVENOUS | Status: DC

## 2021-03-12 MED ORDER — SODIUM CHLORIDE 0.9% FLUSH
10.0000 mL | Freq: Once | INTRAVENOUS | Status: AC | PRN
  Administered 2021-03-12: 10 mL

## 2021-03-12 NOTE — Progress Notes (Signed)
HGB 7.6.  Per Dr. Julien Nordmann, MD will address at next appt next week.

## 2021-03-12 NOTE — Patient Instructions (Signed)

## 2021-03-14 LAB — TSH: TSH: 2.4 u[IU]/mL (ref 0.320–4.118)

## 2021-03-15 ENCOUNTER — Encounter: Payer: Self-pay | Admitting: Family Medicine

## 2021-03-16 ENCOUNTER — Encounter: Payer: Self-pay | Admitting: Family Medicine

## 2021-03-17 ENCOUNTER — Telehealth: Payer: Self-pay | Admitting: Family Medicine

## 2021-03-17 ENCOUNTER — Ambulatory Visit: Payer: PPO | Admitting: Family Medicine

## 2021-03-17 NOTE — Telephone Encounter (Signed)
Left a message for the patient to return my call.  

## 2021-03-17 NOTE — Telephone Encounter (Signed)
Patient had an appointment scheduled for today but canceled it due to being sick. Patient is now requesting a phone call back from Wendie Simmer or [place].  Patient could be contacted at [phone].   Please advise.

## 2021-03-17 NOTE — Telephone Encounter (Signed)
Patient called back and stated he was hoping to be seen later today as he could not come in earlier at the 8am appt he was scheduled for.  Patient was advised PCP did not have any openings available and was reminded of the appt he has on 12/5 to arrive at 10:15am.

## 2021-03-17 NOTE — Progress Notes (Deleted)
  Lawrence Hall DOB: 1945/07/02 Encounter date: 03/17/2021  This is a 75 y.o. male who presents with No chief complaint on file.   History of present illness: Last visit with oncology was 02/26/21. Patient was hospitalized 10/29-11/5 for failure to thrive. Was discharged with hospice care. Per oncology notes had improvement in performance status and quality of life. Started on dexamethasine which helped symptoms. Eating better, although still decreased. 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 Discussed performance and overall health decline with palliative chemo; discussed resuming chemo versus continuing with hospice care. They are planning to resume IV hydration weekly for him. Last hemoglobin was lower and there are notes that this will be addressed at office visit this week.   Saw cardiology on 11/15: suggested monitoring bp with options to decrease beta blocker or stop if losing weight/dehydrated.  HPI   Allergies  Allergen Reactions   Other Itching and Other (See Comments)    Pet dander, seasonal allergies = Runny nose, itchy eyes   No outpatient medications have been marked as taking for the 03/17/21 encounter (Appointment) with [name], MD.    Review of Systems  Objective:  There were no vitals taken for this visit.      BP Readings from Last 3 Encounters:  03/12/21 132/75  03/05/21 128/67  03/04/21 130/77   Wt Readings from Last 3 Encounters:  03/04/21 167 lb 6.4 oz (75.9 kg)  02/26/21 160 lb 6.4 oz (72.8 kg)  02/22/21 160 lb 15 oz (73 kg)    Physical Exam  Assessment/Plan  There are no diagnoses linked to this encounter.       [name], MD

## 2021-03-19 ENCOUNTER — Other Ambulatory Visit: Payer: Self-pay

## 2021-03-19 ENCOUNTER — Inpatient Hospital Stay: Payer: PPO

## 2021-03-19 ENCOUNTER — Inpatient Hospital Stay: Payer: PPO | Admitting: Dietician

## 2021-03-19 VITALS — BP 126/68 | HR 67 | Temp 97.7°F | Resp 16

## 2021-03-19 DIAGNOSIS — Z95828 Presence of other vascular implants and grafts: Secondary | ICD-10-CM

## 2021-03-19 DIAGNOSIS — C688 Malignant neoplasm of overlapping sites of urinary organs: Secondary | ICD-10-CM | POA: Diagnosis not present

## 2021-03-19 MED ORDER — ONDANSETRON HCL 4 MG/2ML IJ SOLN
8.0000 mg | Freq: Once | INTRAMUSCULAR | Status: AC
  Administered 2021-03-19: 8 mg via INTRAVENOUS
  Filled 2021-03-19: qty 4

## 2021-03-19 MED ORDER — SODIUM CHLORIDE 0.9 % IV SOLN: Freq: Once | INTRAVENOUS | Status: DC

## 2021-03-19 MED ORDER — SODIUM CHLORIDE 0.9 % IV SOLN: Freq: Once | INTRAVENOUS | Status: AC

## 2021-03-19 NOTE — Patient Instructions (Signed)

## 2021-03-20 ENCOUNTER — Encounter: Payer: Self-pay | Admitting: Oncology

## 2021-03-20 NOTE — Progress Notes (Signed)
Nutrition Assessment   Reason for Assessment: Hospital f/u   ASSESSMENT: 75 year old male with stage IV urothelial carcinoma with hepatic and osseous mets s/p radical resection to right humerus. Patient completed radiation therapy to right humerus in July 2022. Patient started [facility] 02/12/21. Chemotherapy currently on hold. He is receiving weekly IV hydration.   Past medical history includes [name], moderate AAS s/p AVR, first degree AV block, anemia of chronic disease, HTN, HLD. Patient recently hospitalized 10/29-11/5 with failure to thrive.   Met with patient during infusion. He reports feeling week. Patient reports he was given steroids during admission, reports having increased energy and appetite. Patient reports he abruptly stopped taking this on his own after discharge and felt terrible. Patient reports appetite is improving now that he has resumed appetite stimulant (remeron). He is drinking 1-2 Ensure Original day. Patient was given Ensure Enlive during admission which he liked, but can not find these in stores. Patient reports he craves spaghetti and meatballs as well as grapes. Patient drinks ~20 ounces of water/day.   Nutrition Focused Physical Exam:  Severe fat depletion to orbital, buccal, thoracic regions Severe muscle depletion to temple, clavicle, clavicle and acromion, dorsal hand regions   Medications: Vit D, milk thistle, remeron, oxycodone, miralax,    Labs: 11/23 - glucose 156, BUN 51, Cr 1.27  Anthropometrics: Weights have decreased 43 lbs (20%) from usual weight in 6 months; significant  Height: 6' Weight: 167 lb 6.4 oz (03/04/21) UBW: 210 lb (08/21/20) BMI: 22.7    NUTRITION DIAGNOSIS:  Severe malnutrition continues    INTERVENTION:  Educated on strategies to increase calories and protein with small frequent meals and snacks  Encouraged high calorie high protein foods to promote weight gain - handout with snack ideas provided Recommend patient drink  3-4 Ensure day and suggested switching to Ensure Plus/equivalent for added calories and protein - coupons provided  Continue taking appetite stimulant Discussed strategies to increase daily fluid intake - handout with tips provided Contact information provided    MONITORING, EVALUATION, GOAL: Patient will tolerate increased calories and protein to promote weight gain   Next Visit: To be scheduled

## 2021-03-21 ENCOUNTER — Other Ambulatory Visit: Payer: Self-pay

## 2021-03-21 ENCOUNTER — Ambulatory Visit (HOSPITAL_COMMUNITY)
Admission: RE | Admit: 2021-03-21 | Discharge: 2021-03-21 | Disposition: A | Discharge status: Home / Self Care | Payer: PPO | Source: Ambulatory Visit | Attending: Oncology | Admitting: Oncology

## 2021-03-21 DIAGNOSIS — J9811 Atelectasis: Secondary | ICD-10-CM | POA: Diagnosis not present

## 2021-03-21 DIAGNOSIS — K828 Other specified diseases of gallbladder: Secondary | ICD-10-CM | POA: Diagnosis not present

## 2021-03-21 DIAGNOSIS — C652 Malignant neoplasm of left renal pelvis: Secondary | ICD-10-CM | POA: Diagnosis not present

## 2021-03-21 DIAGNOSIS — C649 Malignant neoplasm of unspecified kidney, except renal pelvis: Secondary | ICD-10-CM | POA: Diagnosis not present

## 2021-03-21 DIAGNOSIS — C787 Secondary malignant neoplasm of liver and intrahepatic bile duct: Secondary | ICD-10-CM | POA: Diagnosis not present

## 2021-03-21 DIAGNOSIS — J90 Pleural effusion, not elsewhere classified: Secondary | ICD-10-CM | POA: Diagnosis not present

## 2021-03-21 DIAGNOSIS — I700 Atherosclerosis of aorta: Secondary | ICD-10-CM | POA: Diagnosis not present

## 2021-03-21 DIAGNOSIS — K8020 Calculus of gallbladder without cholecystitis without obstruction: Secondary | ICD-10-CM | POA: Diagnosis not present

## 2021-03-21 DIAGNOSIS — K7689 Other specified diseases of liver: Secondary | ICD-10-CM | POA: Diagnosis not present

## 2021-03-24 ENCOUNTER — Ambulatory Visit (INDEPENDENT_AMBULATORY_CARE_PROVIDER_SITE_OTHER): Payer: PPO | Admitting: Family Medicine

## 2021-03-24 ENCOUNTER — Encounter: Payer: Self-pay | Admitting: Family Medicine

## 2021-03-24 VITALS — BP 122/58 | HR 76 | Temp 97.6°F | Ht 72.0 in | Wt 155.6 lb

## 2021-03-24 DIAGNOSIS — F321 Major depressive disorder, single episode, moderate: Secondary | ICD-10-CM

## 2021-03-24 DIAGNOSIS — E43 Unspecified severe protein-calorie malnutrition: Secondary | ICD-10-CM | POA: Diagnosis not present

## 2021-03-24 DIAGNOSIS — E860 Dehydration: Secondary | ICD-10-CM | POA: Diagnosis not present

## 2021-03-24 DIAGNOSIS — C689 Malignant neoplasm of urinary organ, unspecified: Secondary | ICD-10-CM

## 2021-03-24 DIAGNOSIS — C7951 Secondary malignant neoplasm of bone: Secondary | ICD-10-CM | POA: Diagnosis not present

## 2021-03-24 NOTE — Progress Notes (Signed)
Lawrence Hall DOB: [birth year] Encounter date: 03/24/2021  This is a 75 y.o. male who presents with Chief Complaint  Patient presents with   Follow-up     History of present illness:  Saw dietician 11/30: severe malnutrition; working on 2 ensure daily, good hydration, weekly IV hydration. Per patient, he is not as good as he feels he should be with fluids. There are many days per patient/wife that he doesn't finish one ensure and eating is poor otherwise. Tends to use for meal rather than additional calories.   Last oncology visit was 02/26/21. Stage IV high grade urothelial carcinoma renal pelvis with hepatic/bone involvement. Documented poor prognosis with limited life expectancy. Continue with IV weekly hydration. Chemo on hold as palliative treatments did not impact prognosis and quality of life significantly decreased with treatment. Has followup in 2 days. Had CT completed 48 hours ago; waiting to review with oncology.  Per patient: Seeing oncologist in 2 days. They will make decision to start on new chemo (did just one tx so far). Basing next step on this discussion/CT results. Choice - restart chemo or continue with weekly hydration. He is still feeling very weak. Reiterates imaging in October when he found out that cancer was worse. Also other difficulties with dehydration, weakness. Given dexadron out of hospital which helped a week. Went to hydration session that was supposed to last 3 hours, but lasted 5 hours and he didn't eat during this, then was afraid to take the dexadron and stopped it suddenly, which really set him back. Right now feeling weak and he doesn't want to start any treatments until he is stronger. Would like to work on building back up with strength. Does feel like he is doing a better job with maintaining weight at this point.   Does feel more stomach sensitivity - ie hot/cold. Bowels are pretty regular with gummy metamucil.   Mood: a little down with all the ups and  downs. Per wife getting upset with small things; angers easily. He feels tired. He is sleeping ok. He feels that mood is slightly better now with remeron.   Wife worries about appetite. He feels like he is eating ok, esp since being off decadron. He states he tries to drink an ensure a day; but wife states not finishing one daily. She worries about what to get him and whether there is something that can be given through IV.   He does not feel that visit he had/conversations with palliative care were a positive experience. He states that it is his desire to go through some weeks of iv hydration and then hopes to take more chemo treatments. He does not want to be on any steroids again if he is able to avoid this. He wants to talk with oncology and come up with plan through them rather than following with hospice care.    Allergies  Allergen Reactions   Other Itching and Other (See Comments)    Pet dander, seasonal allergies = Runny nose, itchy eyes   Current Meds  Medication Sig   aspirin EC 81 MG tablet Take 1 tablet (81 mg total) by mouth daily. Swallow whole. (Patient taking differently: Take 81 mg by mouth at bedtime. Swallow whole.)   atorvastatin (LIPITOR) 80 MG tablet TAKE 1 TABLET ONCE DAILY AT 6 PM. (Patient taking differently: Take 80 mg by mouth daily at 6 PM.)   Cholecalciferol (VITAMIN D) 50 MCG (2000 UT) tablet Take 2,000 Units by mouth daily with lunch.  metoprolol succinate (TOPROL-XL) 50 MG 24 hr tablet Take 1 tablet (50 mg total) by mouth at bedtime.   Milk Thistle 1000 MG CAPS Take 1,000 mg by mouth every other day.   mirtazapine (REMERON) 15 MG tablet Take 1 tablet (15 mg total) by mouth at bedtime.   nitroGLYCERIN (NITROSTAT) 0.4 MG SL tablet 1 TAB UNDER TONGUE AS NEEDED FOR CHEST PAIN. MAY REPEAT EVERY 5 MIN FOR A TOTAL OF 3 DOSES. (Patient taking differently: Place 0.4 mg under the tongue every 5 (five) minutes as needed for chest pain.)   oxyCODONE (OXY IR/ROXICODONE) 5 MG  immediate release tablet Take 1 tablet (5 mg total) by mouth every 4 (four) hours as needed for breakthrough pain or moderate pain.   polyethylene glycol (MIRALAX / GLYCOLAX) 17 g packet Take 17 g by mouth daily. (Patient taking differently: Take 17 g by mouth daily as needed (constipation).)    Review of Systems  Constitutional:  Positive for appetite change and fatigue. Negative for chills and fever.  Respiratory:  Negative for cough, chest tightness, shortness of breath (wife feels like he has episodes of breathing faster; patient denies) and wheezing.   Cardiovascular:  Negative for chest pain, palpitations and leg swelling.  Gastrointestinal:  Negative for abdominal pain (no pain; states an occasional upset stomach), constipation, diarrhea, nausea and vomiting.  Genitourinary:  Negative for difficulty urinating and dysuria.   Objective:  BP (!) 122/58 (BP Location: Left Arm, Patient Position: Sitting, Cuff Size: Normal)   Pulse 76   Temp 97.6 F (36.4 C) (Oral)   Ht 6' (1.829 m)   Wt 155 lb 9.6 oz (70.6 kg)   SpO2 97%   BMI 21.10 kg/m   Weight: 155 lb 9.6 oz (70.6 kg)   BP Readings from Last 3 Encounters:  03/24/21 (!) 122/58  03/19/21 126/68  03/12/21 132/75   Wt Readings from Last 3 Encounters:  03/24/21 155 lb 9.6 oz (70.6 kg)  03/04/21 167 lb 6.4 oz (75.9 kg)  02/26/21 160 lb 6.4 oz (72.8 kg)    Physical Exam Constitutional:      Appearance: He is ill-appearing.  Pulmonary:     Effort: Pulmonary effort is normal.     Breath sounds: Examination of the right-lower field reveals decreased breath sounds. Examination of the left-lower field reveals decreased breath sounds. Decreased breath sounds present. No wheezing or rhonchi.  Skin:    Coloration: Skin is pale and sallow.  Neurological:     Comments: 4+ strength in all extremities  Psychiatric:        Attention and Perception: Attention normal.        Mood and Affect: Mood is depressed. Affect is angry.         Speech: Speech normal.        Behavior: Behavior is agitated (frustration towards wife; esp with discussion of appetite/eating).    Assessment/Plan  1. Urothelial cancer [facility]) He has follow up with oncology later this week. He plans to discuss goals of care, follow up treatment at that time. I have reached out to the dietician through oncology that saw him. I think it would be helpful for his wife to be included in visits.   Anemia was worse on last bloodwork. He will follow up with oncology to address. Outside of fatigue, no acute sx of anemia.  2. Metastasis to bone ([place]) See above.  3. Dehydration He is going to work on fluids with electrolytes. See suggested message to him below.  4. Protein-calorie malnutrition, severe Make all meals count. Discussed ways to boost calories/protein intake. See below. Continue with dietician to help with additional ideas.   5. Depression: we discussed mood. He feels that as he feels better mood tends to be better. Discussed room for adjustment with remeron, but for now will keep the same. Encouraged him to reach out with needs/concerns.    For staying hydrated: first of all; I think going with what tastes best to you is important. If you don't like the taste, then you just won't drink as much. I agree that the pedialyte is good if you are ok with taste, but a couple other options that may be good for electrolyte balance and rehydration (and may be cheaper)  - liquid IV (can get on amazon; includes some other vitamins), skratch hydration mix. Both of these are available on [place] In terms of bars- same sort of idea with finding something that you enjoy the taste of. One of the higher protein bars is Met -rx (you can get these at grocery or [employer]), but also something like the "lenny and larry's cookies" - they taste ok and have a decent protein amount, nuts n more (spreadable protein). If you can't stomach much food; having a little cliff mini bar (or  even a more palatable bar like special K protein which may taste a little more appetizing) could work. If you are a milk drinker, stick with higher protein milks like fairlife. If you like smoothies - this can be an easy way to pack in vitamins, calories, and protein.   I will also touch base with the dietician that you met with to see what else she might suggest; especially if some of the tastes don't appeal to you.   Return if symptoms worsen or fail to improve. 45 minutes spent in chart review, discussion of nutrition concerns, options for rehydration/higher protein/cal additions to diet. Reached out to both oncology and nutrition regarding follow up.   [name], MD

## 2021-03-24 NOTE — Patient Instructions (Addendum)
Try to include higher protein contents to maximize calories/protein within diet. Work on ensure between meals.   Check for protein bars in the pharmacy area of grocery store - looking for some higher protein content.   I'll be in touch with you as well with some other tips and reach out to your dietician too.

## 2021-03-26 ENCOUNTER — Encounter: Payer: Self-pay | Admitting: General Practice

## 2021-03-26 ENCOUNTER — Inpatient Hospital Stay: Payer: PPO | Attending: Oncology

## 2021-03-26 ENCOUNTER — Inpatient Hospital Stay (HOSPITAL_BASED_OUTPATIENT_CLINIC_OR_DEPARTMENT_OTHER): Payer: PPO | Admitting: Oncology

## 2021-03-26 ENCOUNTER — Inpatient Hospital Stay: Payer: PPO

## 2021-03-26 ENCOUNTER — Other Ambulatory Visit: Payer: Self-pay

## 2021-03-26 ENCOUNTER — Other Ambulatory Visit: Payer: Self-pay | Admitting: *Deleted

## 2021-03-26 VITALS — BP 138/98 | HR 102 | Temp 97.8°F | Resp 17 | Ht 72.0 in | Wt 154.3 lb

## 2021-03-26 DIAGNOSIS — Z95828 Presence of other vascular implants and grafts: Secondary | ICD-10-CM

## 2021-03-26 DIAGNOSIS — C7951 Secondary malignant neoplasm of bone: Secondary | ICD-10-CM | POA: Insufficient documentation

## 2021-03-26 DIAGNOSIS — C787 Secondary malignant neoplasm of liver and intrahepatic bile duct: Secondary | ICD-10-CM | POA: Diagnosis not present

## 2021-03-26 DIAGNOSIS — R627 Adult failure to thrive: Secondary | ICD-10-CM | POA: Insufficient documentation

## 2021-03-26 DIAGNOSIS — D649 Anemia, unspecified: Secondary | ICD-10-CM

## 2021-03-26 DIAGNOSIS — C679 Malignant neoplasm of bladder, unspecified: Secondary | ICD-10-CM | POA: Diagnosis not present

## 2021-03-26 LAB — CBC WITH DIFFERENTIAL (CANCER CENTER ONLY)
Abs Immature Granulocytes: 0.02 10*3/uL (ref 0.00–0.07)
Basophils Absolute: 0 10*3/uL (ref 0.0–0.1)
Basophils Relative: 1 %
Eosinophils Absolute: 0.1 10*3/uL (ref 0.0–0.5)
Eosinophils Relative: 1 %
HCT: 26.4 % — ABNORMAL LOW (ref 39.0–52.0)
Hemoglobin: 8.5 g/dL — ABNORMAL LOW (ref 13.0–17.0)
Immature Granulocytes: 0 %
Lymphocytes Relative: 14 %
Lymphs Abs: 0.8 10*3/uL (ref 0.7–4.0)
MCH: 26.6 pg (ref 26.0–34.0)
MCHC: 32.2 g/dL (ref 30.0–36.0)
MCV: 82.5 fL (ref 80.0–100.0)
Monocytes Absolute: 0.6 10*3/uL (ref 0.1–1.0)
Monocytes Relative: 10 %
Neutro Abs: 4.3 10*3/uL (ref 1.7–7.7)
Neutrophils Relative %: 74 %
Platelet Count: 292 10*3/uL (ref 150–400)
RBC: 3.2 MIL/uL — ABNORMAL LOW (ref 4.22–5.81)
RDW: 18.5 % — ABNORMAL HIGH (ref 11.5–15.5)
WBC Count: 5.8 10*3/uL (ref 4.0–10.5)
nRBC: 0 % (ref 0.0–0.2)

## 2021-03-26 LAB — CMP (CANCER CENTER ONLY)
ALT: 11 U/L (ref 0–44)
AST: 23 U/L (ref 15–41)
Albumin: 2.5 g/dL — ABNORMAL LOW (ref 3.5–5.0)
Alkaline Phosphatase: 119 U/L (ref 38–126)
Anion gap: 8 (ref 5–15)
BUN: 18 mg/dL (ref 8–23)
CO2: 23 mmol/L (ref 22–32)
Calcium: 8.3 mg/dL — ABNORMAL LOW (ref 8.9–10.3)
Chloride: 104 mmol/L (ref 98–111)
Creatinine: 1.06 mg/dL (ref 0.61–1.24)
GFR, Estimated: >60 mL/min (ref >60)
Glucose, Bld: 144 mg/dL — ABNORMAL HIGH (ref 70–99)
Potassium: 4.1 mmol/L (ref 3.5–5.1)
Sodium: 135 mmol/L (ref 135–145)
Total Bilirubin: 0.9 mg/dL (ref 0.3–1.2)
Total Protein: 6.5 g/dL (ref 6.5–8.1)

## 2021-03-26 LAB — TSH: TSH: 2.294 u[IU]/mL (ref 0.320–4.118)

## 2021-03-26 LAB — SAMPLE TO BLOOD BANK

## 2021-03-26 LAB — PREPARE RBC (CROSSMATCH)

## 2021-03-26 MED ORDER — HEPARIN SOD (PORK) LOCK FLUSH 100 UNIT/ML IV SOLN
500.0000 [IU] | Freq: Every day | INTRAVENOUS | Status: AC | PRN
  Administered 2021-03-26: 500 [IU]

## 2021-03-26 MED ORDER — DIPHENHYDRAMINE HCL 25 MG PO CAPS
25.0000 mg | ORAL_CAPSULE | Freq: Once | ORAL | Status: AC
  Administered 2021-03-26: 25 mg via ORAL
  Filled 2021-03-26: qty 1

## 2021-03-26 MED ORDER — ACETAMINOPHEN 325 MG PO TABS
650.0000 mg | ORAL_TABLET | Freq: Once | ORAL | Status: AC
  Administered 2021-03-26: 650 mg via ORAL
  Filled 2021-03-26: qty 2

## 2021-03-26 MED ORDER — SODIUM CHLORIDE 0.9% FLUSH
10.0000 mL | INTRAVENOUS | Status: AC | PRN
  Administered 2021-03-26: 10 mL

## 2021-03-26 MED ORDER — SODIUM CHLORIDE 0.9% FLUSH
10.0000 mL | Freq: Once | INTRAVENOUS | Status: AC
Start: 2021-03-26 — End: 2021-03-26
  Administered 2021-03-26: 10 mL

## 2021-03-26 MED ORDER — SODIUM CHLORIDE 0.9% IV SOLUTION
250.0000 mL | Freq: Once | INTRAVENOUS | Status: AC
  Administered 2021-03-26: 250 mL via INTRAVENOUS

## 2021-03-26 NOTE — Progress Notes (Signed)
Hematology and Oncology Follow Up Visit  Lawrence Hall [id number] 25-Aug-1945 75 y.o. 03/26/2021 1:00 PM Lawrence Hall, MDKoberlein, Lawrence Berg, MD   Principle Diagnosis: 75 year old man with stage IV high-grade urothelial carcinoma with bone and hepatic metastasis diagnosed in 2022.  He initially presented with left renal pelvis tumor and localized disease in 2021.   Prior Therapy: He is status post biopsy obtained on Aug 21, 2019 which showed high-grade urothelial carcinoma of the left renal pelvis.   He status post neoadjuvant chemotherapy utilizing gemcitabine and cisplatin given on day 1 and cisplatin given on day 8 start her on 10/06/2019.   He completed 3 cycles of therapy in August 2021.  He is status post left robotic assisted laparoscopic nephroureterectomy and retroperitoneal lymphadenectomy completed on 01/04/2020.  He was found to have a residual T4 disease with 1 out of 5 lymph nodes involved.  He is status post tumor resection from his right humerus with surgical fixation and rodding completed by Dr. Lovena Le at [place] completed in June 2022.  The final pathology confirmed the presence of metastatic urothelial carcinoma.  Radiation therapy to be right humerus.  He completed 10 fractions for a total of 30 Gray completed in July 2022.  Pembrolizumab 200 mg started on Sep 13, 2020.  He is currently receiving 400 mg every 6 weeks.  He completed cycle 3 on December 13, 2020.  Current therapy: Padcev 1 mg/kg day 1 and day 15 out of a 28-day cycle started on February 12, 2021.      Interim History: [name] returns today for a follow-up visit.  Since the last visit, he reports no major changes in his health.  He continues to have gradual decline and failure to thrive and weight loss.  He is off dexamethasone which has offered some transient improvement in his symptoms.  He is ambulating without any help of walker or cane although unable to drive.  He denies any worsening pain at  this time.  He does report excessive fatigue and dyspnea on exertion.    Medications: Reviewed without changes. Current Outpatient Medications  Medication Sig Dispense Refill   aspirin EC 81 MG tablet Take 1 tablet (81 mg total) by mouth daily. Swallow whole. (Patient taking differently: Take 81 mg by mouth at bedtime. Swallow whole.) 30 tablet 11   atorvastatin (LIPITOR) 80 MG tablet TAKE 1 TABLET ONCE DAILY AT 6 PM. (Patient taking differently: Take 80 mg by mouth daily at 6 PM.) 90 tablet 3   Cholecalciferol (VITAMIN D) 50 MCG (2000 UT) tablet Take 2,000 Units by mouth daily with lunch.     metoprolol succinate (TOPROL-XL) 50 MG 24 hr tablet Take 1 tablet (50 mg total) by mouth at bedtime. 90 tablet 3   Milk Thistle 1000 MG CAPS Take 1,000 mg by mouth every other day.     mirtazapine (REMERON) 15 MG tablet Take 1 tablet (15 mg total) by mouth at bedtime. 90 tablet 1   nitroGLYCERIN (NITROSTAT) 0.4 MG SL tablet 1 TAB UNDER TONGUE AS NEEDED FOR CHEST PAIN. MAY REPEAT EVERY 5 MIN FOR A TOTAL OF 3 DOSES. (Patient taking differently: Place 0.4 mg under the tongue every 5 (five) minutes as needed for chest pain.) 25 tablet 4   oxyCODONE (OXY IR/ROXICODONE) 5 MG immediate release tablet Take 1 tablet (5 mg total) by mouth every 4 (four) hours as needed for breakthrough pain or moderate pain. 20 tablet 0   polyethylene glycol (MIRALAX / GLYCOLAX) 17  g packet Take 17 g by mouth daily. (Patient taking differently: Take 17 g by mouth daily as needed (constipation).) 14 each 0   No current facility-administered medications for this visit.     Allergies:  Allergies  Allergen Reactions   Other Itching and Other (See Comments)    Pet dander, seasonal allergies = Runny nose, itchy eyes      Physical Exam:    Blood pressure (!) 138/98, pulse (!) 102, temperature 97.8 F (36.6 C), temperature source Temporal, resp. rate 17, height 6' (1.829 m), weight 154 lb 4.8 oz (70 kg), SpO2 100  %.      ECOG: 2   General appearance: Comfortable appearing without any discomfort Head: Normocephalic without any trauma Oropharynx: Mucous membranes are moist and pink without any thrush or ulcers. Eyes: Pupils are equal and round reactive to light. Lymph nodes: No cervical, supraclavicular, inguinal or axillary lymphadenopathy.   Heart:regular rate and rhythm.  S1 and S2 without leg edema. Lung: Clear without any rhonchi or wheezes.  No dullness to percussion. Abdomin: Soft, nontender, nondistended with good bowel sounds.  No hepatosplenomegaly. Musculoskeletal: No joint deformity or effusion.  Full range of motion noted. Neurological: No deficits noted on motor, sensory and deep tendon reflex exam. Skin: No petechial rash or dryness.  Appeared moist.               Lab Results: Lab Results  Component Value Date   WBC 11.6 (H) 03/12/2021   HGB 7.6 (L) 03/12/2021   HCT 23.7 (L) 03/12/2021   MCV 83.5 03/12/2021   PLT 233 03/12/2021     Chemistry      Component Value Date/Time   NA 135 03/12/2021 1435   NA 137 04/07/2019 1021   K 4.5 03/12/2021 1435   CL 104 03/12/2021 1435   CO2 20 (L) 03/12/2021 1435   BUN 51 (H) 03/12/2021 1435   BUN 29 (H) 04/07/2019 1021   CREATININE 1.27 (H) 03/12/2021 1435      Component Value Date/Time   CALCIUM 8.6 (L) 03/12/2021 1435   ALKPHOS 113 03/12/2021 1435   AST 19 03/12/2021 1435   ALT 18 03/12/2021 1435   BILITOT 1.4 (H) 03/12/2021 1435         Impression and Plan:  75 year old man with:     1.  Renal pelvis tumor diagnosed in 2021.  He developed stage IV high-grade urothelial carcinoma in 2022.  His disease status was updated at this time and treatment choices were reviewed.  Supportive care and transitioning to hospice remain his best option with Padcev chemotherapy has been tried as a salvage option.  After discussion today, we opted to continue with supportive care at this time and defer the option of  chemotherapy.  CT scan obtained on March 21, 2021 was personally reviewed and discussed with the patient and his spouse.  His disease continued to progress and is unlikely to benefit from any additional chemotherapy.  We will continue to address this issue with him moving forward and continue supportive management.  He is not ready to commit to hospice transition at this time.  He will continue to consider it.     2.  IV access: Port-A-Cath continues to be in use for the time being.    3.  Failure to thrive: Related to malignancy and previous chemotherapy treatment.      4.  Anemia: His hemoglobin today is 8.5 and is symptomatic.  We will arrange for packed red cell  transfusion today given his symptoms.   5.  Prognosis and goals of care: His disease is incurable and approaching end-stage status with poor prognosis.   6.  Follow-up: He will continue to follow periodically for IV fluids and supportive management.   30  minutes were spent on this visit.  Time was dedicated to reviewing laboratory data, CT scan images, treatment choices and future plan of care discussion.       [name], MD 12/7/20221:00 PM

## 2021-03-26 NOTE — Patient Instructions (Signed)
Blood Transfusion, Adult, Care After This sheet gives you information about how to care for yourself after your procedure. Your doctor may also give you more specific instructions. If you have problems or questions, contact your doctor. What can I expect after the procedure? After the procedure, it is common to have: Bruising and soreness at the IV site. A headache. Follow these instructions at home: Insertion site care   Follow instructions from your doctor about how to take care of your insertion site. This is where an IV tube was put into your vein. Make sure you: Wash your hands with soap and water before and after you change your bandage (dressing). If you cannot use soap and water, use hand sanitizer. Change your bandage as told by your doctor. Check your insertion site every day for signs of infection. Check for: Redness, swelling, or pain. Bleeding from the site. Warmth. Pus or a bad smell. General instructions Take over-the-counter and prescription medicines only as told by your doctor. Rest as told by your doctor. Go back to your normal activities as told by your doctor. Keep all follow-up visits as told by your doctor. This is important. Contact a doctor if: You have itching or red, swollen areas of skin (hives). You feel worried or nervous (anxious). You feel weak after doing your normal activities. You have redness, swelling, warmth, or pain around the insertion site. You have blood coming from the insertion site, and the blood does not stop with pressure. You have pus or a bad smell coming from the insertion site. Get help right away if: You have signs of a serious reaction. This may be coming from an allergy or the body's defense system (immune system). Signs include: Trouble breathing or shortness of breath. Swelling of the face or feeling warm (flushed). Fever or chills. Head, chest, or back pain. Dark pee (urine) or blood in the pee. Widespread rash. Fast  heartbeat. Feeling dizzy or light-headed. You may receive your blood transfusion in an outpatient setting. If so, you will be told whom to contact to report any reactions. These symptoms may be an emergency. Do not wait to see if the symptoms will go away. Get medical help right away. Call your local emergency services (911 in the [place] Do not drive yourself to the hospital. Summary Bruising and soreness at the IV site are common. Check your insertion site every day for signs of infection. Rest as told by your doctor. Go back to your normal activities as told by your doctor. Get help right away if you have signs of a serious reaction. This information is not intended to replace advice given to you by your health care provider. Make sure you discuss any questions you have with your health care provider. Document Revised: 08/01/2020 Document Reviewed: 09/29/2018 Elsevier Patient Education  2022 Elsevier Inc.  

## 2021-03-26 NOTE — Progress Notes (Signed)
[place] CSW Progress Notes  Patient requests to see social worker in infusion.  He is concerned about his wife, given his terminal diagnosis and impending health decline.  He wonders what support is available to her.  CSW provided contact card, asked him to encourage wife to call so resources/options for support can be explored.  Edwyna Shell, LCSW Clinical Social Worker Phone:  [phone]

## 2021-03-27 ENCOUNTER — Encounter: Payer: Self-pay | Admitting: Family Medicine

## 2021-03-27 LAB — BPAM RBC
Blood Product Expiration Date: 202212282359
ISSUE DATE / TIME: 202212071442
Unit Type and Rh: 6200

## 2021-03-27 LAB — TYPE AND SCREEN
ABO/RH(D): A POS
Antibody Screen: NEGATIVE
Unit division: 0

## 2021-03-28 ENCOUNTER — Encounter: Payer: Self-pay | Admitting: Oncology

## 2021-04-09 ENCOUNTER — Inpatient Hospital Stay: Payer: PPO

## 2021-04-09 ENCOUNTER — Other Ambulatory Visit: Payer: Self-pay

## 2021-04-09 VITALS — BP 124/73 | HR 80 | Temp 97.8°F | Resp 18 | Wt 151.5 lb

## 2021-04-09 DIAGNOSIS — C679 Malignant neoplasm of bladder, unspecified: Secondary | ICD-10-CM | POA: Diagnosis not present

## 2021-04-09 DIAGNOSIS — Z95828 Presence of other vascular implants and grafts: Secondary | ICD-10-CM

## 2021-04-09 DIAGNOSIS — D649 Anemia, unspecified: Secondary | ICD-10-CM

## 2021-04-09 LAB — CMP (CANCER CENTER ONLY)
ALT: 16 U/L (ref 0–44)
AST: 24 U/L (ref 15–41)
Albumin: 3.1 g/dL — ABNORMAL LOW (ref 3.5–5.0)
Alkaline Phosphatase: 131 U/L — ABNORMAL HIGH (ref 38–126)
Anion gap: 8 (ref 5–15)
BUN: 21 mg/dL (ref 8–23)
CO2: 26 mmol/L (ref 22–32)
Calcium: 8.9 mg/dL (ref 8.9–10.3)
Chloride: 102 mmol/L (ref 98–111)
Creatinine: 1.02 mg/dL (ref 0.61–1.24)
GFR, Estimated: >60 mL/min (ref >60)
Glucose, Bld: 110 mg/dL — ABNORMAL HIGH (ref 70–99)
Potassium: 4.1 mmol/L (ref 3.5–5.1)
Sodium: 136 mmol/L (ref 135–145)
Total Bilirubin: 0.9 mg/dL (ref 0.3–1.2)
Total Protein: 7 g/dL (ref 6.5–8.1)

## 2021-04-09 LAB — CBC WITH DIFFERENTIAL (CANCER CENTER ONLY)
Abs Immature Granulocytes: 0.02 10*3/uL (ref 0.00–0.07)
Basophils Absolute: 0 10*3/uL (ref 0.0–0.1)
Basophils Relative: 1 %
Eosinophils Absolute: 0.1 10*3/uL (ref 0.0–0.5)
Eosinophils Relative: 1 %
HCT: 30.7 % — ABNORMAL LOW (ref 39.0–52.0)
Hemoglobin: 9.7 g/dL — ABNORMAL LOW (ref 13.0–17.0)
Immature Granulocytes: 0 %
Lymphocytes Relative: 11 %
Lymphs Abs: 0.9 10*3/uL (ref 0.7–4.0)
MCH: 26.4 pg (ref 26.0–34.0)
MCHC: 31.6 g/dL (ref 30.0–36.0)
MCV: 83.4 fL (ref 80.0–100.0)
Monocytes Absolute: 0.7 10*3/uL (ref 0.1–1.0)
Monocytes Relative: 8 %
Neutro Abs: 6.3 10*3/uL (ref 1.7–7.7)
Neutrophils Relative %: 79 %
Platelet Count: 305 10*3/uL (ref 150–400)
RBC: 3.68 MIL/uL — ABNORMAL LOW (ref 4.22–5.81)
RDW: 17 % — ABNORMAL HIGH (ref 11.5–15.5)
WBC Count: 8.1 10*3/uL (ref 4.0–10.5)
nRBC: 0 % (ref 0.0–0.2)

## 2021-04-09 LAB — SAMPLE TO BLOOD BANK

## 2021-04-09 MED ORDER — SODIUM CHLORIDE 0.9% FLUSH
10.0000 mL | Freq: Once | INTRAVENOUS | Status: AC
  Administered 2021-04-09: 14:00:00 10 mL

## 2021-04-09 MED ORDER — SODIUM CHLORIDE 0.9 % IV SOLN: Freq: Once | INTRAVENOUS | Status: AC

## 2021-04-09 MED ORDER — SODIUM CHLORIDE 0.9 % IV SOLN
Freq: Once | INTRAVENOUS | Status: AC
  Filled 2021-04-09: qty 4

## 2021-04-09 MED ORDER — HEPARIN SOD (PORK) LOCK FLUSH 100 UNIT/ML IV SOLN
500.0000 [IU] | Freq: Once | INTRAVENOUS | Status: AC
  Administered 2021-04-09: 18:00:00 500 [IU]

## 2021-04-09 MED ORDER — SODIUM CHLORIDE 0.9% FLUSH
10.0000 mL | Freq: Once | INTRAVENOUS | Status: AC
Start: 2021-04-09 — End: 2021-04-09
  Administered 2021-04-09: 18:00:00 10 mL

## 2021-04-09 NOTE — Patient Instructions (Signed)

## 2021-04-16 ENCOUNTER — Telehealth: Payer: Self-pay | Admitting: Oncology

## 2021-04-16 ENCOUNTER — Inpatient Hospital Stay: Payer: PPO

## 2021-04-16 ENCOUNTER — Other Ambulatory Visit: Payer: Self-pay

## 2021-04-16 VITALS — BP 113/70 | HR 68 | Temp 97.5°F | Resp 18

## 2021-04-16 DIAGNOSIS — C679 Malignant neoplasm of bladder, unspecified: Secondary | ICD-10-CM | POA: Diagnosis not present

## 2021-04-16 DIAGNOSIS — Z95828 Presence of other vascular implants and grafts: Secondary | ICD-10-CM

## 2021-04-16 MED ORDER — ONDANSETRON HCL 4 MG/2ML IJ SOLN
8.0000 mg | Freq: Once | INTRAMUSCULAR | Status: AC
  Administered 2021-04-16: 10:00:00 8 mg via INTRAVENOUS
  Filled 2021-04-16: qty 4

## 2021-04-16 MED ORDER — SODIUM CHLORIDE 0.9 % IV SOLN: Freq: Once | INTRAVENOUS | Status: AC

## 2021-04-16 MED ORDER — SODIUM CHLORIDE 0.9 % IV SOLN: Freq: Once | INTRAVENOUS | Status: DC

## 2021-04-16 MED ORDER — HEPARIN SOD (PORK) LOCK FLUSH 100 UNIT/ML IV SOLN
500.0000 [IU] | Freq: Once | INTRAVENOUS | Status: AC
Start: 2021-04-16 — End: 2021-04-16
  Administered 2021-04-16: 12:00:00 500 [IU]

## 2021-04-16 MED ORDER — SODIUM CHLORIDE 0.9% FLUSH
10.0000 mL | Freq: Once | INTRAVENOUS | Status: AC
  Administered 2021-04-16: 12:00:00 10 mL

## 2021-04-16 NOTE — Telephone Encounter (Signed)
Called patient regarding upcoming appointments, patient is notified. 

## 2021-04-16 NOTE — Patient Instructions (Signed)

## 2021-04-23 ENCOUNTER — Other Ambulatory Visit: Payer: Self-pay

## 2021-04-23 ENCOUNTER — Inpatient Hospital Stay: Payer: PPO

## 2021-04-23 ENCOUNTER — Inpatient Hospital Stay: Payer: PPO | Attending: Oncology

## 2021-04-23 VITALS — BP 125/78 | HR 81 | Temp 97.8°F | Resp 18 | Wt 146.5 lb

## 2021-04-23 DIAGNOSIS — C679 Malignant neoplasm of bladder, unspecified: Secondary | ICD-10-CM | POA: Diagnosis not present

## 2021-04-23 DIAGNOSIS — D649 Anemia, unspecified: Secondary | ICD-10-CM

## 2021-04-23 DIAGNOSIS — C787 Secondary malignant neoplasm of liver and intrahepatic bile duct: Secondary | ICD-10-CM | POA: Diagnosis not present

## 2021-04-23 DIAGNOSIS — C7951 Secondary malignant neoplasm of bone: Secondary | ICD-10-CM | POA: Insufficient documentation

## 2021-04-23 DIAGNOSIS — Z95828 Presence of other vascular implants and grafts: Secondary | ICD-10-CM

## 2021-04-23 LAB — CBC WITH DIFFERENTIAL (CANCER CENTER ONLY)
Abs Immature Granulocytes: 0.03 10*3/uL (ref 0.00–0.07)
Basophils Absolute: 0 10*3/uL (ref 0.0–0.1)
Basophils Relative: 0 %
Eosinophils Absolute: 0.1 10*3/uL (ref 0.0–0.5)
Eosinophils Relative: 2 %
HCT: 30.1 % — ABNORMAL LOW (ref 39.0–52.0)
Hemoglobin: 9.7 g/dL — ABNORMAL LOW (ref 13.0–17.0)
Immature Granulocytes: 0 %
Lymphocytes Relative: 12 %
Lymphs Abs: 1 10*3/uL (ref 0.7–4.0)
MCH: 26.5 pg (ref 26.0–34.0)
MCHC: 32.2 g/dL (ref 30.0–36.0)
MCV: 82.2 fL (ref 80.0–100.0)
Monocytes Absolute: 0.5 10*3/uL (ref 0.1–1.0)
Monocytes Relative: 6 %
Neutro Abs: 7 10*3/uL (ref 1.7–7.7)
Neutrophils Relative %: 80 %
Platelet Count: 293 10*3/uL (ref 150–400)
RBC: 3.66 MIL/uL — ABNORMAL LOW (ref 4.22–5.81)
RDW: 17.1 % — ABNORMAL HIGH (ref 11.5–15.5)
WBC Count: 8.7 10*3/uL (ref 4.0–10.5)
nRBC: 0 % (ref 0.0–0.2)

## 2021-04-23 LAB — CMP (CANCER CENTER ONLY)
ALT: 22 U/L (ref 0–44)
AST: 27 U/L (ref 15–41)
Albumin: 3.1 g/dL — ABNORMAL LOW (ref 3.5–5.0)
Alkaline Phosphatase: 128 U/L — ABNORMAL HIGH (ref 38–126)
Anion gap: 8 (ref 5–15)
BUN: 28 mg/dL — ABNORMAL HIGH (ref 8–23)
CO2: 25 mmol/L (ref 22–32)
Calcium: 9.1 mg/dL (ref 8.9–10.3)
Chloride: 101 mmol/L (ref 98–111)
Creatinine: 1.09 mg/dL (ref 0.61–1.24)
GFR, Estimated: >60 mL/min (ref >60)
Glucose, Bld: 153 mg/dL — ABNORMAL HIGH (ref 70–99)
Potassium: 3.9 mmol/L (ref 3.5–5.1)
Sodium: 134 mmol/L — ABNORMAL LOW (ref 135–145)
Total Bilirubin: 1 mg/dL (ref 0.3–1.2)
Total Protein: 7.6 g/dL (ref 6.5–8.1)

## 2021-04-23 LAB — SAMPLE TO BLOOD BANK

## 2021-04-23 MED ORDER — SODIUM CHLORIDE 0.9% FLUSH: 10.0000 mL | Freq: Once | INTRAVENOUS | Status: DC

## 2021-04-23 MED ORDER — ONDANSETRON HCL 4 MG/2ML IJ SOLN
8.0000 mg | Freq: Once | INTRAMUSCULAR | Status: AC
  Administered 2021-04-23: 8 mg via INTRAVENOUS
  Filled 2021-04-23: qty 4

## 2021-04-23 MED ORDER — SODIUM CHLORIDE 0.9% FLUSH
10.0000 mL | Freq: Once | INTRAVENOUS | Status: AC | PRN
  Administered 2021-04-23: 10 mL

## 2021-04-23 MED ORDER — HEPARIN SOD (PORK) LOCK FLUSH 100 UNIT/ML IV SOLN
500.0000 [IU] | Freq: Once | INTRAVENOUS | Status: AC | PRN
  Administered 2021-04-23: 500 [IU]

## 2021-04-23 MED ORDER — SODIUM CHLORIDE 0.9 % IV SOLN: Freq: Once | INTRAVENOUS | Status: AC

## 2021-04-23 NOTE — Patient Instructions (Signed)

## 2021-04-28 DIAGNOSIS — C801 Malignant (primary) neoplasm, unspecified: Secondary | ICD-10-CM | POA: Diagnosis not present

## 2021-04-28 DIAGNOSIS — Z9189 Other specified personal risk factors, not elsewhere classified: Secondary | ICD-10-CM | POA: Diagnosis not present

## 2021-04-28 DIAGNOSIS — Z9889 Other specified postprocedural states: Secondary | ICD-10-CM | POA: Diagnosis not present

## 2021-04-28 DIAGNOSIS — M898X2 Other specified disorders of bone, upper arm: Secondary | ICD-10-CM | POA: Diagnosis not present

## 2021-04-28 DIAGNOSIS — C7951 Secondary malignant neoplasm of bone: Secondary | ICD-10-CM | POA: Diagnosis not present

## 2021-04-29 ENCOUNTER — Encounter: Payer: Self-pay | Admitting: Oncology

## 2021-04-30 ENCOUNTER — Inpatient Hospital Stay: Payer: PPO | Admitting: Dietician

## 2021-04-30 ENCOUNTER — Other Ambulatory Visit: Payer: Self-pay

## 2021-04-30 ENCOUNTER — Encounter: Payer: Self-pay | Admitting: Oncology

## 2021-04-30 ENCOUNTER — Inpatient Hospital Stay: Payer: PPO

## 2021-04-30 NOTE — Progress Notes (Signed)
Nutrition Follow-up:  76 year old male with stage IV urothelial carcinoma with hepatic and osseous mets s/p radical resection to right humerus. Patient completed radiation therapy to right humerus in July 2022. Patient started [facility] 02/12/21. Chemotherapy currently on hold. Provider cancelled IV hydration today.   Met with patient and wife in office today. Patient reports feeling "befuddled" with learning he would no longer be receiving treatments at last MD visit. Patient reports he was not given detailed reasoning or end of life education/expectations. Patient reports currently seeking second opinion. Patient reports appetite waxes and wanes, he continues to lose weight. He is drinking 1-2 Ensure/Boost. Patient is taking remeron for appetite. Patient reports occasional metallic taste and early satiety. Patient has been avoiding foods with sugar, he has is wife have been previously told sugar feeds cancer. Patient does not care for taste of water, has been eating grapes and oranges to aid with hydration.    Medications: reviewed  Labs: 1/04 - Na 134, Glucose 153, BUN 28, Alkaline Phosphatase 128  Anthropometrics: Last weight 146 lb 8 oz on 1/04  12/21 - 151 lb 8 oz 12/7 - 154 lb 4.8 oz  11/15 - 167 lb 6.4 oz  11/9 - 160 lb 6.4 oz    NUTRITION DIAGNOSIS: Severe malnutrition continues     INTERVENTION:  Encouraged bites/sips of high calorie high protein foods q2 hours Educated on all cells including cancer cells utilize sugar in form of glucose for energy and encouraged liberalized diet to promote weight stability - fact sheet provided Discussed strategies for adding calories/protein to foods (switching to whole milk and adding to oatmeal, soups vs water, cooking with butter, adding cheese to foods) - handout and shake recipes provided Discussed strategies for altered taste - handout provided Suggested baking soda salt water rinses several times daily - recipe provided Discussed  strategies for poor appetite - handout provided Continue taking remeron as prescribed Continue drinking Ensure Plus 2-3/daily - coupons given  Educated on support services available at [facility] - handouts provided Patient and wife interested in speaking with chaplain - will send in-basket Provided support and encouragement Contact information provided     MONITORING, EVALUATION, GOAL: weight trends, intake    NEXT VISIT: To be scheduled

## 2021-05-02 IMAGING — CT CT ABD-PELV W/O CM
2 of 4 series · 14 of 36 positions shown, 17 images · non-contrast
Comparison: MRI abdomen/pelvis dated 11/27/2019. CT chest dated
04/27/2018.

CLINICAL DATA: Left renal cancer, status post left nephrectomy,
chemotherapy complete. History of prostate cancer, status post
prostatectomy.

EXAM:
CT CHEST, ABDOMEN AND PELVIS WITHOUT CONTRAST
TECHNIQUE: Multidetector CT imaging of the chest, abdomen and pelvis was
performed following the standard protocol without IV contrast.

[Series 2: cap w/o · axial · non-contrast · 0.90mm/px · z∈[+1105,+1655]mm · 11 of 133 slices shown, 14 images]
[im 12/133  mediastinal]
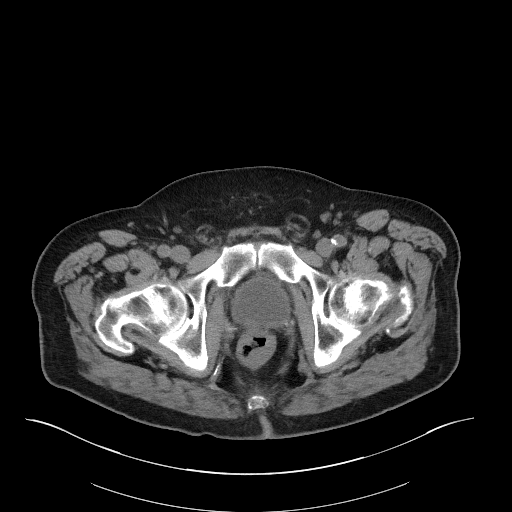
[im 12/133  lung]
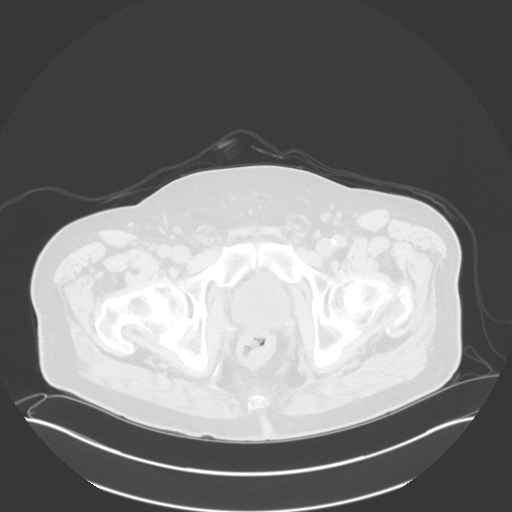
[im 23/133  lung]
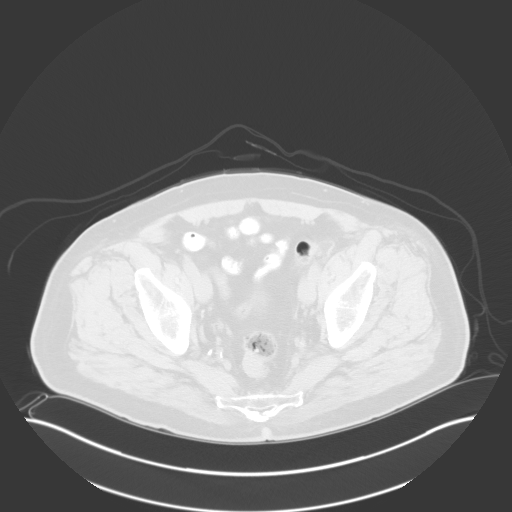
[im 34/133  lung]
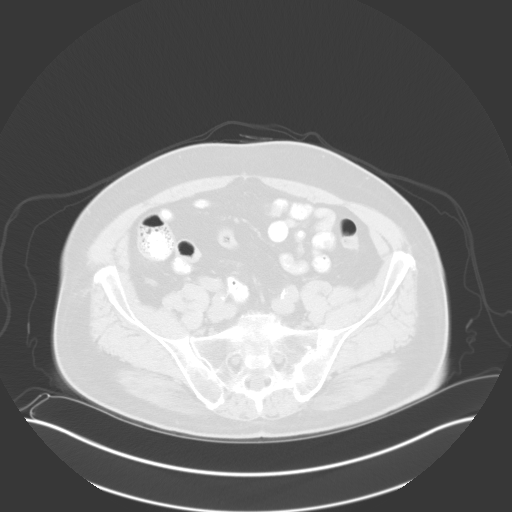
[im 45/133  lung]
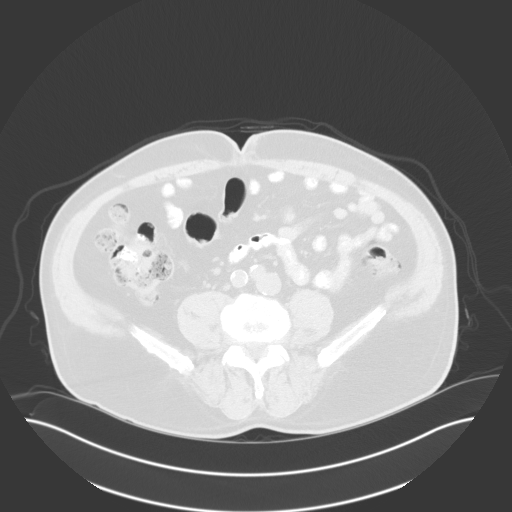
[im 56/133  mediastinal]
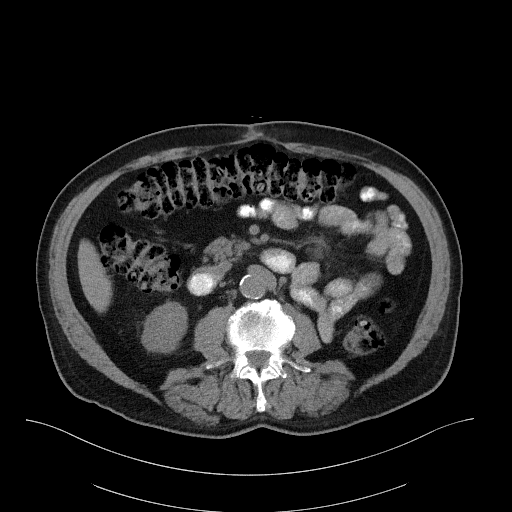
[im 56/133  lung]
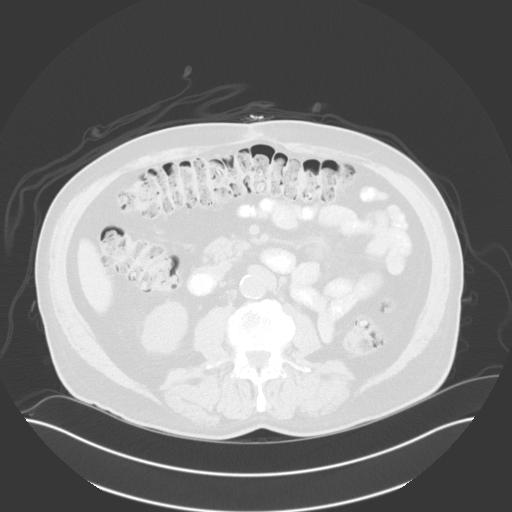
[im 67/133  lung]
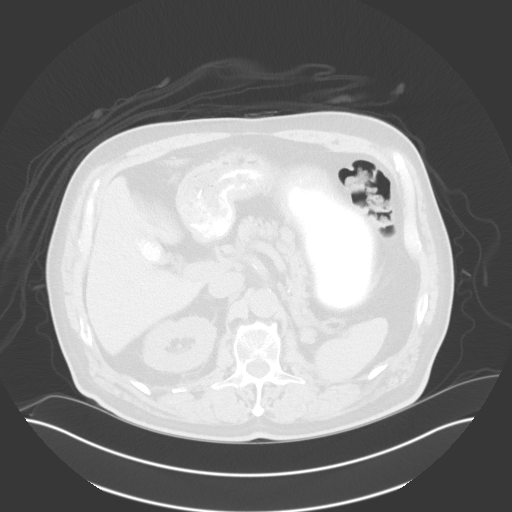
[im 78/133  lung]
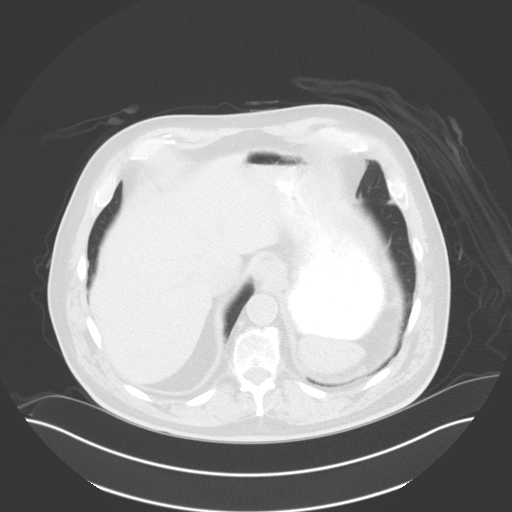
[im 89/133  lung]
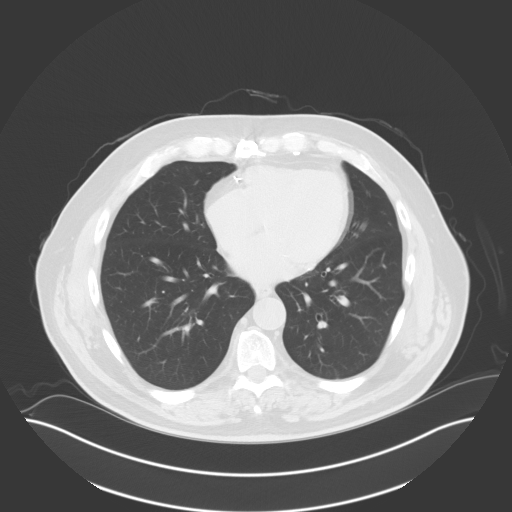
[im 100/133  mediastinal]
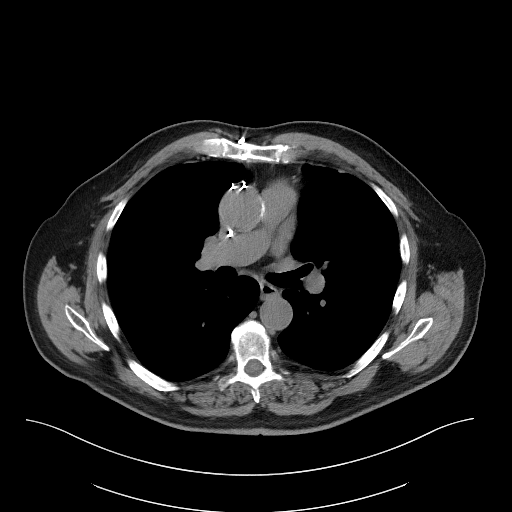
[im 100/133  lung]
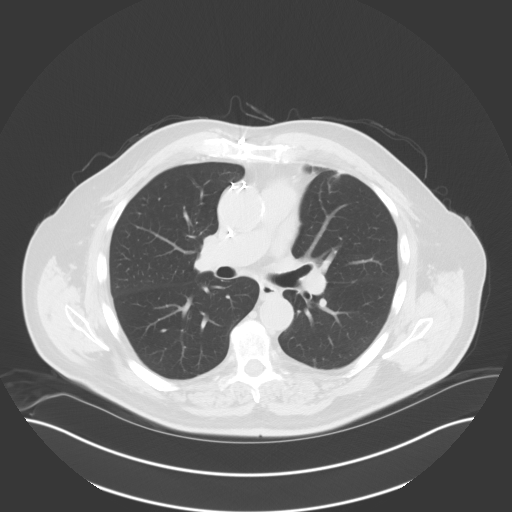
[im 111/133  lung]
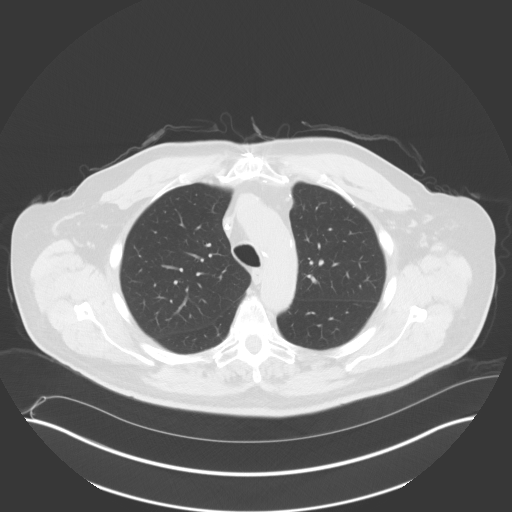
[im 122/133  lung]
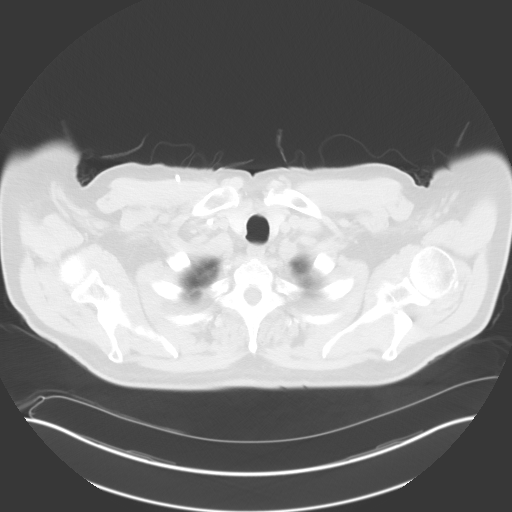

[Series 4: coronals · coronal · 0.93mm/px · 3 of 162 slices shown]
[im 33/162  lung]
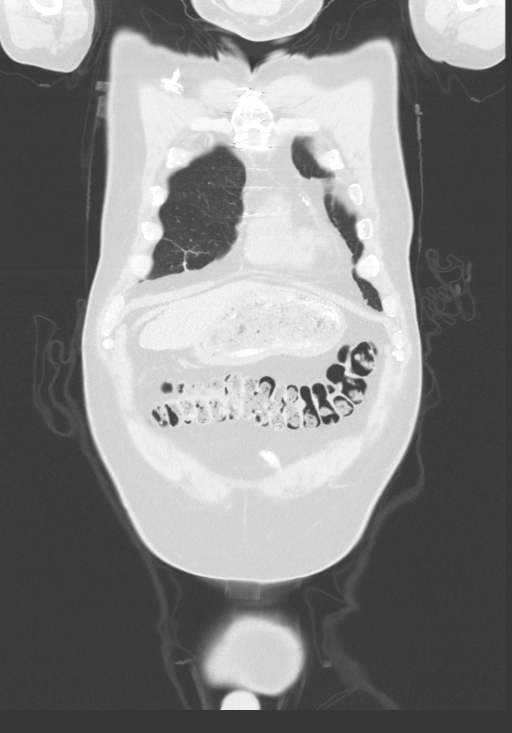
[im 65/162  lung]
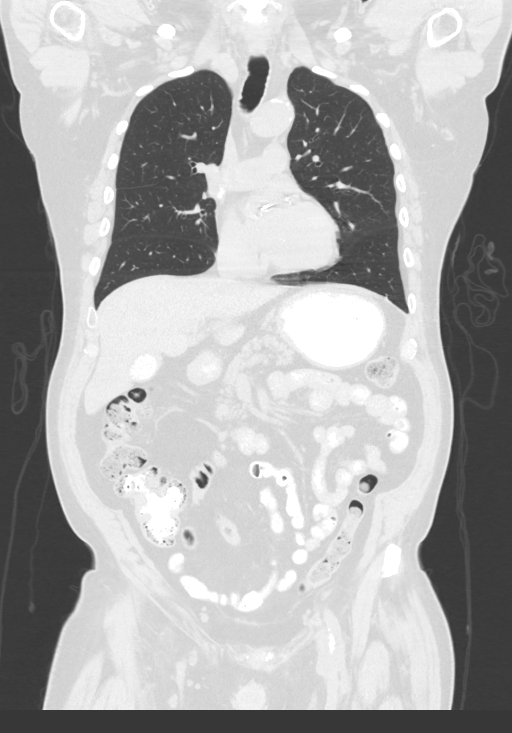
[im 97/162  lung]
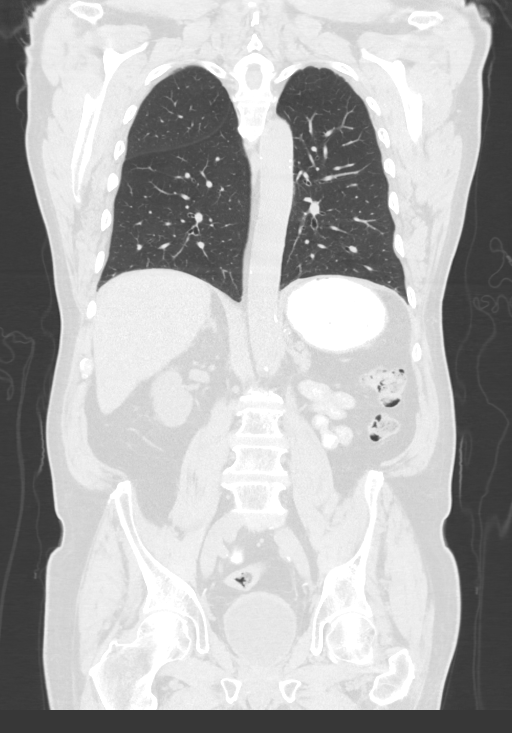

[14 of 36 positions shown; findings below may reference images not displayed]

FINDINGS: CT CHEST FINDINGS

Cardiovascular: Heart is normal in size.  No pericardial effusion.

Prosthetic aortic valve. Atherosclerotic calcifications of the
aortic arch.

Three vessel coronary atherosclerosis. Postsurgical changes related
to prior CABG.

Right chest port terminates at the cavoatrial junction.

Mediastinum/Nodes: No suspicious mediastinal lymphadenopathy.

Visualized thyroid is unremarkable.

Lungs/Pleura: Mild biapical pleural-parenchymal scarring.

No focal consolidation.

No suspicious pulmonary nodules.

No pleural effusion or pneumothorax.

Musculoskeletal: Mild degenerative changes of the mid/lower thoracic
spine. Median sternotomy.

CT ABDOMEN PELVIS FINDINGS

Hepatobiliary: Unenhanced liver is unremarkable.

Numerous layering gallstones (series 2/image 70), without associated
inflammatory changes. No intrahepatic or extrahepatic ductal
dilatation.

Pancreas: Within normal limits.

Spleen: Within normal limits. Splenule in the left upper abdomen
(series 2/image 69).

Adrenals/Urinary Tract: Adrenal glands are within normal limits.

Status post left nephrectomy.

Subcentimeter cyst in the right lower kidney (series 2/image 37). No
hydronephrosis.

Thick-walled bladder, although underdistended.

Stomach/Bowel: Stomach is within normal limits.

No evidence of bowel obstruction.

Normal appendix (series 2/image 103).

Vascular/Lymphatic: No evidence of abdominal aortic aneurysm.

Atherosclerotic calcifications of the abdominal aorta and branch
vessels. Left IVC (series 2/image 86).

No suspicious abdominopelvic lymphadenopathy.

Reproductive: Status post prostatectomy.

Other: No abdominopelvic ascites.

Mild fat in the left inguinal canal.

Musculoskeletal: Degenerative changes the lumbar spine. Inferior
endplate Schmorl's node deformity at L4. No focal osseous lesions.
IMPRESSION: Status post left nephrectomy. Status post prostatectomy.

No evidence of recurrent or metastatic disease.

Cholelithiasis, without associated inflammatory changes.

## 2021-05-05 ENCOUNTER — Ambulatory Visit: Payer: PPO | Admitting: Family Medicine

## 2021-05-05 ENCOUNTER — Inpatient Hospital Stay (HOSPITAL_BASED_OUTPATIENT_CLINIC_OR_DEPARTMENT_OTHER): Payer: PPO | Admitting: Oncology

## 2021-05-05 ENCOUNTER — Other Ambulatory Visit: Payer: Self-pay

## 2021-05-05 ENCOUNTER — Inpatient Hospital Stay: Payer: PPO

## 2021-05-05 ENCOUNTER — Encounter: Payer: Self-pay | Admitting: Licensed Clinical Social Worker

## 2021-05-05 VITALS — BP 104/61 | HR 89 | Temp 98.4°F | Resp 17 | Ht 72.0 in | Wt 146.0 lb

## 2021-05-05 DIAGNOSIS — Z95828 Presence of other vascular implants and grafts: Secondary | ICD-10-CM

## 2021-05-05 DIAGNOSIS — D649 Anemia, unspecified: Secondary | ICD-10-CM

## 2021-05-05 DIAGNOSIS — C679 Malignant neoplasm of bladder, unspecified: Secondary | ICD-10-CM | POA: Diagnosis not present

## 2021-05-05 LAB — CBC WITH DIFFERENTIAL (CANCER CENTER ONLY)
Abs Immature Granulocytes: 0.02 10*3/uL (ref 0.00–0.07)
Basophils Absolute: 0 10*3/uL (ref 0.0–0.1)
Basophils Relative: 1 %
Eosinophils Absolute: 0.1 10*3/uL (ref 0.0–0.5)
Eosinophils Relative: 1 %
HCT: 29 % — ABNORMAL LOW (ref 39.0–52.0)
Hemoglobin: 9.4 g/dL — ABNORMAL LOW (ref 13.0–17.0)
Immature Granulocytes: 0 %
Lymphocytes Relative: 9 %
Lymphs Abs: 0.7 10*3/uL (ref 0.7–4.0)
MCH: 26.9 pg (ref 26.0–34.0)
MCHC: 32.4 g/dL (ref 30.0–36.0)
MCV: 83.1 fL (ref 80.0–100.0)
Monocytes Absolute: 0.6 10*3/uL (ref 0.1–1.0)
Monocytes Relative: 8 %
Neutro Abs: 6.4 10*3/uL (ref 1.7–7.7)
Neutrophils Relative %: 81 %
Platelet Count: 263 10*3/uL (ref 150–400)
RBC: 3.49 MIL/uL — ABNORMAL LOW (ref 4.22–5.81)
RDW: 17.9 % — ABNORMAL HIGH (ref 11.5–15.5)
WBC Count: 7.8 10*3/uL (ref 4.0–10.5)
nRBC: 0 % (ref 0.0–0.2)

## 2021-05-05 LAB — CMP (CANCER CENTER ONLY)
ALT: 132 U/L — ABNORMAL HIGH (ref 0–44)
AST: 144 U/L — ABNORMAL HIGH (ref 15–41)
Albumin: 3.2 g/dL — ABNORMAL LOW (ref 3.5–5.0)
Alkaline Phosphatase: 422 U/L — ABNORMAL HIGH (ref 38–126)
Anion gap: 8 (ref 5–15)
BUN: 28 mg/dL — ABNORMAL HIGH (ref 8–23)
CO2: 25 mmol/L (ref 22–32)
Calcium: 9.1 mg/dL (ref 8.9–10.3)
Chloride: 103 mmol/L (ref 98–111)
Creatinine: 1.1 mg/dL (ref 0.61–1.24)
GFR, Estimated: >60 mL/min (ref >60)
Glucose, Bld: 140 mg/dL — ABNORMAL HIGH (ref 70–99)
Potassium: 3.8 mmol/L (ref 3.5–5.1)
Sodium: 136 mmol/L (ref 135–145)
Total Bilirubin: 3.5 mg/dL — ABNORMAL HIGH (ref 0.3–1.2)
Total Protein: 7.1 g/dL (ref 6.5–8.1)

## 2021-05-05 LAB — SAMPLE TO BLOOD BANK

## 2021-05-05 MED ORDER — SODIUM CHLORIDE 0.9 % IV SOLN: Freq: Once | INTRAVENOUS | Status: AC

## 2021-05-05 MED ORDER — ONDANSETRON HCL 4 MG/2ML IJ SOLN
8.0000 mg | Freq: Once | INTRAMUSCULAR | Status: AC
  Administered 2021-05-05: 8 mg via INTRAVENOUS
  Filled 2021-05-05: qty 4

## 2021-05-05 MED ORDER — HEPARIN SOD (PORK) LOCK FLUSH 100 UNIT/ML IV SOLN
500.0000 [IU] | Freq: Once | INTRAVENOUS | Status: AC
  Administered 2021-05-05: 500 [IU]

## 2021-05-05 MED ORDER — SODIUM CHLORIDE 0.9% FLUSH
10.0000 mL | Freq: Once | INTRAVENOUS | Status: AC
  Administered 2021-05-05: 10 mL

## 2021-05-05 NOTE — Progress Notes (Signed)
Hematology and Oncology Follow Up Visit  Lawrence Hall [id number] September 27, 1945 76 y.o. 05/05/2021 8:17 AM Lawrence Hall, MDKoberlein, Lawrence Berg, MD   Principle Diagnosis: 76 year old man with left renal pelvis tumor diagnosed in 2021.  He developed stage IV high-grade urothelial carcinoma with bone and hepatic metastasis in 2022. [name]   Prior Therapy: He is status post biopsy obtained on Aug 21, 2019 which showed high-grade urothelial carcinoma of the left renal pelvis.   He status post neoadjuvant chemotherapy utilizing gemcitabine and cisplatin given on day 1 and cisplatin given on day 8 start her on 10/06/2019.   He completed 3 cycles of therapy in August 2021.  He is status post left robotic assisted laparoscopic nephroureterectomy and retroperitoneal lymphadenectomy completed on 01/04/2020.  He was found to have a residual T4 disease with 1 out of 5 lymph nodes involved.  He is status post tumor resection from his right humerus with surgical fixation and rodding completed by Dr. Lovena Hall at [place] completed in June 2022.  The final pathology confirmed the presence of metastatic urothelial carcinoma.  Radiation therapy to be right humerus.  He completed 10 fractions for a total of 30 Gray completed in July 2022.  Pembrolizumab 200 mg started on Sep 13, 2020.  He is currently receiving 400 mg every 6 weeks.  He completed cycle 3 on December 13, 2020.  Padcev 1 mg/kg day 1 and day 15 out of a 28-day cycle started on February 12, 2021.  Therapy was tolerated poorly and he opted to proceed with supportive care only.  Current therapy: Supportive care only under evaluation to restart of chemotherapy.      Interim History: Lawrence Hall returns today for a follow-up evaluation.  Since the last visit, he reports no major changes in his health.  He continues to feel slightly better with improvement in his quality of life and performance status.  He is eating better although lost 1 pound since last  visit.  He denies any worsening back pain or bone pain.  He denies any pathological fractures or syncope.  He denies any hospitalizations or illnesses.  He still unable to drive short distances to the grocery store.  He ambulates without any assistance at this time.    Medications: Updated on review. Current Outpatient Medications  Medication Sig Dispense Refill   aspirin EC 81 MG tablet Take 1 tablet (81 mg total) by mouth daily. Swallow whole. (Patient taking differently: Take 81 mg by mouth at bedtime. Swallow whole.) 30 tablet 11   atorvastatin (LIPITOR) 80 MG tablet TAKE 1 TABLET ONCE DAILY AT 6 PM. (Patient taking differently: Take 80 mg by mouth daily at 6 PM.) 90 tablet 3   Cholecalciferol (VITAMIN D) 50 MCG (2000 UT) tablet Take 2,000 Units by mouth daily with lunch.     metoprolol succinate (TOPROL-XL) 50 MG 24 hr tablet Take 1 tablet (50 mg total) by mouth at bedtime. 90 tablet 3   Milk Thistle 1000 MG CAPS Take 1,000 mg by mouth every other day.     mirtazapine (REMERON) 15 MG tablet Take 1 tablet (15 mg total) by mouth at bedtime. 90 tablet 1   nitroGLYCERIN (NITROSTAT) 0.4 MG SL tablet 1 TAB UNDER TONGUE AS NEEDED FOR CHEST PAIN. MAY REPEAT EVERY 5 MIN FOR A TOTAL OF 3 DOSES. (Patient taking differently: Place 0.4 mg under the tongue every 5 (five) minutes as needed for chest pain.) 25 tablet 4   oxyCODONE (OXY IR/ROXICODONE) 5 MG immediate  release tablet Take 1 tablet (5 mg total) by mouth every 4 (four) hours as needed for breakthrough pain or moderate pain. 20 tablet 0   polyethylene glycol (MIRALAX / GLYCOLAX) 17 g packet Take 17 g by mouth daily. (Patient taking differently: Take 17 g by mouth daily as needed (constipation).) 14 each 0   No current facility-administered medications for this visit.     Allergies:  Allergies  Allergen Reactions   Other Itching and Other (See Comments)    Pet dander, seasonal allergies = Runny nose, itchy eyes      Physical  Exam:     Blood pressure 104/61, pulse 89, temperature 98.4 F (36.9 C), temperature source Axillary, resp. rate 17, height 6' (1.829 m), weight 146 lb (66.2 kg), SpO2 98 %.      ECOG: 2    General appearance: Alert, awake without any distress. Head: Atraumatic without abnormalities Oropharynx: Without any thrush or ulcers. Eyes: No scleral icterus. Lymph nodes: No lymphadenopathy noted in the cervical, supraclavicular, or axillary nodes Heart:regular rate and rhythm, without any murmurs or gallops.   Lung: Clear to auscultation without any rhonchi, wheezes or dullness to percussion. Abdomin: Soft, nontender without any shifting dullness or ascites. Musculoskeletal: No clubbing or cyanosis. Neurological: No motor or sensory deficits. Skin: No rashes or lesions.              Lab Results: Lab Results  Component Value Date   WBC 8.7 04/23/2021   HGB 9.7 (L) 04/23/2021   HCT 30.1 (L) 04/23/2021   MCV 82.2 04/23/2021   PLT 293 04/23/2021     Chemistry      Component Value Date/Time   NA 134 (L) 04/23/2021 1349   NA 137 04/07/2019 1021   K 3.9 04/23/2021 1349   CL 101 04/23/2021 1349   CO2 25 04/23/2021 1349   BUN 28 (H) 04/23/2021 1349   BUN 29 (H) 04/07/2019 1021   CREATININE 1.09 04/23/2021 1349      Component Value Date/Time   CALCIUM 9.1 04/23/2021 1349   ALKPHOS 128 (H) 04/23/2021 1349   AST 27 04/23/2021 1349   ALT 22 04/23/2021 1349   BILITOT 1.0 04/23/2021 1349         Impression and Plan:  76 year old man with:     1.  Stage IV high-grade urothelial carcinoma of the renal pelvis diagnosed in 2022.  He was found to have hepatic as well as bone involvement.  The natural course of this disease was reviewed again in detail with the patient and treatment choices were reiterated.  He has had 1 treatment of Padcev and tolerated it poorly at that time.  I feel that his performance status has improved since that time and it is reasonable to  revisit it if he is willing and open.  Risks and benefits of this treatment versus alternative treatments such as oral targeted therapy (FGFR) if he harbors the appropriate mutation versus supportive care and hospice were reviewed today.  After discussion today, he opted against any additional chemotherapy for the time being citing the need to gain weight before considering this.  I think it is reasonable to continue with supportive care only.  He continues to thrive off chemotherapy but he understands his malignancy is likely progressing.     2.  IV access: Port-A-Cath remains in use without any complications.    3.  Failure to thrive and dehydration: Multifactorial in nature related to malignancy and previous chemotherapy exposure.  He  is currently receiving intermittent hydration with improvement in that aspect.      4.  Anemia: His hemoglobin is 9.7 on January 4 and does not require transfusion.  His anemia is related to malignancy and previous chemotherapy exposure.  His hemoglobin is adequate today at 9.4.  5.  Prognosis and goals of care: His prognosis overall is poor and his performance status is marginal to receive any additional treatment.  He has thrived off chemotherapy at this time.   6.  Follow-up: He will rule out return weekly for hydration and MD follow-up in 4 weeks.   30  minutes were dedicated to this encounter.  The time was spent on reviewing laboratory data, disease status update, treatment choices and outlining future plan of care review.       [name], MD 1/16/20238:17 AM

## 2021-05-05 NOTE — Patient Instructions (Signed)

## 2021-05-06 ENCOUNTER — Encounter: Payer: Self-pay | Admitting: Licensed Clinical Social Worker

## 2021-05-06 ENCOUNTER — Ambulatory Visit (INDEPENDENT_AMBULATORY_CARE_PROVIDER_SITE_OTHER): Payer: PPO | Admitting: Family Medicine

## 2021-05-06 ENCOUNTER — Encounter: Payer: Self-pay | Admitting: Family Medicine

## 2021-05-06 VITALS — BP 118/60 | HR 52 | Temp 97.9°F | Ht 72.0 in | Wt 147.6 lb

## 2021-05-06 DIAGNOSIS — C7951 Secondary malignant neoplasm of bone: Secondary | ICD-10-CM

## 2021-05-06 DIAGNOSIS — C689 Malignant neoplasm of urinary organ, unspecified: Secondary | ICD-10-CM | POA: Diagnosis not present

## 2021-05-06 NOTE — Progress Notes (Signed)
[place] Work  Clinical Social Work was referred by Rhea Pink RD for assessment of psychosocial needs.  Clinical Social Worker contacted patient by phone  to offer support and assess for needs.  CSW left patient a voicemail with contact information and requested a return call.      Adelene Amas, [place]       First attempt

## 2021-05-06 NOTE — Progress Notes (Signed)
Lawrence Hall DOB: Oct 04, 1945 Encounter date: 05/06/2021  This is a 76 y.o. male who presents with Chief Complaint  Patient presents with   Follow-up    History of present illness: Saw oncology yesterday; note states that patient is feeling better off of chemo although had lost another pound. Elected to continue off chemo to work on weight gain. Continuing with weekly hydration and next visit with oncology is in 1 month.   Continues with weakness, low energy. Feels weight stabilized a couple of weeks ago. Working with nutritionist and eating every 2 hours to keep weight on him.   Had follow up with doc that treated right arm.   Seeing Dr. Theda Sers next week with [facility]- for second opinion re cancer treatments. Feels like he is missing framework for where his health is at. Just feels uncertain of time frame that he has/could have with palliative treatment options. He feels that more specific time frame would be helpful for him deciding next steps.     Allergies  Allergen Reactions   Other Itching and Other (See Comments)    Pet dander, seasonal allergies = Runny nose, itchy eyes   Current Meds  Medication Sig   aspirin EC 81 MG tablet Take 1 tablet (81 mg total) by mouth daily. Swallow whole. (Patient taking differently: Take 81 mg by mouth at bedtime. Swallow whole.)   atorvastatin (LIPITOR) 80 MG tablet TAKE 1 TABLET ONCE DAILY AT 6 PM. (Patient taking differently: Take 80 mg by mouth daily at 6 PM.)   Cholecalciferol (VITAMIN D) 50 MCG (2000 UT) tablet Take 2,000 Units by mouth daily with lunch.   metoprolol succinate (TOPROL-XL) 50 MG 24 hr tablet Take 1 tablet (50 mg total) by mouth at bedtime.   Milk Thistle 1000 MG CAPS Take 1,000 mg by mouth every other day.   mirtazapine (REMERON) 15 MG tablet Take 1 tablet (15 mg total) by mouth at bedtime.   nitroGLYCERIN (NITROSTAT) 0.4 MG SL tablet 1 TAB UNDER TONGUE AS NEEDED FOR CHEST PAIN. MAY REPEAT EVERY 5 MIN FOR A TOTAL OF 3  DOSES. (Patient taking differently: Place 0.4 mg under the tongue every 5 (five) minutes as needed for chest pain.)   oxyCODONE (OXY IR/ROXICODONE) 5 MG immediate release tablet Take 1 tablet (5 mg total) by mouth every 4 (four) hours as needed for breakthrough pain or moderate pain.   polyethylene glycol (MIRALAX / GLYCOLAX) 17 g packet Take 17 g by mouth daily. (Patient taking differently: Take 17 g by mouth daily as needed (constipation).)    Review of Systems  Constitutional:  Positive for fatigue.   Objective:  BP 118/60 (BP Location: Left Arm, Patient Position: Sitting, Cuff Size: Normal)    Pulse (!) 52    Temp 97.9 F (36.6 C) (Oral)    Ht 6' (1.829 m)    Wt 147 lb 9.6 oz (67 kg)    SpO2 95%    BMI 20.02 kg/m   Weight: 147 lb 9.6 oz (67 kg)   BP Readings from Last 3 Encounters:  05/06/21 118/60  05/05/21 104/61  04/23/21 125/78   Wt Readings from Last 3 Encounters:  05/06/21 147 lb 9.6 oz (67 kg)  05/05/21 146 lb (66.2 kg)  04/23/21 146 lb 8 oz (66.5 kg)    Physical Exam Constitutional:      Appearance: He is underweight. He is ill-appearing.  Psychiatric:        Attention and Perception: Attention normal.  Comments: There is tension between patient/wife that is notable. in discussion she talks about having a very positive outlook and wanting to help for the best, and he tends to focus on being realistic.  They do seem to be more on the same page in terms of eating.  Previously she was limiting food selections to things that were more heart healthy since this had been a focus in the past, but after meeting with nutrition is more agreeable to feeding him what ever he is hungry for.  This is also helped him maintain his weight rather than continue to lose in the last couple of weeks.    Assessment/Plan 1. Urothelial cancer [facility]) Patient does have a second opinion at [facility] next week.  I think this is a good thing for him so that he can get input from a new source.  He  understands that he has a terminal cancer that is metastasized.  He understands that treatment at this point is palliative, but he would like to hear from both oncologists there best estimates of how much time he has left and how much treatment at this point would benefit in terms of time.  He felt terribly when he was taking his last chemo treatment, and does not want to feel that way again, but would consider treatment pending the amount of benefit.  We discussed hospice treatment and hospice at home.  His wife is concerned about some of the safety issues with her house including poor plumbing, insufficient floors (plumbing is damage floor admitted week), unhealthy water (wife worries about blood types, but brings in drinking water to the house).  Discussed that hospice care can work around these issues in the house.  She is overwhelmed with all of the things needing to be done, and husband tries to tell her that they need to focus on biggest issue at hand, which is his health.  Suggested making a list of the few most concerning house issues to tackle, rather than continuing to worry about so many things.  Suggested home building contractor that may be able to collaborate health outsourcing of different pieces needed to get the house in a better working condition.  2. Metastasis to bone ([place]) See above.  Patient will update me after visit next week or I will reach out after seeing specialty note. He has contact for hospice and is agreeable to getting them on board to be of help during this time period, but would like more definitive plan/timeline for himself/treatment first.    35 minutes spent in chart review, time with patient/wife in discussion of past treatments and anticipated future treatments as well as benefits of hospice care and quality of life versus quantity of life.   [name], MD

## 2021-05-08 ENCOUNTER — Telehealth: Payer: Self-pay | Admitting: Family Medicine

## 2021-05-08 NOTE — Telephone Encounter (Signed)
Patient wife [name]) brought in paperwork that she needs Dr.[name] to review.   [name] is requesting for Dr.[name] to type up a paper informing the courthouse that she can't go to jury duty because she's the caregiver of the patient. She stated that the patient is in a condition to where he can't be left alone for long periods of time.   [name] provided the number of a representative with the name of Marcelino Scot that Dr.[name] could contact if she needs to speak with them. Marcelino Scot has two contact numbers which is [phone] and [phone].  Paperwork would be placed in the folder.  Paperwork could be faxed over to [phone].  [name] would like to be contacted once this is done at [phone].  Please advise.

## 2021-05-09 ENCOUNTER — Encounter: Payer: Self-pay | Admitting: Family Medicine

## 2021-05-09 ENCOUNTER — Encounter: Payer: Self-pay | Admitting: Licensed Clinical Social Worker

## 2021-05-09 ENCOUNTER — Other Ambulatory Visit: Payer: Self-pay | Admitting: Oncology

## 2021-05-09 DIAGNOSIS — C652 Malignant neoplasm of left renal pelvis: Secondary | ICD-10-CM | POA: Diagnosis not present

## 2021-05-09 DIAGNOSIS — C61 Malignant neoplasm of prostate: Secondary | ICD-10-CM | POA: Diagnosis not present

## 2021-05-09 DIAGNOSIS — C7951 Secondary malignant neoplasm of bone: Secondary | ICD-10-CM | POA: Diagnosis not present

## 2021-05-09 NOTE — Progress Notes (Signed)
[place] CSW Progress Note  Clinical Education officer, museum contacted patient by phone to Follow-up in referral.  CSW left voicemail with contact information and request for return call.Maurice Small Delsin Copen , LCSW  Second Attempt

## 2021-05-09 NOTE — Telephone Encounter (Signed)
Spoke with the patient's wife, informed her the form was completed and faxed along with the letter to [facility] at [phone] attn: Kateri Mc.  [name] is aware a copy of the form, the letter were mailed to the patient's home address and sent to be scanned.

## 2021-05-09 NOTE — Telephone Encounter (Signed)
Letter has been typed/printed and attached to jury duty letter.

## 2021-05-12 ENCOUNTER — Encounter: Payer: Self-pay | Admitting: Licensed Clinical Social Worker

## 2021-05-12 NOTE — Progress Notes (Signed)
[place] CSW Progress Note  Clinical Education officer, museum contacted patient by phone to follow-up on referral.  CSW left voicemail with contact information and request for return call.    Jalayne Ganesh , LCSW Third attempt

## 2021-05-13 ENCOUNTER — Other Ambulatory Visit: Payer: Self-pay

## 2021-05-13 ENCOUNTER — Inpatient Hospital Stay: Payer: PPO

## 2021-05-13 ENCOUNTER — Encounter: Payer: Self-pay | Admitting: General Practice

## 2021-05-13 DIAGNOSIS — Z95828 Presence of other vascular implants and grafts: Secondary | ICD-10-CM

## 2021-05-13 DIAGNOSIS — C679 Malignant neoplasm of bladder, unspecified: Secondary | ICD-10-CM

## 2021-05-13 DIAGNOSIS — D649 Anemia, unspecified: Secondary | ICD-10-CM

## 2021-05-13 LAB — CBC WITH DIFFERENTIAL (CANCER CENTER ONLY)
Abs Immature Granulocytes: 0.03 10*3/uL (ref 0.00–0.07)
Basophils Absolute: 0 10*3/uL (ref 0.0–0.1)
Basophils Relative: 1 %
Eosinophils Absolute: 0.1 10*3/uL (ref 0.0–0.5)
Eosinophils Relative: 2 %
HCT: 28.5 % — ABNORMAL LOW (ref 39.0–52.0)
Hemoglobin: 9.5 g/dL — ABNORMAL LOW (ref 13.0–17.0)
Immature Granulocytes: 1 %
Lymphocytes Relative: 15 %
Lymphs Abs: 1 10*3/uL (ref 0.7–4.0)
MCH: 27.4 pg (ref 26.0–34.0)
MCHC: 33.3 g/dL (ref 30.0–36.0)
MCV: 82.1 fL (ref 80.0–100.0)
Monocytes Absolute: 0.6 10*3/uL (ref 0.1–1.0)
Monocytes Relative: 9 %
Neutro Abs: 4.7 10*3/uL (ref 1.7–7.7)
Neutrophils Relative %: 72 %
Platelet Count: 270 10*3/uL (ref 150–400)
RBC: 3.47 MIL/uL — ABNORMAL LOW (ref 4.22–5.81)
RDW: 17.7 % — ABNORMAL HIGH (ref 11.5–15.5)
WBC Count: 6.4 10*3/uL (ref 4.0–10.5)
nRBC: 0 % (ref 0.0–0.2)

## 2021-05-13 LAB — CMP (CANCER CENTER ONLY)
ALT: 112 U/L — ABNORMAL HIGH (ref 0–44)
AST: 69 U/L — ABNORMAL HIGH (ref 15–41)
Albumin: 3.3 g/dL — ABNORMAL LOW (ref 3.5–5.0)
Alkaline Phosphatase: 277 U/L — ABNORMAL HIGH (ref 38–126)
Anion gap: 8 (ref 5–15)
BUN: 22 mg/dL (ref 8–23)
CO2: 25 mmol/L (ref 22–32)
Calcium: 9.3 mg/dL (ref 8.9–10.3)
Chloride: 102 mmol/L (ref 98–111)
Creatinine: 1.02 mg/dL (ref 0.61–1.24)
GFR, Estimated: >60 mL/min (ref >60)
Glucose, Bld: 137 mg/dL — ABNORMAL HIGH (ref 70–99)
Potassium: 3.8 mmol/L (ref 3.5–5.1)
Sodium: 135 mmol/L (ref 135–145)
Total Bilirubin: 1.4 mg/dL — ABNORMAL HIGH (ref 0.3–1.2)
Total Protein: 7.3 g/dL (ref 6.5–8.1)

## 2021-05-13 LAB — SAMPLE TO BLOOD BANK

## 2021-05-13 MED ORDER — SODIUM CHLORIDE 0.9% FLUSH
10.0000 mL | Freq: Once | INTRAVENOUS | Status: AC
  Administered 2021-05-13: 14:00:00 10 mL

## 2021-05-13 MED ORDER — SODIUM CHLORIDE 0.9 % IV SOLN: Freq: Once | INTRAVENOUS | Status: AC

## 2021-05-13 MED ORDER — SODIUM CHLORIDE 0.9% FLUSH
10.0000 mL | Freq: Once | INTRAVENOUS | Status: AC | PRN
  Administered 2021-05-13: 17:00:00 10 mL

## 2021-05-13 MED ORDER — ONDANSETRON HCL 4 MG/2ML IJ SOLN
8.0000 mg | Freq: Once | INTRAMUSCULAR | Status: AC
  Administered 2021-05-13: 15:00:00 8 mg via INTRAVENOUS
  Filled 2021-05-13: qty 4

## 2021-05-13 MED ORDER — HEPARIN SOD (PORK) LOCK FLUSH 100 UNIT/ML IV SOLN
500.0000 [IU] | Freq: Once | INTRAVENOUS | Status: AC | PRN
  Administered 2021-05-13: 17:00:00 500 [IU]

## 2021-05-13 NOTE — Progress Notes (Signed)
[facility] Spiritual Care Note  Reached Mr Lawrence Hall by phone per referral from Yancey Flemings for discernment support regarding Mr Lawrence Hall's questions about treatment path.  Reached Mr Lawrence Hall by phone to introduce Spiritual Care and learn about his situation.  We plan to follow up at his treatment on 1/31, after he has had a consultation with an additional oncologist in his search for clarity about prognosis and treatment/supportive care options.  He and his wife also have my direct number in case needs arise or circumstances change.   [place], [place], [facility] Pager [phone] Voicemail [phone]

## 2021-05-13 NOTE — Patient Instructions (Signed)

## 2021-05-14 DIAGNOSIS — Z9889 Other specified postprocedural states: Secondary | ICD-10-CM | POA: Diagnosis not present

## 2021-05-14 DIAGNOSIS — C652 Malignant neoplasm of left renal pelvis: Secondary | ICD-10-CM | POA: Diagnosis not present

## 2021-05-14 DIAGNOSIS — C7951 Secondary malignant neoplasm of bone: Secondary | ICD-10-CM | POA: Diagnosis not present

## 2021-05-14 DIAGNOSIS — Z9189 Other specified personal risk factors, not elsewhere classified: Secondary | ICD-10-CM | POA: Diagnosis not present

## 2021-05-20 ENCOUNTER — Other Ambulatory Visit: Payer: Self-pay

## 2021-05-20 ENCOUNTER — Inpatient Hospital Stay: Payer: PPO

## 2021-05-20 ENCOUNTER — Encounter: Payer: Self-pay | Admitting: General Practice

## 2021-05-20 VITALS — BP 129/69 | HR 58 | Resp 18

## 2021-05-20 DIAGNOSIS — C679 Malignant neoplasm of bladder, unspecified: Secondary | ICD-10-CM | POA: Diagnosis not present

## 2021-05-20 DIAGNOSIS — D649 Anemia, unspecified: Secondary | ICD-10-CM

## 2021-05-20 DIAGNOSIS — Z95828 Presence of other vascular implants and grafts: Secondary | ICD-10-CM

## 2021-05-20 LAB — CBC WITH DIFFERENTIAL (CANCER CENTER ONLY)
Abs Immature Granulocytes: 0.01 10*3/uL (ref 0.00–0.07)
Basophils Absolute: 0 10*3/uL (ref 0.0–0.1)
Basophils Relative: 1 %
Eosinophils Absolute: 0.1 10*3/uL (ref 0.0–0.5)
Eosinophils Relative: 2 %
HCT: 29.7 % — ABNORMAL LOW (ref 39.0–52.0)
Hemoglobin: 9.5 g/dL — ABNORMAL LOW (ref 13.0–17.0)
Immature Granulocytes: 0 %
Lymphocytes Relative: 12 %
Lymphs Abs: 0.8 10*3/uL (ref 0.7–4.0)
MCH: 26.8 pg (ref 26.0–34.0)
MCHC: 32 g/dL (ref 30.0–36.0)
MCV: 83.9 fL (ref 80.0–100.0)
Monocytes Absolute: 0.6 10*3/uL (ref 0.1–1.0)
Monocytes Relative: 10 %
Neutro Abs: 4.7 10*3/uL (ref 1.7–7.7)
Neutrophils Relative %: 75 %
Platelet Count: 257 10*3/uL (ref 150–400)
RBC: 3.54 MIL/uL — ABNORMAL LOW (ref 4.22–5.81)
RDW: 17.5 % — ABNORMAL HIGH (ref 11.5–15.5)
WBC Count: 6.2 10*3/uL (ref 4.0–10.5)
nRBC: 0 % (ref 0.0–0.2)

## 2021-05-20 LAB — SAMPLE TO BLOOD BANK

## 2021-05-20 LAB — CMP (CANCER CENTER ONLY)
ALT: 49 U/L — ABNORMAL HIGH (ref 0–44)
AST: 46 U/L — ABNORMAL HIGH (ref 15–41)
Albumin: 3.3 g/dL — ABNORMAL LOW (ref 3.5–5.0)
Alkaline Phosphatase: 205 U/L — ABNORMAL HIGH (ref 38–126)
Anion gap: 8 (ref 5–15)
BUN: 25 mg/dL — ABNORMAL HIGH (ref 8–23)
CO2: 25 mmol/L (ref 22–32)
Calcium: 9.3 mg/dL (ref 8.9–10.3)
Chloride: 102 mmol/L (ref 98–111)
Creatinine: 1.11 mg/dL (ref 0.61–1.24)
GFR, Estimated: >60 mL/min (ref >60)
Glucose, Bld: 153 mg/dL — ABNORMAL HIGH (ref 70–99)
Potassium: 4 mmol/L (ref 3.5–5.1)
Sodium: 135 mmol/L (ref 135–145)
Total Bilirubin: 1.2 mg/dL (ref 0.3–1.2)
Total Protein: 7 g/dL (ref 6.5–8.1)

## 2021-05-20 MED ORDER — SODIUM CHLORIDE 0.9% FLUSH
10.0000 mL | Freq: Once | INTRAVENOUS | Status: AC | PRN
  Administered 2021-05-20: 10 mL

## 2021-05-20 MED ORDER — HEPARIN SOD (PORK) LOCK FLUSH 100 UNIT/ML IV SOLN: 250.0000 [IU] | Freq: Once | INTRAVENOUS | Status: DC | PRN

## 2021-05-20 MED ORDER — SODIUM CHLORIDE 0.9% FLUSH
10.0000 mL | Freq: Once | INTRAVENOUS | Status: AC
  Administered 2021-05-20: 10 mL

## 2021-05-20 MED ORDER — ONDANSETRON HCL 4 MG/2ML IJ SOLN
8.0000 mg | Freq: Once | INTRAMUSCULAR | Status: AC
  Administered 2021-05-20: 8 mg via INTRAVENOUS
  Filled 2021-05-20: qty 4

## 2021-05-20 MED ORDER — ALTEPLASE 2 MG IJ SOLR: 2.0000 mg | Freq: Once | INTRAMUSCULAR | Status: DC | PRN

## 2021-05-20 MED ORDER — SODIUM CHLORIDE 0.9 % IV SOLN: Freq: Once | INTRAVENOUS | Status: AC

## 2021-05-20 MED ORDER — SODIUM CHLORIDE 0.9% FLUSH: 10.0000 mL | Freq: Once | INTRAVENOUS | Status: DC

## 2021-05-20 MED ORDER — HEPARIN SOD (PORK) LOCK FLUSH 100 UNIT/ML IV SOLN
500.0000 [IU] | Freq: Once | INTRAVENOUS | Status: AC | PRN
  Administered 2021-05-20: 500 [IU]

## 2021-05-20 MED ORDER — SODIUM CHLORIDE 0.9% FLUSH: 3.0000 mL | Freq: Once | INTRAVENOUS | Status: DC | PRN

## 2021-05-20 MED ORDER — HEPARIN SOD (PORK) LOCK FLUSH 100 UNIT/ML IV SOLN: 500.0000 [IU] | Freq: Once | INTRAVENOUS | Status: DC

## 2021-05-20 NOTE — Patient Instructions (Signed)

## 2021-05-20 NOTE — Progress Notes (Signed)
[place] Spiritual Care Note  Followed up with Lawrence Hall in infusion, providing opportunity for him to share and process updates about second opinion and seeking as much clarity as possible about his prognosis timeline. Provided opportunity for him to voice feelings and experiences that he tends not to share elsewhere. We plan to continue to follow up in person at his treatments and for me to call his wife Lawrence Hall for caregiver support.   [place], [place], [facility] Pager [phone] Voicemail [phone]

## 2021-05-21 ENCOUNTER — Encounter: Payer: Self-pay | Admitting: General Practice

## 2021-05-21 NOTE — Progress Notes (Signed)
[facility] Spiritual Care Note  Following Mr Trembath's wife [name] for caregiver support. Mailing packet of [place] support programming to couple as well.   [place], [place], [facility] Pager [phone] Voicemail [phone]

## 2021-05-27 ENCOUNTER — Other Ambulatory Visit: Payer: Self-pay

## 2021-05-27 ENCOUNTER — Encounter: Payer: Self-pay | Admitting: General Practice

## 2021-05-27 ENCOUNTER — Inpatient Hospital Stay: Payer: PPO | Attending: Oncology

## 2021-05-27 ENCOUNTER — Inpatient Hospital Stay: Payer: PPO

## 2021-05-27 VITALS — BP 136/77 | HR 52 | Resp 18

## 2021-05-27 DIAGNOSIS — Z95828 Presence of other vascular implants and grafts: Secondary | ICD-10-CM

## 2021-05-27 DIAGNOSIS — C679 Malignant neoplasm of bladder, unspecified: Secondary | ICD-10-CM | POA: Insufficient documentation

## 2021-05-27 DIAGNOSIS — C787 Secondary malignant neoplasm of liver and intrahepatic bile duct: Secondary | ICD-10-CM | POA: Insufficient documentation

## 2021-05-27 DIAGNOSIS — C7951 Secondary malignant neoplasm of bone: Secondary | ICD-10-CM | POA: Insufficient documentation

## 2021-05-27 DIAGNOSIS — C652 Malignant neoplasm of left renal pelvis: Secondary | ICD-10-CM | POA: Insufficient documentation

## 2021-05-27 DIAGNOSIS — D630 Anemia in neoplastic disease: Secondary | ICD-10-CM | POA: Insufficient documentation

## 2021-05-27 DIAGNOSIS — E785 Hyperlipidemia, unspecified: Secondary | ICD-10-CM

## 2021-05-27 DIAGNOSIS — D649 Anemia, unspecified: Secondary | ICD-10-CM

## 2021-05-27 LAB — CMP (CANCER CENTER ONLY)
ALT: 73 U/L — ABNORMAL HIGH (ref 0–44)
AST: 83 U/L — ABNORMAL HIGH (ref 15–41)
Albumin: 3.3 g/dL — ABNORMAL LOW (ref 3.5–5.0)
Alkaline Phosphatase: 305 U/L — ABNORMAL HIGH (ref 38–126)
Anion gap: 8 (ref 5–15)
BUN: 35 mg/dL — ABNORMAL HIGH (ref 8–23)
CO2: 25 mmol/L (ref 22–32)
Calcium: 9.7 mg/dL (ref 8.9–10.3)
Chloride: 103 mmol/L (ref 98–111)
Creatinine: 1.16 mg/dL (ref 0.61–1.24)
GFR, Estimated: >60 mL/min (ref >60)
Glucose, Bld: 170 mg/dL — ABNORMAL HIGH (ref 70–99)
Potassium: 3.9 mmol/L (ref 3.5–5.1)
Sodium: 136 mmol/L (ref 135–145)
Total Bilirubin: 1.6 mg/dL — ABNORMAL HIGH (ref 0.3–1.2)
Total Protein: 7.2 g/dL (ref 6.5–8.1)

## 2021-05-27 LAB — CBC WITH DIFFERENTIAL (CANCER CENTER ONLY)
Abs Immature Granulocytes: 0.01 10*3/uL (ref 0.00–0.07)
Basophils Absolute: 0 10*3/uL (ref 0.0–0.1)
Basophils Relative: 1 %
Eosinophils Absolute: 0 10*3/uL (ref 0.0–0.5)
Eosinophils Relative: 1 %
HCT: 29.8 % — ABNORMAL LOW (ref 39.0–52.0)
Hemoglobin: 9.6 g/dL — ABNORMAL LOW (ref 13.0–17.0)
Immature Granulocytes: 0 %
Lymphocytes Relative: 12 %
Lymphs Abs: 0.7 10*3/uL (ref 0.7–4.0)
MCH: 27.5 pg (ref 26.0–34.0)
MCHC: 32.2 g/dL (ref 30.0–36.0)
MCV: 85.4 fL (ref 80.0–100.0)
Monocytes Absolute: 0.7 10*3/uL (ref 0.1–1.0)
Monocytes Relative: 10 %
Neutro Abs: 4.8 10*3/uL (ref 1.7–7.7)
Neutrophils Relative %: 76 %
Platelet Count: 238 10*3/uL (ref 150–400)
RBC: 3.49 MIL/uL — ABNORMAL LOW (ref 4.22–5.81)
RDW: 17.9 % — ABNORMAL HIGH (ref 11.5–15.5)
WBC Count: 6.3 10*3/uL (ref 4.0–10.5)
nRBC: 0 % (ref 0.0–0.2)

## 2021-05-27 LAB — SAMPLE TO BLOOD BANK

## 2021-05-27 MED ORDER — SODIUM CHLORIDE 0.9 % IV SOLN: Freq: Once | INTRAVENOUS | Status: AC

## 2021-05-27 MED ORDER — ONDANSETRON HCL 4 MG/2ML IJ SOLN
8.0000 mg | Freq: Once | INTRAMUSCULAR | Status: AC
  Administered 2021-05-27: 8 mg via INTRAVENOUS
  Filled 2021-05-27: qty 4

## 2021-05-27 MED ORDER — HEPARIN SOD (PORK) LOCK FLUSH 100 UNIT/ML IV SOLN
500.0000 [IU] | Freq: Once | INTRAVENOUS | Status: AC | PRN
  Administered 2021-05-27: 500 [IU]

## 2021-05-27 MED ORDER — SODIUM CHLORIDE 0.9% FLUSH
10.0000 mL | Freq: Once | INTRAVENOUS | Status: AC | PRN
  Administered 2021-05-27: 10 mL

## 2021-05-27 MED ORDER — SODIUM CHLORIDE 0.9% FLUSH
10.0000 mL | Freq: Once | INTRAVENOUS | Status: AC
  Administered 2021-05-27: 10 mL

## 2021-05-27 NOTE — Progress Notes (Signed)
[facility] Spiritual Care Note  Followed up with Lawrence Hall in infusion as planned. He welcomes Spiritual Care visits as opportunities to process support, challenges, and ways he is responding to details that he experiences as stressors. Today he noted that he feels weaker than usual, in part because he is having trouble eating much (which can lead to a cycle of nausea and lack of appetite, as he has discussed with his dietician). Provided empathic listening, pastoral reflection, and emotional support. We plan to have standing Spiritual Care visits at his chemo treatments.   [place], [place], [facility] Pager [phone] Voicemail [phone]

## 2021-06-03 ENCOUNTER — Encounter: Payer: Self-pay | Admitting: General Practice

## 2021-06-03 ENCOUNTER — Inpatient Hospital Stay: Payer: PPO

## 2021-06-03 ENCOUNTER — Inpatient Hospital Stay (HOSPITAL_BASED_OUTPATIENT_CLINIC_OR_DEPARTMENT_OTHER): Payer: PPO | Admitting: Oncology

## 2021-06-03 ENCOUNTER — Other Ambulatory Visit: Payer: Self-pay

## 2021-06-03 VITALS — BP 118/68 | HR 58 | Temp 98.0°F | Resp 15 | Ht 72.0 in | Wt 136.6 lb

## 2021-06-03 VITALS — BP 117/76 | HR 77 | Temp 98.0°F | Resp 17

## 2021-06-03 DIAGNOSIS — Z95828 Presence of other vascular implants and grafts: Secondary | ICD-10-CM

## 2021-06-03 DIAGNOSIS — C679 Malignant neoplasm of bladder, unspecified: Secondary | ICD-10-CM

## 2021-06-03 DIAGNOSIS — D649 Anemia, unspecified: Secondary | ICD-10-CM

## 2021-06-03 LAB — CBC WITH DIFFERENTIAL (CANCER CENTER ONLY)
Abs Immature Granulocytes: 0.01 10*3/uL (ref 0.00–0.07)
Basophils Absolute: 0 10*3/uL (ref 0.0–0.1)
Basophils Relative: 0 %
Eosinophils Absolute: 0 10*3/uL (ref 0.0–0.5)
Eosinophils Relative: 1 %
HCT: 28.6 % — ABNORMAL LOW (ref 39.0–52.0)
Hemoglobin: 9.3 g/dL — ABNORMAL LOW (ref 13.0–17.0)
Immature Granulocytes: 0 %
Lymphocytes Relative: 13 %
Lymphs Abs: 0.9 10*3/uL (ref 0.7–4.0)
MCH: 27.6 pg (ref 26.0–34.0)
MCHC: 32.5 g/dL (ref 30.0–36.0)
MCV: 84.9 fL (ref 80.0–100.0)
Monocytes Absolute: 0.6 10*3/uL (ref 0.1–1.0)
Monocytes Relative: 9 %
Neutro Abs: 5.2 10*3/uL (ref 1.7–7.7)
Neutrophils Relative %: 77 %
Platelet Count: 213 10*3/uL (ref 150–400)
RBC: 3.37 MIL/uL — ABNORMAL LOW (ref 4.22–5.81)
RDW: 17.7 % — ABNORMAL HIGH (ref 11.5–15.5)
WBC Count: 6.8 10*3/uL (ref 4.0–10.5)
nRBC: 0 % (ref 0.0–0.2)

## 2021-06-03 LAB — SAMPLE TO BLOOD BANK

## 2021-06-03 LAB — CMP (CANCER CENTER ONLY)
ALT: 107 U/L — ABNORMAL HIGH (ref 0–44)
AST: 108 U/L — ABNORMAL HIGH (ref 15–41)
Albumin: 3 g/dL — ABNORMAL LOW (ref 3.5–5.0)
Alkaline Phosphatase: 225 U/L — ABNORMAL HIGH (ref 38–126)
Anion gap: 8 (ref 5–15)
BUN: 35 mg/dL — ABNORMAL HIGH (ref 8–23)
CO2: 25 mmol/L (ref 22–32)
Calcium: 9.8 mg/dL (ref 8.9–10.3)
Chloride: 102 mmol/L (ref 98–111)
Creatinine: 1.21 mg/dL (ref 0.61–1.24)
GFR, Estimated: >60 mL/min (ref >60)
Glucose, Bld: 171 mg/dL — ABNORMAL HIGH (ref 70–99)
Potassium: 3.6 mmol/L (ref 3.5–5.1)
Sodium: 135 mmol/L (ref 135–145)
Total Bilirubin: 1.4 mg/dL — ABNORMAL HIGH (ref 0.3–1.2)
Total Protein: 7 g/dL (ref 6.5–8.1)

## 2021-06-03 MED ORDER — SODIUM CHLORIDE 0.9% FLUSH
10.0000 mL | Freq: Once | INTRAVENOUS | Status: AC
  Administered 2021-06-03: 10 mL

## 2021-06-03 MED ORDER — ONDANSETRON HCL 4 MG/2ML IJ SOLN: 8.0000 mg | Freq: Once | INTRAMUSCULAR | Status: DC

## 2021-06-03 MED ORDER — SODIUM CHLORIDE 0.9 % IV SOLN: Freq: Once | INTRAVENOUS | Status: AC

## 2021-06-03 MED ORDER — HEPARIN SOD (PORK) LOCK FLUSH 100 UNIT/ML IV SOLN
500.0000 [IU] | Freq: Once | INTRAVENOUS | Status: AC
  Administered 2021-06-03: 500 [IU]

## 2021-06-03 MED ORDER — ONDANSETRON HCL 4 MG/2ML IJ SOLN
8.0000 mg | Freq: Once | INTRAMUSCULAR | Status: AC
  Administered 2021-06-03: 8 mg via INTRAVENOUS
  Filled 2021-06-03: qty 4

## 2021-06-03 NOTE — Patient Instructions (Signed)

## 2021-06-03 NOTE — Progress Notes (Signed)
[facility] Spiritual Care Note  Followed up with Lawrence Hall in infusion. He was experiencing lower energy and mood today because of appetite and weight loss issues, and so is hopeful about his new appetite stimulant prescription. He values pastoral check-ins and opportunities to talk about issues that he doesn't usually get to discuss. We plan to follow up at his next treatment, and I will phone his wife for support at his encouragement, too.   [place], [place], [facility] Pager [phone] Voicemail [phone]

## 2021-06-03 NOTE — Progress Notes (Signed)
Hematology and Oncology Follow Up Visit  Lawrence Hall [id number] May 18, 1945 76 y.o. 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  He was initially diagnosed in 2021.   Prior Therapy: He is status post biopsy obtained on Aug 21, 2019 which showed high-grade urothelial carcinoma of the left renal pelvis.   He status post neoadjuvant chemotherapy utilizing gemcitabine and cisplatin given on day 1 and cisplatin given on day 8 start her on 10/06/2019.   He completed 3 cycles of therapy in August 2021.  He is status post left robotic assisted laparoscopic nephroureterectomy and retroperitoneal lymphadenectomy completed on 01/04/2020.  He was found to have a residual T4 disease with 1 out of 5 lymph nodes involved.  He is status post tumor resection from his right humerus with surgical fixation and rodding completed by Dr. Lovena Le at [facility] completed in June 2022.  The final pathology confirmed the presence of metastatic urothelial carcinoma.  Radiation therapy to be right humerus.  He completed 10 fractions for a total of 30 Gray completed in July 2022.  Pembrolizumab 200 mg started on Sep 13, 2020.  He is currently receiving 400 mg every 6 weeks.  He completed cycle 3 on December 13, 2020.  Padcev 1 mg/kg day 1 and day 15 out of a 28-day cycle started on February 12, 2021.  Therapy was tolerated poorly and he opted to proceed with supportive care only.  Current therapy: Supportive care only.      Interim History: Lawrence Hall is here for a follow-up visit.  Since last visit, he reports few complaints of predominantly generalized fatigue and weakness as well as poor appetite and is developed more weight loss.  He has reported less mobility although his appetite has improved the last 24 hours.  He denied any bone pain or  pathological fractures.  He denies any hospitalizations or illnesses.    Medications: Reviewed without changes. Current Outpatient Medications  Medication Sig Dispense Refill   aspirin EC 81 MG tablet Take 1 tablet (81 mg total) by mouth daily. Swallow whole. (Patient taking differently: Take 81 mg by mouth at bedtime. Swallow whole.) 30 tablet 11   atorvastatin (LIPITOR) 80 MG tablet TAKE 1 TABLET ONCE DAILY AT 6 PM. (Patient taking differently: Take 80 mg by mouth daily at 6 PM.) 90 tablet 3   Cholecalciferol (VITAMIN D) 50 MCG (2000 UT) tablet Take 2,000 Units by mouth daily with lunch.     metoprolol succinate (TOPROL-XL) 50 MG 24 hr tablet Take 1 tablet (50 mg total) by mouth at bedtime. 90 tablet 3   Milk Thistle 1000 MG CAPS Take 1,000 mg by mouth every other day.     mirtazapine (REMERON) 15 MG tablet Take 1 tablet (15 mg total) by mouth at bedtime. 90 tablet 1   nitroGLYCERIN (NITROSTAT) 0.4 MG SL tablet 1 TAB UNDER TONGUE AS NEEDED FOR CHEST PAIN. MAY REPEAT EVERY 5 MIN FOR A TOTAL OF 3 DOSES. (Patient taking differently: Place 0.4 mg under the tongue every 5 (five) minutes as needed for chest pain.) 25 tablet 4   oxyCODONE (OXY IR/ROXICODONE) 5 MG immediate release tablet Take 1 tablet (5 mg total) by mouth every 4 (four) hours as needed for breakthrough pain or moderate pain. 20 tablet 0   polyethylene glycol (MIRALAX / GLYCOLAX) 17 g packet Take 17  g by mouth daily. (Patient taking differently: Take 17 g by mouth daily as needed (constipation).) 14 each 0   prochlorperazine (COMPAZINE) 10 MG tablet TAKE 1 TABLET EVERY 6 HOURS AS NEEDED FOR NAUSEA & VOMITING. 30 tablet 0   No current facility-administered medications for this visit.     Allergies:  Allergies  Allergen Reactions   Other Itching and Other (See Comments)    Pet dander, seasonal allergies = Runny nose, itchy eyes      Physical Exam:     Blood pressure 118/68, pulse (!) 58, temperature 98 F (36.7 C),  resp. rate 15, height 6' (1.829 m), weight 136 lb 9.6 oz (62 kg), SpO2 100 %.       ECOG: 2    General appearance: Comfortable appearing without any discomfort Head: Normocephalic without any trauma Oropharynx: Mucous membranes are moist and pink without any thrush or ulcers. Eyes: Pupils are equal and round reactive to light. Lymph nodes: No cervical, supraclavicular, inguinal or axillary lymphadenopathy.   Heart:regular rate and rhythm.  S1 and S2 without leg edema. Lung: Clear without any rhonchi or wheezes.  No dullness to percussion. Abdomin: Soft, nontender, nondistended with good bowel sounds.  No hepatosplenomegaly. Musculoskeletal: No joint deformity or effusion.  Full range of motion noted. Neurological: No deficits noted on motor, sensory and deep tendon reflex exam. Skin: No petechial rash or dryness.  Appeared moist.                Lab Results: Lab Results  Component Value Date   WBC 6.3 05/27/2021   HGB 9.6 (L) 05/27/2021   HCT 29.8 (L) 05/27/2021   MCV 85.4 05/27/2021   PLT 238 05/27/2021     Chemistry      Component Value Date/Time   NA 136 05/27/2021 1429   NA 137 04/07/2019 1021   K 3.9 05/27/2021 1429   CL 103 05/27/2021 1429   CO2 25 05/27/2021 1429   BUN 35 (H) 05/27/2021 1429   BUN 29 (H) 04/07/2019 1021   CREATININE 1.16 05/27/2021 1429      Component Value Date/Time   CALCIUM 9.7 05/27/2021 1429   ALKPHOS 305 (H) 05/27/2021 1429   AST 83 (H) 05/27/2021 1429   ALT 73 (H) 05/27/2021 1429   BILITOT 1.6 (H) 05/27/2021 1429         Impression and Plan:  76 year old man with:     1. Renal pelvis tumor diagnosed in 2021 with localized disease.  He developed tage IV high-grade urothelial carcinoma with hepatic as well as bone involvement.  He is currently on supportive care only without any additional anticancer treatment.  Risks and benefits of restarting systemic chemotherapy were reviewed at this time.  Complication  associated with Padcev were discussed usually include nausea, fatigue, suppression, neuropathy and hyperglycemia.  I do not believe he is in now good enough shape to stand chemotherapy and have recommended supportive care only.  Transitioning to hospice was also discussed and will be considered.     2.  IV access: Port-A-Cath continues to be in use without any issues.    3.  Failure to thrive and dehydration: Improved with hydration and supportive care.  We will continue to receive IV hydration periodically.  We will start Megace as well to boost his appetite.  Complication including thrombosis and adrenal sufficiency were reiterated.  He is agreeable to proceed.      4.  Anemia: Related to malignancy and chemotherapy.  Hemoglobin is adequate  and does not require transfusion.  5.  Prognosis and goals of care: Any treatment is palliative at this time his prognosis is overall poor with incurable malignancy.  6.  Follow-up: Weekly for hydration and in 4 weeks for MD visit.   30  minutes were spent on this encounter.  The time was dedicated to reviewing laboratory data, disease status update outlining future plan of care discussion.       [name], MD 2/14/202312:25 PM

## 2021-06-04 ENCOUNTER — Other Ambulatory Visit: Payer: Self-pay | Admitting: *Deleted

## 2021-06-04 MED ORDER — MEGESTROL ACETATE 625 MG/5ML PO SUSP: 625.0000 mg | Freq: Every day | ORAL | 0 refills | Status: DC

## 2021-06-06 ENCOUNTER — Telehealth: Payer: Self-pay | Admitting: Oncology

## 2021-06-06 NOTE — Telephone Encounter (Signed)
Scheduled per 02/14 los, patient has been called and voicemail was left. 

## 2021-06-10 ENCOUNTER — Encounter: Payer: Self-pay | Admitting: General Practice

## 2021-06-10 NOTE — Progress Notes (Signed)
[facility] Spiritual Care Note  Returned call from Lawrence Hall, learning that his scheduling problem had just gotten resolved. We plan to follow up at his treatment on 2/28.   [place], [place], [facility] Pager [phone] Voicemail [phone]

## 2021-06-11 ENCOUNTER — Other Ambulatory Visit: Payer: Self-pay | Admitting: Oncology

## 2021-06-11 ENCOUNTER — Inpatient Hospital Stay: Payer: PPO

## 2021-06-11 ENCOUNTER — Other Ambulatory Visit: Payer: Self-pay

## 2021-06-11 ENCOUNTER — Ambulatory Visit: Payer: PPO

## 2021-06-11 ENCOUNTER — Other Ambulatory Visit: Payer: PPO

## 2021-06-11 VITALS — BP 114/71 | HR 81 | Temp 98.0°F | Resp 17 | Wt 138.5 lb

## 2021-06-11 DIAGNOSIS — Z95828 Presence of other vascular implants and grafts: Secondary | ICD-10-CM

## 2021-06-11 DIAGNOSIS — C679 Malignant neoplasm of bladder, unspecified: Secondary | ICD-10-CM | POA: Diagnosis not present

## 2021-06-11 DIAGNOSIS — D649 Anemia, unspecified: Secondary | ICD-10-CM

## 2021-06-11 LAB — SAMPLE TO BLOOD BANK

## 2021-06-11 LAB — CBC WITH DIFFERENTIAL (CANCER CENTER ONLY)
Abs Immature Granulocytes: 0.03 10*3/uL (ref 0.00–0.07)
Basophils Absolute: 0 10*3/uL (ref 0.0–0.1)
Basophils Relative: 0 %
Eosinophils Absolute: 0 10*3/uL (ref 0.0–0.5)
Eosinophils Relative: 1 %
HCT: 28.1 % — ABNORMAL LOW (ref 39.0–52.0)
Hemoglobin: 9.1 g/dL — ABNORMAL LOW (ref 13.0–17.0)
Immature Granulocytes: 0 %
Lymphocytes Relative: 10 %
Lymphs Abs: 0.8 10*3/uL (ref 0.7–4.0)
MCH: 28 pg (ref 26.0–34.0)
MCHC: 32.4 g/dL (ref 30.0–36.0)
MCV: 86.5 fL (ref 80.0–100.0)
Monocytes Absolute: 0.6 10*3/uL (ref 0.1–1.0)
Monocytes Relative: 9 %
Neutro Abs: 5.9 10*3/uL (ref 1.7–7.7)
Neutrophils Relative %: 80 %
Platelet Count: 171 10*3/uL (ref 150–400)
RBC: 3.25 MIL/uL — ABNORMAL LOW (ref 4.22–5.81)
RDW: 18.3 % — ABNORMAL HIGH (ref 11.5–15.5)
WBC Count: 7.4 10*3/uL (ref 4.0–10.5)
nRBC: 0 % (ref 0.0–0.2)

## 2021-06-11 LAB — CMP (CANCER CENTER ONLY)
ALT: 67 U/L — ABNORMAL HIGH (ref 0–44)
AST: 81 U/L — ABNORMAL HIGH (ref 15–41)
Albumin: 3 g/dL — ABNORMAL LOW (ref 3.5–5.0)
Alkaline Phosphatase: 213 U/L — ABNORMAL HIGH (ref 38–126)
Anion gap: 8 (ref 5–15)
BUN: 23 mg/dL (ref 8–23)
CO2: 25 mmol/L (ref 22–32)
Calcium: 10.7 mg/dL — ABNORMAL HIGH (ref 8.9–10.3)
Chloride: 102 mmol/L (ref 98–111)
Creatinine: 1.14 mg/dL (ref 0.61–1.24)
GFR, Estimated: >60 mL/min (ref >60)
Glucose, Bld: 161 mg/dL — ABNORMAL HIGH (ref 70–99)
Potassium: 4 mmol/L (ref 3.5–5.1)
Sodium: 135 mmol/L (ref 135–145)
Total Bilirubin: 2.2 mg/dL — ABNORMAL HIGH (ref 0.3–1.2)
Total Protein: 6.6 g/dL (ref 6.5–8.1)

## 2021-06-11 MED ORDER — HEPARIN SOD (PORK) LOCK FLUSH 100 UNIT/ML IV SOLN
500.0000 [IU] | Freq: Once | INTRAVENOUS | Status: AC | PRN
  Administered 2021-06-11: 500 [IU]

## 2021-06-11 MED ORDER — SODIUM CHLORIDE 0.9 % IV SOLN: Freq: Once | INTRAVENOUS | Status: AC

## 2021-06-11 MED ORDER — SODIUM CHLORIDE 0.9% FLUSH
10.0000 mL | Freq: Once | INTRAVENOUS | Status: AC | PRN
  Administered 2021-06-11: 10 mL

## 2021-06-11 MED ORDER — SODIUM CHLORIDE 0.9% FLUSH
10.0000 mL | Freq: Once | INTRAVENOUS | Status: AC
  Administered 2021-06-11: 10 mL

## 2021-06-11 MED ORDER — ONDANSETRON HCL 4 MG/2ML IJ SOLN
8.0000 mg | Freq: Once | INTRAMUSCULAR | Status: AC
  Administered 2021-06-11: 8 mg via INTRAVENOUS
  Filled 2021-06-11: qty 4

## 2021-06-11 NOTE — Patient Instructions (Signed)

## 2021-06-14 ENCOUNTER — Other Ambulatory Visit: Payer: Self-pay | Admitting: Cardiovascular Disease

## 2021-06-17 ENCOUNTER — Other Ambulatory Visit: Payer: Self-pay

## 2021-06-17 ENCOUNTER — Encounter: Payer: Self-pay | Admitting: General Practice

## 2021-06-17 ENCOUNTER — Inpatient Hospital Stay: Payer: PPO

## 2021-06-17 VITALS — BP 109/67 | HR 61 | Temp 97.8°F | Resp 16

## 2021-06-17 DIAGNOSIS — D649 Anemia, unspecified: Secondary | ICD-10-CM

## 2021-06-17 DIAGNOSIS — Z95828 Presence of other vascular implants and grafts: Secondary | ICD-10-CM

## 2021-06-17 DIAGNOSIS — C679 Malignant neoplasm of bladder, unspecified: Secondary | ICD-10-CM

## 2021-06-17 LAB — CMP (CANCER CENTER ONLY)
ALT: 45 U/L — ABNORMAL HIGH (ref 0–44)
AST: 47 U/L — ABNORMAL HIGH (ref 15–41)
Albumin: 2.8 g/dL — ABNORMAL LOW (ref 3.5–5.0)
Alkaline Phosphatase: 200 U/L — ABNORMAL HIGH (ref 38–126)
Anion gap: 5 (ref 5–15)
BUN: 18 mg/dL (ref 8–23)
CO2: 26 mmol/L (ref 22–32)
Calcium: 10.1 mg/dL (ref 8.9–10.3)
Chloride: 104 mmol/L (ref 98–111)
Creatinine: 1.01 mg/dL (ref 0.61–1.24)
GFR, Estimated: >60 mL/min (ref >60)
Glucose, Bld: 100 mg/dL — ABNORMAL HIGH (ref 70–99)
Potassium: 4.3 mmol/L (ref 3.5–5.1)
Sodium: 135 mmol/L (ref 135–145)
Total Bilirubin: 2.3 mg/dL — ABNORMAL HIGH (ref 0.3–1.2)
Total Protein: 6.5 g/dL (ref 6.5–8.1)

## 2021-06-17 LAB — CBC WITH DIFFERENTIAL (CANCER CENTER ONLY)
Abs Immature Granulocytes: 0.02 10*3/uL (ref 0.00–0.07)
Basophils Absolute: 0 10*3/uL (ref 0.0–0.1)
Basophils Relative: 0 %
Eosinophils Absolute: 0 10*3/uL (ref 0.0–0.5)
Eosinophils Relative: 0 %
HCT: 27.2 % — ABNORMAL LOW (ref 39.0–52.0)
Hemoglobin: 8.6 g/dL — ABNORMAL LOW (ref 13.0–17.0)
Immature Granulocytes: 0 %
Lymphocytes Relative: 10 %
Lymphs Abs: 0.8 10*3/uL (ref 0.7–4.0)
MCH: 28.3 pg (ref 26.0–34.0)
MCHC: 31.6 g/dL (ref 30.0–36.0)
MCV: 89.5 fL (ref 80.0–100.0)
Monocytes Absolute: 0.6 10*3/uL (ref 0.1–1.0)
Monocytes Relative: 9 %
Neutro Abs: 6 10*3/uL (ref 1.7–7.7)
Neutrophils Relative %: 81 %
Platelet Count: 182 10*3/uL (ref 150–400)
RBC: 3.04 MIL/uL — ABNORMAL LOW (ref 4.22–5.81)
RDW: 19 % — ABNORMAL HIGH (ref 11.5–15.5)
WBC Count: 7.5 10*3/uL (ref 4.0–10.5)
nRBC: 0 % (ref 0.0–0.2)

## 2021-06-17 LAB — SAMPLE TO BLOOD BANK

## 2021-06-17 MED ORDER — HEPARIN SOD (PORK) LOCK FLUSH 100 UNIT/ML IV SOLN
500.0000 [IU] | Freq: Once | INTRAVENOUS | Status: AC | PRN
  Administered 2021-06-17: 500 [IU]

## 2021-06-17 MED ORDER — SODIUM CHLORIDE 0.9% FLUSH
10.0000 mL | Freq: Once | INTRAVENOUS | Status: AC | PRN
  Administered 2021-06-17: 10 mL

## 2021-06-17 MED ORDER — ONDANSETRON HCL 4 MG/2ML IJ SOLN
8.0000 mg | Freq: Once | INTRAMUSCULAR | Status: AC
  Administered 2021-06-17: 8 mg via INTRAVENOUS
  Filled 2021-06-17: qty 4

## 2021-06-17 MED ORDER — SODIUM CHLORIDE 0.9% FLUSH
10.0000 mL | Freq: Once | INTRAVENOUS | Status: AC
  Administered 2021-06-17: 10 mL

## 2021-06-17 MED ORDER — SODIUM CHLORIDE 0.9 % IV SOLN: Freq: Once | INTRAVENOUS | Status: AC

## 2021-06-17 NOTE — Patient Instructions (Signed)

## 2021-06-17 NOTE — Progress Notes (Signed)
[facility] Spiritual Care Note  Followed up with Lawrence Hall in infusion as planned. He appeared lower energy today and reports some success with taking his new appetite-stimulating medication every other day. Supporting his wife [name] continues to be a top concern of his. I will continue to be available for her as well. Plan to follow up with Lawrence Hall at his next treatment for support and encouragement.   [place], [place], [facility] Pager [phone] Voicemail [phone]

## 2021-06-20 ENCOUNTER — Other Ambulatory Visit: Payer: Self-pay

## 2021-06-20 ENCOUNTER — Telehealth: Payer: Self-pay | Admitting: *Deleted

## 2021-06-20 ENCOUNTER — Telehealth: Payer: Self-pay | Admitting: Family Medicine

## 2021-06-20 ENCOUNTER — Other Ambulatory Visit: Payer: Self-pay | Admitting: *Deleted

## 2021-06-20 ENCOUNTER — Inpatient Hospital Stay: Payer: PPO | Attending: Oncology

## 2021-06-20 DIAGNOSIS — R112 Nausea with vomiting, unspecified: Secondary | ICD-10-CM

## 2021-06-20 DIAGNOSIS — C652 Malignant neoplasm of left renal pelvis: Secondary | ICD-10-CM | POA: Diagnosis not present

## 2021-06-20 MED ORDER — HEPARIN SOD (PORK) LOCK FLUSH 100 UNIT/ML IV SOLN
500.0000 [IU] | Freq: Once | INTRAVENOUS | Status: AC
  Administered 2021-06-20: 500 [IU] via INTRAVENOUS

## 2021-06-20 MED ORDER — SODIUM CHLORIDE 0.9 % IV SOLN: Freq: Once | INTRAVENOUS | Status: AC

## 2021-06-20 MED ORDER — SODIUM CHLORIDE 0.9% FLUSH
10.0000 mL | Freq: Once | INTRAVENOUS | Status: AC
  Administered 2021-06-20: 10 mL via INTRAVENOUS

## 2021-06-20 MED ORDER — OXYCODONE HCL 5 MG PO TABS
5.0000 mg | ORAL_TABLET | Freq: Once | ORAL | Status: AC
  Administered 2021-06-20: 5 mg via ORAL
  Filled 2021-06-20: qty 1

## 2021-06-20 NOTE — Telephone Encounter (Signed)
Pt wife called stating pt has been having extreme nausea and vomiting even with taking antiemetics. Pt was seen by PCP and stated pt may need IVF. Per Dr.Shadad OK for pt to receive IVF. Dexter charge made aware and schedule pt. Pt wife was called and notified.  ?

## 2021-06-20 NOTE — Patient Instructions (Signed)

## 2021-06-20 NOTE — Telephone Encounter (Signed)
Spoke with the patient and informed him he should go to the ER immediately for evaluation especially due to his symptoms and the duration. Offered to give locations of one closer to his home and he hung up.  ?

## 2021-06-20 NOTE — Telephone Encounter (Signed)
Triage Nurse call and stated pt have vomited at least 12 times since 4 am and urinated 8-9 hrs ago and refuse to go to ER.She stated pt want something call in for vomiting. ?

## 2021-06-23 NOTE — Telephone Encounter (Signed)
---  Caller states spouse has been vomiting. Has been ?taking nausea medication. feels very warm. Vomited at ?least 12 times. Vomiting started around 4 am. urinated ?8-9 hrs ago. has abd pain. ? ?06/20/2021 1:30:07 PM Go to ED Now Raphael Gibney, RN, Vanita Ingles ? ?Comments ?User: Dannielle Burn, RN Date/Time Eilene Ghazi Time): 06/20/2021 1:22:59 PM ?pt does not want to go to the ER. ? ?User: Dannielle Burn, RN Date/Time Eilene Ghazi Time): 06/20/2021 1:29:49 PM ?Called back line and gave report to Joycelyn Schmid that pt has vomited at least 12 times since 4 am. urinated 8-9 hrs ago. ?Has abd pain. may have vomited coffee ground emesis. has been taking nausea medication from oncologist which ?is not helping. Triage outcome go to ER now. pt does not want to go to the ER but wants medication called in to ?help him keep something on his stomach. ? ?Referrals ?GO TO FACILITY REFUSED ?

## 2021-06-24 ENCOUNTER — Other Ambulatory Visit: Payer: Self-pay | Admitting: *Deleted

## 2021-06-24 ENCOUNTER — Inpatient Hospital Stay: Payer: PPO

## 2021-06-24 ENCOUNTER — Encounter (HOSPITAL_COMMUNITY): Payer: Self-pay

## 2021-06-24 ENCOUNTER — Other Ambulatory Visit: Payer: Self-pay

## 2021-06-24 ENCOUNTER — Emergency Department (HOSPITAL_COMMUNITY): Payer: PPO

## 2021-06-24 ENCOUNTER — Inpatient Hospital Stay: Payer: PPO | Admitting: Dietician

## 2021-06-24 ENCOUNTER — Inpatient Hospital Stay (HOSPITAL_COMMUNITY)
Admission: EM | Admit: 2021-06-24 | Discharge: 2021-07-07 | DRG: 308 | Disposition: A | Discharge status: Hospice | Payer: PPO | Source: Ambulatory Visit | Attending: Family Medicine | Admitting: Family Medicine

## 2021-06-24 VITALS — BP 106/74 | HR 150 | Temp 97.5°F | Resp 28 | Wt 143.8 lb

## 2021-06-24 DIAGNOSIS — D630 Anemia in neoplastic disease: Secondary | ICD-10-CM | POA: Diagnosis present

## 2021-06-24 DIAGNOSIS — C787 Secondary malignant neoplasm of liver and intrahepatic bile duct: Secondary | ICD-10-CM | POA: Diagnosis present

## 2021-06-24 DIAGNOSIS — Z681 Body mass index (BMI) 19 or less, adult: Secondary | ICD-10-CM | POA: Diagnosis not present

## 2021-06-24 DIAGNOSIS — M19041 Primary osteoarthritis, right hand: Secondary | ICD-10-CM | POA: Diagnosis not present

## 2021-06-24 DIAGNOSIS — I480 Paroxysmal atrial fibrillation: Principal | ICD-10-CM | POA: Diagnosis present

## 2021-06-24 DIAGNOSIS — D649 Anemia, unspecified: Secondary | ICD-10-CM

## 2021-06-24 DIAGNOSIS — I5022 Chronic systolic (congestive) heart failure: Secondary | ICD-10-CM | POA: Diagnosis present

## 2021-06-24 DIAGNOSIS — M19042 Primary osteoarthritis, left hand: Secondary | ICD-10-CM | POA: Diagnosis present

## 2021-06-24 DIAGNOSIS — Z515 Encounter for palliative care: Secondary | ICD-10-CM | POA: Diagnosis not present

## 2021-06-24 DIAGNOSIS — E871 Hypo-osmolality and hyponatremia: Secondary | ICD-10-CM | POA: Diagnosis present

## 2021-06-24 DIAGNOSIS — K7210 Chronic hepatic failure without coma: Secondary | ICD-10-CM | POA: Diagnosis present

## 2021-06-24 DIAGNOSIS — C7910 Secondary malignant neoplasm of unspecified urinary organs: Secondary | ICD-10-CM | POA: Diagnosis not present

## 2021-06-24 DIAGNOSIS — Z7982 Long term (current) use of aspirin: Secondary | ICD-10-CM

## 2021-06-24 DIAGNOSIS — I10 Essential (primary) hypertension: Secondary | ICD-10-CM | POA: Diagnosis present

## 2021-06-24 DIAGNOSIS — R531 Weakness: Secondary | ICD-10-CM

## 2021-06-24 DIAGNOSIS — L89152 Pressure ulcer of sacral region, stage 2: Secondary | ICD-10-CM | POA: Diagnosis not present

## 2021-06-24 DIAGNOSIS — Z85828 Personal history of other malignant neoplasm of skin: Secondary | ICD-10-CM

## 2021-06-24 DIAGNOSIS — J189 Pneumonia, unspecified organism: Secondary | ICD-10-CM | POA: Diagnosis not present

## 2021-06-24 DIAGNOSIS — K8020 Calculus of gallbladder without cholecystitis without obstruction: Secondary | ICD-10-CM | POA: Diagnosis present

## 2021-06-24 DIAGNOSIS — I484 Atypical atrial flutter: Secondary | ICD-10-CM | POA: Diagnosis not present

## 2021-06-24 DIAGNOSIS — K7290 Hepatic failure, unspecified without coma: Secondary | ICD-10-CM

## 2021-06-24 DIAGNOSIS — C679 Malignant neoplasm of bladder, unspecified: Secondary | ICD-10-CM

## 2021-06-24 DIAGNOSIS — C7951 Secondary malignant neoplasm of bone: Secondary | ICD-10-CM | POA: Diagnosis present

## 2021-06-24 DIAGNOSIS — Z8042 Family history of malignant neoplasm of prostate: Secondary | ICD-10-CM

## 2021-06-24 DIAGNOSIS — I110 Hypertensive heart disease with heart failure: Secondary | ICD-10-CM | POA: Diagnosis not present

## 2021-06-24 DIAGNOSIS — Z8249 Family history of ischemic heart disease and other diseases of the circulatory system: Secondary | ICD-10-CM

## 2021-06-24 DIAGNOSIS — E860 Dehydration: Secondary | ICD-10-CM | POA: Diagnosis present

## 2021-06-24 DIAGNOSIS — R627 Adult failure to thrive: Secondary | ICD-10-CM | POA: Diagnosis not present

## 2021-06-24 DIAGNOSIS — Z953 Presence of xenogenic heart valve: Secondary | ICD-10-CM

## 2021-06-24 DIAGNOSIS — Z811 Family history of alcohol abuse and dependence: Secondary | ICD-10-CM

## 2021-06-24 DIAGNOSIS — Z951 Presence of aortocoronary bypass graft: Secondary | ICD-10-CM

## 2021-06-24 DIAGNOSIS — I2510 Atherosclerotic heart disease of native coronary artery without angina pectoris: Secondary | ICD-10-CM | POA: Diagnosis not present

## 2021-06-24 DIAGNOSIS — L8990 Pressure ulcer of unspecified site, unspecified stage: Secondary | ICD-10-CM | POA: Insufficient documentation

## 2021-06-24 DIAGNOSIS — Z95828 Presence of other vascular implants and grafts: Secondary | ICD-10-CM

## 2021-06-24 DIAGNOSIS — Z8550 Personal history of malignant neoplasm of unspecified urinary tract organ: Secondary | ICD-10-CM

## 2021-06-24 DIAGNOSIS — C652 Malignant neoplasm of left renal pelvis: Secondary | ICD-10-CM | POA: Diagnosis present

## 2021-06-24 DIAGNOSIS — Z79899 Other long term (current) drug therapy: Secondary | ICD-10-CM

## 2021-06-24 DIAGNOSIS — I429 Cardiomyopathy, unspecified: Secondary | ICD-10-CM | POA: Diagnosis not present

## 2021-06-24 DIAGNOSIS — F32A Depression, unspecified: Secondary | ICD-10-CM | POA: Diagnosis present

## 2021-06-24 DIAGNOSIS — K7200 Acute and subacute hepatic failure without coma: Secondary | ICD-10-CM | POA: Diagnosis present

## 2021-06-24 DIAGNOSIS — E785 Hyperlipidemia, unspecified: Secondary | ICD-10-CM | POA: Diagnosis present

## 2021-06-24 DIAGNOSIS — R4182 Altered mental status, unspecified: Secondary | ICD-10-CM | POA: Diagnosis not present

## 2021-06-24 DIAGNOSIS — R0609 Other forms of dyspnea: Secondary | ICD-10-CM | POA: Diagnosis not present

## 2021-06-24 DIAGNOSIS — R509 Fever, unspecified: Secondary | ICD-10-CM | POA: Diagnosis not present

## 2021-06-24 DIAGNOSIS — I4892 Unspecified atrial flutter: Secondary | ICD-10-CM | POA: Diagnosis not present

## 2021-06-24 DIAGNOSIS — R54 Age-related physical debility: Secondary | ICD-10-CM | POA: Diagnosis present

## 2021-06-24 DIAGNOSIS — I951 Orthostatic hypotension: Secondary | ICD-10-CM | POA: Diagnosis not present

## 2021-06-24 DIAGNOSIS — C689 Malignant neoplasm of urinary organ, unspecified: Secondary | ICD-10-CM | POA: Diagnosis not present

## 2021-06-24 DIAGNOSIS — I4891 Unspecified atrial fibrillation: Principal | ICD-10-CM

## 2021-06-24 DIAGNOSIS — Z66 Do not resuscitate: Secondary | ICD-10-CM | POA: Diagnosis not present

## 2021-06-24 DIAGNOSIS — R079 Chest pain, unspecified: Secondary | ICD-10-CM | POA: Diagnosis not present

## 2021-06-24 DIAGNOSIS — Z8546 Personal history of malignant neoplasm of prostate: Secondary | ICD-10-CM

## 2021-06-24 DIAGNOSIS — J45909 Unspecified asthma, uncomplicated: Secondary | ICD-10-CM | POA: Diagnosis not present

## 2021-06-24 DIAGNOSIS — Z7189 Other specified counseling: Secondary | ICD-10-CM | POA: Diagnosis not present

## 2021-06-24 LAB — BASIC METABOLIC PANEL
Anion gap: 4 — ABNORMAL LOW (ref 5–15)
BUN: 19 mg/dL (ref 8–23)
CO2: 23 mmol/L (ref 22–32)
Calcium: 9.1 mg/dL (ref 8.9–10.3)
Chloride: 103 mmol/L (ref 98–111)
Creatinine, Ser: 0.87 mg/dL (ref 0.61–1.24)
GFR, Estimated: >60 mL/min (ref >60)
Glucose, Bld: 103 mg/dL — ABNORMAL HIGH (ref 70–99)
Potassium: 3.9 mmol/L (ref 3.5–5.1)
Sodium: 130 mmol/L — ABNORMAL LOW (ref 135–145)

## 2021-06-24 LAB — SAMPLE TO BLOOD BANK

## 2021-06-24 LAB — CMP (CANCER CENTER ONLY)
ALT: 43 U/L (ref 0–44)
AST: 42 U/L — ABNORMAL HIGH (ref 15–41)
Albumin: 2.7 g/dL — ABNORMAL LOW (ref 3.5–5.0)
Alkaline Phosphatase: 265 U/L — ABNORMAL HIGH (ref 38–126)
Anion gap: 6 (ref 5–15)
BUN: 19 mg/dL (ref 8–23)
CO2: 25 mmol/L (ref 22–32)
Calcium: 10 mg/dL (ref 8.9–10.3)
Chloride: 103 mmol/L (ref 98–111)
Creatinine: 0.93 mg/dL (ref 0.61–1.24)
GFR, Estimated: >60 mL/min (ref >60)
Glucose, Bld: 102 mg/dL — ABNORMAL HIGH (ref 70–99)
Potassium: 4 mmol/L (ref 3.5–5.1)
Sodium: 134 mmol/L — ABNORMAL LOW (ref 135–145)
Total Bilirubin: 5.2 mg/dL (ref 0.3–1.2)
Total Protein: 6 g/dL — ABNORMAL LOW (ref 6.5–8.1)

## 2021-06-24 LAB — CBC WITH DIFFERENTIAL/PLATELET
Abs Immature Granulocytes: 0.02 10*3/uL (ref 0.00–0.07)
Basophils Absolute: 0 10*3/uL (ref 0.0–0.1)
Basophils Relative: 0 %
Eosinophils Absolute: 0 10*3/uL (ref 0.0–0.5)
Eosinophils Relative: 0 %
HCT: 23.7 % — ABNORMAL LOW (ref 39.0–52.0)
Hemoglobin: 7.6 g/dL — ABNORMAL LOW (ref 13.0–17.0)
Immature Granulocytes: 0 %
Lymphocytes Relative: 13 %
Lymphs Abs: 0.9 10*3/uL (ref 0.7–4.0)
MCH: 29.7 pg (ref 26.0–34.0)
MCHC: 32.1 g/dL (ref 30.0–36.0)
MCV: 92.6 fL (ref 80.0–100.0)
Monocytes Absolute: 0.6 10*3/uL (ref 0.1–1.0)
Monocytes Relative: 9 %
Neutro Abs: 5.4 10*3/uL (ref 1.7–7.7)
Neutrophils Relative %: 78 %
Platelets: 179 10*3/uL (ref 150–400)
RBC: 2.56 MIL/uL — ABNORMAL LOW (ref 4.22–5.81)
RDW: 19.6 % — ABNORMAL HIGH (ref 11.5–15.5)
WBC: 7.1 10*3/uL (ref 4.0–10.5)
nRBC: 0 % (ref 0.0–0.2)

## 2021-06-24 LAB — CBC WITH DIFFERENTIAL (CANCER CENTER ONLY)
Abs Immature Granulocytes: 0.04 10*3/uL (ref 0.00–0.07)
Basophils Absolute: 0 10*3/uL (ref 0.0–0.1)
Basophils Relative: 0 %
Eosinophils Absolute: 0 10*3/uL (ref 0.0–0.5)
Eosinophils Relative: 0 %
HCT: 23.4 % — ABNORMAL LOW (ref 39.0–52.0)
Hemoglobin: 7.8 g/dL — ABNORMAL LOW (ref 13.0–17.0)
Immature Granulocytes: 1 %
Lymphocytes Relative: 12 %
Lymphs Abs: 0.8 10*3/uL (ref 0.7–4.0)
MCH: 29.5 pg (ref 26.0–34.0)
MCHC: 33.3 g/dL (ref 30.0–36.0)
MCV: 88.6 fL (ref 80.0–100.0)
Monocytes Absolute: 0.5 10*3/uL (ref 0.1–1.0)
Monocytes Relative: 8 %
Neutro Abs: 5.4 10*3/uL (ref 1.7–7.7)
Neutrophils Relative %: 79 %
Platelet Count: 203 10*3/uL (ref 150–400)
RBC: 2.64 MIL/uL — ABNORMAL LOW (ref 4.22–5.81)
RDW: 19.1 % — ABNORMAL HIGH (ref 11.5–15.5)
WBC Count: 6.8 10*3/uL (ref 4.0–10.5)
nRBC: 0 % (ref 0.0–0.2)

## 2021-06-24 LAB — MAGNESIUM: Magnesium: 1.9 mg/dL (ref 1.7–2.4)

## 2021-06-24 LAB — TROPONIN I (HIGH SENSITIVITY): Troponin I (High Sensitivity): 11 ng/L (ref <18)

## 2021-06-24 LAB — BRAIN NATRIURETIC PEPTIDE: B Natriuretic Peptide: 341.4 pg/mL — ABNORMAL HIGH (ref 0.0–100.0)

## 2021-06-24 MED ORDER — POLYETHYLENE GLYCOL 3350 17 G PO PACK
17.0000 g | PACK | Freq: Two times a day (BID) | ORAL | Status: DC
  Administered 2021-06-25 – 2021-07-03 (×5): 17 g via ORAL
  Filled 2021-06-24 (×12): qty 1

## 2021-06-24 MED ORDER — ASPIRIN EC 81 MG PO TBEC
81.0000 mg | DELAYED_RELEASE_TABLET | Freq: Every day | ORAL | Status: DC
  Administered 2021-06-24 – 2021-07-03 (×9): 81 mg via ORAL
  Filled 2021-06-24 (×9): qty 1

## 2021-06-24 MED ORDER — SODIUM CHLORIDE 0.9% FLUSH
10.0000 mL | Freq: Two times a day (BID) | INTRAVENOUS | Status: DC
  Administered 2021-06-24: 10 mL
  Administered 2021-06-25: 40 mL
  Administered 2021-06-26 – 2021-07-02 (×6): 10 mL

## 2021-06-24 MED ORDER — DILTIAZEM HCL-DEXTROSE 125-5 MG/125ML-% IV SOLN (PREMIX)
5.0000 mg/h | INTRAVENOUS | Status: DC
  Administered 2021-06-24 – 2021-06-26 (×3): 5 mg/h via INTRAVENOUS
  Filled 2021-06-24 (×4): qty 125

## 2021-06-24 MED ORDER — ONDANSETRON HCL 4 MG/2ML IJ SOLN: 8.0000 mg | Freq: Once | INTRAMUSCULAR | Status: AC

## 2021-06-24 MED ORDER — MIRTAZAPINE 15 MG PO TABS
15.0000 mg | ORAL_TABLET | Freq: Every day | ORAL | Status: DC
  Administered 2021-06-24 – 2021-07-01 (×6): 15 mg via ORAL
  Filled 2021-06-24 (×6): qty 1

## 2021-06-24 MED ORDER — HEPARIN BOLUS VIA INFUSION
4100.0000 [IU] | Freq: Once | INTRAVENOUS | Status: DC
  Filled 2021-06-24: qty 4100

## 2021-06-24 MED ORDER — HEPARIN SOD (PORK) LOCK FLUSH 100 UNIT/ML IV SOLN: 500.0000 [IU] | Freq: Once | INTRAVENOUS | Status: AC

## 2021-06-24 MED ORDER — ENOXAPARIN SODIUM 40 MG/0.4ML IJ SOSY: 40.0000 mg | PREFILLED_SYRINGE | INTRAMUSCULAR | Status: DC

## 2021-06-24 MED ORDER — DILTIAZEM LOAD VIA INFUSION
10.0000 mg | Freq: Once | INTRAVENOUS | Status: AC
  Administered 2021-06-24: 10 mg via INTRAVENOUS
  Filled 2021-06-24: qty 10

## 2021-06-24 MED ORDER — SODIUM CHLORIDE 0.9 % IV BOLUS
1000.0000 mL | Freq: Once | INTRAVENOUS | Status: AC
  Administered 2021-06-24: 1000 mL via INTRAVENOUS

## 2021-06-24 MED ORDER — ONDANSETRON HCL 4 MG/2ML IJ SOLN
4.0000 mg | Freq: Once | INTRAMUSCULAR | Status: AC
  Administered 2021-06-24: 4 mg via INTRAVENOUS
  Filled 2021-06-24: qty 2

## 2021-06-24 MED ORDER — SODIUM CHLORIDE 0.9 % IV SOLN: Freq: Once | INTRAVENOUS | Status: AC

## 2021-06-24 MED ORDER — METOPROLOL TARTRATE 50 MG PO TABS
50.0000 mg | ORAL_TABLET | Freq: Two times a day (BID) | ORAL | Status: AC
  Administered 2021-06-24 – 2021-06-28 (×9): 50 mg via ORAL
  Filled 2021-06-24 (×9): qty 1

## 2021-06-24 MED ORDER — ACETAMINOPHEN 650 MG RE SUPP: 650.0000 mg | Freq: Four times a day (QID) | RECTAL | Status: DC | PRN

## 2021-06-24 MED ORDER — ONDANSETRON HCL 4 MG/2ML IJ SOLN
4.0000 mg | Freq: Four times a day (QID) | INTRAMUSCULAR | Status: DC | PRN
  Administered 2021-06-25 – 2021-06-27 (×2): 4 mg via INTRAVENOUS
  Filled 2021-06-24 (×2): qty 2

## 2021-06-24 MED ORDER — HEPARIN (PORCINE) 25000 UT/250ML-% IV SOLN
1000.0000 [IU]/h | INTRAVENOUS | Status: DC
  Administered 2021-06-24: 1000 [IU]/h via INTRAVENOUS
  Filled 2021-06-24: qty 250

## 2021-06-24 MED ORDER — ONDANSETRON HCL 4 MG/2ML IJ SOLN: 8.0000 mg | Freq: Once | INTRAMUSCULAR | Status: DC

## 2021-06-24 MED ORDER — ATORVASTATIN CALCIUM 40 MG PO TABS
80.0000 mg | ORAL_TABLET | Freq: Every day | ORAL | Status: DC
  Administered 2021-06-25 – 2021-07-04 (×10): 80 mg via ORAL
  Filled 2021-06-24 (×10): qty 2

## 2021-06-24 MED ORDER — SODIUM CHLORIDE 0.9% FLUSH
10.0000 mL | INTRAVENOUS | Status: DC | PRN
  Administered 2021-07-04: 10 mL

## 2021-06-24 MED ORDER — SODIUM CHLORIDE 0.9% FLUSH: 10.0000 mL | Freq: Once | INTRAVENOUS | Status: AC

## 2021-06-24 MED ORDER — ACETAMINOPHEN 325 MG PO TABS
650.0000 mg | ORAL_TABLET | Freq: Four times a day (QID) | ORAL | Status: DC | PRN
  Administered 2021-06-25: 325 mg via ORAL
  Administered 2021-06-30 – 2021-07-03 (×3): 650 mg via ORAL
  Filled 2021-06-24 (×5): qty 2

## 2021-06-24 MED ORDER — BISACODYL 5 MG PO TBEC
10.0000 mg | DELAYED_RELEASE_TABLET | Freq: Once | ORAL | Status: AC
  Administered 2021-06-24: 10 mg via ORAL
  Filled 2021-06-24: qty 2

## 2021-06-24 MED ORDER — CHLORHEXIDINE GLUCONATE CLOTH 2 % EX PADS
6.0000 | MEDICATED_PAD | Freq: Every day | CUTANEOUS | Status: DC
  Administered 2021-06-25 – 2021-07-07 (×13): 6 via TOPICAL

## 2021-06-24 MED ORDER — HEPARIN SOD (PORK) LOCK FLUSH 100 UNIT/ML IV SOLN: 500.0000 [IU] | Freq: Once | INTRAVENOUS | Status: DC | PRN

## 2021-06-24 MED ORDER — SODIUM CHLORIDE 0.9% FLUSH
10.0000 mL | Freq: Once | INTRAVENOUS | Status: AC
  Administered 2021-06-24: 10 mL

## 2021-06-24 MED ORDER — HEPARIN BOLUS VIA INFUSION
3500.0000 [IU] | Freq: Once | INTRAVENOUS | Status: AC
  Administered 2021-06-24: 3500 [IU] via INTRAVENOUS
  Filled 2021-06-24: qty 3500

## 2021-06-24 MED ORDER — SODIUM CHLORIDE 0.9% FLUSH: 10.0000 mL | Freq: Once | INTRAVENOUS | Status: DC | PRN

## 2021-06-24 MED ORDER — SODIUM CHLORIDE 0.9 % IV SOLN: Freq: Once | INTRAVENOUS | Status: DC

## 2021-06-24 MED ORDER — ENSURE ENLIVE PO LIQD
237.0000 mL | Freq: Two times a day (BID) | ORAL | Status: DC
  Administered 2021-06-25: 237 mL via ORAL

## 2021-06-24 MED ORDER — ONDANSETRON HCL 4 MG PO TABS: 4.0000 mg | ORAL_TABLET | Freq: Four times a day (QID) | ORAL | Status: DC | PRN

## 2021-06-24 MED ORDER — OXYCODONE HCL 5 MG PO TABS
5.0000 mg | ORAL_TABLET | ORAL | Status: DC | PRN
Start: 2021-06-24 — End: 2021-07-05

## 2021-06-24 NOTE — Progress Notes (Addendum)
Pt here for rehydration, upon checking VS pulse noted to be 28 and then increased to 150 within a few seconds. Myrtle, RN checked pulse manually and found to be irregular. Dr Alen Blew notified and EKG ordered. EKG of A. flutter taken to Dr Alen Blew, per Dr Alen Blew go to ED for additional evaluation. Pt transported via wheelchair to ED and report given to ED nurse. Pt wife contacted and notified of pt being transferred to ED. ?

## 2021-06-24 NOTE — H&P (Addendum)
History and Physical    Patient: Lawrence Hall YKD:[id number] DOB: Apr 08, 1946 DOA: 06/24/2021 DOS: the patient was seen and examined on 06/24/2021 PCP: [name], MD  Patient coming from: [facility]fusion center.   Chief Complaint:  palpitations and dyspnea.  Chief Complaint  Patient presents with   Tachycardia   HPI: Lawrence Hall is a 76 y.o. 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 Hypertension, dyslipidemia, paroxysmal atrial fibrillation, coronary artery disease sp CABG, and bioprosthetic aortic valve replacement.   Patient has been very weak and deconditioned, poor mobility due to generalized weakness, poor balance and failure to thrive. He has been treated with weekly IV fluids for hydration per Dr Alen Blew as part of palliative care.  Today before getting to the infusion center he felt palpitations, moderate in intensity, associated with dyspnea but not chest pain, no improving or worsening factors. At the infusion center he was noted to have a rapid heart rate with irregular rhythm and he was transferred to the ED for further evaluation.   At home patient had pedal edema, but no PND or orthopnea. He has been diagnosed with post operative atrial fibrillation and followed by cardiology as outpatient with Dr. Sallyanne Kuster.     Review of Systems: As mentioned in the history of present illness. All other systems reviewed and are negative. Past Medical History:  Diagnosis Date   Arthritis    mild hands   Asthma    pt denies 4/21    Chest pain 07/25/2012   Coronary artery disease    Dysrhythmia    PAC'S, hx of atrial fib after OHS 04/2018   ED (erectile dysfunction)    H/O hiatal hernia    Heart murmur    Hypercholesterolemia    Hypertension    MARGINALLY HIGH - NO MEDS -MONITORS   Metastatic cancer to bone ([place]) dx'd 07/2020   rt humerus   Myocardial  infarction ([place])    2017    Pneumonia    hx of 2018    PONV (postoperative nausea and vomiting)    Prostate cancer ([place]) 03/27/2011   prostate bx=adenocarcinoma,   Skin cancer 2008   basal cell face /removed   Sleep apnea    borderline sleep apnea per patient no devices    Urothelial cancer ([place]) dx'd 05/2019   left renal ca   Past Surgical History:  Procedure Laterality Date   AORTIC VALVE REPLACEMENT N/A 04/29/2018   Procedure: AORTIC VALVE REPLACEMENT (AVR) using INSPIRIS RESILIA  AORTIC VALVE 35m;  Surgeon: VIvin Poot MD;  [facility]  Service: Open Heart Surgery;  Laterality: N/A;   basal cell cancer  2008   inside nose   CARDIAC CATHETERIZATION N/A 06/03/2015   Procedure: Left Heart Cath and Coronary Angiography;  Surgeon: TTroy Sine MD;  [facility];  Service: Cardiovascular;  Laterality: N/A;   CATARACT EXTRACTION, BILATERAL  2007   CORONARY ARTERY BYPASS GRAFT N/A 04/29/2018   Procedure: CORONARY ARTERY BYPASS GRAFTING (CABG) X 3; Using Left Internal Mammary Artery (LIMA) and Right Leg Greater Saphenous Vein Graft (SVG);  LIMA to LAD, SVG to OM1, SVG to RCA,;  Surgeon: VIvin Poot MD;  [facility]  Service: Open Heart Surgery;  Laterality: N/A;   CYSTOSCOPY/RETROGRADE/URETEROSCOPY Left 08/21/2019   Procedure: CYSTOSCOPY/RETROGRADE/URETEROSCOPY/  LEFT BIOPSY/BRUSH BIOPSY/RENAL WASHINGS/ AND STENT PLACEMENT;  Surgeon: BRaynelle Bring MD;  [facility];  Service: Urology;  Laterality: Left;   IR IMAGING GUIDED PORT INSERTION  09/28/2019   RIGHT/LEFT HEART CATH AND CORONARY ANGIOGRAPHY N/A 04/26/2018   Procedure: RIGHT/LEFT HEART CATH AND CORONARY ANGIOGRAPHY;  Surgeon: [name] Sine, MD;  Location: [facility];  Service: Cardiovascular;  Laterality: N/A;   ROBOT ASSISTED LAPAROSCOPIC RADICAL PROSTATECTOMY  08/10/2011   Procedure: ROBOTIC ASSISTED LAPAROSCOPIC RADICAL PROSTATECTOMY LEVEL 2;  Surgeon: Dutch Gray, MD;  [facility];  Service: Urology;  Laterality: N/A;   ROBOT ASSITED LAPAROSCOPIC NEPHROURETERECTOMY Left 01/04/2020   Procedure: XI ROBOT ASSITED LAPAROSCOPIC NEPHROURETERECTOMY;  Surgeon: Raynelle Bring, MD;  [facility];  Service: Urology;  Laterality: Left;  NEEDS 240 MIN FOR PROCEDURE   TEE WITHOUT CARDIOVERSION N/A 04/29/2018   Procedure: TRANSESOPHAGEAL ECHOCARDIOGRAM (TEE);  Surgeon: Prescott Gum, Collier Salina, MD;  Location: [facility];  Service: Open Heart Surgery;  Laterality: N/A;   THYROID SURGERY  infant   uncertain details; no thyroid replacement listed   WISDOM TOOTH EXTRACTION     Social History:  reports that he has never smoked. He has never used smokeless tobacco. He reports that he does not currently use alcohol. He reports that he does not use drugs.  Allergies  Allergen Reactions   Other Itching and Other (See Comments)    Pet dander, seasonal allergies = Runny nose, itchy eyes    Family History  Problem Relation Age of Onset   Prostate cancer Father 59       in his 70's/other comorbidities/79 deceased   Alcohol abuse Father    Liver disease Father    Heart attack Father 76   Breast cancer Mother 35       mets to bone,deceased age 5   Cervical cancer Sister    Cervical cancer Sister    Breast cancer Sister    CAD Brother        Died of sudden cardiac death/MI    Prior to Admission medications   Medication Sig Start Date End Date Taking? Authorizing Provider  aspirin EC 81 MG tablet Take 1 tablet (81 mg total) by mouth daily. Swallow whole. Patient taking differently: Take 81 mg by mouth at bedtime. Swallow whole. 01/24/20  Yes [name], Junell C, MD  atorvastatin (LIPITOR) 80 MG tablet TAKE 1 TABLET ONCE DAILY AT 6 PM. Patient taking differently: Take 80 mg by mouth daily at 6 PM. 07/25/20  Yes [name], Mihai, MD  Cholecalciferol (VITAMIN D) 50 MCG (2000 UT) tablet Take 2,000 Units by mouth daily.   Yes [provider]  megestrol (MEGACE ES) 625 MG/5ML suspension Take  5 mLs (625 mg total) by mouth daily. 06/04/21  Yes Wyatt Portela, MD  metoprolol succinate (TOPROL-XL) 50 MG 24 hr tablet TAKE ONE TABLET AT BEDTIME. 06/16/21  Yes [name], Mihai, MD  Milk Thistle 1000 MG CAPS Take 1,000 mg by mouth every other day.   Yes [provider]  mirtazapine (REMERON) 15 MG tablet Take 1 tablet (15 mg total) by mouth at bedtime. 02/03/21  Yes [name], Junell C, MD  nitroGLYCERIN (NITROSTAT) 0.4 MG SL tablet 1 TAB UNDER TONGUE AS NEEDED FOR CHEST PAIN. MAY REPEAT EVERY 5 MIN FOR A TOTAL OF 3 DOSES. 06/21/19  Yes [name], Mihai, MD  oxyCODONE (OXY IR/ROXICODONE) 5 MG immediate release tablet Take 1 tablet (5 mg total) by mouth every 4 (four) hours as needed for breakthrough pain or moderate pain. 02/20/21  Yes Donne Hazel, MD  polyethylene  glycol (MIRALAX / GLYCOLAX) 17 g packet Take 17 g by mouth daily. Patient taking differently: Take 17 g by mouth daily as needed (constipation). 01/26/21  Yes Max Romano, Jimmy Picket, MD  prochlorperazine (COMPAZINE) 10 MG tablet TAKE 1 TABLET EVERY 6 HOURS AS NEEDED FOR NAUSEA & VOMITING. 06/11/21  Yes Wyatt Portela, MD    Physical Exam: Vitals:   06/24/21 1545 06/24/21 1600 06/24/21 1630 06/24/21 1741  BP: 124/87 130/71 115/77   Pulse: 99 (!) 103  (!) 106  Resp: (!) '27 17 15   '$ Temp:    97.7 F (36.5 C)  TempSrc:    Axillary  SpO2: 100% 98% 100%   Weight:      Height:       Neurology awake and alert, very weak and deconditioned  ENT with pallor and icterus Cardiovascular with S1 and S2 present irregularly irregular with no gallops or murmurs, no rubs No JVD Mild pedal edema. Respiratory with scattered rales but no wheezing or rhonchi Abdomen soft and mild distended, non tender  Data Reviewed:   76 yo male with past medical history of urothelial cancer, with liver and bony metastasis, under palliative care as outpatient. Very weak and deconditioned. He had post operative atrial fibrillation in 2020. Presented  with acute onset of palpitations. On his initial physical examination he is hemodynamically stable with blood pressure 115 to 627 mmHg systolic HR 035, RR 15 and 02 saturation 100%, heart with S1 and S2 irregularly irregular, with no gallops or murmurs, lungs with scattered rales and abdomen soft, mild distended, pedal edema mild.  Na 130, K 3,9, CL 103, bicarbonate 23, glucose 103, bun 19 cr 0,87 AST 42, ALT 43 Total bilirubin 5.2  Wbc 6.8, hgb 7,8, hct 23,4. Plt 203  Covid pending   Chest radiograph with no infiltrates, mild enlarged hilar vascular structures bilaterally.   EKG 118 bpm, left axis deviation, normal qtc, atrial flutter with variable block 1:1 and 2:1, with no significant ST segment or T wave changes. Positive LVH.   Mr. Wicke is being admitted to the hospital with the working diagnosis of atrial fibrillation with RVR in the setting of progressive abdominal malignancy.   Assessment and Plan: * Atrial flutter ([place]) Patient has been placed on diltiazem for rate control. Heart rate in the 100's, continue atrial fibrillation rhythm.   Plan to increase metoprolol to 50 mg po bid and continue diltiazem drip. Patient with advanced cancer, now under palliative care. High risk of bleeding, will hold on anticoagulation.  Continue telemetry monitoring.   His echocardiogram from 2021 had a preserved LV systolic function with no significant valvular disease. Plan to repeat echocardiogram.   Hyponatremia Na is 130, patient with no signs of volume overload. Plan to follow electrolytes in am. Keep K at 4 and Mg at 2.   Hyperbilirubinemia Patient with clinical icterus and elevated total bilirubin. Likely worsening metastatic disease.  Acute on chronic liver failure.  Plan to check liver US with dopplers and follow up liver function in am.  He has chronic elevation of AST and ALT.   Hyperlipidemia Continue with statin therapy   Essential hypertension Continue blood pressure  monitoring. Hold on antihypertensive medications.   CAD (coronary artery disease), native coronary artery No chest pain.  Continue blood pressure monitoring.   Malignant tumor of renal pelvis, left (HCC) Renal pelvis tumor in 2021, then developed stage IV high grade urothelial carcinoma with hepatic as well as bone metastasis.   Patient currently under  palliative care. Getting IV fluids once a week as outpatient.   Depression Continue with mirtazapine.   Chronic anemia Cell count with hgb at 7,6 and hct at 23,7 Plan to continue follow up on cell count. Anemia is associated to malignancy.       Advance Care Planning:   Code Status: Full Code    Consults: none   Family Communication:  I spoke with patient's wife  at the bedside, we talked in detail about patient's condition, plan of care and prognosis and all questions were addressed.   Severity of Illness: The appropriate patient status for this patient is INPATIENT. Inpatient status is judged to be reasonable and necessary in order to provide the required intensity of service to ensure the patient's safety. The patient's presenting symptoms, physical exam findings, and initial radiographic and laboratory data in the context of their chronic comorbidities is felt to place them at high risk for further clinical deterioration. Furthermore, it is not anticipated that the patient will be medically stable for discharge from the hospital within 2 midnights of admission.   * I certify that at the point of admission it is my clinical judgment that the patient will require inpatient hospital care spanning beyond 2 midnights from the point of admission due to high intensity of service, high risk for further deterioration and high frequency of surveillance required.*  Author: Tawni Millers, MD 06/24/2021 6:16 PM  For on call review [email or web address].

## 2021-06-24 NOTE — Assessment & Plan Note (Signed)
Continue with statin therapy.  ?

## 2021-06-24 NOTE — Progress Notes (Addendum)
ANTICOAGULATION CONSULT NOTE - Initial Consult ? ?Pharmacy Consult for heparin ?Indication: atrial fibrillation ? ?Allergies  ?Allergen Reactions  ? Other Itching and Other (See Comments)  ?  Pet dander, seasonal allergies = Runny nose, itchy eyes  ? ? ?Patient Measurements: ?Height: 6' (182.9 cm) ?Weight: 68 kg (150 lb) ?IBW/kg (Calculated) : 77.6 ?Heparin Dosing Weight: 68 kg ? ?Vital Signs: ?Temp: 97.8 ?F (36.6 ?C) (03/07 1349) ?Temp Source: Oral (03/07 1349) ?BP: 124/87 (03/07 1545) ?Pulse Rate: 99 (03/07 1545) ? ?Labs: ?Recent Labs  ?  06/24/21 ?1139 06/24/21 ?1420  ?HGB 7.8* 7.6*  ?HCT 23.4* 23.7*  ?PLT 203 179  ?CREATININE 0.93 0.87  ?TROPONINIHS  --  11  ? ? ?Estimated Creatinine Clearance: 68.4 mL/min (by C-G formula based on SCr of 0.87 mg/dL). ? ? ?Medical History: ?Past Medical History:  ?Diagnosis Date  ? Arthritis   ? mild hands  ? Asthma   ? pt denies 4/21   ? Chest pain 07/25/2012  ? Coronary artery disease   ? Dysrhythmia   ? PAC'S, hx of atrial fib after OHS 04/2018  ? ED (erectile dysfunction)   ? H/O hiatal hernia   ? Heart murmur   ? Hypercholesterolemia   ? Hypertension   ? MARGINALLY HIGH - NO MEDS -MONITORS  ? Metastatic cancer to bone ([place]) dx'd 07/2020  ? rt humerus  ? Myocardial infarction [facility])   ? 2017   ? Pneumonia   ? hx of 2018   ? PONV (postoperative nausea and vomiting)   ? Prostate cancer ([place]) 03/27/2011  ? prostate bx=adenocarcinoma,  ? Skin cancer 2008  ? basal cell face /removed  ? Sleep apnea   ? borderline sleep apnea per patient no devices   ? Urothelial cancer ([place]) dx'd 05/2019  ? left renal ca  ? ? ? ? ?Assessment: ?76 year old male presented from cancer center. Patient was noted to be in atrial flutter and sent to the ED. It appears that patient does have history of paroxysmal atrial fibrillation in the past but is no longer taking any anticoagulants PTA. He has been started on diltiazem infusion and pharmacy consulted to manage heparin. ? ?Baseline aPTT, PT-INR  pending. ? ?Today, 06/24/21 ?-Hgb 7.6, baseline appears to be 9-10 ? ?Goal of Therapy:  ?Heparin level 0.3-0.7 units/ml ?Monitor platelets by anticoagulation protocol: Yes ?  ?Plan:  ?-Heparin 3400 unit bolus (bolus on low end given low Hgb) ?-Start heparin infusion at 1000 units/hr ?-Check HL 8 hours after start of infusion ?-Daily CBC while on heparin infusion ?-Monitor Hgb/plt closely ? ?Tawnya Crook, PharmD, BCPS ?Clinical Pharmacist ?06/24/2021 4:21 PM ? ? ? ?

## 2021-06-24 NOTE — Assessment & Plan Note (Addendum)
Cell count had been stable ?Anemia is associated to malignancy.  ?Cont to follow CBC trends ?

## 2021-06-24 NOTE — Assessment & Plan Note (Addendum)
Patient was initially placed on diltiazem for rate control, later transitioned to PO cardizem with metoprolol '50mg'$  bid  Patient with advanced cancer, now under palliative care. There were concerns of increased risk of bleeding thus held on anticoagulation. CHADS of 5  Given concerns of dehydration, was given IVF  His echocardiogram from 2020 had a preserved LV systolic function with no significant valvular disease.  Repeat 2d echo with EF 40-45%, which is lower than previous. Pt also with wall motion noted.   [facility] input. Pt now on metoprolol '100mg'$  with cardizem d/c'd  Likely goal here is rate control without anticoagulation, knowing pt has increased risk for stroke down the road. Overall difficult situation  Cardiology has since signed off

## 2021-06-24 NOTE — Assessment & Plan Note (Addendum)
Continue blood pressure monitoring. ?BP stable currently on metoprolol ?

## 2021-06-24 NOTE — ED Provider Notes (Signed)
[facility] DEPT Provider Note   CSN: [id number] Arrival date & time: 06/24/21  1341     History  Chief Complaint  Patient presents with   Tachycardia    Lawrence Hall is a 76 y.o. male with a history of stage IV urothelial carcinoma with hepatic metastasis, now on palliative/hospice care (no longer on chemo, opted for IV hydration only), anemia of chronic disease, moderate aortic stenosis, HTN, HLD, CAD, failure to thrive, presenting emergency department with palpitations and irregular heartbeat.  The patient ports he had a history of paroxysmal A-fib in the past but it was "treated".  He did not longer takes any anticoagulation.  He says he began to feel some shortness of breath and palpitations while receiving his infusions today for hydration.  He did not complete the infusion.  He was sent to the ER for further evaluation   HPI     Home Medications Prior to Admission medications   Medication Sig Start Date End Date Taking? Authorizing Provider  aspirin EC 81 MG tablet Take 1 tablet (81 mg total) by mouth daily. Swallow whole. Patient taking differently: Take 81 mg by mouth at bedtime. Swallow whole. 01/24/20  Yes [name], Junell C, MD  atorvastatin (LIPITOR) 80 MG tablet TAKE 1 TABLET ONCE DAILY AT 6 PM. Patient taking differently: Take 80 mg by mouth daily at 6 PM. 07/25/20  Yes [name], Mihai, MD  Cholecalciferol (VITAMIN D) 50 MCG (2000 UT) tablet Take 2,000 Units by mouth daily.   Yes [provider]  megestrol (MEGACE ES) 625 MG/5ML suspension Take 5 mLs (625 mg total) by mouth daily. 06/04/21  Yes Wyatt Portela, MD  metoprolol succinate (TOPROL-XL) 50 MG 24 hr tablet TAKE ONE TABLET AT BEDTIME. 06/16/21  Yes [name], Mihai, MD  Milk Thistle 1000 MG CAPS Take 1,000 mg by mouth every other day.   Yes [provider]  mirtazapine (REMERON) 15 MG tablet Take 1 tablet (15 mg total) by mouth at bedtime. 02/03/21  Yes [name],  Junell C, MD  nitroGLYCERIN (NITROSTAT) 0.4 MG SL tablet 1 TAB UNDER TONGUE AS NEEDED FOR CHEST PAIN. MAY REPEAT EVERY 5 MIN FOR A TOTAL OF 3 DOSES. 06/21/19  Yes [name], Mihai, MD  oxyCODONE (OXY IR/ROXICODONE) 5 MG immediate release tablet Take 1 tablet (5 mg total) by mouth every 4 (four) hours as needed for breakthrough pain or moderate pain. 02/20/21  Yes Donne Hazel, MD  polyethylene glycol (MIRALAX / GLYCOLAX) 17 g packet Take 17 g by mouth daily. Patient taking differently: Take 17 g by mouth daily as needed (constipation). 01/26/21  Yes Arrien, Jimmy Picket, MD  prochlorperazine (COMPAZINE) 10 MG tablet TAKE 1 TABLET EVERY 6 HOURS AS NEEDED FOR NAUSEA & VOMITING. 06/11/21  Yes Wyatt Portela, MD      Allergies    Other    Review of Systems   Review of Systems  Physical Exam Updated Vital Signs BP 115/77    Pulse (!) 106    Temp 97.7 F (36.5 C) (Axillary)    Resp 15    Ht 6' (1.829 m)    Wt 66.1 kg    SpO2 100%    BMI 19.76 kg/m  Physical Exam Constitutional:      General: He is not in acute distress. HENT:     Head: Normocephalic and atraumatic.  Eyes:     Conjunctiva/sclera: Conjunctivae normal.     Pupils: Pupils are equal, round, and reactive to light.  Cardiovascular:     Rate and Rhythm: Tachycardia present. Rhythm irregular.  Pulmonary:     Effort: Pulmonary effort is normal. No respiratory distress.  Abdominal:     General: There is no distension.     Tenderness: There is no abdominal tenderness.  Skin:    General: Skin is warm and dry.  Neurological:     General: No focal deficit present.     Mental Status: He is alert. Mental status is at baseline.  Psychiatric:        Mood and Affect: Mood normal.        Behavior: Behavior normal.    ED Results / Procedures / Treatments   Labs (all labs ordered are listed, but only abnormal results are displayed) Labs Reviewed  BASIC METABOLIC PANEL - Abnormal; Notable for the following components:      Result  Value   Sodium 130 (*)    Glucose, Bld 103 (*)    Anion gap 4 (*)    All other components within normal limits  CBC WITH DIFFERENTIAL/PLATELET - Abnormal; Notable for the following components:   RBC 2.56 (*)    Hemoglobin 7.6 (*)    HCT 23.7 (*)    RDW 19.6 (*)    All other components within normal limits  BRAIN NATRIURETIC PEPTIDE - Abnormal; Notable for the following components:   B Natriuretic Peptide 341.4 (*)    All other components within normal limits  MAGNESIUM  CBC  BASIC METABOLIC PANEL  TROPONIN I (HIGH SENSITIVITY)    EKG None  Radiology DG Chest Portable 1 View  Result Date: 06/24/2021 CLINICAL DATA:  Chest pain. EXAM: PORTABLE CHEST 1 VIEW COMPARISON:  February 15, 2021. FINDINGS: The heart size and mediastinal contours are within normal limits. Both lungs are clear. Sternotomy wires are noted. Right internal jugular Port-A-Cath is unchanged in position. The visualized skeletal structures are unremarkable. IMPRESSION: No active disease. Electronically Signed   By: Marijo Conception M.D.   On: 06/24/2021 14:35    Procedures Procedures    Medications Ordered in ED Medications  diltiazem (CARDIZEM) 1 mg/mL load via infusion 10 mg (10 mg Intravenous Bolus from Bag 06/24/21 1449)    And  diltiazem (CARDIZEM) 125 mg in dextrose 5% 125 mL (1 mg/mL) infusion (5 mg/hr Intravenous New Bag/Given 06/24/21 1448)  aspirin EC tablet 81 mg (has no administration in time range)  oxyCODONE (Oxy IR/ROXICODONE) immediate release tablet 5 mg (has no administration in time range)  atorvastatin (LIPITOR) tablet 80 mg (has no administration in time range)  mirtazapine (REMERON) tablet 15 mg (has no administration in time range)  acetaminophen (TYLENOL) tablet 650 mg (has no administration in time range)    Or  acetaminophen (TYLENOL) suppository 650 mg (has no administration in time range)  ondansetron (ZOFRAN) tablet 4 mg (has no administration in time range)    Or  ondansetron (ZOFRAN)  injection 4 mg (has no administration in time range)  polyethylene glycol (MIRALAX / GLYCOLAX) packet 17 g (has no administration in time range)  bisacodyl (DULCOLAX) EC tablet 10 mg (has no administration in time range)  metoprolol tartrate (LOPRESSOR) tablet 50 mg (has no administration in time range)  sodium chloride 0.9 % bolus 1,000 mL (0 mLs Intravenous Stopped 06/24/21 1651)  ondansetron (ZOFRAN) injection 4 mg (4 mg Intravenous Given 06/24/21 1451)  heparin bolus via infusion 3,500 Units (3,500 Units Intravenous Bolus from Bag 06/24/21 1650)    ED Course/ Medical Decision Making/ A&P Clinical  Course as of 06/24/21 1842  Tue Jun 24, 2021  1604 Patient reassessed, now on diltiazem infusion at rate of 5, heart rate is approximately 100 bpm and irregular.  Troponin is 11 and magnesium and potassium are within normal limits.  Hemoglobin is also within relative recent baseline, with chronic anemia.  At this point we will move to admit the patient to the hospital for cardiology evaluation for A-fib, unclear onset.  The patient and his wife at the bedside were updated in agreement. [MT]    Clinical Course User Index [MT] Clarabel Marion, Carola Rhine, MD                           Medical Decision Making Amount and/or Complexity of Data Reviewed Labs: ordered. Radiology: ordered.  Risk Prescription drug management. Decision regarding hospitalization.   This patient presents to the ED with concern for tachycardia, palpitations. This involves an extensive number of treatment options, and is a complaint that carries with it a high risk of complications and morbidity.  The differential diagnosis includes  [name] vs infection vs other  Co-morbidities that complicate the patient evaluation: metastatic malignancy; palliative treatments  Additional history obtained from patient's wife  External records from outside source obtained and reviewed including oncology notes  I ordered and personally interpreted  labs.  The pertinent results include:  K and Mg wnl, trop low, doubt ACS.  BNP mildly elevated - no hypoxia, likely mild CHF related to uncontrolled [name].  I ordered imaging studies including dg chest I independently visualized and interpreted imaging which showed no focal infiltrates I agree with the radiologist interpretation  The patient was maintained on a cardiac monitor.  I personally viewed and interpreted the cardiac monitored which showed an underlying rhythm of: A-fib with RVR, heart rate 120  Per my interpretation the patient's ECG shows A-fib with RVR, heart rate approximately 120  I ordered medication including IV fluids, IV diltiazem for hydration and rate control I have reviewed the patients home medicines and have made adjustments as needed  Test Considered:  - doubt acute PE  After the interventions noted above, I reevaluated the patient and found that they have: improved - HR 90-100 bpm on diltiazem  Dispostion:  After consideration of the diagnostic results and the patients response to treatment, I feel that the patent would benefit from admission.         Final Clinical Impression(s) / ED Diagnoses Final diagnoses:  Atrial fibrillation with RVR [facility])    Rx / DC Orders ED Discharge Orders     None         Letricia Krinsky, Carola Rhine, MD 06/24/21 [phone]

## 2021-06-24 NOTE — Progress Notes (Signed)
CRITICAL VALUE STICKER ? ?CRITICAL VALUE: Total bili 5.2 ? ?RECEIVER (on-site recipient of call):  Dow Adolph, RN ? ?DATE & TIME NOTIFIED: 06/24/21 1240 ? ?MESSENGER (representative from lab):  Hilary Flynt ? ?MD NOTIFIED: Alen Blew ? ?TIME OF NOTIFICATION:  1240 ? ?RESPONSE:   ?

## 2021-06-24 NOTE — Assessment & Plan Note (Addendum)
Renal pelvis tumor in 2021, then developed stage IV high grade urothelial carcinoma with hepatic as well as bone metastasis.   Patient was referred to palliative care as outpatient. Per Pt's wife, pt had declined outpt palliaitve Getting IV fluids once a week as outpatient.  Palliative Care is now following. Very poor prognosis

## 2021-06-24 NOTE — Assessment & Plan Note (Addendum)
Patient with clinical icterus and elevated total bilirubin. Likely worsening metastatic disease.  Acute on chronic liver failure.  Plan to check liver US with dopplers and follow up liver function in am.  He has chronic elevation of AST and ALT.  Ammonia levels normal

## 2021-06-24 NOTE — Progress Notes (Signed)
Nutrition Follow-up: ? ?Patient with metastatic urothelial carcinoma. Chemotherapy discontinued due to progression. He is receiving supportive care.  ? ?Briefly met with patient during infusion for IV hydration. Patient appears juandice, noted elevated bilirubin per labs today. Patient reports his blood pressure keeps going up and down today. Nurse attaching leads during visit. Patient reports his appetite has improved. He is taking a pill twice a day for this. Patient reports he has gained 3 pounds. He is requesting some apple juice to drink.  ? ? ?Medications: Megace, Compazine ? ?Labs: Na 134, Glucose 102, Total bilirubin 5.2 ? ?Anthropometrics: Weight 150 lb today  ? ? ?NUTRITION DIAGNOSIS: Severe malnutrition continues  ? ? ?INTERVENTION:  ?Continue eating high calorie high protein foods ?Continue drinking Ensure Plus/equivalent, 2-3/day as desired  ?  ? ?MONITORING, EVALUATION, GOAL: weight trends, intake, patient desires ? ? ?NEXT VISIT: To be scheduled  ? ? ? ?

## 2021-06-24 NOTE — ED Triage Notes (Signed)
Pt arrived from cancer center complaining of A/flutter ?Pt was a Cancer center to get labs and fluids, staff noted HR ranging from 38-150.  ? ?Pt has liver cancer and had a triple bypass in 2021. He states he is not usually in a flutter by has had episodes of it after MI in 2020 and heart surgery in 2021  ? ?Pt denies chest pain, shortness of breath and dizziness.  ?Pt is complaining of feeling nauseous  ?

## 2021-06-24 NOTE — Assessment & Plan Note (Addendum)
Now more awake since holding remeron ?

## 2021-06-24 NOTE — Assessment & Plan Note (Addendum)
Na is 130 at presentation Pt reports limited PO intake leading up to admit Na currently 130 Will check urine sodium, urine osm, serum osm

## 2021-06-24 NOTE — Assessment & Plan Note (Addendum)
Pt is s/p CABG in 1/20 ?No chest pain.  ?Continue blood pressure monitoring.  ?Echo reviewed, with reduced EF of 40-45% with wall motion abnormality ?[facility] input. Not candidate for invasive work up. Medical management planned ? ?Given worsening heart function not amenable to further invasive work up in setting of malignancy, have consulted Palliative Care for goals of care ?

## 2021-06-25 ENCOUNTER — Inpatient Hospital Stay (HOSPITAL_COMMUNITY): Payer: PPO

## 2021-06-25 ENCOUNTER — Ambulatory Visit: Payer: PPO | Admitting: Cardiovascular Disease

## 2021-06-25 DIAGNOSIS — R0609 Other forms of dyspnea: Secondary | ICD-10-CM

## 2021-06-25 DIAGNOSIS — I951 Orthostatic hypotension: Secondary | ICD-10-CM

## 2021-06-25 DIAGNOSIS — I4891 Unspecified atrial fibrillation: Secondary | ICD-10-CM

## 2021-06-25 DIAGNOSIS — D649 Anemia, unspecified: Secondary | ICD-10-CM | POA: Diagnosis not present

## 2021-06-25 LAB — CBC
HCT: 23.1 % — ABNORMAL LOW (ref 39.0–52.0)
Hemoglobin: 7.5 g/dL — ABNORMAL LOW (ref 13.0–17.0)
MCH: 30.1 pg (ref 26.0–34.0)
MCHC: 32.5 g/dL (ref 30.0–36.0)
MCV: 92.8 fL (ref 80.0–100.0)
Platelets: 189 10*3/uL (ref 150–400)
RBC: 2.49 MIL/uL — ABNORMAL LOW (ref 4.22–5.81)
RDW: 19.2 % — ABNORMAL HIGH (ref 11.5–15.5)
WBC: 7.9 10*3/uL (ref 4.0–10.5)
nRBC: 0 % (ref 0.0–0.2)

## 2021-06-25 LAB — HEPATIC FUNCTION PANEL
ALT: 37 U/L (ref 0–44)
AST: 38 U/L (ref 15–41)
Albumin: 2.1 g/dL — ABNORMAL LOW (ref 3.5–5.0)
Alkaline Phosphatase: 238 U/L — ABNORMAL HIGH (ref 38–126)
Bilirubin, Direct: 2.2 mg/dL — ABNORMAL HIGH (ref 0.0–0.2)
Indirect Bilirubin: 1.5 mg/dL — ABNORMAL HIGH (ref 0.3–0.9)
Total Bilirubin: 3.7 mg/dL — ABNORMAL HIGH (ref 0.3–1.2)
Total Protein: 5.6 g/dL — ABNORMAL LOW (ref 6.5–8.1)

## 2021-06-25 LAB — BASIC METABOLIC PANEL
Anion gap: 7 (ref 5–15)
BUN: 17 mg/dL (ref 8–23)
CO2: 22 mmol/L (ref 22–32)
Calcium: 9.3 mg/dL (ref 8.9–10.3)
Chloride: 104 mmol/L (ref 98–111)
Creatinine, Ser: 0.9 mg/dL (ref 0.61–1.24)
GFR, Estimated: >60 mL/min (ref >60)
Glucose, Bld: 80 mg/dL (ref 70–99)
Potassium: 4 mmol/L (ref 3.5–5.1)
Sodium: 133 mmol/L — ABNORMAL LOW (ref 135–145)

## 2021-06-25 LAB — ECHOCARDIOGRAM COMPLETE
AR max vel: 2.11 cm2
AV Area VTI: 2.31 cm2
AV Area mean vel: 2.15 cm2
AV Mean grad: 4 mmHg
AV Peak grad: 8.2 mmHg
Ao pk vel: 1.43 m/s
Area-P 1/2: 2.76 cm2
Height: 72 in
S' Lateral: 3.3 cm
Weight: 2331.58 oz

## 2021-06-25 MED ORDER — ENSURE ENLIVE PO LIQD
237.0000 mL | Freq: Three times a day (TID) | ORAL | Status: DC
  Administered 2021-06-25 – 2021-07-07 (×20): 237 mL via ORAL

## 2021-06-25 MED ORDER — MEGESTROL ACETATE 400 MG/10ML PO SUSP
400.0000 mg | Freq: Every day | ORAL | Status: DC
  Administered 2021-06-25 – 2021-07-04 (×9): 400 mg via ORAL
  Filled 2021-06-25 (×11): qty 10

## 2021-06-25 MED ORDER — SODIUM CHLORIDE 0.9 % IV SOLN: INTRAVENOUS | Status: AC

## 2021-06-25 MED ORDER — ADULT MULTIVITAMIN W/MINERALS CH
1.0000 | ORAL_TABLET | Freq: Every day | ORAL | Status: DC
  Administered 2021-06-25 – 2021-07-04 (×10): 1 via ORAL
  Filled 2021-06-25 (×11): qty 1

## 2021-06-25 NOTE — Progress Notes (Signed)
Initial Nutrition Assessment ? ?DOCUMENTATION CODES:  ? ?Severe malnutrition in context of chronic illness ? ?INTERVENTION:  ?- continue Ensure Plus High Protein but will increase from BID to TID, each supplement provides 350 kcal and 20 grams of protein. ?- will order 1 tablet multivitamin with minerals daily. ? ? ?NUTRITION DIAGNOSIS:  ? ?Severe Malnutrition related to chronic illness, cancer and cancer related treatments as evidenced by severe fat depletion, severe muscle depletion. ? ?GOAL:  ? ?Patient will meet greater than or equal to 90% of their needs ? ?MONITOR:  ? ?PO intake, Supplement acceptance, Labs, Weight trends ? ?REASON FOR ASSESSMENT:  ? ?Malnutrition Screening Tool, Consult ?Assessment of nutrition requirement/status ? ?ASSESSMENT:  ? ?76 y.o. male with medical history of renal pelvis tumor in 2021, stage IV high grade urothelial carcinoma with hepatic metastasis, under palliative care, HTN, dyslipidemia, afib, [name], and bioprosthetic aortic valve replacement. He has been very weak, severely deconditioned, poor mobility, poor balance, and FTT. He was at the [facility] for infusion and began to have palpitations associated with dyspnea. He was admitted with aflutter. ? ?Patient laying in bed with no visitors present at the time of RD visit. Patient kept eyes closed throughout visit and responses were brief and spoken in a very hushed tone. Patient was about to have ECHO so visit brief d/t this as well. ? ?Patient is being followed by RD at the [facility] and last seen by her yesterday (3/7). Patient was taking Megace BID and was encouraged to consume Ensure or similar BID to TID. ? ?Patient reports he ate some lunch but was unable to provide further detail. He recalls visit with RD yesterday. He confirms that megace has been helpful concerning improved appetite. He recalls discussion about oral nutrition supplements and is agreeable.  ? ?Weight yesterday was documented as 146 lb and  weight has been fairly stable since 04/09/21 with some fluctuations during that time.  ? ? ?Labs reviewed; Na: 133 mmol/l, Alk Phos elevated. ?Medications reviewed; 17 g miralax BID, 400 mg megace/day. ?IVF; NS @ 100 ml/hr.  ?  ? ?NUTRITION - FOCUSED PHYSICAL EXAM: ? ?Flowsheet Row Most Recent Value  ?Orbital Region Severe depletion  ?Upper Arm Region Severe depletion  ?Thoracic and Lumbar Region Unable to assess  ?Buccal Region Severe depletion  ?Temple Region Severe depletion  ?Clavicle Bone Region Severe depletion  ?Clavicle and Acromion Bone Region Severe depletion  ?Scapular Bone Region Unable to assess  ?Dorsal Hand Moderate depletion  ?Patellar Region Moderate depletion  ?Anterior Thigh Region Moderate depletion  ?Posterior Calf Region Moderate depletion  ?Edema (RD Assessment) None  ?Hair Reviewed  ?Eyes Unable to assess  [patient kept eyes closed during discussion]  ?Mouth Reviewed  ?Skin Reviewed  ?Nails Reviewed  ? ?  ? ? ?Diet Order:   ?Diet Order   ? ?       ?  Diet regular Room service appropriate? Yes; Fluid consistency: Thin  Diet effective now       ?  ? ?  ?  ? ?  ? ? ?EDUCATION NEEDS:  ? ?Not appropriate for education at this time ? ?Skin:  Skin Assessment: Reviewed RN Assessment ? ?Last BM:  PTA/unknown ? ?Height:  ? ?Ht Readings from Last 1 Encounters:  ?06/24/21 6' (1.829 m)  ? ? ?Weight:  ? ?Wt Readings from Last 1 Encounters:  ?06/24/21 66.1 kg  ? ? ? ?BMI:  Body mass index is 19.76 kg/m?. ? ?Estimated Nutritional Needs:  ?  Kcal:  2100-2300 kcal ?Protein:  105-120 grams ?Fluid:  >/= 2.3 L/day ? ? ? ? ?Jarome Matin, MS, RD, LDN ?Inpatient Clinical Dietitian ?RD pager # available in [place]  ?After hours/weekend pager # available in [place] ? ?

## 2021-06-25 NOTE — Progress Notes (Signed)
Echocardiogram ?2D Echocardiogram has been performed. ? ?Arlyss Gandy ?06/25/2021, 2:09 PM ?

## 2021-06-25 NOTE — Evaluation (Signed)
Physical Therapy Evaluation Patient Details Name: Lawrence Hall MRN: [id number] DOB: 03/02/1946 Today's Date: 06/25/2021  History of Present Illness  Lawrence Hall is a 76 y.o. 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 Hypertension, dyslipidemia, paroxysmal atrial fibrillation, coronary artery disease sp CABG, and bioprosthetic aortic valve replacement. Patient has been very weak and deconditioned, poor mobility due to generalized weakness, poor balance and failure to thrive.  At the infusion center he was noted to have a rapid heart rate with irregular rhythm and he was transferred to the ED for further evaluation.  Clinical Impression  Pt presents with decreased mobility, strength, functional endurance, and movement tolerance. These impairments are limiting his ability to safely and independently transfer, get into his home, perform all adls/iadls, and ambulate in the community. Pt to benefit from acute PT to address deficits. Bed mobility and function transfers were performed. Pt. Had a near syncopal episode while standing and had reduced responsiveness, he was immediately laid down into trendelenburg position until responsiveness improved and blood pressure elevated (supine BP 105/85, after 2 min trendelenburg 128/78).  Pt. Responded well but was limited secondary to fatigue and vital stability. SPT recommends home health follow up once medically stable for d/c, secondary to increased need for support and family wishes. PT to progress mobility as tolerated, and will continue to follow acutely.         Recommendations for follow up therapy are one component of a multi-disciplinary discharge planning process, led by the attending physician.  Recommendations may be updated based on patient status, additional functional criteria and insurance authorization.  Follow Up  Recommendations Home health PT    Assistance Recommended at Discharge Intermittent Supervision/Assistance  Patient can return home with the following  A lot of help with walking and/or transfers;A little help with bathing/dressing/bathroom;Assistance with cooking/housework;Direct supervision/assist for financial management;Assist for transportation    Equipment Recommendations Rolling walker (2 wheels);BSC/3in1;Other (comment) Catering manager chair)  Recommendations for Other Services  Speech consult    Functional Status Assessment Patient has had a recent decline in their functional status and demonstrates the ability to make significant improvements in function in a reasonable and predictable amount of time.     Precautions / Restrictions Precautions Precautions: Fall Precaution Comments: orthostatic Restrictions Weight Bearing Restrictions: No      Mobility  Bed Mobility Overal bed mobility: Needs Assistance Bed Mobility: Sidelying to Sit, Supine to Sit, Sit to Supine   Sidelying to sit: Min assist Supine to sit: Mod assist, Min assist Sit to supine: Mod assist, +2 for safety/equipment   General bed mobility comments: Increased time, cuing for sequecning, and LE management. Supine to sit Mod A 2 for saftey to return    Transfers Overall transfer level: Needs assistance Equipment used: Rolling walker (2 wheels) Transfers: Sit to/from Stand Sit to Stand: Min assist           General transfer comment: Min A for power up and pt. able to self steady. Pt had near syncopal episode when standing and complains of dizziness, he becomes less responsive. Immediately laid down and positioned to trendelenburg. BP 105/84 and then returns to 128/78.    Ambulation/Gait                  Stairs            Wheelchair Mobility    Modified Rankin (Stroke Patients Only)  Balance Overall balance assessment: Needs assistance, History of Falls Sitting-balance  support: No upper extremity supported, Feet supported Sitting balance-Leahy Scale: Good Sitting balance - Comments: Able to sit with hands in lap and reach minimally out of BOS during OT evaluation   Standing balance support: During functional activity Standing balance-Leahy Scale: Poor                               Pertinent Vitals/Pain Pain Assessment Pain Assessment: No/denies pain    Home Living Family/patient expects to be discharged to:: Private residence Living Arrangements: Spouse/significant other Available Help at Discharge: Family;Available 24 hours/day Type of Home: House Home Access: Stairs to enter Entrance Stairs-Rails: Can reach both Entrance Stairs-Number of Steps: 10 front entrance   Home Layout: One level Home Equipment: None;BSC/3in1 Additional Comments: checking on status of BSC    Prior Function Prior Level of Function : Needs assist       Physical Assist : Mobility (physical);ADLs (physical) Mobility (physical): Transfers;Stairs;Gait ADLs (physical): Toileting Mobility Comments: Wife able to help at all times ADLs Comments: Wife helps PRN     Hand Dominance   Dominant Hand: Right    Extremity/Trunk Assessment   Upper Extremity Assessment Upper Extremity Assessment: Defer to OT evaluation RUE Deficits / Details: Decreased shoulder ROM from prior humeral fracture, 3-/5 shoulder, 4/5 elbow strength, 5/5, wrist, 4-/5 grip RUE Sensation: WNL RUE Coordination: WNL LUE Deficits / Details: 4+/5 shoulder strength, 5/5 elbow, 4+/5 wrist, 4-5 grip LUE Sensation: WNL LUE Coordination: WNL    Lower Extremity Assessment Lower Extremity Assessment: Generalized weakness    Cervical / Trunk Assessment Cervical / Trunk Assessment: Normal  Communication   Communication: No difficulties;Expressive difficulties  Cognition Arousal/Alertness: Lethargic Behavior During Therapy: WFL for tasks assessed/performed Overall Cognitive Status:  Impaired/Different from baseline Area of Impairment: Attention, Following commands, Safety/judgement                   Current Attention Level: Sustained   Following Commands: Follows one step commands consistently, Follows multi-step commands inconsistently Safety/Judgement: Decreased awareness of safety, Decreased awareness of deficits              General Comments      Exercises General Exercises - Lower Extremity Quad Sets: AROM, Both, 5 reps Heel Slides: AAROM, Both, 5 reps   Assessment/Plan    PT Assessment Patient needs continued PT services  PT Problem List Decreased strength;Decreased mobility;Impaired tone;Decreased range of motion;Decreased safety awareness;Decreased coordination;Decreased knowledge of precautions;Decreased activity tolerance;Decreased cognition;Cardiopulmonary status limiting activity;Decreased balance;Decreased knowledge of use of DME       PT Treatment Interventions DME instruction;Therapeutic exercise;Gait training;Balance training;Neuromuscular re-education;Stair training;Functional mobility training;Cognitive remediation;Therapeutic activities;Patient/family education    PT Goals (Current goals can be found in the Care Plan section)  Acute Rehab PT Goals Patient Stated Goal: Improve positional tolerance and vital stability. PT Goal Formulation: With patient/family Time For Goal Achievement: 07/09/21 Potential to Achieve Goals: Good    Frequency Min 3X/week     Co-evaluation PT/OT/SLP Co-Evaluation/Treatment: Yes Reason for Co-Treatment: Complexity of the patient's impairments (multi-system involvement);To address functional/ADL transfers PT goals addressed during session: Mobility/safety with mobility;Balance;Proper use of DME OT goals addressed during session: Proper use of Adaptive equipment and DME;ADL's and self-care       AM-PAC PT "6 Clicks" Mobility  Outcome Measure Help needed turning from your back to your side  while in a flat bed without using bedrails?: A  Little Help needed moving from lying on your back to sitting on the side of a flat bed without using bedrails?: A Little Help needed moving to and from a bed to a chair (including a wheelchair)?: A Lot Help needed standing up from a chair using your arms (e.g., wheelchair or bedside chair)?: A Lot Help needed to walk in hospital room?: A Lot Help needed climbing 3-5 steps with a railing? : Total 6 Click Score: 13    End of Session Equipment Utilized During Treatment: Gait belt Activity Tolerance: Treatment limited secondary to medical complications (Comment) Patient left: in bed;with call bell/phone within reach;with bed alarm set;with family/visitor present Nurse Communication: Mobility status PT Visit Diagnosis: Difficulty in walking, not elsewhere classified (R26.2);Muscle weakness (generalized) (M62.81);Other abnormalities of gait and mobility (R26.89)    Time: 4696-2952 PT Time Calculation (min) (ACUTE ONLY): 32 min   Charges:   PT Evaluation $PT Eval Low Complexity: 1 Low          Thermon Leyland, SPT Acute Rehab Services   Thermon Leyland 06/25/2021, 11:18 AM

## 2021-06-25 NOTE — Progress Notes (Signed)
RN asked patient about applying ordered SCD sleeves; patient refused to allow RN to place them on him. ?

## 2021-06-25 NOTE — Evaluation (Signed)
Occupational Therapy Evaluation ?Patient Details ?Name: Lawrence Hall ?MRN: [id number] ?DOB: 1946-02-05 ?Today's Date: 06/25/2021 ? ? ?History of Present Illness Lawrence Hall is a 76 y.o. 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 Hypertension, dyslipidemia, paroxysmal atrial fibrillation, coronary artery disease sp CABG, and bioprosthetic aortic valve replacement. Patient has been very weak and deconditioned, poor mobility due to generalized weakness, poor balance and failure to thrive.  At the infusion center he was noted to have a rapid heart rate with irregular rhythm and he was transferred to the ED for further evaluation.  ? ?Clinical Impression ?  ?Lawrence Hall admitted to hospital with above medical history and presents with generalized weakness, decreased activity tolerance, and impaired balance. Patient needing mod assist to transfer and stand with walker and increased assistance with ADLs including mod-max assist for LB ADLs and toileting. Patient orthostatic with standing and HR up to 150s and had to be returned to supine to recover. Of note, he reports new difficulty speaking in the last week. Patient will benefit from skilled OT services while in hospital to improve deficits and learn compensatory strategies as needed in order to return to PLOF.  Discussed equipment needs and POC with patient and spouse. Patient's spouse initially wanting OP therapy but patient currently does not exhibit the ability to tolerate that much activate. Will update POC if patient progresses while in hospital. Currently recommend [facility] services.  ?  ?   ? ?Recommendations for follow up therapy are one component of a multi-disciplinary discharge planning process, led by the attending physician.  Recommendations may be updated based on patient status, additional functional criteria and insurance  authorization.  ? ?Follow Up Recommendations ? Home health OT  ?  ?Assistance Recommended at Discharge Frequent or constant Supervision/Assistance  ?Patient can return home with the following A lot of help with walking and/or transfers;A lot of help with bathing/dressing/bathroom ? ?  ?Functional Status Assessment ?    ?Equipment Recommendations ? Tub/shower seat  ?  ?Recommendations for Other Services   ? ? ?  ?Precautions / Restrictions Precautions ?Precautions: Fall ?Precaution Comments: orthostatic ?Restrictions ?Weight Bearing Restrictions: No  ? ?  ? ?Mobility Bed Mobility ?  ?  ?  ?  ?  ?  ?  ?  ?  ? ?Transfers ?  ?  ?  ?  ?  ?  ?  ?  ?  ?  ?  ? ?  ?Balance Overall balance assessment: Needs assistance ?Sitting-balance support: No upper extremity supported, Feet supported ?Sitting balance-Leahy Scale: Good ?  ?  ?Standing balance support: During functional activity ?Standing balance-Leahy Scale: Poor ?  ?  ?  ?  ?  ?  ?  ?  ?  ?  ?  ?  ?   ? ?ADL either performed or assessed with clinical judgement  ? ?ADL Overall ADL's : Needs assistance/impaired ?Eating/Feeding: Set up;Sitting ?  ?Grooming: Set up;Sitting ?  ?Upper Body Bathing: Sitting;Minimal assistance ?  ?Lower Body Bathing: Maximal assistance;Sitting/lateral leans ?  ?Upper Body Dressing : Moderate assistance;Sitting ?  ?Lower Body Dressing: Moderate assistance;Sit to/from stand ?Lower Body Dressing Details (indicate cue type and reason): able to don socks at side of bed using figure four method - physical assistance required to stand ?Toilet Transfer: Moderate assistance;BSC/3in1 ?  ?Toileting- Clothing Manipulation and Hygiene: Maximal assistance;Sit to/from stand ?  ?  ?  ?  ?  General ADL Comments: Patient mod assist to transfer to side of bed. WIth standing patient became orthostatic and symptomatic - nearing losing consciosuness and not responding. Returned to supine and trendelburg position and BP found to be 105/84 in that position. HR up to 150s.   ? ? ? ?Vision   ?   ?   ?Perception   ?  ?Praxis   ?  ? ?Pertinent Vitals/Pain Pain Assessment ?Pain Assessment: No/denies pain  ? ? ? ?Hand Dominance Right ?  ?Extremity/Trunk Assessment Upper Extremity Assessment ?Upper Extremity Assessment: RUE deficits/detail;LUE deficits/detail ?RUE Deficits / Details: Decreased shoulder ROM from prior humeral fracture, 3-/5 shoulder, 4/5 elbow strength, 5/5, wrist, 4-/5 grip ?RUE Sensation: WNL ?RUE Coordination: WNL ?LUE Deficits / Details: 4+/5 shoulder strength, 5/5 elbow, 4+/5 wrist, 4-5 grip ?LUE Sensation: WNL ?LUE Coordination: WNL ?  ?Lower Extremity Assessment ?Lower Extremity Assessment: Defer to PT evaluation ?  ?Cervical / Trunk Assessment ?Cervical / Trunk Assessment: Normal ?  ?Communication Communication ?Communication: No difficulties;Expressive difficulties ?  ?Cognition Arousal/Alertness: Awake/alert ?Behavior During Therapy: [facility] for tasks assessed/performed ?Overall Cognitive Status: Within Functional Limits for tasks assessed ?  ?  ?  ?  ?  ?  ?  ?  ?  ?  ?  ?  ?  ?  ?  ?  ?  ?  ?  ?General Comments    ? ?  ?Exercises   ?  ?Shoulder Instructions    ? ? ?Home Living Family/patient expects to be discharged to:: Private residence ?Living Arrangements: Spouse/significant other ?Available Help at Discharge: Family;Available 24 hours/day ?Type of Home: House ?Home Access: Stairs to enter ?Entrance Stairs-Number of Steps: 10 front entrance ?Entrance Stairs-Rails: Can reach both ?Home Layout: One level ?  ?  ?Bathroom Shower/Tub: Sponge bathes at baseline (washes with wash clothes) ?  ?Bathroom Toilet: Standard ?  ?  ?Home Equipment: None;BSC/3in1 ?  ?Additional Comments: checking on status of BSC ?  ? ?  ?Prior Functioning/Environment Prior Level of Function : Needs assist ?  ?  ?  ?Physical Assist : Mobility (physical);ADLs (physical) ?Mobility (physical): Transfers;Stairs;Gait ?ADLs (physical): Toileting ?Mobility Comments: Wife able to help at all times ?ADLs  Comments: Wife helps PRN ?  ? ?  ?  ?OT Problem List: Decreased strength;Decreased range of motion;Decreased activity tolerance;Impaired balance (sitting and/or standing);Decreased knowledge of use of DME or AE;Cardiopulmonary status limiting activity ?  ?   ?OT Treatment/Interventions: Self-care/ADL training;Therapeutic exercise;DME and/or AE instruction;Therapeutic activities;Balance training;Patient/family education  ?  ?OT Goals(Current goals can be found in the care plan section) Acute Rehab OT Goals ?Patient Stated Goal: get stronger to go to OP rehab ?OT Goal Formulation: With patient/family ?Time For Goal Achievement: 07/09/21 ?Potential to Achieve Goals: Fair  ?OT Frequency: Min 2X/week ?  ? ?Co-evaluation   ?  ?  ?  ?  ? ?  ?AM-PAC OT "6 Clicks" Daily Activity     ?Outcome Measure Help from another person eating meals?: A Little ?Help from another person taking care of personal grooming?: A Little ?Help from another person toileting, which includes using toliet, bedpan, or urinal?: A Lot ?Help from another person bathing (including washing, rinsing, drying)?: A Lot ?Help from another person to put on and taking off regular upper body clothing?: A Lot ?Help from another person to put on and taking off regular lower body clothing?: A Lot ?6 Click Score: 14 ?  ?End of Session Equipment Utilized During Treatment: Rolling walker (2 wheels);Gait belt ?Nurse  Communication: Mobility status (BP, reports new speech difficulties this last week) ? ?Activity Tolerance: Patient limited by fatigue (orthostatic) ?Patient left:   ? ?OT Visit Diagnosis: Muscle weakness (generalized) (M62.81);Adult, failure to thrive (R62.7)  ?              ?Time: 5397-6734 ?OT Time Calculation (min): 32 min ?Charges:  OT General Charges ?$OT Visit: 1 Visit ?OT Evaluation ?$OT Eval Low Complexity: 1 Low ? ?Tyquon Near, OTR/L ?Acute Care Rehab Services  ?Office [phone] ?Pager: [phone]  ? ?Stephone Gum L Marko Skalski ?06/25/2021, 9:58 AM ?

## 2021-06-25 NOTE — Assessment & Plan Note (Addendum)
Recently noted to have BP rapidly dropping on standing ?Pt clinically dehydrated with limited PO intake ?Improved with IVF hydration ?

## 2021-06-25 NOTE — Progress Notes (Signed)
?  Progress Note ? ? ?Patient: Lawrence Hall XFG:[id number] DOB: Dec 22, 1945 DOA: 06/24/2021     1 ?DOS: the patient was seen and examined on 06/25/2021 ?  ?Brief hospital course: ?No notes on file ? ?Assessment and Plan: ?* Atrial flutter ([place]) ?Patient has been placed on diltiazem for rate control. ?Heart rate in the 100's, continue atrial fibrillation rhythm.  ? ?Now on metoprolol to 50 mg po bid and continue diltiazem drip. ?Patient with advanced cancer, now under palliative care. ?High risk of bleeding, will hold on anticoagulation.  ? ?Given concerns of dehydration, have continued on NS at 100cc/hr ? ?His echocardiogram from 2021 had a preserved LV systolic function with no significant valvular disease. ?Plan to repeat echocardiogram.  ? ?Hyponatremia ?Na is 130 at presentation ?Clinically dehydrated on exam with dry mucus membranes and poor skin turgor ?Pt reports limited PO intake leading up to admit ?Have continued pt on NS IVF ?Recheck bmet in AM ? ?Hyperbilirubinemia ?Patient with clinical icterus and elevated total bilirubin. ?Likely worsening metastatic disease. ? ?Acute on chronic liver failure.  ?Plan to check liver US with dopplers and follow up liver function in am.  ?He has chronic elevation of AST and ALT.  ? ?Hyperlipidemia ?Continue with statin therapy  ? ?Essential hypertension ?Continue blood pressure monitoring. ?Hold on antihypertensive medications.  ? ?CAD (coronary artery disease), native coronary artery ?No chest pain.  ?Continue blood pressure monitoring.  ? ?Malignant tumor of renal pelvis, left (HCC) ?Renal pelvis tumor in 2021, then developed stage IV high grade urothelial carcinoma with hepatic as well as bone metastasis.  ? ?Patient currently under palliative care. ?Getting IV fluids once a week as outpatient.  ?Cr stable at 0.9 ? ?Depression ?Continue with mirtazapine.  ? ?Chronic anemia ?Cell count with hgb stable at 7.5 ?Plan to continue follow up on cell count. ?Anemia is associated to  malignancy.  ? ?Orthostatic hypotension ?Reported by therapy with BP rapidly dropping on standing ?Pt clinically dehydrated with limited PO intake ?Will continue IVF hydration as tolerated ? ? ? ? ?  ? ?Subjective: Feeling weak this AM ? ?Physical Exam: ?Vitals:  ? 06/24/21 2123 06/25/21 0125 06/25/21 0434 06/25/21 1225  ?BP: 115/70 100/81 (!) 114/59 (!) 107/53  ?Pulse: (!) 104 77 (!) 50 75  ?Resp: '16 18 18 14  '$ ?Temp: (!) 97.3 ?F (36.3 ?C) 98.4 ?F (36.9 ?C) 98 ?F (36.7 ?C)   ?TempSrc: Oral Oral Oral   ?SpO2: 100% 100% 100% 100%  ?Weight:      ?Height:      ? ?General exam: Awake, laying in bed, in nad, dry mucus membranes ?Respiratory system: Normal respiratory effort, no wheezing ?Cardiovascular system: regular rate, s1, s2 ?Gastrointestinal system: Soft, nondistended, positive BS ?Central nervous system: CN2-12 grossly intact, strength intact ?Extremities: Perfused, no clubbing ?Skin: Decreased skin turgor, no notable skin lesions seen ?Psychiatry: Mood normal // no visual hallucinations  ? ?Data Reviewed: ? ?Hgb 7.5 ? ?Family Communication: Pt in room, wife at bedside ? ?Disposition: ?Status is: Inpatient ?Remains inpatient appropriate because: Severity of illness ? Planned Discharge Destination: Home with Home Health ? ? ? ?Author: ?Marylu Lund, MD ?06/25/2021 1:34 PM ? ?For on call review [email or web address].  ?

## 2021-06-26 ENCOUNTER — Encounter: Payer: Self-pay | Admitting: General Practice

## 2021-06-26 DIAGNOSIS — I4891 Unspecified atrial fibrillation: Secondary | ICD-10-CM | POA: Diagnosis not present

## 2021-06-26 DIAGNOSIS — D649 Anemia, unspecified: Secondary | ICD-10-CM | POA: Diagnosis not present

## 2021-06-26 LAB — COMPREHENSIVE METABOLIC PANEL
ALT: 30 U/L (ref 0–44)
AST: 31 U/L (ref 15–41)
Albumin: 1.9 g/dL — ABNORMAL LOW (ref 3.5–5.0)
Alkaline Phosphatase: 223 U/L — ABNORMAL HIGH (ref 38–126)
Anion gap: 3 — ABNORMAL LOW (ref 5–15)
BUN: 17 mg/dL (ref 8–23)
CO2: 22 mmol/L (ref 22–32)
Calcium: 8.6 mg/dL — ABNORMAL LOW (ref 8.9–10.3)
Chloride: 106 mmol/L (ref 98–111)
Creatinine, Ser: 0.94 mg/dL (ref 0.61–1.24)
GFR, Estimated: >60 mL/min (ref >60)
Glucose, Bld: 92 mg/dL (ref 70–99)
Potassium: 3.7 mmol/L (ref 3.5–5.1)
Sodium: 131 mmol/L — ABNORMAL LOW (ref 135–145)
Total Bilirubin: 2.9 mg/dL — ABNORMAL HIGH (ref 0.3–1.2)
Total Protein: 4.9 g/dL — ABNORMAL LOW (ref 6.5–8.1)

## 2021-06-26 LAB — AMMONIA: Ammonia: 35 umol/L (ref 9–35)

## 2021-06-26 MED ORDER — ZOLPIDEM TARTRATE 5 MG PO TABS
5.0000 mg | ORAL_TABLET | Freq: Every evening | ORAL | Status: DC | PRN
  Administered 2021-06-27: 5 mg via ORAL
  Filled 2021-06-26: qty 1

## 2021-06-26 MED ORDER — SODIUM CHLORIDE 0.9 % IV BOLUS
250.0000 mL | Freq: Once | INTRAVENOUS | Status: AC
  Administered 2021-06-26: 12:00:00 250 mL via INTRAVENOUS

## 2021-06-26 MED ORDER — DILTIAZEM HCL 60 MG PO TABS
60.0000 mg | ORAL_TABLET | Freq: Three times a day (TID) | ORAL | Status: DC
  Administered 2021-06-26 (×2): 60 mg via ORAL
  Filled 2021-06-26 (×3): qty 1

## 2021-06-26 NOTE — Progress Notes (Signed)
[place] [facility]ual [facility] Note ? ?Attempted inpatient visit because I follow Mr Umholtz for pastoral support outpatient at [facility], but he was sleeping. Left handmade healing blanket with my brochure as a tangible sign of support and encouragement. Will continue to follow from [facility] side. ? ? ?Chaplain Lorrin Jackson, MDiv, [facility] ?Pager [phone] ?Voicemail [phone]  ?

## 2021-06-26 NOTE — Progress Notes (Signed)
?  Progress Note ? ? ?Patient: Lawrence Hall RWE:[id number] DOB: [birth year] DOA: 06/24/2021     2 ?DOS: the patient was seen and examined on 06/26/2021 ?  ?Brief hospital course: ?No notes on file ? ?Assessment and Plan: ?* Atrial flutter ([place]) ?Patient was initially placed on diltiazem for rate control. ?Heart rate now improved. Transition to PO cardizem  ? ?Now on metoprolol to 50 mg po bid and continue diltiazem drip. ?Patient with advanced cancer, now under palliative care. ?High risk of bleeding, will hold on anticoagulation.  ? ?Given concerns of dehydration, have continued on NS at 100cc/hr ? ?His echocardiogram from 2020 had a preserved LV systolic function with no significant valvular disease. ? ?Repeat 2d echo with EF 40-45%  ? ?Hyponatremia ?Na is 130 at presentation ?Clinically dehydrated on exam with dry mucus membranes and poor skin turgor ?Pt reports limited PO intake leading up to admit ?Have continued pt on NS IVF ?Recheck bmet in AM ? ?Hyperbilirubinemia ?Patient with clinical icterus and elevated total bilirubin. ?Likely worsening metastatic disease. ? ?Acute on chronic liver failure.  ?Plan to check liver US with dopplers and follow up liver function in am.  ?He has chronic elevation of AST and ALT.  ?Ammonia levels normal ? ?Hyperlipidemia ?Continue with statin therapy  ? ?Essential hypertension ?Continue blood pressure monitoring. ?Hold on antihypertensive medications.  ? ?CAD (coronary artery disease), native coronary artery ?Pt is s/p CABG in 1/20 ?No chest pain.  ?Continue blood pressure monitoring.  ? ?Malignant tumor of renal pelvis, left (HCC) ?Renal pelvis tumor in 2021, then developed stage IV high grade urothelial carcinoma with hepatic as well as bone metastasis.  ? ?Patient currently under palliative care. ?Getting IV fluids once a week as outpatient.  ?Cr stable at 0.94 ? ?Depression ?Continue with mirtazapine.  ? ?Chronic anemia ?Cell count with hgb stable at 7.5 ?Plan to continue follow  up on cell count. ?Anemia is associated to malignancy.  ? ?Orthostatic hypotension ?Recently noted to have BP rapidly dropping on standing ?Pt clinically dehydrated with limited PO intake ?Somewhat improved with IVF hydration ?BP remains soft this AM, will give IVF bolus ? ? ? ? ?  ? ?Subjective: Tired this AM. Reports he did not sleep well overnight ? ?Physical Exam: ?Vitals:  ? 06/25/21 2000 06/25/21 2120 06/26/21 0408 06/26/21 1202  ?BP: 104/60 (!) 114/59 113/63 137/89  ?Pulse: 77 (!) 101 98 (!) 102  ?Resp: 15  17 (!) 22  ?Temp: 98 ?F (36.7 ?C)  97.7 ?F (36.5 ?C) 97.6 ?F (36.4 ?C)  ?TempSrc: Oral  Oral Oral  ?SpO2: 99%  98%   ?Weight:      ?Height:      ? ?General exam: Conversant, in no acute distress ?Respiratory system: normal chest rise, clear, no audible wheezing ?Cardiovascular system: regular rhythm, s1-s2 ?Gastrointestinal system: Nondistended, nontender, pos BS ?Central nervous system: No seizures, no tremors ?Extremities: No cyanosis, no joint deformities ?Skin: No rashes, no pallor ?Psychiatry: Affect normal // no auditory hallucinations  ? ?Data Reviewed: ? ?Hgb 7.5 ? ?Family Communication: Pt in room, wife at bedside ? ?Disposition: ?Status is: Inpatient ?Remains inpatient appropriate because: Severity of illness ? Planned Discharge Destination: Home with Home Health ? ? ? ?Author: ?Marylu Lund, MD ?06/26/2021 3:24 PM ? ?For on call review [email or web address]  ?

## 2021-06-27 DIAGNOSIS — I4891 Unspecified atrial fibrillation: Secondary | ICD-10-CM | POA: Diagnosis not present

## 2021-06-27 DIAGNOSIS — D649 Anemia, unspecified: Secondary | ICD-10-CM | POA: Diagnosis not present

## 2021-06-27 LAB — COMPREHENSIVE METABOLIC PANEL
ALT: 28 U/L (ref 0–44)
AST: 37 U/L (ref 15–41)
Albumin: 2 g/dL — ABNORMAL LOW (ref 3.5–5.0)
Alkaline Phosphatase: 259 U/L — ABNORMAL HIGH (ref 38–126)
Anion gap: 5 (ref 5–15)
BUN: 16 mg/dL (ref 8–23)
CO2: 20 mmol/L — ABNORMAL LOW (ref 22–32)
Calcium: 8.8 mg/dL — ABNORMAL LOW (ref 8.9–10.3)
Chloride: 106 mmol/L (ref 98–111)
Creatinine, Ser: 0.94 mg/dL (ref 0.61–1.24)
GFR, Estimated: >60 mL/min (ref >60)
Glucose, Bld: 114 mg/dL — ABNORMAL HIGH (ref 70–99)
Potassium: 3.8 mmol/L (ref 3.5–5.1)
Sodium: 131 mmol/L — ABNORMAL LOW (ref 135–145)
Total Bilirubin: 2.3 mg/dL — ABNORMAL HIGH (ref 0.3–1.2)
Total Protein: 5.3 g/dL — ABNORMAL LOW (ref 6.5–8.1)

## 2021-06-27 LAB — CBC
HCT: 22.7 % — ABNORMAL LOW (ref 39.0–52.0)
Hemoglobin: 7.2 g/dL — ABNORMAL LOW (ref 13.0–17.0)
MCH: 30.3 pg (ref 26.0–34.0)
MCHC: 31.7 g/dL (ref 30.0–36.0)
MCV: 95.4 fL (ref 80.0–100.0)
Platelets: 180 10*3/uL (ref 150–400)
RBC: 2.38 MIL/uL — ABNORMAL LOW (ref 4.22–5.81)
RDW: 19.1 % — ABNORMAL HIGH (ref 11.5–15.5)
WBC: 8.6 10*3/uL (ref 4.0–10.5)
nRBC: 0 % (ref 0.0–0.2)

## 2021-06-27 MED ORDER — DILTIAZEM HCL 30 MG PO TABS
30.0000 mg | ORAL_TABLET | Freq: Three times a day (TID) | ORAL | Status: DC
Start: 2021-06-27 — End: 2021-06-28
  Administered 2021-06-27 – 2021-06-28 (×3): 30 mg via ORAL
  Filled 2021-06-27 (×3): qty 1

## 2021-06-27 NOTE — Progress Notes (Addendum)
Occupational Therapy Treatment ?Patient Details ?Name: Lawrence Hall ?MRN: [id number] ?DOB: 1945/06/22 ?Today's Date: 06/27/2021 ? ? ?History of present illness RAINIER FEUERBORN is a 76 y.o. 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 Hypertension, dyslipidemia, paroxysmal atrial fibrillation, coronary artery disease sp CABG, and bioprosthetic aortic valve replacement. Patient has been very weak and deconditioned, poor mobility due to generalized weakness, poor balance and failure to thrive.  At the infusion center he was noted to have a rapid heart rate with irregular rhythm and he was transferred to the ED for further evaluation. ?  ?OT comments ? Treatment focused on activity tolerance in the setting of ADLs at edge of bed. Patient appears weak and tired. He asks what day of the week it is. Patient performed 15 minutes of grooming at edge of bed. Patient needing to lie back down in order for port to be assessed. Patient stood with mod assist from low bed height and took two steps to head of the bed with therapist assistance. Patient staggered and appeared less responsive. BP checked once patient back in supine - took approx 2 minutes to get a reading and BP 93/56. Reports he was "a little dizzy" with standing. Patient presents with continued weakness, activity tolerance and impaired balance without improvement in orthostatic hypotension. Upgraded POC to now recommend SNF at discharge. Patient agreeable. Patient left in care of RN.  ? ?Recommendations for follow up therapy are one component of a multi-disciplinary discharge planning process, led by the attending physician.  Recommendations may be updated based on patient status, additional functional criteria and insurance authorization. ?   ?Follow Up Recommendations ? Skilled nursing-short term rehab (<3 hours/day)  ?  ?Assistance Recommended at  Discharge Frequent or constant Supervision/Assistance  ?Patient can return home with the following ? A lot of help with walking and/or transfers;A lot of help with bathing/dressing/bathroom;Assistance with cooking/housework;Help with stairs or ramp for entrance ?  ?Equipment Recommendations ? Tub/shower seat  ?  ?Recommendations for Other Services   ? ?  ?Precautions / Restrictions Precautions ?Precautions: Fall ?Precaution Comments: orthostatic ?Restrictions ?Weight Bearing Restrictions: No  ? ? ?  ? ?Mobility Bed Mobility ?  ?  ?  ?  ?  ?  ?  ?  ?  ? ?Transfers ?  ?  ?  ?  ?  ?  ?  ?  ?  ?  ?  ?  ?Balance Overall balance assessment: Needs assistance ?Sitting-balance support: No upper extremity supported, Feet supported ?Sitting balance-Leahy Scale: Good ?  ?  ?  ?Standing balance-Leahy Scale: Poor ?Standing balance comment: reliant on external assistance ?  ?  ?  ?  ?  ?  ?  ?  ?  ?  ?  ?   ? ?ADL either performed or assessed with clinical judgement  ? ?ADL Overall ADL's : Needs assistance/impaired ?  ?  ?Grooming: Sitting;Wash/dry face;Oral care;Brushing hair ?Grooming Details (indicate cue type and reason): Patient perform grooming tasks at edge of bed x 15 min ?  ?  ?  ?  ?  ?  ?  ?  ?  ?  ?  ?  ?  ?  ?  ?General ADL Comments: Patient mod assist to transfer to side of bed needing assistance for trunk and legs. Patient able to perform 15 min seated activity. Patient stood and took two steps to head  of the bed to return to supine (as needed for nurse to assive port) and patient staggered and needed physical assistance to maintain upright. Once patient supine and BP ascertained (took at least 2 minutes) BP 93/56. Suspect lower when in standing. ?  ? ?Extremity/Trunk Assessment   ?  ?  ?  ?  ?  ? ?Vision   ?  ?  ?Perception   ?  ?Praxis   ?  ? ?Cognition Arousal/Alertness: Awake/alert ?Behavior During Therapy: [facility] for tasks assessed/performed ?Overall Cognitive Status: Within Functional Limits for tasks assessed ?   ?  ?  ?  ?  ?  ?  ?  ?  ?  ?  ?  ?  ?  ?  ?  ?  ?  ?  ?   ?Exercises   ? ?  ?Shoulder Instructions   ? ? ?  ?General Comments    ? ? ?Pertinent Vitals/ Pain       Pain Assessment ?Pain Assessment: No/denies pain ? ?Home Living   ?  ?  ?  ?  ?  ?  ?  ?  ?  ?  ?  ?  ?  ?  ?  ?  ?  ?  ? ?  ?Prior Functioning/Environment    ?  ?  ?  ?   ? ?Frequency ? Min 2X/week  ? ? ? ? ?  ?Progress Toward Goals ? ?OT Goals(current goals can now be found in the care plan section) ? Progress towards OT goals: Progressing toward goals ? ?Acute Rehab OT Goals ?Patient Stated Goal: get stronger ?OT Goal Formulation: With patient ?Time For Goal Achievement: 07/09/21 ?Potential to Achieve Goals: Fair  ?Plan Discharge plan needs to be updated   ? ?Co-evaluation ? ? ?   ?  ?  ?  ?  ? ?  ?AM-PAC OT "6 Clicks" Daily Activity     ?Outcome Measure ? ? Help from another person eating meals?: A Little ?Help from another person taking care of personal grooming?: A Little ?Help from another person toileting, which includes using toliet, bedpan, or urinal?: A Lot ?Help from another person bathing (including washing, rinsing, drying)?: A Lot ?Help from another person to put on and taking off regular upper body clothing?: A Lot ?Help from another person to put on and taking off regular lower body clothing?: A Lot ?6 Click Score: 14 ? ?  ?End of Session   ? ?OT Visit Diagnosis: Muscle weakness (generalized) (M62.81);Adult, failure to thrive (R62.7) ?  ?Activity Tolerance Patient tolerated treatment well ?  ?Patient Left in bed;with call bell/phone within reach;with bed alarm set ?  ?Nurse Communication Mobility status (to assess port) ?  ? ?   ? ?Time: 7026-3785 ?OT Time Calculation (min): 27 min ? ?Charges: OT General Charges ?$OT Visit: 1 Visit ?OT Treatments ?$Self Care/Home Management : 23-37 mins ? ?Gabrielly Mccrystal, OTR/L ?Acute Care Rehab Services  ?Office [phone] ?Pager: [phone]  ? ?Yanis Larin L Rayjon Wery ?06/27/2021, 9:02 AM ?

## 2021-06-27 NOTE — Progress Notes (Signed)
?Progress Note ? ? ?Patient: Lawrence Hall WCH:[id number] DOB: 07-May-1945 DOA: 06/24/2021     3 ?DOS: the patient was seen and examined on 06/27/2021 ?  ?Brief hospital course: ?76 y.o. 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 Hypertension, dyslipidemia, paroxysmal atrial fibrillation, coronary artery disease sp CABG, and bioprosthetic aortic valve replacement.  ?  ?Patient has been very weak and deconditioned, poor mobility due to generalized weakness, poor balance and failure to thrive. He has been treated with weekly IV fluids for hydration per Dr Lawrence Hall as part of palliative care.  ?Today before getting to the infusion center he felt palpitations, moderate in intensity, associated with dyspnea but not chest pain, no improving or worsening factors. ?At the infusion center he was noted to have a rapid heart rate with irregular rhythm and he was transferred to the ED for further evaluation.  ? ?Assessment and Plan: ?* Atrial flutter ([place]) ?Patient was initially placed on diltiazem for rate control. ?Heart rate now improved. Transition to PO cardizem  ? ?Now on metoprolol to 50 mg po bid and continue diltiazem drip. ?Patient with advanced cancer, now under palliative care. ?There were concerns of increased risk of bleeding thus held on anticoagulation.  ? ?Given concerns of dehydration, have continued on NS at 100cc/hr ? ?His echocardiogram from 2020 had a preserved LV systolic function with no significant valvular disease. ? ?Repeat 2d echo with EF 40-45%, which is lower than previous. Pt also with wall motion noted. Have discussed with Cardiology who will see in consultation ? ?Hyponatremia ?Na is 130 at presentation ?Clinically dehydrated on exam with dry mucus membranes and poor skin turgor ?Pt reports limited PO intake leading up to admit ?Have continued pt on NS IVF ?Recheck bmet in  AM ? ?Hyperbilirubinemia ?Patient with clinical icterus and elevated total bilirubin. ?Likely worsening metastatic disease. ? ?Acute on chronic liver failure.  ?Plan to check liver US with dopplers and follow up liver function in am.  ?He has chronic elevation of AST and ALT.  ?Ammonia levels normal ? ?Hyperlipidemia ?Continue with statin therapy  ? ?Essential hypertension ?Continue blood pressure monitoring. ?Hold on antihypertensive medications.  ? ?CAD (coronary artery disease), native coronary artery ?Pt is s/p CABG in 1/20 ?No chest pain.  ?Continue blood pressure monitoring.  ?Echo reviewed, with reduced EF of 40-45% with wall motion abnormality ?Discussed with Cardiology, who will see in consultation ? ?Malignant tumor of renal pelvis, left (HCC) ?Renal pelvis tumor in 2021, then developed stage IV high grade urothelial carcinoma with hepatic as well as bone metastasis.  ? ?Patient currently under palliative care. ?Getting IV fluids once a week as outpatient.  ? ? ?Depression ?Continue with mirtazapine.  ? ?Chronic anemia ?Cell count with hgb stable at 7.5 ?Plan to continue follow up on cell count. ?Anemia is associated to malignancy.  ? ?Orthostatic hypotension ?Recently noted to have BP rapidly dropping on standing ?Pt clinically dehydrated with limited PO intake ?Somewhat improved with IVF hydration ? ? ? ? ?  ? ?Subjective: Reports feeling better, still not resting well at night ? ?Physical Exam: ?Vitals:  ? 06/27/21 0500 06/27/21 0900 06/27/21 1345 06/27/21 1429  ?BP:  115/71 124/82 112/71  ?Pulse:      ?Resp:  16    ?Temp: 98 ?F (36.7 ?C)  98.2 ?F (36.8 ?C)   ?TempSrc: Oral  Oral   ?SpO2:      ?  Weight:      ?Height:      ? ?General exam: Awake, laying in bed, in nad ?Respiratory system: Normal respiratory effort, no wheezing ?Cardiovascular system: regular rate, s1, s2 ?Gastrointestinal system: Soft, nondistended, positive BS ?Central nervous system: CN2-12 grossly intact, strength intact ?Extremities:  Perfused, no clubbing ?Skin: Normal skin turgor, no notable skin lesions seen ?Psychiatry: Mood normal // no visual hallucinations  ? ?Data Reviewed: ? ?Na 131 ? ?Family Communication: Pt in room, wife at bedside ? ?Disposition: ?Status is: Inpatient ?Remains inpatient appropriate because: Severity of illness ? Planned Discharge Destination: Home with Home Health ? ? ? ?Author: ?Lawrence Lund, MD ?06/27/2021 4:53 PM ? ?For on call review [email or web address]  ?

## 2021-06-27 NOTE — Progress Notes (Signed)
Physical Therapy Treatment ?Patient Details ?Name: Lawrence Hall ?MRN: [id number] ?DOB: May 20, 1945 ?Today's Date: 06/27/2021 ? ? ?History of Present Illness Lawrence Hall is a 76 y.o. male who has been very weak and deconditioned, poor mobility due to generalized weakness, poor balance and failure to thrive.  At the infusion center he was noted to have a rapid heart rate with irregular rhythm and he was transferred to the ED and admitted 06/24/21 with A flutter. 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 Hypertension, dyslipidemia, paroxysmal atrial fibrillation, coronary artery disease sp CABG, and bioprosthetic aortic valve replacement ? ?  ?PT Comments  ? ? Pt very lethargic and fatigued easily during PT session.  He had slight drop in BP and easily fatigued but was able to ambulate 4'x3 with close chair follow.  Updated plan of care to SNF at d/c.     ?Recommendations for follow up therapy are one component of a multi-disciplinary discharge planning process, led by the attending physician.  Recommendations may be updated based on patient status, additional functional criteria and insurance authorization. ? ?Follow Up Recommendations ? Skilled nursing-short term rehab (<3 hours/day) ?  ?  ?Assistance Recommended at Discharge Frequent or constant Supervision/Assistance  ?Patient can return home with the following A lot of help with walking and/or transfers;Assistance with cooking/housework;Direct supervision/assist for financial management;Assist for transportation;A lot of help with bathing/dressing/bathroom ?  ?Equipment Recommendations ? Rolling walker (2 wheels);BSC/3in1;Other (comment) (transport chair)  ?  ?Recommendations for Other Services   ? ? ?  ?Precautions / Restrictions Precautions ?Precautions: Fall ?Precaution Comments: orthostatic  ?  ? ?Mobility ? Bed  Mobility ?Overal bed mobility: Needs Assistance ?Bed Mobility: Supine to Sit ?  ?  ?Supine to sit: Mod assist ?  ?  ?General bed mobility comments: increased time, cues for sequencing and mod A ?  ? ?Transfers ?Overall transfer level: Needs assistance ?Equipment used: Rolling walker (2 wheels) ?Transfers: Sit to/from Stand ?Sit to Stand: Mod assist, +2 safety/equipment ?  ?  ?  ?  ?  ?General transfer comment: Performed x 3; mod A to rise with cues for hand placement; had 2 people present b/c of orthostatic bp hx ?  ? ?Ambulation/Gait ?Ambulation/Gait assistance: Min assist, +2 safety/equipment ?Gait Distance (Feet): 4 Feet (4'x3) ?Assistive device: Rolling walker (2 wheels) ?Gait Pattern/deviations: Step-to pattern, Decreased stride length, Shuffle ?Gait velocity: decreased ?  ?  ?General Gait Details: Pt requiring close chair follow and min A of 2 for safety.  Required cues and assist for RW and min A to steady.  Pt fatigues very easily.  Ambulated 4'x3 with close chair follow for seated rest breaks ? ? ?Stairs ?  ?  ?  ?  ?  ? ? ?Wheelchair Mobility ?  ? ?Modified Rankin (Stroke Patients Only) ?  ? ? ?  ?Balance Overall balance assessment: Needs assistance ?Sitting-balance support: No upper extremity supported, Feet supported ?Sitting balance-Leahy Scale: Good ?  ?  ?Standing balance support: Bilateral upper extremity supported, Reliant on assistive device for balance ?Standing balance-Leahy Scale: Poor ?Standing balance comment: RW and min A ?  ?  ?  ?  ?  ?  ?  ?  ?  ?  ?  ?  ? ?  ?Cognition Arousal/Alertness: Lethargic ?Behavior During Therapy: [facility] for tasks assessed/performed ?Overall Cognitive Status: Within Functional Limits for tasks assessed ?  ?  ?  ?  ?  ?  ?  ?  ?  ?  ?  ?  ?  ?  ?  ?  ?  General Comments: Pt sleeping at arrival but arousable and willing to participate with therapy . Required increased time to arouse with increased cues ?  ?  ? ?  ?Exercises General Exercises - Lower Extremity ?Ankle  Circles/Pumps: AROM, Both, 10 reps, Seated ?Long Arc Quad: AROM, Both, 10 reps, Seated ? ?  ?General Comments   ?Orthostatic Blood Pressures: ?Supine 120/79 ?Sitting 123/67 ?Sitting 3 mins 126/84 ?Standing 115/64 (did not report syncopal symptoms but fatigued easily) ?Post session in recliner 116/67 ?  ?  ? ?Pertinent Vitals/Pain Pain Assessment ?Pain Assessment: No/denies pain  ? ? ?Home Living   ?  ?  ?  ?  ?  ?  ?  ?  ?  ?   ?  ?Prior Function    ?  ?  ?   ? ?PT Goals (current goals can now be found in the care plan section) Progress towards PT goals: Not progressing toward goals - comment (limited due to fatigue/orthostatic) ? ?  ?Frequency ? ? ? Min 2X/week ? ? ? ?  ?PT Plan Frequency needs to be updated;Discharge plan needs to be updated  ? ? ?Co-evaluation   ?  ?  ?  ?  ? ?  ?AM-PAC PT "6 Clicks" Mobility   ?Outcome Measure ? Help needed turning from your back to your side while in a flat bed without using bedrails?: A Little ?Help needed moving from lying on your back to sitting on the side of a flat bed without using bedrails?: A Lot ?Help needed moving to and from a bed to a chair (including a wheelchair)?: A Lot ?Help needed standing up from a chair using your arms (e.g., wheelchair or bedside chair)?: A Lot ?Help needed to walk in hospital room?: A Lot ?Help needed climbing 3-5 steps with a railing? : Total ?6 Click Score: 12 ? ?  ?End of Session Equipment Utilized During Treatment: Gait belt ?Activity Tolerance: Patient limited by fatigue ?Patient left: with chair alarm set;in chair;with call bell/phone within reach ?Nurse Communication: Mobility status ?PT Visit Diagnosis: Difficulty in walking, not elsewhere classified (R26.2);Muscle weakness (generalized) (M62.81);Other abnormalities of gait and mobility (R26.89) ?  ? ? ?Time: 6948-5462 ?PT Time Calculation (min) (ACUTE ONLY): 19 min ? ?Charges:  $Therapeutic Activity: 8-22 mins          ?          ? ?Abran Richard, PT ?Acute Rehab Services ?Pager  [phone] ?[facility] [phone] ? ? ? ?Lawrence Hall ?06/27/2021, 1:59 PM ? ?

## 2021-06-28 ENCOUNTER — Inpatient Hospital Stay (HOSPITAL_COMMUNITY): Payer: PPO

## 2021-06-28 DIAGNOSIS — I484 Atypical atrial flutter: Secondary | ICD-10-CM

## 2021-06-28 DIAGNOSIS — I429 Cardiomyopathy, unspecified: Secondary | ICD-10-CM

## 2021-06-28 DIAGNOSIS — C7910 Secondary malignant neoplasm of unspecified urinary organs: Secondary | ICD-10-CM

## 2021-06-28 DIAGNOSIS — Z7189 Other specified counseling: Secondary | ICD-10-CM

## 2021-06-28 DIAGNOSIS — I4891 Unspecified atrial fibrillation: Secondary | ICD-10-CM | POA: Diagnosis not present

## 2021-06-28 DIAGNOSIS — D649 Anemia, unspecified: Secondary | ICD-10-CM | POA: Diagnosis not present

## 2021-06-28 LAB — COMPREHENSIVE METABOLIC PANEL
ALT: 27 U/L (ref 0–44)
AST: 35 U/L (ref 15–41)
Albumin: 1.9 g/dL — ABNORMAL LOW (ref 3.5–5.0)
Alkaline Phosphatase: 252 U/L — ABNORMAL HIGH (ref 38–126)
Anion gap: 5 (ref 5–15)
BUN: 15 mg/dL (ref 8–23)
CO2: 20 mmol/L — ABNORMAL LOW (ref 22–32)
Calcium: 9 mg/dL (ref 8.9–10.3)
Chloride: 108 mmol/L (ref 98–111)
Creatinine, Ser: 0.92 mg/dL (ref 0.61–1.24)
GFR, Estimated: >60 mL/min (ref >60)
Glucose, Bld: 105 mg/dL — ABNORMAL HIGH (ref 70–99)
Potassium: 4 mmol/L (ref 3.5–5.1)
Sodium: 133 mmol/L — ABNORMAL LOW (ref 135–145)
Total Bilirubin: 2.1 mg/dL — ABNORMAL HIGH (ref 0.3–1.2)
Total Protein: 5.1 g/dL — ABNORMAL LOW (ref 6.5–8.1)

## 2021-06-28 LAB — CBC
HCT: 22.8 % — ABNORMAL LOW (ref 39.0–52.0)
Hemoglobin: 7.3 g/dL — ABNORMAL LOW (ref 13.0–17.0)
MCH: 30.3 pg (ref 26.0–34.0)
MCHC: 32 g/dL (ref 30.0–36.0)
MCV: 94.6 fL (ref 80.0–100.0)
Platelets: 180 10*3/uL (ref 150–400)
RBC: 2.41 MIL/uL — ABNORMAL LOW (ref 4.22–5.81)
RDW: 18.8 % — ABNORMAL HIGH (ref 11.5–15.5)
WBC: 9.4 10*3/uL (ref 4.0–10.5)
nRBC: 0 % (ref 0.0–0.2)

## 2021-06-28 MED ORDER — METOPROLOL SUCCINATE ER 100 MG PO TB24
100.0000 mg | ORAL_TABLET | Freq: Every day | ORAL | Status: DC
  Administered 2021-06-29 – 2021-07-06 (×7): 100 mg via ORAL
  Filled 2021-06-28 (×10): qty 1

## 2021-06-28 NOTE — Progress Notes (Signed)
?Progress Note ? ? ?Patient: Lawrence Hall EGB:[id number] DOB: November 16, 1945 DOA: 06/24/2021     4 ?DOS: the patient was seen and examined on 06/28/2021 ?  ?Brief hospital course: ?76 y.o. 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 Hypertension, dyslipidemia, paroxysmal atrial fibrillation, coronary artery disease sp CABG, and bioprosthetic aortic valve replacement.  ?  ?Patient has been very weak and deconditioned, poor mobility due to generalized weakness, poor balance and failure to thrive. He has been treated with weekly IV fluids for hydration per Dr Alen Blew as part of palliative care.  ?Today before getting to the infusion center he felt palpitations, moderate in intensity, associated with dyspnea but not chest pain, no improving or worsening factors. ?At the infusion center he was noted to have a rapid heart rate with irregular rhythm and he was transferred to the ED for further evaluation.  ? ?Assessment and Plan: ?* Atrial flutter ([place]) ?Patient was initially placed on diltiazem for rate control, later transitioned to PO cardizem with metoprolol '50mg'$  bid ? ?Patient with advanced cancer, now under palliative care. ?There were concerns of increased risk of bleeding thus held on anticoagulation.  ? ?Given concerns of dehydration, have continued on NS at 100cc/hr ? ?His echocardiogram from 2020 had a preserved LV systolic function with no significant valvular disease. ? ?Repeat 2d echo with EF 40-45%, which is lower than previous. Pt also with wall motion noted.  ? ?[facility] input. Pt now on metoprolol '100mg'$  with cardizem d/c'd ? ?Likely goal here is rate control without anticoagulation, knowing pt has increased risk for stroke down the road. Overall difficult situation ? ?Hyponatremia ?Na is 130 at presentation ?Pt reports limited PO intake leading up to admit ?Na improved to 133  after IVF hydration ?Recheck bmet in AM ? ?Hyperbilirubinemia ?Patient with clinical icterus and elevated total bilirubin. ?Likely worsening metastatic disease. ? ?Acute on chronic liver failure.  ?Plan to check liver US with dopplers and follow up liver function in am.  ?He has chronic elevation of AST and ALT.  ?Ammonia levels normal ? ?Hyperlipidemia ?Continue with statin therapy  ? ?Essential hypertension ?Continue blood pressure monitoring. ?Hold on antihypertensive medications.  ? ?CAD (coronary artery disease), native coronary artery ?Pt is s/p CABG in 1/20 ?No chest pain.  ?Continue blood pressure monitoring.  ?Echo reviewed, with reduced EF of 40-45% with wall motion abnormality ?[facility] input. Not candidate for invasive work up. Medical management planned ? ?Malignant tumor of renal pelvis, left (HCC) ?Renal pelvis tumor in 2021, then developed stage IV high grade urothelial carcinoma with hepatic as well as bone metastasis.  ? ?Patient currently under palliative care. ?Getting IV fluids once a week as outpatient.  ? ? ?Depression ?Continue with mirtazapine.  ? ?Chronic anemia ?Cell count with hgb stable at 7.5 ?Plan to continue follow up on cell count. ?Anemia is associated to malignancy.  ? ?Orthostatic hypotension ?Recently noted to have BP rapidly dropping on standing ?Pt clinically dehydrated with limited PO intake ?Somewhat improved with IVF hydration ? ? ?  ? ?Subjective: States feeling better today. Still not really sleeping that well overnight ? ?Physical Exam: ?Vitals:  ? 06/27/21 2200 06/27/21 2300 06/28/21 0400 06/28/21 1005  ?BP: 110/66 120/66 125/71 140/69  ?Pulse:   79 (!) 104  ?Resp:   15   ?Temp:   99.2 ?F (37.3 ?C)   ?TempSrc:   Oral   ?  SpO2:   98%   ?Weight:      ?Height:      ? ?General exam: Conversant, in no acute distress ?Respiratory system: normal chest rise, clear, no audible wheezing ?Cardiovascular system: regular rhythm, s1-s2 ?Gastrointestinal system:  Nondistended, nontender, pos BS ?Central nervous system: No seizures, no tremors ?Extremities: No cyanosis, no joint deformities ?Skin: No rashes, no pallor ?Psychiatry: Affect normal // no auditory hallucinations  ? ?Data Reviewed: ? ?Na 133 ? ?Family Communication: Pt in room, wife at bedside ? ?Disposition: ?Status is: Inpatient ?Remains inpatient appropriate because: Severity of illness ? Planned Discharge Destination: Home with Home Health ? ? ? ?Author: ?Marylu Lund, MD ?06/28/2021 2:02 PM ? ?For on call review [email or web address].  ?

## 2021-06-28 NOTE — Consult Note (Signed)
Cardiology Consultation:   Patient ID: Lawrence Hall MRN: [id number]; DOB: 1946-04-08  Admit date: 06/24/2021 Date of Consult: 06/28/2021  PCP:  Lawrence Macadam, MD   [facility] Providers Cardiologist:  Lawrence Klein, MD        Patient Profile:   Lawrence Hall is a 76 y.o. male with a hx of stage 4 urothelial carcinoma, hypertension, paroxysmal atrial fibrillation, CAD with history of CABG, bioprosthetic AVR who is being seen 06/28/2021 for the evaluation of atrial flutter at the request of Lawrence Hall.  History of Present Illness:   Lawrence Hall has been receiving scheduled IV fluid infusions as a part of his palliative care plan for metastatic cancer. While at the infusion center, he was noted to have a rapid, irregular heart rate and was sent to the ER for further evaluation.  Echo noted EF 40-45%, prior normal.  Patient and his wife present for interview. He reports feeling better today than he has felt in some time. He falls asleep easily and awakens intermittently during the meeting. Denies chest pain, breathing has been stable. We reviewed the change in his EF on his recent echo and discussed how we manage this.  Patient's wife is very involved in his care, has many questions re: metoprolol dosing, metoprolol vs diltiazem, blood thinners, aspirin vs. Other thinners, etc. We discussed this extensively and summarized below.   Past Medical History:  Diagnosis Date   Arthritis    mild hands   Asthma    pt denies 4/21    Chest pain 07/25/2012   Coronary artery disease    Dysrhythmia    PAC'S, hx of atrial fib after OHS 04/2018   ED (erectile dysfunction)    H/O hiatal hernia    Heart murmur    Hypercholesterolemia    Hypertension    MARGINALLY HIGH - NO MEDS -MONITORS   Metastatic cancer to bone ([place]) dx'd 07/2020   rt humerus   Myocardial infarction ([place])    2017    Pneumonia    hx of 2018    PONV (postoperative nausea and vomiting)    Prostate cancer ([place]) 03/27/2011    prostate bx=adenocarcinoma,   Skin cancer 2008   basal cell face /removed   Sleep apnea    borderline sleep apnea per patient no devices    Urothelial cancer ([place]) dx'd 05/2019   left renal ca    Past Surgical History:  Procedure Laterality Date   AORTIC VALVE REPLACEMENT N/A 04/29/2018   Procedure: AORTIC VALVE REPLACEMENT (AVR) using INSPIRIS RESILIA  AORTIC VALVE 55m;  Surgeon: VIvin Poot MD;  [facility]  Service: Open Heart Surgery;  Laterality: N/A;   basal cell cancer  2008   inside nose   CARDIAC CATHETERIZATION N/A 06/03/2015   Procedure: Left Heart Cath and Coronary Angiography;  Surgeon: TTroy Sine MD;  [facility];  Service: Cardiovascular;  Laterality: N/A;   CATARACT EXTRACTION, BILATERAL  2007   CORONARY ARTERY BYPASS GRAFT N/A 04/29/2018   Procedure: CORONARY ARTERY BYPASS GRAFTING (CABG) X 3; Using Left Internal Mammary Artery (LIMA) and Right Leg Greater Saphenous Vein Graft (SVG);  LIMA to LAD, SVG to OM1, SVG to RCA,;  Surgeon: VIvin Poot MD;  Location: [facility]  Service: Open Heart Surgery;  Laterality: N/A;   CYSTOSCOPY/RETROGRADE/URETEROSCOPY Left 08/21/2019   Procedure: CYSTOSCOPY/RETROGRADE/URETEROSCOPY/  LEFT BIOPSY/BRUSH BIOPSY/RENAL WASHINGS/ AND STENT PLACEMENT;  Surgeon: BRaynelle Bring MD;  Location: [facility];  Service: Urology;  Laterality: Left;   IR IMAGING GUIDED PORT INSERTION  09/28/2019   RIGHT/LEFT HEART CATH AND CORONARY ANGIOGRAPHY N/A 04/26/2018   Procedure: RIGHT/LEFT HEART CATH AND CORONARY ANGIOGRAPHY;  Surgeon: [name] Sine, MD;  Location: [facility];  Service: Cardiovascular;  Laterality: N/A;   ROBOT ASSISTED LAPAROSCOPIC RADICAL PROSTATECTOMY  08/10/2011   Procedure: ROBOTIC ASSISTED LAPAROSCOPIC RADICAL PROSTATECTOMY LEVEL 2;  Surgeon: Dutch Gray, MD;  Location: [facility];  Service: Urology;  Laterality: N/A;   ROBOT ASSITED LAPAROSCOPIC NEPHROURETERECTOMY Left 01/04/2020   Procedure: XI ROBOT ASSITED  LAPAROSCOPIC NEPHROURETERECTOMY;  Surgeon: Raynelle Bring, MD;  [facility];  Service: Urology;  Laterality: Left;  NEEDS 240 MIN FOR PROCEDURE   TEE WITHOUT CARDIOVERSION N/A 04/29/2018   Procedure: TRANSESOPHAGEAL ECHOCARDIOGRAM (TEE);  Surgeon: Prescott Gum, Collier Salina, MD;  Location: [place];  Service: Open Heart Surgery;  Laterality: N/A;   THYROID SURGERY  infant   uncertain details; no thyroid replacement listed   WISDOM TOOTH EXTRACTION       Home Medications:  Prior to Admission medications   Medication Sig Start Date End Date Taking? Authorizing Provider  aspirin EC 81 MG tablet Take 1 tablet (81 mg total) by mouth daily. Swallow whole. Patient taking differently: Take 81 mg by mouth at bedtime. Swallow whole. 01/24/20  Yes [name], Junell C, MD  atorvastatin (LIPITOR) 80 MG tablet TAKE 1 TABLET ONCE DAILY AT 6 PM. Patient taking differently: Take 80 mg by mouth daily at 6 PM. 07/25/20  Yes [name], Mihai, MD  Cholecalciferol (VITAMIN D) 50 MCG (2000 UT) tablet Take 2,000 Units by mouth daily.   Yes [provider]  megestrol (MEGACE ES) 625 MG/5ML suspension Take 5 mLs (625 mg total) by mouth daily. 06/04/21  Yes Wyatt Portela, MD  metoprolol succinate (TOPROL-XL) 50 MG 24 hr tablet TAKE ONE TABLET AT BEDTIME. 06/16/21  Yes [name], Mihai, MD  Milk Thistle 1000 MG CAPS Take 1,000 mg by mouth every other day.   Yes [provider]  mirtazapine (REMERON) 15 MG tablet Take 1 tablet (15 mg total) by mouth at bedtime. 02/03/21  Yes [name], Junell C, MD  nitroGLYCERIN (NITROSTAT) 0.4 MG SL tablet 1 TAB UNDER TONGUE AS NEEDED FOR CHEST PAIN. MAY REPEAT EVERY 5 MIN FOR A TOTAL OF 3 DOSES. 06/21/19  Yes [name], Mihai, MD  oxyCODONE (OXY IR/ROXICODONE) 5 MG immediate release tablet Take 1 tablet (5 mg total) by mouth every 4 (four) hours as needed for breakthrough pain or moderate pain. 02/20/21  Yes Donne Hazel, MD  polyethylene glycol (MIRALAX / GLYCOLAX) 17 g packet Take  17 g by mouth daily. Patient taking differently: Take 17 g by mouth daily as needed (constipation). 01/26/21  Yes Arrien, Jimmy Picket, MD  prochlorperazine (COMPAZINE) 10 MG tablet TAKE 1 TABLET EVERY 6 HOURS AS NEEDED FOR NAUSEA & VOMITING. 06/11/21  Yes Wyatt Portela, MD    Inpatient Medications: Scheduled Meds:  aspirin EC  81 mg Oral QHS   atorvastatin  80 mg Oral q1800   Chlorhexidine Gluconate Cloth  6 each Topical Daily   feeding supplement  237 mL Oral TID BM   megestrol  400 mg Oral Daily   [START ON 06/29/2021] metoprolol succinate  100 mg Oral Daily   metoprolol tartrate  50 mg Oral BID   mirtazapine  15 mg Oral QHS   multivitamin with minerals  1 tablet Oral Daily   polyethylene glycol  17 g Oral BID   sodium  chloride flush  10-40 mL Intracatheter Q12H   Continuous Infusions:  PRN Meds: acetaminophen **OR** acetaminophen, ondansetron **OR** ondansetron (ZOFRAN) IV, oxyCODONE, sodium chloride flush, zolpidem  Allergies:    Allergies  Allergen Reactions   Other Itching and Other (See Comments)    Pet dander, seasonal allergies = Runny nose, itchy eyes    Social History:   Social History   Socioeconomic History   Marital status: Married    Spouse name: [name]    Number of children: 0   Years of education: 15 years    Highest education level: Not on file  Occupational History    Employer: [employer]    Comment: customer service [employer]  Tobacco Use   Smoking status: Never   Smokeless tobacco: Never  Vaping Use   Vaping Use: Never used  Substance and Sexual Activity   Alcohol use: Not Currently   Drug use: No   Sexual activity: Not on file  Other Topics Concern   Not on file  Social History Narrative   Not on file   Social Determinants of Health   Financial Resource Strain: Low Risk    Difficulty of Paying Living Expenses: Not hard at all  Food Insecurity: Not on file  Transportation Needs: No Transportation Needs   Lack of Transportation  (Medical): No   Lack of Transportation (Non-Medical): No  Physical Activity: Insufficiently Active   Days of Exercise per Week: 1 day   Minutes of Exercise per Session: 10 min  Stress: Stress Concern Present   Feeling of Stress : To some extent  Social Connections: Not on file  Intimate Partner Violence: Not on file    Family History:    Family History  Problem Relation Age of Onset   Prostate cancer Father 23       in his 70's/other comorbidities/79 deceased   Alcohol abuse Father    Liver disease Father    Heart attack Father 16   Breast cancer Mother 69       mets to bone,deceased age 73   Cervical cancer Sister    Cervical cancer Sister    Breast cancer Sister    CAD Brother        Died of sudden cardiac death/MI     ROS:  Please see the history of present illness.  Constitutional: Negative for chills, fever, night sweats, Positive for unintentional weight loss  HENT: Negative for ear pain and hearing loss.   Eyes: Negative for loss of vision and eye pain.  Respiratory: Negative for sputum, wheezing.  Positive for mild cough Cardiovascular: See HPI. Gastrointestinal: Negative for abdominal pain, melena, and hematochezia. Positive for constipation. Genitourinary: Negative for dysuria and hematuria.  Musculoskeletal: Negative for falls and myalgias.  Skin: Negative for itching and rash.  Neurological: Negative for focal weakness, focal sensory changes and loss of consciousness.  Endo/Heme/Allergies: Does bruise/bleed easily.   All other ROS reviewed and negative.     Physical Exam/Data:   Vitals:   06/27/21 2200 06/27/21 2300 06/28/21 0400 06/28/21 1005  BP: 110/66 120/66 125/71 140/69  Pulse:   79 (!) 104  Resp:   15   Temp:   99.2 F (37.3 C)   TempSrc:   Oral   SpO2:   98%   Weight:      Height:        Intake/Output Summary (Last 24 hours) at 06/28/2021 1119 Last data filed at 06/28/2021 0514 Gross per 24 hour  Intake 397 ml  Output --  Net 397 ml    Last 3 Weights 06/24/2021 06/24/2021 06/24/2021  Weight (lbs) 145 lb 11.6 oz 150 lb 143 lb 12 oz  Weight (kg) 66.1 kg 68.04 kg 65.205 kg     Body mass index is 19.76 kg/m.  General:  frail, pale appearing elderly gentleman in NAD HEENT: normal Neck: no JVD Vascular: No carotid bruits; Distal pulses 2+ bilaterally Cardiac:  normal S1, S2; RRR; no murmur  Lungs:  clear to auscultation bilaterally, no wheezing, rhonchi or rales  Abd: soft, nontender, no hepatomegaly  Ext: no edema Musculoskeletal:  No deformities, moves all 4 limbs independently Skin: warm and dry  Neuro:  no focal abnormalities noted Psych:  Normal affect, falls asleep easily  EKG:  The EKG was personally reviewed and demonstrates:  atypical atrial flutter, rvr at 118 bpm Telemetry:  Telemetry was personally reviewed and demonstrates:  atypical atrial flutter, predominantly in the upper 70s with occasional elevated rates to the 100s, steep plateau pattern consistent with flutter  Relevant CV Studies: Echo 06/25/21  1. Mild global hypokinesis with focal moderate hypokinesis in the entire  septal wall. Left ventricular ejection fraction, by estimation, is 40 to  45%. The left ventricle has mildly decreased function. The left ventricle  demonstrates regional wall motion   abnormalities (see scoring diagram/findings for description). There is  mild concentric left ventricular hypertrophy. Left ventricular diastolic  parameters are indeterminate. There is hypokinesis of the left  ventricular, entire septal wall.   2. Right ventricular systolic function is normal. The right ventricular  size is normal. There is normal pulmonary artery systolic pressure.   3. Left atrial size was moderately dilated.   4. Right atrial size was mildly dilated.   5. The mitral valve is abnormal. Mild mitral valve regurgitation. No  evidence of mitral stenosis.   6. The aortic valve has been repaired/replaced. Aortic valve  regurgitation is not  visualized. There is a 25 mm bioprosthetic valve  present in the aortic position. Procedure Date: 04/29/2018. Echo findings  are consistent with normal structure and function   of the aortic valve prosthesis.   7. The inferior vena cava is normal in size with greater than 50%  respiratory variability, suggesting right atrial pressure of 3 mmHg.   Comparison(s): Prior images reviewed side by side. Changes from prior  study are noted.   Conclusion(s)/Recommendation(s): Mild global hypokinesis with focal  moderate hypokinesis in the entire septal wall. Left ventricular ejection  fraction, by estimation, is 40 to 45%. This is a change from prior study.   Laboratory Data:  High Sensitivity Troponin:   Recent Labs  Lab 06/24/21 1420  TROPONINIHS 11     Chemistry Recent Labs  Lab 06/24/21 1420 06/25/21 0345 06/26/21 0109 06/27/21 0405 06/28/21 0423  NA 130*   < > 131* 131* 133*  K 3.9   < > 3.7 3.8 4.0  CL 103   < > 106 106 108  CO2 23   < > 22 20* 20*  GLUCOSE 103*   < > 92 114* 105*  BUN 19   < > '17 16 15  '$ CREATININE 0.87   < > 0.94 0.94 0.92  CALCIUM 9.1   < > 8.6* 8.8* 9.0  MG 1.9  --   --   --   --   GFRNONAA >60   < > >60 >60 >60  ANIONGAP 4*   < > 3* 5 5   < > = values in  this interval not displayed.    Recent Labs  Lab 06/26/21 0109 06/27/21 0405 06/28/21 0423  PROT 4.9* 5.3* 5.1*  ALBUMIN 1.9* 2.0* 1.9*  AST 31 37 35  ALT '30 28 27  '$ ALKPHOS 223* 259* 252*  BILITOT 2.9* 2.3* 2.1*   Lipids No results for input(s): CHOL, TRIG, HDL, LABVLDL, LDLCALC, CHOLHDL in the last 168 hours.  Hematology Recent Labs  Lab 06/25/21 0345 06/27/21 0405 06/28/21 0423  WBC 7.9 8.6 9.4  RBC 2.49* 2.38* 2.41*  HGB 7.5* 7.2* 7.3*  HCT 23.1* 22.7* 22.8*  MCV 92.8 95.4 94.6  MCH 30.1 30.3 30.3  MCHC 32.5 31.7 32.0  RDW 19.2* 19.1* 18.8*  PLT 189 180 180   Thyroid No results for input(s): TSH, FREET4 in the last 168 hours.  BNP Recent Labs  Lab 06/24/21 1425  BNP  341.4*    DDimer No results for input(s): DDIMER in the last 168 hours.   Radiology/Studies:  DG Chest Portable 1 View  Result Date: 06/24/2021 CLINICAL DATA:  Chest pain. EXAM: PORTABLE CHEST 1 VIEW COMPARISON:  February 15, 2021. FINDINGS: The heart size and mediastinal contours are within normal limits. Both lungs are clear. Sternotomy wires are noted. Right internal jugular Port-A-Cath is unchanged in position. The visualized skeletal structures are unremarkable. IMPRESSION: No active disease. Electronically Signed   By: Marijo Conception M.D.   On: 06/24/2021 14:35   ECHOCARDIOGRAM COMPLETE  Result Date: 06/25/2021    ECHOCARDIOGRAM REPORT   Patient Name:   JAYDIN BONIFACE [facility] Date of Exam: 06/25/2021 Medical Rec #:  [id number]    Height:       72.0 in Accession #:    [id number]   Weight:       145.7 lb Date of Birth:  [birth year]    BSA:          1.862 m Patient Age:    6 years     BP:           114/59 mmHg Patient Gender: M            HR:           50 bpm. Exam Location:  Inpatient Procedure: 2D Echo Indications:    Dyspnea  History:        Patient has prior history of Echocardiogram examinations, most                 recent 02/21/2019. CAD and Previous Myocardial Infarction,                 Signs/Symptoms:Chest Pain and Murmur; Risk Factors:Hypertension.                 Aortic Valve: 25 mm bioprosthetic valve is present in the aortic                 position. Procedure Date: 04/29/2018.  Sonographer:    Arlyss Gandy Referring Phys: [id number] [place]  1. Mild global hypokinesis with focal moderate hypokinesis in the entire septal wall. Left ventricular ejection fraction, by estimation, is 40 to 45%. The left ventricle has mildly decreased function. The left ventricle demonstrates regional wall motion  abnormalities (see scoring diagram/findings for description). There is mild concentric left ventricular hypertrophy. Left ventricular diastolic parameters are indeterminate. There is  hypokinesis of the left ventricular, entire septal wall.  2. Right ventricular systolic function is normal. The right ventricular size is normal. There is normal pulmonary artery systolic pressure.  3.  Left atrial size was moderately dilated.  4. Right atrial size was mildly dilated.  5. The mitral valve is abnormal. Mild mitral valve regurgitation. No evidence of mitral stenosis.  6. The aortic valve has been repaired/replaced. Aortic valve regurgitation is not visualized. There is a 25 mm bioprosthetic valve present in the aortic position. Procedure Date: 04/29/2018. Echo findings are consistent with normal structure and function  of the aortic valve prosthesis.  7. The inferior vena cava is normal in size with greater than 50% respiratory variability, suggesting right atrial pressure of 3 mmHg. Comparison(s): Prior images reviewed side by side. Changes from prior study are noted. Conclusion(s)/Recommendation(s): Mild global hypokinesis with focal moderate hypokinesis in the entire septal wall. Left ventricular ejection fraction, by estimation, is 40 to 45%. This is a change from prior study. FINDINGS  Left Ventricle: Mild global hypokinesis with focal moderate hypokinesis in the entire septal wall. Left ventricular ejection fraction, by estimation, is 40 to 45%. The left ventricle has mildly decreased function. The left ventricle demonstrates regional wall motion abnormalities. The left ventricular internal cavity size was normal in size. There is mild concentric left ventricular hypertrophy. Abnormal (paradoxical) septal motion consistent with post-operative status. Left ventricular diastolic parameters are indeterminate. Right Ventricle: The right ventricular size is normal. No increase in right ventricular wall thickness. Right ventricular systolic function is normal. There is normal pulmonary artery systolic pressure. The tricuspid regurgitant velocity is 2.44 m/s, and  with an assumed right atrial pressure of  3 mmHg, the estimated right ventricular systolic pressure is 32.2 mmHg. Left Atrium: Left atrial size was moderately dilated. Right Atrium: Right atrial size was mildly dilated. Pericardium: There is no evidence of pericardial effusion. Mitral Valve: The mitral valve is abnormal. There is mild thickening of the mitral valve leaflet(s). There is mild calcification of the mitral valve leaflet(s). Mild mitral annular calcification. Mild mitral valve regurgitation. No evidence of mitral valve stenosis. Tricuspid Valve: The tricuspid valve is normal in structure. Tricuspid valve regurgitation is mild . No evidence of tricuspid stenosis. Aortic Valve: The aortic valve has been repaired/replaced. Aortic valve regurgitation is not visualized. Aortic valve mean gradient measures 4.0 mmHg. Aortic valve peak gradient measures 8.2 mmHg. Aortic valve area, by VTI measures 2.31 cm. There is a 25 mm bioprosthetic valve present in the aortic position. Procedure Date: 04/29/2018. Echo findings are consistent with normal structure and function of the aortic valve prosthesis. Pulmonic Valve: The pulmonic valve was not well visualized. Pulmonic valve regurgitation is trivial. No evidence of pulmonic stenosis. Aorta: The aortic root, ascending aorta, aortic arch and descending aorta are all structurally normal, with no evidence of dilitation or obstruction. Venous: The inferior vena cava is normal in size with greater than 50% respiratory variability, suggesting right atrial pressure of 3 mmHg. IAS/Shunts: The atrial septum is grossly normal.  LEFT VENTRICLE PLAX 2D LVIDd:         4.20 cm   Diastology LVIDs:         3.30 cm   LV e' medial:    8.81 cm/s LV PW:         1.10 cm   LV E/e' medial:  9.9 LV IVS:        1.20 cm   LV e' lateral:   15.80 cm/s LVOT diam:     2.20 cm   LV E/e' lateral: 5.5 LV SV:         60 LV SV Index:   32 LVOT Area:  3.80 cm  RIGHT VENTRICLE             IVC RV Basal diam:  3.70 cm     IVC diam: 2.00 cm RV  Mid diam:    3.00 cm RV S prime:     11.40 cm/s TAPSE (M-mode): 1.7 cm LEFT ATRIUM             Index        RIGHT ATRIUM           Index LA diam:        5.00 cm 2.68 cm/m   RA Area:     18.80 cm LA Vol (A2C):   43.0 ml 23.09 ml/m  RA Volume:   48.20 ml  25.88 ml/m LA Vol (A4C):   80.3 ml 43.12 ml/m LA Biplane Vol: 63.0 ml 33.83 ml/m  AORTIC VALVE AV Area (Vmax):    2.11 cm AV Area (Vmean):   2.15 cm AV Area (VTI):     2.31 cm AV Vmax:           143.00 cm/s AV Vmean:          98.100 cm/s AV VTI:            0.262 m AV Peak Grad:      8.2 mmHg AV Mean Grad:      4.0 mmHg LVOT Vmax:         79.40 cm/s LVOT Vmean:        55.400 cm/s LVOT VTI:          0.159 m LVOT/AV VTI ratio: 0.61  AORTA Ao Root diam: 2.70 cm Ao Asc diam:  2.30 cm MITRAL VALVE               TRICUSPID VALVE MV Area (PHT): 2.76 cm    TR Peak grad:   23.8 mmHg MV Decel Time: 275 msec    TR Vmax:        244.00 cm/s MV E velocity: 87.40 cm/s MV A velocity: 37.30 cm/s  SHUNTS MV E/A ratio:  2.34        Systemic VTI:  0.16 m                            Systemic Diam: 2.20 cm Buford Dresser MD Electronically signed by Buford Dresser MD Signature Date/Time: 06/25/2021/3:23:05 PM    Final    US Abdomen Limited RUQ (LIVER/GB)  Result Date: 06/25/2021 CLINICAL DATA:  Liver failure EXAM: ULTRASOUND ABDOMEN LIMITED RIGHT UPPER QUADRANT COMPARISON:  Abdominal CT 03/21/2021 FINDINGS: Gallbladder: Innumerable gallstones which are tiny and shadowing. Circumferential gallbladder wall thickening to 5 mm. No focal tenderness per sonographer exam. No fluid distended gallbladder. Common bile duct: Diameter: 6 mm.  Where visualized, no filling defect. Liver: Multifocal liver metastases with extensive infiltrative involvement of the inferior right lobe where masslike heterogeneity measures up to 15 cm. Modality differences limit comparison with prior CT. Portal vein is patent on color Doppler imaging with normal direction of blood flow towards the  liver. IMPRESSION: 1. Known, extensive hepatic metastatic disease. 2. Innumerable gallstones. Gallbladder wall thickening which is considered reactive as an isolated finding. Electronically Signed   By: Jorje Guild M.D.   On: 06/25/2021 07:26     Assessment and Plan:   Reduction in EF Chronic anemia -Hgb 7.2 yesterday, was 9.1 two weeks ago. No gross signs of bleeding. Wife endorses constipation -Echo with EF 40-45%,  prior normal. I personally read his echo. The abnormal function is moderate hypokinesis of the entire septal wall. He otherwise has mild global hypokinesis -hsTn surprisingly normal. No evidence that this is due to acute coronary syndrome -he was not placed on anticoagulation due to high risk of bleeding. Given this, he is not a candidate for cath, as intervention would require uninterrupted dual antiplatelet therapy. He is not a candidate for redo sternotomy given his comorbidities. He is asymptomatic -on metoprolol as below. No blood pressure room for ACEi/ARB/ARNI/MRA currently; chronic dehydration also a concern with these medications.  Atrial flutter History of paroxysmal atrial fibrillation -RVR on presentation, now rate controlled -chadsvasc=5 -per notes, not started on anticoagulation due to risk of bleeding. He also has increased risk of thrombosis given malignancy and megace.  -he does not have brain metastases that I am aware of (though I also do not see imaging for this), which would be a contraindication for anticoagulation -we discussed together today. After shared decision making,  -continue metoprolol, will change to succinate 100 mg tomorrow -will stop diltiazem given reduced EF, risk of hypotension. No plans for rhythm control at this time given lack of anticoagulation.  CAD s/p prior CABG -on aspirin 81 mg daily, caution given chronic anemia -on atorvastatin, AST/ALT normal  Stage 4 high grade urothelial carcinoma, with mets to liver and bone Failure to  thrive, dehydration -follows with Dr. Alen Blew and palliative care -off chemotherapy currently, trying to gain weight, was receiving regular IV fluid infusions -albumin 1.9, defer to primary team on optimizing nutrition. Was started on megace at recent visit to boost appetite  Overall we had an extensive discussion regarding atrial flutter, rate vs rhythm control, role of anticoagulation, medications (metoprolol vs diltiazem, aspirin vs DOAC).  I feel that this is a deeply personal decision. He is anemic but not having active bleeding; low reserve if he does have a bleed. If we do not use anticoagulation, this will be a rate control strategy. He does have elevated risk for stroke. However, we discussed their own goals for his care. I think if the goal is symptom management and avoiding procedures/optimizing medications, then a rate control strategy without anticoagulation is reasonable.  Overall multiple high risk medical conditions, high complexity medical decision making.  I will reach out to Dr. Sallyanne Kuster to update him but noted that he may not be able to respond for several days.  I will check back tomorrow to see if they have any additional questions.  Risk Assessment/Risk Scores:   CHA2DS2-VASc Score = 5   This indicates a 7.2% annual risk of stroke. The patient's score is based upon: CHF History: 1 HTN History: 1 Diabetes History: 0 Stroke History: 0 Vascular Disease History: 1 Age Score: 2 Gender Score: 0   For questions or updates, please contact [facility] Please consult [email or web address] for contact info under    Signed, Buford Dresser, MD  06/28/2021 11:19 AM

## 2021-06-29 DIAGNOSIS — I10 Essential (primary) hypertension: Secondary | ICD-10-CM | POA: Diagnosis not present

## 2021-06-29 DIAGNOSIS — I2510 Atherosclerotic heart disease of native coronary artery without angina pectoris: Secondary | ICD-10-CM | POA: Diagnosis not present

## 2021-06-29 LAB — COMPREHENSIVE METABOLIC PANEL
ALT: 26 U/L (ref 0–44)
AST: 42 U/L — ABNORMAL HIGH (ref 15–41)
Albumin: 1.8 g/dL — ABNORMAL LOW (ref 3.5–5.0)
Alkaline Phosphatase: 270 U/L — ABNORMAL HIGH (ref 38–126)
Anion gap: 4 — ABNORMAL LOW (ref 5–15)
BUN: 16 mg/dL (ref 8–23)
CO2: 22 mmol/L (ref 22–32)
Calcium: 9.3 mg/dL (ref 8.9–10.3)
Chloride: 105 mmol/L (ref 98–111)
Creatinine, Ser: 0.89 mg/dL (ref 0.61–1.24)
GFR, Estimated: >60 mL/min (ref >60)
Glucose, Bld: 110 mg/dL — ABNORMAL HIGH (ref 70–99)
Potassium: 3.9 mmol/L (ref 3.5–5.1)
Sodium: 131 mmol/L — ABNORMAL LOW (ref 135–145)
Total Bilirubin: 2.3 mg/dL — ABNORMAL HIGH (ref 0.3–1.2)
Total Protein: 5 g/dL — ABNORMAL LOW (ref 6.5–8.1)

## 2021-06-29 LAB — CBC
HCT: 23.4 % — ABNORMAL LOW (ref 39.0–52.0)
Hemoglobin: 7.5 g/dL — ABNORMAL LOW (ref 13.0–17.0)
MCH: 30.2 pg (ref 26.0–34.0)
MCHC: 32.1 g/dL (ref 30.0–36.0)
MCV: 94.4 fL (ref 80.0–100.0)
Platelets: 176 10*3/uL (ref 150–400)
RBC: 2.48 MIL/uL — ABNORMAL LOW (ref 4.22–5.81)
RDW: 18.5 % — ABNORMAL HIGH (ref 11.5–15.5)
WBC: 9.6 10*3/uL (ref 4.0–10.5)
nRBC: 0 % (ref 0.0–0.2)

## 2021-06-29 MED ORDER — BISACODYL 5 MG PO TBEC: 5.0000 mg | DELAYED_RELEASE_TABLET | Freq: Every day | ORAL | Status: DC | PRN

## 2021-06-29 MED ORDER — ENOXAPARIN SODIUM 40 MG/0.4ML IJ SOSY
40.0000 mg | PREFILLED_SYRINGE | INTRAMUSCULAR | Status: DC
  Administered 2021-06-29 – 2021-07-04 (×5): 40 mg via SUBCUTANEOUS
  Filled 2021-06-29 (×6): qty 0.4

## 2021-06-29 NOTE — Progress Notes (Signed)
Progress Note  Patient Name: Lawrence Hall Date of Encounter: 06/29/2021  [facility] Cardiologist: Sanda Klein, MD   Subjective   Slept better overnight. No new concerns today. Reviewed medications, all questions answered.  Inpatient Medications    Scheduled Meds:  aspirin EC  81 mg Oral QHS   atorvastatin  80 mg Oral q1800   Chlorhexidine Gluconate Cloth  6 each Topical Daily   enoxaparin (LOVENOX) injection  40 mg Subcutaneous Q24H   feeding supplement  237 mL Oral TID BM   megestrol  400 mg Oral Daily   metoprolol succinate  100 mg Oral Daily   mirtazapine  15 mg Oral QHS   multivitamin with minerals  1 tablet Oral Daily   polyethylene glycol  17 g Oral BID   sodium chloride flush  10-40 mL Intracatheter Q12H   Continuous Infusions:  PRN Meds: acetaminophen **OR** acetaminophen, bisacodyl, ondansetron **OR** ondansetron (ZOFRAN) IV, oxyCODONE, sodium chloride flush   Vital Signs    Vitals:   06/28/21 2131 06/29/21 0430 06/29/21 0500 06/29/21 1030  BP: 116/69 (!) 141/74 121/64   Pulse:  (!) 52  (!) 104  Resp: '16 18 18   '$ Temp: 98.7 F (37.1 C) 98.7 F (37.1 C)    TempSrc:      SpO2: 99% 98%    Weight:      Height:        Intake/Output Summary (Last 24 hours) at 06/29/2021 1154 Last data filed at 06/29/2021 0805 Gross per 24 hour  Intake 717 ml  Output --  Net 717 ml   Last 3 Weights 06/24/2021 06/24/2021 06/24/2021  Weight (lbs) 145 lb 11.6 oz 150 lb 143 lb 12 oz  Weight (kg) 66.1 kg 68.04 kg 65.205 kg      Telemetry    Atrial flutter with variable conduction - Personally Reviewed  ECG    No new since 06/24/21 - Personally Reviewed  Physical Exam   GEN: Frail, pale appearing, falls asleep easily Neck: No JVD Cardiac: largely regular S1/S2, no murmurs, rubs, or gallops.  Respiratory: Clear to auscultation bilaterally. GI: Soft, nontender, non-distended  MS: No edema; No deformity. Neuro:  Nonfocal  Psych: Normal affect   Labs    High  Sensitivity Troponin:   Recent Labs  Lab 06/24/21 1420  TROPONINIHS 11     Chemistry Recent Labs  Lab 06/24/21 1420 06/25/21 0345 06/27/21 0405 06/28/21 0423 06/29/21 0445  NA 130*   < > 131* 133* 131*  K 3.9   < > 3.8 4.0 3.9  CL 103   < > 106 108 105  CO2 23   < > 20* 20* 22  GLUCOSE 103*   < > 114* 105* 110*  BUN 19   < > '16 15 16  '$ CREATININE 0.87   < > 0.94 0.92 0.89  CALCIUM 9.1   < > 8.8* 9.0 9.3  MG 1.9  --   --   --   --   PROT  --    < > 5.3* 5.1* 5.0*  ALBUMIN  --    < > 2.0* 1.9* 1.8*  AST  --    < > 37 35 42*  ALT  --    < > '28 27 26  '$ ALKPHOS  --    < > 259* 252* 270*  BILITOT  --    < > 2.3* 2.1* 2.3*  GFRNONAA >60   < > >60 >60 >60  ANIONGAP 4*   < >  5 5 4*   < > = values in this interval not displayed.    Lipids No results for input(s): CHOL, TRIG, HDL, LABVLDL, LDLCALC, CHOLHDL in the last 168 hours.  Hematology Recent Labs  Lab 06/27/21 0405 06/28/21 0423 06/29/21 0445  WBC 8.6 9.4 9.6  RBC 2.38* 2.41* 2.48*  HGB 7.2* 7.3* 7.5*  HCT 22.7* 22.8* 23.4*  MCV 95.4 94.6 94.4  MCH 30.3 30.3 30.2  MCHC 31.7 32.0 32.1  RDW 19.1* 18.8* 18.5*  PLT 180 180 176   Thyroid No results for input(s): TSH, FREET4 in the last 168 hours.  BNP Recent Labs  Lab 06/24/21 1425  BNP 341.4*    DDimer No results for input(s): DDIMER in the last 168 hours.   Radiology    CT HEAD WO CONTRAST (5MM)  Result Date: 06/28/2021 CLINICAL DATA:  Mental status change. Unknown cause. Metastases suspected. Renal carcinoma. EXAM: CT HEAD WITHOUT CONTRAST TECHNIQUE: Contiguous axial images were obtained from the base of the skull through the vertex without intravenous contrast. RADIATION DOSE REDUCTION: This exam was performed according to the departmental dose-optimization program which includes automated exposure control, adjustment of the mA and/or kV according to patient size and/or use of iterative reconstruction technique. COMPARISON:  None. BRAIN: BRAIN Patchy and  confluent areas of decreased attenuation are noted throughout the deep and periventricular white matter of the cerebral hemispheres bilaterally, compatible with chronic microvascular ischemic disease. No evidence of large-territorial acute infarction. No parenchymal hemorrhage. No mass lesion. No extra-axial collection. No mass effect or midline shift. No hydrocephalus. Basilar cisterns are patent. Vascular: No hyperdense vessel. Skull: No acute fracture or focal lesion. Sinuses/Orbits: Paranasal sinuses and mastoid air cells are clear. Bilateral lens replacement. Otherwise orbits are unremarkable. Other: None. IMPRESSION: No acute intracranial abnormality. Please note CT is not sensitive for metastases such as MRI. Electronically Signed   By: Iven Finn M.D.   On: 06/28/2021 18:41    Cardiac Studies   Echo 06/25/21  1. Mild global hypokinesis with focal moderate hypokinesis in the entire  septal wall. Left ventricular ejection fraction, by estimation, is 40 to  45%. The left ventricle has mildly decreased function. The left ventricle  demonstrates regional wall motion   abnormalities (see scoring diagram/findings for description). There is  mild concentric left ventricular hypertrophy. Left ventricular diastolic  parameters are indeterminate. There is hypokinesis of the left  ventricular, entire septal wall.   2. Right ventricular systolic function is normal. The right ventricular  size is normal. There is normal pulmonary artery systolic pressure.   3. Left atrial size was moderately dilated.   4. Right atrial size was mildly dilated.   5. The mitral valve is abnormal. Mild mitral valve regurgitation. No  evidence of mitral stenosis.   6. The aortic valve has been repaired/replaced. Aortic valve  regurgitation is not visualized. There is a 25 mm bioprosthetic valve  present in the aortic position. Procedure Date: 04/29/2018. Echo findings  are consistent with normal structure and function    of the aortic valve prosthesis.   7. The inferior vena cava is normal in size with greater than 50%  respiratory variability, suggesting right atrial pressure of 3 mmHg.   Comparison(s): Prior images reviewed side by side. Changes from prior  study are noted.   Conclusion(s)/Recommendation(s): Mild global hypokinesis with focal  moderate hypokinesis in the entire septal wall. Left ventricular ejection  fraction, by estimation, is 40 to 45%. This is a change from prior study.  Patient Profile     76 y.o. male with a hx of stage 4 urothelial carcinoma, hypertension, paroxysmal atrial fibrillation, CAD with history of CABG, bioprosthetic AVR who is being seen for the evaluation of atrial flutter at the request of Dr. Wyline Copas.  Assessment & Plan    Reduction in EF Chronic anemia -Hgb 7.5 today, was 9.1 two weeks ago. No gross signs of bleeding. Wife endorses constipation -Echo with EF 40-45%, prior normal. I personally read his echo. The abnormal function is moderate hypokinesis of the entire septal wall. He otherwise has mild global hypokinesis -hsTn surprisingly normal. No evidence that this is due to acute coronary syndrome -given that he is asymptomatic, not a cath/sternotomy candidate, and no ACS, no plans for further evaluation of EF -on metoprolol as below. No blood pressure room for ACEi/ARB/ARNI/MRA currently; chronic dehydration also a concern with these medications.   Atrial flutter History of paroxysmal atrial fibrillation -RVR on presentation, now rate controlled -chadsvasc=5 -see conversation yesterday re: risk/benefit. I did reach out to Dr. Sallyanne Kuster at their request, who agrees with no anticoagulation -continue metoprolol succinate 100 mg (discussed with wife that she can do 50 mg BID if she is more comfortable with that) -stopped diltiazem given reduced EF, risk of hypotension.  -No plans for rhythm control at this time given lack of anticoagulation.   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 Alen Blew and palliative care -off chemotherapy currently, trying to gain weight, was receiving regular IV fluid infusions -albumin 1.9, defer to primary team on optimizing nutrition. Was started on megace at recent visit to boost appetite   Overall multiple high risk medical conditions, high complexity medical decision making. [facility] will sign off.   Medication Recommendations:  only change to cardiac medication is increase in metoprolol succinate to 100 mg daily. Also ok as metoprolol succinate 50 mg BID (if patient's wife prefers) Other recommendations (labs, testing, etc):  none Follow up as an outpatient:  we will arrange for outpatient follow up with Dr. Sallyanne Kuster or a member of his team  For questions or updates, please contact [facility] Please consult [email or web address] for contact info under        Signed, Buford Dresser, MD  06/29/2021, 11:54 AM

## 2021-06-29 NOTE — Progress Notes (Signed)
?Progress Note ? ? ?Patient: Lawrence Hall HYQ:[id number] DOB: 08-Aug-1945 DOA: 06/24/2021     5 ?DOS: the patient was seen and examined on 06/29/2021 ?  ?Brief hospital course: ?76 y.o. 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 Hypertension, dyslipidemia, paroxysmal atrial fibrillation, coronary artery disease sp CABG, and bioprosthetic aortic valve replacement.  ?  ?Patient has been very weak and deconditioned, poor mobility due to generalized weakness, poor balance and failure to thrive. He has been treated with weekly IV fluids for hydration per Dr Alen Blew as part of palliative care.  ?Today before getting to the infusion center he felt palpitations, moderate in intensity, associated with dyspnea but not chest pain, no improving or worsening factors. ?At the infusion center he was noted to have a rapid heart rate with irregular rhythm and he was transferred to the ED for further evaluation.  ? ?Assessment and Plan: ?* Atrial flutter ([place]) ?Patient was initially placed on diltiazem for rate control, later transitioned to PO cardizem with metoprolol '50mg'$  bid ? ?Patient with advanced cancer, now under palliative care. ?There were concerns of increased risk of bleeding thus held on anticoagulation. CHADS of 5 ? ?Given concerns of dehydration, was given IVF ? ?His echocardiogram from 2020 had a preserved LV systolic function with no significant valvular disease. ? ?Repeat 2d echo with EF 40-45%, which is lower than previous. Pt also with wall motion noted.  ? ?[facility] input. Pt now on metoprolol '100mg'$  with cardizem d/c'd ? ?Likely goal here is rate control without anticoagulation, knowing pt has increased risk for stroke down the road. Overall difficult situation ? ?Hyponatremia ?Na is 130 at presentation ?Pt reports limited PO intake leading up to admit ?Na currently 131 ?Recheck  bmet in AM ? ?Hyperbilirubinemia ?Patient with clinical icterus and elevated total bilirubin. ?Likely worsening metastatic disease. ? ?Acute on chronic liver failure.  ?Plan to check liver US with dopplers and follow up liver function in am.  ?He has chronic elevation of AST and ALT.  ?Ammonia levels normal ? ?Hyperlipidemia ?Continue with statin therapy  ? ?Essential hypertension ?Continue blood pressure monitoring. ?BP stable currently on metoprolol ? ?CAD (coronary artery disease), native coronary artery ?Pt is s/p CABG in 1/20 ?No chest pain.  ?Continue blood pressure monitoring.  ?Echo reviewed, with reduced EF of 40-45% with wall motion abnormality ?[facility] input. Not candidate for invasive work up. Medical management planned ? ?Given worsening heart function not amenable to further invasive work up in setting of malignancy, have consulted Palliative Care for goals of care ? ?Malignant tumor of renal pelvis, left (HCC) ?Renal pelvis tumor in 2021, then developed stage IV high grade urothelial carcinoma with hepatic as well as bone metastasis.  ? ?Patient currently under palliative care. ?Getting IV fluids once a week as outpatient.  ? ? ?Depression ?Remeron now on hold secondary to increased lethargy ? ?Chronic anemia ?Cell count with hgb stable at 7.3 ?Anemia is associated to malignancy.  ?Cont to follow CBC trends ? ?Orthostatic hypotension ?Recently noted to have BP rapidly dropping on standing ?Pt clinically dehydrated with limited PO intake ?Improved with IVF hydration ? ? ?  ? ?Subjective: Feeling tired this AM ? ?Physical Exam: ?Vitals:  ? 06/29/21 0500 06/29/21 1030 06/29/21 1310 06/29/21 1428  ?BP: 121/64  102/74   ?Pulse:  (!) 104 (!) 105   ?Resp: 18     ?Temp:  98.5 ?F (36.9 ?C)  ?TempSrc:    Oral  ?SpO2:      ?Weight:      ?Height:      ? ?General exam: Awake, laying in bed, in nad ?Respiratory system: Normal respiratory effort, no wheezing ?Cardiovascular system: regular rate, s1,  s2 ?Gastrointestinal system: Soft, nondistended, positive BS ?Central nervous system: CN2-12 grossly intact, strength intact ?Extremities: Perfused, no clubbing ?Skin: Normal skin turgor, no notable skin lesions seen ?Psychiatry: Mood normal // no visual hallucinations   ? ?Data Reviewed: ? ?Na 133 ? ?Family Communication: Pt in room, wife at bedside ? ?Disposition: ?Status is: Inpatient ?Remains inpatient appropriate because: Severity of illness ? Planned Discharge Destination: Home with Home Health ? ? ? ?Author: ?Marylu Lund, MD ?06/29/2021 3:52 PM ? ?For on call review [email or web address]  ?

## 2021-06-29 NOTE — TOC Progression Note (Signed)
Transition of Care (TOC) - Progression Note  ? ? ?Patient Details  ?Name: DEEP BONAWITZ ?MRN: [id number] ?Date of Birth: 12/27/1945 ? ?Transition of Care (TOC) CM/SW Contact  ?Tawanna Cooler, RN ?Phone Number: ?06/29/2021, 10:12 AM ? ?Clinical Narrative:    ? ?TOC reviewing older charts.  Patient was denied for SNF by his insurance [facility]) during his hospital stay last November.  TOC team then set up patient to go home with [facility].  Spoke with hospice rep who states patient never wanted to set up a visit, and after several attempts, they closed out the referral.   ? ?Patient may be approved for home health by [facility], probably will still not approve SNF.     ? ?  ?  ?  ?Readmission Risk Interventions ?Readmission Risk Prevention Plan 02/22/2021  ?Transportation Screening Complete  ?Medication Review Press photographer) Complete  ?PCP or Specialist appointment within 3-5 days of discharge Complete  ?[place] or Home Care Consult Complete  ?SW Recovery Care/Counseling Consult Complete  ?Palliative Care Screening Complete  ?[place] Not Applicable  ?Some recent data might be hidden  ? ? ?

## 2021-06-30 ENCOUNTER — Inpatient Hospital Stay (HOSPITAL_COMMUNITY): Payer: PPO

## 2021-06-30 ENCOUNTER — Telehealth: Payer: Self-pay

## 2021-06-30 DIAGNOSIS — Z515 Encounter for palliative care: Secondary | ICD-10-CM

## 2021-06-30 DIAGNOSIS — J189 Pneumonia, unspecified organism: Secondary | ICD-10-CM

## 2021-06-30 DIAGNOSIS — I4892 Unspecified atrial flutter: Secondary | ICD-10-CM

## 2021-06-30 DIAGNOSIS — I4891 Unspecified atrial fibrillation: Secondary | ICD-10-CM | POA: Diagnosis not present

## 2021-06-30 DIAGNOSIS — Z7189 Other specified counseling: Secondary | ICD-10-CM | POA: Diagnosis not present

## 2021-06-30 DIAGNOSIS — D649 Anemia, unspecified: Secondary | ICD-10-CM | POA: Diagnosis not present

## 2021-06-30 DIAGNOSIS — C689 Malignant neoplasm of urinary organ, unspecified: Secondary | ICD-10-CM

## 2021-06-30 LAB — COMPREHENSIVE METABOLIC PANEL
ALT: 27 U/L (ref 0–44)
AST: 44 U/L — ABNORMAL HIGH (ref 15–41)
Albumin: 1.8 g/dL — ABNORMAL LOW (ref 3.5–5.0)
Alkaline Phosphatase: 250 U/L — ABNORMAL HIGH (ref 38–126)
Anion gap: 6 (ref 5–15)
BUN: 16 mg/dL (ref 8–23)
CO2: 22 mmol/L (ref 22–32)
Calcium: 9 mg/dL (ref 8.9–10.3)
Chloride: 102 mmol/L (ref 98–111)
Creatinine, Ser: 1.05 mg/dL (ref 0.61–1.24)
GFR, Estimated: >60 mL/min (ref >60)
Glucose, Bld: 95 mg/dL (ref 70–99)
Potassium: 3.9 mmol/L (ref 3.5–5.1)
Sodium: 130 mmol/L — ABNORMAL LOW (ref 135–145)
Total Bilirubin: 1.8 mg/dL — ABNORMAL HIGH (ref 0.3–1.2)
Total Protein: 5.1 g/dL — ABNORMAL LOW (ref 6.5–8.1)

## 2021-06-30 LAB — OSMOLALITY: Osmolality: 275 mOsm/kg (ref 275–295)

## 2021-06-30 MED ORDER — LABETALOL HCL 5 MG/ML IV SOLN
5.0000 mg | INTRAVENOUS | Status: DC | PRN
  Administered 2021-07-04: 5 mg via INTRAVENOUS
  Filled 2021-06-30: qty 4

## 2021-06-30 MED ORDER — SODIUM CHLORIDE 0.9 % IV SOLN
2.0000 g | INTRAVENOUS | Status: AC
  Administered 2021-06-30 – 2021-07-04 (×5): 2 g via INTRAVENOUS
  Filled 2021-06-30 (×5): qty 20

## 2021-06-30 MED ORDER — SODIUM CHLORIDE 0.9 % IV SOLN
500.0000 mg | INTRAVENOUS | Status: AC
  Administered 2021-06-30 – 2021-07-04 (×5): 500 mg via INTRAVENOUS
  Filled 2021-06-30 (×5): qty 5

## 2021-06-30 NOTE — Progress Notes (Signed)
[employer] [facility]) Hospital Liaison Note: ? ?Patient spouse Neoma Laming) reached out to [facility] referral center this morning to request information on our services. Made TOC aware that we would be making an educational visit this afternoon with patient about [facility] Palliative and Hospice services. Visited with patient in room with spouse at side, patient stated that he was not interested in hospice or palliative services at this time so left information and contact numbers for [facility] at bedside.  ? ?Please feel free to reach out to [facility] for hospice related support as needed, ? ?Gar Ponto, RN ?[facility] HLT ?[phone] ?

## 2021-06-30 NOTE — Progress Notes (Signed)
?Progress Note ? ? ?Patient: Lawrence Hall JSE:[id number] DOB: [birth year] DOA: 06/24/2021     6 ?DOS: the patient was seen and examined on 06/30/2021 ?  ?Brief hospital course: ?76 y.o. 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 Hypertension, dyslipidemia, paroxysmal atrial fibrillation, coronary artery disease sp CABG, and bioprosthetic aortic valve replacement.  ?  ?Patient has been very weak and deconditioned, poor mobility due to generalized weakness, poor balance and failure to thrive. He has been treated with weekly IV fluids for hydration per Dr Alen Blew as part of palliative care.  ?Today before getting to the infusion center he felt palpitations, moderate in intensity, associated with dyspnea but not chest pain, no improving or worsening factors. ?At the infusion center he was noted to have a rapid heart rate with irregular rhythm and he was transferred to the ED for further evaluation.  ? ?Assessment and Plan: ?* Atrial flutter ([place]) ?Patient was initially placed on diltiazem for rate control, later transitioned to PO cardizem with metoprolol '50mg'$  bid ? ?Patient with advanced cancer, now under palliative care. ?There were concerns of increased risk of bleeding thus held on anticoagulation. CHADS of 5 ? ?Given concerns of dehydration, was given IVF ? ?His echocardiogram from 2020 had a preserved LV systolic function with no significant valvular disease. ? ?Repeat 2d echo with EF 40-45%, which is lower than previous. Pt also with wall motion noted.  ? ?[facility] input. Pt now on metoprolol '100mg'$  with cardizem d/c'd ? ?Likely goal here is rate control without anticoagulation, knowing pt has increased risk for stroke down the road. Overall difficult situation ? ?Cardiology has since signed off ? ?Hyponatremia ?Na is 130 at presentation ?Pt reports limited PO intake leading up to  admit ?Na currently 130 ?Will check urine sodium, urine osm, serum osm ? ?Hyperbilirubinemia ?Patient with clinical icterus and elevated total bilirubin. ?Likely worsening metastatic disease. ? ?Acute on chronic liver failure.  ?Plan to check liver US with dopplers and follow up liver function in am.  ?He has chronic elevation of AST and ALT.  ?Ammonia levels normal ? ?Hyperlipidemia ?Continue with statin therapy  ? ?Essential hypertension ?Continue blood pressure monitoring. ?BP stable currently on metoprolol ? ?CAD (coronary artery disease), native coronary artery ?Pt is s/p CABG in 1/20 ?No chest pain.  ?Continue blood pressure monitoring.  ?Echo reviewed, with reduced EF of 40-45% with wall motion abnormality ?[facility] input. Not candidate for invasive work up. Medical management planned ? ?Given worsening heart function not amenable to further invasive work up in setting of malignancy, have consulted Palliative Care for goals of care ? ?Malignant tumor of renal pelvis, left (HCC) ?Renal pelvis tumor in 2021, then developed stage IV high grade urothelial carcinoma with hepatic as well as bone metastasis.  ? ?Patient was referred to palliative care as outpatient. Per Pt's wife, pt had declined outpt palliaitve ?Getting IV fluids once a week as outpatient.  ? ? ?Depression ?Now more awake since holding remeron ? ?Chronic anemia ?Cell count had been stable ?Anemia is associated to malignancy.  ?Cont to follow CBC trends ? ?Pneumonia ?Febrile this AM to 38.1 ?CXR ordered and reviewed. Findings of B basilar opacities suggestive of PNA ?Have started azithromycin and rocephin ?Will check blood cx and UA ? ?Orthostatic hypotension ?Recently noted to have BP rapidly dropping on standing ?Pt clinically dehydrated with limited PO intake ?Improved with IVF hydration ? ? ?  ? ?  Subjective:More awake today. Had fever this AM ? ?Physical Exam: ?Vitals:  ? 06/29/21 2234 06/30/21 0400 06/30/21 0835 06/30/21 1024   ?BP: 114/66 119/78 116/76   ?Pulse: 71 (!) 131 (!) 119 65  ?Resp: (!) '22 20 20 20  '$ ?Temp: 99.2 ?F (37.3 ?C) 97.7 ?F (36.5 ?C) (!) 100.5 ?F (38.1 ?C) 97.7 ?F (36.5 ?C)  ?TempSrc: Oral Oral Axillary Oral  ?SpO2: 98%  97%   ?Weight:      ?Height:      ? ?General exam: Conversant, in no acute distress ?Respiratory system: normal chest rise, clear, no audible wheezing ?Cardiovascular system: regular rhythm, s1-s2 ?Gastrointestinal system: Nondistended, nontender, pos BS ?Central nervous system: No seizures, no tremors ?Extremities: No cyanosis, no joint deformities ?Skin: No rashes, no pallor ?Psychiatry: Affect normal // no auditory hallucinations  ? ?Data Reviewed: ? ?Na 130 ? ?Family Communication: Pt in room, wife at bedside ? ?Disposition: ?Status is: Inpatient ?Remains inpatient appropriate because: Severity of illness ? Planned Discharge Destination: Home with Home Health ? ? ? ?Author: ?Marylu Lund, MD ?06/30/2021 11:26 AM ? ?For on call review [email or web address]  ?

## 2021-06-30 NOTE — Assessment & Plan Note (Signed)
Febrile this AM to 38.1 CXR ordered and reviewed. Findings of B basilar opacities suggestive of PNA Have started azithromycin and rocephin Will check blood cx and UA

## 2021-06-30 NOTE — Progress Notes (Signed)
MD notified of vitals, tylenol given and orders received. ? ?

## 2021-06-30 NOTE — Telephone Encounter (Signed)
Patient currently admitted- will attempt [facility] call tomorrow 03/13 ?

## 2021-06-30 NOTE — TOC Progression Note (Signed)
Transition of Care (TOC) - Progression Note  ? ? ?Patient Details  ?Name: Lawrence Hall ?MRN: [id number] ?Date of Birth: [birth year] ? ?Transition of Care (TOC) CM/SW Contact  ?Dessa Phi, RN ?Phone Number: ?06/30/2021, 11:05 AM ? ?Clinical Narrative:  Await palliative cons.  ? ? ? ?  ?  ? ?Expected Discharge Plan and Services ?  ?  ?  ?  ?  ?                ?  ?  ?  ?  ?  ?  ?  ?  ?  ?  ? ? ?Social Determinants of Health (SDOH) Interventions ?  ? ?Readmission Risk Interventions ?Readmission Risk Prevention Plan 02/22/2021  ?Transportation Screening Complete  ?Medication Review Press photographer) Complete  ?PCP or Specialist appointment within 3-5 days of discharge Complete  ?[place] or Home Care Consult Complete  ?SW Recovery Care/Counseling Consult Complete  ?Palliative Care Screening Complete  ?[place] Not Applicable  ?Some recent data might be hidden  ? ? ?

## 2021-06-30 NOTE — Telephone Encounter (Signed)
-----   Message from Abigail Butts, PA-C sent at 06/29/2021 12:35 PM EDT ----- ?Regarding: Hospital follow-up ?Hi there! Please place a TOC call for his upcoming hospital follow-up visit with Dr. Sallyanne Kuster 07/11/21. Thank you! ? ?

## 2021-06-30 NOTE — Consult Note (Cosign Needed)
Consultation Note Date: 06/30/2021   Patient Name: Lawrence Hall  DOB: Mar 25, 1946  MRN: [id number]  Age / Sex: 76 y.o., male  PCP: [name], MD Referring Physician: Donne Hazel, MD  Reason for Consultation: Establishing goals of care  HPI/Patient Profile: 76 y.o. male  with past medical history of stage IV urothelial cancer mets to liver and bone (no current treatment), renal cell cancer 2021, hypertension, paroxysmal atrial fibrillation, [name], bioprosthetic aortic placement admitted from cancer center infusion center on 06/24/2021 with palpitations and dyspnea related to atrial fibrillation RVR with dehydration.   Clinical Assessment and Goals of Care: I met today at Lawrence Hall's bedside along with wife, [name]. Lawrence Hall was sleeping throughout most of my visit but opened eyes on occasion. He does not contribute much to conversation but is listening. [name] shares that they were previously been given information for outpatient palliative care for [facility] but Lawrence Hall did not want to have visit from them yet. She does admit he has progression and is clear on her desire to do everything to help them have more quality time together. She would like for her husband to be surrounded by positive and encouraging people to gently assist him in rehabilitation. He would like to return home but [name] notes that he needs to be able to ambulate. I introduced the idea that his physical body may not allow him to rehab and ambulate at some stage regardless of how much he may desire to be more active. [name] is hopeful that he can gain some strength and Lawrence Hall expresses desire to return to his home.   He has been going every Tuesday to receive IV infusions at cancer center to stay hydrated. I am concerned that they will not be able to physically get him to center to have infusions much longer and this will lead  to further decline (I did not discuss this with them today). They are clear on goals for no desire of any further chemotherapy. They understand that his disease is progressing but are hopeful to maintain what they can for as long as they can. Lawrence Hall expresses that his main concern is for his wife and she just wants them to be able to enjoy their time together as much as possible.   Agree to take one day at a time and will continue conversations.   Primary Decision Maker PATIENT    SUMMARY OF RECOMMENDATIONS   - Ongoing support and conversations recommended  Code Status/Advance Care Planning: Full code - did not discuss today. His Living Will is very specific when I reviewed.    Symptom Management:  Per attending.   Palliative Prophylaxis:  Aspiration, Bowel Regimen, Delirium Protocol, Frequent Pain Assessment, and Turn Reposition  Prognosis:  Overall prognosis very poor.   Discharge Planning: To Be Determined      Primary Diagnoses: Present on Admission:  Atrial flutter ([place])  Hyperlipidemia  Essential hypertension  CAD (coronary artery disease), native coronary artery  Malignant tumor of  renal pelvis, left (HCC)  Chronic anemia  Depression  Hyponatremia  Hyperbilirubinemia   I have reviewed the medical record, interviewed the patient and family, and examined the patient. The following aspects are pertinent.  Past Medical History:  Diagnosis Date   Arthritis    mild hands   Asthma    pt denies 4/21    Chest pain 07/25/2012   Coronary artery disease    Dysrhythmia    PAC'S, hx of atrial fib after OHS 04/2018   ED (erectile dysfunction)    H/O hiatal hernia    Heart murmur    Hypercholesterolemia    Hypertension    MARGINALLY HIGH - NO MEDS -MONITORS   Metastatic cancer to bone ([place]) dx'd 07/2020   rt humerus   Myocardial infarction ([place])    2017    Pneumonia    hx of 2018    PONV (postoperative nausea and vomiting)    Prostate cancer ([place]) 03/27/2011    prostate bx=adenocarcinoma,   Skin cancer 2008   basal cell face /removed   Sleep apnea    borderline sleep apnea per patient no devices    Urothelial cancer ([place]) dx'd 05/2019   left renal ca   Social History   Socioeconomic History   Marital status: Married    Spouse name: [name]    Number of children: 0   Years of education: 15 years    Highest education level: Not on file  Occupational History    Employer: [employer]    Comment: customer service [employer]  Tobacco Use   Smoking status: Never   Smokeless tobacco: Never  Vaping Use   Vaping Use: Never used  Substance and Sexual Activity   Alcohol use: Not Currently   Drug use: No   Sexual activity: Not on file  Other Topics Concern   Not on file  Social History Narrative   Not on file   Social Determinants of Health   Financial Resource Strain: Low Risk    Difficulty of Paying Living Expenses: Not hard at all  Food Insecurity: Not on file  Transportation Needs: No Transportation Needs   Lack of Transportation (Medical): No   Lack of Transportation (Non-Medical): No  Physical Activity: Insufficiently Active   Days of Exercise per Week: 1 day   Minutes of Exercise per Session: 10 min  Stress: Stress Concern Present   Feeling of Stress : To some extent  Social Connections: Not on file   Family History  Problem Relation Age of Onset   Prostate cancer Father 27       in his 70's/other comorbidities/79 deceased   Alcohol abuse Father    Liver disease Father    Heart attack Father 56   Breast cancer Mother 44       mets to bone,deceased age 68   Cervical cancer Sister    Cervical cancer Sister    Breast cancer Sister    CAD Brother        Died of sudden cardiac death/MI   Scheduled Meds:  aspirin EC  81 mg Oral QHS   atorvastatin  80 mg Oral q1800   Chlorhexidine Gluconate Cloth  6 each Topical Daily   enoxaparin (LOVENOX) injection  40 mg Subcutaneous Q24H   feeding supplement  237 mL Oral TID BM    megestrol  400 mg Oral Daily   metoprolol succinate  100 mg Oral Daily   mirtazapine  15 mg Oral QHS   multivitamin with  minerals  1 tablet Oral Daily   polyethylene glycol  17 g Oral BID   sodium chloride flush  10-40 mL Intracatheter Q12H   Continuous Infusions:  azithromycin 500 mg (06/30/21 1421)   cefTRIAXone (ROCEPHIN)  IV 2 g (06/30/21 1244)   PRN Meds:.acetaminophen **OR** acetaminophen, bisacodyl, labetalol, ondansetron **OR** ondansetron (ZOFRAN) IV, oxyCODONE, sodium chloride flush Allergies  Allergen Reactions   Other Itching and Other (See Comments)    Pet dander, seasonal allergies = Runny nose, itchy eyes   Review of Systems  Constitutional:  Positive for activity change and fatigue.  Neurological:  Positive for weakness.   Physical Exam Vitals and nursing note reviewed.  Constitutional:      General: He is not in acute distress.    Appearance: He is ill-appearing.  Cardiovascular:     Rate and Rhythm: Normal rate. Rhythm irregularly irregular.  Pulmonary:     Effort: No tachypnea, accessory muscle usage or respiratory distress.  Abdominal:     Palpations: Abdomen is soft.  Neurological:     Mental Status: He is alert and oriented to person, place, and time.     Comments: Sleepy but oriented    Vital Signs: BP 116/76 (BP Location: Left Arm)    Pulse 65    Temp 97.7 F (36.5 C) (Oral)    Resp 20    Ht 6' (1.829 m)    Wt 66.1 kg    SpO2 97%    BMI 19.76 kg/m  Pain Scale: 0-10 POSS *See Group Information*: S-Acceptable,Sleep, easy to arouse Pain Score: Asleep (for fever)   SpO2: SpO2: 97 % O2 Device:SpO2: 97 % O2 Flow Rate: .   IO: Intake/output summary:  Intake/Output Summary (Last 24 hours) at 06/30/2021 1651 Last data filed at 06/30/2021 1000 Gross per 24 hour  Intake 480 ml  Output --  Net 480 ml    LBM: Last BM Date : 06/26/21 Baseline Weight: Weight: 68 kg Most recent weight: Weight: 66.1 kg     Palliative Assessment/Data:     Time  Total: 75 min  Greater than 50%  of this time was spent counseling and coordinating care related to the above assessment and plan.  Signed by: Vinie Sill, NP Palliative Medicine Team Pager # [phone] (M-F 8a-5p) Team Phone # [phone] (Nights/Weekends)

## 2021-07-01 ENCOUNTER — Inpatient Hospital Stay: Payer: PPO

## 2021-07-01 ENCOUNTER — Inpatient Hospital Stay: Payer: PPO | Admitting: Oncology

## 2021-07-01 DIAGNOSIS — L8990 Pressure ulcer of unspecified site, unspecified stage: Secondary | ICD-10-CM | POA: Insufficient documentation

## 2021-07-01 DIAGNOSIS — Z515 Encounter for palliative care: Secondary | ICD-10-CM | POA: Diagnosis not present

## 2021-07-01 DIAGNOSIS — R627 Adult failure to thrive: Secondary | ICD-10-CM | POA: Diagnosis not present

## 2021-07-01 DIAGNOSIS — Z7189 Other specified counseling: Secondary | ICD-10-CM | POA: Diagnosis not present

## 2021-07-01 LAB — COMPREHENSIVE METABOLIC PANEL
ALT: 34 U/L (ref 0–44)
AST: 55 U/L — ABNORMAL HIGH (ref 15–41)
Albumin: 1.7 g/dL — ABNORMAL LOW (ref 3.5–5.0)
Alkaline Phosphatase: 260 U/L — ABNORMAL HIGH (ref 38–126)
Anion gap: 7 (ref 5–15)
BUN: 17 mg/dL (ref 8–23)
CO2: 22 mmol/L (ref 22–32)
Calcium: 9.3 mg/dL (ref 8.9–10.3)
Chloride: 102 mmol/L (ref 98–111)
Creatinine, Ser: 1.07 mg/dL (ref 0.61–1.24)
GFR, Estimated: >60 mL/min (ref >60)
Glucose, Bld: 81 mg/dL (ref 70–99)
Potassium: 4 mmol/L (ref 3.5–5.1)
Sodium: 131 mmol/L — ABNORMAL LOW (ref 135–145)
Total Bilirubin: 2 mg/dL — ABNORMAL HIGH (ref 0.3–1.2)
Total Protein: 5.1 g/dL — ABNORMAL LOW (ref 6.5–8.1)

## 2021-07-01 LAB — BLOOD CULTURE ID PANEL (REFLEXED) - BCID2

## 2021-07-01 LAB — URINALYSIS, ROUTINE W REFLEX MICROSCOPIC
Bilirubin Urine: NEGATIVE
Glucose, UA: NEGATIVE mg/dL
Ketones, ur: NEGATIVE mg/dL
Leukocytes,Ua: NEGATIVE
Nitrite: NEGATIVE
Protein, ur: NEGATIVE mg/dL
Specific Gravity, Urine: 1.012 (ref 1.005–1.030)
pH: 5 (ref 5.0–8.0)

## 2021-07-01 LAB — CBC
HCT: 22.2 % — ABNORMAL LOW (ref 39.0–52.0)
Hemoglobin: 7.1 g/dL — ABNORMAL LOW (ref 13.0–17.0)
MCH: 30.2 pg (ref 26.0–34.0)
MCHC: 32 g/dL (ref 30.0–36.0)
MCV: 94.5 fL (ref 80.0–100.0)
Platelets: 169 10*3/uL (ref 150–400)
RBC: 2.35 MIL/uL — ABNORMAL LOW (ref 4.22–5.81)
RDW: 18.1 % — ABNORMAL HIGH (ref 11.5–15.5)
WBC: 9.7 10*3/uL (ref 4.0–10.5)
nRBC: 0 % (ref 0.0–0.2)

## 2021-07-01 LAB — SODIUM, URINE, RANDOM: Sodium, Ur: 41 mmol/L

## 2021-07-01 LAB — MAGNESIUM: Magnesium: 1.9 mg/dL (ref 1.7–2.4)

## 2021-07-01 LAB — OSMOLALITY, URINE: Osmolality, Ur: 359 mOsm/kg (ref 300–900)

## 2021-07-01 NOTE — Progress Notes (Signed)
PHARMACY - PHYSICIAN COMMUNICATION ?CRITICAL VALUE ALERT - BLOOD CULTURE IDENTIFICATION (BCID) ? ?Lawrence Hall is an 76 y.o. male who presented to [facility] on 06/24/2021 with a chief complaint of palpitations and dyspnea related to Afib/RVR and dehydration. Treating for CAP. ? ?Assessment:  1/4 BCx bottles growing methicillin-sensitive S. epidermidis (likely contaminant ? ?Name of physician (or Provider) ContactedWyline Copas ? ?Current antibiotics: Azithromycin, Rocephin ? ?Changes to prescribed antibiotics recommended:  ?Patient is on recommended antibiotics - No changes needed ? ?Results for orders placed or performed during the hospital encounter of 06/24/21  ?Blood Culture ID Panel (Reflexed) (Collected: 06/30/2021  9:43 AM)  ?Result Value Ref Range  ? Enterococcus faecalis NOT DETECTED NOT DETECTED  ? Enterococcus Faecium NOT DETECTED NOT DETECTED  ? Listeria monocytogenes NOT DETECTED NOT DETECTED  ? Staphylococcus species DETECTED (A) NOT DETECTED  ? Staphylococcus aureus (BCID) NOT DETECTED NOT DETECTED  ? Staphylococcus epidermidis DETECTED (A) NOT DETECTED  ? Staphylococcus lugdunensis NOT DETECTED NOT DETECTED  ? Streptococcus species NOT DETECTED NOT DETECTED  ? Streptococcus agalactiae NOT DETECTED NOT DETECTED  ? Streptococcus pneumoniae NOT DETECTED NOT DETECTED  ? Streptococcus pyogenes NOT DETECTED NOT DETECTED  ? A.calcoaceticus-baumannii NOT DETECTED NOT DETECTED  ? Bacteroides fragilis NOT DETECTED NOT DETECTED  ? Enterobacterales NOT DETECTED NOT DETECTED  ? Enterobacter cloacae complex NOT DETECTED NOT DETECTED  ? Escherichia coli NOT DETECTED NOT DETECTED  ? Klebsiella aerogenes NOT DETECTED NOT DETECTED  ? Klebsiella oxytoca NOT DETECTED NOT DETECTED  ? Klebsiella pneumoniae NOT DETECTED NOT DETECTED  ? Proteus species NOT DETECTED NOT DETECTED  ? Salmonella species NOT DETECTED NOT DETECTED  ? Serratia marcescens NOT DETECTED NOT DETECTED  ? Haemophilus influenzae NOT DETECTED NOT DETECTED  ?  Neisseria meningitidis NOT DETECTED NOT DETECTED  ? Pseudomonas aeruginosa NOT DETECTED NOT DETECTED  ? Stenotrophomonas maltophilia NOT DETECTED NOT DETECTED  ? Candida albicans NOT DETECTED NOT DETECTED  ? Candida auris NOT DETECTED NOT DETECTED  ? Candida glabrata NOT DETECTED NOT DETECTED  ? Candida krusei NOT DETECTED NOT DETECTED  ? Candida parapsilosis NOT DETECTED NOT DETECTED  ? Candida tropicalis NOT DETECTED NOT DETECTED  ? Cryptococcus neoformans/gattii NOT DETECTED NOT DETECTED  ? Methicillin resistance mecA/C NOT DETECTED NOT DETECTED  ? ? ?Arshi Duarte A ?07/01/2021  5:32 PM ? ?

## 2021-07-01 NOTE — Progress Notes (Signed)
Physical Therapy Treatment ?Patient Details ?Name: Lawrence Hall ?MRN: [id number] ?DOB: [birth year] ?Today's Date: 07/01/2021 ? ? ?History of Present Illness ALEXEY RHOADS is a 76 y.o. male who has been very weak and deconditioned, poor mobility due to generalized weakness, poor balance and failure to thrive.  At the infusion center he was noted to have a rapid heart rate with irregular rhythm and he was transferred to the ED and admitted 06/24/21 with A flutter. 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 Hypertension, dyslipidemia, paroxysmal atrial fibrillation, coronary artery disease sp CABG, and bioprosthetic aortic valve replacement ? ?  ?PT Comments  ? ? General Comments: Pt sleeping at arrival but arousable and willing to participate with therapy . Feels "poorly". Spouse at bedside.  Limited session.  General bed mobility comments: increased time, cues for sequencing.  Very weak.  Limited ability.General transfer comment: Very brief sit to stand to take a few steps to [facility] + 2 Max Assist.  VERY weak. Assisted back to bed and positioned to comfort.    ?Recommendations for follow up therapy are one component of a multi-disciplinary discharge planning process, led by the attending physician.  Recommendations may be updated based on patient status, additional functional criteria and insurance authorization. ? ?Follow Up Recommendations ? Skilled nursing-short term rehab (<3 hours/day) ?  ?  ?Assistance Recommended at Discharge Frequent or constant Supervision/Assistance  ?Patient can return home with the following A lot of help with walking and/or transfers;Assistance with cooking/housework;Direct supervision/assist for financial management;Assist for transportation;A lot of help with bathing/dressing/bathroom ?  ?Equipment Recommendations ? Rolling walker (2  wheels);BSC/3in1;Other (comment)  ?  ?Recommendations for Other Services   ? ? ?  ?Precautions / Restrictions Precautions ?Precautions: Fall ?Precaution Comments: orthostatic ?Restrictions ?Weight Bearing Restrictions: No  ?  ? ?Mobility ? Bed Mobility ?Overal bed mobility: Needs Assistance ?Bed Mobility: Supine to Sit, Sit to Supine ?  ?  ?Supine to sit: Mod assist, Max assist ?Sit to supine: Max assist ?  ?General bed mobility comments: increased time, cues for sequencing.  Very weak.  Limited ability. ?  ? ?Transfers ?Overall transfer level: Needs assistance ?Equipment used: Rolling walker (2 wheels) ?Transfers: Sit to/from Stand ?  ?  ?  ?  ?  ?  ?General transfer comment: Very brief sit to stand to take a few steps to [facility] + 2 Max Assist.  VERY weak. ?  ? ?Ambulation/Gait ?  ?  ?  ?  ?  ?  ?  ?General Gait Details: not able due to profound weakness and pt feeling "poorly".  Also soft BP's. ? ? ?Stairs ?  ?  ?  ?  ?  ? ? ?Wheelchair Mobility ?  ? ?Modified Rankin (Stroke Patients Only) ?  ? ? ?  ?Balance   ?  ?  ?  ?  ?  ?  ?  ?  ?  ?  ?  ?  ?  ?  ?  ?  ?  ?  ?  ? ?  ?Cognition Arousal/Alertness: Lethargic ?  ?Overall Cognitive Status: Within Functional Limits for tasks assessed ?  ?  ?  ?  ?  ?  ?  ?  ?  ?  ?  ?  ?  ?  ?  ?  ?General Comments: Pt sleeping at arrival but arousable and willing to participate  with therapy . Feels "poorly". ?  ?  ? ?  ?Exercises   ? ?  ?General Comments   ?  ?  ? ?Pertinent Vitals/Pain Pain Assessment ?Pain Assessment: No/denies pain  ? ? ?Home Living   ?  ?  ?  ?  ?  ?  ?  ?  ?  ?   ?  ?Prior Function    ?  ?  ?   ? ?PT Goals (current goals can now be found in the care plan section) Progress towards PT goals: Progressing toward goals ? ?  ?Frequency ? ? ? Min 2X/week ? ? ? ?  ?PT Plan Frequency needs to be updated;Discharge plan needs to be updated  ? ? ?Co-evaluation   ?  ?  ?  ?  ? ?  ?AM-PAC PT "6 Clicks" Mobility   ?Outcome Measure ? Help needed turning from your back to your  side while in a flat bed without using bedrails?: A Lot ?Help needed moving from lying on your back to sitting on the side of a flat bed without using bedrails?: A Lot ?Help needed moving to and from a bed to a chair (including a wheelchair)?: A Lot ?Help needed standing up from a chair using your arms (e.g., wheelchair or bedside chair)?: A Lot ?Help needed to walk in hospital room?: Total ?Help needed climbing 3-5 steps with a railing? : Total ?6 Click Score: 10 ? ?  ?End of Session Equipment Utilized During Treatment: Gait belt ?Activity Tolerance: Patient limited by fatigue ?Patient left: in bed;with bed alarm set;with call bell/phone within reach;with family/visitor present ?Nurse Communication: Mobility status ?PT Visit Diagnosis: Difficulty in walking, not elsewhere classified (R26.2);Muscle weakness (generalized) (M62.81);Other abnormalities of gait and mobility (R26.89) ?  ? ? ?Time: 7035-0093 ?PT Time Calculation (min) (ACUTE ONLY): 15 min ? ?Charges:  $Therapeutic Activity: 8-22 mins          ?          ?Rica Koyanagi  PTA ?Acute  Rehabilitation Services ?Pager      [phone] ?Office      [phone] ? ?

## 2021-07-01 NOTE — Consult Note (Signed)
WOC Nurse Consult Note: ?Patient receiving care in [place]. NT present at time of my visit. ?Reason for Consult: sacral wound ?Wound type: ?Pressure Injury POA: Yes/No/NA ?Measurement: ?Wound bed: ?Drainage (amount, consistency, odor)  ?Periwound: ?Dressing procedure/placement/frequency: ?In the presence of the NT, the patient very politely refused to turn for me to evaluate his sacrum. He told me someone had looked at it already and placed a dressing, and "I'd rather not go through that again". ? ?I communicated this to his primary RN. ? ?[facility] nurse will not follow at this time.  Please re-consult the [place] team if needed. ? ?Val Riles, RN, MSN, CWOCN, CNS-BC, pager [phone]  ?

## 2021-07-01 NOTE — Care Management Important Message (Signed)
Important Message ? ?Patient Details IM Letter placed in Patients room. ?Name: Lawrence Hall ?MRN: [id number] ?Date of Birth: March 22, 1946 ? ? ?Medicare Important Message Given:  Yes ? ? ? ? ?Kerin Salen ?07/01/2021, 12:07 PM ?

## 2021-07-01 NOTE — Progress Notes (Signed)
Palliative: ? ?HPI: 76 y.o. male  with past medical history of stage IV urothelial cancer mets to liver and bone (no current treatment), renal cell cancer 2021, hypertension, paroxysmal atrial fibrillation, [name], bioprosthetic aortic placement admitted from cancer center infusion center on 06/24/2021 with palpitations and dyspnea related to atrial fibrillation RVR with dehydration.   ? ?I met today with Lawrence Hall and wife is not at bedside. There is no family or visitors present at this time. Lawrence Hall is sleeping but awakens when I pull chair up to his bedside. He struggles to stay awake to speak with me so I promised to try and keep my visit short but had some important questions to ask him. I asked him what we can do for him and he tells me "let me sleep." I explained that I will. I prefaced by telling him that I had a very difficult question to ask and I asked him if he feels like he is getting close and he paused and tells me "yes." I asked him if he is tired and wants [place] to focus his care more on just letting him rest and be comfortable and he says "yes." I asked him if at this stage he would want CPR, breathing machines, and life support measures and he tells me "no." I asked him if he would want hospice to help care for him and he was unable to answer. I reassured him that I will help support him and his wife and that we do not have to figure all this out today. I reassured him that we will continue to take good care of him. I allowed him to go back to sleep and rest as requested.  ? ?Emotional support provided. Updated RN and Dr. Wyline Copas to my conversation. I plan to meet and discuss further with wife tomorrow to explain conversation and provide support to her.  ? ?Exam: Sleepy but appropriate responses. Pale. No distress. Breathing regular, unlabored. Abd flat.  ? ?Plan: ?- Lawrence Hall has able to clarify his wishes today but I need to discuss this further along with his wife in person given the difficult nature of  these discussions.  ? ?35 min ? ?Lawrence Sill, NP ?Palliative Medicine Team ?Pager [phone] (Please see [email or web address] for schedule) ?Team Phone [phone]  ? ? ?Greater than 50%  of this time was spent counseling and coordinating care related to the above assessment and plan   ?

## 2021-07-01 NOTE — Progress Notes (Signed)
?Progress Note ? ? ?Patient: Lawrence Hall UYQ:[id number] DOB: Jan 09, 1946 DOA: 06/24/2021     7 ?DOS: the patient was seen and examined on 07/01/2021 ?  ?Brief hospital course: ?76 y.o. 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 Hypertension, dyslipidemia, paroxysmal atrial fibrillation, coronary artery disease sp CABG, and bioprosthetic aortic valve replacement.  ?  ?Patient has been very weak and deconditioned, poor mobility due to generalized weakness, poor balance and failure to thrive. He has been treated with weekly IV fluids for hydration per Dr Alen Blew as part of palliative care.  ?Today before getting to the infusion center he felt palpitations, moderate in intensity, associated with dyspnea but not chest pain, no improving or worsening factors. ?At the infusion center he was noted to have a rapid heart rate with irregular rhythm and he was transferred to the ED for further evaluation.  ? ?Assessment and Plan: ?* Atrial flutter ([place]) ?Patient was initially placed on diltiazem for rate control, later transitioned to PO cardizem with metoprolol '50mg'$  bid ? ?Patient with advanced cancer, now under palliative care. ?There were concerns of increased risk of bleeding thus held on anticoagulation. CHADS of 5 ? ?Given concerns of dehydration, was given IVF ? ?His echocardiogram from 2020 had a preserved LV systolic function with no significant valvular disease. ? ?Repeat 2d echo with EF 40-45%, which is lower than previous. Pt also with wall motion noted.  ? ?[facility] input. Pt now on metoprolol '100mg'$  with cardizem d/c'd ? ?Likely goal here is rate control without anticoagulation, knowing pt has increased risk for stroke down the road. Overall difficult situation ? ?Cardiology has since signed off ? ?Intermittent runs of tachycardia to the 130's note, have ordered PRN  labetalol ? ?Hyponatremia ?Na is 130 at presentation ?Pt reports limited PO intake leading up to admit ?Pt was given IVF hydration ?Pt appears euvolemic on exam ?Serum osm 275, urine osm 359 with urine Na 41 ?Will give trial of fluid restriction and repeat bmet in AM ? ?Hyperbilirubinemia ?Patient with clinical icterus and elevated total bilirubin. ?Likely worsening metastatic disease. ? ?Acute on chronic liver failure.  ?Liver US reviewed. Findings of extensive hepatic mets in innumerable gallstones, GB thickening likely reactive. Denies abd pain ?He has chronic elevation of AST and ALT likely secondary to malignancy ?Ammonia levels normal ? ?Hyperlipidemia ?Continue with statin therapy  ? ?Essential hypertension ?Continue blood pressure monitoring. ?BP stable currently on metoprolol ? ?CAD (coronary artery disease), native coronary artery ?Pt is s/p CABG in 1/20 ?No chest pain.  ?Continue blood pressure monitoring.  ?Echo reviewed, with reduced EF of 40-45% with wall motion abnormality ?[facility] input. Not candidate for invasive work up. Medical management planned ? ?Given worsening heart function not amenable to further invasive work up in setting of malignancy, have consulted Palliative Care for goals of care ? ?Malignant tumor of renal pelvis, left (HCC) ?Renal pelvis tumor in 2021, then developed stage IV high grade urothelial carcinoma with hepatic as well as bone metastasis.  ? ?Patient was referred to palliative care as outpatient. Per Pt's wife, pt had declined outpt palliaitve ?Getting IV fluids once a week as outpatient.  ?Palliative Care is now following. ?Very poor prognosis ? ? ?Depression ?Now more awake since holding remeron ? ?Chronic anemia ?Cell count had been stable ?Anemia is associated to malignancy.  ?Cont to follow CBC trends ? ?Pneumonia ?Febrile to 38.1 on  3/13 ?CXR ordered and reviewed. Findings of B basilar opacities suggestive of PNA ?Now on azithromycin and rocephin ?UA  unremarkable, now afebrile ? ?Orthostatic hypotension ?Recently noted to have BP rapidly dropping on standing ?Pt clinically dehydrated with limited PO intake ?Improved with IVF hydration ? ? ?  ? ?Subjective:Denies abd pain, reports voiding well. Does feel weak but without other complaints ? ?Physical Exam: ?Vitals:  ? 07/01/21 0801 07/01/21 0932 07/01/21 1000 07/01/21 1219  ?BP: 110/63  122/71 (!) 106/59  ?Pulse: (!) 105 (!) 126  70  ?Resp: '16  17 16  '$ ?Temp: (!) 97.3 ?F (36.3 ?C)  97.7 ?F (36.5 ?C) 97.9 ?F (36.6 ?C)  ?TempSrc: Oral  Oral Oral  ?SpO2: 98%  100% 100%  ?Weight:      ?Height:      ? ?General exam: Awake, laying in bed, in nad ?Respiratory system: Normal respiratory effort, no wheezing ?Cardiovascular system: regular rate, s1, s2 ?Gastrointestinal system: Soft, nondistended, positive BS ?Central nervous system: CN2-12 grossly intact, strength intact ?Extremities: Perfused, no clubbing ?Skin: Normal skin turgor, no notable skin lesions seen ?Psychiatry: Mood normal // no visual hallucinations  ? ?Data Reviewed: ? ?Na 131 ? ?Family Communication: Pt in room, wife at bedside ? ?Disposition: ?Status is: Inpatient ?Remains inpatient appropriate because: Severity of illness ? Planned Discharge Destination: Home with Home Health ? ? ? ?Author: ?Marylu Lund, MD ?07/01/2021 12:53 PM ? ?For on call review [email or web address]  ?

## 2021-07-02 ENCOUNTER — Encounter: Payer: Self-pay | Admitting: General Practice

## 2021-07-02 DIAGNOSIS — Z7189 Other specified counseling: Secondary | ICD-10-CM | POA: Diagnosis not present

## 2021-07-02 DIAGNOSIS — I4892 Unspecified atrial flutter: Secondary | ICD-10-CM | POA: Diagnosis not present

## 2021-07-02 DIAGNOSIS — Z515 Encounter for palliative care: Secondary | ICD-10-CM | POA: Diagnosis not present

## 2021-07-02 DIAGNOSIS — Z66 Do not resuscitate: Secondary | ICD-10-CM | POA: Diagnosis not present

## 2021-07-02 DIAGNOSIS — R627 Adult failure to thrive: Secondary | ICD-10-CM | POA: Diagnosis not present

## 2021-07-02 LAB — CBC
HCT: 22.8 % — ABNORMAL LOW (ref 39.0–52.0)
Hemoglobin: 7.2 g/dL — ABNORMAL LOW (ref 13.0–17.0)
MCH: 29.8 pg (ref 26.0–34.0)
MCHC: 31.6 g/dL (ref 30.0–36.0)
MCV: 94.2 fL (ref 80.0–100.0)
Platelets: 157 10*3/uL (ref 150–400)
RBC: 2.42 MIL/uL — ABNORMAL LOW (ref 4.22–5.81)
RDW: 17.9 % — ABNORMAL HIGH (ref 11.5–15.5)
WBC: 11.6 10*3/uL — ABNORMAL HIGH (ref 4.0–10.5)
nRBC: 0 % (ref 0.0–0.2)

## 2021-07-02 LAB — COMPREHENSIVE METABOLIC PANEL
ALT: 41 U/L (ref 0–44)
AST: 65 U/L — ABNORMAL HIGH (ref 15–41)
Albumin: 1.7 g/dL — ABNORMAL LOW (ref 3.5–5.0)
Alkaline Phosphatase: 273 U/L — ABNORMAL HIGH (ref 38–126)
Anion gap: 6 (ref 5–15)
BUN: 21 mg/dL (ref 8–23)
CO2: 22 mmol/L (ref 22–32)
Calcium: 9.7 mg/dL (ref 8.9–10.3)
Chloride: 103 mmol/L (ref 98–111)
Creatinine, Ser: 1.1 mg/dL (ref 0.61–1.24)
GFR, Estimated: >60 mL/min (ref >60)
Glucose, Bld: 97 mg/dL (ref 70–99)
Potassium: 4.1 mmol/L (ref 3.5–5.1)
Sodium: 131 mmol/L — ABNORMAL LOW (ref 135–145)
Total Bilirubin: 2 mg/dL — ABNORMAL HIGH (ref 0.3–1.2)
Total Protein: 5.1 g/dL — ABNORMAL LOW (ref 6.5–8.1)

## 2021-07-02 LAB — URINE CULTURE: Culture: <10000 — AB

## 2021-07-02 MED ORDER — BOOST / RESOURCE BREEZE PO LIQD CUSTOM
1.0000 | Freq: Two times a day (BID) | ORAL | Status: DC
Start: 2021-07-02 — End: 2021-07-07
  Administered 2021-07-02: 1 via ORAL

## 2021-07-02 NOTE — Plan of Care (Signed)
  Problem: Education: Goal: Knowledge of General Education information will improve Description Including pain rating scale, medication(s)/side effects and non-pharmacologic comfort measures Outcome: Progressing   

## 2021-07-02 NOTE — Progress Notes (Signed)
[place] Spiritual Care Note ? ?Followed up inpatient today, while Mr Trumpower primarily rested, though he seemed pleased to register my visit. Spent time with his wife [name]; previously we had only talked by phone. She was welcoming of pastoral presence and shared about how she likes to keep positive, focusing on special memories and moments during the day. Will continue to follow for support as they navigate Mr Deluna's care needs. ? ? ?Chaplain Lorrin Jackson, MDiv, [facility] ?Pager [phone] ?Voicemail [phone]  ?

## 2021-07-02 NOTE — Telephone Encounter (Signed)
Currently admitted.

## 2021-07-02 NOTE — Progress Notes (Signed)
OT Cancellation Note ? ?Patient Details ?Name: Lawrence Hall ?MRN: [id number] ?DOB: 1946-03-07 ? ? ?Cancelled Treatment:    Reason Eval/Treat Not Completed: Patient declined, no reason specified. Declined therapy today and requesting try tomorrow. Will f/u as able. ? ?[name] ?07/02/2021, 12:51 PM ?

## 2021-07-02 NOTE — Progress Notes (Signed)
?Progress Note ? ? ?Patient: Lawrence Hall WTU:[id number] DOB: [birth year] DOA: 06/24/2021     8 ?DOS: the patient was seen and examined on 07/02/2021 ?  ?Brief hospital course: ?76 y.o. 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 Hypertension, dyslipidemia, paroxysmal atrial fibrillation, coronary artery disease sp CABG, and bioprosthetic aortic valve replacement.  ?  ?Patient has been very weak and deconditioned, poor mobility due to generalized weakness, poor balance and failure to thrive. He has been treated with weekly IV fluids for hydration per Dr Alen Blew as part of palliative care.  ?Today before getting to the infusion center he felt palpitations, moderate in intensity, associated with dyspnea but not chest pain, no improving or worsening factors. ?At the infusion center he was noted to have a rapid heart rate with irregular rhythm and he was transferred to the ED for further evaluation.  ? ?He was admitted and stabilized and currently palliative care is discussing goals of care in terms of plans for end-of-life with him and his family ? ?Assessment and Plan: ? ?Atrial flutter CHADS2 score >4 ?Continue metoprolol 50 twice daily, p.o. Cardizem (was on Cardizem gtt.)-as needed labetalol IV for intermittent tachycardia \ ?No anticoagulation given history of bleeding ?Euvolemic hyponatremia ?Improved ?Possible pneumonia since 3/13 ?Continuing antibiotics azithromycin and ceftriaxone until 3/18 ?May be able to switch to p.o. if he is durably awake during the day ?High-grade urothelial stage IV CA with hepatic and bony mets ?Outpatient IV fluids being given q. Weekly ?Chronic anemia ?Is now DNR-see detailed discussion by Ms. Jerline Pain ?I have also consolidated this discussion with patient and wife at the bedside 3/15--- patient states he does not recall everything mentioned when he was talking with  Ms. Jerline Pain  ?Patient has metastatic disease as evidenced by elevations of LFTs ?we talked in simple terms about dying being a part of living-his wife was given ample opportunity to ask questions ?CABG 2020 ?HFrEF this admission 40% orthostatic hypotension ?Cardiology evaluated patient he is not a candidate for invasive work-up ?Palliative care is involved ?For his orthostasis-careful with ambulation and postural change ?Depression ?Would discontinue Remeron as this was being used more as a sleep aid and he is very somnolent in the morning ?Consider discontinuation of Megace ? ?  ? ?Subjective: ?Awake when I saw him. ?Make meaningful conversation ? ?Physical Exam: ?Vitals:  ? 07/02/21 0200 07/02/21 0400 07/02/21 0600 07/02/21 1400  ?BP: 113/76 113/65 (!) 114/59 110/69  ?Pulse: (!) 104 (!) 26 91 80  ?Resp: (!) 0 (!) '3 12 16  '$ ?Temp:    98.1 ?F (36.7 ?C)  ?TempSrc:    Oral  ?SpO2: 97% 98% 96% 99%  ?Weight:      ?Height:      ? ?Cachectic white male in no distress ?EOMI NCAT poor dentition mucosa dry ?Tachycardic S1-S2 ?Abdomen scaphoid no rebound no guarding ?No lower extremity edema ? ?Data Reviewed: ? ?CMP Latest Ref Rng & Units 07/02/2021 07/01/2021 06/30/2021  ?Glucose 70 - 99 mg/dL 97 81 95  ?BUN 8 - 23 mg/dL '21 17 16  '$ ?Creatinine 0.61 - 1.24 mg/dL 1.10 1.07 1.05  ?Sodium 135 - 145 mmol/L 131(L) 131(L) 130(L)  ?Potassium 3.5 - 5.1 mmol/L 4.1 4.0 3.9  ?Chloride 98 - 111 mmol/L 103 102 102  ?CO2 22 - 32 mmol/L '22 22 22  '$ ?Calcium 8.9 - 10.3 mg/dL 9.7 9.3 9.0  ?Total Protein 6.5 - 8.1  g/dL 5.1(L) 5.1(L) 5.1(L)  ?Total Bilirubin 0.3 - 1.2 mg/dL 2.0(H) 2.0(H) 1.8(H)  ?Alkaline Phos 38 - 126 U/L 273(H) 260(H) 250(H)  ?AST 15 - 41 U/L 65(H) 55(H) 44(H)  ?ALT 0 - 44 U/L 41 34 27  ? ?CBC ?   ?Component Value Date/Time  ? WBC 11.6 (H) 07/02/2021 0424  ? RBC 2.42 (L) 07/02/2021 0424  ? HGB 7.2 (L) 07/02/2021 0424  ? HGB 7.8 (L) 06/24/2021 1139  ? HCT 22.8 (L) 07/02/2021 0424  ? PLT 157 07/02/2021 0424  ? PLT 203 06/24/2021 1139   ? MCV 94.2 07/02/2021 0424  ? MCH 29.8 07/02/2021 0424  ? MCHC 31.6 07/02/2021 0424  ? RDW 17.9 (H) 07/02/2021 0424  ? LYMPHSABS 0.9 06/24/2021 1420  ? MONOABS 0.6 06/24/2021 1420  ? EOSABS 0.0 06/24/2021 1420  ? BASOSABS 0.0 06/24/2021 1420  ? ? ? ?Family Communication: Discussed in detail with the wife at the bedside ? ?Disposition: ?Status is: Inpatient ?Remains inpatient appropriate because: Severity of illness ? Planned Discharge Destination: Home with Home Health ? ? ? ?Author: ?Nita Sells, MD ?07/02/2021 6:22 PM ? ?For on call review [email or web address]  ?

## 2021-07-02 NOTE — Progress Notes (Signed)
Nutrition Follow-up ? ?DOCUMENTATION CODES:  ? ?Severe malnutrition in context of chronic illness ? ?INTERVENTION:  ?- continue Ensure Plus High Protein TID, each supplement provides 350 kcal and 20 grams of protein. ?- ask patient each time if he would like ice cream mixed with his Ensure. ?- will order Boost Breeze BID, each supplement provides 250 kcal and 9 grams of protein. ?- called [facility] and having orange sherbet stocked in the freezer for patient. ? ? ?NUTRITION DIAGNOSIS:  ? ?Severe Malnutrition related to chronic illness, cancer and cancer related treatments as evidenced by severe fat depletion, severe muscle depletion. -ongoing ? ?GOAL:  ? ?Patient will meet greater than or equal to 90% of their needs -unmet on average ? ?MONITOR:  ? ?PO intake, Supplement acceptance, Labs, Weight trends ? ? ?ASSESSMENT:  ? ?76 y.o. male with medical history of renal pelvis tumor in 2021, stage IV high grade urothelial carcinoma with hepatic metastasis, under palliative care, HTN, dyslipidemia, afib, [name], and bioprosthetic aortic valve replacement. He has been very weak, severely deconditioned, poor mobility, poor balance, and FTT. He was at the [facility] for infusion and began to have palpitations associated with dyspnea. He was admitted with aflutter. ? ?Patient laying in bed. His wife was visiting but had stepped out prior to RD visit.  ? ?Review of flow sheet and orders indicates that patient was eating mainly 50-100% at meals on 3/11, 3/12, and 3/13 and that yesterday he ate 50% of breakfast and today ate 0% of breakfast. He has been accepting Ensure 75-90% of the time offered.  ? ?Patient very drowsy and talks in a hushed tone. He reports enjoying apple juice, ice cream, Ensure, and orange sherbet. He confirms that he is currently preferring things that require minimal to no chewing. Talked with him about the interventions listed above and he is very Patent attorney.  ? ?He has not been weighed since 3/7.  Non-pitting edema to all extremities documented in the edema section of flow sheet.  ? ?Palliative Care is following and last saw patient yesterday.  ?  ? ?Labs reviewed; Na: 131 mmol/l, Alk Phos elevated and trending up for the past 2 days.  ? ?Medications reviewed; 400 mg megace/day, 1 tablet multivitamin with minerals/day, 17 g miralax/day. ? ? ? ?Diet Order:   ?Diet Order   ? ?       ?  Diet regular Room service appropriate? Yes; Fluid consistency: Thin  Diet effective now       ?  ? ?  ?  ? ?  ? ? ?EDUCATION NEEDS:  ? ?Not appropriate for education at this time ? ?Skin:  Skin Assessment: Skin Integrity Issues: ?Skin Integrity Issues:: Stage II ?Stage II: sacrum (newly documented on 3/13) ? ?Last BM:  3/14 (type 4, small amount) ? ?Height:  ? ?Ht Readings from Last 1 Encounters:  ?06/24/21 6' (1.829 m)  ? ? ?Weight:  ? ?Wt Readings from Last 1 Encounters:  ?06/24/21 66.1 kg  ? ? ? ?BMI:  Body mass index is 19.76 kg/m?. ? ?Estimated Nutritional Needs:  ?Kcal:  2100-2300 kcal ?Protein:  105-120 grams ?Fluid:  >/= 2.3 L/day ? ? ? ? ?Jarome Matin, MS, RD, LDN ?Inpatient Clinical Dietitian ?RD pager # available in [place]  ?After hours/weekend pager # available in [place] ? ?

## 2021-07-02 NOTE — Progress Notes (Signed)
Palliative: ? ?HPI: 76 y.o. male  with past medical history of stage IV urothelial cancer mets to liver and bone (no current treatment), renal cell cancer 2021, hypertension, paroxysmal atrial fibrillation, [name], bioprosthetic aortic placement admitted from cancer center infusion center on 06/24/2021 with palpitations and dyspnea related to atrial fibrillation RVR with dehydration.    ? ?Lawrence Hall is sleeping peacefully this morning. NT reports that he awoke and spoke with her briefly and refused breakfast - he just wants to sleep. I met privately with wife, Lawrence Hall. Lawrence Hall expressed the health challenges and family stressors they have both had for many years. She is able to tell me how Lawrence Hall was able to bounce back many times before but is not able to bounce back from his health issues as he once was able. She is able to acknowledge that his time left is limited and they know the cancer is progressing and limiting him. She talks of wanting him to have quality and I discussed with her further about what she considers quality to be for him. She talks about Lawrence Hall being angry and frustrated and she wants him to focus on positive and good. I explained that it is not unusual for people to feel anger and frustration as their health worsens as it is a bad feeling to be out of control of their own body and life. Lawrence Hall is able to acknowledge this but remains steadfast that he needs to just be more positive and be surrounded by positive and not negative. I agree with her but also explained that sometimes we need to discuss the difficult topics so that we know how to best take care of him.  ? ?I gently relayed to her my conversation with Lawrence Hall yesterday afternoon. I explained that his wishes were clear that he desired DNR and would not want to go through CPR, shock, intubation or be connected to life support machines. Lawrence Hall is appropriately tearful and seems surprised to hear this as this is not aligned with his previous stated  wishes. I explained that people can often change their views on what they wish to go through as their health and quality of life changes. Lawrence Hall is not sure that she agrees but tells me that this is his decision and not hers. She also feels that if he were feeling a little better or in a more positive mood that he may not feel this way. I explained that he can certainly change his mind but at this time I recommend DNR status based on his stated wishes and she understands and agrees we should follow his wishes. Lawrence Hall explains that she does not want anyone giving up on him or not giving him treatments such as IV fluids and medications that she feels is helping him. I explained that we have no plans at this time to stop or change any aspect of his care but only if the worst were to happen that we would not put him through resuscitative measures - she understands. She expresses some comfort in their shared faith and knowing that his heart and spirit are good and "we know where he is going."  ? ?All questions/concerns addressed. Emotional support provided. Updated Dr. Verlon Au to discussion and situation.  ? ?Exam: Sleeping - I did not awaken today. No distress. Cheeks pink and flushed. HR irregular but rate controlled. Breathing regular, unlabored.  ? ?Plan: ?- DNR put in place after discussion with patient yesterday and confirmed with wife today.  ?- Continue  all other interventions to optimize while also allowing comfort and rest.  ? ?80 min ? ?Vinie Sill, NP ?Palliative Medicine Team ?Pager [phone] (Please see [email or web address] for schedule) ?Team Phone [phone]  ? ? ?Greater than 50%  of this time was spent counseling and coordinating care related to the above assessment and plan   ?

## 2021-07-02 NOTE — Hospital Course (Addendum)
Atrial flutter CHADS2 score >4 ?Continue metoprolol 50 twice daily, p.o. Cardizem (was on Cardizem gtt.)-as needed labetalol IV for intermittent tachycardia  ?No anticoagulation --given history of bleeding ?Telemetry was discontinued 3/18 ?Euvolemic hyponatremia ?Improved--stable in 130 range-stop checking labs ?Possible pneumonia since 3/13 ?Patient was on azithromycin and ceftriaxone-antibiotics discontinued 3/18 ?It is not expected that this patient will survive beyond 2-4 weeks ?High-grade urothelial stage IV CA with hepatic and bony mets ?Outpatient IV fluids being given q. Weekly ?Chronic anemia ?now DNR-see detailed discussion by Ms. Jerline Pain. ?consolidated this discussion with patient and wife at the bedside 3/15 ?metastatic disease is evidenced by elevations of LFTs ?Appreciative of Dr. Alen Blew, Dr. Rowe Pavy and Dr. Orene Desanctis input ?Patient has intermittent confusion-wife who is surrogate decision maker has decided on freestanding hospice if available ?CABG 2020 ?HFrEF this admission 40% orthostatic hypotension ?Cardiology evaluated patient he is not a candidate for invasive work-up-greatly appreciate bedside visit on 3/18 by his primary cardiologist Dr. Orene Desanctis ?Depression ?Depressed but much more awake with discontinuation orf Trazadone/Remeron ?Consider discontinuation of Megace ? ?

## 2021-07-03 ENCOUNTER — Encounter: Payer: Self-pay | Admitting: General Practice

## 2021-07-03 DIAGNOSIS — Z515 Encounter for palliative care: Secondary | ICD-10-CM | POA: Diagnosis not present

## 2021-07-03 DIAGNOSIS — I4892 Unspecified atrial flutter: Secondary | ICD-10-CM | POA: Diagnosis not present

## 2021-07-03 DIAGNOSIS — R627 Adult failure to thrive: Secondary | ICD-10-CM | POA: Diagnosis not present

## 2021-07-03 DIAGNOSIS — Z7189 Other specified counseling: Secondary | ICD-10-CM | POA: Diagnosis not present

## 2021-07-03 LAB — COMPREHENSIVE METABOLIC PANEL
ALT: 41 U/L (ref 0–44)
AST: 69 U/L — ABNORMAL HIGH (ref 15–41)
Albumin: 1.7 g/dL — ABNORMAL LOW (ref 3.5–5.0)
Alkaline Phosphatase: 267 U/L — ABNORMAL HIGH (ref 38–126)
Anion gap: 5 (ref 5–15)
BUN: 26 mg/dL — ABNORMAL HIGH (ref 8–23)
CO2: 22 mmol/L (ref 22–32)
Calcium: 9.6 mg/dL (ref 8.9–10.3)
Chloride: 103 mmol/L (ref 98–111)
Creatinine, Ser: 1.23 mg/dL (ref 0.61–1.24)
GFR, Estimated: >60 mL/min (ref >60)
Glucose, Bld: 85 mg/dL (ref 70–99)
Potassium: 4 mmol/L (ref 3.5–5.1)
Sodium: 130 mmol/L — ABNORMAL LOW (ref 135–145)
Total Bilirubin: 2.1 mg/dL — ABNORMAL HIGH (ref 0.3–1.2)
Total Protein: 5.1 g/dL — ABNORMAL LOW (ref 6.5–8.1)

## 2021-07-03 LAB — CULTURE, BLOOD (ROUTINE X 2): Special Requests: ADEQUATE

## 2021-07-03 LAB — CBC WITH DIFFERENTIAL/PLATELET
Abs Immature Granulocytes: 0.09 10*3/uL — ABNORMAL HIGH (ref 0.00–0.07)
Basophils Absolute: 0 10*3/uL (ref 0.0–0.1)
Basophils Relative: 0 %
Eosinophils Absolute: 0 10*3/uL (ref 0.0–0.5)
Eosinophils Relative: 0 %
HCT: 22.4 % — ABNORMAL LOW (ref 39.0–52.0)
Hemoglobin: 7.1 g/dL — ABNORMAL LOW (ref 13.0–17.0)
Immature Granulocytes: 1 %
Lymphocytes Relative: 6 %
Lymphs Abs: 0.8 10*3/uL (ref 0.7–4.0)
MCH: 29.8 pg (ref 26.0–34.0)
MCHC: 31.7 g/dL (ref 30.0–36.0)
MCV: 94.1 fL (ref 80.0–100.0)
Monocytes Absolute: 1.3 10*3/uL — ABNORMAL HIGH (ref 0.1–1.0)
Monocytes Relative: 10 %
Neutro Abs: 11.1 10*3/uL — ABNORMAL HIGH (ref 1.7–7.7)
Neutrophils Relative %: 83 %
Platelets: 159 10*3/uL (ref 150–400)
RBC: 2.38 MIL/uL — ABNORMAL LOW (ref 4.22–5.81)
RDW: 17.9 % — ABNORMAL HIGH (ref 11.5–15.5)
WBC: 13.4 10*3/uL — ABNORMAL HIGH (ref 4.0–10.5)
nRBC: 0 % (ref 0.0–0.2)

## 2021-07-03 LAB — MAGNESIUM: Magnesium: 1.9 mg/dL (ref 1.7–2.4)

## 2021-07-03 NOTE — Progress Notes (Signed)
?Progress Note ? ? ?Patient: Lawrence Hall ITG:[id number] DOB: [birth year] DOA: 06/24/2021     9 ?DOS: the patient was seen and examined on 07/03/2021 ?  ?Brief hospital course: ?76 y.o. 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 Hypertension, dyslipidemia, paroxysmal atrial fibrillation, coronary artery disease sp CABG, and bioprosthetic aortic valve replacement.  ?  ?Patient has been very weak and deconditioned, poor mobility due to generalized weakness, poor balance and failure to thrive. He has been treated with weekly IV fluids for hydration per Dr Alen Blew as part of palliative care.  ?Before getting to the infusion center he felt palpitations, moderate in intensity, associated with dyspnea but not chest pain, no improving or worsening factors. ?At the infusion center he was noted to have a rapid heart rate with irregular rhythm and he was transferred to the ED for further evaluation.  ? ?He was admitted and stabilized and currently palliative care is discussing goals of care in terms of plans for end-of-life with him and his family ? ?Assessment and Plan: ? ?Atrial flutter CHADS2 score >4 ?Continue metoprolol 50 twice daily, p.o. Cardizem (was on Cardizem gtt.)-as needed labetalol IV for intermittent tachycardia  ?No anticoagulation --given history of bleeding ?Euvolemic hyponatremia ?Improved--stable in 130 range ?Possible pneumonia since 3/13 ?Continuing antibiotics azithromycin and ceftriaxone until 3/18 ?? switch to p.o. if he is durably awake during the day ?High-grade urothelial stage IV CA with hepatic and bony mets ?Outpatient IV fluids being given q. Weekly ?Chronic anemia ?now DNR-see detailed discussion by Ms. Jerline Pain. ?consolidated this discussion with patient and wife at the bedside 3/15 ?metastatic disease is evidenced by elevations of LFTs ?Patient seems to be vacillating  regarding medical decision making---he tells me he isn't sure about DNAR status-- ?I have deferred to the patient and his wife to continue these discussions and have asked that they reach out to me tonight if they align towards FULL code statu ?He remains DNR ?If he drops his hemoglobin below 7, would transfuse ?CABG 2020 ?HFrEF this admission 40% orthostatic hypotension ?Cardiology evaluated patient he is not a candidate for invasive work-up ?Palliative care is involved ?For his orthostasis-careful with ambulation and postural change ?Depression ?Depressed but much more awake with discontinuation orf Trazadone/Remeron ?Consider discontinuation of Megace ? ?  ? ?Subjective: ? ?Much more awake cogent and can discuss his concerns ?He wants his wife and him to talk about DNR ?He is eating minimally ? ? ?Physical Exam: ?Vitals:  ? 07/02/21 0400 07/02/21 0600 07/02/21 1400 07/03/21 1505  ?BP: 113/65 (!) 114/59 110/69 106/64  ?Pulse: (!) 26 91 80 85  ?Resp: (!) '3 12 16 14  '$ ?Temp:   98.1 ?F (36.7 ?C) 98.6 ?F (37 ?C)  ?TempSrc:   Oral Oral  ?SpO2: 98% 96% 99% 99%  ?Weight:      ?Height:      ? ?white male in no distress ?EOMI NCAT poor dentition mucosa dry ?Tachycardic S1-S2 ?Abdomen scaphoid no rebound no guarding ?No lower extremity edema ? ?Data Reviewed: ? ?CMP Latest Ref Rng & Units 07/03/2021 07/02/2021 07/01/2021  ?Glucose 70 - 99 mg/dL 85 97 81  ?BUN 8 - 23 mg/dL 26(H) 21 17  ?Creatinine 0.61 - 1.24 mg/dL 1.23 1.10 1.07  ?Sodium 135 - 145 mmol/L 130(L) 131(L) 131(L)  ?Potassium 3.5 - 5.1 mmol/L 4.0 4.1 4.0  ?Chloride 98 - 111 mmol/L 103 103 102  ?CO2 22 -  32 mmol/L '22 22 22  '$ ?Calcium 8.9 - 10.3 mg/dL 9.6 9.7 9.3  ?Total Protein 6.5 - 8.1 g/dL 5.1(L) 5.1(L) 5.1(L)  ?Total Bilirubin 0.3 - 1.2 mg/dL 2.1(H) 2.0(H) 2.0(H)  ?Alkaline Phos 38 - 126 U/L 267(H) 273(H) 260(H)  ?AST 15 - 41 U/L 69(H) 65(H) 55(H)  ?ALT 0 - 44 U/L 41 41 34  ? ?CBC ?   ?Component Value Date/Time  ? WBC 13.4 (H) 07/03/2021 0402  ? RBC 2.38 (L)  07/03/2021 0402  ? HGB 7.1 (L) 07/03/2021 0402  ? HGB 7.8 (L) 06/24/2021 1139  ? HCT 22.4 (L) 07/03/2021 0402  ? PLT 159 07/03/2021 0402  ? PLT 203 06/24/2021 1139  ? MCV 94.1 07/03/2021 0402  ? MCH 29.8 07/03/2021 0402  ? MCHC 31.7 07/03/2021 0402  ? RDW 17.9 (H) 07/03/2021 0402  ? LYMPHSABS 0.8 07/03/2021 0402  ? MONOABS 1.3 (H) 07/03/2021 0402  ? EOSABS 0.0 07/03/2021 0402  ? BASOSABS 0.0 07/03/2021 0402  ? ? ? ?Family Communication: Discussed in detail with the wife at the bedside ? ?Disposition: ?Status is: Inpatient ?Remains inpatient appropriate because: Severity of illness ? Planned Discharge Destination: Home with Home Health ? ? ? ?Author: ?Nita Sells, MD ?07/03/2021 5:32 PM ? ?For on call review [email or web address].  ?

## 2021-07-03 NOTE — Progress Notes (Signed)
DNR bracelet was attached to pt. RN reiterated if his desire is still the same pt. acknowledged. Wife was present at bedside stating " if that is his decision for now". Reassured that medical team will continue to provide care. ?

## 2021-07-03 NOTE — Progress Notes (Signed)
OT Cancellation Note ? ?Patient Details ?Name: Lawrence Hall ?MRN: [id number] ?DOB: 1945-09-13 ? ? ?Cancelled Treatment:    Reason Eval/Treat Not Completed: Fatigue/lethargy limiting ability to participate. Patient declines therapy citing "a bad morning." He is somewhat confused and his conversation is tangential. He requests pain medicine but can't remember what it is called. He also exhibits some frustration with therapist when therapist stated she could not get the medicine or answer questions in regards to pain medicine or diagnosis. Patient weak with increased swelling in right upper swelling. Palliative care is involved. Will continue to follow until [place] solidified - but rehab potential poor.  ? ?[name] ?07/03/2021, 8:53 AM ?

## 2021-07-03 NOTE — Progress Notes (Signed)
Palliative: ? ?HPI: 76 y.o. male  with past medical history of stage IV urothelial cancer mets to liver and bone (no current treatment), renal cell cancer 2021, hypertension, paroxysmal atrial fibrillation, [name], bioprosthetic aortic placement admitted from cancer center infusion center on 06/24/2021 with palpitations and dyspnea related to atrial fibrillation RVR with dehydration.    ? ?I met today with Lawrence Hall. He is more alert and interactive this afternoon. No family present. Discussed along with RN Anderson Malta regarding how he is feeling. He seems more motivated and positive today as he wife has been hoping. He reports that he had pain earlier in his extremities but had relief from the Tylenol that Anderson Malta provided him. He has no further complaints. I took the opportunity while he seems more alert and able to converse more today to verify code status. I discussed with Lawrence Hall regarding his desire to pursue CPR, shock, intubation with breathing machine/life support. I explained to him what this entails and expectations of poor outcome if he were to decline to the point to need these interventions. Lawrence Hall expresses that he has a written directive (I have reviewed this previously in electronic record [facility] but the written special instructions are somewhat contradictory to his initialed instruction for no life prolonging measures in the setting of terminal illness). Dia is unsure of what he thinks at this stage and informs me that he would like to speak with his wife about this. I explained that I completely understand and I just want to be sure that we treat him appropriately and according to his wishes and what is best for him. I explained that it is just as important for [place] to know treatments/interventions that people do not want as it is to know what they do want. I reassured him that we will continue to care for him and help him regardless of these decisions.  ? ?All questions/concerns addressed. Emotional  support provided.  ? ?Exam: Awake, alert, oriented. Pale. Thin, frail. No distress. Breathing regular, unlabored. Abd flat.  ? ?Plan: ?- DNR remains until patient states otherwise - he plans to speak further with wife. RN Anderson Malta and Dr. Verlon Au updated to current situation and conversation. I will be available up until ~5pm for further discussion as desired. Dr. Verlon Au will follow up as well.  ?- I will be off service the next 2 days but will return Sunday. Please call [phone] for acute palliative needs over the next 2 days.  ? ?35 min ? ?Vinie Sill, NP ?Palliative Medicine Team ?Pager [phone] (Please see [email or web address] for schedule) ?Team Phone [phone]  ? ? ?Greater than 50%  of this time was spent counseling and coordinating care related to the above assessment and plan   ?

## 2021-07-03 NOTE — Progress Notes (Signed)
[place] Spiritual Care Note ? ?Visited briefly with Mr Gudiel today. He was awake but drowsy and had a hard time projecting his voice. He recognized me and smiled at the connection. Provided pastoral presence and care.  ? ?Also received call from his wife [name] to brainstorm about how to talk with key family members about his decline in a way that honors him and their relationships. Processed and strategized together to help her come to words about what she wants to convey. ? ?Also let [name] know that I will be available until ca 4:30 today and will follow up tomorrow. [name] may wish to page a chaplain ([facility] overnight chaplain or me this afternoon) to be present to support a family visit. Pager numbers are listed below. Thank you! ? ? ?Chaplain Lorrin Jackson, MDiv, [facility] ?[place] Pager [phone] ?[facility] 24/7 Pager: [phone] ?Voicemail [phone]  ?

## 2021-07-04 ENCOUNTER — Telehealth: Payer: Self-pay | Admitting: Pharmacist

## 2021-07-04 DIAGNOSIS — Z515 Encounter for palliative care: Secondary | ICD-10-CM | POA: Diagnosis not present

## 2021-07-04 DIAGNOSIS — Z7189 Other specified counseling: Secondary | ICD-10-CM | POA: Diagnosis not present

## 2021-07-04 DIAGNOSIS — R531 Weakness: Secondary | ICD-10-CM | POA: Diagnosis not present

## 2021-07-04 DIAGNOSIS — C652 Malignant neoplasm of left renal pelvis: Secondary | ICD-10-CM | POA: Diagnosis not present

## 2021-07-04 DIAGNOSIS — I4892 Unspecified atrial flutter: Secondary | ICD-10-CM | POA: Diagnosis not present

## 2021-07-04 LAB — CBC WITH DIFFERENTIAL/PLATELET
Abs Immature Granulocytes: 0.07 10*3/uL (ref 0.00–0.07)
Basophils Absolute: 0 10*3/uL (ref 0.0–0.1)
Basophils Relative: 0 %
Eosinophils Absolute: 0 10*3/uL (ref 0.0–0.5)
Eosinophils Relative: 0 %
HCT: 22.3 % — ABNORMAL LOW (ref 39.0–52.0)
Hemoglobin: 7.1 g/dL — ABNORMAL LOW (ref 13.0–17.0)
Immature Granulocytes: 1 %
Lymphocytes Relative: 8 %
Lymphs Abs: 0.9 10*3/uL (ref 0.7–4.0)
MCH: 29.7 pg (ref 26.0–34.0)
MCHC: 31.8 g/dL (ref 30.0–36.0)
MCV: 93.3 fL (ref 80.0–100.0)
Monocytes Absolute: 1 10*3/uL (ref 0.1–1.0)
Monocytes Relative: 9 %
Neutro Abs: 9.6 10*3/uL — ABNORMAL HIGH (ref 1.7–7.7)
Neutrophils Relative %: 82 %
Platelets: 144 10*3/uL — ABNORMAL LOW (ref 150–400)
RBC: 2.39 MIL/uL — ABNORMAL LOW (ref 4.22–5.81)
RDW: 17.5 % — ABNORMAL HIGH (ref 11.5–15.5)
WBC: 11.6 10*3/uL — ABNORMAL HIGH (ref 4.0–10.5)
nRBC: 0 % (ref 0.0–0.2)

## 2021-07-04 LAB — GLUCOSE, CAPILLARY: Glucose-Capillary: 95 mg/dL (ref 70–99)

## 2021-07-04 NOTE — Progress Notes (Signed)
?   07/04/21 1202  ?Assess: MEWS Score  ?Pulse Rate (!) 128  ?Resp 18  ?SpO2 96 %  ?O2 Device Room Air  ?Assess: MEWS Score  ?MEWS Temp 0  ?MEWS Systolic 0  ?MEWS Pulse 2  ?MEWS RR 0  ?MEWS LOC 0  ?MEWS Score 2  ?MEWS Score Color Yellow  ?Assess: if the MEWS score is Yellow or Red  ?Were vital signs taken at a resting state? Yes  ?Focused Assessment No change from prior assessment  ?Does the patient meet 2 or more of the SIRS criteria? No  ?MEWS guidelines implemented *See Row Information* Yes  ?Notify: Charge Nurse/RN  ?Name of Charge Nurse/RN Notified [place] RN  ?Date Charge Nurse/RN Notified 07/04/21  ?Time Charge Nurse/RN Notified 1232  ?Notify: Provider  ?Provider Name/Title Dr. Verlon Au  ?Date Provider Notified 07/04/21  ?Time Provider Notified 1233  ?Notification Type Page  ?Notification Reason Other (Comment) ?(yellow mews)  ?Provider response No new orders  ?Date of Provider Response 07/04/21  ?Document  ?Patient Outcome Other (Comment) ?(stable)  ?Progress note created (see row info) Yes  ?Assess: SIRS CRITERIA  ?SIRS Temperature  0  ?SIRS Pulse 1  ?SIRS Respirations  0  ?SIRS WBC 0  ?SIRS Score Sum  1  ? ?No changes previous yellow MEWS.   ?

## 2021-07-04 NOTE — Chronic Care Management (AMB) (Signed)
? ? ?  Chronic Care Management ?Pharmacy Assistant  ? ?Name: ABDULLOH Hall  MRN: [id number] DOB: 06-07-1945 ? ?07/07/2021 APPOINTMENT REMINDER with Jeni Salles, Clinical Pharmacy. ?Patient is currently inpatient and [facility]   ? ? ?Lawrence Hall CMA  ?Clinical Pharmacist Assistant ?[phone] ? ?

## 2021-07-04 NOTE — Progress Notes (Signed)
Events during this hospitalization is noted.  Patient well-known to me with end-stage bladder cancer who is not a candidate for any additional treatment.  I agree with current management and DNR CODE STATUS.  This was reiterated to the patient today which he appears to be understanding and agreeable with this plan.  He understand he has limited life expectancy given his cancer and overwhelming other comorbid conditions.  He is quite debilitated and I dissipate continues rapid decline and imminent death in the near future. ?

## 2021-07-04 NOTE — TOC Progression Note (Signed)
Transition of Care (TOC) - Progression Note  ? ? ?Patient Details  ?Name: AXIEL FJELD ?MRN: [id number] ?Date of Birth: 1946-03-24 ? ?Transition of Care (TOC) CM/SW Contact  ?Dessa Phi, RN ?Phone Number: ?07/04/2021, 10:39 AM ? ?Clinical Narrative: Ongoing discussions w/palliative care-await recc.   ? ? ? ?Expected Discharge Plan:  (TBD) ?Barriers to Discharge: Continued Medical Work up ? ?Expected Discharge Plan and Services ?Expected Discharge Plan:  (TBD) ?  ?Discharge Planning Services: CM Consult ?  ?Living arrangements for the past 2 months: [place] ?                ?  ?  ?  ?  ?  ?  ?  ?  ?  ?  ? ? ?Social Determinants of Health (SDOH) Interventions ?  ? ?Readmission Risk Interventions ?Readmission Risk Prevention Plan 02/22/2021  ?Transportation Screening Complete  ?Medication Review Press photographer) Complete  ?PCP or Specialist appointment within 3-5 days of discharge Complete  ?[place] or Home Care Consult Complete  ?SW Recovery Care/Counseling Consult Complete  ?Palliative Care Screening Complete  ?[place] Not Applicable  ?Some recent data might be hidden  ? ? ?

## 2021-07-04 NOTE — Progress Notes (Signed)
Physical Therapy Treatment ?Patient Details ?Name: Lawrence Hall ?MRN: [id number] ?DOB: 1945-12-09 ?Today's Date: 07/04/2021 ? ? ?History of Present Illness Lawrence Hall is a 76 y.o. male who has been very weak and deconditioned, poor mobility due to generalized weakness, poor balance and failure to thrive.  At the infusion center he was noted to have a rapid heart rate with irregular rhythm and he was transferred to the ED and admitted 06/24/21 with A flutter. 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 Hypertension, dyslipidemia, paroxysmal atrial fibrillation, coronary artery disease sp CABG, and bioprosthetic aortic valve replacement ? ?  ?PT Comments  ? ? Pt is declining in function and not able to tolerate mobility today. Pt reported he did not feel well or want to participate, but he did say he wanted help scooting up in the bed. Pt was Total Assist +2 to slide up to [facility]. Pt is very weak and kept his eyes closed most of the time. HR in the 120s with bed mobility. Prognosis is poor and pt may benefit from comfort care and discontinuing acute skilled PT. Will follow-up at a later date to determine pt and family goals and if they would like to discontinue therapy.  ?Recommendations for follow up therapy are one component of a multi-disciplinary discharge planning process, led by the attending physician.  Recommendations may be updated based on patient status, additional functional criteria and insurance authorization. ? ?Follow Up Recommendations ? Skilled nursing-short term rehab (<3 hours/day) ?  ?  ?Assistance Recommended at Discharge Frequent or constant Supervision/Assistance  ?Patient can return home with the following Assistance with cooking/housework;Direct supervision/assist for financial management;Assist for transportation;Two people to help with walking and/or  transfers;A lot of help with bathing/dressing/bathroom;Help with stairs or ramp for entrance;Direct supervision/assist for medications management ?  ?Equipment Recommendations ? None recommended by PT  ?  ?Recommendations for Other Services   ? ? ?  ?Precautions / Restrictions Precautions ?Precautions: Fall ?Precaution Comments: orthostatic, elevated HR ?Restrictions ?Weight Bearing Restrictions: No  ?  ? ?Mobility ? Bed Mobility ?Overal bed mobility: Needs Assistance ?Bed Mobility:  (scoot up to [facility]) ?  ?  ?  ?  ?  ?General bed mobility comments: total assist +2 pt unable to assist sliding up to [facility] ?  ? ?Transfers ?  ?  ?  ?  ?  ?  ?  ?  ?  ?General transfer comment: Pt unable ?  ? ?Ambulation/Gait ?  ?  ?  ?  ?  ?  ?  ?General Gait Details: unable ? ? ?Stairs ?  ?  ?  ?  ?  ? ? ?Wheelchair Mobility ?  ? ?Modified Rankin (Stroke Patients Only) ?  ? ? ?  ?Balance   ?  ?  ?  ?  ?  ?  ?  ?  ?  ?  ?  ?  ?  ?  ?  ?  ?  ?  ?  ? ?  ?Cognition Arousal/Alertness: Lethargic ?Behavior During Therapy: Flat affect ?Overall Cognitive Status: No family/caregiver present to determine baseline cognitive functioning ?  ?  ?  ?  ?  ?  ?  ?  ?  ?  ?  ?  ?  ?  ?  ?  ?General Comments: Pt sleeping, declined therapy, but wanted me to assist him to scoot  up in the bed. ?  ?  ? ?  ?Exercises   ? ?  ?General Comments   ?  ?  ? ?Pertinent Vitals/Pain Pain Assessment ?Pain Assessment: Faces ?Faces Pain Scale: Hurts a little bit ?Pain Location: unclear, not able to communicate-grimmacing with bed mobility ?Pain Intervention(s): Monitored during session, Repositioned  ? ? ?Home Living   ?  ?  ?  ?  ?  ?  ?  ?  ?  ?   ?  ?Prior Function    ?  ?  ?   ? ?PT Goals (current goals can now be found in the care plan section) Progress towards PT goals: Not progressing toward goals - comment ? ?  ?Frequency ? ? ? Min 2X/week ? ? ? ?  ?PT Plan Current plan remains appropriate  ? ? ?Co-evaluation   ?  ?  ?  ?  ? ?  ?AM-PAC PT "6 Clicks" Mobility    ?Outcome Measure ? Help needed turning from your back to your side while in a flat bed without using bedrails?: Total ?Help needed moving from lying on your back to sitting on the side of a flat bed without using bedrails?: Total ?Help needed moving to and from a bed to a chair (including a wheelchair)?: Total ?Help needed standing up from a chair using your arms (e.g., wheelchair or bedside chair)?: Total ?Help needed to walk in hospital room?: Total ?Help needed climbing 3-5 steps with a railing? : Total ?6 Click Score: 6 ? ?  ?End of Session   ?Activity Tolerance: Patient limited by fatigue;Treatment limited secondary to medical complications (Comment) ?Patient left: in bed;with bed alarm set ?Nurse Communication: Other (comment) (bed level) ?PT Visit Diagnosis: Difficulty in walking, not elsewhere classified (R26.2);Muscle weakness (generalized) (M62.81);Other abnormalities of gait and mobility (R26.89) ?  ? ? ?Time: 6546-5035 ?PT Time Calculation (min) (ACUTE ONLY): 10 min ? ?Charges:  $Therapeutic Activity: 8-22 mins          ?          ? ? ? ?Lelon Mast ?07/04/2021, 12:11 PM ? ?

## 2021-07-04 NOTE — Plan of Care (Signed)

## 2021-07-04 NOTE — Care Management Important Message (Signed)
Important Message ? ?Patient Details IM Letter placed in Patients room. ?Name: Lawrence Hall ?MRN: [id number] ?Date of Birth: 1945/05/06 ? ? ?Medicare Important Message Given:  Yes ? ? ? ? ?Kerin Salen ?07/04/2021, 1:34 PM ?

## 2021-07-04 NOTE — Progress Notes (Signed)
?Progress Note ? ? ?Patient: Lawrence Hall HQI:[id number] DOB: 1945-06-14 DOA: 06/24/2021     10 ?DOS: the patient was seen and examined on 07/04/2021 ?  ?Brief hospital course: ?76 y.o. 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 Hypertension, dyslipidemia, paroxysmal atrial fibrillation, coronary artery disease sp CABG, and bioprosthetic aortic valve replacement.  ?  ?Patient has been very weak and deconditioned, poor mobility due to generalized weakness, poor balance and failure to thrive. He has been treated with weekly IV fluids for hydration per Dr Alen Blew as part of palliative care.  ?Before getting to the infusion center he felt palpitations, moderate in intensity, associated with dyspnea but not chest pain, no improving or worsening factors. ?At the infusion center he was noted to have a rapid heart rate with irregular rhythm and he was transferred to the ED for further evaluation.  ? ?He was admitted and stabilized and currently palliative care is discussing goals of care in terms of plans for end-of-life with him and his family ? ?Assessment and Plan: ? ?Atrial flutter CHADS2 score >4 ?Continue metoprolol 50 twice daily, p.o. Cardizem (was on Cardizem gtt.)-as needed labetalol IV for intermittent tachycardia  ?No anticoagulation --given history of bleeding ?I would discontinue monitors if the goals are aligning towards comfort-I will stop checking labs from today onwards ?Euvolemic hyponatremia ?Improved--stable in 130 range-stop checking labs ?Possible pneumonia since 3/13 ?Patient was on azithromycin and ceftriaxone-we will narrow to doxycycline for 1 more day and then stop Rx ?High-grade urothelial stage IV CA with hepatic and bony mets ?Outpatient IV fluids being given q. Weekly ?Chronic anemia ?now DNR-see detailed discussion by Ms. Jerline Pain. ?consolidated this discussion with patient  and wife at the bedside 3/15 ?metastatic disease is evidenced by elevations of LFTs ?Appreciative of Dr. Alen Blew and Dr. Rowe Pavy input ?Have requested case management discussed with family choice of hospice ?CABG 2020 ?HFrEF this admission 40% orthostatic hypotension ?Cardiology evaluated patient he is not a candidate for invasive work-up ?Depression ?Depressed but much more awake with discontinuation orf Trazadone/Remeron ?Consider discontinuation of Megace ? ?  ? ?Subjective: ? ?Much more awake cogent a ?Him and his wife recall the conversation with Dr. Rowe Pavy palliative care ?They are appreciative of Dr. Hazeline Junker input in addition ?I have asked that they start thinking about the next steps in terms of freestanding hospice facility placement ? ? ?Physical Exam: ?Vitals:  ? 07/04/21 0200 07/04/21 0400 07/04/21 0600 07/04/21 1202  ?BP: 112/60 110/72 106/76   ?Pulse: 62 67 93 (!) 128  ?Resp: '14 18 18 18  '$ ?Temp:   98.5 ?F (36.9 ?C)   ?TempSrc:   Oral   ?SpO2: 97% 98% 97% 96%  ?Weight:      ?Height:      ? ?Cachectic slightly sleepy white male ?Chest clear ? ?Data Reviewed: ? ?CMP Latest Ref Rng & Units 07/03/2021 07/02/2021 07/01/2021  ?Glucose 70 - 99 mg/dL 85 97 81  ?BUN 8 - 23 mg/dL 26(H) 21 17  ?Creatinine 0.61 - 1.24 mg/dL 1.23 1.10 1.07  ?Sodium 135 - 145 mmol/L 130(L) 131(L) 131(L)  ?Potassium 3.5 - 5.1 mmol/L 4.0 4.1 4.0  ?Chloride 98 - 111 mmol/L 103 103 102  ?CO2 22 - 32 mmol/L '22 22 22  '$ ?Calcium 8.9 - 10.3 mg/dL 9.6 9.7 9.3  ?Total Protein 6.5 - 8.1 g/dL 5.1(L) 5.1(L) 5.1(L)  ?Total Bilirubin 0.3 - 1.2 mg/dL 2.1(H) 2.0(H)  2.0(H)  ?Alkaline Phos 38 - 126 U/L 267(H) 273(H) 260(H)  ?AST 15 - 41 U/L 69(H) 65(H) 55(H)  ?ALT 0 - 44 U/L 41 41 34  ? ?CBC ?   ?Component Value Date/Time  ? WBC 11.6 (H) 07/04/2021 0335  ? RBC 2.39 (L) 07/04/2021 0335  ? HGB 7.1 (L) 07/04/2021 0335  ? HGB 7.8 (L) 06/24/2021 1139  ? HCT 22.3 (L) 07/04/2021 0335  ? PLT 144 (L) 07/04/2021 0335  ? PLT 203 06/24/2021 1139  ? MCV 93.3 07/04/2021  0335  ? MCH 29.7 07/04/2021 0335  ? MCHC 31.8 07/04/2021 0335  ? RDW 17.5 (H) 07/04/2021 0335  ? LYMPHSABS 0.9 07/04/2021 0335  ? MONOABS 1.0 07/04/2021 0335  ? EOSABS 0.0 07/04/2021 0335  ? BASOSABS 0.0 07/04/2021 0335  ? ? ? ?Family Communication: Discussed in detail with the wife at the bedside ? ?Disposition: ?Status is: Inpatient ?Remains inpatient appropriate because: Severity of illness ? Planned Discharge Destination: Home with Home Health ? ? ? ?Author: ?Nita Sells, MD ?07/04/2021 1:40 PM ? ?For on call review [email or web address]  ?

## 2021-07-04 NOTE — Progress Notes (Signed)
? ?                                                                                                                                                     ?                                                   ?Daily Progress Note  ? ?Patient Name: Lawrence Hall       Date: 07/04/2021 ?DOB: 24-Jun-1945  Age: 76 y.o. MRN#: [id number] ?Attending Physician: Nita Sells, MD ?Primary Care Physician: [name], MD ?Admit Date: 06/24/2021 ? ?Reason for Consultation/Follow-up: Establishing goals of care ? ?Subjective: ? Resting in bed, appears weak and chronically ill. Is very slow to speak, has ongoing decline and debility. He is able to discuss and understand his current condition and prognosis. We spent some time talking today about his cancer, his goals and wishes and what is important to him at this stage. I then placed a call and discussed with his wife as well, see below.  ? ?Length of Stay: 10 ? ?Current Medications: ?Scheduled Meds:  ? aspirin EC  81 mg Oral QHS  ? atorvastatin  80 mg Oral q1800  ? Chlorhexidine Gluconate Cloth  6 each Topical Daily  ? enoxaparin (LOVENOX) injection  40 mg Subcutaneous Q24H  ? feeding supplement  1 Container Oral BID BM  ? feeding supplement  237 mL Oral TID BM  ? megestrol  400 mg Oral Daily  ? metoprolol succinate  100 mg Oral Daily  ? multivitamin with minerals  1 tablet Oral Daily  ? polyethylene glycol  17 g Oral BID  ? sodium chloride flush  10-40 mL Intracatheter Q12H  ? ? ?Continuous Infusions: ? azithromycin 500 mg (07/03/21 1340)  ? cefTRIAXone (ROCEPHIN)  IV 2 g (07/03/21 1217)  ? ? ?PRN Meds: ?acetaminophen **OR** acetaminophen, bisacodyl, labetalol, ondansetron **OR** ondansetron (ZOFRAN) IV, oxyCODONE, sodium chloride flush ? ?Physical Exam         ?Speaks very slowly, appears with generalized weakness ?Awake, alert, oriented.  ?Pale. Thin, frail.  ?No distress. Breathing regular, unlabored.  ?Abd flat.  ?  ?  ?Vital Signs: BP 106/76   Pulse 93   Temp 98.5 ?F  (36.9 ?C) (Oral)   Resp 18   Ht 6' (1.829 m)   Wt 66.1 kg   SpO2 97%   BMI 19.76 kg/m?  ?SpO2: SpO2: 97 % ?O2 Device: O2 Device: Room Air ?O2 Flow Rate:   ? ?Intake/output summary:  ?Intake/Output Summary (Last 24 hours) at 07/04/2021 1145 ?Last data filed at 07/04/2021 0600 ?Gross per 24 hour  ?Intake 530 ml  ?Output 250 ml  ?  Net 280 ml  ? ?LBM: Last BM Date : 07/03/21 ?Baseline Weight: Weight: 68 kg ?Most recent weight: Weight: 66.1 kg ? ?     ?Palliative Assessment/Data: ? ? ? ? ? ?Patient Active Problem List  ? Diagnosis Date Noted  ? Pressure injury of skin 07/01/2021  ? Pneumonia 06/30/2021  ? Orthostatic hypotension 06/25/2021  ? Atrial flutter ([place]) 06/24/2021  ? Chronic anemia 06/24/2021  ? Depression 06/24/2021  ? Hyperbilirubinemia 06/24/2021  ? Protein-calorie malnutrition, severe 02/17/2021  ? Hypokalemia 02/17/2021  ? Weakness   ? Dehydration   ? Syncope and collapse   ? Syncope 02/15/2021  ? Nausea and vomiting 01/24/2021  ? Intractable vomiting with nausea 01/23/2021  ? Anemia 01/23/2021  ? Hyponatremia 01/23/2021  ? Metastasis to bone ([place]) 10/30/2020  ? Goals of care, counseling/discussion 09/06/2020  ? Malignant tumor of renal pelvis, left ([place]) 01/04/2020  ? Port-A-Cath in place 11/02/2019  ? Right rotator cuff tear 09/19/2019  ? History of aortic valve replacement with bioprosthetic valve 05/18/2018  ? Postoperative atrial fibrillation ([place]) 05/18/2018  ? LBBB (left bundle branch block) 05/18/2018  ? S/P CABG x 3 04/29/2018  ? CAD (coronary artery disease), native coronary artery 04/26/2018  ? Near syncope   ? Nonrheumatic aortic valve stenosis   ? Non-STEMI (non-ST elevated myocardial infarction) ([place])   ? Essential hypertension 06/01/2015  ? Asthma 05/31/2015  ? Elevated troponin 05/31/2015  ? Arthritis   ? Hyperlipidemia   ? ED (erectile dysfunction)   ? ? ?Palliative Care Assessment & Plan  ? ?Patient Profile: ?  ? ?Assessment: ? 76 y.o. male  with past medical history of stage IV  urothelial cancer mets to liver and bone (no current treatment), renal cell cancer 2021, hypertension, paroxysmal atrial fibrillation, [name], bioprosthetic aortic placement admitted from cancer center infusion center on 06/24/2021 with palpitations and dyspnea related to atrial fibrillation RVR with dehydration.    ?  ? ?Recommendations/Plan: ?I discussed with the patient this morning about his goals and wishes.  CODE STATUS discussions also undertaken.  Asked him about what is important to him at this stage.  We discussed about the differences between full code versus DO NOT RESUSCITATE.  Patient understands and wishes to continue with DNR.  He talks about staying positive and being positive.  He states that him and his wife have never given up on each other. ?Call placed and discussed with his wife.  Brief life review performed.  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  States that she has discussed with the patient's sister about the serious nature of his condition and his ongoing decline.  Patient has a niece home he has helped raise as his daughter and is very close to her.  Discussed frankly but compassionately with the patient's wife about patient's high risk for ongoing decline and decompensation given his overall medical condition.  Discussed about appropriateness of continuing with DNR.  Patient's wife asks what happens next.  Discussed with her about what residential hospice is all about.  She asked for continuation of current interventions such as cardiac medications and antibiotics for the next day or 2 until his sister and niece can come visit.  I did discuss with her about the type of care that can be given in a local hospice facility such as [facility].  She asks that we follow-up after 1 to 2 days. ?  ?Code Status: ? ?  ?  Code Status Orders  ?(From admission, onward)  ?  ? ? ?  ? ?  Start     Ordered  ? 07/02/21 1338  Do not attempt  resuscitation (DNR)  Continuous       ?Question Answer Comment  ?In the event of cardiac or respiratory ARREST Do not call a ?code blue?   ?In the event of cardiac or respiratory ARREST Do not perform Intubation, CPR, defibrillation or ACLS   ?In the event of cardiac or respiratory ARREST Use medication by any route, position, wound care, and other measures to relive pain and suffering. May use oxygen, suction and manual treatment of airway obstruction as needed for comfort.   ?  ? 07/02/21 1337  ? ?  ?  ? ?  ? ?Code Status History   ? ? Date Active Date Inactive Code Status Order ID Comments User Context  ? 06/24/2021 1737 07/02/2021 1337 Full Code [id number]  Tawni Millers, MD Inpatient  ? 02/15/2021 2020 02/22/2021 2333 Full Code [id number]  Lenore Cordia, MD ED  ? 01/23/2021 1555 01/26/2021 1733 Full Code [id number]  Reubin Milan, MD ED  ? 01/04/2020 1421 01/06/2020 1933 Full Code [id number]  Raynelle Bring, MD Inpatient  ? 04/26/2018 1757 05/07/2018 1700 Full Code [id number]  [name] Sine, MD Inpatient  ? 05/31/2015 2139 06/04/2015 1608 Full Code [id number]  Reubin Milan, MD Inpatient  ? ?  ? ? ?Prognosis: ? Guarded, could be less than 2 weeks.  ? ?Discharge Planning: ?To Be Determined but we have started to discuss about residential hospice.  ? ?Care plan was discussed with  patient, wife, inter disciplinary team.  ? ?Thank you for allowing the Palliative Medicine Team to assist in the care of this patient. ? ? ?Time In: 11 Time Out: 11.45 Total Time 45 Prolonged Time Billed  no   ? ?   ?Greater than 50%  of this time was spent counseling and coordinating care related to the above assessment and plan. ? ?Loistine Chance, MD ? ?Please contact Palliative Medicine Team phone at [phone] for questions and concerns.  ? ? ? ? ? ?

## 2021-07-05 LAB — GLUCOSE, CAPILLARY
Glucose-Capillary: 128 mg/dL — ABNORMAL HIGH (ref 70–99)
Glucose-Capillary: 89 mg/dL (ref 70–99)
Glucose-Capillary: 104 mg/dL — ABNORMAL HIGH (ref 70–99)
Glucose-Capillary: 114 mg/dL — ABNORMAL HIGH (ref 70–99)
Glucose-Capillary: 102 mg/dL — ABNORMAL HIGH (ref 70–99)

## 2021-07-05 LAB — CULTURE, BLOOD (ROUTINE X 2)
Culture: NO GROWTH
Special Requests: ADEQUATE

## 2021-07-05 MED ORDER — MORPHINE SULFATE (PF) 2 MG/ML IV SOLN
1.0000 mg | INTRAVENOUS | Status: DC | PRN
  Administered 2021-07-07: 1 mg via INTRAVENOUS
  Filled 2021-07-05: qty 1

## 2021-07-05 NOTE — Progress Notes (Signed)
?Progress Note ? ? ?Patient: Lawrence Hall JQB:[id number] DOB: Aug 17, 1945 DOA: 06/24/2021     11 ?DOS: the patient was seen and examined on 07/05/2021 ?  ?Brief hospital course: ?76 y.o. 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 Hypertension, dyslipidemia, paroxysmal atrial fibrillation, coronary artery disease sp CABG, and bioprosthetic aortic valve replacement.  ?  ?Patient has been very weak and deconditioned, poor mobility due to generalized weakness, poor balance and failure to thrive. He has been treated with weekly IV fluids for hydration per Dr Alen Blew as part of palliative care.  ?Before getting to the infusion center he felt palpitations, moderate in intensity, associated with dyspnea but not chest pain, no improving or worsening factors. ?At the infusion center he was noted to have a rapid heart rate with irregular rhythm and he was transferred to the ED for further evaluation.  ? ?He was admitted and stabilized and currently palliative care is discussing goals of care in terms of plans for end-of-life with him and his family ? ?Assessment and Plan: ? ?Atrial flutter CHADS2 score >4 ?Continue metoprolol 50 twice daily, p.o. Cardizem (was on Cardizem gtt.)-as needed labetalol IV for intermittent tachycardia  ?No anticoagulation --given history of bleeding ?Telemetry was discontinued today ?Euvolemic hyponatremia ?Improved--stable in 130 range-stop checking labs ?Possible pneumonia since 3/13 ?Patient was on azithromycin and ceftriaxone-antibiotics discontinued 3/18 ?High-grade urothelial stage IV CA with hepatic and bony mets ?Outpatient IV fluids being given q. Weekly ?Chronic anemia ?now DNR-see detailed discussion by Ms. Jerline Pain. ?consolidated this discussion with patient and wife at the bedside 3/15 ?metastatic disease is evidenced by elevations of LFTs ?Appreciative of Dr. Alen Blew,  Dr. Rowe Pavy and Dr. Orene Desanctis input ?Patient is unable to clearly decide at this stage and has difficulty expressing what his thoughts are-I have to defer mainly to his wife who is quite torn in decision making-on the one hand she wishes to take him home but then realizes that she cannot turn him cannot toilet him and cannot care for him independently ?We discussed starting palliative treatments while in the hospital including low-dose morphine and Ativan-I have taken him off of monitors today-I do think that he is inpatient hospice eligible as he looks poorly is weak and is hardly eating and have asked that case management reach back out to the hospice facilities and reengage with them with regards to placement-I expect this will happen within the next 48 hours if they are willing to take him and I appreciate their assistance ?CABG 2020 ?HFrEF this admission 40% orthostatic hypotension ?Cardiology evaluated patient he is not a candidate for invasive work-up-greatly appreciate bedside visit on 3/18 by his primary cardiologist Dr. Orene Desanctis ?Depression ?Depressed but much more awake with discontinuation orf Trazadone/Remeron ?Consider discontinuation of Megace ? ?  ? ?Subjective: ? ?Awake but a little confused today-has hardly eaten ?Myself and wife had a 15-minute conversation about options for care and we consolidated this discussion with social work ? ? ?Physical Exam: ?Vitals:  ? 07/05/21 0700 07/05/21 0810 07/05/21 0900 07/05/21 1200  ?BP: 107/73 (!) 102/59 (!) 107/57 (!) 114/59  ?Pulse: (!) 127 70 74 90  ?Resp: (!) '7 12 14 16  '$ ?Temp:    97.9 ?F (36.6 ?C)  ?TempSrc:    Axillary  ?SpO2: 97% 99% 100% 100%  ?Weight:      ?Height:      ? ?Cachectic slightly sleepy white male ?Chest clear ?  No rales no rhonchi ?Dry mucosa ?Abdomen soft ?No lower extremity edema ? ?Data Reviewed: ? ?CMP Latest Ref Rng & Units 07/03/2021 07/02/2021 07/01/2021  ?Glucose 70 - 99 mg/dL 85 97 81  ?BUN 8 - 23 mg/dL 26(H) 21 17  ?Creatinine 0.61 -  1.24 mg/dL 1.23 1.10 1.07  ?Sodium 135 - 145 mmol/L 130(L) 131(L) 131(L)  ?Potassium 3.5 - 5.1 mmol/L 4.0 4.1 4.0  ?Chloride 98 - 111 mmol/L 103 103 102  ?CO2 22 - 32 mmol/L '22 22 22  '$ ?Calcium 8.9 - 10.3 mg/dL 9.6 9.7 9.3  ?Total Protein 6.5 - 8.1 g/dL 5.1(L) 5.1(L) 5.1(L)  ?Total Bilirubin 0.3 - 1.2 mg/dL 2.1(H) 2.0(H) 2.0(H)  ?Alkaline Phos 38 - 126 U/L 267(H) 273(H) 260(H)  ?AST 15 - 41 U/L 69(H) 65(H) 55(H)  ?ALT 0 - 44 U/L 41 41 34  ? ?CBC ?   ?Component Value Date/Time  ? WBC 11.6 (H) 07/04/2021 0335  ? RBC 2.39 (L) 07/04/2021 0335  ? HGB 7.1 (L) 07/04/2021 0335  ? HGB 7.8 (L) 06/24/2021 1139  ? HCT 22.3 (L) 07/04/2021 0335  ? PLT 144 (L) 07/04/2021 0335  ? PLT 203 06/24/2021 1139  ? MCV 93.3 07/04/2021 0335  ? MCH 29.7 07/04/2021 0335  ? MCHC 31.8 07/04/2021 0335  ? RDW 17.5 (H) 07/04/2021 0335  ? LYMPHSABS 0.9 07/04/2021 0335  ? MONOABS 1.0 07/04/2021 0335  ? EOSABS 0.0 07/04/2021 0335  ? BASOSABS 0.0 07/04/2021 0335  ? ? ? ?Family Communication: Discussed in detail with the wife at the bedside-see above note ? ?Disposition: ?Status is: Inpatient ?Remains inpatient appropriate because: Needs discussion about placement to home hospice freestanding hospice facility ? Planned Discharge Destination:  Likely will go to freestanding hospice ? ? ? ?Author: ?Nita Sells, MD ?07/05/2021 6:20 PM ? ?For on call review [email or web address].  ?

## 2021-07-05 NOTE — Progress Notes (Signed)
Occupational Therapy Treatment ?Patient Details ?Name: Lawrence Hall ?MRN: [id number] ?DOB: 1946-02-13 ?Today's Date: 07/05/2021 ? ? ?History of present illness Lawrence Hall is a 76 y.o. male who has been very weak and deconditioned, poor mobility due to generalized weakness, poor balance and failure to thrive.  At the infusion center he was noted to have a rapid heart rate with irregular rhythm and he was transferred to the ED and admitted 06/24/21 with A flutter. 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 Hypertension, dyslipidemia, paroxysmal atrial fibrillation, coronary artery disease sp CABG, and bioprosthetic aortic valve replacement ?  ?OT comments ? Patients session was limited today with patients level of fatigue. Patient and wife in room were educated on elevation of BUE to reduce edema and increase comfort. Patient and wife verbalized understanding. Patients pillows placed under BUE and adjusted under heels to reduce risks of pressure. Patient is very weak with increased fatigue with just vocalizations. Patient would benefit from comfort care. Patient will need 24/7 caregiver support in next level of care. Awaiting decisions on hospice services at this time.   ? ?Recommendations for follow up therapy are one component of a multi-disciplinary discharge planning process, led by the attending physician.  Recommendations may be updated based on patient status, additional functional criteria and insurance authorization. ?   ?Follow Up Recommendations ? Skilled nursing-short term rehab (<3 hours/day)  ?  ?Assistance Recommended at Discharge Frequent or constant Supervision/Assistance  ?Patient can return home with the following ? A lot of help with walking and/or transfers;A lot of help with bathing/dressing/bathroom;Assistance with cooking/housework;Help with stairs or  ramp for entrance ?  ?Equipment Recommendations ? Tub/shower seat  ?  ?Recommendations for Other Services   ? ?  ?Precautions / Restrictions Precautions ?Precautions: Fall ?Precaution Comments: orthostatic, elevated HR ?Restrictions ?Weight Bearing Restrictions: No  ? ? ?  ? ?Mobility Bed Mobility ?  ?  ?  ?  ?  ?  ?  ?  ?  ? ?Transfers ?  ?  ?  ?  ?  ?  ?  ?  ?  ?  ?  ?  ?Balance   ?  ?  ?  ?  ?  ?  ?  ?  ?  ?  ?  ?  ?  ?  ?  ?  ?  ?  ?   ? ?ADL either performed or assessed with clinical judgement  ? ?ADL   ?  ?  ?  ?  ?  ?  ?  ?  ?  ?  ?  ?  ?  ?  ?  ?  ?  ?  ?  ?  ?  ? ?Extremity/Trunk Assessment Upper Extremity Assessment ?RUE Deficits / Details: patient noted to have increased fluid in RUE with repositioned. ?LUE Deficits / Details: able to ROM LUE to scratch chin. noted to have edema around elbow ?  ?  ?  ?  ?  ? ?Vision   ?  ?  ?Perception   ?  ?Praxis   ?  ? ?Cognition Arousal/Alertness: Lethargic ?Behavior During Therapy: Flat affect ?Overall Cognitive Status: Difficult to assess ?  ?  ?  ?  ?  ?  ?  ?  ?  ?  ?  ?  ?  ?  ?  ?  ?General Comments:  wife present. voice so soft with fatigue noted to say name ?  ?  ?   ?Exercises   ? ?  ?Shoulder Instructions   ? ? ?  ?General Comments    ? ? ?Pertinent Vitals/ Pain       Pain Assessment ?Pain Assessment: Faces ?Faces Pain Scale: Hurts a little bit ?Pain Location: unclear grimance with all movement ?Pain Descriptors / Indicators: Grimacing, Discomfort ?Pain Intervention(s): Monitored during session, Repositioned ? ?Home Living   ?  ?  ?  ?  ?  ?  ?  ?  ?  ?  ?  ?  ?  ?  ?  ?  ?  ?  ? ?  ?Prior Functioning/Environment    ?  ?  ?  ?   ? ?Frequency ? Min 2X/week  ? ? ? ? ?  ?Progress Toward Goals ? ?OT Goals(current goals can now be found in the care plan section) ? Progress towards OT goals: Not progressing toward goals - comment (patient has had a huge decline with patient being evaluated for hospice) ? ?   ?Plan Discharge plan needs to be updated    ? ?Co-evaluation ? ? ?   ?  ?  ?  ?  ? ?  ?AM-PAC OT "6 Clicks" Daily Activity     ?Outcome Measure ? ? Help from another person eating meals?: A Lot ?Help from another person taking care of personal grooming?: A Lot ?Help from another person toileting, which includes using toliet, bedpan, or urinal?: Total ?Help from another person bathing (including washing, rinsing, drying)?: Total ?Help from another person to put on and taking off regular upper body clothing?: Total ?Help from another person to put on and taking off regular lower body clothing?: Total ?6 Click Score: 8 ? ?  ?End of Session   ? ?OT Visit Diagnosis: Muscle weakness (generalized) (M62.81);Adult, failure to thrive (R62.7) ?  ?Activity Tolerance Patient limited by lethargy;Patient limited by fatigue ?  ?Patient Left in bed;with call bell/phone within reach;with bed alarm set;with family/visitor present ?  ?Nurse Communication Other (comment) (cleared patient to participate) ?  ? ?   ? ?Time: 1150-1201 ?OT Time Calculation (min): 11 min ? ?Charges: OT General Charges ?$OT Visit: 1 Visit ?OT Treatments ?$Therapeutic Activity: 8-22 mins ? ?Anahla Bevis OTR/L, MS ?Acute Rehabilitation Department ?Office# [phone] ?Pager# [phone] ? ? ?[place] ?07/05/2021, 12:52 PM ?

## 2021-07-05 NOTE — Progress Notes (Signed)
? ?                                                                                                                                                     ?                                                   ?Daily Progress Note  ? ?Patient Name: Lawrence Hall       Date: 07/05/2021 ?DOB: 11-12-1945  Age: 76 y.o. MRN#: [id number] ?Attending Physician: Nita Sells, MD ?Primary Care Physician: [name], MD ?Admit Date: 06/24/2021 ? ?Reason for Consultation/Follow-up: Establishing goals of care ? ?Subjective: ? Resting in bed, appears weak and chronically ill. Is very slow to speak, has ongoing decline and debility. Wife at bedside.  ?   ? ?Length of Stay: 11 ? ?Current Medications: ?Scheduled Meds:  ? aspirin EC  81 mg Oral QHS  ? atorvastatin  80 mg Oral q1800  ? Chlorhexidine Gluconate Cloth  6 each Topical Daily  ? enoxaparin (LOVENOX) injection  40 mg Subcutaneous Q24H  ? feeding supplement  1 Container Oral BID BM  ? feeding supplement  237 mL Oral TID BM  ? megestrol  400 mg Oral Daily  ? metoprolol succinate  100 mg Oral Daily  ? multivitamin with minerals  1 tablet Oral Daily  ? polyethylene glycol  17 g Oral BID  ? sodium chloride flush  10-40 mL Intracatheter Q12H  ? ? ?Continuous Infusions: ? ? ? ?PRN Meds: ?acetaminophen **OR** acetaminophen, bisacodyl, labetalol, ondansetron **OR** ondansetron (ZOFRAN) IV, oxyCODONE, sodium chloride flush ? ?Physical Exam         ?Speaks very slowly, appears with generalized weakness ?Awake, alert, oriented.  ?Pale. Thin, frail.  ?No distress. Breathing regular, unlabored.  ?Abd flat.  ?  ?  ?Vital Signs: BP (!) 107/57   Pulse 74   Temp 98.9 ?F (37.2 ?C) (Oral)   Resp 14   Ht 6' (1.829 m)   Wt 66.1 kg   SpO2 100%   BMI 19.76 kg/m?  ?SpO2: SpO2: 100 % ?O2 Device: O2 Device: Room Air ?O2 Flow Rate:   ? ?Intake/output summary:  ?Intake/Output Summary (Last 24 hours) at 07/05/2021 1125 ?Last data filed at 07/05/2021 0715 ?Gross per 24 hour  ?Intake 360.63 ml   ?Output 200 ml  ?Net 160.63 ml  ? ? ?LBM: Last BM Date : 07/03/21 ?Baseline Weight: Weight: 68 kg ?Most recent weight: Weight: 66.1 kg ? ?     ?Palliative Assessment/Data: ? ? ? ? ? ?Patient Active Problem List  ? Diagnosis Date Noted  ? Pressure injury of skin 07/01/2021  ? Pneumonia 06/30/2021  ?  Orthostatic hypotension 06/25/2021  ? Atrial flutter ([place]) 06/24/2021  ? Chronic anemia 06/24/2021  ? Depression 06/24/2021  ? Hyperbilirubinemia 06/24/2021  ? Protein-calorie malnutrition, severe 02/17/2021  ? Hypokalemia 02/17/2021  ? Weakness   ? Dehydration   ? Syncope and collapse   ? Syncope 02/15/2021  ? Nausea and vomiting 01/24/2021  ? Intractable vomiting with nausea 01/23/2021  ? Anemia 01/23/2021  ? Hyponatremia 01/23/2021  ? Metastasis to bone ([place]) 10/30/2020  ? Goals of care, counseling/discussion 09/06/2020  ? Malignant tumor of renal pelvis, left ([place]) 01/04/2020  ? Port-A-Cath in place 11/02/2019  ? Right rotator cuff tear 09/19/2019  ? History of aortic valve replacement with bioprosthetic valve 05/18/2018  ? Postoperative atrial fibrillation ([place]) 05/18/2018  ? LBBB (left bundle branch block) 05/18/2018  ? S/P CABG x 3 04/29/2018  ? CAD (coronary artery disease), native coronary artery 04/26/2018  ? Near syncope   ? Nonrheumatic aortic valve stenosis   ? Non-STEMI (non-ST elevated myocardial infarction) ([place])   ? Essential hypertension 06/01/2015  ? Asthma 05/31/2015  ? Elevated troponin 05/31/2015  ? Arthritis   ? Hyperlipidemia   ? ED (erectile dysfunction)   ? ? ?Palliative Care Assessment & Plan  ? ?Patient Profile: ?  ? ?Assessment: ? 75 y.o. male  with past medical history of stage IV urothelial cancer mets to liver and bone (no current treatment), renal cell cancer 2021, hypertension, paroxysmal atrial fibrillation, [name], bioprosthetic aortic placement admitted from [facility] infusion center on 06/24/2021 with palpitations and dyspnea related to atrial fibrillation RVR with  dehydration.    ?  ? ?Recommendations/Plan: ?DNR ?Undergoing evaluation for hospice.  ?  ?  ?Code Status: ? ?  ?Code Status Orders  ?(From admission, onward)  ?  ? ? ?  ? ?  Start     Ordered  ? 07/02/21 1338  Do not attempt resuscitation (DNR)  Continuous       ?Question Answer Comment  ?In the event of cardiac or respiratory ARREST Do not call a ?code blue?   ?In the event of cardiac or respiratory ARREST Do not perform Intubation, CPR, defibrillation or ACLS   ?In the event of cardiac or respiratory ARREST Use medication by any route, position, wound care, and other measures to relive pain and suffering. May use oxygen, suction and manual treatment of airway obstruction as needed for comfort.   ?  ? 07/02/21 1337  ? ?  ?  ? ?  ? ?Code Status History   ? ? Date Active Date Inactive Code Status Order ID Comments User Context  ? 06/24/2021 1737 07/02/2021 1337 Full Code [id number]  Tawni Millers, MD Inpatient  ? 02/15/2021 2020 02/22/2021 2333 Full Code [id number]  Lenore Cordia, MD ED  ? 01/23/2021 1555 01/26/2021 1733 Full Code [id number]  Reubin Milan, MD ED  ? 01/04/2020 1421 01/06/2020 1933 Full Code [id number]  Raynelle Bring, MD Inpatient  ? 04/26/2018 1757 05/07/2018 1700 Full Code [id number]  [name] Sine, MD Inpatient  ? 05/31/2015 2139 06/04/2015 1608 Full Code [id number]  Reubin Milan, MD Inpatient  ? ?  ? ? ?Prognosis: ? Guarded, could be less than 2 weeks.  ? ?Discharge Planning: ?Likely for home with hospice.  ? ?Care plan was discussed with  patient, wife, inter disciplinary team.  ? ?Thank you for allowing the Palliative Medicine Team to assist in the care of this patient. ? ? ?Time In: 11 Time Out: 11.25 Total Time  25 Prolonged Time Billed  no   ? ?   ?Greater than 50%  of this time was spent counseling and coordinating care related to the above assessment and plan. ? ?Loistine Chance, MD ? ?Please contact Palliative Medicine Team phone at [phone] for questions and concerns.  ? ? ? ? ? ?

## 2021-07-05 NOTE — TOC Progression Note (Addendum)
Transition of Care (TOC) - Progression Note  ? ? ?Patient Details  ?Name: Lawrence Hall ?MRN: [id number] ?Date of Birth: [birth year] ? ?Transition of Care (TOC) CM/SW Contact  ?Eswin Worrell, Marta Lamas, LCSW ?Phone Number: ?07/05/2021, 10:24 AM ? ?Clinical Narrative:    ? ?Received order for Residential Hospice placement.  Wife is not interested in patient going to [facility], requesting Hospice Home in [place].  LCSW spoke with Fraser Din, Admissions Director of [facility], who reports no bed availability at the present time.  Patient has been placed on  waiting last.  Fraser Din will notify LCSW when bed is available. ? ?Addendum:   ? ?Return call received from [facility], Admissions Director of [facility], indicating that patient is not an appropriate candidate for residential hospice placement.  Pat further reported that patient is not receiving any type of symptom management, pain management, psychotropic medication management, or end-of-life care services.  Patient removed from waiting list and attending physician notified.  Fraser Din is willing to consider patient for home Hospice services, if patient and wife are agreeable.  Pat agreed to contact patient's wife to discuss further.  LCSW left HIPAA complaint messages on voicemail for wife, currently awaiting a return call. ? ?Addendum: ? ?Extensive conversation with patient's wife, as well as bedside nurse, to explain reasons why patient is not an appropriate candidate for residential hospice placement.  Discussed referral process for home hospice and/or palliative care services. ? ?Expected Discharge Plan:  (TBD) ?Barriers to Discharge: Continued Medical Work up ? ?Expected Discharge Plan and Services ?Expected Discharge Plan:  (TBD) ?  ?Discharge Planning Services: CM Consult ?  ?Living arrangements for the past 2 months: [place] ? ? ? ?Social Determinants of Health (SDOH) Interventions ?  ? ?Readmission Risk Interventions ?Readmission Risk  Prevention Plan 02/22/2021  ?Transportation Screening Complete  ?Medication Review Press photographer) Complete  ?PCP or Specialist appointment within 3-5 days of discharge Complete  ?[place] or Home Care Consult Complete  ?SW Recovery Care/Counseling Consult Complete  ?Palliative Care Screening Complete  ?[place] Not Applicable  ?Some recent data might be hidden  ? ? ?

## 2021-07-05 NOTE — Progress Notes (Signed)
Visited today with Lawrence Hall and his wife. ?He appears very weak and has trouble communicating. ?They are trying to make a decision regarding end of life care. Answered questions as best as I could. Difficult situation. ?Greatly appreciate Dr. Arlyss Queen expert medical care and I agree that only palliative care is appropriate at this point. ?

## 2021-07-06 DIAGNOSIS — Z515 Encounter for palliative care: Secondary | ICD-10-CM

## 2021-07-06 DIAGNOSIS — R531 Weakness: Secondary | ICD-10-CM

## 2021-07-06 LAB — GLUCOSE, CAPILLARY
Glucose-Capillary: 97 mg/dL (ref 70–99)
Glucose-Capillary: 92 mg/dL (ref 70–99)
Glucose-Capillary: 89 mg/dL (ref 70–99)

## 2021-07-06 MED ORDER — LORAZEPAM 0.5 MG PO TABS: 0.5000 mg | ORAL_TABLET | ORAL | Status: DC | PRN

## 2021-07-06 NOTE — TOC Progression Note (Signed)
Transition of Care (TOC) - Progression Note  ? ? ?Patient Details  ?Name: Lawrence Hall ?MRN: [id number] ?Date of Birth: 19-Oct-1945 ? ?Transition of Care (TOC) CM/SW Contact  ?Ludwig Clarks, LCSW ?Phone Number: ?07/06/2021, 3:53 PM ? ?Clinical Narrative:    ? ?CSW received call from [place] and are hopeful for bed in their residential/hospice home tomorrow. ? ? ? ?Expected Discharge Plan:  (TBD) ?Barriers to Discharge: Continued Medical Work up ? ?Expected Discharge Plan and Services ?Expected Discharge Plan:  (TBD) ?  ?Discharge Planning Services: CM Consult ?  ?Living arrangements for the past 2 months: [place] ?                ?  ?  ?  ?  ?  ?  ?  ?  ?  ?  ? ? ?Social Determinants of Health (SDOH) Interventions ?  ? ?Readmission Risk Interventions ?Readmission Risk Prevention Plan 02/22/2021  ?Transportation Screening Complete  ?Medication Review Press photographer) Complete  ?PCP or Specialist appointment within 3-5 days of discharge Complete  ?[place] or Home Care Consult Complete  ?SW Recovery Care/Counseling Consult Complete  ?Palliative Care Screening Complete  ?[place] Not Applicable  ?Some recent data might be hidden  ? ? ?

## 2021-07-06 NOTE — Progress Notes (Signed)
Patient awake and alert. Smiling and looking at sister and wife at bedside. Able to take metoprolol in applesauce without difficulty. Family remains at bedside.  ?

## 2021-07-06 NOTE — Progress Notes (Addendum)
?Progress Note ? ? ?Patient: Lawrence Hall HUD:[id number] DOB: Dec 26, 1945 DOA: 06/24/2021     12 ?DOS: the patient was seen and examined on 07/06/2021 ?  ?Brief hospital course: ?76 y.o. 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 Hypertension, dyslipidemia, paroxysmal atrial fibrillation, coronary artery disease sp CABG, and bioprosthetic aortic valve replacement.  ?  ?Patient has been very weak and deconditioned, poor mobility due to generalized weakness, poor balance and failure to thrive. He has been treated with weekly IV fluids for hydration per Dr Alen Blew as part of palliative care.  ?Before getting to the infusion center he felt palpitations, moderate in intensity, associated with dyspnea but not chest pain, no improving or worsening factors. ?At the infusion center he was noted to have a rapid heart rate with irregular rhythm and he was transferred to the ED for further evaluation.  ? ?He was admitted and stabilized and currently palliative care is discussing goals of care in terms of plans for end-of-life with him and his family ? ?Assessment and Plan: ? ?Atrial flutter CHADS2 score >4 ?Continue metoprolol 50 twice daily, p.o. Cardizem (was on Cardizem gtt.)-as needed labetalol IV for intermittent tachycardia  ?No anticoagulation --given history of bleeding ?Telemetry was discontinued 3/18 ?Euvolemic hyponatremia ?Improved--stable in 130 range-stop checking labs ?Possible pneumonia since 3/13 ?Patient was on azithromycin and ceftriaxone-antibiotics discontinued 3/18 ?It is not expected that this patient will survive beyond 2-4 weeks ?High-grade urothelial stage IV CA with hepatic and bony mets ?Outpatient IV fluids being given q. Weekly ?Chronic anemia ?now DNR-see detailed discussion by Ms. Jerline Pain. ?consolidated this discussion with patient and wife at the bedside 3/15 ?metastatic  disease is evidenced by elevations of LFTs ?Appreciative of Dr. Alen Blew, Dr. Rowe Pavy and Dr. Orene Desanctis input ?Patient has intermittent confusion-wife who is surrogate decision maker has decided on freestanding hospice if available ?CABG 2020 ?HFrEF this admission 40% orthostatic hypotension ?Cardiology evaluated patient he is not a candidate for invasive work-up-greatly appreciate bedside visit on 3/18 by his primary cardiologist Dr. Orene Desanctis ?Depression ?Depressed but much more awake with discontinuation orf Trazadone/Remeron ?Consider discontinuation of Megace ? ?  ? ?Subjective: ? ?More awake today than yesterday ?No pain no fever ?"Why do you tell me every time you see me that I am dying" ?Wife is at the bedside she expresses some concerns about his pneumonia-I have explained to her that this process will take some time and that we will reach back out to case management with regards to hospice placement ? ? ?Physical Exam: ?Vitals:  ? 07/05/21 0900 07/05/21 1200 07/05/21 2225 07/06/21 0445  ?BP: (!) 107/57 (!) 114/59 (!) 143/78 124/76  ?Pulse: 74 90 63 86  ?Resp: '14 16 20 18  '$ ?Temp:  97.9 ?F (36.6 ?C) 98.2 ?F (36.8 ?C) 98 ?F (36.7 ?C)  ?TempSrc:  Axillary Oral Oral  ?SpO2: 100% 100% 91% 100%  ?Weight:      ?Height:      ? ?Cachectic more awake no distress ?No icterus no pallor ?Chest clear ?No rales no rhonchi ?Dry mucosa ?Abdomen soft no rebound ? ?Data Reviewed: ? ?CMP Latest Ref Rng & Units 07/03/2021 07/02/2021 07/01/2021  ?Glucose 70 - 99 mg/dL 85 97 81  ?BUN 8 - 23 mg/dL 26(H) 21 17  ?Creatinine 0.61 - 1.24 mg/dL 1.23 1.10 1.07  ?Sodium 135 - 145 mmol/L 130(L) 131(L) 131(L)  ?Potassium 3.5 - 5.1 mmol/L 4.0 4.1 4.0  ?  Chloride 98 - 111 mmol/L 103 103 102  ?CO2 22 - 32 mmol/L '22 22 22  '$ ?Calcium 8.9 - 10.3 mg/dL 9.6 9.7 9.3  ?Total Protein 6.5 - 8.1 g/dL 5.1(L) 5.1(L) 5.1(L)  ?Total Bilirubin 0.3 - 1.2 mg/dL 2.1(H) 2.0(H) 2.0(H)  ?Alkaline Phos 38 - 126 U/L 267(H) 273(H) 260(H)  ?AST 15 - 41 U/L 69(H) 65(H) 55(H)   ?ALT 0 - 44 U/L 41 41 34  ? ?CBC ?   ?Component Value Date/Time  ? WBC 11.6 (H) 07/04/2021 0335  ? RBC 2.39 (L) 07/04/2021 0335  ? HGB 7.1 (L) 07/04/2021 0335  ? HGB 7.8 (L) 06/24/2021 1139  ? HCT 22.3 (L) 07/04/2021 0335  ? PLT 144 (L) 07/04/2021 0335  ? PLT 203 06/24/2021 1139  ? MCV 93.3 07/04/2021 0335  ? MCH 29.7 07/04/2021 0335  ? MCHC 31.8 07/04/2021 0335  ? RDW 17.5 (H) 07/04/2021 0335  ? LYMPHSABS 0.9 07/04/2021 0335  ? MONOABS 1.0 07/04/2021 0335  ? EOSABS 0.0 07/04/2021 0335  ? BASOSABS 0.0 07/04/2021 0335  ? ? ? ?Family Communication: Discussed in detail with the wife at the bedside-see above note ? ?Disposition: ?Status is: Inpatient ?Remains inpatient appropriate because: Needs discussion about placement to home hospice freestanding hospice facility ? Planned Discharge Destination:  Likely will go to freestanding hospice ? ? ? ?Author: ?Nita Sells, MD ?07/06/2021 8:53 AM ? ?For on call review [email or web address].  ?

## 2021-07-06 NOTE — Progress Notes (Signed)
Patient scheduled medications not given at this time due to patient sleeping. Wife not at bedside at this time, will attempt once patient is fully awake. ?

## 2021-07-06 NOTE — Progress Notes (Signed)
? ?                                                                                                                                                     ?                                                   ?Daily Progress Note  ? ?Patient Name: Lawrence Hall       Date: 07/06/2021 ?DOB: 10-05-1945  Age: 76 y.o. MRN#: [id number] ?Attending Physician: Nita Sells, MD ?Primary Care Physician: [name], MD ?Admit Date: 06/24/2021 ? ?Reason for Consultation/Follow-up: Establishing goals of care ? ?Subjective: ? Resting in bed, appears weak and chronically ill. Appears less alert, ran into wife in the hallway and we reviewed his current hospital course again. ?   ? ?Length of Stay: 12 ? ?Current Medications: ?Scheduled Meds:  ? Chlorhexidine Gluconate Cloth  6 each Topical Daily  ? feeding supplement  1 Container Oral BID BM  ? feeding supplement  237 mL Oral TID BM  ? metoprolol succinate  100 mg Oral Daily  ? sodium chloride flush  10-40 mL Intracatheter Q12H  ? ? ?Continuous Infusions: ? ? ? ?PRN Meds: ?acetaminophen **OR** acetaminophen, bisacodyl, LORazepam, morphine injection, [DISCONTINUED] ondansetron **OR** ondansetron (ZOFRAN) IV, sodium chloride flush ? ?Physical Exam         ?Appears less alert today ?Shallow regular breath sounds  ?generalized weakness ?Pale. Thin, frail.  ?Abd flat.  ?  ?  ?Vital Signs: BP 124/76 (BP Location: Left Arm)   Pulse 86   Temp 98 ?F (36.7 ?C) (Oral)   Resp 18   Ht 6' (1.829 m)   Wt 66.1 kg   SpO2 100%   BMI 19.76 kg/m?  ?SpO2: SpO2: 100 % ?O2 Device: O2 Device: Room Air (patient refusing nasal cannula at this time) ?O2 Flow Rate: O2 Flow Rate (L/min): 2 L/min ? ?Intake/output summary:  ?Intake/Output Summary (Last 24 hours) at 07/06/2021 1250 ?Last data filed at 07/06/2021 0300 ?Gross per 24 hour  ?Intake --  ?Output 600 ml  ?Net -600 ml  ? ? ?LBM: Last BM Date : 07/06/21 ?Baseline Weight: Weight: 68 kg ?Most recent weight: Weight: 66.1 kg ? ?     ?Palliative  Assessment/Data: ? ? ? ? ? ?Patient Active Problem List  ? Diagnosis Date Noted  ? Pressure injury of skin 07/01/2021  ? Pneumonia 06/30/2021  ? Orthostatic hypotension 06/25/2021  ? Atrial flutter ([place]) 06/24/2021  ? Chronic anemia 06/24/2021  ? Depression 06/24/2021  ? Hyperbilirubinemia 06/24/2021  ? Protein-calorie malnutrition, severe 02/17/2021  ? Hypokalemia 02/17/2021  ? Weakness   ? Dehydration   ?  Syncope and collapse   ? Syncope 02/15/2021  ? Nausea and vomiting 01/24/2021  ? Intractable vomiting with nausea 01/23/2021  ? Anemia 01/23/2021  ? Hyponatremia 01/23/2021  ? Metastasis to bone ([place]) 10/30/2020  ? Goals of care, counseling/discussion 09/06/2020  ? Malignant tumor of renal pelvis, left ([place]) 01/04/2020  ? Port-A-Cath in place 11/02/2019  ? Right rotator cuff tear 09/19/2019  ? History of aortic valve replacement with bioprosthetic valve 05/18/2018  ? Postoperative atrial fibrillation ([place]) 05/18/2018  ? LBBB (left bundle branch block) 05/18/2018  ? S/P CABG x 3 04/29/2018  ? CAD (coronary artery disease), native coronary artery 04/26/2018  ? Near syncope   ? Nonrheumatic aortic valve stenosis   ? Non-STEMI (non-ST elevated myocardial infarction) ([place])   ? Essential hypertension 06/01/2015  ? Asthma 05/31/2015  ? Elevated troponin 05/31/2015  ? Arthritis   ? Hyperlipidemia   ? ED (erectile dysfunction)   ? ? ?Palliative Care Assessment & Plan  ? ?Patient Profile: ?  ? ?Assessment: ? 76 y.o. male  with past medical history of stage IV urothelial cancer mets to liver and bone (no current treatment), renal cell cancer 2021, hypertension, paroxysmal atrial fibrillation, [name], bioprosthetic aortic placement admitted from cancer center infusion center on 06/24/2021 with palpitations and dyspnea related to atrial fibrillation RVR with dehydration.    ?  ? ?Recommendations/Plan: ?DNR ?Undergoing evaluation for hospice.  Continue to support the patient and wife in an interdisciplinary and  multidisciplinary fashion.  Ongoing efforts at evaluation for best possible hospice support.  Patient continues to decline.  Prognosis likely 2 weeks or less for life expectancy in my opinion.  Okay to hold p.o. medications when patient is not awake/alert.  Monitor symptom management needs. ?  ?  ?Code Status: ? ?  ?Code Status Orders  ?(From admission, onward)  ?  ? ? ?  ? ?  Start     Ordered  ? 07/02/21 1338  Do not attempt resuscitation (DNR)  Continuous       ?Question Answer Comment  ?In the event of cardiac or respiratory ARREST Do not call a ?code blue?   ?In the event of cardiac or respiratory ARREST Do not perform Intubation, CPR, defibrillation or ACLS   ?In the event of cardiac or respiratory ARREST Use medication by any route, position, wound care, and other measures to relive pain and suffering. May use oxygen, suction and manual treatment of airway obstruction as needed for comfort.   ?  ? 07/02/21 1337  ? ?  ?  ? ?  ? ?Code Status History   ? ? Date Active Date Inactive Code Status Order ID Comments User Context  ? 06/24/2021 1737 07/02/2021 1337 Full Code [id number]  Tawni Millers, MD Inpatient  ? 02/15/2021 2020 02/22/2021 2333 Full Code [id number]  Lenore Cordia, MD ED  ? 01/23/2021 1555 01/26/2021 1733 Full Code [id number]  Reubin Milan, MD ED  ? 01/04/2020 1421 01/06/2020 1933 Full Code [id number]  Raynelle Bring, MD Inpatient  ? 04/26/2018 1757 05/07/2018 1700 Full Code [id number]  [name] Sine, MD Inpatient  ? 05/31/2015 2139 06/04/2015 1608 Full Code [id number]  Reubin Milan, MD Inpatient  ? ?  ? ? ?Prognosis: ? Guarded, could be less than 2 weeks.  ? ?Discharge Planning: ?Likely for home with hospice or residential hospice. ? ?Care plan was discussed with  patient, wife, inter disciplinary team.  ? ?Thank you for allowing the Palliative Medicine Team to  assist in the care of this patient. ? ? ?Time In: 11 Time Out: 11.25 Total Time 25 Prolonged Time Billed  no   ? ?   ?Greater than  50%  of this time was spent counseling and coordinating care related to the above assessment and plan. ? ?Loistine Chance, MD ? ?Please contact Palliative Medicine Team phone at [phone] for questions and concerns.  ? ? ? ? ? ?

## 2021-07-06 NOTE — Progress Notes (Signed)
Per previous shift, patient was placed on O2 at 3LPM due to patient stating "I can't breathe." When this RN entered room, patient was stating "help, help" and nasal cannula was not in place. This RN attempted to reapply nasal cannula and patient took RN's hand and pushed gently away and stated "no please." Patient remains on room air at this time.  ?

## 2021-07-07 ENCOUNTER — Other Ambulatory Visit: Payer: Self-pay

## 2021-07-07 ENCOUNTER — Telehealth: Payer: PPO

## 2021-07-07 MED ORDER — LORAZEPAM 0.5 MG PO TABS: 0.5000 mg | ORAL_TABLET | ORAL | 0 refills | Status: AC | PRN

## 2021-07-07 MED ORDER — MORPHINE SULFATE 20 MG/5ML PO SOLN: 2.5000 mg | ORAL | 0 refills | Status: AC | PRN

## 2021-07-07 MED ORDER — HEPARIN SOD (PORK) LOCK FLUSH 100 UNIT/ML IV SOLN
500.0000 [IU] | INTRAVENOUS | Status: AC | PRN
  Administered 2021-07-07: 500 [IU]
  Filled 2021-07-07: qty 5

## 2021-07-07 MED ORDER — DILTIAZEM HCL 25 MG/5ML IV SOLN
5.0000 mg | Freq: Once | INTRAVENOUS | Status: AC
  Administered 2021-07-07: 5 mg via INTRAVENOUS
  Filled 2021-07-07: qty 5

## 2021-07-07 NOTE — TOC Transition Note (Signed)
Transition of Care (TOC) - CM/SW Discharge Note ? ? ?Patient Details  ?Name: Lawrence Hall ?MRN: [id number] ?Date of Birth: Jan 27, 1946 ? ?Transition of Care [facility]) CM/SW Contact:  ?Dessa Phi, RN ?Phone Number: ?07/07/2021, 12:40 PM ? ? ?Clinical Narrative: [facility] rep Cheri accepted/has bed available-d/c today to [facility]-DNR in shadow chart for MD signature.Nsg call report tel#[phone]. PTAR called. No further CM needs.   ? ? ? ?Final next level of care: [name] ?Barriers to Discharge: No Barriers Identified ? ? ?Patient Goals and CMS Choice ?Patient states their goals for this hospitalization and ongoing recovery are:: ongoing discussions w/palliative care ?CMS Medicare.gov Compare Post Acute Care list provided to:: Patient ?Choice offered to / list presented to : Patient ? ?Discharge Placement ?  ?           ?Patient chooses bed at: Other - please specify in the comment section below: ([facility]) ?Patient to be transferred to facility by: PTAR ?Name of family member notified: [name] spouse [phone] ?Patient and family notified of of transfer: 07/07/21 ? ?Discharge Plan and Services ?  ?Discharge Planning Services: CM Consult ?           ?  ?  ?  ?  ?  ?  ?  ?  ?  ?  ? ?Social Determinants of Health (SDOH) Interventions ?  ? ? ?Readmission Risk Interventions ?Readmission Risk Prevention Plan 02/22/2021  ?Transportation Screening Complete  ?Medication Review Press photographer) Complete  ?PCP or Specialist appointment within 3-5 days of discharge Complete  ?[place] or Home Care Consult Complete  ?SW Recovery Care/Counseling Consult Complete  ?Palliative Care Screening Complete  ?[place] Not Applicable  ?Some recent data might be hidden  ? ? ? ? ? ?

## 2021-07-07 NOTE — Progress Notes (Signed)
? ?  Pt referral has been approved for services at the Hospice home in [facility]. Wife has accepted bed offer. She will do paperwork with me and TOC has been notified that we can accept pt today. Webb Silversmith RN [phone]  ?

## 2021-07-07 NOTE — Progress Notes (Signed)
? ?                                                                                                                                                     ?                                                   ?Daily Progress Note  ? ?Patient Name: Lawrence Hall       Date: 07/07/2021 ?DOB: 1945/06/26  Age: 76 y.o. MRN#: [id number] ?Attending Physician: Nita Sells, MD ?Primary Care Physician: [name], MD ?Admit Date: 06/24/2021 ? ?Reason for Consultation/Follow-up: Establishing goals of care ? ?Subjective: ? Resting in bed, appears weak and chronically ill. Not awake not alert, appears pale, wife at bedside, patient to receive IV Morphine PRN for pain. Minimal to nil PO intake at this point.   ?   ? ?Length of Stay: 13 ? ?Current Medications: ?Scheduled Meds:  ? Chlorhexidine Gluconate Cloth  6 each Topical Daily  ? feeding supplement  1 Container Oral BID BM  ? feeding supplement  237 mL Oral TID BM  ? metoprolol succinate  100 mg Oral Daily  ? sodium chloride flush  10-40 mL Intracatheter Q12H  ? ? ?Continuous Infusions: ? ? ? ?PRN Meds: ?acetaminophen **OR** acetaminophen, bisacodyl, LORazepam, morphine injection, [DISCONTINUED] ondansetron **OR** ondansetron (ZOFRAN) IV, sodium chloride flush ? ?Physical Exam         ?Not awake not alert ?Shallow regular breath sounds  ?generalized weakness ?Pale. Thin, frail.  ?Abd flat.  ?  ?  ?Vital Signs: BP 114/63   Pulse 78   Temp 98.5 ?F (36.9 ?C) (Oral)   Resp (!) 22   Ht 6' (1.829 m)   Wt 66.1 kg   SpO2 100%   BMI 19.76 kg/m?  ?SpO2: SpO2: 100 % ?O2 Device: O2 Device: Room Air ?O2 Flow Rate: O2 Flow Rate (L/min): 2 L/min ? ?Intake/output summary:  ?Intake/Output Summary (Last 24 hours) at 07/07/2021 1053 ?Last data filed at 07/07/2021 2025 ?Gross per 24 hour  ?Intake 30 ml  ?Output 700 ml  ?Net -670 ml  ? ? ?LBM: Last BM Date : 07/06/21 ?Baseline Weight: Weight: 68 kg ?Most recent weight: Weight: 66.1 kg ? ?     ?Palliative  Assessment/Data: ? ? ? ? ? ?Patient Active Problem List  ? Diagnosis Date Noted  ? Palliative care by specialist   ? General weakness   ? Pressure injury of skin 07/01/2021  ? Pneumonia 06/30/2021  ? Orthostatic hypotension 06/25/2021  ? Atrial flutter ([place]) 06/24/2021  ? Chronic anemia 06/24/2021  ? Depression 06/24/2021  ? Hyperbilirubinemia 06/24/2021  ? Protein-calorie malnutrition, severe 02/17/2021  ?  Hypokalemia 02/17/2021  ? Weakness   ? Dehydration   ? Syncope and collapse   ? Syncope 02/15/2021  ? Nausea and vomiting 01/24/2021  ? Intractable vomiting with nausea 01/23/2021  ? Anemia 01/23/2021  ? Hyponatremia 01/23/2021  ? Metastasis to bone ([place]) 10/30/2020  ? Goals of care, counseling/discussion 09/06/2020  ? Malignant tumor of renal pelvis, left ([place]) 01/04/2020  ? Port-A-Cath in place 11/02/2019  ? Right rotator cuff tear 09/19/2019  ? History of aortic valve replacement with bioprosthetic valve 05/18/2018  ? Postoperative atrial fibrillation ([place]) 05/18/2018  ? LBBB (left bundle branch block) 05/18/2018  ? S/P CABG x 3 04/29/2018  ? CAD (coronary artery disease), native coronary artery 04/26/2018  ? Near syncope   ? Nonrheumatic aortic valve stenosis   ? Non-STEMI (non-ST elevated myocardial infarction) ([place])   ? Essential hypertension 06/01/2015  ? Asthma 05/31/2015  ? Elevated troponin 05/31/2015  ? Arthritis   ? Hyperlipidemia   ? ED (erectile dysfunction)   ? ? ?Palliative Care Assessment & Plan  ? ?Patient Profile: ?  ? ?Assessment: ? 76 y.o. male  with past medical history of stage IV urothelial cancer mets to liver and bone (no current treatment), renal cell cancer 2021, hypertension, paroxysmal atrial fibrillation, [name], bioprosthetic aortic placement [facility] on 06/24/2021 with palpitations and dyspnea related to atrial fibrillation RVR with dehydration.    ?  ? ?Recommendations/Plan: ?DNR ?Undergoing evaluation for hospice.  Continue to support the  patient and wife in an interdisciplinary and multidisciplinary fashion.  Ongoing efforts at evaluation for best possible hospice support.  Patient continues to decline.  Prognosis likely 2 weeks or less for life expectancy in my opinion.  Okay to hold p.o. medications when patient is not awake/alert.  Monitor symptom management needs. ?Now on IV Morphine PRN, recommend residential hospice.  ?  ?  ?Code Status: ? ?  ?Code Status Orders  ?(From admission, onward)  ?  ? ? ?  ? ?  Start     Ordered  ? 07/02/21 1338  Do not attempt resuscitation (DNR)  Continuous       ?Question Answer Comment  ?In the event of cardiac or respiratory ARREST Do not call a ?code blue?   ?In the event of cardiac or respiratory ARREST Do not perform Intubation, CPR, defibrillation or ACLS   ?In the event of cardiac or respiratory ARREST Use medication by any route, position, wound care, and other measures to relive pain and suffering. May use oxygen, suction and manual treatment of airway obstruction as needed for comfort.   ?  ? 07/02/21 1337  ? ?  ?  ? ?  ? ?Code Status History   ? ? Date Active Date Inactive Code Status Order ID Comments User Context  ? 06/24/2021 1737 07/02/2021 1337 Full Code [id number]  Tawni Millers, MD Inpatient  ? 02/15/2021 2020 02/22/2021 2333 Full Code [id number]  Lenore Cordia, MD ED  ? 01/23/2021 1555 01/26/2021 1733 Full Code [id number]  Reubin Milan, MD ED  ? 01/04/2020 1421 01/06/2020 1933 Full Code [id number]  Raynelle Bring, MD Inpatient  ? 04/26/2018 1757 05/07/2018 1700 Full Code [id number]  [name] Sine, MD Inpatient  ? 05/31/2015 2139 06/04/2015 1608 Full Code [id number]  Reubin Milan, MD Inpatient  ? ?  ? ? ?Prognosis: ? Guarded, could be few days to less than 2 weeks.  ? ?Discharge Planning: ?Likely for  residential hospice. ? ?Care  plan was discussed with  patient, wife, inter disciplinary team.  ? ?Thank you for allowing the Palliative Medicine Team to assist in the care of this  patient. ? ? ?Time In: 10 Time Out: 10.25 Total Time 25 Prolonged Time Billed  no   ? ?   ?Greater than 50%  of this time was spent counseling and coordinating care related to the above assessment and plan. ? ?Loistine Chance, MD ? ?Please contact Palliative Medicine Team phone at [phone] for questions and concerns.  ? ? ? ? ? ?

## 2021-07-07 NOTE — Discharge Summary (Signed)
?Physician Discharge Summary ?  ?Patient: Lawrence Hall MRN: [id number] DOB: 03-29-1946  ?Admit date:     06/24/2021  ?Discharge date: 07/07/21  ?Discharge Physician: Nita Sells  ? ?PCP: [name], MD  ? ?Recommendations at discharge:  ? ?Patient will discharge to freestanding hospice facility based on availability today ?Hard scripts of controlled substances were prescribed on discharge ? ?Discharge Diagnoses: ?Principal Problem: ?  Atrial flutter ([place]) ?Active Problems: ?  Hyponatremia ?  Hyperbilirubinemia ?  Hyperlipidemia ?  Essential hypertension ?  CAD (coronary artery disease), native coronary artery ?  Malignant tumor of renal pelvis, left ([facility]) ?  Depression ?  Chronic anemia ?  Orthostatic hypotension ?  Pneumonia ?  Pressure injury of skin ?  Palliative care by specialist ?  General weakness ? ?Resolved Problems: ?  * No resolved hospital problems. * ? ?Hospital Course: ?76 y.o. 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 Hypertension, dyslipidemia, paroxysmal atrial fibrillation, coronary artery disease sp CABG, and bioprosthetic aortic valve replacement.  ?  ?Patient has been very weak and deconditioned, poor mobility due to generalized weakness, poor balance and failure to thrive. He has been treated with weekly IV fluids for hydration per Dr Alen Blew as part of palliative care.  ?Before getting to the infusion center he felt palpitations, moderate in intensity, associated with dyspnea but not chest pain, no improving or worsening factors. ?At the infusion center he was noted to have a rapid heart rate with irregular rhythm and he was transferred to the ED for further evaluation.  ?  ?He was admitted and stabilized and currently palliative care is discussing goals of care in terms of plans for end-of-life with him and his family ?  ? ?Atrial flutter CHADS2 score  >4 ?Continue metoprolol 50 twice daily, p.o. Cardizem (was on Cardizem gtt.)-as needed labetalol IV for intermittent tachycardia  ?No anticoagulation --given history of bleeding ?Telemetry was discontinued 3/18 ?Euvolemic hyponatremia ?Improved--stable in 130 range-stop checking labs given hospice related philosophy at this juncture ?Possible pneumonia since 3/13 ?Patient was on azithromycin and ceftriaxone-antibiotics discontinued 3/18 ?It is not expected that this patient will survive beyond 2-4 weeks ?High-grade urothelial stage IV CA with hepatic and bony mets ?Outpatient IV fluids being given q. Weekly ?Chronic anemia ?now DNR-see detailed discussion by Ms. Jerline Pain. ?consolidated this discussion with patient and wife at the bedside 3/15 ?metastatic disease is evidenced by elevations of LFTs ?Appreciative of Dr. Alen Blew, Dr. Rowe Pavy and Dr. Orene Desanctis input ?Patient has intermittent confusion-wife who is surrogate decision maker has decided on freestanding hospice if available ?CABG 2020 ?HFrEF this admission 40% orthostatic hypotension ?Cardiology evaluated patient he is not a candidate for invasive work-up-greatly appreciate bedside visit on 3/18 by his primary cardiologist Dr. Orene Desanctis ?Depression ?Depressed but much more awake with discontinuation orf Trazadone/Remeron ?Consider discontinuation of Megace ? ? ?  ? ? ?Consultants: Palliative care, multiple other ?Procedures performed: Multiple ?Disposition: Hospice care ?Diet recommendation:  ?Discharge Diet Orders (From admission, onward)  ? ?  Start     Ordered  ? 07/07/21 0000  Diet - low sodium heart healthy       ? 07/07/21 0803  ? ?  ?  ? ?  ? ?NPO comfort feeds ?DISCHARGE MEDICATION: ?Allergies as of 07/07/2021   ? ?   Reactions  ? Other Itching, Other (See Comments)  ? Pet dander, seasonal allergies = Runny nose,  itchy eyes  ? ?  ? ?  ?Medication List  ?  ? ?STOP taking these medications   ? ?aspirin EC 81 MG tablet ?  ?atorvastatin 80 MG tablet ?Commonly  known as: LIPITOR ?  ?megestrol 625 MG/5ML suspension ?Commonly known as: MEGACE ES ?  ?metoprolol succinate 50 MG 24 hr tablet ?Commonly known as: TOPROL-XL ?  ?Milk Thistle 1000 MG Caps ?  ?mirtazapine 15 MG tablet ?Commonly known as: REMERON ?  ?nitroGLYCERIN 0.4 MG SL tablet ?Commonly known as: NITROSTAT ?  ?oxyCODONE 5 MG immediate release tablet ?Commonly known as: Oxy IR/ROXICODONE ?  ?prochlorperazine 10 MG tablet ?Commonly known as: COMPAZINE ?  ?Vitamin D 50 MCG (2000 UT) tablet ?  ? ?  ? ?TAKE these medications   ? ?LORazepam 0.5 MG tablet ?Commonly known as: ATIVAN ?Take 1 tablet (0.5 mg total) by mouth every 4 (four) hours as needed for anxiety. ?  ?morphine 20 MG/5ML solution ?Take 0.6 mLs (2.4 mg total) by mouth every 3 (three) hours as needed for up to 3 days for pain. ?  ?polyethylene glycol 17 g packet ?Commonly known as: MIRALAX / GLYCOLAX ?Take 17 g by mouth daily. ?What changed:  ?when to take this ?reasons to take this ?  ? ?  ? ? Follow-up Information   ? ? [name], Mihai, MD Follow up on 07/11/2021.   ?Specialty: Cardiology ?Why: Please arrive 15 minutes early for your 8am post-hospital cardiology appointment. ?Contact information: ?[address] ?[address] ?[place] [address] ?[phone] ? ? ?  ?  ? ?  ?  ? ?  ? ?Discharge Exam: ?Filed Weights  ? 06/24/21 1349 06/24/21 1800  ?Weight: 68 kg 66.1 kg  ? ?Listless somewhat awake but not completely and quite sleepy-I do not think he is eaten anything overnight ?His mucosa is dry ?S1-S2 tachycardic in the 100 range ?Abdomen is soft no rebound no guarding ?Neurologically intact to power but purposeless movement ? ?Condition at discharge: serious ? ?The results of significant diagnostics from this hospitalization (including imaging, microbiology, ancillary and laboratory) are listed below for reference.  ? ?Imaging Studies: ?CT HEAD WO CONTRAST (5MM) ? ?Result Date: 06/28/2021 ?CLINICAL DATA:  Mental status change. Unknown cause. Metastases  suspected. Renal carcinoma. EXAM: CT HEAD WITHOUT CONTRAST TECHNIQUE: Contiguous axial images were obtained from the base of the skull through the vertex without intravenous contrast. RADIATION DOSE REDUCTION: This exam was performed according to the departmental dose-optimization program which includes automated exposure control, adjustment of the mA and/or kV according to patient size and/or use of iterative reconstruction technique. COMPARISON:  None. BRAIN: BRAIN Patchy and confluent areas of decreased attenuation are noted throughout the deep and periventricular white matter of the cerebral hemispheres bilaterally, compatible with chronic microvascular ischemic disease. No evidence of large-territorial acute infarction. No parenchymal hemorrhage. No mass lesion. No extra-axial collection. No mass effect or midline shift. No hydrocephalus. Basilar cisterns are patent. Vascular: No hyperdense vessel. Skull: No acute fracture or focal lesion. Sinuses/Orbits: Paranasal sinuses and mastoid air cells are clear. Bilateral lens replacement. Otherwise orbits are unremarkable. Other: None. IMPRESSION: No acute intracranial abnormality. Please note CT is not sensitive for metastases such as MRI. Electronically Signed   By: Iven Finn M.D.   On: 06/28/2021 18:41  ? ?DG CHEST PORT 1 VIEW ? ?Result Date: 06/30/2021 ?CLINICAL DATA:  Fever R50.9 (ICD-10-CM). Hx: Asthma, HTN, MI (2017) EXAM: PORTABLE CHEST 1 VIEW COMPARISON:  June 24, 2021. FINDINGS: Increased bibasilar opacities. Possible small  bilateral layering pleural effusions. No visible pneumothorax on this semi erect radiograph. Right-sided skin folds. Right IJ Port-A-Cath with the tip projecting at the superior right atrium, similar. Similar cardiomediastinal silhouette with cardiac valve replacement and median sternotomy. Partially imaged right shoulder hardware. IMPRESSION: Increased bibasilar opacities, which could represent atelectasis, aspiration and/or  pneumonia. Possible small bilateral layering pleural effusions. Electronically Signed   By: Margaretha Sheffield M.D.   On: 06/30/2021 10:16  ? ?DG Chest Portable 1 View ? ?Result Date: 06/24/2021 ?CLINICAL DATA:  Donney Dice

## 2021-07-07 NOTE — Progress Notes (Signed)
Patient heart rate sustaining in 150's. EKG showed A-fib with RVR. On call provider made aware. New order placed. Will update oncoming nurse. ?

## 2021-07-07 NOTE — Plan of Care (Signed)
  Problem: Pain Managment: Goal: General experience of comfort will improve Outcome: Progressing   Problem: Safety: Goal: Ability to remain free from injury will improve Outcome: Progressing   

## 2021-07-07 NOTE — Progress Notes (Signed)
Patient transported to [facility] via [facility]. Report called to the nurse at facility. Patient in no distress at discharge.  ?

## 2021-07-07 NOTE — Progress Notes (Signed)
PT Cancellation Note ? ?Patient Details ?Name: Lawrence Hall ?MRN: [id number] ?DOB: 02/22/1946 ? ? ?Cancelled Treatment:    Reason Eval/Treat Not Completed: Other (comment) (pt is transitioning to Hospice/comfort measures) PT signing off. ? ? ?Adryen Cookson ?07/07/2021, 9:11 AM ?

## 2021-07-07 NOTE — Progress Notes (Signed)
?   07/07/21 0700  ?Assess: if the MEWS score is Yellow or Red  ?Were vital signs taken at a resting state? Yes  ?Focused Assessment No change from prior assessment  ?MEWS guidelines implemented *See Row Information* No, other (Comment) ?(pt admitted with a-fib rvr)  ?Notify: Charge Nurse/RN  ?Name of Charge Nurse/RN Notified Lauren rn  ?Date Charge Nurse/RN Notified 07/07/21  ?Time Charge Nurse/RN Notified [phone]  ?Notify: Provider  ?Provider Name/Title [facility]  ?Date Provider Notified 07/07/21  ?Time Provider Notified [phone]  ?Notification Type  ?(secure chat)  ?Notification Reason Other (Comment) ?(Hr 150's)  ?Provider response See new orders  ?Date of Provider Response 07/07/21  ?Time of Provider Response [phone]  ? ? ?

## 2021-07-07 NOTE — TOC Progression Note (Addendum)
Transition of Care (TOC) - Progression Note  ? ? ?Patient Details  ?Name: Lawrence Hall ?MRN: [id number] ?Date of Birth: [birth year] ? ?Transition of Care (TOC) CM/SW Contact  ?Dessa Phi, RN ?Phone Number: ?07/07/2021, 9:12 AM ? ?Clinical Narrative:    checking if bed available. per note yesterday they were anticipating d/c today w/residential hospice-[facility].Will confirm ?[facility] await their MD approval.. ? ? ? ?Expected Discharge Plan:  (TBD) ?Barriers to Discharge: Continued Medical Work up ? ?Expected Discharge Plan and Services ?Expected Discharge Plan:  (TBD) ?  ?Discharge Planning Services: CM Consult ?  ?Living arrangements for the past 2 months: [place] ?Expected Discharge Date: 07/07/21               ?  ?  ?  ?  ?  ?  ?  ?  ?  ?  ? ? ?Social Determinants of Health (SDOH) Interventions ?  ? ?Readmission Risk Interventions ?Readmission Risk Prevention Plan 02/22/2021  ?Transportation Screening Complete  ?Medication Review Press photographer) Complete  ?PCP or Specialist appointment within 3-5 days of discharge Complete  ?[place] or Home Care Consult Complete  ?SW Recovery Care/Counseling Consult Complete  ?Palliative Care Screening Complete  ?[place] Not Applicable  ?Some recent data might be hidden  ? ? ?

## 2021-07-08 ENCOUNTER — Inpatient Hospital Stay: Payer: PPO

## 2021-07-11 ENCOUNTER — Ambulatory Visit: Payer: PPO | Admitting: Cardiovascular Disease

## 2021-07-15 ENCOUNTER — Ambulatory Visit: Payer: PPO

## 2021-07-15 ENCOUNTER — Other Ambulatory Visit: Payer: PPO

## 2021-07-19 NOTE — Telephone Encounter (Signed)
LMTCB

## 2021-07-19 DEATH — deceased

## 2023-02-02 NOTE — Telephone Encounter (Signed)
TC

## 2023-03-10 NOTE — Telephone Encounter (Signed)
Telephone call  

## 2023-03-11 NOTE — Telephone Encounter (Signed)
Telephone call
